# Patient Record
Sex: Female | Born: 1945 | Race: White | Hispanic: No | Marital: Married | Smoking: Never smoker
Health system: Southern US, Community
[De-identification: ages and names within clinical notes are randomized; demographics above are authoritative.]

## PROBLEM LIST (undated history)

## (undated) DIAGNOSIS — L719 Rosacea, unspecified: Secondary | ICD-10-CM

## (undated) DIAGNOSIS — M8580 Other specified disorders of bone density and structure, unspecified site: Secondary | ICD-10-CM

## (undated) DIAGNOSIS — K5790 Diverticulosis of intestine, part unspecified, without perforation or abscess without bleeding: Secondary | ICD-10-CM

## (undated) DIAGNOSIS — I080 Rheumatic disorders of both mitral and aortic valves: Secondary | ICD-10-CM

## (undated) DIAGNOSIS — Z8679 Personal history of other diseases of the circulatory system: Secondary | ICD-10-CM

## (undated) DIAGNOSIS — I43 Cardiomyopathy in diseases classified elsewhere: Secondary | ICD-10-CM

## (undated) DIAGNOSIS — F419 Anxiety disorder, unspecified: Secondary | ICD-10-CM

## (undated) DIAGNOSIS — G43909 Migraine, unspecified, not intractable, without status migrainosus: Secondary | ICD-10-CM

## (undated) DIAGNOSIS — E039 Hypothyroidism, unspecified: Secondary | ICD-10-CM

## (undated) DIAGNOSIS — R32 Unspecified urinary incontinence: Secondary | ICD-10-CM

## (undated) DIAGNOSIS — I4891 Unspecified atrial fibrillation: Secondary | ICD-10-CM

## (undated) DIAGNOSIS — I639 Cerebral infarction, unspecified: Secondary | ICD-10-CM

## (undated) DIAGNOSIS — T460X1A Poisoning by cardiac-stimulant glycosides and drugs of similar action, accidental (unintentional), initial encounter: Secondary | ICD-10-CM

## (undated) DIAGNOSIS — F32A Depression, unspecified: Secondary | ICD-10-CM

## (undated) DIAGNOSIS — R000 Tachycardia, unspecified: Principal | ICD-10-CM

## (undated) DIAGNOSIS — F329 Major depressive disorder, single episode, unspecified: Secondary | ICD-10-CM

## (undated) DIAGNOSIS — I5042 Chronic combined systolic (congestive) and diastolic (congestive) heart failure: Secondary | ICD-10-CM

## (undated) DIAGNOSIS — E785 Hyperlipidemia, unspecified: Secondary | ICD-10-CM

## (undated) DIAGNOSIS — I480 Paroxysmal atrial fibrillation: Secondary | ICD-10-CM

## (undated) DIAGNOSIS — J45909 Unspecified asthma, uncomplicated: Secondary | ICD-10-CM

## (undated) DIAGNOSIS — I4892 Unspecified atrial flutter: Secondary | ICD-10-CM

## (undated) HISTORY — DX: Diverticulosis of intestine, part unspecified, without perforation or abscess without bleeding: K57.90

## (undated) HISTORY — DX: Unspecified atrial fibrillation: I48.91

## (undated) HISTORY — DX: Unspecified urinary incontinence: R32

## (undated) HISTORY — DX: Unspecified asthma, uncomplicated: J45.909

## (undated) HISTORY — DX: Chronic combined systolic (congestive) and diastolic (congestive) heart failure: I50.42

## (undated) HISTORY — DX: Hyperlipidemia, unspecified: E78.5

## (undated) HISTORY — DX: Anxiety disorder, unspecified: F41.9

## (undated) HISTORY — DX: Hypothyroidism, unspecified: E03.9

## (undated) HISTORY — DX: Poisoning by cardiac-stimulant glycosides and drugs of similar action, accidental (unintentional), initial encounter: T46.0X1A

## (undated) HISTORY — PX: ABDOMINAL HYSTERECTOMY: SHX81

## (undated) HISTORY — DX: Other specified disorders of bone density and structure, unspecified site: M85.80

## (undated) HISTORY — DX: Tachycardia, unspecified: R00.0

## (undated) HISTORY — PX: THYROIDECTOMY, PARTIAL: SHX18

## (undated) HISTORY — DX: Paroxysmal atrial fibrillation: I48.0

## (undated) HISTORY — PX: TRANSTHORACIC ECHOCARDIOGRAM: SHX275

## (undated) HISTORY — PX: NM MYOVIEW LTD: HXRAD82

## (undated) HISTORY — DX: Depression, unspecified: F32.A

## (undated) HISTORY — DX: Cerebral infarction, unspecified: I63.9

## (undated) HISTORY — DX: Cardiomyopathy in diseases classified elsewhere: I43

## (undated) HISTORY — DX: Migraine, unspecified, not intractable, without status migrainosus: G43.909

## (undated) HISTORY — PX: LUMBAR DISC SURGERY: SHX700

## (undated) HISTORY — DX: Personal history of other diseases of the circulatory system: Z86.79

## (undated) HISTORY — DX: Major depressive disorder, single episode, unspecified: F32.9

## (undated) HISTORY — DX: Unspecified atrial flutter: I48.92

## (undated) HISTORY — DX: Rosacea, unspecified: L71.9

## (undated) HISTORY — DX: Rheumatic disorders of both mitral and aortic valves: I08.0

---

## 2003-08-30 DIAGNOSIS — I080 Rheumatic disorders of both mitral and aortic valves: Secondary | ICD-10-CM

## 2003-08-30 HISTORY — PX: MITRAL VALVE REPAIR: SHX2039

## 2003-08-30 HISTORY — PX: CARDIAC CATHETERIZATION: SHX172

## 2003-08-30 HISTORY — DX: Rheumatic disorders of both mitral and aortic valves: I08.0

## 2003-09-12 ENCOUNTER — Ambulatory Visit (HOSPITAL_COMMUNITY): Admission: RE | Admit: 2003-09-12 | Discharge: 2003-09-12 | Payer: Self-pay | Admitting: Cardiovascular Disease

## 2003-09-16 ENCOUNTER — Inpatient Hospital Stay (HOSPITAL_BASED_OUTPATIENT_CLINIC_OR_DEPARTMENT_OTHER): Admission: RE | Admit: 2003-09-16 | Discharge: 2003-09-16 | Payer: Self-pay | Admitting: Cardiology

## 2003-10-15 ENCOUNTER — Inpatient Hospital Stay (HOSPITAL_COMMUNITY)
Admission: RE | Admit: 2003-10-15 | Discharge: 2003-10-22 | Payer: Self-pay | Admitting: Thoracic Surgery (Cardiothoracic Vascular Surgery)

## 2003-10-15 ENCOUNTER — Encounter (INDEPENDENT_AMBULATORY_CARE_PROVIDER_SITE_OTHER): Payer: Self-pay | Admitting: *Deleted

## 2003-12-08 ENCOUNTER — Encounter (HOSPITAL_COMMUNITY): Admission: RE | Admit: 2003-12-08 | Discharge: 2004-03-07 | Payer: Self-pay | Admitting: Cardiology

## 2004-02-17 ENCOUNTER — Encounter
Admission: RE | Admit: 2004-02-17 | Discharge: 2004-02-17 | Payer: Self-pay | Admitting: Thoracic Surgery (Cardiothoracic Vascular Surgery)

## 2004-03-12 ENCOUNTER — Ambulatory Visit (HOSPITAL_COMMUNITY): Admission: RE | Admit: 2004-03-12 | Discharge: 2004-03-12 | Payer: Self-pay | Admitting: Internal Medicine

## 2004-10-05 ENCOUNTER — Ambulatory Visit: Payer: Self-pay

## 2004-11-04 ENCOUNTER — Ambulatory Visit: Payer: Self-pay | Admitting: Cardiology

## 2004-11-16 ENCOUNTER — Ambulatory Visit: Payer: Self-pay

## 2004-12-13 ENCOUNTER — Ambulatory Visit: Payer: Self-pay | Admitting: Cardiology

## 2005-08-15 ENCOUNTER — Ambulatory Visit: Payer: Self-pay | Admitting: Cardiology

## 2006-06-07 ENCOUNTER — Ambulatory Visit: Payer: Self-pay | Admitting: Cardiology

## 2006-06-30 ENCOUNTER — Ambulatory Visit: Payer: Self-pay

## 2006-06-30 ENCOUNTER — Encounter: Payer: Self-pay | Admitting: Internal Medicine

## 2006-08-24 ENCOUNTER — Ambulatory Visit: Payer: Self-pay

## 2006-09-08 ENCOUNTER — Ambulatory Visit: Payer: Self-pay | Admitting: Gastroenterology

## 2006-09-08 LAB — PROTIME-INR

## 2007-01-09 ENCOUNTER — Ambulatory Visit: Payer: Self-pay | Admitting: Cardiology

## 2007-07-23 ENCOUNTER — Ambulatory Visit: Payer: Self-pay | Admitting: Cardiology

## 2007-10-01 ENCOUNTER — Encounter: Payer: Self-pay | Admitting: Family Medicine

## 2007-10-12 ENCOUNTER — Ambulatory Visit: Payer: Self-pay | Admitting: Family Medicine

## 2008-07-04 ENCOUNTER — Ambulatory Visit: Payer: Self-pay | Admitting: Cardiology

## 2008-08-05 ENCOUNTER — Ambulatory Visit: Payer: Self-pay

## 2008-08-05 ENCOUNTER — Encounter: Payer: Self-pay | Admitting: Cardiology

## 2008-10-09 ENCOUNTER — Ambulatory Visit: Payer: Self-pay | Admitting: Cardiology

## 2008-11-18 ENCOUNTER — Ambulatory Visit: Payer: Self-pay | Admitting: Family Medicine

## 2008-11-18 DIAGNOSIS — R519 Headache, unspecified: Secondary | ICD-10-CM | POA: Insufficient documentation

## 2008-11-18 DIAGNOSIS — N3946 Mixed incontinence: Secondary | ICD-10-CM | POA: Insufficient documentation

## 2008-11-18 DIAGNOSIS — J45909 Unspecified asthma, uncomplicated: Secondary | ICD-10-CM | POA: Insufficient documentation

## 2008-11-18 DIAGNOSIS — F411 Generalized anxiety disorder: Secondary | ICD-10-CM | POA: Insufficient documentation

## 2008-11-18 DIAGNOSIS — Z87442 Personal history of urinary calculi: Secondary | ICD-10-CM | POA: Insufficient documentation

## 2008-11-18 DIAGNOSIS — I4891 Unspecified atrial fibrillation: Secondary | ICD-10-CM | POA: Insufficient documentation

## 2008-11-18 DIAGNOSIS — E039 Hypothyroidism, unspecified: Secondary | ICD-10-CM | POA: Insufficient documentation

## 2008-11-18 DIAGNOSIS — R51 Headache: Secondary | ICD-10-CM | POA: Insufficient documentation

## 2008-11-18 DIAGNOSIS — F331 Major depressive disorder, recurrent, moderate: Secondary | ICD-10-CM | POA: Insufficient documentation

## 2008-11-25 ENCOUNTER — Ambulatory Visit: Payer: Self-pay | Admitting: Professional

## 2008-12-02 ENCOUNTER — Ambulatory Visit: Payer: Self-pay | Admitting: Family Medicine

## 2008-12-02 LAB — CONVERTED CEMR LAB
INR: 1.2
Prothrombin Time: 13.8 s

## 2008-12-08 ENCOUNTER — Ambulatory Visit: Payer: Self-pay | Admitting: Family Medicine

## 2008-12-08 LAB — CONVERTED CEMR LAB: Prothrombin Time: 20 s

## 2008-12-09 ENCOUNTER — Ambulatory Visit: Payer: Self-pay | Admitting: Professional

## 2008-12-15 ENCOUNTER — Ambulatory Visit: Payer: Self-pay | Admitting: Family Medicine

## 2008-12-15 LAB — CONVERTED CEMR LAB: INR: 1.7

## 2008-12-16 ENCOUNTER — Ambulatory Visit: Payer: Self-pay | Admitting: Professional

## 2008-12-22 ENCOUNTER — Ambulatory Visit: Payer: Self-pay | Admitting: Family Medicine

## 2008-12-22 LAB — CONVERTED CEMR LAB: INR: 2.9

## 2008-12-25 ENCOUNTER — Encounter (INDEPENDENT_AMBULATORY_CARE_PROVIDER_SITE_OTHER): Payer: Self-pay | Admitting: *Deleted

## 2008-12-29 ENCOUNTER — Ambulatory Visit: Payer: Self-pay | Admitting: Family Medicine

## 2008-12-30 LAB — CONVERTED CEMR LAB
INR: 4.1 — ABNORMAL HIGH (ref 0.0–1.5)
Prothrombin Time: 43.5 s — ABNORMAL HIGH (ref 11.6–15.2)

## 2009-01-05 ENCOUNTER — Ambulatory Visit: Payer: Self-pay | Admitting: Family Medicine

## 2009-01-05 LAB — CONVERTED CEMR LAB
INR: 1.7
Prothrombin Time: 16.3 s

## 2009-01-12 ENCOUNTER — Ambulatory Visit: Payer: Self-pay | Admitting: Family Medicine

## 2009-01-12 LAB — CONVERTED CEMR LAB: Prothrombin Time: 16.9 s

## 2009-01-20 ENCOUNTER — Ambulatory Visit: Payer: Self-pay | Admitting: Family Medicine

## 2009-01-20 DIAGNOSIS — IMO0002 Reserved for concepts with insufficient information to code with codable children: Secondary | ICD-10-CM | POA: Insufficient documentation

## 2009-01-28 ENCOUNTER — Telehealth: Payer: Self-pay | Admitting: Family Medicine

## 2009-01-28 ENCOUNTER — Ambulatory Visit: Payer: Self-pay | Admitting: Family Medicine

## 2009-01-28 LAB — CONVERTED CEMR LAB: INR: 2.9

## 2009-02-04 ENCOUNTER — Telehealth: Payer: Self-pay | Admitting: Family Medicine

## 2009-02-10 ENCOUNTER — Ambulatory Visit: Payer: Self-pay | Admitting: Professional

## 2009-02-13 ENCOUNTER — Encounter: Payer: Self-pay | Admitting: Family Medicine

## 2009-02-13 ENCOUNTER — Ambulatory Visit: Payer: Self-pay | Admitting: Family Medicine

## 2009-02-16 ENCOUNTER — Encounter (INDEPENDENT_AMBULATORY_CARE_PROVIDER_SITE_OTHER): Payer: Self-pay | Admitting: *Deleted

## 2009-02-24 ENCOUNTER — Ambulatory Visit: Payer: Self-pay | Admitting: Family Medicine

## 2009-03-03 ENCOUNTER — Ambulatory Visit: Payer: Self-pay | Admitting: Family Medicine

## 2009-03-03 LAB — CONVERTED CEMR LAB
INR: 1.2
Prothrombin Time: 13.8 s

## 2009-03-10 ENCOUNTER — Ambulatory Visit: Payer: Self-pay | Admitting: Family Medicine

## 2009-03-10 LAB — CONVERTED CEMR LAB
INR: 2.2
INR: 2.6 — ABNORMAL HIGH (ref 0.8–1.0)
Prothrombin Time: 18.1 s
Prothrombin Time: 26.5 s — ABNORMAL HIGH (ref 10.9–13.3)

## 2009-03-16 ENCOUNTER — Telehealth: Payer: Self-pay | Admitting: Family Medicine

## 2009-04-06 ENCOUNTER — Ambulatory Visit: Payer: Self-pay | Admitting: Family Medicine

## 2009-04-14 ENCOUNTER — Telehealth: Payer: Self-pay | Admitting: Family Medicine

## 2009-04-27 ENCOUNTER — Ambulatory Visit: Payer: Self-pay | Admitting: Family Medicine

## 2009-05-06 ENCOUNTER — Encounter (INDEPENDENT_AMBULATORY_CARE_PROVIDER_SITE_OTHER): Payer: Self-pay | Admitting: *Deleted

## 2009-05-07 ENCOUNTER — Ambulatory Visit: Payer: Self-pay | Admitting: Family Medicine

## 2009-05-14 ENCOUNTER — Encounter (INDEPENDENT_AMBULATORY_CARE_PROVIDER_SITE_OTHER): Payer: Self-pay | Admitting: *Deleted

## 2009-05-14 LAB — CONVERTED CEMR LAB
ALT: 25 units/L (ref 0–35)
AST: 25 units/L (ref 0–37)
Alkaline Phosphatase: 61 units/L (ref 39–117)
Basophils Absolute: 0 10*3/uL (ref 0.0–0.1)
Calcium: 9.4 mg/dL (ref 8.4–10.5)
Eosinophils Relative: 2.3 % (ref 0.0–5.0)
GFR calc non Af Amer: 76.6 mL/min (ref >60)
HCT: 40.3 % (ref 36.0–46.0)
HDL: 61.2 mg/dL (ref >39.00)
Hemoglobin: 13.7 g/dL (ref 12.0–15.0)
Lymphocytes Relative: 36 % (ref 12.0–46.0)
Lymphs Abs: 1.4 10*3/uL (ref 0.7–4.0)
Monocytes Relative: 7.6 % (ref 3.0–12.0)
Neutro Abs: 2.2 10*3/uL (ref 1.4–7.7)
Platelets: 202 10*3/uL (ref 150.0–400.0)
Potassium: 4.7 meq/L (ref 3.5–5.1)
RDW: 12.5 % (ref 11.5–14.6)
Sodium: 141 meq/L (ref 135–145)
TSH: 1.49 microintl units/mL (ref 0.35–5.50)
Total Bilirubin: 0.9 mg/dL (ref 0.3–1.2)
Total CHOL/HDL Ratio: 3
Triglycerides: 80 mg/dL (ref 0.0–149.0)
VLDL: 16 mg/dL (ref 0.0–40.0)
WBC: 4 10*3/uL — ABNORMAL LOW (ref 4.5–10.5)

## 2009-05-15 ENCOUNTER — Ambulatory Visit: Payer: Self-pay | Admitting: Family Medicine

## 2009-05-15 LAB — CONVERTED CEMR LAB: Prothrombin Time: 24.4 s

## 2009-05-22 ENCOUNTER — Ambulatory Visit: Payer: Self-pay | Admitting: Family Medicine

## 2009-05-22 LAB — CONVERTED CEMR LAB: INR: 3.3

## 2009-06-05 ENCOUNTER — Ambulatory Visit: Payer: Self-pay | Admitting: Family Medicine

## 2009-06-05 LAB — CONVERTED CEMR LAB
INR: 1.7
Prothrombin Time: 15.9 s

## 2009-07-03 ENCOUNTER — Ambulatory Visit: Payer: Self-pay | Admitting: Family Medicine

## 2009-07-27 ENCOUNTER — Ambulatory Visit: Payer: Self-pay | Admitting: Cardiology

## 2009-07-27 DIAGNOSIS — I058 Other rheumatic mitral valve diseases: Secondary | ICD-10-CM | POA: Insufficient documentation

## 2009-07-31 ENCOUNTER — Ambulatory Visit: Payer: Self-pay | Admitting: Family Medicine

## 2009-09-07 ENCOUNTER — Ambulatory Visit: Payer: Self-pay | Admitting: Family Medicine

## 2009-09-07 LAB — CONVERTED CEMR LAB: INR: 2.1

## 2009-09-09 ENCOUNTER — Telehealth: Payer: Self-pay | Admitting: Family Medicine

## 2009-09-17 ENCOUNTER — Ambulatory Visit: Payer: Self-pay | Admitting: Family Medicine

## 2009-09-18 ENCOUNTER — Telehealth: Payer: Self-pay | Admitting: Family Medicine

## 2009-09-18 LAB — CONVERTED CEMR LAB
Free T4: 0.9 ng/dL (ref 0.6–1.6)
T4, Total: 8.1 ug/dL (ref 5.0–12.5)
TSH: 1.88 microintl units/mL (ref 0.35–5.50)

## 2009-09-23 ENCOUNTER — Ambulatory Visit: Payer: Self-pay | Admitting: Family Medicine

## 2009-10-05 ENCOUNTER — Ambulatory Visit: Payer: Self-pay | Admitting: Family Medicine

## 2009-10-05 LAB — CONVERTED CEMR LAB
INR: 2
Prothrombin Time: 17.5 s

## 2009-10-19 ENCOUNTER — Telehealth (INDEPENDENT_AMBULATORY_CARE_PROVIDER_SITE_OTHER): Payer: Self-pay | Admitting: *Deleted

## 2009-11-02 ENCOUNTER — Ambulatory Visit: Payer: Self-pay | Admitting: Family Medicine

## 2009-11-09 ENCOUNTER — Telehealth: Payer: Self-pay | Admitting: Cardiology

## 2009-11-12 ENCOUNTER — Ambulatory Visit: Payer: Self-pay | Admitting: Family Medicine

## 2009-11-26 ENCOUNTER — Ambulatory Visit: Payer: Self-pay | Admitting: Family Medicine

## 2009-12-09 ENCOUNTER — Ambulatory Visit: Payer: Self-pay | Admitting: Family Medicine

## 2009-12-10 ENCOUNTER — Telehealth: Payer: Self-pay | Admitting: Family Medicine

## 2009-12-16 ENCOUNTER — Ambulatory Visit: Payer: Self-pay | Admitting: Family Medicine

## 2009-12-16 ENCOUNTER — Telehealth: Payer: Self-pay | Admitting: Family Medicine

## 2009-12-16 LAB — CONVERTED CEMR LAB
INR: 3.1
Prothrombin Time: 37.8 s

## 2009-12-30 ENCOUNTER — Ambulatory Visit: Payer: Self-pay | Admitting: Family Medicine

## 2009-12-30 LAB — CONVERTED CEMR LAB
INR: 2.5
Prothrombin Time: 29.9 s

## 2010-01-04 ENCOUNTER — Ambulatory Visit: Payer: Self-pay | Admitting: Cardiology

## 2010-01-06 ENCOUNTER — Telehealth: Payer: Self-pay | Admitting: Family Medicine

## 2010-01-14 ENCOUNTER — Ambulatory Visit: Payer: Self-pay | Admitting: Family Medicine

## 2010-01-14 ENCOUNTER — Telehealth: Payer: Self-pay | Admitting: Family Medicine

## 2010-01-15 ENCOUNTER — Telehealth: Payer: Self-pay | Admitting: Family Medicine

## 2010-01-18 ENCOUNTER — Telehealth: Payer: Self-pay | Admitting: Family Medicine

## 2010-01-19 ENCOUNTER — Encounter: Payer: Self-pay | Admitting: Family Medicine

## 2010-01-27 ENCOUNTER — Ambulatory Visit: Payer: Self-pay | Admitting: Family Medicine

## 2010-01-27 LAB — CONVERTED CEMR LAB
INR: 1.7
Prothrombin Time: 20.4 s

## 2010-02-10 ENCOUNTER — Ambulatory Visit: Payer: Self-pay | Admitting: Family Medicine

## 2010-02-10 LAB — CONVERTED CEMR LAB: INR: 1.5

## 2010-02-24 ENCOUNTER — Ambulatory Visit: Payer: Self-pay | Admitting: Family Medicine

## 2010-02-24 LAB — CONVERTED CEMR LAB: INR: 2.1

## 2010-03-02 ENCOUNTER — Telehealth: Payer: Self-pay | Admitting: Family Medicine

## 2010-03-24 ENCOUNTER — Telehealth: Payer: Self-pay | Admitting: Family Medicine

## 2010-03-24 ENCOUNTER — Ambulatory Visit: Payer: Self-pay | Admitting: Family Medicine

## 2010-03-25 ENCOUNTER — Telehealth: Payer: Self-pay | Admitting: Family Medicine

## 2010-03-30 ENCOUNTER — Telehealth: Payer: Self-pay | Admitting: Family Medicine

## 2010-03-31 ENCOUNTER — Ambulatory Visit: Payer: Self-pay | Admitting: Family Medicine

## 2010-03-31 DIAGNOSIS — R5381 Other malaise: Secondary | ICD-10-CM | POA: Insufficient documentation

## 2010-03-31 DIAGNOSIS — R5383 Other fatigue: Secondary | ICD-10-CM

## 2010-04-02 LAB — CONVERTED CEMR LAB
BUN: 13 mg/dL (ref 6–23)
Bilirubin, Direct: 0.1 mg/dL (ref 0.0–0.3)
Calcium: 9.3 mg/dL (ref 8.4–10.5)
Chloride: 104 meq/L (ref 96–112)
Cholesterol: 217 mg/dL — ABNORMAL HIGH (ref 0–200)
Creatinine, Ser: 0.8 mg/dL (ref 0.4–1.2)
Direct LDL: 132.3 mg/dL
Eosinophils Absolute: 0.1 10*3/uL (ref 0.0–0.7)
Eosinophils Relative: 1 % (ref 0.0–5.0)
HDL: 47.9 mg/dL (ref >39.00)
Lymphocytes Relative: 23.5 % (ref 12.0–46.0)
MCV: 98.5 fL (ref 78.0–100.0)
Monocytes Absolute: 0.3 10*3/uL (ref 0.1–1.0)
Neutrophils Relative %: 69.9 % (ref 43.0–77.0)
Platelets: 199 10*3/uL (ref 150.0–400.0)
T4, Total: 7 ug/dL (ref 5.0–12.5)
Total Bilirubin: 0.9 mg/dL (ref 0.3–1.2)
Triglycerides: 197 mg/dL — ABNORMAL HIGH (ref 0.0–149.0)
VLDL: 39.4 mg/dL (ref 0.0–40.0)
Vit D, 25-Hydroxy: 24 ng/mL — ABNORMAL LOW (ref 30–89)
WBC: 5.6 10*3/uL (ref 4.5–10.5)

## 2010-04-05 ENCOUNTER — Encounter (INDEPENDENT_AMBULATORY_CARE_PROVIDER_SITE_OTHER): Payer: Self-pay | Admitting: *Deleted

## 2010-04-05 ENCOUNTER — Ambulatory Visit: Payer: Self-pay | Admitting: Family Medicine

## 2010-04-05 DIAGNOSIS — E785 Hyperlipidemia, unspecified: Secondary | ICD-10-CM

## 2010-04-05 DIAGNOSIS — K219 Gastro-esophageal reflux disease without esophagitis: Secondary | ICD-10-CM | POA: Insufficient documentation

## 2010-04-05 HISTORY — DX: Hyperlipidemia, unspecified: E78.5

## 2010-04-06 ENCOUNTER — Ambulatory Visit: Payer: Self-pay | Admitting: Family Medicine

## 2010-04-06 ENCOUNTER — Encounter: Payer: Self-pay | Admitting: Family Medicine

## 2010-04-13 ENCOUNTER — Telehealth: Payer: Self-pay | Admitting: Family Medicine

## 2010-04-21 ENCOUNTER — Ambulatory Visit: Payer: Self-pay | Admitting: Family Medicine

## 2010-04-21 LAB — CONVERTED CEMR LAB: Prothrombin Time: 20.5 s

## 2010-04-30 ENCOUNTER — Telehealth: Payer: Self-pay | Admitting: Family Medicine

## 2010-05-05 ENCOUNTER — Ambulatory Visit: Payer: Self-pay | Admitting: Family Medicine

## 2010-05-05 LAB — CONVERTED CEMR LAB: Prothrombin Time: 14.6 s

## 2010-05-12 ENCOUNTER — Ambulatory Visit: Payer: Self-pay | Admitting: Family Medicine

## 2010-05-19 ENCOUNTER — Ambulatory Visit: Payer: Self-pay | Admitting: Family Medicine

## 2010-05-20 ENCOUNTER — Telehealth: Payer: Self-pay | Admitting: Family Medicine

## 2010-05-24 ENCOUNTER — Telehealth: Payer: Self-pay | Admitting: Family Medicine

## 2010-06-03 ENCOUNTER — Telehealth: Payer: Self-pay | Admitting: Family Medicine

## 2010-06-16 ENCOUNTER — Ambulatory Visit: Payer: Self-pay | Admitting: Family Medicine

## 2010-06-16 LAB — CONVERTED CEMR LAB
INR: 3.2
Prothrombin Time: 38.1 s

## 2010-06-18 ENCOUNTER — Telehealth: Payer: Self-pay | Admitting: Family Medicine

## 2010-06-29 ENCOUNTER — Telehealth (INDEPENDENT_AMBULATORY_CARE_PROVIDER_SITE_OTHER): Payer: Self-pay | Admitting: *Deleted

## 2010-06-30 ENCOUNTER — Ambulatory Visit: Payer: Self-pay | Admitting: Family Medicine

## 2010-06-30 DIAGNOSIS — R300 Dysuria: Secondary | ICD-10-CM | POA: Insufficient documentation

## 2010-06-30 LAB — CONVERTED CEMR LAB
Glucose, Urine, Semiquant: NEGATIVE
INR: 2.5
Specific Gravity, Urine: 1.015
pH: 5

## 2010-07-01 ENCOUNTER — Encounter: Payer: Self-pay | Admitting: Family Medicine

## 2010-07-01 ENCOUNTER — Telehealth: Payer: Self-pay | Admitting: Family Medicine

## 2010-07-09 ENCOUNTER — Ambulatory Visit: Payer: Self-pay | Admitting: Family Medicine

## 2010-07-20 ENCOUNTER — Ambulatory Visit: Payer: Self-pay | Admitting: Psychology

## 2010-08-03 ENCOUNTER — Ambulatory Visit: Payer: Self-pay | Admitting: Psychology

## 2010-08-05 ENCOUNTER — Telehealth: Payer: Self-pay | Admitting: Family Medicine

## 2010-08-06 ENCOUNTER — Ambulatory Visit: Payer: Self-pay | Admitting: Family Medicine

## 2010-08-06 LAB — CONVERTED CEMR LAB: INR: 2.3

## 2010-08-09 ENCOUNTER — Encounter (INDEPENDENT_AMBULATORY_CARE_PROVIDER_SITE_OTHER): Payer: Self-pay | Admitting: *Deleted

## 2010-08-09 ENCOUNTER — Telehealth (INDEPENDENT_AMBULATORY_CARE_PROVIDER_SITE_OTHER): Payer: Self-pay | Admitting: *Deleted

## 2010-08-11 ENCOUNTER — Ambulatory Visit: Payer: Self-pay | Admitting: Psychology

## 2010-08-24 ENCOUNTER — Telehealth: Payer: Self-pay | Admitting: Family Medicine

## 2010-08-24 ENCOUNTER — Ambulatory Visit: Payer: Self-pay | Admitting: Psychology

## 2010-08-25 ENCOUNTER — Telehealth: Payer: Self-pay | Admitting: Cardiology

## 2010-09-01 ENCOUNTER — Ambulatory Visit
Admission: RE | Admit: 2010-09-01 | Discharge: 2010-09-01 | Payer: Self-pay | Source: Home / Self Care | Attending: Family Medicine | Admitting: Family Medicine

## 2010-09-01 LAB — CONVERTED CEMR LAB
INR: 2.7
Prothrombin Time: 32.7 s

## 2010-09-08 ENCOUNTER — Ambulatory Visit
Admission: RE | Admit: 2010-09-08 | Discharge: 2010-09-08 | Payer: Self-pay | Source: Home / Self Care | Attending: Psychiatry | Admitting: Psychiatry

## 2010-09-28 NOTE — Progress Notes (Signed)
Summary: refill request for mirtazapine  Phone Note Refill Request Message from:  Fax from Pharmacy  Refills Requested: Medication #1:  MIRTAZAPINE 45 MG TABS 1 by mouth at bedtime   Last Refilled: 12/14/2009 Faxed request from [facility], [phone].  Initial call taken by: Lowella Petties CMA,  Jan 06, 2010 11:11 AM    Prescriptions: MIRTAZAPINE 45 MG TABS (MIRTAZAPINE) 1 by mouth at bedtime  #30 x 5   Entered and Authorized by:   [name] MD   Signed by:   [name] MD on 01/06/2010   Method used:   Electronically to        [facility]* (retail)       [address] [place], [place]  [address]       Ph: [phone]       Fax: [phone]   RxID:   [id number]

## 2010-09-28 NOTE — Progress Notes (Signed)
Summary: Something for nerves  Phone Note Call from Patient   Caller: Patient Call For: [name] MD Summary of Call: Patient called today says she is having a very difficult time dealing with her mom.  They are in the process of trying to get her into a facility.  Patient says she is fine at night but during the day she has problems "holding it together."  Says she was in a deep depression last year and doesn't want to get that way again.  She wants something to help her get through the daytime.  She is on Clonazepam 1mg , 1/4 tablet daily, Mirtazapine 45mg , one at bedtime, and Ambien 10mg , one at bedtime and they ard doing ok for her at nightime.  Has an appt to see you 09/17/09 but would like something until she sees you on that day Initial call taken by: Linde Gillis CMA [name]),  September 09, 2009 12:08 PM  Follow-up for Phone Call        For now, ok to take clonazepam, 1/2 tab up to 1 tab by mouth two times a day for now until I see her.  OK to refill, #30 if she is out, 1 mg tabs Follow-up by: [name] MD,  September 09, 2009 12:16 PM  Additional Follow-up for Phone Call Additional follow up Details #1::        rx called to pharamcy Additional Follow-up by: Benny Lennert CMA [name]),  September 09, 2009 12:19 PM

## 2010-09-28 NOTE — Letter (Signed)
Summary: Controlled Substances Contract  [place] [facility]  [address] [place], [place] [address]   Phone: [phone]  Fax: [phone]    [facility] Controlled Substances Contract         Patient Name: [name] Patient DOB: 1946/01/11        Patient MRN:  [id number]        Physician's Name: _________________________________________   Patients must complete this contract before doctors at the [facility] office will be willing to prescribe controlled substances. I understand that: ___1)  I am responsible for my controlled substance medications.  If my prescription is lost, misplaced or stolen, or if I take more than prescribed, my doctor will not write me a new prescription. ___2)  I will not request or accept controlled substances or controlled substance prescriptions from any other doctor or clinic while I am receiving controlled substance treatment at [facility].  The ONLY exception is if controlled substances are prescribed for the treatment of an acute condition that is NOT the diagnosis for which I am receiving treatment at [facility].   I will call my physician at [facility] if I receive controlled substance or controlled substance prescriptions from anywhere else. ___3)  Controlled substance refills will be made ONLY during regular office hours. ___4)  Refills will not be made if I run out early.  Refills will not be made during work-in or urgent care visits.  Refills will NOT be made for "emergencies", such as on a Friday afternoon or by on call service at night or weekends.  I understand that I am required to call at least 2 business days prior to expiration date for controlled substance and/or needing controlled substance refills.   ___5)  I will not use illicit (illegal) drugs.  ___6)  I agree to take urine or blood drug tests when requested for routine screening. ___7)  I agree to use only ONE pharmacy for  filling ALL my controlled substance prescriptions.             Name and Location of Pharmacy:                                                                                                                  ___8)  I understand that my doctor may review my use of controlled substances using the [facility] Controlled Substance Reporting System. ___9)  If I behave in an abusive way towards [facility] staff, my controlled substance prescriptions may be stopped, and I may be dismissed from this practice. ___10)  I understand that if I break any of the above terms of this contract, my pain prescription and/or treatment may be stopped immediately.  If I get controlled substances from someone else or use illegal drugs, I may be reported to all my doctors, medical facilities and appropriate authorities.  I have been fully informed by [facility] physicians and the staff regarding psychological dependence (addiction)  to controlled substances.  I understand that I should stop my medication ONLY under medical supervision or I may have withdrawal symptoms.  ***I have read this contract and it has been explained to me by [facility] physicians and/or their staff, and I fully understand the consequences of violating any of the terms of this contract.    Patient Signature _________________________________________ Date April 05, 2010   [facility] Staff Signature ____________________________________ Date April 05, 2010

## 2010-09-28 NOTE — Progress Notes (Signed)
Summary: zofran  Phone Note Refill Request Message from:  Fax from Pharmacy on March 02, 2010 10:34 AM  Refills Requested: Medication #1:  ONDANSETRON 4 MG TBDP 1 by mouth under tongue as needed nausea q 6 hours as needed.   Supply Requested: 1 month   Last Refilled: 01/18/2010 [facility] [phone]   Method Requested: Electronic Initial call taken by: Benny Lennert CMA [name]),  March 02, 2010 10:35 AM    Prescriptions: ONDANSETRON 4 MG TBDP (ONDANSETRON) 1 by mouth under tongue as needed nausea q 6 hours as needed  #30 x 0   Entered and Authorized by:   [name] MD   Signed by:   [name] MD on 03/02/2010   Method used:   Electronically to        [facility]* (retail)       [address] [place], [place]  [address]       Ph: [phone]       Fax: [phone]   RxID:   [id number]

## 2010-09-28 NOTE — Progress Notes (Signed)
Summary: promethazine  Phone Note Refill Request Message from:  Fax from Pharmacy on July 01, 2010 2:04 PM  Refills Requested: Medication #1:  PROMETHAZINE HCL 25 MG TABS 1 tab by mouth q6 hours as needed nausea   Supply Requested: 1 month  Medication #2:  CLONAZEPAM 1 MG TABS Up to 1 by mouth two times a day as needed anxiety   Supply Requested: 1 month [facility]   Method Requested: Electronic Initial call taken by: Benny Lennert CMA [name]),  July 01, 2010 2:05 PM  Follow-up for Phone Call        Rx called to pharmacy Follow-up by: Benny Lennert CMA [name]),  July 01, 2010 2:42 PM    Prescriptions: PROMETHAZINE HCL 25 MG TABS (PROMETHAZINE HCL) 1 tab by mouth q6 hours as needed nausea  #30 x 1   Entered and Authorized by:   [name] MD   Signed by:   [name] MD on 07/01/2010   Method used:   Telephoned to ...       [facility]* (retail)       [address] [place], [place]  [address]       Ph: [phone]       Fax: [phone]   RxID:   [id number] CLONAZEPAM 1 MG TABS (CLONAZEPAM) Up to 1 by mouth two times a day as needed anxiety  #60 x 1   Entered and Authorized by:   [name] MD   Signed by:   [name] MD on 07/01/2010   Method used:   Telephoned to ...       [facility]* (retail)       [address] [place], [place]  [address]       Ph: [phone]       Fax: [phone]   RxID:   [id number]

## 2010-09-28 NOTE — Assessment & Plan Note (Signed)
Summary: uti/hmw   Vital Signs:  Patient profile:   65 year old [name] Height:      68 inches Weight:      148.0 pounds BMI:     22.58 Temp:     98.6 degrees F oral Pulse rate:   76 / minute Pulse rhythm:   regular BP sitting:   120 / 78  (left arm) Cuff size:   regular  Vitals Entered By: Benny Lennert CMA [name]) (June 30, 2010 1:39 PM)  History of Present Illness: Chief complaint ? UTI  65 year old [name]:  after intercourse, started to feel some symptoms.  2 weeks ago dysuria, urgency no blood in urine   mood better, but still not stable. Has f/u appt with Dr. Omelia Blackwater coming up.  ROS: no fever, chills, sweats.  GEN: WDWN, NAD, Non-toxic, A & O x 3 HEENT: Atraumatic, Normocephalic. Neck supple. No masses, No LAD. Ears and Nose: No external deformity. ABD: S, NT, ND, +BS. No rebound tenderness. No HSM.  EXTR: No c/c/e NEURO: Normal gait.  PSYCH: Normally interactive. Conversant. Not depressed or anxious appearing.  Calm demeanor.    Allergies (verified): No Known Drug Allergies   Impression & Recommendations:  Problem # 1:  DYSURIA (ICD-788.1) dysuria, on AZO - UA inaccurate.  culture probable UTI  The following medications were removed from the medication list:    Utrona-c 81.6 Mg Tabs (Meth-hyo-m bl-na phos-ph sal) .Marland Kitchen... As needed for urinary tract infections. Her updated medication list for this problem includes:    Uribel 118 Mg Caps (Meth-hyo-m bl-na phos-ph sal) .Marland Kitchen... As needed for uti    Nitrofurantoin Monohyd Macro 100 Mg Caps (Nitrofurantoin monohyd macro) .Marland Kitchen... 1 by mouth two times a day  Orders: T-Culture, Urine ([address]) UA Dipstick w/o Micro (manual) ([address])  Complete Medication List: 1)  Clonazepam 1 Mg Tabs (Clonazepam) .... Up to 1 by mouth two times a day as needed anxiety 2)  Ambien 10 Mg Tabs (Zolpidem tartrate) .... Take one by mouth at bedtime 3)  Toprol Xl 25 Mg Xr24h-tab (Metoprolol succinate) .... Take one by mouth  daily 4)  Coumadin 10 Mg Tabs (Warfarin sodium) .... Take one by mouth daily as directed 5)  Synthroid 50 Mcg Tabs (Levothyroxine sodium) .... Take one by mouth daily 6)  Mirtazapine 45 Mg Tabs (Mirtazapine) .Marland Kitchen.. 1 by mouth at bedtime 7)  Warfarin Sodium 1 Mg Tabs (Warfarin sodium) .... Use as directed. 8)  Fioricet 50-325-40 Mg Tabs (Butalbital-apap-caffeine) .... As needed migraines 9)  Alavert 10 Mg Tabs (Loratadine) .... As needed 10)  Finacea 15 % Gel (Azelaic acid) .... Two times daily 11)  Coumadin 5 Mg Tabs (Warfarin sodium) .... One tab by mouth as directed 12)  Coumadin 1 Mg Tabs (Warfarin sodium) .... Take by mouth  as directed 13)  Lansoprazole 30 Mg Cpdr (Lansoprazole) .Marland Kitchen.. 1 by mouth daily (failure prilosec) 14)  Bupropion Hcl 150 Mg Xr24h-tab (Bupropion hcl) .Marland Kitchen.. 1 by mouth daily 15)  Ondansetron 4 Mg Tbdp (Ondansetron) .Marland Kitchen.. 1 by mouth under tongue as needed nausea q 6 hours as needed 16)  Promethazine Hcl 25 Mg Tabs (Promethazine hcl) .Marland Kitchen.. 1 tab by mouth q6 hours as needed nausea 17)  Coumadin 3 Mg Tabs (Warfarin sodium) .... Use as directed 18)  Uribel 118 Mg Caps (Meth-hyo-m bl-na phos-ph sal) .... As needed for uti 19)  Nitrofurantoin Monohyd Macro 100 Mg Caps (Nitrofurantoin monohyd macro) .Marland Kitchen.. 1 by mouth two times a day  Other  Orders: Admin 1st Vaccine (19147) Flu Vaccine 85yrs + [phone]) Prescriptions: NITROFURANTOIN MONOHYD MACRO 100 MG CAPS (NITROFURANTOIN MONOHYD MACRO) 1 by mouth two times a day  #14 x 0   Entered and Authorized by:   [name] MD   Signed by:   [name] MD on 06/30/2010   Method used:   Electronically to        [facility]* (retail)       [address] [place], [place]  [address]       Ph: [phone]       Fax: [phone]   RxID:   [id number]    Orders Added: 1)  Admin 1st Vaccine [90471] 2)  Flu Vaccine 76yrs + [25956] 3)  T-Culture, Urine [[address]-43329] 4)  Est. Patient Level III  [51884] 5)  UA Dipstick w/o Micro (manual) [81002]    Current Allergies (reviewed today): No known allergies                         Flu Vaccine Consent Questions     Do you have a history of severe allergic reactions to this vaccine? no    Any prior history of allergic reactions to egg and/or gelatin? no    Do you have a sensitivity to the preservative Thimersol? no    Do you have a past history of Guillan-Barre Syndrome? no    Do you currently have an acute febrile illness? no    Have you ever had a severe reaction to latex? no    Vaccine information given and explained to patient? yes    Are you currently pregnant? no    Lot Number:[id number]   Exp Date:02/26/2011   Site Given  Left Deltoid IM .lbflu1    Laboratory Results   Urine Tests  Date/Time Received: June 30, 2010 1:50 PM  Date/Time Reported: June 30, 2010 1:51 PM   Routine Urinalysis   Color: green Appearance: Clear Glucose: negative   (Normal Range: Negative) Bilirubin: negative   (Normal Range: Negative) Ketone: negative   (Normal Range: Negative) Spec. Gravity: 1.015   (Normal Range: 1.003-1.035) Blood: trace-lysed   (Normal Range: Negative) pH: 5.0   (Normal Range: 5.0-8.0) Protein: negative   (Normal Range: Negative) Urobilinogen: 0.2   (Normal Range: 0-1) Nitrite: negative   (Normal Range: Negative) Leukocyte Esterace: trace   (Normal Range: Negative)       Appended Document: uti/hmw

## 2010-09-28 NOTE — Assessment & Plan Note (Signed)
Summary: 6 month rov due after 01/24/2010 pfh,rn  Medications Added PREVACID 15 MG CPDR (LANSOPRAZOLE) 1 by mouth daily      Allergies Added: NKDA  Visit Type:  Follow-up Primary Provider:  [name] MD  CC:  MVR and Atrial Fibrillation.  History of Present Illness: Since I last saw the patient she has had no new cardiovascular complaints. She is most bothered by depression and is following closely with her primary physician for management of this. She is not having any shortness of breath, PND or orthopnea. She has had some palpitations that she believes is her atrial fibrillation. However, these have been short-lived. She's had no presyncope or syncope. She's had no weight gain or swelling. She did have a severe GI illness recently and despite this had no acute cardiovascular complaints.  Current Medications (verified): 1)  Clonazepam 1 Mg Tabs (Clonazepam) .... Up To 1 By Mouth Two Times A Day As Needed Anxiety 2)  Ambien 10 Mg Tabs (Zolpidem Tartrate) .... Take One By Mouth At Bedtime 3)  Toprol Xl 25 Mg Xr24h-Tab (Metoprolol Succinate) .... Take One By Mouth Daily 4)  Coumadin 10 Mg Tabs (Warfarin Sodium) .... Take One By Mouth Daily As Directed 5)  Synthroid 50 Mcg Tabs (Levothyroxine Sodium) .... Take One By Mouth Daily 6)  Mirtazapine 45 Mg Tabs (Mirtazapine) .Marland Kitchen.. 1 By Mouth At Bedtime 7)  Warfarin Sodium 1 Mg Tabs (Warfarin Sodium) .... Use As Directed. 8)  Utrona-C 81.6 Mg Tabs (Meth-Hyo-M Bl-Na Phos-Ph Sal) .... As Needed For Urinary Tract Infections. 9)  Fioricet 50-325-40 Mg Tabs (Butalbital-Apap-Caffeine) .... As Needed Migraines 10)  Alavert 10 Mg Tabs (Loratadine) .... As Needed 11)  Finacea 15 % Gel (Azelaic Acid) .... Two Times Daily 12)  Coumadin 5 Mg Tabs (Warfarin Sodium) .... One Tab By Mouth As Directed 13)  Coumadin 1 Mg Tabs (Warfarin Sodium) .... Take By Mouth  As Directed 14)  Prevacid 15 Mg Cpdr (Lansoprazole) .Marland Kitchen.. 1 By Mouth Daily  Allergies  (verified): No Known Drug Allergies  Past History:  Past Medical History: Reviewed history from 12/16/2008 and no changes required. Rheumatic Fever as child Mitral regurg, s/p MV repair CVA, h/o superior cerebellar infarct Atrial fibrillation, s/p cardioversion Hypothyroidism Nephrolithiasis, hx of Asthma Anxiety Depression Headache (Migraine) Urinary incontinence Acne Rosacea Diverticulosis Osteopenia  Card = Kendel Bessey ENT = [facility]  Past Surgical History: Reviewed history from 12/16/2008 and no changes required. Open Heart Surgery, MV repair, 2005 ([facility]) Hysterectomy, 1980's (No CA) ovaries present Thyroidectomy, partial (No CA) Diskectomy x 2 distantly  Review of Systems       As stated in the HPI and negative for all other systems.   Vital Signs:  Patient profile:   65 year old female Height:      68 inches Weight:      159 pounds BMI:     24.26 Pulse rate:   84 / minute Resp:     16 per minute BP sitting:   112 / 62  (right arm)  Vitals Entered By: Marrion Coy, CNA (Jan 04, 2010 11:49 AM)  Physical Exam  General:  Well developed, well nourished, in no acute distress. Head:  normocephalic and atraumatic Eyes:  PERRLA/EOM intact; conjunctiva and lids normal. Mouth:  Teeth, gums and palate normal. Oral mucosa normal. Neck:  Neck supple, no JVD. No masses, thyromegaly or abnormal cervical nodes. Chest Wall:  well-healed surgical scar Lungs:  Clear bilaterally to auscultation and percussion. Abdomen:  Bowel sounds positive;  abdomen soft and non-tender without masses, organomegaly, or hernias noted. No hepatosplenomegaly. Msk:  Back normal, normal gait. Muscle strength and tone normal. Extremities:  No clubbing or cyanosis. Neurologic:  Alert and oriented x 3. Skin:  Intact without lesions or rashes. Cervical Nodes:  no significant adenopathy Inguinal Nodes:  no significant adenopathy Psych:  Normal affect.   Detailed Cardiovascular Exam  Neck     Carotids: Carotids full and equal bilaterally without bruits.      Neck Veins: Normal, no JVD.    Heart    Inspection: no deformities or lifts noted.      Palpation: normal PMI with no thrills palpable.      Auscultation: regular rate and rhythm, S1, S2 without murmurs, rubs, gallops, or clicks.    Vascular    Abdominal Aorta: no palpable masses, pulsations, or audible bruits.      Femoral Pulses: normal femoral pulses bilaterally.      Pedal Pulses: normal pedal pulses bilaterally.      Radial Pulses: normal radial pulses bilaterally.      Peripheral Circulation: no clubbing, cyanosis, or edema noted with normal capillary refill.     EKG  Procedure date:  01/04/2010  Findings:      sinus rhythm, rate 85, axis within normal limits, intervals within normal limits, nonspecific inferolateral T wave changes. No change from previous  Impression & Recommendations:  Problem # 1:  MITRAL REGURGITATION (ICD-396.3) She is at his post repair which was last evaluated with an echo in 09. By physical and would not suggest that there was any change and no further evaluation is warranted. She understands and occurred as prophylaxis.  Problem # 2:  ATRIAL FIBRILLATION (ICD-427.31) She may well be having short paroxysms of this but this is well tolerated. No change in therapy is indicated.  Problem # 3:  DEPRESSION (ICD-311) I encouraged her that things will get better but that she needs to continue to pursue medical management with her primary physician.   Patient Instructions: 1)  Your physician recommends that you schedule a follow-up appointment in: follow in 1 year 2)  Your physician recommends that you continue on your current medications as directed. Please refer to the Current Medication list given to you today.

## 2010-09-28 NOTE — Progress Notes (Signed)
Summary: refill    Phone Note Refill Request   Refills Requested: Medication #1:  TOPROL XL 25 MG XR24H-TAB take one by mouth daily   Supply Requested: 3 months [facility]   Method Requested: Fax to Local Pharmacy Initial call taken by: Migdalia Dk,  November 09, 2009 10:38 AM  Follow-up for Phone Call        Rx sent to pharmacy. Pt notified. Marrion Coy, CNA  November 09, 2009 11:17 AM  Follow-up by: Marrion Coy, CNA,  November 09, 2009 11:17 AM    Prescriptions: TOPROL XL 25 MG XR24H-TAB (METOPROLOL SUCCINATE) take one by mouth daily  #90 x 2   Entered by:   Marrion Coy, CNA   Authorized by:   Rollene Rotunda, MD, [facility]   Signed by:   Marrion Coy, CNA on 11/09/2009   Method used:   Electronically to        [facility]* (retail)       [address] [place], [place]  [address]       Ph: [phone]       Fax: [phone]   RxID:   [id number]

## 2010-09-28 NOTE — Progress Notes (Signed)
Summary: refill request for clonazepam  Phone Note Refill Request Message from:  Fax from Pharmacy  Refills Requested: Medication #1:  CLONAZEPAM 1 MG TABS Up to 1 by mouth two times a day as needed anxiety   Last Refilled: 01/06/2010 Faxed request from [facility], phone [phone].  Initial call taken by: Lowella Petties CMA,  Jan 14, 2010 12:56 PM  Follow-up for Phone Call        Rx called to pharmacy Follow-up by: Benny Lennert CMA [name]),  Jan 14, 2010 2:17 PM    Prescriptions: CLONAZEPAM 1 MG TABS (CLONAZEPAM) Up to 1 by mouth two times a day as needed anxiety  #40 x 1   Entered and Authorized by:   [name] MD   Signed by:   [name] MD on 01/14/2010   Method used:   Telephoned to ...       [facility]* (retail)       [address] [place], [place]  [address]       Ph: [phone]       Fax: [phone]   RxID:   [id number]

## 2010-09-28 NOTE — Progress Notes (Signed)
Summary: Rx Warfarin  Phone Note Call from Patient Call back at Home Phone [phone]   Caller: Patient Call For: [name] MD Summary of Call: Patient is running out of her Warfarin 1mg  because of the way that the prescription is written and her insurance will not pay for it again until October 3rd. Patient states that she is taking 8mg  now and therefore has to take 3 of the 1 mg along with one of the 5 mg. Patient request that a new rx be sent to pharmacy and  let her know when this has been done. Pharmacy-[facility] Initial call taken by: Sydell Axon LPN,  May 20, 2010 3:07 PM  Follow-up for Phone Call        call in Coumadin 3 mg, #30, 11 refills Follow-up by: [name] MD,  May 20, 2010 3:42 PM    New/Updated Medications: COUMADIN 3 MG TABS (WARFARIN SODIUM) use as directed Prescriptions: COUMADIN 3 MG TABS (WARFARIN SODIUM) use as directed  #30 x 11   Entered by:   Benny Lennert CMA (AAMA)   Authorized by:   [name] MD   Signed by:   Benny Lennert CMA (AAMA) on 05/20/2010   Method used:   Electronically to        [facility]* (retail)       [address] [place], [place]  [address]       Ph: [phone]       Fax: [phone]   RxID:   [id number]

## 2010-09-28 NOTE — Progress Notes (Signed)
Summary: needs order for mammogram  Phone Note Call from Patient   Caller: Patient Call For: [name] MD Summary of Call: Pt needs order for mammogram, she goes to [facility]. Initial call taken by: Lowella Petties CMA,  March 24, 2010 10:38 AM

## 2010-09-28 NOTE — Progress Notes (Signed)
Summary: refill request for ambien, promethazine  Phone Note Refill Request Message from:  Fax from Pharmacy  Refills Requested: Medication #1:  AMBIEN 10 MG TABS take one by mouth at bedtime   Last Refilled: 02/04/2010  Medication #2:  PROMETHAZINE HCL 25 MG TABS 1 tab by mouth q6 hours as needed nausea.   Last Refilled: 03/02/2010 Faxed requests from [facility], [phone].  Initial call taken by: Lowella Petties CMA,  March 30, 2010 10:24 AM  Follow-up for Phone Call        Rx called to pharmacy Follow-up by: Benny Lennert CMA [name]),  March 30, 2010 10:44 AM    Prescriptions: PROMETHAZINE HCL 25 MG TABS (PROMETHAZINE HCL) 1 tab by mouth q6 hours as needed nausea  #10 x 0   Entered and Authorized by:   [name] MD   Signed by:   [name] MD on 03/30/2010   Method used:   Telephoned to ...       [facility]* (retail)       [address] [place], [place]  [address]       Ph: [phone]       Fax: [phone]   RxID:   [id number] AMBIEN 10 MG TABS (ZOLPIDEM TARTRATE) take one by mouth at bedtime  #30 x 0   Entered and Authorized by:   [name] MD   Signed by:   [name] MD on 03/30/2010   Method used:   Telephoned to ...       [facility]* (retail)       [address] [place], [place]  [address]       Ph: [phone]       Fax: [phone]   RxID:   [id number]

## 2010-09-28 NOTE — Progress Notes (Signed)
Summary: refill request for utrona  Phone Note Refill Request Message from:  Patient  Refills Requested: Medication #1:  UTRONA-C 81.6 MG TABS as needed for urinary tract infections. Please send to [facility].  Initial call taken by: Lowella Petties CMA,  June 18, 2010 11:29 AM  Follow-up for Phone Call        Can wait until Monday.. [name]'s return.  Follow-up by: [name] MD,  June 18, 2010 1:18 PM    Prescriptions: UTRONA-C 81.6 MG TABS (METH-HYO-M BL-NA PHOS-PH SAL) as needed for urinary tract infections.  #30 x 5   Entered and Authorized by:   [name] MD   Signed by:   [name] MD on 06/20/2010   Method used:   Electronically to        [facility]* (retail)       [address] [place], [place]  [address]       Ph: [phone]       Fax: [phone]   RxID:   [id number]

## 2010-09-28 NOTE — Assessment & Plan Note (Signed)
Summary: PROBLEMS WITH DEPRESSION AND MEDICATIONS   Vital Signs:  Patient profile:   65 year old female Height:      68 inches Weight:      155.8 pounds BMI:     23.77 Temp:     98.0 degrees F oral Pulse rate:   80 / minute Pulse rhythm:   regular BP sitting:   106 / 80  (left arm) Cuff size:   regular  Vitals Entered By: Benny Lennert CMA [name]) (Jan 14, 2010 10:27 AM)  History of Present Illness:  doing really well the last time that i saw her  about three weeks later. the bottom fell out.  Now is crying in the office. Here with her husband. Has been going downhill.   Now has not picked up a paint bursh  not cooking  not cleaning  not working in garden wakes up sick on her stomach and is sick  taking some klonopin in the morning, then having to take another pill  Micah Flesher and visited family. 5-8 hrs no si, no hi  Patient has been on the following psych meds in the past: Celexa Paxil Ativan Remeron - worked well Klonopin 1 mg by mouth two times a day Phenergan Seroquel Ambien Xanax  Allergies (verified): No Known Drug Allergies   Impression & Recommendations:  Problem # 1:  DEPRESSION (ICD-311) Assessment Deteriorated >25 minutes spent in face to face time with patient, >50% spent in counselling or coordination of care: this patient is doing fairly poorly. The last time I saw her she was doing quite well. She had recovered, and was being much more interactive. Now she is here with her husband who is highly concerned. She is crying all the time. She cannot get out of bed. She is emotionally labile in the examination room. She denies any suicidality or homicidality. She has seen psychiatry in the past, had been on many medications.  For some time, we had discussed  potential psychiatric care, and she has refused. At this point I think that she is decompensating, and  beyond the scope of my care. I am going to consult psychiatry for their  help. Her husband is with  her and is very supportive.  Her updated medication list for this problem includes:    Clonazepam 1 Mg Tabs (Clonazepam) ..... Up to 1 by mouth two times a day as needed anxiety    Mirtazapine 45 Mg Tabs (Mirtazapine) .Marland Kitchen... 1 by mouth at bedtime    Bupropion Hcl 150 Mg Xr24h-tab (Bupropion hcl) .Marland Kitchen... 1 by mouth daily  Orders: Psychiatric Referral (Psych)  Problem # 2:  ANXIETY (ICD-300.00) Assessment: Deteriorated  Her updated medication list for this problem includes:    Clonazepam 1 Mg Tabs (Clonazepam) ..... Up to 1 by mouth two times a day as needed anxiety    Mirtazapine 45 Mg Tabs (Mirtazapine) .Marland Kitchen... 1 by mouth at bedtime    Bupropion Hcl 150 Mg Xr24h-tab (Bupropion hcl) .Marland Kitchen... 1 by mouth daily  Complete Medication List: 1)  Clonazepam 1 Mg Tabs (Clonazepam) .... Up to 1 by mouth two times a day as needed anxiety 2)  Ambien 10 Mg Tabs (Zolpidem tartrate) .... Take one by mouth at bedtime 3)  Toprol Xl 25 Mg Xr24h-tab (Metoprolol succinate) .... Take one by mouth daily 4)  Coumadin 10 Mg Tabs (Warfarin sodium) .... Take one by mouth daily as directed 5)  Synthroid 50 Mcg Tabs (Levothyroxine sodium) .... Take one by mouth daily 6)  Mirtazapine 45 Mg Tabs (Mirtazapine) .Marland Kitchen.. 1 by mouth at bedtime 7)  Warfarin Sodium 1 Mg Tabs (Warfarin sodium) .... Use as directed. 8)  Utrona-c 81.6 Mg Tabs (Meth-hyo-m bl-na phos-ph sal) .... As needed for urinary tract infections. 9)  Fioricet 50-325-40 Mg Tabs (Butalbital-apap-caffeine) .... As needed migraines 10)  Alavert 10 Mg Tabs (Loratadine) .... As needed 11)  Finacea 15 % Gel (Azelaic acid) .... Two times daily 12)  Coumadin 5 Mg Tabs (Warfarin sodium) .... One tab by mouth as directed 13)  Coumadin 1 Mg Tabs (Warfarin sodium) .... Take by mouth  as directed 14)  Prevacid 15 Mg Cpdr (Lansoprazole) .Marland Kitchen.. 1 by mouth daily 15)  Bupropion Hcl 150 Mg Xr24h-tab (Bupropion hcl) .Marland Kitchen.. 1 by mouth daily  Patient Instructions: 1)  Referral  Appointment Information 2)  Day/Date: 3)  Time: 4)  Place/MD: 5)  Address: 6)  Phone/Fax: 7)  Patient given appointment information. Information/Orders faxed/mailed.  Prescriptions: BUPROPION HCL 150 MG XR24H-TAB (BUPROPION HCL) 1 by mouth daily  #30 x 1   Entered and Authorized by:   [name] MD   Signed by:   [name] MD on 01/14/2010   Method used:   Print then Give to Patient   RxID:   [id number]   Current Allergies (reviewed today): No known allergies

## 2010-09-28 NOTE — Progress Notes (Signed)
Summary: clonazepam  Phone Note Refill Request Message from:  Fax from Pharmacy on April 30, 2010 11:08 AM  Refills Requested: Medication #1:  CLONAZEPAM 1 MG TABS Up to 1 by mouth two times a day as needed anxiety   Last Refilled: 04/15/2010 Refill request from [facility]. [phone]  Initial call taken by: Melody Comas,  April 30, 2010 11:10 AM  Follow-up for Phone Call        Await [place] return on Monday Follow-up by: [name] MD,  April 30, 2010 1:55 PM  Additional Follow-up for Phone Call Additional follow up Details #1::        as it was last written, should have another refill. I filled at the end of july with #40 and 1 refill.   check and see how much she is having to take now? Additional Follow-up by: [name] MD,  April 30, 2010 3:59 PM    Additional Follow-up for Phone Call Additional follow up Details #2::    Patient is taken 2 of these pills daily Follow-up by: Benny Lennert CMA [name]),  April 30, 2010 4:08 PM  Additional Follow-up for Phone Call Additional follow up Details #3:: Details for Additional Follow-up Action Taken: For her, I think this is ok. Everything else has failed that has been tried by me, multiple other MD's, psychiatry, and patient has improved function.   please call in for her. [name] MD  April 30, 2010 4:13 PM   rx called in for patient and patient advised.Consuello Masse CMA   Additional Follow-up by: Benny Lennert CMA [name]),  April 30, 2010 4:22 PM  Prescriptions: CLONAZEPAM 1 MG TABS (CLONAZEPAM) Up to 1 by mouth two times a day as needed anxiety  #60 x 1   Entered and Authorized by:   [name] MD   Signed by:   Lowella Petties CMA on 04/30/2010   Method used:   Telephoned to ...       [facility]* (retail)       [address] [place], [place]  [address]       Ph: [phone]       Fax: [phone]   RxID:   [id number]

## 2010-09-28 NOTE — Medication Information (Signed)
Summary: [facility] Prior Auth Approval for Bupropion HCL XL 15  [facility] Prior Auth Approval for Bupropion HCL XL 150 mg tab from 01/19/10-01/19/13   Imported By: Beau Fanny 01/21/2010 08:58:27  _____________________________________________________________________  External Attachment:    Type:   Image     Comment:   External Document

## 2010-09-28 NOTE — Progress Notes (Signed)
Summary: refill request for clonazepam  Phone Note Refill Request Message from:  Fax from Pharmacy  Refills Requested: Medication #1:  CLONAZEPAM 1 MG TABS Up to 1 by mouth two times a day as needed anxiety   Last Refilled: 09/21/2009 Faxed request from [facility], [phone].  Initial call taken by: Lowella Petties CMA,  December 10, 2009 9:46 AM  Follow-up for Phone Call        Rx called to pharmacy Follow-up by: Benny Lennert CMA [name]),  December 10, 2009 10:08 AM    Prescriptions: CLONAZEPAM 1 MG TABS (CLONAZEPAM) Up to 1 by mouth two times a day as needed anxiety  #40 x 1   Entered and Authorized by:   [name] MD   Signed by:   [name] MD on 12/10/2009   Method used:   Telephoned to ...       [facility]* (retail)       [address] [place], [place]  [address]       Ph: [phone]       Fax: [phone]   RxID:   [id number]

## 2010-09-28 NOTE — Progress Notes (Signed)
Summary: Remus Loffler   Phone Note Refill Request Message from:  Fax from Pharmacy on May 24, 2010 4:56 PM  Refills Requested: Medication #1:  AMBIEN 10 MG TABS take one by mouth at bedtime   Last Refilled: 03/30/2010 Refill request from [facility]. [phone].   Initial call taken by: Melody Comas,  May 24, 2010 4:57 PM  Follow-up for Phone Call        Rx called to pharmacy Follow-up by: Benny Lennert CMA [name]),  May 25, 2010 12:03 PM    Prescriptions: AMBIEN 10 MG TABS (ZOLPIDEM TARTRATE) take one by mouth at bedtime  #30 x 1   Entered and Authorized by:   [name] MD   Signed by:   [name] MD on 05/24/2010   Method used:   Telephoned to ...       [facility]* (retail)       [address] [place], [place]  [address]       Ph: [phone]       Fax: [phone]   RxID:   [id number]

## 2010-09-28 NOTE — Assessment & Plan Note (Signed)
Summary: GENERAL HEALTH ISSUES/DLO   Vital Signs:  Patient profile:   65 year old female Height:      68 inches Weight:      168.0 pounds BMI:     25.64 Temp:     98.5 degrees F oral Pulse rate:   80 / minute Pulse rhythm:   regular BP sitting:   120 / 90  (left arm) Cuff size:   regular  Vitals Entered By: Benny Lennert CMA [name]) (September 17, 2009 10:13 AM)  History of Present Illness: Chief complaint patient getting depressed  Depression:  Trying to get her mother into a facility right now, and has been having a lot of crying spells. Mother had been hoarding some medications.  Having a really hard time taking care of her mother at home. Her mom's doctor and her doctor along with miss Lawhead, decided that her mother needs to go to snf. Having some difficulty difficulty dealing with all this.   Feeling overwhelmed some. Feeling like she cannot handle. Repeating herself some.        Allergies (verified): No Known Drug Allergies   Impression & Recommendations:  Problem # 1:  DEPRESSION (ICD-311) Assessment Deteriorated >25 minutes spent in total face to face time with the patient with >50% of time spent in counselling and coordination of care: the entirety of the visit is spent in counseling. The patient is here with  her daughter in the office today, with some concerns.  Overall, compared to when I initially evaluated her when she was in a decompensated state, the patient is doing better overall. However over the last several weeks, the patient has done very poorly,  given that her mother is deteriorating, and they're placing her in a skilled nursing facility.  She is having a great deal of difficulty dealing with this. She called our office the other day, and had her increase her Klonopin dosing, and that stabilized her acutely.  She is highly reluctant and highly anxious  about  restarting any additional  SSRIs or other antidepressants. The patient has been on multiple  medications in the past including SSRIs, other antidepressants, and antipsychotic medications. She did very poorly on these, and is highly reluctant  to resume them.  She's also been in counseling before, and felt that she received no benefit. We had a very long discussion about all this, and my best recommendation given her reluctance was for her to seek out a counselor that she could develop a good rapport with.  For now, I think that increasing her Klonopin is reasonable, and  will follow up with her in 3 or 4 weeks.  Her updated medication list for this problem includes:    Clonazepam 1 Mg Tabs (Clonazepam) .Marland Kitchen... Take 1/4 tab by mouth daily    Mirtazapine 45 Mg Tabs (Mirtazapine) .Marland Kitchen... 1 by mouth at bedtime  Orders: Psychology Referral (Psychology)  Problem # 2:  HYPOTHYROIDISM (ICD-244.9) Assessment: Unchanged  Her updated medication list for this problem includes:    Synthroid 50 Mcg Tabs (Levothyroxine sodium) .Marland Kitchen... Take one by mouth daily  Orders: Venipuncture ([address]) TLB-TSH (Thyroid Stimulating Hormone) (84443-TSH) TLB-T4 (Thyrox), Free [phone]) TLB-T4 (Thyrox), Total [phone])  Complete Medication List: 1)  Clonazepam 1 Mg Tabs (Clonazepam) .... Take 1/4 tab by mouth daily 2)  Ambien 10 Mg Tabs (Zolpidem tartrate) .... Take one by mouth at bedtime 3)  Toprol Xl 25 Mg Xr24h-tab (Metoprolol succinate) .... Take one by mouth daily 4)  Coumadin 10 Mg  Tabs (Warfarin sodium) .... Take one by mouth daily as directed 5)  Synthroid 50 Mcg Tabs (Levothyroxine sodium) .... Take one by mouth daily 6)  Mirtazapine 45 Mg Tabs (Mirtazapine) .Marland Kitchen.. 1 by mouth at bedtime 7)  Warfarin Sodium 1 Mg Tabs (Warfarin sodium) .... Use as directed. 8)  Utrona-c 81.6 Mg Tabs (Meth-hyo-m bl-na phos-ph sal) .... As needed for urinary tract infections. 9)  Doxycycline Monohydrate 50 Mg Caps (Doxycycline monohydrate) .... Take 1 capsule by mouth once a day for rosacea 10)  Fioricet 50-325-40 Mg Tabs  (Butalbital-apap-caffeine) .... As needed migraines 11)  Alavert 10 Mg Tabs (Loratadine) .... As needed  Patient Instructions: 1)  f/u 1 month 2)  Referral Appointment Information 3)  Day/Date: 4)  Time: 5)  Place/MD: 6)  Address: 7)  Phone/Fax: 8)  Patient given appointment information. Information/Orders faxed/mailed.   Current Allergies (reviewed today): No known allergies

## 2010-09-28 NOTE — Progress Notes (Signed)
Summary: feeling dizzy since increase in med  Phone Note Call from Patient Call back at Home Phone [phone]   Caller: Patient Call For: [name] MD Summary of Call: Patient was seen on 01-14-10 and on that day she increased her clonazepam to 1 tab 3 times a day. Since then she has been taking 1 1/2 two times a day. She says that since the increas she has been feeling very dizzy, can't walk without holding on to something. She feels very nauseated with throwing up at least once  every day and can't keep food down. She wants to know if it is from the increase in the clonazepam or if there is possibly something else going on. She also wants to know if she can have something for the nausea called in to [facility] because she feels it may be her nerves. Please advise.  Initial call taken by: Melody Comas,  Jan 18, 2010 9:23 AM  Follow-up for Phone Call        call this patient.  decrease the clonazepam dosing. Taking too much  nausea could occur - i suggest decreasing medicine dosing  Absolutely critical that she follow- Follow-up by: [name] MD,  Jan 18, 2010 9:54 AM  Additional Follow-up for Phone Call Additional follow up Details #1::        Patient wants something called in for the nausea she is having because she cant eat anything patient uses [facility].  Spoke with patient about clonazepam doses and she will decrease to 2 times a day and if that doesnt help she will go back to 1 1/2 daily. Patient also keeping follow up with psy Additional Follow-up by: Benny Lennert CMA [name]),  Jan 18, 2010 10:01 AM    Additional Follow-up for Phone Call Additional follow up Details #2::    meant to send to [facility] - can you call [facility] and fix for me Follow-up by: [name] MD,  Jan 18, 2010 10:14 AM  Additional Follow-up for Phone Call Additional follow up Details #3:: Details for Additional Follow-up Action Taken: rx canceled at [facility]  and sent to [facility] pharmacy and  patient notified Additional Follow-up by: Benny Lennert CMA [name]),  Jan 18, 2010 10:19 AM  New/Updated Medications: ONDANSETRON 4 MG TBDP (ONDANSETRON) 1 by mouth under tongue as needed nausea q 6 hours as needed Prescriptions: ONDANSETRON 4 MG TBDP (ONDANSETRON) 1 by mouth under tongue as needed nausea q 6 hours as needed  #30 x 0   Entered by:   Benny Lennert CMA (AAMA)   Authorized by:   [name] MD   Signed by:   Benny Lennert CMA (AAMA) on 01/18/2010   Method used:   Electronically to        [facility]* (retail)       [address] [place], [place]  [address]       Ph: [phone]       Fax: [phone]   RxID:   [id number] ONDANSETRON 4 MG TBDP (ONDANSETRON) 1 by mouth under tongue as needed nausea q 6 hours as needed  #30 x 0   Entered and Authorized by:   [name] MD   Signed by:   [name] MD on 01/18/2010   Method used:   Electronically to        [facility]* (retail)  [address] [place], [place]  [address]       Ph: [phone]       Fax: [phone]   RxID:   [id number]

## 2010-09-28 NOTE — Progress Notes (Signed)
Summary: refill request for clonazepam  Phone Note Refill Request Message from:  Fax from Pharmacy  Refills Requested: Medication #1:  CLONAZEPAM 1 MG TABS Up to 1 by mouth two times a day as needed anxiety   Last Refilled: 03/02/2010 Faxed request from [facility], [phone].  Initial call taken by: Lowella Petties CMA,  March 25, 2010 9:33 AM  Follow-up for Phone Call        Rx called to pharmacy Follow-up by: Benny Lennert CMA [name]),  March 25, 2010 10:11 AM    Prescriptions: CLONAZEPAM 1 MG TABS (CLONAZEPAM) Up to 1 by mouth two times a day as needed anxiety  #40 x 1   Entered and Authorized by:   [name] MD   Signed by:   [name] MD on 03/25/2010   Method used:   Telephoned to ...       [facility]* (retail)       [address] [place], [place]  [address]       Ph: [phone]       Fax: [phone]   RxID:   [id number]

## 2010-09-28 NOTE — Progress Notes (Signed)
Summary: SYNTHROID 50 MCG TABS  Phone Note Refill Request Call back at Home Phone [phone] Message from:  Patient on October 19, 2009 9:30 AM  Refills Requested: Medication #1:  SYNTHROID 50 MCG TABS take one by mouth daily Needs it sent to [facility].   Initial call taken by: Melody Comas,  October 19, 2009 9:30 AM    Prescriptions: SYNTHROID 50 MCG TABS (LEVOTHYROXINE SODIUM) take one by mouth daily  #30 x 11   Entered by:   Benny Lennert CMA (AAMA)   Authorized by:   [name] MD   Signed by:   Benny Lennert CMA (AAMA) on 10/19/2009   Method used:   Electronically to        [facility]* (retail)       [address] [place], [place]  [address]       Ph: [phone]       Fax: [phone]   RxID:   [id number]

## 2010-09-28 NOTE — Progress Notes (Signed)
Summary: ? Change medication  Phone Note From Pharmacy Call back at [phone]   Caller: [facility] Call For: Dr. Patsy Lager  Summary of Call: They received a rx for Ondanstion 4mg  and this is not covered by her insurance. Can this be switched to something else? Please advise.  Initial call taken by: Sydell Axon LPN,  March 02, 7424 11:47 AM  Follow-up for Phone Call        Call pt ..has she tolerated phenergan in past? Follow-up by: [name] MD,  March 02, 2010 1:19 PM  Additional Follow-up for Phone Call Additional follow up Details #1::        PATIENT SAYS THAT SHE HAS NOT TRIED THIS MEDICATION.Consuello Masse CMA   Additional Follow-up by: Benny Lennert CMA [name]),  March 02, 2010 2:07 PM    Additional Follow-up for Phone Call Additional follow up Details #2::    On multiple sedating medications...recommend limited use of phenergan.  If nausea not improving call to discuss with primary MD.  Follow-up by: [name] MD,  March 02, 2010 2:37 PM  Additional Follow-up for Phone Call Additional follow up Details #3:: Details for Additional Follow-up Action Taken: i think that is reasonable [name] MD  March 02, 2010 2:43 PM   New/Updated Medications: PROMETHAZINE HCL 25 MG TABS (PROMETHAZINE HCL) 1 tab by mouth q6 hours as needed nausea Prescriptions: PROMETHAZINE HCL 25 MG TABS (PROMETHAZINE HCL) 1 tab by mouth q6 hours as needed nausea  #10 x 0   Entered and Authorized by:   [name] MD   Signed by:   [name] MD on 03/02/2010   Method used:   Electronically to        [facility]* (retail)       [address] [place], [place]  [address]       Ph: [phone]       Fax: [phone]   RxID:   [id number]

## 2010-09-28 NOTE — Progress Notes (Signed)
Summary: Rx Ambien  Phone Note Refill Request Call back at [phone] Message from:  [facility] on September 18, 2009 10:00 AM  Refills Requested: Medication #1:  AMBIEN 10 MG TABS take one by mouth at bedtime   Last Refilled: 08/12/2009  Medication #2:  CLONAZEPAM 1 MG TABS take 1/4 tab by mouth daily Received faxed refill request, please advise   Method Requested: Telephone to Pharmacy Initial call taken by: Linde Gillis CMA [name]),  September 18, 2009 10:00 AM  Follow-up for Phone Call        [place] Dr. Cyndie Chime recs...he saw her 1/20, ? dose increased. Follow-up by: [name] MD,  September 18, 2009 5:28 PM  Additional Follow-up for Phone Call Additional follow up Details #1::        rxc alled in to pharmacy Additional Follow-up by: Benny Lennert CMA [name]),  September 21, 2009 9:56 AM    New/Updated Medications: CLONAZEPAM 1 MG TABS (CLONAZEPAM) Up to 1 by mouth two times a day as needed anxiety   Prescriptions: AMBIEN 10 MG TABS (ZOLPIDEM TARTRATE) take one by mouth at bedtime  #30 x 5   Entered and Authorized by:   [name] MD   Signed by:   [name] MD on 09/20/2009   Method used:   Telephoned to ...       [facility]* (retail)       [address] [place], [place]  [address]       Ph: [phone]       Fax: [phone]   RxID:   [id number] CLONAZEPAM 1 MG TABS (CLONAZEPAM) Up to 1 by mouth two times a day as needed anxiety  #40 x 0   Entered and Authorized by:   [name] MD   Signed by:   [name] MD on 09/20/2009   Method used:   Telephoned to ...       [facility]* (retail)       [address] [place], [place]  [address]       Ph: [phone]       Fax: [phone]   RxID:   [id number]

## 2010-09-28 NOTE — Miscellaneous (Signed)
Summary: Controlled Substances Contract  Controlled Substances Contract   Imported By: Maryln Gottron 04/09/2010 11:04:55  _____________________________________________________________________  External Attachment:    Type:   Image     Comment:   External Document

## 2010-09-28 NOTE — Progress Notes (Signed)
Summary: prior auth needed for bupropion  Phone Note From Pharmacy   [facility] Summary of Call: Prior Berkley Harvey is needed for bupropion, form is on your desk. Initial call taken by: Lowella Petties CMA,  Jan 15, 2010 4:52 PM  Follow-up for Phone Call        will complete Follow-up by: [name] MD,  Jan 15, 2010 5:46 PM

## 2010-09-28 NOTE — Progress Notes (Signed)
Summary: promethazine   Phone Note Refill Request Message from:  Fax from Pharmacy on June 03, 2010 1:10 PM  Refills Requested: Medication #1:  PROMETHAZINE HCL 25 MG TABS 1 tab by mouth q6 hours as needed nausea   Last Refilled: 04/13/2010 Refill request from [facility]. [phone].   Initial call taken by: Melody Comas,  June 03, 2010 1:21 PM    Prescriptions: PROMETHAZINE HCL 25 MG TABS (PROMETHAZINE HCL) 1 tab by mouth q6 hours as needed nausea  #30 x 0   Entered and Authorized by:   [name] MD   Signed by:   [name] MD on 06/03/2010   Method used:   Electronically to        [facility]* (retail)       [address] [place], [place]  [address]       Ph: [phone]       Fax: [phone]   RxID:   [id number]
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
## 2010-09-28 NOTE — Progress Notes (Signed)
Summary: Request urine test  Phone Note Call from Patient Call back at Home Phone [phone]   Caller: Patient Call For: [name] MD Summary of Call: Patient is scheduled to come in Wednesday for a PT and flu shot. Patient states that she is continuing to have urinary problems and has been using Uribel Caps that were given by Dr. Patsy Lager, but every time that she misses a pill the problem starts back. Patient states that the pills make her feel bad. Patient wants to know if you will order a urine test for her when she comes in for her lab appt tomorrow? Patient states that she is having burning with urination, no fever or back pain.  Patient states that Dr. Patsy Lager is aware of the on going problem that she has with this. Pharmacy-[facility]. Initial call taken by: Sydell Axon LPN,  June 29, 2010 12:02 PM  Follow-up for Phone Call        [name] will be back tommorow..please have him address..I usually don't order Urine with out appt.  Follow-up by: [name] MD,  June 29, 2010 1:51 PM  Additional Follow-up for Phone Call Additional follow up Details #1::        Patient advised.Consuello Masse CMA   Additional Follow-up by: Benny Lennert CMA [name]),  June 29, 2010 2:20 PM    Additional Follow-up for Phone Call Additional follow up Details #2::    schedule OV tomorrow. need to examine - not a simple history, on coumadin, needs face to face eval. [name] MD  June 29, 2010 2:24 PM   Additional Follow-up for Phone Call Additional follow up Details #3:: Details for Additional Follow-up Action Taken: Patient has appt at 2 tomorrow.Consuello Masse CMA   Additional Follow-up by: Benny Lennert CMA [name]),  June 29, 2010 3:13 PM

## 2010-09-28 NOTE — Progress Notes (Signed)
Summary: wants something for reflux  Phone Note Call from Patient Call back at Home Phone [phone]   Caller: Patient Call For: [name] MD Summary of Call: Pt states she is having problems with reflux.  She feels nauseated in the mornings, food seems to sit in her chest,  has foul burping.  She is requesting that something be called in to [facility].  She has not tried otc reflux meds. Initial call taken by: Lowella Petties CMA,  December 16, 2009 10:30 AM  Follow-up for Phone Call        Initially would try Pepcid [facility] over the counter - no interactions with coumadin and can be used intermittently Follow-up by: [name] MD,  December 16, 2009 10:38 AM  Additional Follow-up for Phone Call Additional follow up Details #1::        Patient advise.Consuello Masse CMA  Additional Follow-up by: Benny Lennert CMA [name]),  December 17, 2009 7:40 AM

## 2010-09-28 NOTE — Progress Notes (Signed)
Summary: refill request for phenergan  Phone Note Refill Request Message from:  Fax from Pharmacy  Refills Requested: Medication #1:  PROMETHAZINE HCL 25 MG TABS 1 tab by mouth q6 hours as needed nausea.   Last Refilled: 03/30/2010 Faxed request from [facility], [phone].  Initial call taken by: Lowella Petties CMA,  April 13, 2010 11:34 AM    Prescriptions: PROMETHAZINE HCL 25 MG TABS (PROMETHAZINE HCL) 1 tab by mouth q6 hours as needed nausea  #30 x 0   Entered and Authorized by:   [name] MD   Signed by:   [name] MD on 04/13/2010   Method used:   Electronically to        [facility]* (retail)       [address] [place], [place]  [address]       Ph: [phone]       Fax: [phone]   RxID:   [id number]

## 2010-09-28 NOTE — Progress Notes (Signed)
Summary: wants something for stress   Phone Note Call from Patient Call back at Home Phone [phone]   Caller: Patient Call For: [name] MD Summary of Call: Patient states that she just got word that her mother has breast cancer. She is going to be involve alot with this and feeling very nervous and upset about it all. She says that her mother will be having surgery and she needs something to help her stay calm through all of this stress. Uses [employer].  Initial call taken by: Melody Comas,  August 05, 2010 1:23 PM  Follow-up for Phone Call        she is on several medications for stress right now. I would prefer if she can call her psychatrist -- this is not a simple case.  i am sorry about her family. [name] MD  August 05, 2010 1:55 PM   Additional Follow-up for Phone Call Additional follow up Details #1::        Patient says that she will call her psychatrist and see if they will call her something in.Consuello Masse CMA   Additional Follow-up by: Benny Lennert CMA [name]),  August 05, 2010 2:20 PM
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
## 2010-09-29 ENCOUNTER — Ambulatory Visit: Payer: Self-pay

## 2010-09-29 ENCOUNTER — Ambulatory Visit: Admit: 2010-09-29 | Payer: Self-pay | Admitting: Family Medicine

## 2010-09-30 ENCOUNTER — Encounter: Payer: Self-pay | Admitting: Family Medicine

## 2010-09-30 ENCOUNTER — Ambulatory Visit (INDEPENDENT_AMBULATORY_CARE_PROVIDER_SITE_OTHER): Payer: PRIVATE HEALTH INSURANCE

## 2010-09-30 DIAGNOSIS — Z5181 Encounter for therapeutic drug level monitoring: Secondary | ICD-10-CM

## 2010-09-30 DIAGNOSIS — Z7901 Long term (current) use of anticoagulants: Secondary | ICD-10-CM

## 2010-09-30 DIAGNOSIS — I4891 Unspecified atrial fibrillation: Secondary | ICD-10-CM

## 2010-09-30 NOTE — Progress Notes (Signed)
Summary: Request office notes from Dr. Omelia Blackwater  Phone Note Call from Patient Call back at Home Phone [phone]   Caller: Patient Call For: [name] MD Summary of Call: Patient was referred to Dr. Omelia Blackwater and  would like for you to have the office notes on from that  visit.  Patient was told that you would need to request the notes on company letterhead and that way she would not have to pay $10 for the records.  Dr. Lilia Pro telephone number is [phone], fax # [phone]. Patient is scheduled to see him again in March. Initial call taken by: Sydell Axon LPN,  August 09, 2010 1:46 PM  Follow-up for Phone Call        arrange a letter asking for notes and i will sign [name] MD  August 09, 2010 1:54 PM   Additional Follow-up for Phone Call Additional follow up Details #1::        note sign by dr copland and faxed to dr Richardson Landry office.Consuello Masse CMA   Additional Follow-up by: Benny Lennert CMA [name]),  August 09, 2010 2:30 PM

## 2010-09-30 NOTE — Progress Notes (Signed)
   Prescriptions: TOPROL XL 25 MG XR24H-TAB (METOPROLOL SUCCINATE) take one by mouth daily  #90 x 3   Entered by:   Burnett Kanaris, CNA   Authorized by:   Rollene Rotunda, MD, [facility]   Signed by:   Burnett Kanaris, CNA on 08/25/2010   Method used:   Electronically to        [facility]* (retail)       [address] [place], [place]  [address]       Ph: [phone]       Fax: [phone]   RxID:   [id number]

## 2010-09-30 NOTE — Progress Notes (Signed)
Summary: Rx Zolpidem  Phone Note Refill Request Call back at [phone] Message from:  [facility] pharmacy on August 24, 2010 11:43 AM  Refills Requested: Medication #1:  AMBIEN 10 MG TABS take one by mouth at bedtime   Last Refilled: 06/29/2010 Received faxed refill request please advise.   Method Requested: Telephone to Pharmacy Initial call taken by: Linde Gillis CMA [name]),  August 24, 2010 11:44 AM  Follow-up for Phone Call        Rx called to pharmacy Follow-up by: Benny Lennert CMA [name]),  August 25, 2010 7:50 AM    Prescriptions: AMBIEN 10 MG TABS (ZOLPIDEM TARTRATE) take one by mouth at bedtime  #30 x 3   Entered and Authorized by:   [name] MD   Signed by:   [name] MD on 08/24/2010   Method used:   Telephoned to ...       [facility]* (retail)       [address] [place], [place]  [address]       Ph: [phone]       Fax: [phone]   RxID:   [id number]

## 2010-09-30 NOTE — Progress Notes (Signed)
Summary: Rx Promethazine  Phone Note Refill Request Call back at [phone] Message from:  Elly Modena on August 24, 2010 9:35 AM  Refills Requested: Medication #1:  PROMETHAZINE HCL 25 MG TABS 1 tab by mouth q6 hours as needed nausea   Last Refilled: 07/26/2010 Received faxed refill request please advise.   Method Requested: Electronic Initial call taken by: Linde Gillis CMA [name]),  August 24, 2010 9:36 AM    Prescriptions: PROMETHAZINE HCL 25 MG TABS (PROMETHAZINE HCL) 1 tab by mouth q6 hours as needed nausea  #30 x 1   Entered and Authorized by:   [name] MD   Signed by:   [name] MD on 08/24/2010   Method used:   Electronically to        [facility]* (retail)       [address] [place], [place]  [address]       Ph: [phone]       Fax: [phone]   RxID:   [id number]

## 2010-09-30 NOTE — Letter (Signed)
Summary: Generic Letter  [place] [facility]  [address] [place], [place] [address]   Phone: [phone]  Fax: [phone]    08/09/2010     [name] [address] [place], [place]  [address]    To Whom It May Concern,  The above named patient is a mutual patient that we both see. I referred her to you and would like to have the notes from the visits when you have seen her. Please fax the notes on the above name patient to [phone]. If any question you can contact me at [phone].   Thanks, [employer]           Sincerely,   Benny Lennert CMA (AAMA)

## 2010-10-06 ENCOUNTER — Ambulatory Visit (INDEPENDENT_AMBULATORY_CARE_PROVIDER_SITE_OTHER): Payer: No Typology Code available for payment source | Admitting: Psychology

## 2010-10-06 DIAGNOSIS — F331 Major depressive disorder, recurrent, moderate: Secondary | ICD-10-CM

## 2010-10-06 NOTE — Medication Information (Signed)
Summary: protime/tw   PCP: [name] MD Indication 1: Atrial fibrillation PT 21.8 INR RANGE 2.0-3.0           Allergies: No Known Drug Allergies  Anticoagulation Management History:      Positive risk factors for bleeding include an age of 46 years or older.  The bleeding index is 'intermediate risk'.  Negative CHADS2 values include Age > 66 years old.  Her last INR was 2.7 and today's INR is 1.8.  Prothrombin time is 21.8.    Anticoagulation Management Assessment/Plan:      The patient's current anticoagulation dose is Coumadin 10 mg tabs: take one by mouth daily as directed, Warfarin sodium 1 mg tabs: Use as directed., Coumadin 5 mg tabs: one tab by mouth as directed, Coumadin 1 mg tabs: take by mouth  as directed, Coumadin 3 mg tabs: use as directed.  The next INR is due 2 weeks.         ANTICOAGULATION RECORD PREVIOUS REGIMEN & LAB RESULTS Anticoagulation Diagnosis:  Atrial fibrillation on  05/15/2009 Previous INR Goal Range:  2.0-3.0 on  05/15/2009 Previous INR:  2.7 on  09/01/2010 Previous Coumadin Dose(mg):  8mg  daily, 6 mg wed on  06/30/2010 Previous Regimen:  8mg  daily 6mg  wed on  06/30/2010 Previous Coagulation Comments:  . on  12/16/2009  NEW REGIMEN & LAB RESULTS Current INR: 1.8 Current Coumadin Dose(mg): 8mg  daily, 8mg  wed Regimen: 8mg  daily, 8mg  wed  Provider: Shelina Luo Repeat testing in: 2 weeks Dose has been reviewed with patient or caretaker during this visit. Reviewed by: Celso Sickle  Anticoagulation Visit Questionnaire Coumadin dose missed/changed:  No Abnormal Bleeding Symptoms:  No  Any diet changes including alcohol intake, vegetables or greens since the last visit:  No Any illnesses or hospitalizations since the last visit:  No Any signs of clotting since the last visit (including chest discomfort, dizziness, shortness of breath, arm tingling, slurred speech, swelling or redness in leg):  No  MEDICATIONS CLONAZEPAM 1 MG TABS  (CLONAZEPAM) Up to 1 by mouth two times a day as needed anxiety AMBIEN 10 MG TABS (ZOLPIDEM TARTRATE) take one by mouth at bedtime TOPROL XL 25 MG XR24H-TAB (METOPROLOL SUCCINATE) take one by mouth daily COUMADIN 10 MG TABS (WARFARIN SODIUM) take one by mouth daily as directed SYNTHROID 50 MCG TABS (LEVOTHYROXINE SODIUM) take one by mouth daily MIRTAZAPINE 45 MG TABS (MIRTAZAPINE) 1 by mouth at bedtime WARFARIN SODIUM 1 MG TABS (WARFARIN SODIUM) Use as directed. FIORICET 50-325-40 MG TABS (BUTALBITAL-APAP-CAFFEINE) as needed migraines ALAVERT 10 MG TABS (LORATADINE) as needed FINACEA 15 % GEL (AZELAIC ACID) two times daily COUMADIN 5 MG TABS (WARFARIN SODIUM) one tab by mouth as directed COUMADIN 1 MG TABS (WARFARIN SODIUM) take by mouth  as directed LANSOPRAZOLE 30 MG CPDR (LANSOPRAZOLE) 1 by mouth daily (failure prilosec) BUPROPION HCL 150 MG XR24H-TAB (BUPROPION HCL) 1 by mouth daily ONDANSETRON 4 MG TBDP (ONDANSETRON) 1 by mouth under tongue as needed nausea q 6 hours as needed PROMETHAZINE HCL 25 MG TABS (PROMETHAZINE HCL) 1 tab by mouth q6 hours as needed nausea COUMADIN 3 MG TABS (WARFARIN SODIUM) use as directed URIBEL 118 MG CAPS (METH-HYO-M BL-NA PHOS-PH SAL) AS NEEDED FOR UTI     Laboratory Results   Blood Tests   Date/Time Received: September 30, 2010 11:15 AM Date/Time Reported: September 30, 2010 11:15 AM  PT: 21.8 s   (Normal Range: 10.6-13.4)  INR: 1.8   (Normal Range: 0.88-1.12   Therap INR: 2.0-3.5)

## 2010-10-11 ENCOUNTER — Telehealth: Payer: Self-pay | Admitting: Family Medicine

## 2010-10-14 ENCOUNTER — Ambulatory Visit: Payer: PRIVATE HEALTH INSURANCE

## 2010-10-15 ENCOUNTER — Encounter: Payer: Self-pay | Admitting: Family Medicine

## 2010-10-15 ENCOUNTER — Ambulatory Visit (INDEPENDENT_AMBULATORY_CARE_PROVIDER_SITE_OTHER): Payer: PRIVATE HEALTH INSURANCE

## 2010-10-15 DIAGNOSIS — Z5181 Encounter for therapeutic drug level monitoring: Secondary | ICD-10-CM

## 2010-10-15 DIAGNOSIS — Z7901 Long term (current) use of anticoagulants: Secondary | ICD-10-CM

## 2010-10-15 DIAGNOSIS — I4891 Unspecified atrial fibrillation: Secondary | ICD-10-CM

## 2010-10-15 LAB — CONVERTED CEMR LAB: Prothrombin Time: 25.7 s

## 2010-10-20 NOTE — Progress Notes (Signed)
Summary: promethazine  Phone Note Refill Request Message from:  Fax from Pharmacy on October 11, 2010 10:36 AM  Refills Requested: Medication #1:  PROMETHAZINE HCL 25 MG TABS 1 tab by mouth q6 hours as needed nausea   Last Refilled: 09/20/2009 Refill request from [facility]. [phone]  Initial call taken by: Melody Comas,  October 11, 2010 10:38 AM    Prescriptions: PROMETHAZINE HCL 25 MG TABS (PROMETHAZINE HCL) 1 tab by mouth q6 hours as needed nausea  #30 x 1   Entered and Authorized by:   [name] MD   Signed by:   [name] MD on 10/11/2010   Method used:   Electronically to        [facility]* (retail)       [address] [place], [place]  [address]       Ph: [phone]       Fax: [phone]   RxID:   [id number]

## 2010-10-20 NOTE — Medication Information (Signed)
Summary: protime/tsc   PCP: [name] MD Indication 1: Atrial fibrillation PT 25.7 INR RANGE 2.0-3.0           Allergies: No Known Drug Allergies  Anticoagulation Management History:      Positive risk factors for bleeding include an age of 66 years or older.  The bleeding index is 'intermediate risk'.  Negative CHADS2 values include Age > 66 years old.  Her last INR was 1.8 and today's INR is 2.1.  Prothrombin time is 25.7.    Anticoagulation Management Assessment/Plan:      The patient's current anticoagulation dose is Coumadin 10 mg tabs: take one by mouth daily as directed, Warfarin sodium 1 mg tabs: Use as directed., Coumadin 5 mg tabs: one tab by mouth as directed, Coumadin 1 mg tabs: take by mouth  as directed, Coumadin 3 mg tabs: use as directed.  The next INR is due 4 weeks.        Laboratory Results   Blood Tests   Date/Time Recieved: October 15, 2010 10:00 AM  Date/Time Reported: October 15, 2010 10:00 AM   PT: 25.7 s   (Normal Range: 10.6-13.4)  INR: 2.1   (Normal Range: 0.88-1.12   Therap INR: 2.0-3.5)      ANTICOAGULATION RECORD PREVIOUS REGIMEN & LAB RESULTS Anticoagulation Diagnosis:  Atrial fibrillation on  05/15/2009 Previous INR Goal Range:  2.0-3.0 on  05/15/2009 Previous INR:  1.8 on  09/30/2010 Previous Coumadin Dose(mg):  8mg  daily, 8mg  wed on  09/30/2010 Previous Regimen:  8mg  daily, 8mg  wed on  09/30/2010 Previous Coagulation Comments:  . on  12/16/2009  NEW REGIMEN & LAB RESULTS Current INR: 2.1 Regimen: 8mg  daily, 8mg  wed  (no change)  Provider: [facility]      Repeat testing in: 4 weeks MEDICATIONS CLONAZEPAM 1 MG TABS (CLONAZEPAM) Up to 1 by mouth two times a day as needed anxiety AMBIEN 10 MG TABS (ZOLPIDEM TARTRATE) take one by mouth at bedtime TOPROL XL 25 MG XR24H-TAB (METOPROLOL SUCCINATE) take one by mouth daily COUMADIN 10 MG TABS (WARFARIN SODIUM) take one by mouth daily as directed SYNTHROID 50 MCG TABS  (LEVOTHYROXINE SODIUM) take one by mouth daily MIRTAZAPINE 45 MG TABS (MIRTAZAPINE) 1 by mouth at bedtime WARFARIN SODIUM 1 MG TABS (WARFARIN SODIUM) Use as directed. FIORICET 50-325-40 MG TABS (BUTALBITAL-APAP-CAFFEINE) as needed migraines ALAVERT 10 MG TABS (LORATADINE) as needed FINACEA 15 % GEL (AZELAIC ACID) two times daily COUMADIN 5 MG TABS (WARFARIN SODIUM) one tab by mouth as directed COUMADIN 1 MG TABS (WARFARIN SODIUM) take by mouth  as directed LANSOPRAZOLE 30 MG CPDR (LANSOPRAZOLE) 1 by mouth daily (failure prilosec) BUPROPION HCL 150 MG XR24H-TAB (BUPROPION HCL) 1 by mouth daily ONDANSETRON 4 MG TBDP (ONDANSETRON) 1 by mouth under tongue as needed nausea q 6 hours as needed PROMETHAZINE HCL 25 MG TABS (PROMETHAZINE HCL) 1 tab by mouth q6 hours as needed nausea COUMADIN 3 MG TABS (WARFARIN SODIUM) use as directed URIBEL 118 MG CAPS (METH-HYO-M BL-NA PHOS-PH SAL) AS NEEDED FOR UTI  Dose has been reviewed with patient or caretaker during this visit.  Reviewed by: Allison Quarry  Anticoagulation Visit Questionnaire      Coumadin dose missed/changed:  No      Abnormal Bleeding Symptoms:  No Any diet changes including alcohol intake, vegetables or greens since the last visit:  No Any illnesses or hospitalizations since the last visit:  No Any signs of clotting since the last visit (including chest discomfort, dizziness, shortness of breath, arm tingling,  slurred speech, swelling or redness in leg):  No

## 2010-11-11 ENCOUNTER — Ambulatory Visit (INDEPENDENT_AMBULATORY_CARE_PROVIDER_SITE_OTHER): Payer: Medicare Other

## 2010-11-11 ENCOUNTER — Encounter: Payer: Self-pay | Admitting: Family Medicine

## 2010-11-11 DIAGNOSIS — I4891 Unspecified atrial fibrillation: Secondary | ICD-10-CM

## 2010-11-11 DIAGNOSIS — Z5181 Encounter for therapeutic drug level monitoring: Secondary | ICD-10-CM

## 2010-11-11 DIAGNOSIS — Z7901 Long term (current) use of anticoagulants: Secondary | ICD-10-CM

## 2010-11-11 LAB — CONVERTED CEMR LAB: INR: 1.7

## 2010-11-12 ENCOUNTER — Ambulatory Visit: Payer: PRIVATE HEALTH INSURANCE

## 2010-11-16 NOTE — Medication Information (Signed)
Summary: protime/tw   PCP: Deborah Beat MD Indication 1: Atrial fibrillation PT 20.8 INR RANGE 2.0-3.0           Allergies: No Known Drug Allergies  Anticoagulation Management History:      Positive risk factors for bleeding include an age of 48 years or older.  The bleeding index is 'intermediate risk'.  Negative CHADS2 values include Age > 66 years old.  Her last INR was 2.1 and today's INR is 1.7.  Prothrombin time is 20.8.    Anticoagulation Management Assessment/Plan:      The patient's current anticoagulation dose is Coumadin 10 mg tabs: take one by mouth daily as directed, Warfarin sodium 1 mg tabs: Use as directed., Coumadin 5 mg tabs: one tab by mouth as directed, Coumadin 1 mg tabs: take by mouth  as directed, Coumadin 3 mg tabs: use as directed.  The next INR is due [name].        Laboratory Results   Blood Tests   Date/Time Recieved: November 11, 2010 10:00 AM  Date/Time Reported: November 11, 2010 10:00 AM   PT: 20.8 s   (Normal Range: 10.6-13.4)  INR: 1.7   (Normal Range: 0.88-1.12   Therap INR: 2.0-3.5)      ANTICOAGULATION RECORD PREVIOUS REGIMEN & LAB RESULTS Anticoagulation Diagnosis:  Atrial fibrillation on  05/15/2009 Previous INR Goal Range:  2.0-3.0 on  05/15/2009 Previous INR:  2.1 on  10/15/2010 Previous Coumadin Dose(mg):  8mg  daily, 8mg  wed on  09/30/2010 Previous Regimen:  8mg  daily, 8mg  wed on  09/30/2010 Previous Coagulation Comments:  . on  12/16/2009  NEW REGIMEN & LAB RESULTS Current INR: 1.7 Current Coumadin Dose(mg): 8mg  qd,6mg  wed Regimen: 8mg  qd  Provider: 2 weeks      Repeat testing in: [name] MEDICATIONS CLONAZEPAM 1 MG TABS (CLONAZEPAM) Up to 1 by mouth two times a day as needed anxiety AMBIEN 10 MG TABS (ZOLPIDEM TARTRATE) take one by mouth at bedtime TOPROL XL 25 MG XR24H-TAB (METOPROLOL SUCCINATE) take one by mouth daily COUMADIN 10 MG TABS (WARFARIN SODIUM) take one by mouth daily as directed SYNTHROID 50 MCG  TABS (LEVOTHYROXINE SODIUM) take one by mouth daily MIRTAZAPINE 45 MG TABS (MIRTAZAPINE) 1 by mouth at bedtime WARFARIN SODIUM 1 MG TABS (WARFARIN SODIUM) Use as directed. FIORICET 50-325-40 MG TABS (BUTALBITAL-APAP-CAFFEINE) as needed migraines ALAVERT 10 MG TABS (LORATADINE) as needed FINACEA 15 % GEL (AZELAIC ACID) two times daily COUMADIN 5 MG TABS (WARFARIN SODIUM) one tab by mouth as directed COUMADIN 1 MG TABS (WARFARIN SODIUM) take by mouth  as directed LANSOPRAZOLE 30 MG CPDR (LANSOPRAZOLE) 1 by mouth daily (failure prilosec) BUPROPION HCL 150 MG XR24H-TAB (BUPROPION HCL) 1 by mouth daily ONDANSETRON 4 MG TBDP (ONDANSETRON) 1 by mouth under tongue as needed nausea q 6 hours as needed PROMETHAZINE HCL 25 MG TABS (PROMETHAZINE HCL) 1 tab by mouth q6 hours as needed nausea COUMADIN 3 MG TABS (WARFARIN SODIUM) use as directed URIBEL 118 MG CAPS (METH-HYO-M BL-NA PHOS-PH SAL) AS NEEDED FOR UTI  Dose has been reviewed with patient or caretaker during this visit.  Reviewed by: [name]  Anticoagulation Visit Questionnaire      Coumadin dose missed/changed:  No      Abnormal Bleeding Symptoms:  No Any diet changes including alcohol intake, vegetables or greens since the last visit:  No Any illnesses or hospitalizations since the last visit:  No Any signs of clotting since the last visit (including chest discomfort, dizziness, shortness of breath, arm tingling,  slurred speech, swelling or redness in leg):  No

## 2010-11-23 ENCOUNTER — Encounter: Payer: Self-pay | Admitting: Family Medicine

## 2010-11-25 ENCOUNTER — Ambulatory Visit (INDEPENDENT_AMBULATORY_CARE_PROVIDER_SITE_OTHER): Payer: Medicare Other | Admitting: Family Medicine

## 2010-11-25 DIAGNOSIS — I4891 Unspecified atrial fibrillation: Secondary | ICD-10-CM

## 2010-11-25 DIAGNOSIS — Z5181 Encounter for therapeutic drug level monitoring: Secondary | ICD-10-CM

## 2010-11-25 DIAGNOSIS — Z7901 Long term (current) use of anticoagulants: Secondary | ICD-10-CM

## 2010-11-25 NOTE — Patient Instructions (Signed)
Continue current dose, check in 4 weeks  

## 2010-11-29 ENCOUNTER — Other Ambulatory Visit: Payer: Self-pay | Admitting: *Deleted

## 2010-11-29 NOTE — Telephone Encounter (Signed)
I believe this pts PCP is [name]...will forward for refills.

## 2010-11-30 MED ORDER — PROMETHAZINE HCL 25 MG PO TABS: 25.0000 mg | ORAL_TABLET | Freq: Four times a day (QID) | ORAL | Status: DC | PRN

## 2010-12-06 ENCOUNTER — Telehealth: Payer: Self-pay | Admitting: Radiology

## 2010-12-06 DIAGNOSIS — I4891 Unspecified atrial fibrillation: Secondary | ICD-10-CM

## 2010-12-06 DIAGNOSIS — Z7901 Long term (current) use of anticoagulants: Secondary | ICD-10-CM

## 2010-12-06 DIAGNOSIS — Z5181 Encounter for therapeutic drug level monitoring: Secondary | ICD-10-CM

## 2010-12-06 NOTE — Telephone Encounter (Signed)
Order for INR

## 2010-12-07 ENCOUNTER — Ambulatory Visit: Payer: No Typology Code available for payment source | Admitting: Psychology

## 2010-12-16 ENCOUNTER — Other Ambulatory Visit: Payer: Self-pay | Admitting: *Deleted

## 2010-12-16 MED ORDER — PROMETHAZINE HCL 25 MG PO TABS: 25.0000 mg | ORAL_TABLET | Freq: Four times a day (QID) | ORAL | Status: DC | PRN

## 2010-12-23 ENCOUNTER — Ambulatory Visit (INDEPENDENT_AMBULATORY_CARE_PROVIDER_SITE_OTHER): Payer: Medicare Other | Admitting: Family Medicine

## 2010-12-23 ENCOUNTER — Other Ambulatory Visit: Payer: Self-pay | Admitting: Family Medicine

## 2010-12-23 DIAGNOSIS — I4891 Unspecified atrial fibrillation: Secondary | ICD-10-CM

## 2010-12-23 DIAGNOSIS — Z7901 Long term (current) use of anticoagulants: Secondary | ICD-10-CM

## 2010-12-23 DIAGNOSIS — Z5181 Encounter for therapeutic drug level monitoring: Secondary | ICD-10-CM

## 2010-12-29 ENCOUNTER — Ambulatory Visit (INDEPENDENT_AMBULATORY_CARE_PROVIDER_SITE_OTHER): Payer: No Typology Code available for payment source | Admitting: Psychology

## 2010-12-29 DIAGNOSIS — F331 Major depressive disorder, recurrent, moderate: Secondary | ICD-10-CM

## 2011-01-11 ENCOUNTER — Other Ambulatory Visit: Payer: Self-pay | Admitting: *Deleted

## 2011-01-11 MED ORDER — PROMETHAZINE HCL 25 MG PO TABS: 25.0000 mg | ORAL_TABLET | Freq: Four times a day (QID) | ORAL | Status: DC | PRN

## 2011-01-11 NOTE — Assessment & Plan Note (Signed)
[facility]                            CARDIOLOGY OFFICE NOTE   [name], [name]                        MRN:          [id number]  DATE:01/09/2007                            DOB:          14-Mar-1946    PRIMARY CARE PHYSICIAN:  Julieanne Manson, M.D.   REASON FOR PRESENTATION:  Evaluate patient with mitral regurgitation.   HISTORY OF PRESENT ILLNESS:  The patient is now 65 years old.  She  returns for a 53-month follow up.  She has done well since I last saw  her.  She still gets quite a few palpitations; in fact, today she is in  ventricular bigeminy.  However, these are short lived.  She has no  associated presyncope or syncope.  She can ignore them now.  She does  not get anxious about them.  She is able to walk 3-4 times per week.  She is able to do all of her gardening.  With this, she denies any  shortness of breath.  She has had no PND or orthopnea.  She has had no  chest discomfort, neck discomfort, arm discomfort, activity-induced  nausea, no vomiting or excessive diaphoresis.   PAST MEDICAL HISTORY:  1. Atrial fibrillation status post cardioversion.  2. 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  3. Partial thyroidectomy.  4. Nephrolithiasis.  5. Sinusitis.  6. Asthma.  7. Disk surgery.  8. Status post Cox-Maze procedure.  9. Mitral regurgitation and mitral valve repair by Dr. Cornelius Moras.  10.History of childhood rheumatic fever.   ALLERGIES:  ERYTHROMYCIN.   MEDICATIONS:  1. Coumadin.  2. Toprol 25 mg daily.  3. Synthroid 50 mcg daily.   REVIEW OF SYSTEMS:  As stated in the HPI, and otherwise negative for  other systems.   PHYSICAL EXAMINATION:  GENERAL:  The patient is in no distress.  VITAL SIGNS:  Blood pressure 130/72, heart rate 86 and regular, weight  162 pounds, body mass index of 24.  HEENT:  Eyes unremarkable.  Pupils equal, round and reactive to light.  Fundi not visualized.  Oral mucosa unremarkable.  NECK:   No jugular venous distention.  Waveform within normal limits.  Carotid upstroke brisk and symmetric.  No bruits, no thyromegaly.  LYMPHATICS:  No cervical, axillary or inguinal adenopathy.  LUNGS:  Clear to auscultation bilaterally.  BACK:  No costovertebral angle tenderness.  CHEST:  Well-healed sternotomy scar.  HEART:  PMI not displaced or sustained.  S1 and S2 within normal limits.  No S3, no S4.  No clicks, no rubs, no murmurs.  ABDOMEN:  Flat.  Positive bowel sounds.  Normal in frequency and pitch.  No bruits, rebound or guarding.  No midline pulse.  No mass.  No  organomegaly.  SKIN:  No rashes.  No nodules.  EXTREMITIES:  There were 2+ pulses throughout.  No edema, no cyanosis,  no clubbing.  NEUROLOGIC:  Oriented to person, place, and time.  Cranial nerves II-XII  grossly intact.  Motor grossly intact.   ELECTROCARDIOGRAM:  Sinus rhythm, rate 86.  Axis within normal limits.  Nonspecific T wave  flattening.  No significant change from previous.   ASSESSMENT AND PLAN:  1. Mitral regurgitation.  The patient has had mitral valve repair and      is doing well with this.  She does not need endocarditis      prophylaxis, according to new guidelines.  2. Atrial fibrillation/chronic Coumadin therapy.  This has been a      decision based on her previous atrial fibrillation and also      previous probable embolic infarction.  We have had long discussions      about this, and I have weighed the advantages and disadvantages of      Coumadin and have fallen on the side of continuing it.  She      understands that this is a marginal decision and agrees to proceed      with Coumadin therapy followed by Dr. Sullivan Lone.  3. Palpitations.  The patient has frequent premature ventricular      contractions, but they are not particularly symptomatic.  No      further evaluation is warranted.  4. Follow up.  I will see her back in 6 months in our [place] office.     Rollene Rotunda, MD,  [facility]     JH/MedQ  DD: 01/09/2007  DT: 01/09/2007  Job #: [id number]   cc:   Julieanne Manson

## 2011-01-11 NOTE — Assessment & Plan Note (Signed)
[facility]                            CARDIOLOGY OFFICE NOTE   ALLIA, WILTSEY                        MRN:          [id number]  DATE:10/09/2008                            DOB:          1946/08/19    PRIMARY CARE PHYSICIAN:  Julieanne Manson, MD   REASON FOR PRESENTATION:  Evaluate the patient with mitral  regurgitation, status post repair.  She presents for evaluation of  palpitations.   HISTORY OF PRESENT ILLNESS:  The patient is 65 years old.  She called  today at her cell phone and wanted to schedule because of palpitations.  She has lost quite a bit of weight over the last several months.  It  turns out she probably has depression, is being managed for this.  She  has been anorexic and vomiting.  She is now starting to finally get a  little bit of relief.  She is seeing a psychiatrist and psychologist and  has had her meds managed and recently switched to antidepressants.  However, because of the weight loss, she was concerned there might be  something wrong with her heart.  She has been having occasional  palpitations.  However, she describes isolated beats.  She does not have  any sustained tachyarrhythmias.  She has not had any presyncope or  syncope.  She has not had any chest pain or shortness of breath.   PAST MEDICAL HISTORY:  1. Atrial fibrillation, status post cardioversion.  2. 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  3. Partial thyroidectomy.  4. Nephrolithiasis.  5. Sinusitis.  6. Asthma.  7. Disk surgery.  8. Status post Cox-maze procedure.  9. Mitral regurgitation with mitral valve repair by Dr. Cornelius Moras.  10.History of childhood rheumatic fever.   ALLERGIES:  ERYTHROMYCIN.   MEDICATIONS:  1. Coumadin.  2. Toprol 25 mg daily.  3. Synthroid 50 mcg daily.  4. Doxycycline 50 mg daily.  5. Remeron 30 mg at bedtime.  6. Clonazepam 1 mg b.i.d.  7. Mirtazapine 30 mg at bedtime.  8. Citalopram 40 mg daily.   REVIEW OF SYSTEMS:  As stated in the HPI, and otherwise negative for  other systems.   PHYSICAL EXAMINATION:  GENERAL:  The patient is in no distress.  VITAL SIGNS:  Blood pressure 104/68, heart rate 86 and regular, and  weight 132 pounds.  HEENT:  Eyelids unremarkable; pupils equal, round, and reactive to  light; fundi not visualized, oral mucosa unremarkable.  NECK:  No jugular venous distention at 45 degrees; carotid upstroke  brisk and symmetric; no bruits, no thyromegaly.  LYMPHATICS:  No adenopathy.  LUNGS:  Clear to auscultation bilaterally.  BACK:  No costovertebral angle tenderness.  CHEST:  A well-healed sternotomy scar.  HEART:  PMI not displaced or sustained; S1 and S2 within normal limits;  no S3, no S4; no clicks, no rubs, no murmurs.  ABDOMEN:  Flat; positive  bowel sounds; normal in frequency and pitch; no bruits, no rebound, no  guarding; no midline pulsatile mass; no hepatomegaly or splenomegaly.  SKIN:  No rashes.  No nodules.  EXTREMITIES:  Pulses 2+, no edema.   EKG; sinus rhythm, rate 86, axis within normal limits, intervals within  normal limits, premature ventricular contractions, no acute ST-T wave  changes.   ASSESSMENT AND PLAN:  1. Palpitations.  The patient is having premature ventricular      contractions.  There was one documented on the EKG today.  However,      she is not having any sustained dysrhythmias.  I do not think she      is having any acute cardiac problems and no further cardiovascular      testing is suggested.  2. Mitral valve repair.  I did an echo last time she was in the      hospital and she has a stable repair with a well-preserved ejection      fraction.  She understands endocarditis prophylaxis.  No further      evaluation is planned at this point.  3. Depression/anxiety.  The patient seems to be on a good course of      treatment and I encouraged her to continue with this.  4. Atrial fibrillation, status post cardioversion.   She is tolerating      Coumadin.  She will remain on this.  She is currently maintaining      sinus rhythm.  5. Followup.  She is due to see me in November and she will keep this      appointment.  Of note, I have asked her to discuss with her primary      care doctor whether she needs a referral to a GI doctor given the      fact that she has had so much nausea and vomiting.  This may all be      depression, but primary GI problem also needs to be considered.     Rollene Rotunda, MD, [facility]  Electronically Signed    JH/MedQ  DD: 10/09/2008  DT: 10/10/2008  Job #: [id number]   cc:   Julieanne Manson

## 2011-01-11 NOTE — Assessment & Plan Note (Signed)
[facility]                            CARDIOLOGY OFFICE NOTE   Deborah, [name]                        MRN:          [id number]  DATE:07/23/2007                            DOB:          08-14-1946    PRIMARY CARE PHYSICIAN:  Dr. Julieanne Manson.   REASON FOR PRESENTATION:  Evaluate patient with mitral regurgitation,  atrial fibrillation.   HISTORY OF PRESENT ILLNESS:  The patient is now 65 years old.  She has  done well since I last saw her.  She has had no new palpitations,  presyncope, or syncope.  She has had no chest pain or shortness of  breath.  She is not getting as anxious as she used to.  She thinks  overall she is doing very well.   PAST MEDICAL HISTORY:  Atrial fibrillation, status post cardioversion,  previous superior cerebellar infarct noted on an MRI, partial  thyroidectomy, nephrolithiasis, sinusitis, asthma, disk surgery, status  post Cox-Maze procedure, mitral regurgitation and mitral valve repair by  Dr. Cornelius Moras, history of childhood rheumatic fever.   ALLERGIES:  ERYTHROMYCIN.   MEDICATIONS:  1. Synthroid 50 mcg daily.  2. Toprol 25 mg daily.  3. Coumadin per Dr. Sullivan Lone.   REVIEW OF SYSTEMS:  As stated in the HPI and otherwise negative for  other systems.   PHYSICAL EXAMINATION:  The patient is in no distress.  Blood pressure 118/58, heart rate 158 and regular.  NECK:  No jugular venous distension at 45 degrees, carotid upstroke  brisk and symmetrical.  No bruit.  No thyromegaly.  LUNGS:  Clear to auscultation bilaterally.  HEART:  PMI not displaced or sustained.  S1 and S2 are within normal  limits.  No S3, no S4.  No clicks, no rubs, no murmurs.  ABDOMEN:  Flat, positive bowel sounds, normal in frequency and pitch.  No bruits, no rebound, no guarding.  No midline pulsatile mass.  No  organomegaly.  SKIN:  No rashes, no nodules.  EXTREMITIES:  2+ pulses, no edema.  NEURO:  Grossly intact.   EKG sinus rhythm, rate  83, axis within normal limits, intervals within  normal limits, inferolateral T-wave inversions, unchanged from previous.   ASSESSMENT AND PLAN:  1. Atrial fibrillation:  The patient is maintaining sinus rhythm.  No      further cardiovascular testing is suggested.  2. Mitral valve repair:  This is holding up nicely clinically.  No      further cardiovascular testing is suggested.  Her last      echocardiogram was a year ago.  There is no need to repeat this at      this point.  3. Followup:  I can see her yearly or sooner if there are any      problems.     Rollene Rotunda, MD, [facility]  Electronically Signed    JH/MedQ  DD: 07/23/2007  DT: 07/23/2007  Job #: [id number]   cc:   Julieanne Manson

## 2011-01-11 NOTE — Assessment & Plan Note (Signed)
[facility]                            CARDIOLOGY OFFICE NOTE   MARSI, TURVEY                        MRN:          [id number]  DATE:07/04/2008                            DOB:          05/19/1946    PRIMARY CARE PHYSICIAN:  Dr. Julieanne Manson   REASON FOR PRESENTATION:  Evaluate the patient with mitral regurgitation  status post repair.   HISTORY OF PRESENT ILLNESS:  The patient is 65 years old.  She returns  for yearly followup.  She is actually coming back a little sooner  because she has had some problems.  She has been very depressed and  anxious and is actually having this managed.  She recently started  Remeron.  With this, she has had some palpitations.  These have been  isolated skipped beats.  She has not had any sustained tachyarrhythmias.  She does not have any presyncope or syncope.  She has had some chest  discomfort.  This is only when she lies back at night.  It goes away if  she lies on her left or right side.  She has not had any substernal  chest pressure with activity and has had no neck or arm discomfort.  She  has been battling depression and has lost quite a bit of weight.  She is  probably not as active as she was.   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 Cornelius Moras, history of childhood rheumatic fever.   ALLERGIES:  ERYTHROMYCIN.   MEDICATIONS:  1. Coumadin.  2. Toprol 25 mg daily.  3. Synthroid 50 mcg daily.  4. Doxycycline.  5. Remeron 30 mg nightly.   REVIEW OF SYSTEMS:  As stated in the HPI, and otherwise negative for  other systems.   PHYSICAL EXAMINATION:  GENERAL:  The patient is pleasant and in no  distress.  VITAL SIGNS:  Blood pressure 118/38, heart rate is 90 and  regular.  HEENT:  Eyelids unremarkable.  Pupils are equal, round, and reactive to  light.  Fundi not visualized.  Oral mucosa unremarkable.  NECK:  No jugular venous distention at 45 degrees.  Carotid upstroke is  brisk and symmetrical.  No bruits.  No thyromegaly.  LYMPHATICS:  No cervical, axillary, or inguinal adenopathy.  LUNGS:  Clear to auscultation bilaterally.  BACK:  No costovertebral angle tenderness.  CHEST:  Unremarkable.  HEART:  PMI not displaced or sustained.  S1 and S2 within normals.  No  S3, no S4.  No clicks.  No rubs.  No murmurs.  ABDOMEN:  Flat, positive bowel sounds.  Normal in frequency and pitch.  No bruits, rebound, or guarding.  No midline pulsatile mass.  No  hepatomegaly.  No splenomegaly.  SKIN:  No rashes.  No nodules.  EXTREMITIES:  2+ pulse, no edema.   ASSESSMENT AND PLAN:  1. Mitral valve repair.  The patient has not had an echocardiogram  in      2 years.  I do not hear any significant regurgitant murmur.      However, this does need to be followed as there is a percentage of      repair failures.  I will order an echocardiogram.  2. Atrial fibrillation.  I doubt that the patient is having any      paroxysms of atrial fibrillation.  She is in sinus now.  She is on      Coumadin.  No further therapy is warranted.  I suspect her      palpitations are somewhat related to her anxiety.  3. Depression/anxiety.  She is going to have this follow by Dr.      Sullivan Lone.  Hopefully, we will find medical regimen that will improve      this.  It might be beneficial to consider referral to a      psychologist as well.  4. Followup.  I will see her back in 1 year or sooner if needed.     Rollene Rotunda, MD, [facility]  Electronically Signed    JH/MedQ  DD: 07/04/2008  DT: 07/05/2008  Job #: [id number]   cc:   Julieanne Manson

## 2011-01-14 NOTE — Consult Note (Signed)
[name], [name]                             ACCOUNT NO.:  [id number]   MEDICAL RECORD NO.:  [id number]                   PATIENT TYPE:  OIB   LOCATION:  6501                                 FACILITY:  [facility]   PHYSICIAN:  Salvatore Decent. Cornelius Moras, M.D.              DATE OF BIRTH:  05/14/1946   DATE OF CONSULTATION:  09/16/2003  DATE OF DISCHARGE:  09/16/2003                                   CONSULTATION   REFERRING PHYSICIAN:  Rollene Rotunda, M.D.   PRIMARY CARE PHYSICIAN:  Dr. Julieanne Manson   REASON FOR CONSULTATION:  Severe mitral regurgitation, persistent atrial  fibrillation.   HISTORY OF PRESENT ILLNESS:  Mrs. [name] is a 65 year old white female from  [place] with longstanding history of mitral regurgitation, mitral valve  prolapse, and rheumatic fever. She was referred to Dr. Antoine Poche in April  2004 for evaluation of persistent atrial fibrillation. She was started on  Coumadin and a calcium channel blocker at that time. She has continued to  follow up with Dr. Antoine Poche since then. She returned for a follow-up  December 2004 and at that time a 2-D echocardiogram was performed at [facility]. This demonstrated the presence of at least moderate to severe  mitral regurgitation with markedly enlarged left atrium and moderate  dilatation of the left ventricle, but otherwise preserved left ventricular  systolic function. Because of progressive chamber enlargement and concerns  regarding the severity of her underlying mitral regurgitation, she has  subsequently undergone transesophageal echocardiogram on January 14. This  was performed by Dr. Charlton Haws who confirmed the presence of moderate to  severe mitral regurgitation with associated left atrial and left ventricular  chamber enlargement, annular dilatation of the mitral valve annulus and  bileaflet prolapse with thickened inferior and posterior leaflets of the  mitral valve. Mrs. Santmyer was brought in for elective left and  right heart  catheterization today by Dr. Antoine Poche. This demonstrates normal coronary  artery anatomy with no significant coronary artery disease. Pulmonary artery  pressures were relatively normal, averaging 21/10 with a pulmonary capillary  wedge pressure of 12. Cardiac surgical consultation has been requested.   REVIEW OF SYSTEMS:  GENERAL:  The patient reports feeling well, although  over the last year or so she does report considerable worsening of  exertional fatigue. Her appetite is good and she has not been gaining or  loosing weight. CARDIAC:  The patient reports a longstanding history of  palpitations dating back to her childhood at which time she was told she had  a history of rheumatic fever. She has recently also developed episodes where  she feels her heart thumping and notes that associated with this is a  certain degree of mild heaviness in the chest. She denies any problems with  shortness of breath, although she does note that she gets short of breath  when she goes up several flights of  stairs. This has slowly progressed in  recent years. She denies any problems with resting shortness of breath, PND,  orthopnea, or lower extremity edema.  She denies any syncopal episodes or  dizzy spells. RESPIRATORY:  Notable for fairly persistent dry cough. The  patient attributes this to underlying seasonal allergies, but she notes that  she seems to have a cough more often than not and she still occasionally  will have this cough even when she is not having problems with sinus  drainage. She denies hemoptysis, productive cough, or wheezing. She has been  told that she has mild asthma. GASTROINTESTINAL:  Negative. The patient  denies difficulty swallowing. The patient denies problems with hematochezia,  hematemesis, melena. Bowel function is regular. She has occasional mild  dyspepsia, which she treats with Prilosec. MUSCULOSKELETAL:  Negative. The  patient has mild residual problems  with her lower back and associated  numbness involving the left lower extremity related to degenerative disk  disease  of the spine. This has not been problematic. She otherwise denies  problems with arthritis or arthralgias. NEUROLOGIC:  Notable for the absence  of any history of transient monocular blindness or transient numbness or  weakness involving either upper or lower extremity. She has mild persistent  numbness involving the left lower extremity dating back many years. She has  occasional problems with migraine headaches that seem to be stress related.  She denies any history of seizures. HEMATOLOGIC:  Negative. The patient  denies known problems with anemia, difficulty bleeding, easy bruising, or  other bleeding diaphysis. She has done well on Coumadin therapy since last  April. ENDOCRINE:  Negative. The patient denies a history of diabetes or  thyroid disorder. GENITOURINARY:  Notable for chronic problems with burning  with urination and occasional urinary frequency. She has recently developed  stress urinary incontinence. HEENT:  Negative. The patient reports no  transient visual disturbances or recent changes in her eye site. She has  good dentition and sees her dentist fairly regularly and always takes  antibiotics due to her known history of mitral valve prolapse. She states  that it has probably been one year since her last dental exam. PSYCHIATRIC:  Negative, although the patient has been under some stress recently as she  has been caring for her mother-in-law at home. She is also somewhat anxious  particularly with respect to her underlying medical diagnosis.   PAST MEDICAL HISTORY:  1. Mitral regurgitation.  2. Persistent/permanent atrial fibrillation, dating back at least to April     2004.  3. Reported history of rheumatic fever, childhood.  4. Mitral valve prolapse.  5. Mild asthma.  6. Intermittent sinusitis and allergic rhinitis.  7. GE reflux disease. 8.  Nephrolithiasis.  9. Migraine headaches.  10.      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   The patient specifically denies any known history of hypertension,  myocardial infarction, congestive heart failure, diabetes mellitus, previous  stroke.   PAST SURGICAL HISTORY:  1. Lumbar laminectomy and diskectomy x2.  2. Open surgical removal of large kidney stone.  3. Hysterectomy.   FAMILY HISTORY:  Noncontributory and notable for the absence of a history of  family with valvular heart disease.   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 She has been  retired for the past year and remains active physically. She is married and  lives with her husband. They have two  grown children. She is a nonsmoker and  denies a history of significant alcohol consumption.   CURRENT MEDICATIONS:  1. Coumadin 8 mg every Tuesday, Thursday, Saturday, and Sunday; 7 mg every     Monday, Wednesday, Friday.  2. Cardizem CD 180 mg once daily.  3. Ditropan XL 1 tablet daily.  4. Advair discus 2 puffs daily.  5. Allegra D 1 tablet daily.  6. Prilosec 20 mg as needed for indigestion.   ALLERGIES:  The patient reports sensitivity to Erythromycin.   PHYSICAL EXAMINATION:  GENERAL:  Notable for a well-appearing white female  who appears the stated age in no acute distress.  VITAL SIGNS:  She is currently in atrial fibrillation with a heart rate in  the 90s. She is afebrile. She is normotensive.  HEENT:  Essentially unrevealing.  NECK:  Supple. There is no cervical nor supraclavicular lymphadenopathy.  There is no jugular vein distention. No carotid bruits are noted.  CHEST:  Auscultation of the chest reveals clear and symmetrical breath  sounds bilaterally. No wheezes or rhonchi are noted.  CARDIOVASCULAR:  Irregular heart rhythm. There is a grade 4/6 holosystolic  murmur heard best at the apex with  radiation to the axilla. No diastolic  murmurs are noted.  ABDOMEN:  Mildly obese, but soft and nontender. The liver edge is not  palpable. Bowel sounds are present.  EXTREMITIES:  Warm and well-perfused. There is no lower extremity edema.  There is no sign of venous insufficiency. Distal pulses are easily palpable  in the posterior tibial position in both lower legs at the ankle.  RECTAL/GU:  Deferred.  NEUROLOGIC:  Grossly nonfocal and symmetrical throughout.   LABORATORY DATA:  Transesophageal echocardiogram performed by Dr. Eden Emms on  January 14 has been reviewed. This demonstrates the presence of moderate to  severe mitral regurgitation. The left atrium is severely enlarged and the  left ventricle is mild to moderately enlarged. There is bileaflet prolapse  of the mitral valve with particular prolapse involving the anterior leaflet  more so than the posterior leaflet. There is a broad based central jet of mitral regurgitation which is directed slightly posteriorly. Both mitral  valve leaflets are somewhat thickened and there is somewhat restricted  mobility of the posterior leaflet. The subvalvular apparatus does not appar  foreshortened. The mitral valve annulus is dilated.  The aortic valve  appears normal. No other significant abnormalities are identified.   Cardiac catheterization performed today by Dr. Antoine Poche has been reviewed  and demonstrates normal coronary artery anatomy with right dominant coronary  circulation. There is moderate to severe mitral regurgitation and mild  global hypokinesis of the left ventricle with severe mitral regurgitation.   IMPRESSION:  Moderate to severe mitral regurgitation with progressive  chamber enlargement of the left ventricle, severe left atrial enlargement,  mild progression of exertional shortness of breath, persistent atrial  fibrillation. I believe that Mrs. Chamberland would best be treated by elective  mitral valve repair/replacement.  Based upon the appearance of her valve on  transesophageal echocardiogram, I believe there is better than 50% that her  valve will be repairable. Although both leaflets are somewhat thickened and  there is bileaflet prolapse with some restriction of leaflet mobility of the  posterior leaflet. It is difficult to know for certain the duration of her  mitral regurgitation. It is possible that the underlying etiology may be a  combination of degenerative disease, rheumatic disease, and/or possibly some  degree of underlying congenital abnormality. Nevertheless, there remains  a  significant chance that her valve will be repairable. Under the  circumstances, she might also benefit from concomitant modified Cox-Maze  procedure. The long term success of the Maze procedure may be somewhat  diminished in this patient due to the massive chamber enlargement of the  left atrium and possible longstanding history of atrial fibrillation.  Nevertheless, the associated risks of this procedure are probably relatively  low under the circumstances and the potential benefits significant in the  setting of concomitant mitral valve repair.   PLAN:  I have outline options at length with Mrs. Labrecque and her husband.  Alternative treatment strategies have been discussed in detail. The  rationale for proceeding with surgery at this junction has been reviewed  versus an alternative approach with continued conservative aggressive close  follow-up. Alternative surgical approaches have been reviewed. All of their  questions have been addressed. If her valves were not repairable at the time  of surgery, we would recommend placement of a mechanical prosthesis due to  her relatively young age. At this junction, Mrs. Branscomb seems fairly  comfortable with the decision of proceeding with surgery, although she  desires to think things over in the short term and contact our office in the near future to schedule surgery at that  time. All of her questions have been  addressed. Both she and her husband understand and accept all associated  risks of surgery including, but  not limited risk of death, stroke, myocardial infarction, congestive heart  failure, respiratory failure, pneumonia, bleeding requiring blood  transfusion, arrhythmia, heart block or bradycardia requiring permanent  pacemaker, recurrent mitral regurgitation, recurrent atrial fibrillation,  need for mechanical valve replacement.                                               Salvatore Decent. Cornelius Moras, M.D.    CHO/MEDQ  D:  09/16/2003  T:  09/17/2003  Job:  [id number]   cc:   Rollene Rotunda, M.D.   [facility] Lone  [address]  [place]  [place] [address]  Fax: [phone]

## 2011-01-14 NOTE — Op Note (Signed)
[name], [name]                             ACCOUNT NO.:  [id number]   MEDICAL RECORD NO.:  [id number]                   PATIENT TYPE:  INP   LOCATION:  2306                                 FACILITY:  [facility]   PHYSICIAN:  Salvatore Decent. Cornelius Moras, M.D.              DATE OF BIRTH:  January 31, 1946   DATE OF PROCEDURE:  10/15/2003  DATE OF DISCHARGE:                                 OPERATIVE REPORT   PREOPERATIVE DIAGNOSIS:  Severe mitral regurgitation, persistent atrial  fibrillation.   POSTOPERATIVE DIAGNOSIS:  Severe mitral regurgitation, persistent atrial  fibrillation.   PROCEDURE:  Median sternotomy for mitral valve repair (quadrangular  resection of posterior leaflet x 2, artificial Gore-Tex cords to anterior  leaflet x 2, sliding leaflet annuloplasty and resection of fibrotic chordee  tendineae, #34 mm Seguin annuloplasty ring), and modified Cox Maze  procedure.   SURGEON:  Salvatore Decent. Cornelius Moras, M.D.   ASSISTANT:  Coral Ceo, P.[name].   ANESTHESIA:  General.   BRIEF CLINICAL NOTE:  The patient is [name] 65 year old white female followed by  Dr. Julieanne Manson and Dr. Rollene Rotunda with long-standing history of  rheumatic heart disease, mitral valve prolapse, mitral regurgitation, and  atrial fibrillation.  She has been followed for several months by Dr.  Antoine Poche and treated for persistent atrial fibrillation.  She now presents  with progressive symptoms of exertional shortness of breath.  Transesophageal echocardiogram confirms the presence of severe mitral  regurgitation with bileaflet prolapse.  [name] full consultation note has been  dictated previously.   OPERATIVE CONSENT:  The patient has been counseled at length regarding the  indications and potential benefits of mitral valve repair.  The rationale  for proceeding at the current juncture has been discussed and treatment  options have been reviewed in particular including continued close  observation with delay of surgical  intervention.  The rationale for  concomitant Maze procedure in an effort to treat the patient's underlying  history of persistent atrial fibrillation has been discussed.  The possible  need for mechanical valve replacement has been discussed in the event that  satisfactory valve repair is not feasible.  The patient understands and  accepts all the associated risks of surgery including but not limited to the  risks of death, stroke, myocardial infarction, congestive heart failure,  respiratory failure, pneumonia, bleeding requiring blood transfusion,  arrhythmia, heart block or bradycardia requiring permanent pacemaker, late  recurrence of atrial fibrillation, late recurrence of mitral valve disease,  possible need for prosthetic valve replacement.  All her questions have been  addressed.   OPERATIVE NOTE IN DETAIL:  The patient was brought to the operating room on  the above mentioned date and placed in [name] supine position on the operating  table.  Central monitoring is established by the anesthesia service under  the care and direction of Dr. Jairo Ben.  Specifically, [name] Swan-Ganz  catheter was placed through  the right internal jugular approach.  [name] radial  arterial line was placed.  Intravenous antibiotics are administered.  Following induction with general endotracheal anesthesia, baseline  transesophageal echocardiogram is performed by Dr. Jean Rosenthal.  This confirmed  the presence of mitral regurgitation which is at least moderate in severity.  There is bileaflet prolapse involving the mitral valve.  There is some  restriction of portions in the posterior leaflets although there is also  prolapse of portions of the posterior leaflets.  Portions of both anterior  and posterior leaflets appear thickened.  The chordee tendineae to the  anterior leaflet are very elongated but there are no flail segments.  Left  ventricular function appears mildly globally reduced but overall intact.  The  aortic valve appears normal.  No other significant abnormalities are  identified.   The patient's chest, abdomen, both groins, and both lower extremities are  prepped and draped in [name] sterile manner.  [name] median sternotomy incision was  performed.  The pericardium was opened.  The patient is heparinized  systemically.  The ascending aorta is cannulated for cardiopulmonary bypass.  [name] venous cannula is placed directly in the superior vena cava and [name] second  cannula is placed low in the right atrium with tip extending down the  inferior vena cava.  [name] retrograde cardioplegia catheter is placed through  the right atrium into the coronary sinus.  Cardiopulmonary bypass was begun  and the surface of the heart is inspected.  There is biventricular  enlargement but overall the heart appears normal.  There is severe left  atrial enlargement.  [name] temperature probe was placed in the left ventricular  septum.  [name] cardioplegia catheter was placed in the ascending aorta.  Umbilical loops are placed around the superior vena cava and the inferior  vena cava.  The patient was cooled to 28 degrees systemic temperature.  The  aortic Cross clamp was applied and cardioplegia delivered initially in an  antegrade fashion through the aortic root.  Ice saline slush is applied for  topical hypothermia.  Supplemental cardioplegia is administered retrograde  through the coronary sinus catheter.  The initial cardioplegic arrest and  myocardial cooling are felt to be excellent.  Repeat doses of cardioplegia  are administered intermittently throughout the crossclamp portion of the  operation retrograde through the coronary sinus catheter to maintain septal  temperature below 15 degrees Centigrade.   The table is rotated towards the surgeons side and the heart is retracted  towards the surgeons side to expose the reflection of the pericardium surrounding the left pulmonary vein.  Blunt dissection is utilized to  develop [name]  plane behind the origin of the pulmonary veins around the left  posterolateral surface of the left atrium.  The Medtronic Cardioblade  irrigated bipolar radiofrequency ablation device is utilized to create an  elliptical ablation lesion surrounding the origin of the left pulmonary vein  along the wall of the left atrium.  This is technically straight forward.  After completion of this elliptical lesion in the usual fashion, [name] second  elliptical lesion is placed around the base of the left atrial appendage.  Of note, the left atrial appendage is quite large.   The table is now rotated away from the surgeons side.  [name] left atriotomy  incision is performed posteriorly through the intra-atrial groove.  This  atriotomy incision encircles more than 180 degrees around the origin of the  right pulmonary vein.  The remainder of the ellipse around the right  pulmonary vein is then ablated using the bipolar irrigated radiofrequency  ablation device with one limb of the device placed posterior to the left  atrium and the other limb within the left atrium.  Following this, two  longitudinal ablation lesions are created across the floor of the left  atrium.  The first crosses the roof of the left atrium from the caudal most  extension of the atriotomy incision and extends to the elliptical lesion  surrounding the left pulmonary veins.  This is, again, performed with one  limb of the bipolar device placed posterior to the left atrium and the other  limb within the left atrium.  [name] second parallel lesion is placed from the  other end of the atriotomy incision and is also placed across the floor of  the left atrium to join the lesion surrounding the right sided veins with  the lesions surrounding the left sided veins.  Following this, another  bipolar lesion is created extending from the inferior most apex of the  atriotomy incision toward the mitral valve annulus across the floor of the  left atrium.  This  is performed with the bipolar device, again, using one  limb of the device within the left atrium and the other limb posterior to  the left atrium.  Following this, the unipolar irrigated radiofrequency  ablation device is utilized to complete this lesion all the way to the  mitral valve annulus.  In addition, [name] short lesion is constructed to connect  the elliptical lesion surrounding the left sided pulmonary veins with the  elliptical lesion surrounding the right sided pulmonary veins.  This  completes the entire left side lesion set of the Cox Maze procedure and is  identical to the lesion set utilized in the original Cox Maze III procedure.   Attention is now directed to the mitral valve.  The mitral valve is exposed  using [name] self-retaining retractor.  Exposure is felt to be excellent.  The  mitral valve is very large with [name] dilated annulus.  Both the anterior and posterior leaflets are moderately thickened but they all move well and are  flexible.  There is prolapse involving the A2 and A3 portion of the anterior  leaflet.  There are two heavily fibrotic and foreshortened cords to the  posterior leaflet which appear to be involved with rheumatic disease.  There  is one mildly fibrotic cord to the anterior leaflet, but the remainder of  the chordee tendineae to both leaflets all appear essentially normal with  somewhat elongation of the cords to the prolapsed portion of the anterior  leaflet.  The remainder of the subvalvular apparatus is normal and the valve  is felt to be repairable.  The two areas of prolapse in the posterior  leaflet are resected using two small quadrangular leaflet resections.  One  is located just to the anterior side of midline of the P2 portion of the  posterior leaflet and the other is located just toward the posterior side of  the P2 portion.  The intervening leaflet in the middle is normal and  resection of this entire portion is felt to be too much  resection  necessitating two separate quadrangular resections.  The foreshortened  fibrotic cords of the posterior leaflet are divided to facilitate leaflet  exposure and [name] small sliding annuloplasty is performed involving the P2  portion of the posterior leaflet.   While the leaflets are opened and exposed, two CV4 Gore-Tex pledgeted  sutures are  placed to be utilized as artificial cords to the anterior  leaflet.  One is fixed to the head of the anterior papillary muscle and [name]  second is fixed to the head of the posterior papillary muscle.  The Gore-Tex  sutures are then gently placed in the left ventricular cavity to be  retrieved later for attachment to the anterior leaflet.  Two figure-of-eight  horizontal mattress 2-0 Ethibond compression sutures are placed at the  locations of the quadrangular resection.  The leaflets are then repaired  with short sliding leaflet annuloplasty to the P3 portion of the posterior  leaflet that is reattached to the posterior annulus with [name] two layer closure  of running 4-0 Ethibond suture.  The intervening vertical defect left over  from both of the quadrangular resections are then closed using interrupted 5-  0 Ethibond sutures.  Ring annuloplasty is performed using interrupted 2-0  Ethibond horizontal mattress sutures placed circumferentially around the  entire mitral valve annulus.  The valve is sized to approximately [name] 34 mm  annuloplasty ring.  [name] 34 mm Seguin annuloplasty ring is secured in place  uneventfully.  After completion of the annuloplasty ring placement, the two  Gore-Tex cords are retrieved from the left ventricular chamber.  These are  then fixed to the free edge of the anterior leaflet in the A2 and slightly  to the A3 portion of the anterior leaflet to correct the anterior leaflet  prolapse.  These are tied down after filling the left ventricular chamber with saline to identify the appropriate length for Gore-Tex cord shortening.   After completion of this, the mitral valve is inspected and appears to be  perfectly competent with symmetrical line coaptation, no residual prolapse,  and no sign of any residual regurgitation.   Rewarming is begun.  The left atrial appendage is oversewn from within the  left atrium using [name] two layer closure of running 3-0 Prolene suture.  An  oblique right atriotomy incision is performed extending from just anterior  to the inferior pulmonary vein towards the acute margin of the heart.  The  apex of the right atrial appendage is amputated.  The bipolar irrigated  radiofrequency ablation device was then utilized to create longitudinal  ablation line along the posterior margin of the right atrium extending in [name]  cephalad direction onto the posterolateral surface of the superior vena  cava.  [name] second line is then extended the opposite directly onto the  posterolateral surface of the inferior vena cava.  The bipolar device was  then placed with one jaw in the left atrium and the second jaw in the right  atrium to create [name] linear lesion across the intra-atrial septum along the  inferior border of the fossa ovalis.  Finally, another bipolar lesion is  placed through the small incision at the apex of the right atriotomy and  extended laterally in orientation at right angles to the other atriotomy  incision.  Approximately 2.5 cm distance is left between these two.  Finally, [name] bipolar lesion is extended from the apex of the atrial appendage  towards the acute margin of the heart.   The left atriotomy incision is closed using [name] two layered closure of running  3-0 Prolene suture.  The patient is placed in Trendelenburg position.  One  final dose of retrograde hot shot cardioplegia is administered.  The aortic  Cross clamp was removed after [name] total crossclamp time of 138 minutes.  The  remainder of the right  sided lesion set of the Cox Maze procedure is now  completed using the unipolar irrigated  radiofrequency device.  First, [name]  lesion is completed from the inferior limb of the fossa ovalis to the  inferior rim of the coronary sinus, around the coronary sinus, and then  across the isthmus to the tricuspid valve annulus.  This lesion is then  joined across the isthmus to the origin of the inferior vena cava.  [name] small  lesion was then completed from the apex of the right atriotomy incision to  the tricuspid valve annulus along the anterolateral border of the annulus.  Then finally, one last short lesion is completed to the tricuspid annulus  joining the previous bipolar lesion located along the posterolateral aspect  of the tricuspid annulus.  This completes the right sided lesion set of the  Cox Maze procedure.  The two right atriotomy incisions are closed using [name]  two layer closure of running 4-0 Prolene suture.   Normal sinus rhythm resumed spontaneously.  Epicardial pacing wires are affixed to the right ventricular outflow tract and to the right atrial  appendage.  All surgical incisions are inspected for hemostasis.  Ventilation is begun and the heart allowed to fill.  Transesophageal  echocardiogram is utilized to verify no residual air within the left heart  circulation.  The IVC cannula is removed and its cannulation site doubly  oversewn with suture ligatures.  The patient is weaned from cardiopulmonary  bypass without difficulty.  The patient's rhythm at separation from bypass  is normal sinus rhythm.  No inotropic support is required.  Total  cardiopulmonary bypass time for the operation is 177 minutes.  Follow up  transesophageal echocardiogram performed by Dr. Jean Rosenthal after separation  from bypass demonstrates preserved left ventricular  function with [name] well  seated mitral valve annuloplasty ring.  The valve appears to be functioning  normally with no residual regurgitation, at all.  There is no sign of any  systolic anterior motion of the mitral valve.  No other  abnormalities are  identified and there is no residual air.  The aortic root vent is removed.  The SVC cannula is removed.  The SVC cannulation site is doubly oversewn  with Prolene suture.  The aortic cannula is removed uneventfully.  Protamine  is administered to reverse the anticoagulation.  The mediastinum is  irrigated with saline solution containing vancomycin.  Meticulous surgical  hemostasis is ascertained.   The mediastinum and the right pleural space are drained using three chest  tubes placed through separate stab incisions inferiorly.  The median  sternotomy incision is closed in routine fashion and the skin incision is  closed with subcuticular skin closure.  The patient tolerated the procedure  well and is transported to the surgical intensive care unit in stable  condition.  There are no interoperative complications.  All sponge,  instrument, and needle counts were verified as correct at the completion of  the operation.  The patient was transfused 2 units of packed red blood cells  during cardiopulmonary bypass due to anemia.  The patient was transfused one  ten pack of platelets due to thrombocytopenia after separation from bypass.                                               Salvatore Decent. Cornelius Moras, M.D.    CHO/MEDQ  D:  10/15/2003  T:  10/15/2003  Job:  [id number]   cc:   Rollene Rotunda, M.D.   Charlton Haws, M.D.   Richard [facility] Lone  [address]  [place]  [place] [address]  Fax: [phone]

## 2011-01-14 NOTE — Assessment & Plan Note (Signed)
[facility]                              CARDIOLOGY OFFICE NOTE   [name], [name]                        MRN:          [id number]  DATE:06/07/2006                            DOB:          01/04/1946    PRIMARY PHYSICIAN:  Dr. Julieanne Manson   REASON FOR PRESENTATION:  Patient with mitral regurgitation and atrial  flutter.   HISTORY OF PRESENT ILLNESS:  The patient is a 65 year old white female with  the above problems.  She presents for 6 month followup.  Since I last saw  her, she did have a follow up with our PA.  We did maintain the Coumadin  because she is having some palpitations.  She and I discussed this over the  phone.  She also has a history of previous stroke.  I think she is  relatively high risk for future cardioembolic events.  She states she has  had some trouble regulating this and it has bounced around from an INR of 5  to an INR sub therapeutic recently, but this is being followed by Dr.  Sullivan Lone.  There has been apparently a long period where it was relatively  steady and easy to manage.  She does report that she does change her diet  seasonally and may eat increased greens occasionally.   She does notice palpitations rarely but has had no sustained  tachyarrhythmias.  She has had no presyncope or syncope.  She is having some  mild occasional incisional chest soreness and still cannot wear under  garments over her sternal site.  She has no substernal chest pain.  She  says her breathing has greatly improved since surgery.  She has no dyspnea  with exertion and denies any PND or orthopnea.  She has no lower extremity  swelling.  She has had some mild discomfort across her back when she gets  tired.   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 Cornelius Moras,  history of childhood rheumatic fever.   ALLERGIES:  To ERYTHROMYCIN.   MEDICATIONS:  1. Coumadin per Dr. Sullivan Lone.  2. Toprol 25 mg daily.  3. Synthroid 50 mcg daily.   REVIEW OF SYSTEMS:  Is stated in the HPI, otherwise negative for other  systems.   PHYSICAL EXAMINATION:  The patient is in no distress.  Blood pressure 112/84, heart rate 85 and regular.  Weight 154 pounds.  Body  mass index 23.  HEENT:  Eyes unremarkable.  Pupils equal, round, reactive to light, fundi  not visualized.  Oral mucosa unremarkable.  NECK:  No jugular venous distention. Waveform within normal limits.  Carotid  upstroke brisk and symmetric, no bruits, no thyromegaly.  LYMPHATICS:  No cervical, axillary, inguinal adenopathy.  LUNGS:  Clear to auscultation bilaterally.  BACK:  No costovertebral line tenderness.  CHEST:  Well healed sternotomy scar.  HEART:  PMI not displaced or sustained.  S1 and S2 within  normal limits.  No  S3, no S4, no systolic murmurs, diastolic murmurs.  ABDOMEN:  Flat, positive bowel sounds.  Normal frequency, no pitch, no  bruits, no rebound, no guarding, no midline pulsatile mass, no splenomegaly,  no hepatosplenomegaly.  SKIN:  No rashes.  No nodules.  EXTREMITIES:  2+ pulses throughout.  No edema.  No cyanosis or clubbing.  NEUROLOGIC:  Oriented to person, place and time. Cranial nerves II through  XII grossly intact.  Motor grossly intact.   EKG:  Sinus rhythm, rate 85, axis within normal limits, intervals within  normal limits, nonspecific lateral T-wave flattening.   ASSESSMENT AND PLAN:  1. Atrial fibrillation.  The patient has had no symptomatic recurrence of      this, although I cannot be sure that she is not doing this transiently      with the palpitation she is describing.  Given this and her evidence of      cerebral vascular accident on an MRI she will continue with the      Coumadin.  She  and I have discussed the risk and benefits of this at      length and she understands and agrees to proceed with the Coumadin.  2. Mitral regurgitation.  She is status post repair.  I am going to      evaluate her with an echocardiogram as it has been about 18 months.      Currently she is doing well and  we will manage this based on future      symptoms, changes in her physical exam or echo.  3. Palpitations as above.  She is going to remain on the Coumadin.  She      will also remain on the low dose Toprol.  These are not particularly      problematic.  4. Chronic Coumadin therapy.  Dr. Sullivan Lone wanted to know whether I thought      she should see a hematologist.  I suspect that her problems are more      related to dietary intake of vitamin K rather than any hematologic      disorder and we discussed this at length.  At this point,  I would not      suggest that hematology evaluation is needed.  She will continue to      work with Dr. Sullivan Lone for Coumadin management.  5. Followup.  I will see her back in about 6 months and perhaps yearly      thereafter if she is doing well.            ______________________________  Rollene Rotunda, MD, [facility]     JH/MedQ  DD:  06/07/2006  DT:  06/09/2006  Job #:  [id number]   cc:   Julieanne Manson

## 2011-01-14 NOTE — Cardiovascular Report (Signed)
[name], [name] NO.:  [id number]   MEDICAL RECORD NO.:  [id number]                   PATIENT TYPE:  OIB   LOCATION:  6501                                 FACILITY:  [facility]   PHYSICIAN:  Rollene Rotunda, M.D.                DATE OF BIRTH:  1946/02/23   DATE OF PROCEDURE:  09/16/2003  DATE OF DISCHARGE:  09/16/2003                              CARDIAC CATHETERIZATION   PRIMARY CARE DOCTOR:  Dr. Julieanne Manson.   PROCEDURES:  Right and left heart catheterizations/coronary arteriography.   INDICATIONS:  Evaluate a patient with severe mitral regurgitation.   PROCEDURE NOTE:  Left heart catheterization was performed via the right  femoral artery.  The artery was cannulated using anterior wall puncture.  A  #4 French arterial sheath was inserted via the modified Seldinger technique.  The right heart catheterization was performed via the right femoral vein.  It took multiple attempts to cannulate this vessel, which was eventually  cannulated using an anterior wall puncture.  A modified Seldinger technique  was utilized to insert a #8 [place] venous sheath.  Preformed Judkins,  pigtail, and a Swan-Ganz catheter were utilized.  The patient tolerated the  procedure well.   RESULTS:   HEMODYNAMIC DATA:  RA mean 3, RV 28/4, PA 21/10 with a mean of 16, pulmonary  capillary wedge pressure mean 12 with a large V wave, left ventricle 103/25,  aorta 103/71.  Cardiac output (thermodilution technique) 2.5.   CORONARY ARTERIOGRAPHY:  1. The left main was normal.  2. The LAD was normal.  3. The circumflex was normal.  4. The right coronary artery was a large dominant vessel and was normal.   LEFT VENTRICULOGRAPHY:  The left ventriculogram was obtained in the RAO  projection.  The EF was 50% with global hypokinesis.  There seemed to be  severe mitral valve prolapse bileaflet, severe mitral regurgitation, and  left atrial enlargement.   CONCLUSION:  1. Normal  coronaries.  2. Severe mitral regurgitation with mitral valve prolapse, left atrial     enlargement, and mildly reduced ejection fraction.   PLAN:  The patient is being referred to Dr. Cornelius Moras for possible repair versus  replacement of the mitral valve.                                               Rollene Rotunda, M.D.    JH/MEDQ  D:  09/16/2003  T:  09/17/2003  Job:  [id number]   cc:   Julieanne Manson  [address]  [place]  [place] [address]  Fax: [phone]

## 2011-01-14 NOTE — Cardiovascular Report (Signed)
[name], KEEL NO.:  [id number]   MEDICAL RECORD NO.:  [id number]                   PATIENT TYPE:  OIB   LOCATION:  2899                                 FACILITY:  [facility]   PHYSICIAN:  Charlton Haws, M.D.                  DATE OF BIRTH:  June 16, 1946   DATE OF PROCEDURE:  DATE OF DISCHARGE:                              CARDIAC CATHETERIZATION   PROCEDURE PERFORMED:  Transesophageal echocardiogram.   CARDIOLOGIST:  Charlton Haws, M.D.   INDICATIONS:  Mitral valve prolapse and MR.   DESCRIPTION OF PROCEDURE:  The patient was sedated with 10 mg of versed  using digital technique.  A _________ probe was advanced into the distal  esophagus without incident.  Left ventricular cavity size was relatively  normal.  There was normal ejection fraction of 55%.  The left atrium was  severely dilated.  Right-sided cardiac chambers were normal.  The mitral  valve was sever myxomatous in both leaflets.  There was an unsupported  segment in the posterior leaflet with severe bileaflet prolapse.  There was  severe mitral insufficiency with a piezovelocity of greater than 1.7 cm and  decreased systolic flow in the pulmonary veins.   A note should be made that the patient was also in fairly rapid a-fib during  the procedure.  There was no left atrial appendage thrombus.  There was no  spontaneous contrast.  The aortic valve was normal.  There was no tricuspid  valve prolapse.   IMPRESSION:  1. Normal left ventricular cavity size and function.  2. Severe left atrial enlargement with the patient in atrial fibrillation     and severe spontaneous contrast, and left atrial appendage dilatation,     but no thrombus.  3. Severe mitral insufficiency with severe bileaflet prolapse, slightly     worse in the posterior segment with severe myxomatous changes and     abnormal right-sided cardiac chambers.                                               Charlton Haws,  M.D.    PN/MEDQ  D:  09/12/2003  T:  09/13/2003  Job:  [id number]   cc:   Rollene Rotunda, M.D.

## 2011-01-14 NOTE — Discharge Summary (Signed)
NAMEOREATHA, FABRY NO.:  [id number]   MEDICAL RECORD NO.:  [id number]                   PATIENT TYPE:  INP   LOCATION:  2021                                 FACILITY:  [facility]   PHYSICIAN:  Salvatore Decent. Cornelius Moras, M.D.              DATE OF BIRTH:  Mar 29, 1946   DATE OF ADMISSION:  10/15/2003  DATE OF DISCHARGE:                                 DISCHARGE SUMMARY   DATE OF DISCHARGE:  Date of anticipated discharge October 21, 2003.   PRIMARY ADMITTING DIAGNOSIS:  Shortness of breath.   ADDITIONAL/DISCHARGE DIAGNOSES:  1. Severe mitral regurgitation.  2. Chronic atrial fibrillation.  3. Rheumatic fever as a child.  4. Mitral valve prolapse.  5. Mild asthma.  6. Allergic rhinitis and sinusitis.  7. Gastroesophageal reflux disease.  8. History of nephrolithiasis.  9. History of migraines.  10.      Degenerative joint disease.   PROCEDURES PERFORMED:  1. Mitral valve repair with #34 mm Seguin annuloplasty ring.  2. Quadrangular resection of posterior mitral valve leaflets X2 with sliding     leaflet Annuloplasty.  3. Resection of fibrotic chordae tendineae with replacement with artificial     Gore-Tex cords to anterior leaflet X2.  4. Modified Cox-Mays procedure.  5. Ligation of left atrial appendage.   HISTORY OF PRESENT ILLNESS:  The patient is a 65 year old white female with  a history of rheumatic fever as a child who has had a long standing history  of mitral regurgitation and mitral valve prolapse.  She was referred to Dr.  Rollene Rotunda in April of 2004 for evaluation of persistent atrial  fibrillation.  She was started on Coumadin and a calcium channel blocker at  that time and had regular follow up at [facility] since that time.  In December  2004 a 2-dimensional echocardiogram was performed which showed moderate to  severe mitral regurgitation with markedly enlarged left atrium and moderate  dilatation of the left ventricle but otherwise well  preserved left  ventricular systolic function.  Because of progressive chamber enlargement  and the severity of her mitral regurgitation she underwent a transesophageal  echocardiogram on September 12, 2003.  Once again this confirmed the presence  of moderate to severe mitral regurgitation with associated left atrial and  left ventricular chamber enlargement, annular dilatation of mitral valve  annulus and bileaflet prolapse with thickened anterior and posterior  leaflets of the mitral valve.  She subsequently underwent elective left and  right heart catheterization's on September 16, 2003 by Dr. Antoine Poche.  She was  not found to have any significant coronary artery disease.  Her pulmonary  artery pressures were average of 21/10 with pulmonary capillary wedge  pressure of 12.  She reported symptoms of increasing fatigue as well as  shortness of breath.  A cardiothoracic surgery consultation was obtained.  The patient saw Dr. Tressie Stalker and he felt that  because of her symptoms  and her anatomy she would best be treated with elective mitral valve repair  or replacement.  Also it was felt she would benefit from a modified Cox-Mays  procedure at the time of surgery.  The risks, benefits and alternatives to  the procedure were outlined and discussed with the patient and her family  and she agreed to proceed.  She was scheduled for outpatient admission on  October 15, 2003 to proceed with surgery.   HOSPITAL COURSE:  The patient was admitted to [facility]  on October 15, 2003.  She was taken to the operating room by Dr. Tressie Stalker and underwent mitral valve repair as described in detail above.  Also  at the time of surgery, she underwent a modified Cox-Mays procedure.  She  tolerated the procedures well and postoperative transesophageal  echocardiogram showed no residual mitral regurgitation.  She was transferred  to the SICU in stable condition.  She was hemodynamically  stable and was  able to be extubated late in the evening following surgery.  On  postoperative day #1 she required low dose dopamine and Neo-Synephrine drip  for maintenance of blood pressures in the 90 to 100 systolic range.  Over  the course of her first postoperative day she was noted to have some PAC's,  although her rhythm was generally sinus.  She was started on amiodarone and  was also atrially paced.  By postoperative day #2 all her drips had been  discontinued.  The patient was switched from intravenous to p.o. amiodarone.  She was somewhat volume overloaded and was started on Lasix.  She was  mobilized by cardiac rehabilitation phase I.  Late in the day on  postoperative day #2 she was ready for transfer to the floor.  By  postoperative day #3 she was disconnected from the external pacemaker.  From  that point onward she maintained sinus rhythm, rates in the 60's to 70's.  She has been slowly anticoagulated.  She is diuresing well and was back down  to within about 3 pounds of her preoperative weight with no significant  peripheral edema.  She has remained afebrile and all vital signs have been  stable.  She has been weaned from supplemental oxygen and has maintained  oxygen saturations of greater than 90% on room air.  Her most recent labs  show that she is somewhat hypokalemic with potassium of 3.0 and this is  presently being repleted.  Her other labs from October 20, 2003 show  hemoglobin of 10.5, hematocrit 31.1, platelet count 148,000, white blood  cell count 6.8.  BUN 8, creatinine 0.9.  Pro Time is 15.0 with INR of 1.3.  At this time her pacing wires and chest tube features have been  discontinued.  She has been tolerating a regular diet and is having normal  bowel and bladder function.  She has been ambulating in the halls both with  cardiac rehabilitation phase I as well as independently.  Her surgical incision sites are all healing well.  It is felt if she continues to  remain  stable over the next 24 hours and no significant changes occur she will be  ready for discharge home on October 21, 2003.   DISCHARGE MEDICATIONS:  1. Coumadin home dose to be determined with check of Pro Time and INR on day     of discharge.  2. Toprol XL 25 mg daily.  3. Amiodarone 200 mg b.i.d.  4.  Allegra 180 mg daily.  5. Ambien 10 mg q.h.s. PRN.  6. Advair 250/50, two puffs daily.  7. Ditropan XL one daily.  8. Xanax 0.25 mg PRN.  9. Ultram 50 to 100 mg q.4h. PRN for pain.  10.      A basic metabolic panel will be obtained on the date of discharge     to determine whether she will need to continue potassium supplement at     home.   DISCHARGE INSTRUCTIONS:  She is to refrain from driving, heavy lifting or  strenuous activity.  She may continue daily walking and use of her incentive  spirometer.  She will continue a low fat, low sodium diet.  She is asked to  shower daily and clean the incisions with soap and water.   FOLLOW UP:  The patient will see Dr. Antoine Poche in two weeks and have a chest  x-ray at that visit.  She is also to have a Pro Time and INR drawn at Dr.  Elisabeth Cara office in [place] on Thursday, October 23, 2003 for monitoring  of her Coumadin.  She will follow up with Dr. Cornelius Moras in the [facility] office on  Monday November 10, 2003 at 12:15 P.M.  She is asked to bring her chest x-ray  with her to this visit for him to review.  In the interim if the patient  experiences any problems or has a question she is asked to phone our office  immediately.      Coral Ceo, P.A.                        Salvatore Decent. Cornelius Moras, M.D.    GC/MEDQ  D:  10/20/2003  T:  10/20/2003  Job:  [id number]   cc:   Julieanne Manson  [address]  [place]  [place] [address]  Fax: [phone]

## 2011-01-14 NOTE — H&P (Signed)
NAMEHAYVEN, CROY                             ACCOUNT NO.:  [id number]   MEDICAL RECORD NO.:  [id number]                   PATIENT TYPE:  INP   LOCATION:  NA                                   FACILITY:  [facility]   PHYSICIAN:  Salvatore Decent. Cornelius Moras, M.D.              DATE OF BIRTH:  1945/10/19   DATE OF ADMISSION:  10/15/2003  DATE OF DISCHARGE:                                HISTORY & PHYSICAL   CHIEF COMPLAINT:  Severe mitral regurgitation and persistent atrial  fibrillation.   HISTORY OF PRESENT ILLNESS:  Mrs. Pettie is a 65 year old female with a  longstanding history of mitral regurgitation, mitral valve prolapse, and  rheumatic fever, who was referred by Dr. Antoine Poche to Dr. Cornelius Moras for surgical  intervention. Serial 2-D echocardiograms have shown progressive left atrium  and left ventricular chamber enlargement and increasing severity of her  underlying mitral regurgitation over the past year.  She underwent an  elective heart catheterization on September 16, 2003, which showed normal  coronary artery anatomy and pulmonary artery pressures of 21/10 with a  pulmonary capillary wedge pressure of 12.  Dr. Cornelius Moras met Mrs. Goodroe in  consultation on September 16, 2003, and dictated a full consultation note at  that time.  His impression was that the patient had severe mitral  regurgitation with progressive chamber enlargement of the left ventricle,  severe left atrial enlargement, mild progression of exertional shortness of  breath and persistent atrial fibrillation.  He felt that Mrs. Reger would  best be treated by elective mitral valve repair/replacement.  He also felt  that she could benefit from a concomitant modified Cox maze procedure.  The  risks, benefits, and alternatives to the surgery were discussed with the  patient and her husband at that time as well as during a second visit with  Dr. Cornelius Moras in clinic on September 29, 2003.  The patient understood these risks  and agreed to proceed with  surgery.   Mrs. Mangione returns today for preoperative History and Physical exam.  She  admits to worsening exertional fatigue, palpitations, and shortness of  breath with extensive exertion, all of which are unchanged from her meeting  with Dr. Cornelius Moras.  The patient denies any fever, chills, night sweats, nausea,  vomiting, diarrhea, constipation, chest pain, PND, orthopnea, cough  productive of sputum.   PAST MEDICAL HISTORY:  1. Mitral regurgitation.  2. Persistent atrial fibrillation dating back to April 2004.  3. History of rheumatic fever as a child.  4. Mitral valve prolapse.  5. Mild asthma.  6. Intermittent sinusitis and allergic rhinitis.  7. Gastroesophageal reflux disease.  8. Nephrolithiasis status post surgical removal of a large kidney stone.  9. History of migraine headache.  10.      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  11.      Status  post hysterectomy.   The patient specifically denies any history of myocardial infarction,  hypertension, diabetes, CHF, stroke, or bleeding tendencies.   CURRENT MEDICATIONS:  1. Coumadin 8 mg 4 times a week and 7 mg 3 times a week on Monday,     Wednesday, and Friday with th elast dose being October 10, 2003.  2. Cardizem CD 180 mg p.o. daily.  3. Allegra 180 mg p.o. daily.  4. Ambien 10 mg p.o. q.h.s. p.r.n. for sleep.  5. Advair  ___________ 250/50 mcg 2 puffs once daily.  6. Ditropan XL 1 tablet p.o. daily.  7. Alprazolam 0.25 mg p.o. p.r.n. for anxiety.  8. __________ 50/500 mg p.o. p.r.n. for migraines.   ALLERGIES:  The patient admits to ERYTHROMYCIN sensitivity.   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  She has been retired for approximately one year.  The patient is married with two grown children.  She denies any tobacco use  herself, although her husband is a smoker.  The patient denies any alcohol  use.    FAMILY HISTORY:  The patient's mother has a history of heart disease,  arthritis, and diabetes.  The patient specifically denies any other family  history of valve disorders, pulmonary disorders, heart disease, or bleeding  tendencies.   PERTINENT REVIEW OF SYSTEMS:  Please see HPI for pertinent positives and  negatives.  Otherwise the patient admits to dysuria, frequency,  incontinence, kidney stones, and nocturia.  These GU issues are all  currently being extensively worked up by a urologist.  The patient plans on  continuing this workup after her heart surgery.  HEENT:  The patient admits  to migraines which she has had through her entire adult life.  The patient  also admits to intermittent sinusitis and allergic rhinitis. She treats it  appropriately with over-the-counter medications.  The rest of the 12-point  Review of Systems is essentially negative.   PHYSICAL EXAMINATION:  VITAL SIGNS: Blood pressure 110/60, pulse 76 and  irregular, respirations 14 and regular labored.  The patient weighs 160  pounds and is 5 feet 1 inch tall.  GENERAL:  Well-appearing, well-nourished, pleasant, appropriate, 65 year old  female who is in no acute distress.  HEENT:  EOMI.  Conjunctivae clear.  Sclerae anicteric.  Pupils equal, round,  and reactive to light and accommodation (PERRLA).  Her hearing is  within  normal limits.  She has multiple caps and fillings and dental work.  Her  oral mucosa is moist and pink without exudate or oral sores.  NECK:  Supple without lymphadenopathy or thyromegaly.  CHEST:  Breathing is regular and unlabored.  Lungs are clear to auscultation  throughout without wheezes, crackles, or rales.  She has no CVA or spinal  tenderness.  HEART:  Regular rhythm with a 3 to 4/6 systolic murmur heard best over the  apex and radiating to the axilla.  There is no evidence of carotid bruit. Peripheral pulse: 2+ carotid pulses bilaterally, 2+ radial pulses  bilaterally, 2+  femoral pulses bilaterally, and 1+ posterior tibial pulses  bilaterally, 1+ dorsalis pedis pulses bilaterally.  She has very rare and  scattered varicose veins.  She has no scarring in her lower extremities.  She has no peripheral edema.  ABDOMEN:  Mildly obese, soft, nontender, nondistended, with good bowel  sounds throughout.  No organomegaly or masses.  SKELETAL/NEUROMUSCULAR:  The patient has equal and appropriate strength  throughout.  She is neurologically grossly intact  with no focal deficits.   IMPRESSION AND PLAN:  Mrs. Inzunza is a 65 year old female with severe mitral  regurgitation, atrial fibrillation, progressive chamber enlargement, and  mild progressive shortness of breath.  She would greatly benefit from an  elective mitral valve repair/replacement at this point in time.  We feel  that she would also benefit from a Cox maze procedure for attempt to  eradicate patient's persistent atrial fibrillation.  We will admit Mrs.  Denham to  [facility] on October 15, 2003, for elective mitral valve repair  versus replacement and concomitant maze procedure, by Dr. Cornelius Moras.  The patient  had followup PT/INR drawn today for which results are pending at the time of  this dictation.  We will follow these results and manage them appropriately  prior to surgery.      Carolyn A. Eustaquio Boyden.                  Salvatore Decent. Cornelius Moras, M.D.    CAF/MEDQ  D:  10/13/2003  T:  10/13/2003  Job:  [id number]   cc:   Rollene Rotunda, M.D.   Julieanne Manson, M.D.

## 2011-01-14 NOTE — Op Note (Signed)
NAME:  [name], [name] NO.:  [id number]   MEDICAL RECORD NO.:  [id number]                   PATIENT TYPE:  OIB   LOCATION:  2899                                 FACILITY:  [facility]   PHYSICIAN:  Doylene Canning. Ladona Ridgel, M.D.               DATE OF BIRTH:  Feb 28, 1946   DATE OF PROCEDURE:  03/12/2004  DATE OF DISCHARGE:                                 OPERATIVE REPORT   PROCEDURE PERFORMED:  DC cardioversion.   INDICATIONS FOR PROCEDURE:  Incessant atrial flutter status post epicardial  Maze procedure in conjunction with mitral valve repair.   I:  INTRODUCTION:  The patient is a 65 year old woman with a history of  mitral valve disease status post mitral valve repair by Dr. Cornelius Moras with  concomitant Cox-Maze procedure.  This was done for persistent atrial  fibrillation.  The patient tolerated the procedure well and did well  postoperatively but developed atrial flutter which appeared to be of left  atrial origin.  She is now referred for DC cardioversion.  Of note, the  patient has been therapeutically anticoagulated for four weeks.   II:  PROCEDURE:  After informed consent was obtained, the patient was  prepped in the usual manner.  The electric dispersive pads were applied in  the anterior and posterior position.  Dr. Sampson Goon utilized 150 mg sodium  Pentothal for anesthesia.  The patient was successfully cardioverted back to  sinus rhythm with 150 joules of synchronized biphasic energy.  There were no  immediate future complications.  The patient tolerated the procedure well.   III:  COMPLICATIONS:  There were no immediate procedure complications.   IV:  RESULTS:  Successful DC cardioversion in a patient with incessant atrial flutter  restoring sinus rhythm.                                               Doylene Canning. Ladona Ridgel, M.D.    GWT/MEDQ  D:  03/12/2004  T:  03/12/2004  Job:  [id number]   cc:   Rollene Rotunda, M.D.   Salvatore Decent. Cornelius Moras, M.D.  [address]  [place]  [place] [address]   Julieanne Manson  [address]  [place]  [place] [address]  Fax: [phone]

## 2011-01-20 ENCOUNTER — Ambulatory Visit (INDEPENDENT_AMBULATORY_CARE_PROVIDER_SITE_OTHER): Payer: Medicare Other | Admitting: Family Medicine

## 2011-01-20 DIAGNOSIS — Z7901 Long term (current) use of anticoagulants: Secondary | ICD-10-CM

## 2011-01-20 DIAGNOSIS — Z5181 Encounter for therapeutic drug level monitoring: Secondary | ICD-10-CM

## 2011-01-20 DIAGNOSIS — I4891 Unspecified atrial fibrillation: Secondary | ICD-10-CM

## 2011-01-25 ENCOUNTER — Ambulatory Visit (INDEPENDENT_AMBULATORY_CARE_PROVIDER_SITE_OTHER): Payer: No Typology Code available for payment source | Admitting: Psychology

## 2011-01-25 DIAGNOSIS — F331 Major depressive disorder, recurrent, moderate: Secondary | ICD-10-CM

## 2011-02-01 ENCOUNTER — Other Ambulatory Visit: Payer: Self-pay | Admitting: *Deleted

## 2011-02-01 NOTE — Telephone Encounter (Signed)
Ok to refill? Last refill 01/11/2011

## 2011-02-01 NOTE — Telephone Encounter (Signed)
i have not seen her in some time.  Can you have her come in for eval - 30 min somewhere nonurgently

## 2011-02-02 NOTE — Telephone Encounter (Signed)
Patient advised as instructed via telephone.  She will call back when she gets home to look at her calendar to schedule appt.

## 2011-02-03 ENCOUNTER — Other Ambulatory Visit: Payer: Self-pay | Admitting: *Deleted

## 2011-02-03 MED ORDER — PROMETHAZINE HCL 25 MG PO TABS: 25.0000 mg | ORAL_TABLET | Freq: Four times a day (QID) | ORAL | Status: DC | PRN

## 2011-02-03 NOTE — Telephone Encounter (Signed)
Pt is asking for a refill on phenergan, this was denied on 6/5 because she needed an office visit.  She has one scheduled for the end of this month and is asking if she can have enough to last until then.  [facility].

## 2011-02-03 NOTE — Telephone Encounter (Signed)
See note below, pt [facility], not [facility].

## 2011-02-16 ENCOUNTER — Ambulatory Visit: Payer: Medicare Other

## 2011-02-17 ENCOUNTER — Ambulatory Visit (INDEPENDENT_AMBULATORY_CARE_PROVIDER_SITE_OTHER): Payer: Medicare Other | Admitting: Family Medicine

## 2011-02-17 ENCOUNTER — Encounter: Payer: Self-pay | Admitting: Family Medicine

## 2011-02-17 DIAGNOSIS — F3289 Other specified depressive episodes: Secondary | ICD-10-CM

## 2011-02-17 DIAGNOSIS — R5383 Other fatigue: Secondary | ICD-10-CM

## 2011-02-17 DIAGNOSIS — R5381 Other malaise: Secondary | ICD-10-CM

## 2011-02-17 DIAGNOSIS — Z5181 Encounter for therapeutic drug level monitoring: Secondary | ICD-10-CM

## 2011-02-17 DIAGNOSIS — F329 Major depressive disorder, single episode, unspecified: Secondary | ICD-10-CM

## 2011-02-17 DIAGNOSIS — F411 Generalized anxiety disorder: Secondary | ICD-10-CM

## 2011-02-17 DIAGNOSIS — IMO0002 Reserved for concepts with insufficient information to code with codable children: Secondary | ICD-10-CM

## 2011-02-17 DIAGNOSIS — Z79899 Other long term (current) drug therapy: Secondary | ICD-10-CM

## 2011-02-17 DIAGNOSIS — K219 Gastro-esophageal reflux disease without esophagitis: Secondary | ICD-10-CM

## 2011-02-17 DIAGNOSIS — Z7901 Long term (current) use of anticoagulants: Secondary | ICD-10-CM

## 2011-02-17 DIAGNOSIS — I4891 Unspecified atrial fibrillation: Secondary | ICD-10-CM

## 2011-02-17 MED ORDER — HYDROXYZINE PAMOATE 25 MG PO CAPS: 25.0000 mg | ORAL_CAPSULE | Freq: Four times a day (QID) | ORAL | Status: DC | PRN

## 2011-02-17 MED ORDER — CLONAZEPAM 1 MG PO TABS: 1.0000 mg | ORAL_TABLET | Freq: Two times a day (BID) | ORAL | Status: DC | PRN

## 2011-02-17 MED ORDER — MIRTAZAPINE 45 MG PO TABS: 45.0000 mg | ORAL_TABLET | Freq: Every day | ORAL | Status: DC

## 2011-02-17 MED ORDER — PROMETHAZINE HCL 25 MG PO TABS: 25.0000 mg | ORAL_TABLET | Freq: Four times a day (QID) | ORAL | Status: DC | PRN

## 2011-02-17 MED ORDER — PANTOPRAZOLE SODIUM 40 MG PO TBEC: 40.0000 mg | DELAYED_RELEASE_TABLET | Freq: Every day | ORAL | Status: DC

## 2011-02-17 NOTE — Patient Instructions (Signed)
Recheck 6 months
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
## 2011-02-17 NOTE — Patient Instructions (Signed)
Continue current dose, check in 4 weeks  

## 2011-02-22 ENCOUNTER — Ambulatory Visit (INDEPENDENT_AMBULATORY_CARE_PROVIDER_SITE_OTHER): Payer: Medicare Other | Admitting: Psychology

## 2011-02-22 DIAGNOSIS — F331 Major depressive disorder, recurrent, moderate: Secondary | ICD-10-CM

## 2011-03-03 ENCOUNTER — Ambulatory Visit: Payer: No Typology Code available for payment source | Admitting: Cardiology

## 2011-03-17 ENCOUNTER — Ambulatory Visit (INDEPENDENT_AMBULATORY_CARE_PROVIDER_SITE_OTHER): Payer: Medicare Other | Admitting: Family Medicine

## 2011-03-17 DIAGNOSIS — I4891 Unspecified atrial fibrillation: Secondary | ICD-10-CM

## 2011-03-17 DIAGNOSIS — Z5181 Encounter for therapeutic drug level monitoring: Secondary | ICD-10-CM

## 2011-03-17 DIAGNOSIS — Z7901 Long term (current) use of anticoagulants: Secondary | ICD-10-CM

## 2011-03-17 LAB — POCT INR: INR: 2.6

## 2011-03-17 NOTE — Patient Instructions (Signed)
Continue 8 mg daily, recheck 4 weeks 

## 2011-03-22 ENCOUNTER — Ambulatory Visit: Payer: No Typology Code available for payment source | Admitting: Psychology

## 2011-03-22 ENCOUNTER — Encounter: Payer: Self-pay | Admitting: Cardiology

## 2011-04-13 ENCOUNTER — Ambulatory Visit (INDEPENDENT_AMBULATORY_CARE_PROVIDER_SITE_OTHER): Payer: Medicare Other | Admitting: Family Medicine

## 2011-04-13 ENCOUNTER — Encounter: Payer: Self-pay | Admitting: Family Medicine

## 2011-04-13 VITALS — BP 100/60 | HR 87 | Temp 97.5°F | Ht 69.0 in | Wt 150.0 lb

## 2011-04-13 DIAGNOSIS — Z5181 Encounter for therapeutic drug level monitoring: Secondary | ICD-10-CM

## 2011-04-13 DIAGNOSIS — Z7901 Long term (current) use of anticoagulants: Secondary | ICD-10-CM

## 2011-04-13 DIAGNOSIS — R300 Dysuria: Secondary | ICD-10-CM

## 2011-04-13 DIAGNOSIS — I4891 Unspecified atrial fibrillation: Secondary | ICD-10-CM

## 2011-04-13 LAB — POCT URINALYSIS DIPSTICK
Glucose, UA: NEGATIVE
Nitrite, UA: NEGATIVE
Protein, UA: NEGATIVE
Spec Grav, UA: 1.02
Urobilinogen, UA: NEGATIVE

## 2011-04-13 NOTE — Patient Instructions (Signed)
Continue current dose, check in 4 weeks  

## 2011-04-13 NOTE — Progress Notes (Signed)
  Subjective:    Patient ID: [name], female    DOB: 05/14/1946, 65 y.o.   MRN: [id number]  HPI  [name], a 65 y.o. female presents today in the office for the following:    About 4 weeks ago - switched to generic lansoprazole. Stomach, nausea. Back on brand, and doing a lot better. About two weeks ago, felt like she was having some pain in her abdomen. And taking some uribel. Also taking some AZO tablets.   Pain in lower abdomen.  Pain in her back, too.  History of some chronic bladder spasms in the past also   The PMH, PSH, Social History, Family History, Medications, and allergies have been reviewed in [facility], and have been updated if relevant.  Review of Systems ROS: GEN: No acute illnesses, no fevers, chills. GI: above Pulm: No SOB Psych: some social stress with 2 older family members not doing well Interactive and getting along well at home.  Otherwise, ROS is as per the HPI.     Objective:   Physical Exam   Physical Exam  Blood pressure 100/60, pulse 87, temperature 97.5 F (36.4 C), temperature source Oral, height 5\' 9"  (1.753 m), weight 150 lb (68.04 kg), SpO2 98.00%.  GEN: WDWN, NAD, Non-toxic, A & O x 3 HEENT: Atraumatic, Normocephalic. Neck supple. No masses, No LAD. Ears and Nose: No external deformity. ABD: S, NT, ND, +BS. No rebound tenderness. No HSM.  EXTR: No c/c/e NEURO Normal gait.  PSYCH: Normally interactive. Conversant. Not depressed or anxious appearing.  Calm demeanor.        Assessment & Plan:   1. Dysuria  POCT Urinalysis Dipstick, Urine culture   UA neg, but on AZO With her history, will check cx before treatment Confounder is her recent gi upset

## 2011-04-14 ENCOUNTER — Ambulatory Visit: Payer: No Typology Code available for payment source

## 2011-04-15 ENCOUNTER — Telehealth: Payer: Self-pay | Admitting: Family Medicine

## 2011-04-15 MED ORDER — CIPROFLOXACIN HCL 250 MG PO TABS: 250.0000 mg | ORAL_TABLET | Freq: Two times a day (BID) | ORAL | Status: AC

## 2011-04-15 NOTE — Telephone Encounter (Signed)
Notify pt that preliminary urine culture report is positive....have her start cipro. We will call once sensitivities back. Also find out when next INR is.

## 2011-04-15 NOTE — Telephone Encounter (Signed)
Let pt know that she will need INR chevcked early next week given placed on Cipro.. This can change blood thickness on coumadin.

## 2011-04-15 NOTE — Telephone Encounter (Signed)
Patient has INR 1 month from  From yesterday

## 2011-04-16 LAB — URINE CULTURE: Colony Count: >=100000

## 2011-04-18 ENCOUNTER — Telehealth: Payer: Self-pay | Admitting: *Deleted

## 2011-04-18 MED ORDER — SULFAMETHOXAZOLE-TRIMETHOPRIM 800-160 MG PO TABS: 1.0000 | ORAL_TABLET | Freq: Two times a day (BID) | ORAL | Status: AC

## 2011-04-18 NOTE — Telephone Encounter (Signed)
She needs to have INR checked 2-3 days after starting this new antibitoics. Sent in sulfa to pharm.

## 2011-04-18 NOTE — Telephone Encounter (Signed)
Left message on machine for patient to call.

## 2011-04-18 NOTE — Telephone Encounter (Signed)
Pt is aware.  

## 2011-04-18 NOTE — Telephone Encounter (Signed)
See new phone note regarding this subject- pt didn't take the cipro.

## 2011-04-18 NOTE — Telephone Encounter (Signed)
Pt took one cipro on Saturday for a UTI.  She states this caused nausea, sweating,headache, palpitations.  She only took one, says it was too strong.  She wants something else called to [facility].  She says she still has the infection.

## 2011-04-18 NOTE — Telephone Encounter (Signed)
Also, since she didn't take the cipro, is it ok for her to wait until her next INR appt mid September?

## 2011-04-19 ENCOUNTER — Other Ambulatory Visit: Payer: Self-pay | Admitting: *Deleted

## 2011-04-19 MED ORDER — LANSOPRAZOLE 30 MG PO CPDR: 30.0000 mg | DELAYED_RELEASE_CAPSULE | Freq: Every day | ORAL | Status: DC

## 2011-04-21 ENCOUNTER — Ambulatory Visit: Payer: Self-pay | Admitting: Cardiology

## 2011-04-22 ENCOUNTER — Ambulatory Visit (INDEPENDENT_AMBULATORY_CARE_PROVIDER_SITE_OTHER): Payer: PRIVATE HEALTH INSURANCE | Admitting: Family Medicine

## 2011-04-22 DIAGNOSIS — Z7901 Long term (current) use of anticoagulants: Secondary | ICD-10-CM

## 2011-04-22 DIAGNOSIS — Z5181 Encounter for therapeutic drug level monitoring: Secondary | ICD-10-CM

## 2011-04-22 DIAGNOSIS — I4891 Unspecified atrial fibrillation: Secondary | ICD-10-CM

## 2011-04-22 LAB — POCT INR: INR: 3.9

## 2011-04-22 NOTE — Patient Instructions (Signed)
Hold today's dose then start alternating  4mg /8mg  until seen, recheck Wed

## 2011-04-25 ENCOUNTER — Other Ambulatory Visit: Payer: Self-pay | Admitting: *Deleted

## 2011-04-25 ENCOUNTER — Telehealth: Payer: Self-pay | Admitting: *Deleted

## 2011-04-25 MED ORDER — CLONAZEPAM 1 MG PO TABS: 1.0000 mg | ORAL_TABLET | Freq: Two times a day (BID) | ORAL | Status: DC | PRN

## 2011-04-25 NOTE — Telephone Encounter (Signed)
Patient called and stated that she has been on Septra. Patient states that she was not able to take the medication yesterday or today because it made her so sick to her stomach. Patient wants to know if she should start back on her Coumadin 8mg  today since she was not able to take the antibiotic the last 2 days?

## 2011-04-25 NOTE — Telephone Encounter (Signed)
No.  Ok to d/c septra -- should have been enough time.  Alternate 8 mg one day, then 4 mg the next day with Coumadin  Recheck on wed. INR

## 2011-04-25 NOTE — Telephone Encounter (Signed)
Ok to refill #60, 3 refills 

## 2011-04-25 NOTE — Telephone Encounter (Signed)
rx called to pharmacy 

## 2011-04-25 NOTE — Telephone Encounter (Signed)
Patient advised.

## 2011-04-27 ENCOUNTER — Ambulatory Visit (INDEPENDENT_AMBULATORY_CARE_PROVIDER_SITE_OTHER): Payer: Medicare Other | Admitting: Family Medicine

## 2011-04-27 DIAGNOSIS — Z7901 Long term (current) use of anticoagulants: Secondary | ICD-10-CM

## 2011-04-27 DIAGNOSIS — Z5181 Encounter for therapeutic drug level monitoring: Secondary | ICD-10-CM

## 2011-04-27 DIAGNOSIS — I4891 Unspecified atrial fibrillation: Secondary | ICD-10-CM

## 2011-04-27 NOTE — Patient Instructions (Signed)
Restart 8 mg daily, recheck 2 weeks

## 2011-04-29 ENCOUNTER — Other Ambulatory Visit: Payer: Self-pay | Admitting: Family Medicine

## 2011-04-29 DIAGNOSIS — E039 Hypothyroidism, unspecified: Secondary | ICD-10-CM

## 2011-04-29 DIAGNOSIS — I4891 Unspecified atrial fibrillation: Secondary | ICD-10-CM

## 2011-04-29 DIAGNOSIS — E785 Hyperlipidemia, unspecified: Secondary | ICD-10-CM

## 2011-05-04 ENCOUNTER — Other Ambulatory Visit (INDEPENDENT_AMBULATORY_CARE_PROVIDER_SITE_OTHER): Payer: Medicare Other

## 2011-05-04 DIAGNOSIS — E785 Hyperlipidemia, unspecified: Secondary | ICD-10-CM

## 2011-05-04 DIAGNOSIS — I4891 Unspecified atrial fibrillation: Secondary | ICD-10-CM

## 2011-05-04 DIAGNOSIS — E039 Hypothyroidism, unspecified: Secondary | ICD-10-CM

## 2011-05-04 LAB — COMPREHENSIVE METABOLIC PANEL
ALT: 17 U/L (ref 0–35)
AST: 18 U/L (ref 0–37)
Albumin: 4.1 g/dL (ref 3.5–5.2)
Alkaline Phosphatase: 44 U/L (ref 39–117)
Calcium: 8.9 mg/dL (ref 8.4–10.5)
Chloride: 108 mEq/L (ref 96–112)
Potassium: 4.4 mEq/L (ref 3.5–5.1)
Sodium: 143 mEq/L (ref 135–145)
Total Protein: 6.5 g/dL (ref 6.0–8.3)

## 2011-05-04 LAB — CBC WITH DIFFERENTIAL/PLATELET
Basophils Absolute: 0 10*3/uL (ref 0.0–0.1)
HCT: 41.4 % (ref 36.0–46.0)
Hemoglobin: 13.7 g/dL (ref 12.0–15.0)
Lymphs Abs: 1.4 10*3/uL (ref 0.7–4.0)
MCV: 98.5 fl (ref 78.0–100.0)
Monocytes Absolute: 0.3 10*3/uL (ref 0.1–1.0)
Monocytes Relative: 5.6 % (ref 3.0–12.0)
Neutro Abs: 2.9 10*3/uL (ref 1.4–7.7)
Platelets: 208 10*3/uL (ref 150.0–400.0)
RDW: 13.6 % (ref 11.5–14.6)

## 2011-05-04 LAB — LIPID PANEL
Cholesterol: 190 mg/dL (ref 0–200)
LDL Cholesterol: 107 mg/dL — ABNORMAL HIGH (ref 0–99)
Total CHOL/HDL Ratio: 4
VLDL: 36.4 mg/dL (ref 0.0–40.0)

## 2011-05-04 LAB — T4, FREE: Free T4: 1.07 ng/dL (ref 0.60–1.60)

## 2011-05-11 ENCOUNTER — Encounter: Payer: Self-pay | Admitting: Family Medicine

## 2011-05-11 ENCOUNTER — Ambulatory Visit (INDEPENDENT_AMBULATORY_CARE_PROVIDER_SITE_OTHER): Payer: Medicare Other | Admitting: Family Medicine

## 2011-05-11 ENCOUNTER — Ambulatory Visit (INDEPENDENT_AMBULATORY_CARE_PROVIDER_SITE_OTHER): Payer: No Typology Code available for payment source | Admitting: Family Medicine

## 2011-05-11 VITALS — BP 120/78 | HR 84 | Temp 98.3°F | Ht 69.0 in | Wt 150.1 lb

## 2011-05-11 DIAGNOSIS — F411 Generalized anxiety disorder: Secondary | ICD-10-CM

## 2011-05-11 DIAGNOSIS — Z0110 Encounter for examination of ears and hearing without abnormal findings: Secondary | ICD-10-CM

## 2011-05-11 DIAGNOSIS — E785 Hyperlipidemia, unspecified: Secondary | ICD-10-CM

## 2011-05-11 DIAGNOSIS — E039 Hypothyroidism, unspecified: Secondary | ICD-10-CM

## 2011-05-11 DIAGNOSIS — Z0100 Encounter for examination of eyes and vision without abnormal findings: Secondary | ICD-10-CM

## 2011-05-11 DIAGNOSIS — Z7901 Long term (current) use of anticoagulants: Secondary | ICD-10-CM

## 2011-05-11 DIAGNOSIS — Z0000 Encounter for general adult medical examination without abnormal findings: Secondary | ICD-10-CM

## 2011-05-11 DIAGNOSIS — F329 Major depressive disorder, single episode, unspecified: Secondary | ICD-10-CM

## 2011-05-11 DIAGNOSIS — Z23 Encounter for immunization: Secondary | ICD-10-CM

## 2011-05-11 DIAGNOSIS — I4891 Unspecified atrial fibrillation: Secondary | ICD-10-CM

## 2011-05-11 DIAGNOSIS — Z5181 Encounter for therapeutic drug level monitoring: Secondary | ICD-10-CM

## 2011-05-11 LAB — POCT INR: INR: 2.6

## 2011-05-11 MED ORDER — BUTALBITAL-APAP-CAFF-COD 50-325-40-30 MG PO CAPS: 1.0000 | ORAL_CAPSULE | ORAL | Status: DC | PRN

## 2011-05-11 NOTE — Patient Instructions (Signed)
Call [facility] about when to have a repeat colonoscopy Set up mammogram

## 2011-05-11 NOTE — Progress Notes (Signed)
Subjective:    Patient ID: [name], female    DOB: 02/20/1946, 65 y.o.   MRN: [id number]  HPI  [name], a 65 y.o. female presents today in the office for the following:    Medicare wellness exam.  Colonoscopy Mammo --gets mammo at [facility], will check with [facility] about colon  Hochrein, seeing tomorrow.   Health Maintenance Summary Reviewed and updated, unless pt declines services.  Tobacco History Reviewed. Non-smoker Alcohol: No concerns, no excessive use Exercise Habits: Some activity, rec at least 30 mins 5 times a week --- walking 3-4 days a week STD concerns: none Drug Use: None Birth control method: s/p hyst Menses regular: n/a Lumps or breast concerns: no Breast Cancer Family History: no  Labs reviewed with the patient.   Lipids:    Component Value Date/Time   CHOL 190 05/04/2011 1026   TRIG 182.0* 05/04/2011 1026   HDL 47.00 05/04/2011 1026   LDLDIRECT 132.3 03/31/2010 0906   VLDL 36.4 05/04/2011 1026   CHOLHDL 4 05/04/2011 1026    CBC:    Component Value Date/Time   WBC 4.7 05/04/2011 1026   HGB 13.7 05/04/2011 1026   HCT 41.4 05/04/2011 1026   PLT 208.0 05/04/2011 1026   MCV 98.5 05/04/2011 1026   NEUTROABS 2.9 05/04/2011 1026   LYMPHSABS 1.4 05/04/2011 1026   MONOABS 0.3 05/04/2011 1026   EOSABS 0.0 05/04/2011 1026   BASOSABS 0.0 05/04/2011 1026    Basic Metabolic Panel:    Component Value Date/Time   NA 143 05/04/2011 1026   K 4.4 05/04/2011 1026   CL 108 05/04/2011 1026   CO2 26 05/04/2011 1026   BUN 8 05/04/2011 1026   CREATININE 0.9 05/04/2011 1026   GLUCOSE 98 05/04/2011 1026   CALCIUM 8.9 05/04/2011 1026    Lab Results  Component Value Date   ALT 17 05/04/2011   AST 18 05/04/2011   ALKPHOS 44 05/04/2011   BILITOT 0.7 05/04/2011   Lipids: Doing well, stable. Tolerating meds fine with no SE. Panel reviewed with patient.  Lipids:    Component Value Date/Time   CHOL 190 05/04/2011 1026   TRIG 182.0* 05/04/2011 1026   HDL 47.00 05/04/2011 1026   LDLDIRECT 132.3 03/31/2010  0906   VLDL 36.4 05/04/2011 1026   CHOLHDL 4 05/04/2011 1026    Lab Results  Component Value Date   ALT 17 05/04/2011   AST 18 05/04/2011   ALKPHOS 44 05/04/2011   BILITOT 0.7 05/04/2011   Thyroid: No symptoms. Labs reviewed. Denies cold / heat intolerance, dry skin, hair loss. No goiter.  Lab Results  Component Value Date   TSH 0.98 05/04/2011   T4 normal  Anxiety and depression, doing much better. On Remeron, Wellbutrin, bid Klonopin, and Vistaril, but much more stable that 1-2 years ago. Getting out and enjoying things.   Review of Systems  General: Denies fever, chills, sweats. No significant weight loss. Eyes: Denies blurring,significant itching ENT: Denies earache, sore throat, and hoarseness.  Cardiovascular: Denies chest pains, palpitations, dyspnea on exertion,  Respiratory: Denies cough, dyspnea at rest,wheeezing Breast: no concerns about lumps GI: Denies nausea, vomiting, diarrhea, constipation, change in bowel habits, abdominal pain, melena, hematochezia GU: Denies dysuria, hematuria, urinary hesitancy, nocturia, denies STD risk, no concerns about discharge Musculoskeletal: Denies back pain, joint pain Derm: Denies rash, itching Neuro: Denies  paresthesias, frequent falls, frequent headaches Psych: Denies depression, anxiety Endocrine: Denies cold intolerance, heat intolerance, polydipsia Heme: Denies enlarged lymph nodes Allergy:  No hayfever     Objective:   Physical Exam   Physical Exam  Blood pressure 120/78, pulse 84, temperature 98.3 F (36.8 C), temperature source Oral, height 5\' 9"  (1.753 m), weight 150 lb 1.9 oz (68.094 kg), SpO2 97.00%.  GEN: well developed, well nourished, no acute distress Eyes: conjunctiva and lids normal, PERRLA, EOMI ENT: TM clear, nares clear, oral exam WNL Neck: supple, no lymphadenopathy, no thyromegaly, no JVD Pulm: clear to auscultation and percussion, respiratory effort normal CV: regular rate and rhythm today Chest: no scars,  masses, no gynecomastia   BREAST: no lumps, no axillary LAD, no nipple discharge GI: soft, non-tender; no hepatosplenomegaly, masses; active bowel sounds all quadrants Lymph: no cervical, axillary or inguinal adenopathy MSK: gait normal, muscle tone and strength WNL, no joint swelling, effusions, discoloration, crepitus  SKIN: clear, good turgor, color WNL, no rashes, lesions, or ulcerations Neuro: normal mental status, normal strength, sensation, and motion Psych: alert; oriented to person, place and time, normally interactive and not anxious or depressed in appearance.       Assessment & Plan:   1. Routine general medical examination at a health care facility    2. Flu vaccine need  Flu vaccine greater than or equal to 3yo preservative free IM  3. HYPERLIPIDEMIA    4. HYPOTHYROIDISM    5. DEPRESSION    6. ANXIETY     I have personally reviewed the Medicare Annual Wellness questionnaire and have noted 1. The patient's medical and social history 2. Their use of alcohol, tobacco or illicit drugs 3. Their current medications and supplements 4. The patient's functional ability including ADL's, fall risks, home safety risks and hearing or visual             impairment. 5. Diet and physical activities 6. Evidence for depression or mood disorders  The patients weight, height, BMI and visual acuity have been recorded in the chart I have made referrals, counseling and provided education to the patient based review of the above and I have provided the pt with a written personalized care plan for preventive services.  I have provided the patient with a copy of your personalized plan for preventive services. Instructed to take the time to review along with their updated medication list.   Lipids, thyroid, depression, and anxiety are all stable, and will keep medications the same.

## 2011-05-12 ENCOUNTER — Ambulatory Visit (INDEPENDENT_AMBULATORY_CARE_PROVIDER_SITE_OTHER): Payer: Medicare Other | Admitting: Cardiology

## 2011-05-12 ENCOUNTER — Encounter: Payer: Self-pay | Admitting: Cardiology

## 2011-05-12 DIAGNOSIS — E785 Hyperlipidemia, unspecified: Secondary | ICD-10-CM

## 2011-05-12 DIAGNOSIS — F329 Major depressive disorder, single episode, unspecified: Secondary | ICD-10-CM

## 2011-05-12 DIAGNOSIS — I4891 Unspecified atrial fibrillation: Secondary | ICD-10-CM

## 2011-05-12 DIAGNOSIS — I080 Rheumatic disorders of both mitral and aortic valves: Secondary | ICD-10-CM

## 2011-05-12 NOTE — Assessment & Plan Note (Signed)
It has been over 3 years since her echocardiogram to followup her mitral valve repair.  I will schedule a follow up echo.
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
## 2011-05-12 NOTE — Assessment & Plan Note (Signed)
She has had no symptomatic paroxysm of this.  No change in therapy is indicated.

## 2011-05-12 NOTE — Assessment & Plan Note (Signed)
This still seems to be a limiting problem although this is being actively managed.

## 2011-05-12 NOTE — Patient Instructions (Signed)
Your physician has requested that you have an echocardiogram. Echocardiography is a painless test that uses sound waves to create images of your heart. It provides your doctor with information about the size and shape of your heart and how well your heart's chambers and valves are working. This procedure takes approximately one hour. There are no restrictions for this procedure.  The current medical regimen is effective;  continue present plan and medications.  Follow up in 1 year with Dr Hochrein.  You will receive a letter in the mail 2 months before you are due.  Please call us when you receive this letter to schedule your follow up appointment.  

## 2011-05-16 ENCOUNTER — Other Ambulatory Visit: Payer: Self-pay | Admitting: *Deleted

## 2011-05-16 MED ORDER — LEVOTHYROXINE SODIUM 50 MCG PO TABS: 50.0000 ug | ORAL_TABLET | Freq: Every day | ORAL | Status: DC

## 2011-05-20 ENCOUNTER — Ambulatory Visit (HOSPITAL_COMMUNITY): Payer: Medicare Other | Attending: Cardiology | Admitting: Radiology

## 2011-05-20 DIAGNOSIS — I059 Rheumatic mitral valve disease, unspecified: Secondary | ICD-10-CM | POA: Insufficient documentation

## 2011-05-20 DIAGNOSIS — E785 Hyperlipidemia, unspecified: Secondary | ICD-10-CM | POA: Insufficient documentation

## 2011-05-20 DIAGNOSIS — I080 Rheumatic disorders of both mitral and aortic valves: Secondary | ICD-10-CM

## 2011-05-20 DIAGNOSIS — I079 Rheumatic tricuspid valve disease, unspecified: Secondary | ICD-10-CM | POA: Insufficient documentation

## 2011-05-20 DIAGNOSIS — I4891 Unspecified atrial fibrillation: Secondary | ICD-10-CM | POA: Insufficient documentation

## 2011-06-09 ENCOUNTER — Other Ambulatory Visit: Payer: Self-pay

## 2011-06-09 MED ORDER — WARFARIN SODIUM 3 MG PO TABS: 3.0000 mg | ORAL_TABLET | ORAL | Status: DC

## 2011-06-09 NOTE — Telephone Encounter (Signed)
[facility] pharmacy faxed refill request for Warfarin sodium 3 mg #30 x 3 refills.

## 2011-06-13 ENCOUNTER — Ambulatory Visit: Payer: Medicare Other

## 2011-06-14 ENCOUNTER — Ambulatory Visit (INDEPENDENT_AMBULATORY_CARE_PROVIDER_SITE_OTHER): Payer: Medicare Other | Admitting: Family Medicine

## 2011-06-14 DIAGNOSIS — I4891 Unspecified atrial fibrillation: Secondary | ICD-10-CM

## 2011-06-14 DIAGNOSIS — Z5181 Encounter for therapeutic drug level monitoring: Secondary | ICD-10-CM

## 2011-06-14 DIAGNOSIS — Z7901 Long term (current) use of anticoagulants: Secondary | ICD-10-CM

## 2011-06-14 LAB — POCT INR: INR: 1.9

## 2011-06-14 NOTE — Patient Instructions (Signed)
Continue 8 mg daily, recheck 4 weeks 

## 2011-07-12 ENCOUNTER — Ambulatory Visit (INDEPENDENT_AMBULATORY_CARE_PROVIDER_SITE_OTHER): Payer: Medicare Other | Admitting: Family Medicine

## 2011-07-12 DIAGNOSIS — Z5181 Encounter for therapeutic drug level monitoring: Secondary | ICD-10-CM

## 2011-07-12 DIAGNOSIS — Z7901 Long term (current) use of anticoagulants: Secondary | ICD-10-CM

## 2011-07-12 DIAGNOSIS — I4891 Unspecified atrial fibrillation: Secondary | ICD-10-CM

## 2011-07-12 LAB — POCT INR: INR: 2.4

## 2011-07-12 NOTE — Patient Instructions (Signed)
Continue 8 mg daily, recheck 4 weeks

## 2011-07-13 ENCOUNTER — Ambulatory Visit: Payer: Self-pay | Admitting: Family Medicine

## 2011-07-13 ENCOUNTER — Encounter: Payer: Self-pay | Admitting: Family Medicine

## 2011-07-13 ENCOUNTER — Encounter: Payer: Self-pay | Admitting: *Deleted

## 2011-08-01 ENCOUNTER — Other Ambulatory Visit: Payer: Self-pay | Admitting: Internal Medicine

## 2011-08-01 MED ORDER — MIRTAZAPINE 45 MG PO TABS: 45.0000 mg | ORAL_TABLET | Freq: Every day | ORAL | Status: DC

## 2011-08-09 ENCOUNTER — Ambulatory Visit (INDEPENDENT_AMBULATORY_CARE_PROVIDER_SITE_OTHER): Payer: Medicare Other | Admitting: Family Medicine

## 2011-08-09 DIAGNOSIS — I4891 Unspecified atrial fibrillation: Secondary | ICD-10-CM

## 2011-08-09 DIAGNOSIS — Z5181 Encounter for therapeutic drug level monitoring: Secondary | ICD-10-CM

## 2011-08-09 DIAGNOSIS — Z7901 Long term (current) use of anticoagulants: Secondary | ICD-10-CM

## 2011-08-09 NOTE — Patient Instructions (Signed)
Continue 8 mg daily, recheck 4 weeks 

## 2011-08-26 ENCOUNTER — Other Ambulatory Visit: Payer: Self-pay | Admitting: Cardiology

## 2011-08-26 MED ORDER — METOPROLOL SUCCINATE ER 25 MG PO TB24: 25.0000 mg | ORAL_TABLET | Freq: Every day | ORAL | Status: DC

## 2011-09-06 ENCOUNTER — Ambulatory Visit (INDEPENDENT_AMBULATORY_CARE_PROVIDER_SITE_OTHER): Payer: Medicare Other | Admitting: Family Medicine

## 2011-09-06 DIAGNOSIS — Z7901 Long term (current) use of anticoagulants: Secondary | ICD-10-CM

## 2011-09-06 DIAGNOSIS — Z5181 Encounter for therapeutic drug level monitoring: Secondary | ICD-10-CM

## 2011-09-06 DIAGNOSIS — I4891 Unspecified atrial fibrillation: Secondary | ICD-10-CM

## 2011-09-06 LAB — POCT INR: INR: 1.6

## 2011-09-06 NOTE — Patient Instructions (Signed)
Increase 8 mg daily, 10 mg tues, Fri, recheck 4 weeks

## 2011-09-07 ENCOUNTER — Other Ambulatory Visit: Payer: Self-pay | Admitting: *Deleted

## 2011-09-07 MED ORDER — WARFARIN SODIUM 5 MG PO TABS: 5.0000 mg | ORAL_TABLET | Freq: Every day | ORAL | Status: DC

## 2011-09-20 ENCOUNTER — Telehealth: Payer: Self-pay | Admitting: *Deleted

## 2011-09-20 ENCOUNTER — Ambulatory Visit (INDEPENDENT_AMBULATORY_CARE_PROVIDER_SITE_OTHER): Payer: Medicare Other | Admitting: Family Medicine

## 2011-09-20 DIAGNOSIS — Z5181 Encounter for therapeutic drug level monitoring: Secondary | ICD-10-CM

## 2011-09-20 DIAGNOSIS — Z7901 Long term (current) use of anticoagulants: Secondary | ICD-10-CM

## 2011-09-20 DIAGNOSIS — I4891 Unspecified atrial fibrillation: Secondary | ICD-10-CM

## 2011-09-20 LAB — POCT INR: INR: 1.7

## 2011-09-20 NOTE — Telephone Encounter (Signed)
Patient called stating that she comes in on a regular basis to have her PT checked. Patient states that she has checked the medication list that was given to her and she does not take Claritin, Prevacid, Fioricet and Wellbutrin. Patient wants to know if she should be taking these medications? Please advise.

## 2011-09-20 NOTE — Patient Instructions (Signed)
8 mg daily, 10 mg tues,thur,sat check in 2 weeks (increased 2 mg weekly)

## 2011-09-20 NOTE — Telephone Encounter (Signed)
Patient advised and meds updated

## 2011-09-20 NOTE — Telephone Encounter (Signed)
No, her med list needs to be updated.  Can you take these off when you get a chance and let her know not to take them - we just need to update her med list

## 2011-09-26 ENCOUNTER — Telehealth: Payer: Self-pay | Admitting: Family Medicine

## 2011-09-26 ENCOUNTER — Other Ambulatory Visit: Payer: Self-pay | Admitting: *Deleted

## 2011-09-26 MED ORDER — CLONAZEPAM 1 MG PO TABS: 1.0000 mg | ORAL_TABLET | Freq: Two times a day (BID) | ORAL | Status: DC | PRN

## 2011-09-26 NOTE — Telephone Encounter (Signed)
rx called to pharmacy 

## 2011-09-26 NOTE — Telephone Encounter (Signed)
[employer] and they are faxing prior authorization forms to be filled out

## 2011-09-26 NOTE — Telephone Encounter (Signed)
Pt called has new information regarding a medication refill. Says she need Rx sent to [facility].  Rx# [id number] OR Pt call back # [phone]

## 2011-09-26 NOTE — Telephone Encounter (Signed)
Ok to refill #60, 3 refills 

## 2011-09-26 NOTE — Telephone Encounter (Signed)
The medication is Hydroxyzine Pamoate, says the ins company is limited the amount of pill.  Need call back.Marland Kitchen

## 2011-09-27 NOTE — Telephone Encounter (Signed)
Prior authorization information in your inbox

## 2011-09-30 ENCOUNTER — Telehealth: Payer: Self-pay | Admitting: Family Medicine

## 2011-09-30 NOTE — Telephone Encounter (Signed)
Needs approval for medication.  Please call

## 2011-09-30 NOTE — Telephone Encounter (Signed)
pRIOR AUTHORIZATION ON dR. [name] [address]

## 2011-10-04 ENCOUNTER — Ambulatory Visit (INDEPENDENT_AMBULATORY_CARE_PROVIDER_SITE_OTHER): Payer: Medicare Other | Admitting: Family Medicine

## 2011-10-04 DIAGNOSIS — Z7901 Long term (current) use of anticoagulants: Secondary | ICD-10-CM

## 2011-10-04 DIAGNOSIS — I4891 Unspecified atrial fibrillation: Secondary | ICD-10-CM

## 2011-10-04 DIAGNOSIS — Z5181 Encounter for therapeutic drug level monitoring: Secondary | ICD-10-CM

## 2011-10-04 LAB — POCT INR: INR: 1.8

## 2011-10-04 NOTE — Patient Instructions (Signed)
10 mg daily, except 8 mg T,Th,Sat  check in 2 weeks (increased 2 mg weekly)

## 2011-10-05 ENCOUNTER — Other Ambulatory Visit: Payer: Self-pay | Admitting: *Deleted

## 2011-10-05 MED ORDER — WARFARIN SODIUM 3 MG PO TABS: 3.0000 mg | ORAL_TABLET | ORAL | Status: DC

## 2011-10-05 NOTE — Telephone Encounter (Signed)
The form my need to be sent to Dr. Patsy Lager.Marland Kitchen

## 2011-10-05 NOTE — Telephone Encounter (Signed)
Authorization approved

## 2011-10-06 ENCOUNTER — Telehealth: Payer: Self-pay | Admitting: Family Medicine

## 2011-10-06 NOTE — Telephone Encounter (Signed)
Noted  

## 2011-10-07 ENCOUNTER — Other Ambulatory Visit: Payer: Self-pay | Admitting: *Deleted

## 2011-10-07 MED ORDER — HYDROXYZINE PAMOATE 25 MG PO CAPS: 25.0000 mg | ORAL_CAPSULE | Freq: Four times a day (QID) | ORAL | Status: DC | PRN

## 2011-10-07 NOTE — Telephone Encounter (Signed)
OK to refill

## 2011-10-07 NOTE — Telephone Encounter (Signed)
Pharmacy does not have rx so given 1 year worth refills

## 2011-10-07 NOTE — Telephone Encounter (Signed)
Heather, i really think i just did this. Can you call and check?  If not, ok to refill x 1 year, 90 days

## 2011-10-18 ENCOUNTER — Ambulatory Visit: Payer: Medicare Other

## 2011-10-19 ENCOUNTER — Ambulatory Visit (INDEPENDENT_AMBULATORY_CARE_PROVIDER_SITE_OTHER): Payer: Medicare Other | Admitting: Family Medicine

## 2011-10-19 DIAGNOSIS — Z7901 Long term (current) use of anticoagulants: Secondary | ICD-10-CM

## 2011-10-19 DIAGNOSIS — I4891 Unspecified atrial fibrillation: Secondary | ICD-10-CM

## 2011-10-19 DIAGNOSIS — Z5181 Encounter for therapeutic drug level monitoring: Secondary | ICD-10-CM

## 2011-10-19 NOTE — Patient Instructions (Signed)
5 mg today only then 8 mg daily, except10 mg T,Th,Sat  check in 2 weeks

## 2011-11-02 ENCOUNTER — Ambulatory Visit (INDEPENDENT_AMBULATORY_CARE_PROVIDER_SITE_OTHER): Payer: Medicare Other | Admitting: Family Medicine

## 2011-11-02 DIAGNOSIS — Z5181 Encounter for therapeutic drug level monitoring: Secondary | ICD-10-CM

## 2011-11-02 DIAGNOSIS — I4891 Unspecified atrial fibrillation: Secondary | ICD-10-CM

## 2011-11-02 DIAGNOSIS — Z7901 Long term (current) use of anticoagulants: Secondary | ICD-10-CM

## 2011-11-02 LAB — POCT INR: INR: 3.1

## 2011-11-02 NOTE — Patient Instructions (Signed)
Continue current dose, check in 4 weeks  

## 2011-11-07 ENCOUNTER — Telehealth: Payer: Self-pay | Admitting: *Deleted

## 2011-11-07 MED ORDER — PROMETHAZINE HCL 25 MG PO TABS: 25.0000 mg | ORAL_TABLET | Freq: Four times a day (QID) | ORAL | Status: DC | PRN

## 2011-11-07 MED ORDER — WARFARIN SODIUM 10 MG PO TABS: 10.0000 mg | ORAL_TABLET | Freq: Every day | ORAL | Status: DC

## 2011-11-07 NOTE — Telephone Encounter (Signed)
Can you make sure her pharmacy has a year's worth of refills for phenergan for her

## 2011-11-07 NOTE — Telephone Encounter (Signed)
Call for prior auth and can you show heather how to do

## 2011-11-07 NOTE — Telephone Encounter (Signed)
Prior Berkley Harvey is needed for promethazine.  Do you want me to call for prior auth form or do you want to change medicine.  Letter from [facility] is on your desk.  They have provided pt with a temporary supply.

## 2011-11-07 NOTE — Telephone Encounter (Signed)
Coumadin sent to pharmacy.  Regarding promethazine, pt wants to continue with that and not change to zofran, says zofran doesn't work as well for her.

## 2011-11-07 NOTE — Telephone Encounter (Signed)
Addended by: Eliezer Bottom on: 11/07/2011 03:35 PM   Modules accepted: Orders

## 2011-11-07 NOTE — Telephone Encounter (Signed)
Prior [name] was given over the phone for promethazine but insurance says that since this drug is now non preferred the pt's co pay will go to $12.00.  FYI- insurance prefers generic zofran, with a $5.00 copay, if that's something the pt can be changed to in the future.

## 2011-11-07 NOTE — Telephone Encounter (Signed)
Promethazine sent to pharmacy 

## 2011-11-07 NOTE — Telephone Encounter (Signed)
To: [facility] (Daytime Triage) Fax: [phone] From: [facility] Date/ Time: 11/07/2011 12:08 PM Taken By: Freddie Breech, RN Caller: Darel Hong Facility: not collected Patient: [name], [name] DOB: 1946/02/16 Phone: [phone] Reason for Call: Pt is out of Warfarin 10mg . [facility] Regarding Appointment: Appt Date: Appt Time: Unknown Provider: Reason: Details: Outcome:

## 2011-11-30 ENCOUNTER — Ambulatory Visit (INDEPENDENT_AMBULATORY_CARE_PROVIDER_SITE_OTHER): Payer: Medicare Other | Admitting: Family Medicine

## 2011-11-30 DIAGNOSIS — Z7901 Long term (current) use of anticoagulants: Secondary | ICD-10-CM

## 2011-11-30 DIAGNOSIS — Z5181 Encounter for therapeutic drug level monitoring: Secondary | ICD-10-CM

## 2011-11-30 DIAGNOSIS — I4891 Unspecified atrial fibrillation: Secondary | ICD-10-CM

## 2011-11-30 LAB — POCT INR: INR: 2.3

## 2011-11-30 NOTE — Patient Instructions (Signed)
continue 8 mg daily, except10 mg T,Th,Sat , recheck 4 weeks

## 2011-12-05 ENCOUNTER — Other Ambulatory Visit: Payer: Self-pay | Admitting: *Deleted

## 2011-12-05 MED ORDER — MIRTAZAPINE 45 MG PO TABS: 45.0000 mg | ORAL_TABLET | Freq: Every day | ORAL | Status: DC

## 2011-12-26 ENCOUNTER — Other Ambulatory Visit: Payer: Self-pay | Admitting: *Deleted

## 2011-12-26 MED ORDER — BUTALBITAL-APAP-CAFF-COD 50-325-40-30 MG PO CAPS: 1.0000 | ORAL_CAPSULE | ORAL | Status: AC | PRN

## 2011-12-26 NOTE — Telephone Encounter (Signed)
Ok to refill #40, 0 refills 

## 2011-12-26 NOTE — Telephone Encounter (Signed)
rx called to pharmacy 

## 2011-12-28 ENCOUNTER — Ambulatory Visit (INDEPENDENT_AMBULATORY_CARE_PROVIDER_SITE_OTHER): Payer: Medicare Other | Admitting: Family Medicine

## 2011-12-28 DIAGNOSIS — I4891 Unspecified atrial fibrillation: Secondary | ICD-10-CM

## 2011-12-28 DIAGNOSIS — Z5181 Encounter for therapeutic drug level monitoring: Secondary | ICD-10-CM

## 2011-12-28 DIAGNOSIS — Z7901 Long term (current) use of anticoagulants: Secondary | ICD-10-CM

## 2011-12-28 LAB — POCT INR: INR: 3.4

## 2011-12-28 NOTE — Patient Instructions (Signed)
Hold today then continue8 mg daily, except10 mg T,Th,Sat , recheck 2weeks

## 2012-01-11 ENCOUNTER — Ambulatory Visit (INDEPENDENT_AMBULATORY_CARE_PROVIDER_SITE_OTHER): Payer: PRIVATE HEALTH INSURANCE | Admitting: Family Medicine

## 2012-01-11 DIAGNOSIS — I4891 Unspecified atrial fibrillation: Secondary | ICD-10-CM

## 2012-01-11 LAB — POCT INR: INR: 3.3

## 2012-01-11 NOTE — Patient Instructions (Signed)
Hold today then continue8 mg daily, except10 mg T,Th,Sat , recheck 2weeks 

## 2012-01-24 ENCOUNTER — Other Ambulatory Visit: Payer: Self-pay | Admitting: *Deleted

## 2012-01-24 ENCOUNTER — Ambulatory Visit (INDEPENDENT_AMBULATORY_CARE_PROVIDER_SITE_OTHER): Payer: PRIVATE HEALTH INSURANCE | Admitting: Family Medicine

## 2012-01-24 DIAGNOSIS — I4891 Unspecified atrial fibrillation: Secondary | ICD-10-CM

## 2012-01-24 DIAGNOSIS — Z7901 Long term (current) use of anticoagulants: Secondary | ICD-10-CM

## 2012-01-24 DIAGNOSIS — Z5181 Encounter for therapeutic drug level monitoring: Secondary | ICD-10-CM

## 2012-01-24 MED ORDER — CLONAZEPAM 1 MG PO TABS: 1.0000 mg | ORAL_TABLET | Freq: Two times a day (BID) | ORAL | Status: DC | PRN

## 2012-01-24 NOTE — Telephone Encounter (Signed)
Please refill #60, 3 refills

## 2012-01-24 NOTE — Patient Instructions (Signed)
continue 8 mg daily, except10 mg T,Th,Sat , recheck 4 weeks 

## 2012-01-24 NOTE — Telephone Encounter (Signed)
Pt is going out of town for a week only has 5 days of klonipin left, needs new rx called in. She needs rx by today, leaves tomorrow am

## 2012-01-24 NOTE — Telephone Encounter (Signed)
Patient advised rx called to [facility]

## 2012-02-07 ENCOUNTER — Other Ambulatory Visit: Payer: Self-pay | Admitting: *Deleted

## 2012-02-07 MED ORDER — HYDROXYZINE PAMOATE 25 MG PO CAPS: 25.0000 mg | ORAL_CAPSULE | Freq: Four times a day (QID) | ORAL | Status: DC | PRN

## 2012-02-07 NOTE — Telephone Encounter (Signed)
Received faxed refill request from pharmacy. Is it okay to refill? 

## 2012-02-17 ENCOUNTER — Telehealth: Payer: Self-pay | Admitting: Family Medicine

## 2012-02-17 NOTE — Telephone Encounter (Signed)
Patient advised to take Friday dose not to make up thursdays but, let lab know when she comes in that she missed a dose

## 2012-02-17 NOTE — Telephone Encounter (Signed)
Caller: Ndia/Patient is calling with a question about Coumadin.The medication was written by [name], Karleen Hampshire T.  Pt normally takes Coumadin daily,  M, W, F, Sun 8mg , and T, Th, Sat 10mg .  Completely  forgot to take her  Coumadin on Thursday 06/20.  Asking if she needs to take 8 or 10 mg today (June 21), has PT/INR scheduled for 06/25 due to going out of town on 06/26 for several times.   OFFICE PLEASE CALL TO ADVISE.

## 2012-02-20 ENCOUNTER — Ambulatory Visit: Payer: PRIVATE HEALTH INSURANCE

## 2012-02-21 ENCOUNTER — Ambulatory Visit: Payer: PRIVATE HEALTH INSURANCE

## 2012-02-22 ENCOUNTER — Ambulatory Visit (INDEPENDENT_AMBULATORY_CARE_PROVIDER_SITE_OTHER): Payer: PRIVATE HEALTH INSURANCE | Admitting: Family Medicine

## 2012-02-22 DIAGNOSIS — Z5181 Encounter for therapeutic drug level monitoring: Secondary | ICD-10-CM

## 2012-02-22 DIAGNOSIS — Z7901 Long term (current) use of anticoagulants: Secondary | ICD-10-CM

## 2012-02-22 DIAGNOSIS — I4891 Unspecified atrial fibrillation: Secondary | ICD-10-CM

## 2012-02-22 NOTE — Patient Instructions (Signed)
Continue 8 mg daily, except 10 mg Tu, Thur, Sat, recheck 2 weeks

## 2012-03-07 ENCOUNTER — Ambulatory Visit (INDEPENDENT_AMBULATORY_CARE_PROVIDER_SITE_OTHER): Payer: PRIVATE HEALTH INSURANCE | Admitting: Family Medicine

## 2012-03-07 DIAGNOSIS — Z5181 Encounter for therapeutic drug level monitoring: Secondary | ICD-10-CM

## 2012-03-07 DIAGNOSIS — I4891 Unspecified atrial fibrillation: Secondary | ICD-10-CM

## 2012-03-07 DIAGNOSIS — Z7901 Long term (current) use of anticoagulants: Secondary | ICD-10-CM

## 2012-03-07 NOTE — Patient Instructions (Signed)
Continue 8 mg daily, except 10 mg Tu, Thur, Sat, recheck 4 weeks 

## 2012-03-12 ENCOUNTER — Other Ambulatory Visit: Payer: Self-pay | Admitting: *Deleted

## 2012-03-12 MED ORDER — WARFARIN SODIUM 5 MG PO TABS: 5.0000 mg | ORAL_TABLET | Freq: Every day | ORAL | Status: DC

## 2012-03-12 NOTE — Telephone Encounter (Signed)
Received faxed refill request from pharmacy. Refill sent to pharmacy electronically. 

## 2012-03-26 ENCOUNTER — Other Ambulatory Visit: Payer: Self-pay | Admitting: *Deleted

## 2012-03-26 MED ORDER — MIRTAZAPINE 45 MG PO TABS: 45.0000 mg | ORAL_TABLET | Freq: Every day | ORAL | Status: DC

## 2012-04-02 ENCOUNTER — Ambulatory Visit: Payer: PRIVATE HEALTH INSURANCE | Admitting: Family Medicine

## 2012-04-02 DIAGNOSIS — Z0289 Encounter for other administrative examinations: Secondary | ICD-10-CM

## 2012-04-03 ENCOUNTER — Ambulatory Visit (INDEPENDENT_AMBULATORY_CARE_PROVIDER_SITE_OTHER): Payer: PRIVATE HEALTH INSURANCE | Admitting: Family Medicine

## 2012-04-03 DIAGNOSIS — Z7901 Long term (current) use of anticoagulants: Secondary | ICD-10-CM

## 2012-04-03 DIAGNOSIS — Z5181 Encounter for therapeutic drug level monitoring: Secondary | ICD-10-CM

## 2012-04-03 DIAGNOSIS — I4891 Unspecified atrial fibrillation: Secondary | ICD-10-CM

## 2012-04-03 NOTE — Patient Instructions (Signed)
Continue current dose, check in 4 weeks  

## 2012-04-05 ENCOUNTER — Ambulatory Visit: Payer: PRIVATE HEALTH INSURANCE | Admitting: Family Medicine

## 2012-04-10 ENCOUNTER — Telehealth: Payer: Self-pay | Admitting: *Deleted

## 2012-04-10 NOTE — Telephone Encounter (Signed)
Call patient and ask her which she is taking now?

## 2012-04-10 NOTE — Telephone Encounter (Signed)
Refill request from [facility] for Prevacid. Not on current med list. Protonix is on current list. Please advise.

## 2012-04-11 MED ORDER — LANSOPRAZOLE 30 MG PO CPDR: 30.0000 mg | DELAYED_RELEASE_CAPSULE | Freq: Every day | ORAL | Status: DC

## 2012-04-11 NOTE — Telephone Encounter (Signed)
Pt called;has not taken pantaprazole in over a year; pt presently taking Lansoprazole 30 mg taking one capsule daily. Pt states she has 4 pills left.[facility].

## 2012-04-11 NOTE — Telephone Encounter (Signed)
Please refill prevacid 30 mg, 1 po daily, #30, 11 refills

## 2012-04-17 ENCOUNTER — Telehealth: Payer: Self-pay

## 2012-04-17 NOTE — Telephone Encounter (Signed)
Pt having weight loss and hallucinations, no desire to go outside; pts thinks meds for depression and anxiety may be causing; pt wants to stop depression and anxiety meds. Pt last seen 05/11/11. Pt scheduled appt to see Dr Patsy Lager 04/18/12 at 10:30 for 30 min appt. Pt advised if condition changes or worsens to call back.

## 2012-04-17 NOTE — Telephone Encounter (Signed)
Agree with ASAP appointment.

## 2012-04-18 ENCOUNTER — Telehealth: Payer: Self-pay | Admitting: Family Medicine

## 2012-04-18 ENCOUNTER — Other Ambulatory Visit: Payer: Self-pay | Admitting: *Deleted

## 2012-04-18 ENCOUNTER — Ambulatory Visit (INDEPENDENT_AMBULATORY_CARE_PROVIDER_SITE_OTHER): Payer: PRIVATE HEALTH INSURANCE | Admitting: Family Medicine

## 2012-04-18 ENCOUNTER — Telehealth: Payer: Self-pay | Admitting: *Deleted

## 2012-04-18 ENCOUNTER — Encounter: Payer: Self-pay | Admitting: Family Medicine

## 2012-04-18 ENCOUNTER — Ambulatory Visit: Payer: PRIVATE HEALTH INSURANCE | Admitting: Family Medicine

## 2012-04-18 VITALS — BP 100/72 | HR 100 | Temp 98.3°F | Ht 69.0 in | Wt 150.5 lb

## 2012-04-18 DIAGNOSIS — R443 Hallucinations, unspecified: Secondary | ICD-10-CM

## 2012-04-18 DIAGNOSIS — F419 Anxiety disorder, unspecified: Secondary | ICD-10-CM

## 2012-04-18 DIAGNOSIS — F329 Major depressive disorder, single episode, unspecified: Secondary | ICD-10-CM

## 2012-04-18 DIAGNOSIS — F411 Generalized anxiety disorder: Secondary | ICD-10-CM

## 2012-04-18 DIAGNOSIS — F32A Depression, unspecified: Secondary | ICD-10-CM

## 2012-04-18 DIAGNOSIS — R110 Nausea: Secondary | ICD-10-CM

## 2012-04-18 MED ORDER — ONDANSETRON HCL 8 MG PO TABS: 8.0000 mg | ORAL_TABLET | Freq: Three times a day (TID) | ORAL | Status: DC | PRN

## 2012-04-18 NOTE — Telephone Encounter (Signed)
Called pharmacy to request that all refills for promethazine be canceled and be inactivated per verbal from Dr.[name]. Received bottle of promethazine and put in bio-hazard container to destroy.

## 2012-04-18 NOTE — Patient Instructions (Addendum)
REFERRAL: GO THE THE FRONT ROOM AT THE ENTRANCE OF OUR CLINIC, NEAR CHECK IN. ASK FOR MARION. SHE WILL HELP YOU SET UP YOUR REFERRAL. DATE: TIME:  
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
## 2012-04-20 NOTE — Telephone Encounter (Signed)
error 

## 2012-05-02 ENCOUNTER — Ambulatory Visit (INDEPENDENT_AMBULATORY_CARE_PROVIDER_SITE_OTHER): Payer: PRIVATE HEALTH INSURANCE | Admitting: Family Medicine

## 2012-05-02 DIAGNOSIS — I4891 Unspecified atrial fibrillation: Secondary | ICD-10-CM

## 2012-05-02 DIAGNOSIS — Z7901 Long term (current) use of anticoagulants: Secondary | ICD-10-CM

## 2012-05-02 DIAGNOSIS — Z5181 Encounter for therapeutic drug level monitoring: Secondary | ICD-10-CM

## 2012-05-02 NOTE — Patient Instructions (Signed)
Continue current dose, check in 4 weeks  

## 2012-05-07 ENCOUNTER — Other Ambulatory Visit: Payer: Self-pay | Admitting: *Deleted

## 2012-05-07 MED ORDER — WARFARIN SODIUM 3 MG PO TABS: 3.0000 mg | ORAL_TABLET | ORAL | Status: DC

## 2012-05-07 MED ORDER — LEVOTHYROXINE SODIUM 50 MCG PO TABS: 50.0000 ug | ORAL_TABLET | Freq: Every day | ORAL | Status: DC

## 2012-05-24 ENCOUNTER — Encounter: Payer: Self-pay | Admitting: Cardiology

## 2012-05-24 ENCOUNTER — Ambulatory Visit (INDEPENDENT_AMBULATORY_CARE_PROVIDER_SITE_OTHER): Payer: PRIVATE HEALTH INSURANCE | Admitting: Cardiology

## 2012-05-24 ENCOUNTER — Other Ambulatory Visit: Payer: Self-pay | Admitting: *Deleted

## 2012-05-24 ENCOUNTER — Encounter (INDEPENDENT_AMBULATORY_CARE_PROVIDER_SITE_OTHER): Payer: PRIVATE HEALTH INSURANCE

## 2012-05-24 VITALS — BP 129/99 | HR 132 | Ht 68.0 in | Wt 156.0 lb

## 2012-05-24 DIAGNOSIS — E785 Hyperlipidemia, unspecified: Secondary | ICD-10-CM

## 2012-05-24 DIAGNOSIS — I080 Rheumatic disorders of both mitral and aortic valves: Secondary | ICD-10-CM

## 2012-05-24 DIAGNOSIS — I4891 Unspecified atrial fibrillation: Secondary | ICD-10-CM

## 2012-05-24 NOTE — Patient Instructions (Addendum)
Continue current medications as listed.  Your physician has recommended that you wear a holter monitor for 24 hours. Holter monitors are medical devices that record the heart's electrical activity. Doctors most often use these monitors to diagnose arrhythmias. Arrhythmias are problems with the speed or rhythm of the heartbeat. The monitor is a small, portable device. You can wear one while you do your normal daily activities. This is usually used to diagnose what is causing palpitations/syncope (passing out).  Follow up will be based on these results.  If At Fib/flutter persist, you will be cardioverted.  Electrical Cardioversion Cardioversion is the delivery of a jolt of electricity to change the rhythm of the heart. Sticky patches or metal paddles are placed on the chest to deliver the electricity from a special device. This is done to restore a normal rhythm. A rhythm that is too fast or not regular keeps the heart from pumping well. Compared to medicines used to change an abnormal rhythm, cardioversion is faster and works better. It is also unpleasant and may dislodge blood clots from the heart. WHEN WOULD THIS BE DONE?  In an emergency:   There is low or no blood pressure as a result of the heart rhythm.   Normal rhythm must be restored as fast as possible to protect the brain and heart from further damage.   It may save a life.   For less serious heart rhythms, such as atrial fibrillation or flutter, in which:   The heart is beating too fast or is not regular.   The heart is still able to pump enough blood, but not as well as it should.   Medicine to change the rhythm has not worked.   It is safe to wait in order to allow time for preparation.  LET YOUR CAREGIVER KNOW ABOUT:   Every medicine you are taking. It is very important to do this! Know when to take or stop taking any of them.   Any time in the past that you have felt your heart was not beating normally.  RISKS AND  COMPLICATIONS   Clots may form in the chambers of the heart if it is beating too fast. These clots may be dislodged during the procedure and travel to other parts of the body.   There is risk of a stroke during and after the procedure if a clot moves. Blood thinners lower this risk.   You may have a special test of your heart (TEE) to make sure there are no clots in your heart.  BEFORE THE PROCEDURE   You may have some tests to see how well your heart is working.   You may start taking blood thinners so your blood does not clot as easily.   Other drugs may be given to help your heart work better.  PROCEDURE (SCHEDULED)  The procedure is typically done in a hospital by a heart doctor (cardiologist).   You will be told when and where to go.   You may be given some medicine through an intravenous (IV) access to reduce discomfort and make you sleepy before the procedure.   Your whole body may move when the shock is delivered. Your chest may feel sore.   You may be able to go home after a few hours. Your heart rhythm will be watched to make sure it does not change.  HOME CARE INSTRUCTIONS   Only take medicine as directed by your caregiver. Be sure you understand how and when to take  your medicine.   Learn how to feel your pulse and check it often.   Limit your activity for 48 hours.   Avoid caffeine and other stimulants as directed.  SEEK MEDICAL CARE IF:   You feel like your heart is beating too fast or your pulse is not regular.   You have any questions about your medicines.   You have bleeding that will not stop.  SEEK IMMEDIATE MEDICAL CARE IF:   You are dizzy or feel faint.   It is hard to breathe or you feel short of breath.   There is a change in discomfort in your chest.   Your speech is slurred or you have trouble moving your arm or leg on one side.   You get a muscle cramp.   Your fingers or toes turn cold or blue.  MAKE SURE YOU:   Understand these  instructions.   Will watch your condition.   Will get help right away if you are not doing well or get worse.  Document Released: 08/05/2002 Document Revised: 08/04/2011 Document Reviewed: 12/05/2007 [facility] Patient Information 2012 [place], [place].
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
## 2012-05-25 ENCOUNTER — Telehealth: Payer: Self-pay | Admitting: Family Medicine

## 2012-05-25 ENCOUNTER — Other Ambulatory Visit: Payer: Self-pay | Admitting: Family Medicine

## 2012-05-25 DIAGNOSIS — Z1211 Encounter for screening for malignant neoplasm of colon: Secondary | ICD-10-CM

## 2012-05-25 NOTE — Telephone Encounter (Signed)
Made   [name], MD 05/25/2012, 9:41 AM

## 2012-05-25 NOTE — Telephone Encounter (Signed)
Consult Dr. Santa Genera

## 2012-05-25 NOTE — Telephone Encounter (Signed)
Patient called to ask you to now refer her to Dr Santa Genera for a Colonoscopy, she has changed her mind and doesn't want [facility]. She has cancelled the appt with them. Wants to have it done before the end of the year if possible. If Dr Doristine Counter cant do it then she wants Dr Niel Hummer with [facility]. [phone] or [phone]. Pls place new GI referral so we can track it!

## 2012-05-25 NOTE — Progress Notes (Signed)
All done at patient request for specific MD's.

## 2012-05-28 ENCOUNTER — Encounter: Payer: Self-pay | Admitting: *Deleted

## 2012-05-30 ENCOUNTER — Ambulatory Visit: Payer: PRIVATE HEALTH INSURANCE

## 2012-05-31 ENCOUNTER — Ambulatory Visit (INDEPENDENT_AMBULATORY_CARE_PROVIDER_SITE_OTHER): Payer: PRIVATE HEALTH INSURANCE | Admitting: Family Medicine

## 2012-05-31 ENCOUNTER — Ambulatory Visit: Payer: PRIVATE HEALTH INSURANCE

## 2012-05-31 DIAGNOSIS — I4891 Unspecified atrial fibrillation: Secondary | ICD-10-CM

## 2012-05-31 DIAGNOSIS — Z5181 Encounter for therapeutic drug level monitoring: Secondary | ICD-10-CM

## 2012-05-31 DIAGNOSIS — Z7901 Long term (current) use of anticoagulants: Secondary | ICD-10-CM

## 2012-05-31 LAB — POCT INR: INR: 2

## 2012-05-31 NOTE — Patient Instructions (Signed)
Continue current dose, check in 4 weeks  

## 2012-06-03 ENCOUNTER — Other Ambulatory Visit: Payer: Self-pay | Admitting: Cardiology

## 2012-06-03 DIAGNOSIS — I4892 Unspecified atrial flutter: Secondary | ICD-10-CM

## 2012-06-04 ENCOUNTER — Ambulatory Visit (HOSPITAL_COMMUNITY): Payer: Medicare Other | Admitting: Anesthesiology

## 2012-06-04 ENCOUNTER — Encounter (HOSPITAL_COMMUNITY): Payer: Self-pay | Admitting: Anesthesiology

## 2012-06-04 ENCOUNTER — Ambulatory Visit (HOSPITAL_COMMUNITY)
Admission: RE | Admit: 2012-06-04 | Discharge: 2012-06-04 | Disposition: A | Discharge status: Home / Self Care | Payer: Medicare Other | Source: Ambulatory Visit | Attending: Cardiology | Admitting: Cardiology

## 2012-06-04 ENCOUNTER — Encounter (HOSPITAL_COMMUNITY)
Admission: RE | Disposition: A | Discharge status: Home / Self Care | Payer: Self-pay | Source: Ambulatory Visit | Attending: Cardiology

## 2012-06-04 ENCOUNTER — Encounter (HOSPITAL_COMMUNITY): Payer: Self-pay

## 2012-06-04 DIAGNOSIS — I4892 Unspecified atrial flutter: Secondary | ICD-10-CM | POA: Insufficient documentation

## 2012-06-04 DIAGNOSIS — I4891 Unspecified atrial fibrillation: Secondary | ICD-10-CM | POA: Insufficient documentation

## 2012-06-04 HISTORY — PX: CARDIOVERSION: SHX1299

## 2012-06-04 SURGERY — CARDIOVERSION
Anesthesia: Monitor Anesthesia Care | Wound class: Clean

## 2012-06-04 MED ORDER — POTASSIUM CHLORIDE IN NACL 20-0.9 MEQ/L-% IV SOLN: INTRAVENOUS | Status: DC

## 2012-06-04 MED ORDER — PROPOFOL 10 MG/ML IV BOLUS
INTRAVENOUS | Status: DC | PRN
  Administered 2012-06-04: 50 mg via INTRAVENOUS

## 2012-06-04 MED ORDER — SODIUM CHLORIDE 0.9 % IV SOLN
INTRAVENOUS | Status: DC | PRN
  Administered 2012-06-04: 13:00:00 via INTRAVENOUS

## 2012-06-04 NOTE — Transfer of Care (Signed)
Immediate Anesthesia Transfer of Care Note  Patient: [name]  Procedure(s) Performed: Procedure(s) (LRB) with comments: CARDIOVERSION (N/A)  Patient Location: PACU and Endoscopy Unit  Anesthesia Type: MAC  Level of Consciousness: awake, alert , oriented and patient cooperative  Airway & Oxygen Therapy: Patient Spontanous Breathing and Patient connected to nasal cannula oxygen  Post-op Assessment: Report given to PACU RN, Post -op Vital signs reviewed and stable and Patient moving all extremities X 4  Post vital signs: Reviewed and stable  Complications: No apparent anesthesia complications

## 2012-06-04 NOTE — Anesthesia Preprocedure Evaluation (Addendum)
Anesthesia Evaluation  Patient identified by MRN, date of birth, ID band Patient awake    Reviewed: Allergy & Precautions, H&P , NPO status , Patient's Chart, lab work & pertinent test results  Airway Mallampati: I TM Distance: >3 FB Neck ROM: Full    Dental  (+) Teeth Intact and Dental Advisory Given   Pulmonary asthma ,          Cardiovascular + dysrhythmias Atrial Fibrillation Rhythm:irregular Rate:Normal     Neuro/Psych  Headaches,    GI/Hepatic GERD-  Medicated,  Endo/Other  Hypothyroidism   Renal/GU      Musculoskeletal   Abdominal   Peds  Hematology   Anesthesia Other Findings   Reproductive/Obstetrics                         Anesthesia Physical Anesthesia Plan  ASA: III  Anesthesia Plan: General   Post-op Pain Management:    Induction: Intravenous  Airway Management Planned: Mask  Additional Equipment:   Intra-op Plan:   Post-operative Plan:   Informed Consent: I have reviewed the patients History and Physical, chart, labs and discussed the procedure including the risks, benefits and alternatives for the proposed anesthesia with the patient or authorized representative who has indicated his/her understanding and acceptance.     Plan Discussed with: CRNA, Anesthesiologist and Surgeon  Anesthesia Plan Comments:         Anesthesia Quick Evaluation

## 2012-06-04 NOTE — Anesthesia Postprocedure Evaluation (Signed)
  Anesthesia Post-op Note  Patient: [name]  Procedure(s) Performed: Procedure(s) (LRB) with comments: CARDIOVERSION (N/A)  Patient Location: PACU and Endoscopy Unit  Anesthesia Type: MAC  Level of Consciousness: awake, alert  and oriented  Airway and Oxygen Therapy: Patient Spontanous Breathing  Post-op Pain: none  Post-op Assessment: Post-op Vital signs reviewed, Patient's Cardiovascular Status Stable, Respiratory Function Stable, Patent Airway, No signs of Nausea or vomiting, Adequate PO intake and Pain level controlled  Post-op Vital Signs: Reviewed and stable  Complications: No apparent anesthesia complications

## 2012-06-04 NOTE — CV Procedure (Signed)
The patient was prepared carefully for cardioversion. Anesthesia was present. Permit was signed. Time out was done.  The biphasic defibrillator was used. Anterior posterior pads were placed.  The patient received 50 mg of IV propofol from anesthesia.  The patient was shocked with 120 J of energy. She converted immediately on the first shock to sinus rhythm. She is stable and woke up without difficulties. One shock was used. She returned to sinus rhythm.  The patient will remain on her home medicines. I've spoken with her husband.  Jerral Bonito, MD
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
## 2012-06-05 ENCOUNTER — Encounter (HOSPITAL_COMMUNITY): Payer: Self-pay | Admitting: Cardiology

## 2012-06-25 ENCOUNTER — Other Ambulatory Visit: Payer: Self-pay | Admitting: *Deleted

## 2012-06-25 MED ORDER — ONDANSETRON 4 MG PO TBDP: 4.0000 mg | ORAL_TABLET | Freq: Four times a day (QID) | ORAL | Status: DC | PRN

## 2012-06-26 ENCOUNTER — Other Ambulatory Visit: Payer: Self-pay | Admitting: *Deleted

## 2012-06-26 NOTE — Telephone Encounter (Signed)
Yes

## 2012-06-26 NOTE — Telephone Encounter (Signed)
Received a prior auth request for pt's zofran 4 mg odt. I called insurance and was told that the odt was not authorized at the quantity given, but the 8 mg she was prescribed in August would be authorized at the quantity of # 40 with a $5.00 copay. Is it ok to have the 8 mg filled or does she need the 4 mg odt?

## 2012-06-27 ENCOUNTER — Ambulatory Visit (INDEPENDENT_AMBULATORY_CARE_PROVIDER_SITE_OTHER): Payer: Medicare Other | Admitting: Family Medicine

## 2012-06-27 DIAGNOSIS — I4891 Unspecified atrial fibrillation: Secondary | ICD-10-CM

## 2012-06-27 DIAGNOSIS — Z5181 Encounter for therapeutic drug level monitoring: Secondary | ICD-10-CM

## 2012-06-27 DIAGNOSIS — Z7901 Long term (current) use of anticoagulants: Secondary | ICD-10-CM

## 2012-06-27 MED ORDER — ONDANSETRON HCL 8 MG PO TABS: 8.0000 mg | ORAL_TABLET | Freq: Three times a day (TID) | ORAL | Status: AC | PRN

## 2012-06-27 NOTE — Patient Instructions (Signed)
Continue 8 mg daily, except 10 mg Tu, Thur, Sat, recheck 4 weeks

## 2012-06-28 ENCOUNTER — Ambulatory Visit: Payer: PRIVATE HEALTH INSURANCE

## 2012-07-23 ENCOUNTER — Ambulatory Visit (INDEPENDENT_AMBULATORY_CARE_PROVIDER_SITE_OTHER): Payer: Medicare Other | Admitting: Family Medicine

## 2012-07-23 DIAGNOSIS — I4891 Unspecified atrial fibrillation: Secondary | ICD-10-CM

## 2012-07-23 DIAGNOSIS — Z5181 Encounter for therapeutic drug level monitoring: Secondary | ICD-10-CM

## 2012-07-23 DIAGNOSIS — Z7901 Long term (current) use of anticoagulants: Secondary | ICD-10-CM

## 2012-07-23 LAB — POCT INR: INR: 2.4

## 2012-07-23 NOTE — Patient Instructions (Addendum)
Continue 8 mg daily, except 10 mg Tu, Thur, Sat, recheck 4 weeks 

## 2012-08-06 ENCOUNTER — Other Ambulatory Visit: Payer: Self-pay | Admitting: *Deleted

## 2012-08-06 MED ORDER — WARFARIN SODIUM 5 MG PO TABS: 5.0000 mg | ORAL_TABLET | Freq: Every day | ORAL | Status: DC

## 2012-08-30 ENCOUNTER — Ambulatory Visit (INDEPENDENT_AMBULATORY_CARE_PROVIDER_SITE_OTHER): Payer: Medicare Other | Admitting: General Practice

## 2012-08-30 DIAGNOSIS — I4891 Unspecified atrial fibrillation: Secondary | ICD-10-CM

## 2012-09-10 ENCOUNTER — Other Ambulatory Visit: Payer: Self-pay | Admitting: Family Medicine

## 2012-09-10 ENCOUNTER — Encounter: Payer: Self-pay | Admitting: Family Medicine

## 2012-09-10 ENCOUNTER — Ambulatory Visit
Admission: RE | Admit: 2012-09-10 | Discharge: 2012-09-10 | Disposition: A | Discharge status: Home / Self Care | Payer: Medicare Other | Source: Ambulatory Visit | Attending: Family Medicine | Admitting: Family Medicine

## 2012-09-10 ENCOUNTER — Ambulatory Visit (INDEPENDENT_AMBULATORY_CARE_PROVIDER_SITE_OTHER): Payer: Medicare Other | Admitting: Family Medicine

## 2012-09-10 VITALS — BP 120/72 | HR 93 | Temp 97.8°F | Ht 68.0 in | Wt 157.0 lb

## 2012-09-10 DIAGNOSIS — R300 Dysuria: Secondary | ICD-10-CM

## 2012-09-10 DIAGNOSIS — R10814 Left lower quadrant abdominal tenderness: Secondary | ICD-10-CM

## 2012-09-10 DIAGNOSIS — E039 Hypothyroidism, unspecified: Secondary | ICD-10-CM

## 2012-09-10 LAB — CBC WITH DIFFERENTIAL/PLATELET
Basophils Absolute: 0 K/uL (ref 0.0–0.1)
Basophils Relative: 0 % (ref 0–1)
Eosinophils Absolute: 0 K/uL (ref 0.0–0.7)
Eosinophils Relative: 1 % (ref 0–5)
HCT: 42.5 % (ref 36.0–46.0)
Hemoglobin: 14.8 g/dL (ref 12.0–15.0)
Lymphocytes Relative: 22 % (ref 12–46)
Lymphs Abs: 1.4 K/uL (ref 0.7–4.0)
MCH: 32.9 pg (ref 26.0–34.0)
MCHC: 34.8 g/dL (ref 30.0–36.0)
MCV: 94.4 fL (ref 78.0–100.0)
Monocytes Absolute: 0.4 K/uL (ref 0.1–1.0)
Monocytes Relative: 6 % (ref 3–12)
Neutro Abs: 4.5 K/uL (ref 1.7–7.7)
Neutrophils Relative %: 71 % (ref 43–77)
Platelets: 238 K/uL (ref 150–400)
RBC: 4.5 MIL/uL (ref 3.87–5.11)
RDW: 13.7 % (ref 11.5–15.5)
WBC: 6.4 K/uL (ref 4.0–10.5)

## 2012-09-10 LAB — HEPATIC FUNCTION PANEL
Albumin: 4.5 g/dL (ref 3.5–5.2)
Indirect Bilirubin: 0.4 mg/dL (ref 0.0–0.9)
Total Protein: 6.9 g/dL (ref 6.0–8.3)

## 2012-09-10 LAB — TSH: TSH: 2.339 u[IU]/mL (ref 0.350–4.500)

## 2012-09-10 LAB — BASIC METABOLIC PANEL
CO2: 25 mEq/L (ref 19–32)
Chloride: 102 mEq/L (ref 96–112)
Sodium: 138 mEq/L (ref 135–145)

## 2012-09-10 LAB — POCT URINALYSIS DIPSTICK
Bilirubin, UA: NEGATIVE
Blood, UA: NEGATIVE
Glucose, UA: NEGATIVE
Nitrite, UA: NEGATIVE

## 2012-09-10 LAB — LIPASE: Lipase: 33 U/L (ref 0–75)

## 2012-09-10 MED ORDER — TRAMADOL HCL 50 MG PO TABS: 50.0000 mg | ORAL_TABLET | Freq: Four times a day (QID) | ORAL | Status: DC | PRN

## 2012-09-10 MED ORDER — IOHEXOL 300 MG/ML  SOLN
100.0000 mL | Freq: Once | INTRAMUSCULAR | Status: AC | PRN
  Administered 2012-09-10: 100 mL via INTRAVENOUS

## 2012-09-10 MED ORDER — CIPROFLOXACIN HCL 750 MG PO TABS: 750.0000 mg | ORAL_TABLET | Freq: Two times a day (BID) | ORAL | Status: DC

## 2012-09-10 MED ORDER — METRONIDAZOLE 500 MG PO TABS: 500.0000 mg | ORAL_TABLET | Freq: Three times a day (TID) | ORAL | Status: DC

## 2012-09-10 NOTE — Patient Instructions (Addendum)
REFERRAL: GO THE THE FRONT ROOM AT THE ENTRANCE OF OUR CLINIC, NEAR CHECK IN. ASK FOR [name]. SHE WILL HELP YOU SET UP YOUR REFERRAL. DATE: TIME:  
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
## 2012-09-12 LAB — URINE CULTURE: Colony Count: >=100000

## 2012-09-17 ENCOUNTER — Other Ambulatory Visit: Payer: Self-pay | Admitting: General Practice

## 2012-09-17 ENCOUNTER — Telehealth: Payer: Self-pay

## 2012-09-17 ENCOUNTER — Ambulatory Visit (INDEPENDENT_AMBULATORY_CARE_PROVIDER_SITE_OTHER): Payer: Medicare Other | Admitting: General Practice

## 2012-09-17 DIAGNOSIS — I4891 Unspecified atrial fibrillation: Secondary | ICD-10-CM

## 2012-09-17 MED ORDER — ONDANSETRON 4 MG PO TBDP: 8.0000 mg | ORAL_TABLET | Freq: Three times a day (TID) | ORAL | Status: DC | PRN

## 2012-09-17 NOTE — Telephone Encounter (Signed)
Prior auth needed for zofran; form given to [employer].

## 2012-09-18 NOTE — Telephone Encounter (Signed)
IN YOUR BOX

## 2012-09-20 NOTE — Telephone Encounter (Addendum)
Laurice Record with [facility] [phone] called for additional info for ondansetron prior auth; gave diagnosis chronic nausea 787.02. Tram with pharmacy gave verbal approval until 08/28/13. [facility] pharmacy notified; spoke with Rich;rx did not go thru. Will f/u with Rich. Called Rich at [facility] pharmacy rx did go thru and Rich notified pt.

## 2012-09-21 ENCOUNTER — Other Ambulatory Visit: Payer: Self-pay | Admitting: *Deleted

## 2012-09-24 MED ORDER — BUTALBITAL-APAP-CAFF-COD 50-325-40-30 MG PO CAPS: 1.0000 | ORAL_CAPSULE | ORAL | Status: DC | PRN

## 2012-09-24 NOTE — Telephone Encounter (Signed)
Ok to refill #30, 3 refills

## 2012-09-24 NOTE — Telephone Encounter (Signed)
rx called to parmacy

## 2012-09-27 ENCOUNTER — Ambulatory Visit: Payer: Medicare Other

## 2012-09-27 ENCOUNTER — Ambulatory Visit (INDEPENDENT_AMBULATORY_CARE_PROVIDER_SITE_OTHER): Payer: Medicare Other | Admitting: General Practice

## 2012-09-27 DIAGNOSIS — I4891 Unspecified atrial fibrillation: Secondary | ICD-10-CM

## 2012-09-27 DIAGNOSIS — Z7901 Long term (current) use of anticoagulants: Secondary | ICD-10-CM

## 2012-09-27 DIAGNOSIS — Z5181 Encounter for therapeutic drug level monitoring: Secondary | ICD-10-CM

## 2012-09-27 LAB — POCT INR: INR: 1.5

## 2012-10-11 ENCOUNTER — Ambulatory Visit: Payer: Medicare Other

## 2012-10-15 ENCOUNTER — Ambulatory Visit (INDEPENDENT_AMBULATORY_CARE_PROVIDER_SITE_OTHER): Payer: Medicare Other | Admitting: General Practice

## 2012-10-15 DIAGNOSIS — I4891 Unspecified atrial fibrillation: Secondary | ICD-10-CM

## 2012-10-15 DIAGNOSIS — Z5181 Encounter for therapeutic drug level monitoring: Secondary | ICD-10-CM

## 2012-10-15 DIAGNOSIS — Z7901 Long term (current) use of anticoagulants: Secondary | ICD-10-CM

## 2012-10-15 LAB — POCT INR: INR: 1.8

## 2012-11-05 ENCOUNTER — Ambulatory Visit (INDEPENDENT_AMBULATORY_CARE_PROVIDER_SITE_OTHER): Payer: Medicare Other | Admitting: General Practice

## 2012-11-05 DIAGNOSIS — I4891 Unspecified atrial fibrillation: Secondary | ICD-10-CM

## 2012-11-05 DIAGNOSIS — Z5181 Encounter for therapeutic drug level monitoring: Secondary | ICD-10-CM

## 2012-11-05 DIAGNOSIS — Z7901 Long term (current) use of anticoagulants: Secondary | ICD-10-CM

## 2012-11-05 LAB — POCT INR: INR: 2

## 2012-11-13 ENCOUNTER — Other Ambulatory Visit: Payer: Self-pay | Admitting: *Deleted

## 2012-11-13 MED ORDER — ONDANSETRON 4 MG PO TBDP: 4.0000 mg | ORAL_TABLET | Freq: Three times a day (TID) | ORAL | Status: DC | PRN

## 2012-11-13 NOTE — Telephone Encounter (Signed)
  [name], MD 11/13/2012, 1:59 PM

## 2012-11-13 NOTE — Telephone Encounter (Signed)
Last filled 10/15/12

## 2012-12-06 ENCOUNTER — Ambulatory Visit: Payer: Medicare Other

## 2012-12-13 ENCOUNTER — Ambulatory Visit (INDEPENDENT_AMBULATORY_CARE_PROVIDER_SITE_OTHER): Payer: Medicare Other | Admitting: General Practice

## 2012-12-13 ENCOUNTER — Other Ambulatory Visit: Payer: Self-pay | Admitting: Family Medicine

## 2012-12-13 DIAGNOSIS — Z5181 Encounter for therapeutic drug level monitoring: Secondary | ICD-10-CM

## 2012-12-13 DIAGNOSIS — I4891 Unspecified atrial fibrillation: Secondary | ICD-10-CM

## 2012-12-13 DIAGNOSIS — Z7901 Long term (current) use of anticoagulants: Secondary | ICD-10-CM

## 2012-12-13 MED ORDER — WARFARIN SODIUM 10 MG PO TABS: 10.0000 mg | ORAL_TABLET | Freq: Every day | ORAL | Status: DC

## 2012-12-13 NOTE — Telephone Encounter (Signed)
Received fax refill request.

## 2013-01-10 ENCOUNTER — Ambulatory Visit: Payer: Medicare Other

## 2013-01-17 ENCOUNTER — Ambulatory Visit (INDEPENDENT_AMBULATORY_CARE_PROVIDER_SITE_OTHER): Payer: Medicare Other | Admitting: General Practice

## 2013-01-17 DIAGNOSIS — Z5181 Encounter for therapeutic drug level monitoring: Secondary | ICD-10-CM

## 2013-01-17 DIAGNOSIS — Z7901 Long term (current) use of anticoagulants: Secondary | ICD-10-CM

## 2013-01-17 DIAGNOSIS — I4891 Unspecified atrial fibrillation: Secondary | ICD-10-CM

## 2013-01-17 LAB — POCT INR: INR: 1.4

## 2013-01-24 ENCOUNTER — Ambulatory Visit (INDEPENDENT_AMBULATORY_CARE_PROVIDER_SITE_OTHER): Payer: Medicare Other | Admitting: Family Medicine

## 2013-01-24 DIAGNOSIS — Z5181 Encounter for therapeutic drug level monitoring: Secondary | ICD-10-CM

## 2013-01-24 DIAGNOSIS — Z7901 Long term (current) use of anticoagulants: Secondary | ICD-10-CM

## 2013-01-24 DIAGNOSIS — I4891 Unspecified atrial fibrillation: Secondary | ICD-10-CM

## 2013-01-24 LAB — POCT INR: INR: 2.1

## 2013-02-21 ENCOUNTER — Ambulatory Visit (INDEPENDENT_AMBULATORY_CARE_PROVIDER_SITE_OTHER): Payer: Medicare Other | Admitting: Family Medicine

## 2013-02-21 DIAGNOSIS — I4891 Unspecified atrial fibrillation: Secondary | ICD-10-CM

## 2013-02-21 DIAGNOSIS — Z5181 Encounter for therapeutic drug level monitoring: Secondary | ICD-10-CM

## 2013-02-21 DIAGNOSIS — Z7901 Long term (current) use of anticoagulants: Secondary | ICD-10-CM

## 2013-02-22 ENCOUNTER — Emergency Department: Payer: Self-pay | Admitting: Emergency Medicine

## 2013-02-22 LAB — COMPREHENSIVE METABOLIC PANEL
Albumin: 4 g/dL (ref 3.4–5.0)
Alkaline Phosphatase: 73 U/L (ref 50–136)
BUN: 7 mg/dL (ref 7–18)
Bilirubin,Total: 0.4 mg/dL (ref 0.2–1.0)
Chloride: 107 mmol/L (ref 98–107)
Co2: 25 mmol/L (ref 21–32)
EGFR (African American): >60
EGFR (Non-African Amer.): >60
Glucose: 104 mg/dL — ABNORMAL HIGH (ref 65–99)
Potassium: 4.4 mmol/L (ref 3.5–5.1)
SGOT(AST): 25 U/L (ref 15–37)
SGPT (ALT): 40 U/L (ref 12–78)
Sodium: 139 mmol/L (ref 136–145)

## 2013-02-22 LAB — CBC
HGB: 14 g/dL (ref 12.0–16.0)
MCHC: 34.4 g/dL (ref 32.0–36.0)
MCV: 97 fL (ref 80–100)
Platelet: 205 10*3/uL (ref 150–440)
RBC: 4.2 10*6/uL (ref 3.80–5.20)

## 2013-02-22 LAB — PROTIME-INR: INR: 1.6

## 2013-02-25 ENCOUNTER — Ambulatory Visit: Payer: Self-pay | Admitting: Orthopedic Surgery

## 2013-03-18 ENCOUNTER — Other Ambulatory Visit: Payer: Self-pay | Admitting: *Deleted

## 2013-03-18 MED ORDER — ONDANSETRON 4 MG PO TBDP: 4.0000 mg | ORAL_TABLET | Freq: Three times a day (TID) | ORAL | Status: DC | PRN

## 2013-03-18 NOTE — Telephone Encounter (Signed)
Ok to refill 

## 2013-03-21 ENCOUNTER — Ambulatory Visit: Payer: Medicare Other

## 2013-03-25 ENCOUNTER — Other Ambulatory Visit: Payer: Self-pay | Admitting: *Deleted

## 2013-03-25 ENCOUNTER — Ambulatory Visit (INDEPENDENT_AMBULATORY_CARE_PROVIDER_SITE_OTHER): Payer: Medicare Other | Admitting: Family Medicine

## 2013-03-25 DIAGNOSIS — Z7901 Long term (current) use of anticoagulants: Secondary | ICD-10-CM

## 2013-03-25 DIAGNOSIS — Z5181 Encounter for therapeutic drug level monitoring: Secondary | ICD-10-CM

## 2013-03-25 DIAGNOSIS — I4891 Unspecified atrial fibrillation: Secondary | ICD-10-CM

## 2013-03-25 MED ORDER — BUTALBITAL-APAP-CAFF-COD 50-325-40-30 MG PO CAPS: 1.0000 | ORAL_CAPSULE | ORAL | Status: DC | PRN

## 2013-03-25 NOTE — Telephone Encounter (Signed)
Ok to refill 30, 2 refills 

## 2013-03-25 NOTE — Telephone Encounter (Signed)
Last filled 02/04/13

## 2013-03-25 NOTE — Telephone Encounter (Signed)
rx called to pharmacy 

## 2013-04-19 ENCOUNTER — Other Ambulatory Visit: Payer: Self-pay

## 2013-04-19 ENCOUNTER — Other Ambulatory Visit: Payer: Self-pay | Admitting: Family Medicine

## 2013-04-19 MED ORDER — WARFARIN SODIUM 5 MG PO TABS: 5.0000 mg | ORAL_TABLET | Freq: Every day | ORAL | Status: DC

## 2013-04-19 MED ORDER — URIBEL 118 MG PO CAPS: 118.0000 mg | ORAL_CAPSULE | ORAL | Status: DC | PRN

## 2013-04-19 MED ORDER — WARFARIN SODIUM 10 MG PO TABS: 10.0000 mg | ORAL_TABLET | Freq: Every day | ORAL | Status: DC

## 2013-04-19 NOTE — Telephone Encounter (Signed)
[facility] pharmacy called to verify OK for pt to get Uribel with instructions take qid prn and verify quantity of warfarin 5 mg, should it be # 30 instead of #45. Carlena Sax RN team lead said OK to add Uribel instructions and change warfarin quantity to # 30. Elly Modena advised.

## 2013-04-22 ENCOUNTER — Ambulatory Visit (INDEPENDENT_AMBULATORY_CARE_PROVIDER_SITE_OTHER): Payer: Medicare Other | Admitting: Family Medicine

## 2013-04-22 DIAGNOSIS — I4891 Unspecified atrial fibrillation: Secondary | ICD-10-CM

## 2013-04-22 DIAGNOSIS — Z5181 Encounter for therapeutic drug level monitoring: Secondary | ICD-10-CM

## 2013-04-22 DIAGNOSIS — Z7901 Long term (current) use of anticoagulants: Secondary | ICD-10-CM

## 2013-05-02 ENCOUNTER — Other Ambulatory Visit: Payer: Self-pay

## 2013-05-02 MED ORDER — METOPROLOL SUCCINATE ER 25 MG PO TB24: 25.0000 mg | ORAL_TABLET | Freq: Every day | ORAL | Status: DC

## 2013-05-08 ENCOUNTER — Other Ambulatory Visit: Payer: Self-pay | Admitting: Family Medicine

## 2013-05-08 MED ORDER — LEVOTHYROXINE SODIUM 50 MCG PO TABS: 50.0000 ug | ORAL_TABLET | Freq: Every day | ORAL | Status: DC

## 2013-05-08 NOTE — Telephone Encounter (Signed)
Ok per Dr. Patsy Lager

## 2013-05-14 ENCOUNTER — Telehealth: Payer: Self-pay

## 2013-05-14 NOTE — Telephone Encounter (Signed)
[name] with [facility] health education dept. Left v/m requesting cb. Spoke with Geralyn at [facility] and pt fx distal radius 01/2013; this fx placed pt in [facility] Osteoporosis Program. Godwin wants to know if pt has ever had a bone density test or is pt on osteoporosis medication.Please advise.

## 2013-05-15 NOTE — Telephone Encounter (Signed)
Spoke with Vernona Rieger at [facility].  Advised no dexa in the last two years.  Dr. Patsy Lager will order one at next office visit.  Vernona Rieger just ask that when the patient comes in for her next office visit, that the doctor spends some time educating the patient on osteoporosis and prevention given her recent fracture.

## 2013-05-15 NOTE — Telephone Encounter (Signed)
No dexa in last 2 years - will get one at next [facility]

## 2013-05-20 ENCOUNTER — Ambulatory Visit: Payer: Medicare Other

## 2013-05-20 ENCOUNTER — Ambulatory Visit (INDEPENDENT_AMBULATORY_CARE_PROVIDER_SITE_OTHER): Payer: Medicare Other | Admitting: Family Medicine

## 2013-05-20 DIAGNOSIS — I4891 Unspecified atrial fibrillation: Secondary | ICD-10-CM

## 2013-05-20 DIAGNOSIS — Z5181 Encounter for therapeutic drug level monitoring: Secondary | ICD-10-CM

## 2013-05-20 DIAGNOSIS — Z7901 Long term (current) use of anticoagulants: Secondary | ICD-10-CM

## 2013-05-30 ENCOUNTER — Other Ambulatory Visit: Payer: Self-pay | Admitting: *Deleted

## 2013-05-30 MED ORDER — WARFARIN SODIUM 3 MG PO TABS: 3.0000 mg | ORAL_TABLET | ORAL | Status: DC

## 2013-06-11 ENCOUNTER — Other Ambulatory Visit: Payer: Self-pay | Admitting: *Deleted

## 2013-06-11 NOTE — Telephone Encounter (Signed)
Last office visit 09/10/2012.  Ok to refill?

## 2013-06-12 MED ORDER — BUTALBITAL-APAP-CAFF-COD 50-325-40-30 MG PO CAPS: 1.0000 | ORAL_CAPSULE | ORAL | Status: DC | PRN

## 2013-06-12 NOTE — Telephone Encounter (Signed)
Called to [facility]. 

## 2013-06-12 NOTE — Telephone Encounter (Signed)
yes

## 2013-06-17 ENCOUNTER — Ambulatory Visit (INDEPENDENT_AMBULATORY_CARE_PROVIDER_SITE_OTHER): Payer: Medicare Other | Admitting: Family Medicine

## 2013-06-17 ENCOUNTER — Other Ambulatory Visit: Payer: Self-pay

## 2013-06-17 DIAGNOSIS — Z5181 Encounter for therapeutic drug level monitoring: Secondary | ICD-10-CM

## 2013-06-17 DIAGNOSIS — Z7901 Long term (current) use of anticoagulants: Secondary | ICD-10-CM

## 2013-06-17 DIAGNOSIS — I4891 Unspecified atrial fibrillation: Secondary | ICD-10-CM

## 2013-06-17 NOTE — Telephone Encounter (Signed)
Pt left note requesting refill clonazepam 1 mg and mirtazapine 45 mg taking 1 tab at bedtime called to [facility].Pts med list has mirtazapine 45 mg taking 1/2 at bedtime. Pt said she is taking Mirtazapine 45 mg taking one tab at hs.Please advise. Pt request cb when rx called to pharmacy.

## 2013-06-17 NOTE — Telephone Encounter (Signed)
Klonopin, #60, 2 refills Remeron, #30, 5 refills  Ok to take a full 1 tablet of remeron at night

## 2013-06-18 MED ORDER — CLONAZEPAM 1 MG PO TABS: 1.0000 mg | ORAL_TABLET | Freq: Two times a day (BID) | ORAL | Status: DC | PRN

## 2013-06-18 MED ORDER — MIRTAZAPINE 45 MG PO TABS: 45.0000 mg | ORAL_TABLET | Freq: Every day | ORAL | Status: DC

## 2013-06-18 MED ORDER — MIRTAZAPINE 45 MG PO TABS: 30.0000 mg | ORAL_TABLET | Freq: Every day | ORAL | Status: DC

## 2013-06-18 NOTE — Telephone Encounter (Signed)
Klonopin called to [facility].

## 2013-06-18 NOTE — Telephone Encounter (Signed)
Patient notified prescriptions have been sent to [facility].  Also advised ok to take one tablet of Remeron at night per Dr. Patsy Lager.

## 2013-07-16 ENCOUNTER — Ambulatory Visit: Payer: Medicare Other

## 2013-07-22 ENCOUNTER — Other Ambulatory Visit: Payer: Self-pay | Admitting: Family Medicine

## 2013-07-24 ENCOUNTER — Ambulatory Visit: Payer: Medicare Other

## 2013-07-29 ENCOUNTER — Ambulatory Visit (INDEPENDENT_AMBULATORY_CARE_PROVIDER_SITE_OTHER): Payer: Medicare Other

## 2013-07-29 ENCOUNTER — Ambulatory Visit (INDEPENDENT_AMBULATORY_CARE_PROVIDER_SITE_OTHER): Payer: Medicare Other | Admitting: Family Medicine

## 2013-07-29 DIAGNOSIS — Z5181 Encounter for therapeutic drug level monitoring: Secondary | ICD-10-CM

## 2013-07-29 DIAGNOSIS — Z7901 Long term (current) use of anticoagulants: Secondary | ICD-10-CM

## 2013-07-29 DIAGNOSIS — Z23 Encounter for immunization: Secondary | ICD-10-CM

## 2013-07-29 DIAGNOSIS — I4891 Unspecified atrial fibrillation: Secondary | ICD-10-CM

## 2013-08-16 ENCOUNTER — Other Ambulatory Visit: Payer: Self-pay | Admitting: Family Medicine

## 2013-09-09 ENCOUNTER — Ambulatory Visit: Payer: Medicare Other

## 2013-09-12 ENCOUNTER — Ambulatory Visit: Payer: Medicare Other

## 2013-09-16 ENCOUNTER — Ambulatory Visit (INDEPENDENT_AMBULATORY_CARE_PROVIDER_SITE_OTHER): Payer: Medicare Other | Admitting: Family Medicine

## 2013-09-16 DIAGNOSIS — I4891 Unspecified atrial fibrillation: Secondary | ICD-10-CM

## 2013-09-16 DIAGNOSIS — Z7901 Long term (current) use of anticoagulants: Secondary | ICD-10-CM

## 2013-09-16 DIAGNOSIS — Z5181 Encounter for therapeutic drug level monitoring: Secondary | ICD-10-CM

## 2013-09-16 LAB — POCT INR: INR: >8

## 2013-09-16 LAB — PROTIME-INR
INR: 10.1 ratio (ref 0.8–1.0)
Prothrombin Time: 102.2 s (ref 10.2–12.4)

## 2013-09-16 MED ORDER — PHYTONADIONE 5 MG PO TABS: ORAL_TABLET | ORAL | Status: DC

## 2013-09-16 NOTE — Patient Instructions (Signed)
Critical results called to Cindy,RN at [facility] 4.41pm.

## 2013-09-19 ENCOUNTER — Other Ambulatory Visit: Payer: Self-pay

## 2013-09-19 ENCOUNTER — Ambulatory Visit (INDEPENDENT_AMBULATORY_CARE_PROVIDER_SITE_OTHER): Payer: Medicare Other | Admitting: Family Medicine

## 2013-09-19 DIAGNOSIS — Z7901 Long term (current) use of anticoagulants: Secondary | ICD-10-CM

## 2013-09-19 DIAGNOSIS — I4891 Unspecified atrial fibrillation: Secondary | ICD-10-CM

## 2013-09-19 DIAGNOSIS — Z5181 Encounter for therapeutic drug level monitoring: Secondary | ICD-10-CM

## 2013-09-19 LAB — POCT INR: INR: 1.3

## 2013-09-19 MED ORDER — CLONAZEPAM 1 MG PO TABS: 1.0000 mg | ORAL_TABLET | Freq: Two times a day (BID) | ORAL | Status: DC | PRN

## 2013-09-19 NOTE — Telephone Encounter (Signed)
Pt request refill clonazepam to [place] [facility]. Pt is out of medication and request cb when refilled.

## 2013-09-19 NOTE — Telephone Encounter (Signed)
Ok to refill #60, 3 refills 

## 2013-09-19 NOTE — Telephone Encounter (Signed)
Called to [facility].

## 2013-09-19 NOTE — Telephone Encounter (Signed)
Ms. Maharaj notified prescription has been called to her pharmacy.

## 2013-10-01 ENCOUNTER — Telehealth: Payer: Self-pay | Admitting: Family Medicine

## 2013-10-01 DIAGNOSIS — H9190 Unspecified hearing loss, unspecified ear: Secondary | ICD-10-CM

## 2013-10-01 NOTE — Telephone Encounter (Signed)
Pt left voicemail with triage. Pt said she is in need of a hearing test due to loss of hearing. [facility] said that pt needs a referral to them before she can schedule appt. Pt wants to know if she needs appt with you before you refer her, pt request call back  Route to Dr. Diona Browner also since Dr. Lorelei Pont out of office

## 2013-10-01 NOTE — Telephone Encounter (Signed)
Referral sent for ENT, no appt needed.

## 2013-10-02 NOTE — Telephone Encounter (Signed)
Mrs. Weisheit notified ENT referral has been ordered and [name] will be calling her once she gets the appointment scheduled.  Patient request not to be scheduled on 10/10/2013, 10/15/2013 or 10/17/2013 due to having other appointments already on those days.

## 2013-10-10 ENCOUNTER — Ambulatory Visit (INDEPENDENT_AMBULATORY_CARE_PROVIDER_SITE_OTHER): Payer: Medicare Other | Admitting: Family Medicine

## 2013-10-10 ENCOUNTER — Ambulatory Visit: Payer: Medicare Other

## 2013-10-10 DIAGNOSIS — I4891 Unspecified atrial fibrillation: Secondary | ICD-10-CM

## 2013-10-10 DIAGNOSIS — Z7901 Long term (current) use of anticoagulants: Secondary | ICD-10-CM

## 2013-10-10 DIAGNOSIS — Z5181 Encounter for therapeutic drug level monitoring: Secondary | ICD-10-CM

## 2013-10-10 LAB — POCT INR: INR: 6.3

## 2013-10-14 ENCOUNTER — Ambulatory Visit (INDEPENDENT_AMBULATORY_CARE_PROVIDER_SITE_OTHER): Payer: Medicare Other | Admitting: Family Medicine

## 2013-10-14 DIAGNOSIS — I4891 Unspecified atrial fibrillation: Secondary | ICD-10-CM

## 2013-10-14 DIAGNOSIS — Z7901 Long term (current) use of anticoagulants: Secondary | ICD-10-CM

## 2013-10-14 DIAGNOSIS — Z5181 Encounter for therapeutic drug level monitoring: Secondary | ICD-10-CM

## 2013-10-14 LAB — POCT INR: INR: 1.3

## 2013-10-25 ENCOUNTER — Other Ambulatory Visit: Payer: Self-pay | Admitting: Family Medicine

## 2013-10-28 ENCOUNTER — Other Ambulatory Visit: Payer: Self-pay | Admitting: Family Medicine

## 2013-10-28 MED ORDER — WARFARIN SODIUM 3 MG PO TABS: 3.0000 mg | ORAL_TABLET | ORAL | Status: DC

## 2013-10-29 ENCOUNTER — Other Ambulatory Visit: Payer: Self-pay | Admitting: Family Medicine

## 2013-10-29 MED ORDER — WARFARIN SODIUM 5 MG PO TABS: ORAL_TABLET | ORAL | Status: DC

## 2013-11-08 ENCOUNTER — Other Ambulatory Visit: Payer: Self-pay | Admitting: Family Medicine

## 2013-11-25 ENCOUNTER — Ambulatory Visit (INDEPENDENT_AMBULATORY_CARE_PROVIDER_SITE_OTHER): Payer: Medicare Other | Admitting: Family Medicine

## 2013-11-25 ENCOUNTER — Telehealth: Payer: Self-pay | Admitting: Family Medicine

## 2013-11-25 DIAGNOSIS — Z5181 Encounter for therapeutic drug level monitoring: Secondary | ICD-10-CM

## 2013-11-25 DIAGNOSIS — Z7901 Long term (current) use of anticoagulants: Secondary | ICD-10-CM

## 2013-11-25 DIAGNOSIS — I4891 Unspecified atrial fibrillation: Secondary | ICD-10-CM

## 2013-11-25 LAB — PROTIME-INR
INR: 15.8 ratio — AB (ref 0.8–1.0)
PROTHROMBIN TIME: 159 s — AB (ref 10.2–12.4)

## 2013-11-25 MED ORDER — PHYTONADIONE 5 MG PO TABS: 2.5000 mg | ORAL_TABLET | Freq: Once | ORAL | Status: DC

## 2013-11-25 NOTE — Telephone Encounter (Signed)
I spoke with patient on the phone. She is holding coumadin. 2 bruises, but she feels ok. No other symptoms. I am going to have her follow-up in the office on Thursday for face to face encounter.

## 2013-11-25 NOTE — Telephone Encounter (Signed)
Vit K RX sent to [facility] signed with Lamar Blinks, MD signature instead of Owens Loffler, MD by accident.  Elisabeth Pigeon and verified with pharmacist that ordering MD is Owens Loffler, MD.

## 2013-11-25 NOTE — Telephone Encounter (Signed)
Called pt to tell her results of INR and that we needed for her to go to pharmacy to pick up Vit K that has been called in for her.  Her husband continuously yelled in the background as I was trying to give her instructions.  She then handed him the phone and continued to yell at me and say "why can't you get this under control".  I tried to explain to him that pt's levels were always fluctuating high and low so she must not be taking the medication correctly.  He said he was going to speak with the doctor and hung up on me.  I discussed this issue with Dr. Lorelei Pont who stated he would call pt later this evening.  [name] called back and I told her that it was very important that she get the Vit K and take it now and hold Coumadin until we recheck her.  She said that her husband is having surgery so she'll have to find a ride and will have to call back to schedule appt time.  I informed her that it was extremely important that she come back to be checked. She did agree to take Vit K and hold Coumadin.  Her husband once again got on the phone and I told him that Dr. Lorelei Pont would be calling to speak to pt later on.

## 2013-11-26 ENCOUNTER — Telehealth: Payer: Self-pay | Admitting: *Deleted

## 2013-11-26 NOTE — Telephone Encounter (Signed)
[name] left voicemail stating Dr. Lorelei Pont stopped her coumadin and some other medications yesterday but she was calling to make sure it was okay if she took her clonazepam for her nerves.  Per Dr. Lorelei Pont the only medication that was stopped yesterday was her coumadin.  [place] for patient to continue with her clonazepam.  Patient notified by telephone that it is okay to continue her clonazepam.  The other medications she was referring to that Dr. Lorelei Pont told her to stop was ibuprofen and tylenol.

## 2013-11-28 ENCOUNTER — Other Ambulatory Visit: Payer: Medicare Other

## 2013-11-28 ENCOUNTER — Ambulatory Visit: Payer: Medicare Other | Admitting: Family Medicine

## 2013-11-28 ENCOUNTER — Ambulatory Visit: Payer: Medicare Other

## 2013-11-28 ENCOUNTER — Ambulatory Visit (INDEPENDENT_AMBULATORY_CARE_PROVIDER_SITE_OTHER): Payer: Medicare Other | Admitting: Family Medicine

## 2013-11-28 ENCOUNTER — Encounter: Payer: Self-pay | Admitting: Family Medicine

## 2013-11-28 VITALS — BP 110/78 | HR 120 | Temp 97.8°F | Wt 138.0 lb

## 2013-11-28 DIAGNOSIS — I4891 Unspecified atrial fibrillation: Secondary | ICD-10-CM | POA: Insufficient documentation

## 2013-11-28 DIAGNOSIS — Z5181 Encounter for therapeutic drug level monitoring: Secondary | ICD-10-CM

## 2013-11-28 DIAGNOSIS — F331 Major depressive disorder, recurrent, moderate: Secondary | ICD-10-CM

## 2013-11-28 DIAGNOSIS — F411 Generalized anxiety disorder: Secondary | ICD-10-CM

## 2013-11-28 LAB — POCT INR: INR: 1.4

## 2013-11-28 MED ORDER — CLONAZEPAM 0.5 MG PO TABS: 0.5000 mg | ORAL_TABLET | Freq: Two times a day (BID) | ORAL | Status: DC | PRN

## 2013-11-28 MED ORDER — ESCITALOPRAM OXALATE 5 MG PO TABS: 5.0000 mg | ORAL_TABLET | Freq: Every day | ORAL | Status: DC

## 2013-11-28 NOTE — Patient Instructions (Signed)
MELATONIN 10 MG, 1 HOUR BEFORE BEDTIME.
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
## 2013-11-28 NOTE — Progress Notes (Signed)
Pre visit review using our clinic review tool, if applicable. No additional management support is needed unless otherwise documented below in the visit note. 

## 2013-12-02 ENCOUNTER — Telehealth: Payer: Self-pay

## 2013-12-02 ENCOUNTER — Other Ambulatory Visit: Payer: Self-pay | Admitting: Family Medicine

## 2013-12-02 NOTE — Telephone Encounter (Signed)
Long conversation, see Addendum.

## 2013-12-02 NOTE — Telephone Encounter (Signed)
Pt left v/m; pt seen 11/28/13 and Dr Lorelei Pont was going to speak with Dr Percival Spanish before starting pt on a new med. Pt request cb when med is sent to [facility].

## 2013-12-02 NOTE — Telephone Encounter (Signed)
i understand. I left him a message over easter, but i have not spoken to him. She should be taking aspirin 81 mg until told differently.

## 2013-12-03 ENCOUNTER — Telehealth: Payer: Self-pay | Admitting: Family Medicine

## 2013-12-03 MED ORDER — WARFARIN SODIUM 5 MG PO TABS: 5.0000 mg | ORAL_TABLET | Freq: Every day | ORAL | Status: DC

## 2013-12-03 NOTE — Telephone Encounter (Signed)
I have sent in Coumadin 5 mg, 1 tablet a day only for the patient. I want to restart lower than before.   STOP her daily aspirin.  I have made her an appointment for her Coumadin management at the [facility] Cardiology office in their Coumadin clinic in [place] on Wednesday, 12/11/2013, at 2:10 PM.  Please explain location, on [address], across from [facility].   I want to get the head of the Cardiology coumadin clinic to be involved now.  Owens Loffler, MD

## 2013-12-03 NOTE — Telephone Encounter (Signed)
Pt called and says she has changed her mind and does want to go back on Coumadin b/c the other med didn't work.  She asked me to let you know it's an emergent call. She says she spoke w/you yesterday and you were going to call her today.  She says she uses [facility] in [place]. Thank you.

## 2013-12-03 NOTE — Telephone Encounter (Signed)
I will addend her note once again. Multiple hours have been spent on this dispo and patient encounter alone.   I am going to have her restart her Coumadin at 5 mg daily, and then she will follow-up with the Coumadin clinic at the [facility] Cardiology office next week in [place].  Owens Loffler, MD

## 2013-12-03 NOTE — Telephone Encounter (Signed)
Patient notified as instructed by telephone.  [name] states understanding.  Phone number to [facility] Cardiology in [place] given.

## 2013-12-05 ENCOUNTER — Ambulatory Visit: Payer: Medicare Other

## 2013-12-11 ENCOUNTER — Ambulatory Visit (INDEPENDENT_AMBULATORY_CARE_PROVIDER_SITE_OTHER): Payer: Medicare Other

## 2013-12-11 DIAGNOSIS — Z5181 Encounter for therapeutic drug level monitoring: Secondary | ICD-10-CM

## 2013-12-11 DIAGNOSIS — I4891 Unspecified atrial fibrillation: Secondary | ICD-10-CM

## 2013-12-11 DIAGNOSIS — Z7901 Long term (current) use of anticoagulants: Secondary | ICD-10-CM

## 2013-12-11 LAB — POCT INR: INR: 2.1

## 2013-12-12 ENCOUNTER — Other Ambulatory Visit: Payer: Self-pay

## 2013-12-12 NOTE — Telephone Encounter (Signed)
Pt request refill on clonazepam;advised pt should have refills; spoke with Denice Paradise, pharmacist at [facility], pt has clonazepam on hold and pt can pick up med on 12/16/13. Pt advised and voiced understanding.

## 2013-12-17 ENCOUNTER — Ambulatory Visit (INDEPENDENT_AMBULATORY_CARE_PROVIDER_SITE_OTHER): Payer: Medicare Other | Admitting: *Deleted

## 2013-12-17 DIAGNOSIS — I4891 Unspecified atrial fibrillation: Secondary | ICD-10-CM

## 2013-12-17 DIAGNOSIS — Z7901 Long term (current) use of anticoagulants: Secondary | ICD-10-CM

## 2013-12-17 DIAGNOSIS — Z5181 Encounter for therapeutic drug level monitoring: Secondary | ICD-10-CM

## 2013-12-17 LAB — POCT INR: INR: 2.1

## 2014-01-08 ENCOUNTER — Telehealth: Payer: Self-pay
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
## 2014-01-08 NOTE — Telephone Encounter (Signed)
We will not be following her Coumadin at our office - complex issue.  I will discuss with my office tomorrow and with patient.   I may have to send off her INR to the lab tomorrow to [facility] or [facility] and manage myself tomorrow as a one time event.   I believe interactions with the patient and her family are well documented in the EHR.  Electronically Signed  By: Owens Loffler, MD On: 01/08/2014 11:40 AM

## 2014-01-08 NOTE — Telephone Encounter (Signed)
I will be glad to dose INR results and schedule f/u back here in [facility] office if you forward the results to me.  I knew given the previous interaction with your office and pt and pt's family you wished for [place] to assume monitoring and managing pt's Coumadin, pt was just confused about appt and where to follow.  If you will draw INR tomorrow at your office and forward results to me I will call pt if you will just explain to her we are now following.  Thanks

## 2014-01-09 ENCOUNTER — Telehealth: Payer: Self-pay | Admitting: Family Medicine

## 2014-01-09 ENCOUNTER — Encounter: Payer: Self-pay | Admitting: Internal Medicine

## 2014-01-09 ENCOUNTER — Ambulatory Visit (INDEPENDENT_AMBULATORY_CARE_PROVIDER_SITE_OTHER): Payer: Medicare Other | Admitting: Internal Medicine

## 2014-01-09 ENCOUNTER — Ambulatory Visit: Payer: Medicare Other | Admitting: Family Medicine

## 2014-01-09 VITALS — BP 108/74 | HR 123 | Temp 97.9°F | Wt 128.0 lb

## 2014-01-09 DIAGNOSIS — I4891 Unspecified atrial fibrillation: Secondary | ICD-10-CM

## 2014-01-09 DIAGNOSIS — Z5181 Encounter for therapeutic drug level monitoring: Secondary | ICD-10-CM

## 2014-01-09 DIAGNOSIS — R4182 Altered mental status, unspecified: Secondary | ICD-10-CM

## 2014-01-09 DIAGNOSIS — R82998 Other abnormal findings in urine: Secondary | ICD-10-CM

## 2014-01-09 DIAGNOSIS — Z7901 Long term (current) use of anticoagulants: Secondary | ICD-10-CM

## 2014-01-09 DIAGNOSIS — R8290 Unspecified abnormal findings in urine: Secondary | ICD-10-CM

## 2014-01-09 LAB — COMPREHENSIVE METABOLIC PANEL
ALT: 18 U/L (ref 0–35)
AST: 20 U/L (ref 0–37)
Albumin: 4.6 g/dL (ref 3.5–5.2)
Alkaline Phosphatase: 51 U/L (ref 39–117)
BILIRUBIN TOTAL: 1.5 mg/dL — AB (ref 0.2–1.2)
BUN: 9 mg/dL (ref 6–23)
CHLORIDE: 104 meq/L (ref 96–112)
CO2: 25 meq/L (ref 19–32)
Calcium: 9.8 mg/dL (ref 8.4–10.5)
Creatinine, Ser: 0.9 mg/dL (ref 0.4–1.2)
GFR: 65.93 mL/min (ref >60.00)
Glucose, Bld: 113 mg/dL — ABNORMAL HIGH (ref 70–99)
Potassium: 4.3 mEq/L (ref 3.5–5.1)
SODIUM: 138 meq/L (ref 135–145)
TOTAL PROTEIN: 7.3 g/dL (ref 6.0–8.3)

## 2014-01-09 LAB — CBC
HEMATOCRIT: 44.8 % (ref 36.0–46.0)
HEMOGLOBIN: 15.1 g/dL — AB (ref 12.0–15.0)
MCHC: 33.7 g/dL (ref 30.0–36.0)
MCV: 98.1 fl (ref 78.0–100.0)
Platelets: 211 10*3/uL (ref 150.0–400.0)
RBC: 4.57 Mil/uL (ref 3.87–5.11)
RDW: 12.7 % (ref 11.5–15.5)
WBC: 5.7 10*3/uL (ref 4.0–10.5)

## 2014-01-09 LAB — PROTIME-INR
INR: 3.2 ratio — ABNORMAL HIGH (ref 0.8–1.0)
Prothrombin Time: 34.2 s — ABNORMAL HIGH (ref 9.6–13.1)

## 2014-01-09 LAB — VITAMIN B12: Vitamin B-12: 243 pg/mL (ref 211–911)

## 2014-01-09 LAB — TSH: TSH: 0.49 u[IU]/mL (ref 0.35–4.50)

## 2014-01-09 LAB — RPR

## 2014-01-09 NOTE — Patient Instructions (Addendum)
Confusion °Confusion is the inability to think with your usual speed or clarity. Confusion may come on quickly or slowly over time. How quickly the confusion comes on depends on the cause. Confusion can be due to any number of causes. °CAUSES  °· Concussion, head injury, or head trauma. °· Seizures. °· Stroke. °· Fever. °· Senility. °· Heightened emotional states like rage or terror. °· Mental illness in which the person loses the ability to determine what is real and what is not (hallucinations). °· Infections. °· Toxic effects from alcohol, drugs, or prescription medicines. °· Dehydration and an imbalance of salts in the body (electrolytes). °· Lack of sleep. °· Low blood sugar (diabetes). °· Low levels of oxygen (for example from chronic lung disorders). °· Drug interactions or other medication side effects. °· Nutritional deficiencies, especially niacin, thiamine, vitamin C, or vitamin B. °· Sudden drop in body temperature (hypothermia). °· Illness in the elderly. Constipation can result in confusion. An elderly person who is hospitalized may become confused due to change in daily routine. °SYMPTOMS  °People often describe their thinking as cloudy or unclear when they are confused. Confusion can also include feeling disoriented. That means you are unaware of where or who you are. You may also not know what the date or time is. If confused, you may also have difficulty paying attention, remembering and making decisions. Some people also act aggressively when they are confused.  °DIAGNOSIS  °The medical evaluation of confusion may include: °· Blood and urine tests. °· X-rays. °· Brain and nervous system tests. °· Analyzing your brain waves (electroencphalogram or EEG). °· A special X-ray (MRI) of your head or other special studies. °Your physician will ask questions such as: °· Do you get days and nights mixed up? °· Are you awake during regular sleep times? °· Do you have trouble recognizing people? °· Do you  know where you are? °· Do you know the date and time? °· Does the confusion come and go? °· Is the confusion quickly getting worse? °· Has there been a recent illness? °· Has there been a recent head injury? °· Are you diabetic? °· Do you have a lung disorder? °· What medication are you taking? °· Have you taken drugs or alcohol? °TREATMENT  °An admission to the hospital may not be needed, but a confused person should not be left alone. Stay with a family member or friend until the confusion clears. Avoid alcohol, pain relievers or sedative drugs until you have fully recovered. Do not drive until your caregiver says it is okay. °HOME CARE INSTRUCTIONS °What family and friends can do: °· To find out if someone is confused ask him or her their name, age, and the date. If the person is unsure or answers incorrectly, he or she is confused. °· Always introduce yourself, no matter how well the person knows you. °· Often remind the person of his or her location. °· Place a calendar and clock near the confused person. °· Talk about current events and plans for the day. °· Try to keep the environment calm, quiet and peaceful. °· Make sure the patient keeps follow up appointments with their physician. °PREVENTION  °Ways to prevent confusion: °· Avoid alcohol. °· Eat a balanced diet. °· Get enough sleep. °· Do not become isolated. Spend time with other people and make plans for your days. °· Keep careful watch on your blood sugar levels if you are diabetic. °SEEK IMMEDIATE MEDICAL CARE IF:  °· You develop   severe headaches, repeated vomiting, seizures, blackouts or slurred speech. °· There is increasing confusion, weakness, numbness, restlessness or personality changes. °· You develop a loss of balance, have marked dizziness, feel uncoordinated or fall. °· You have delusions, hallucinations or develop severe anxiety. °· Your family members think you need to be rechecked. °Document Released: 09/22/2004 Document Revised:  11/07/2011 Document Reviewed: 05/20/2008 °Exit[employer]® Patient Information ©2014 [employer]. ° °

## 2014-01-09 NOTE — Addendum Note (Signed)
Addended by: Ellamae Sia on: 01/09/2014 03:53 PM   Modules accepted: Orders

## 2014-01-09 NOTE — Progress Notes (Signed)
Pre visit review using our clinic review tool, if applicable. No additional management support is needed unless otherwise documented below in the visit note. 

## 2014-01-09 NOTE — Telephone Encounter (Signed)
Spoke with pts husband and he said when pt seen 11/28/13 mentioned beginning with memory issues but [name] said memory much worse starting 01/08/14. Pt to see Webb Silversmith NP today at 2 pm and if condition changes or worsens prior to appt pt will go to ED. [name] voiced understanding.

## 2014-01-09 NOTE — Progress Notes (Addendum)
Subjective:    Patient ID: [name], female    DOB: 1946-03-21, 68 y.o.   MRN: [id number]  HPI  Pt presents to the clinic today with c/o worsening memory issues. This has been gradually occuring over the last few weeks but has gotten abruptly worse since yesterday. Her husband called to make the appt. She thinks she is here to have her coumadin checked and her annual exam. She reports she has not recently started or stopped any new medications. She has taken her daily meds today. She denies pain, chest pain, shortness of breath. She denies dysuria,   Review of Systems  Past Medical History  Diagnosis Date  . H/O: rheumatic fever     Childhood  . Mitral regurgitation     s/p MV repair  . CVA (cerebral infarction)     h/o superior cerebellar infarct  . Atrial fibrillation     s/p cardioversion  . Hypothyroid   . History of nephrolithiasis   . Asthma   . Anxiety   . Depression   . Headache(784.0)     Migraine  . Urinary incontinence   . Acne rosacea   . Diverticulosis   . Osteopenia     Current Outpatient Prescriptions  Medication Sig Dispense Refill  . clonazePAM (KLONOPIN) 0.5 MG tablet Take 1 tablet (0.5 mg total) by mouth 2 (two) times daily as needed for anxiety.  60 tablet  2  . levothyroxine (SYNTHROID, LEVOTHROID) 50 MCG tablet TAKE ONE TABLET BY MOUTH EVERY DAY  30 tablet  3  . meclizine (ANTIVERT) 25 MG tablet Take 25 mg by mouth as needed for dizziness.      . warfarin (COUMADIN) 5 MG tablet Take 1 tablet (5 mg total) by mouth daily.  30 tablet  3   No current facility-administered medications for this visit.    No Known Allergies  Family History  Problem Relation Age of Onset  . Diabetes Mother   . Coronary artery disease Mother   . Kidney disease Mother     History   Social History  . Marital Status: Married    Spouse Name: N/A    Number of Children: 2  . Years of Education: N/A   Occupational History  . Retired    Social History Main  Topics  . Smoking status: Never Smoker   . Smokeless tobacco: Not on file  . Alcohol Use: No  . Drug Use: No  . Sexual Activity: Not on file   Other Topics Concern  . Not on file   Social History Narrative  . No narrative on file     Constitutional: Denies fever, malaise, fatigue, headache or abrupt weight changes.  HEENT: Denies eye pain, eye redness, ear pain, ringing in the ears, wax buildup, runny nose, nasal congestion, bloody nose, or sore throat. Respiratory: Denies difficulty breathing, shortness of breath, cough or sputum production.   Cardiovascular: Denies chest pain, chest tightness, palpitations or swelling in the hands or feet.  Gastrointestinal: Denies abdominal pain, bloating, constipation, diarrhea or blood in the stool.  GU: Denies urgency, frequency, pain with urination, burning sensation, blood in urine, odor or discharge. Musculoskeletal: Denies decrease in range of motion, difficulty with gait, muscle pain or joint pain and swelling.  Skin: Denies redness, rashes, lesions or ulcercations.  Neurological: Denies dizziness, difficulty with memory, difficulty with speech or problems with balance and coordination.   No other specific complaints in a complete review of systems (except as listed in  HPI above).     Objective:   Physical Exam   BP 108/74  Pulse 123  Temp(Src) 97.9 F (36.6 C) (Oral)  Wt 128 lb (58.06 kg)  SpO2 98% Wt Readings from Last 3 Encounters:  01/09/14 128 lb (58.06 kg)  11/28/13 138 lb (62.596 kg)  09/10/12 157 lb (71.215 kg)    General: Appears her stated age, appears acutely confused, in NAD. HEENT: Head: normal shape and size; Eyes: sclera white, no icterus, conjunctiva pink, PERRLA and EOMs intact; Ears: Tm's gray and intact, normal light reflex; Nose: mucosa pink and moist, septum midline; Throat/Mouth: Teeth present, mucosa pink and moist, no exudate, lesions or ulcerations noted.   Cardiovascular: Normal rate and rhythm. S1,S2  noted.  No murmur, rubs or gallops noted. No JVD or BLE edema. No carotid bruits noted. Pulmonary/Chest: Normal effort and positive vesicular breath sounds. No respiratory distress. No wheezes, rales or ronchi noted.   Neurological: Alert but not oriented to person, place or time. She can not name the president. She does know her husbands name. Cranial nerves II-XII intact. Coordination normal. +DTRs bilaterally. Strength 5/5 bilaterally Psychiatric: Mood and affect normal. Behavior is abnormal. Judgment and thought content seemed delayed.     BMET    Component Value Date/Time   NA 138 09/10/2012 1616   K 4.9 09/10/2012 1616   CL 102 09/10/2012 1616   CO2 25 09/10/2012 1616   GLUCOSE 100* 09/10/2012 1616   BUN 16 09/10/2012 1616   CREATININE 1.06 09/10/2012 1616   CREATININE 0.9 05/04/2011 1026   CALCIUM 10.0 09/10/2012 1616   GFRNONAA 78.65 03/31/2010 0906    Lipid Panel     Component Value Date/Time   CHOL 190 05/04/2011 1026   TRIG 182.0* 05/04/2011 1026   HDL 47.00 05/04/2011 1026   CHOLHDL 4 05/04/2011 1026   VLDL 36.4 05/04/2011 1026   LDLCALC 107* 05/04/2011 1026    CBC    Component Value Date/Time   WBC 6.4 09/10/2012 1616   RBC 4.50 09/10/2012 1616   HGB 14.8 09/10/2012 1616   HCT 42.5 09/10/2012 1616   PLT 238 09/10/2012 1616   MCV 94.4 09/10/2012 1616   MCH 32.9 09/10/2012 1616   MCHC 34.8 09/10/2012 1616   RDW 13.7 09/10/2012 1616   LYMPHSABS 1.4 09/10/2012 1616   MONOABS 0.4 09/10/2012 1616   EOSABS 0.0 09/10/2012 1616   BASOSABS 0.0 09/10/2012 1616    Hgb A1C No results found for this basename: HGBA1C        Assessment & Plan:   Acute confusional state:  Will check stat labs Will obtain MRI brain- she does have risk factors for ischemic stroke Urinalysis normal Discussed with Dr. Lorelei Pont and he agrees with plan  Will follow up after labs/MRI is back

## 2014-01-09 NOTE — Telephone Encounter (Signed)
Needs eval.  If toxic, to ER.  O/w here.  Thanks.

## 2014-01-09 NOTE — Telephone Encounter (Signed)
Pt called to cancel her appt for today.  She said she was not feeling well and had been throwing up so she couldn't come.  Throughout our conversation it was apparent to me that she was very confused.  She was asking how to get here and [place] I tried to explain where we are located and she was getting frustrated because she didn't understand.  She also asked what my phone number here at [facility] was again.  Due to her confusion I tried to get her to talk with a nurse but she would not.

## 2014-01-09 NOTE — Telephone Encounter (Signed)
Spoke with pt and vomiting and loose BM started last night. Last vomited one hour ago,had H/A earlier but tylenol relieved H/A; pt feels weak. No dizziness, CP, SOB or loss of strength in hands or legs; no abd pain or fever. When asked who her PCP was pt said it had been so long since she had seen him she does not remember his name. Pt saw Dr Lorelei Pont 11/28/13. When asked if she knew where our office was she said she was told earlier it was across the street from [place]; pt already has appt 01/15/14 with Dr Lorelei Pont; but pt is markedly confused.

## 2014-01-09 NOTE — Telephone Encounter (Signed)
Thank-you so much, Erika. I really appreciate your help.

## 2014-01-10 ENCOUNTER — Ambulatory Visit (INDEPENDENT_AMBULATORY_CARE_PROVIDER_SITE_OTHER): Payer: Medicare Other | Admitting: Cardiology

## 2014-01-10 ENCOUNTER — Ambulatory Visit
Admission: RE | Admit: 2014-01-10 | Discharge: 2014-01-10 | Disposition: A | Discharge status: Home / Self Care | Payer: Medicare Other | Source: Ambulatory Visit | Attending: Internal Medicine | Admitting: Internal Medicine

## 2014-01-10 DIAGNOSIS — R4182 Altered mental status, unspecified: Secondary | ICD-10-CM

## 2014-01-10 DIAGNOSIS — I4891 Unspecified atrial fibrillation: Secondary | ICD-10-CM

## 2014-01-10 LAB — PRESCRIPTION ABUSE MONITORING 17P, URINE
Amphetamine/Meth: NEGATIVE ng/mL
BUPRENORPHINE, URINE: NEGATIVE ng/mL
Barbiturate Screen, Urine: NEGATIVE ng/mL
CANNABINOID SCRN UR: NEGATIVE ng/mL
Carisoprodol, Urine: NEGATIVE ng/mL
Cocaine Metabolites: NEGATIVE ng/mL
Creatinine, Urine: 60.39 mg/dL (ref >20.0)
Fentanyl, Ur: NEGATIVE ng/mL
MDMA URINE: NEGATIVE ng/mL
MEPERIDINE UR: NEGATIVE ng/mL
METHADONE SCREEN, URINE: NEGATIVE ng/mL
OPIATE SCREEN, URINE: NEGATIVE ng/mL
Oxycodone Screen, Ur: NEGATIVE ng/mL
Propoxyphene: NEGATIVE ng/mL
TAPENTADOLUR: NEGATIVE ng/mL
Tramadol Scrn, Ur: NEGATIVE ng/mL
ZOLPIDEM, URINE: NEGATIVE ng/mL

## 2014-01-10 LAB — HIV ANTIBODY (ROUTINE TESTING W REFLEX): HIV: NONREACTIVE

## 2014-01-13 ENCOUNTER — Ambulatory Visit: Payer: Medicare Other | Admitting: Internal Medicine

## 2014-01-14 LAB — BENZODIAZEPINES (GC/LC/MS), URINE
ALPRAZOLAMU: NEGATIVE ng/mL (ref <25)
Clonazepam metabolite (GC/LC/MS), ur confirm: 423 ng/mL — ABNORMAL HIGH (ref <25)
Flurazepam metabolite (GC/LC/MS), ur confirm: NEGATIVE ng/mL (ref <50)
Lorazepam (GC/LC/MS), ur confirm: NEGATIVE ng/mL (ref <50)
Midazolam (GC/LC/MS), ur confirm: NEGATIVE ng/mL (ref <50)
NORDIAZEPAMU: NEGATIVE ng/mL (ref <50)
OXAZEPAMU: NEGATIVE ng/mL (ref <50)
TEMAZEPAMU: NEGATIVE ng/mL (ref <50)
Triazolam metabolite (GC/LC/MS), ur confirm: NEGATIVE ng/mL (ref <50)

## 2014-01-15 ENCOUNTER — Encounter: Payer: Self-pay | Admitting: Family Medicine

## 2014-01-15 ENCOUNTER — Ambulatory Visit (INDEPENDENT_AMBULATORY_CARE_PROVIDER_SITE_OTHER): Payer: Medicare Other | Admitting: Family Medicine

## 2014-01-15 VITALS — BP 90/70 | HR 132 | Temp 98.0°F | Ht 68.0 in | Wt 133.2 lb

## 2014-01-15 DIAGNOSIS — R4182 Altered mental status, unspecified: Secondary | ICD-10-CM

## 2014-01-15 DIAGNOSIS — F411 Generalized anxiety disorder: Secondary | ICD-10-CM

## 2014-01-15 DIAGNOSIS — E785 Hyperlipidemia, unspecified: Secondary | ICD-10-CM

## 2014-01-15 DIAGNOSIS — F331 Major depressive disorder, recurrent, moderate: Secondary | ICD-10-CM

## 2014-01-15 DIAGNOSIS — E039 Hypothyroidism, unspecified: Secondary | ICD-10-CM

## 2014-01-15 DIAGNOSIS — I4891 Unspecified atrial fibrillation: Secondary | ICD-10-CM

## 2014-01-15 MED ORDER — HYDROXYZINE HCL 25 MG PO TABS: 25.0000 mg | ORAL_TABLET | Freq: Three times a day (TID) | ORAL | Status: DC | PRN
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
## 2014-01-15 NOTE — Patient Instructions (Signed)
Clonazepam 0.5 mg. 1 tablet twice a day for 1 week.  After 1 week, take 1/2 tablet in the morning and 1 tablet at night  After 1 more week, then 1/2 tablet in the morning and 1/2 tablet at night.   After 1 more week, then 1/2 tablet in the evening and NONE in the morning.  After 1 more week, then STOP completely.   It is ok to take a Vistaril tablet (Hydroxyzine)  In an emergency.

## 2014-01-15 NOTE — Progress Notes (Signed)
Pre visit review using our clinic review tool, if applicable. No additional management support is needed unless otherwise documented below in the visit note. 

## 2014-01-22 ENCOUNTER — Telehealth: Payer: Self-pay

## 2014-01-22 ENCOUNTER — Ambulatory Visit (INDEPENDENT_AMBULATORY_CARE_PROVIDER_SITE_OTHER): Payer: Medicare Other

## 2014-01-22 DIAGNOSIS — Z5181 Encounter for therapeutic drug level monitoring: Secondary | ICD-10-CM

## 2014-01-22 DIAGNOSIS — Z7901 Long term (current) use of anticoagulants: Secondary | ICD-10-CM

## 2014-01-22 DIAGNOSIS — I4891 Unspecified atrial fibrillation: Secondary | ICD-10-CM

## 2014-01-22 LAB — POCT INR: INR: 1.9

## 2014-01-22 NOTE — Telephone Encounter (Signed)
Pt called and has question regarding her coumadin. Please call.

## 2014-01-23 NOTE — Telephone Encounter (Signed)
Returned call to pt, pt wanted to clarify dosage instructions from yesterdays Coumadin clinic visit.  Pt states she only took 1 tablet yesterday, and has take 1 tablet today.  Advised pt to take an extra 1/2 tablet now, and then resume same dosage she has been taking 1 tablet daily.  Pt verbalized understanding.  Will keep scheduled f/u visit in 3 weeks.

## 2014-02-11 ENCOUNTER — Encounter: Payer: Self-pay | Admitting: Family Medicine

## 2014-02-12 ENCOUNTER — Ambulatory Visit (INDEPENDENT_AMBULATORY_CARE_PROVIDER_SITE_OTHER): Payer: Medicare Other

## 2014-02-12 ENCOUNTER — Encounter: Payer: Self-pay | Admitting: *Deleted

## 2014-02-12 DIAGNOSIS — Z5181 Encounter for therapeutic drug level monitoring: Secondary | ICD-10-CM

## 2014-02-12 DIAGNOSIS — I4891 Unspecified atrial fibrillation: Secondary | ICD-10-CM

## 2014-02-12 DIAGNOSIS — Z7901 Long term (current) use of anticoagulants: Secondary | ICD-10-CM

## 2014-02-12 LAB — POCT INR: INR: 1.6

## 2014-02-12 MED ORDER — WARFARIN SODIUM 5 MG PO TABS: ORAL_TABLET | ORAL | Status: DC

## 2014-02-12 MED ORDER — WARFARIN SODIUM 5 MG PO TABS: 5.0000 mg | ORAL_TABLET | Freq: Every day | ORAL | Status: DC

## 2014-02-25 ENCOUNTER — Telehealth: Payer: Self-pay | Admitting: Family Medicine

## 2014-02-25 NOTE — Telephone Encounter (Signed)
Error

## 2014-02-25 NOTE — Telephone Encounter (Signed)
Pt called and said she received a letter from the Urologist office in [place] where she gets her blood checked monthly that says they are moving to [facility] and wanted to know if we were going to make her go to [facility] every month to have it checked.  I informed pt that [facility] at [facility] is moving to [facility] but that is a separate location from [facility] where she goes to have her INR checked and that nothing was going to change with them.  She verbalized understanding.

## 2014-03-05 ENCOUNTER — Ambulatory Visit (INDEPENDENT_AMBULATORY_CARE_PROVIDER_SITE_OTHER): Payer: Medicare Other

## 2014-03-05 ENCOUNTER — Other Ambulatory Visit: Payer: Self-pay | Admitting: Family Medicine

## 2014-03-05 DIAGNOSIS — Z5181 Encounter for therapeutic drug level monitoring: Secondary | ICD-10-CM

## 2014-03-05 DIAGNOSIS — I4891 Unspecified atrial fibrillation: Secondary | ICD-10-CM

## 2014-03-05 DIAGNOSIS — Z7901 Long term (current) use of anticoagulants: Secondary | ICD-10-CM

## 2014-03-05 LAB — POCT INR: INR: 1.8

## 2014-03-05 NOTE — Telephone Encounter (Signed)
Ok to refill 30, 2 refills

## 2014-03-05 NOTE — Telephone Encounter (Signed)
Last office visit 01/15/2014.  Last refilled 01/15/2014 for #30 with 2 refills.  Ok to refill?

## 2014-03-13 NOTE — Telephone Encounter (Signed)
This encounter was created in error - please disregard.

## 2014-03-19 ENCOUNTER — Ambulatory Visit (INDEPENDENT_AMBULATORY_CARE_PROVIDER_SITE_OTHER): Payer: Medicare Other

## 2014-03-19 DIAGNOSIS — I4891 Unspecified atrial fibrillation: Secondary | ICD-10-CM

## 2014-03-19 DIAGNOSIS — Z5181 Encounter for therapeutic drug level monitoring: Secondary | ICD-10-CM

## 2014-03-19 DIAGNOSIS — Z7901 Long term (current) use of anticoagulants: Secondary | ICD-10-CM

## 2014-03-19 LAB — POCT INR: INR: 1.4

## 2014-04-02 ENCOUNTER — Ambulatory Visit (INDEPENDENT_AMBULATORY_CARE_PROVIDER_SITE_OTHER): Payer: Medicare Other

## 2014-04-02 ENCOUNTER — Other Ambulatory Visit: Payer: Self-pay | Admitting: Family Medicine

## 2014-04-02 DIAGNOSIS — Z5181 Encounter for therapeutic drug level monitoring: Secondary | ICD-10-CM

## 2014-04-02 DIAGNOSIS — I4891 Unspecified atrial fibrillation: Secondary | ICD-10-CM

## 2014-04-02 DIAGNOSIS — Z7901 Long term (current) use of anticoagulants: Secondary | ICD-10-CM

## 2014-04-02 LAB — POCT INR: INR: 1.3

## 2014-04-07 ENCOUNTER — Other Ambulatory Visit: Payer: Self-pay | Admitting: Family Medicine

## 2014-04-07 NOTE — Telephone Encounter (Signed)
Last office visit 01/15/2014.  Ok to refill?

## 2014-04-09 ENCOUNTER — Ambulatory Visit (INDEPENDENT_AMBULATORY_CARE_PROVIDER_SITE_OTHER): Payer: Medicare Other | Admitting: *Deleted

## 2014-04-09 DIAGNOSIS — Z5181 Encounter for therapeutic drug level monitoring: Secondary | ICD-10-CM

## 2014-04-09 DIAGNOSIS — Z7901 Long term (current) use of anticoagulants: Secondary | ICD-10-CM

## 2014-04-09 DIAGNOSIS — I4891 Unspecified atrial fibrillation: Secondary | ICD-10-CM

## 2014-04-09 LAB — POCT INR: INR: 1.8

## 2014-04-16 ENCOUNTER — Other Ambulatory Visit: Payer: Self-pay

## 2014-04-16 ENCOUNTER — Ambulatory Visit (INDEPENDENT_AMBULATORY_CARE_PROVIDER_SITE_OTHER): Payer: Medicare Other

## 2014-04-16 DIAGNOSIS — I4891 Unspecified atrial fibrillation: Secondary | ICD-10-CM

## 2014-04-16 DIAGNOSIS — Z5181 Encounter for therapeutic drug level monitoring: Secondary | ICD-10-CM

## 2014-04-16 DIAGNOSIS — Z7901 Long term (current) use of anticoagulants: Secondary | ICD-10-CM

## 2014-04-16 LAB — POCT INR: INR: 2.7

## 2014-04-16 NOTE — Telephone Encounter (Signed)
Levothyroxine was sent to [facility] on 04/02/2014 for #30 with 5 refills and Hydroxyzine was sent on 04/07/2014 for #30 with 3 refills.  Patient just needs to pick up prescriptions at pharmacy.

## 2014-04-16 NOTE — Telephone Encounter (Signed)
Pt seen in Coumadin Clinic today in [place], requesting refills on Levothyroxine 61mcg and Hydroxyzine 25mg  30 day supply to [facility], bottles states no refills left on present rx bottles.  Thanks

## 2014-05-07 ENCOUNTER — Ambulatory Visit (INDEPENDENT_AMBULATORY_CARE_PROVIDER_SITE_OTHER): Payer: Medicare Other

## 2014-05-07 DIAGNOSIS — Z5181 Encounter for therapeutic drug level monitoring: Secondary | ICD-10-CM

## 2014-05-07 DIAGNOSIS — Z7901 Long term (current) use of anticoagulants: Secondary | ICD-10-CM

## 2014-05-07 DIAGNOSIS — I4891 Unspecified atrial fibrillation: Secondary | ICD-10-CM

## 2014-05-07 LAB — POCT INR: INR: 3.5

## 2014-05-19 ENCOUNTER — Ambulatory Visit: Payer: Medicare Other

## 2014-05-28 ENCOUNTER — Ambulatory Visit (INDEPENDENT_AMBULATORY_CARE_PROVIDER_SITE_OTHER): Payer: Medicare Other

## 2014-05-28 ENCOUNTER — Other Ambulatory Visit: Payer: Self-pay

## 2014-05-28 DIAGNOSIS — I4891 Unspecified atrial fibrillation: Secondary | ICD-10-CM

## 2014-05-28 DIAGNOSIS — Z5181 Encounter for therapeutic drug level monitoring: Secondary | ICD-10-CM

## 2014-05-28 DIAGNOSIS — Z7901 Long term (current) use of anticoagulants: Secondary | ICD-10-CM

## 2014-05-28 LAB — POCT INR: INR: 3

## 2014-05-28 MED ORDER — HYDROXYZINE HCL 25 MG PO TABS: ORAL_TABLET | ORAL | Status: DC

## 2014-05-28 NOTE — Telephone Encounter (Signed)
Bethena Roys notified prescription has been sent to [facility]

## 2014-05-28 NOTE — Telephone Encounter (Signed)
Pt seen in [place] office Coumadin clinic, pt needs refill on Hydroxyzine.  Please send refill to [place].  Please call pt to notify rx sent to pharmacy. Thanks

## 2014-06-18 ENCOUNTER — Ambulatory Visit (INDEPENDENT_AMBULATORY_CARE_PROVIDER_SITE_OTHER): Payer: Medicare Other

## 2014-06-18 DIAGNOSIS — Z5181 Encounter for therapeutic drug level monitoring: Secondary | ICD-10-CM

## 2014-06-18 DIAGNOSIS — I4891 Unspecified atrial fibrillation: Secondary | ICD-10-CM

## 2014-06-18 DIAGNOSIS — Z7901 Long term (current) use of anticoagulants: Secondary | ICD-10-CM

## 2014-06-18 LAB — POCT INR: INR: 2.7

## 2014-07-02 ENCOUNTER — Other Ambulatory Visit: Payer: Self-pay | Admitting: *Deleted

## 2014-07-02 NOTE — Telephone Encounter (Signed)
Last office visit 01/15/2014.  Last refilled 05/28/2014 for #30 with 3 refills.  Ok to refill?

## 2014-07-02 NOTE — Telephone Encounter (Signed)
Denied. Last refilled 9/30 with 3 refills

## 2014-07-03 ENCOUNTER — Other Ambulatory Visit: Payer: Self-pay | Admitting: Family Medicine

## 2014-07-03 NOTE — Addendum Note (Signed)
Addended by: Carter Kitten on: 07/03/2014 02:22 PM   Modules accepted: Orders

## 2014-07-03 NOTE — Telephone Encounter (Addendum)
Spoke with Mrs. Mcgillis.  She is requesting refill on her hydroxyzine.  She states this instructions say to take three tablets daily as needed and   she is only taking one maybe two tablets daily.  Leaving to go out of town Friday morning as really needs this medication refilled.

## 2014-07-03 NOTE — Telephone Encounter (Signed)
Patient called about getting her medication refilled.  Please call patient.

## 2014-07-04 ENCOUNTER — Telehealth: Payer: Self-pay | Admitting: Family Medicine

## 2014-07-04 MED ORDER — HYDROXYZINE HCL 25 MG PO TABS: 25.0000 mg | ORAL_TABLET | Freq: Two times a day (BID) | ORAL | Status: DC | PRN

## 2014-07-04 NOTE — Telephone Encounter (Signed)
Mrs. Smarr notified prescription has been sent in to her pharmacy.

## 2014-07-04 NOTE — Addendum Note (Signed)
Addended by: Carter Kitten on: 07/04/2014 03:17 PM   Modules accepted: Orders

## 2014-07-04 NOTE — Telephone Encounter (Signed)
Change sig to say 1 tablet twice a day as needed for anxiety. #30, 2 refills  My expectation is that #30 tablets are to last at least 30 days.

## 2014-07-04 NOTE — Telephone Encounter (Signed)
error 

## 2014-07-04 NOTE — Telephone Encounter (Signed)
Pt left v/m; pt is out of hydroxyzine and pt takes med for anxiety. Pt request med refilled today to [facility]. Pt request cb.

## 2014-07-09 ENCOUNTER — Other Ambulatory Visit: Payer: Self-pay | Admitting: Family Medicine

## 2014-07-09 NOTE — Telephone Encounter (Signed)
See Pharmacist Note.

## 2014-07-16 ENCOUNTER — Ambulatory Visit (INDEPENDENT_AMBULATORY_CARE_PROVIDER_SITE_OTHER): Payer: Medicare Other

## 2014-07-16 DIAGNOSIS — Z5181 Encounter for therapeutic drug level monitoring: Secondary | ICD-10-CM

## 2014-07-16 DIAGNOSIS — I4891 Unspecified atrial fibrillation: Secondary | ICD-10-CM

## 2014-07-16 DIAGNOSIS — Z7901 Long term (current) use of anticoagulants: Secondary | ICD-10-CM

## 2014-07-16 LAB — POCT INR: INR: 2.8

## 2014-08-11 ENCOUNTER — Other Ambulatory Visit: Payer: Self-pay | Admitting: Family Medicine

## 2014-08-11 NOTE — Telephone Encounter (Signed)
Last office visit 01/15/2014.  Last refilled 07/04/2014 for #30 with 2 refills.  Ok to refill?

## 2014-08-11 NOTE — Telephone Encounter (Signed)
Ok, 30, 2 refill

## 2014-08-13 ENCOUNTER — Ambulatory Visit (INDEPENDENT_AMBULATORY_CARE_PROVIDER_SITE_OTHER): Payer: Medicare Other

## 2014-08-13 DIAGNOSIS — Z5181 Encounter for therapeutic drug level monitoring: Secondary | ICD-10-CM

## 2014-08-13 DIAGNOSIS — Z7901 Long term (current) use of anticoagulants: Secondary | ICD-10-CM

## 2014-08-13 DIAGNOSIS — I4891 Unspecified atrial fibrillation: Secondary | ICD-10-CM

## 2014-08-13 LAB — POCT INR: INR: 1.6

## 2014-09-03 ENCOUNTER — Ambulatory Visit (INDEPENDENT_AMBULATORY_CARE_PROVIDER_SITE_OTHER): Payer: Medicare Other

## 2014-09-03 DIAGNOSIS — Z5181 Encounter for therapeutic drug level monitoring: Secondary | ICD-10-CM

## 2014-09-03 DIAGNOSIS — I4891 Unspecified atrial fibrillation: Secondary | ICD-10-CM

## 2014-09-03 DIAGNOSIS — Z7901 Long term (current) use of anticoagulants: Secondary | ICD-10-CM

## 2014-09-03 LAB — POCT INR: INR: 2

## 2014-09-23 ENCOUNTER — Other Ambulatory Visit: Payer: Self-pay | Admitting: Family Medicine

## 2014-09-24 ENCOUNTER — Ambulatory Visit (INDEPENDENT_AMBULATORY_CARE_PROVIDER_SITE_OTHER): Payer: Medicare Other | Admitting: Pharmacist

## 2014-09-24 DIAGNOSIS — I4891 Unspecified atrial fibrillation: Secondary | ICD-10-CM

## 2014-09-24 DIAGNOSIS — Z7901 Long term (current) use of anticoagulants: Secondary | ICD-10-CM

## 2014-09-24 DIAGNOSIS — Z5181 Encounter for therapeutic drug level monitoring: Secondary | ICD-10-CM

## 2014-09-24 LAB — POCT INR: INR: 2.4

## 2014-09-24 NOTE — Telephone Encounter (Signed)
Last office visit 01/15/2014.  Last refilled 08/11/2014 for #30 with 2 refills.  Ok to refill?

## 2014-09-24 NOTE — Telephone Encounter (Signed)
Last refilled 08/11/2014 for #30 with 2 refills.  ??? She should not need additional refills. Denied.

## 2014-09-26 ENCOUNTER — Other Ambulatory Visit: Payer: Self-pay | Admitting: Family Medicine

## 2014-09-26 NOTE — Telephone Encounter (Signed)
Last office visit 01/15/2014.  Last refilled 08/11/2014 for #30 with 2 refills.  Ok to refill?

## 2014-09-26 NOTE — Telephone Encounter (Signed)
Pt left v/m requesting status of hydroxyzine to [facility]; pt said she requested refill beginning of this week; pt is out of med and pt request cb when refilled.

## 2014-09-28 NOTE — Telephone Encounter (Signed)
Please call.   This does not make sense.   On 08/11/2014, I gave her #30 with 2 refills.   She should not be out yet. She likely is trying to refill too early for her benefits to cover, but I gave her 2 refills just over a month ago.

## 2014-09-29 DIAGNOSIS — T460X1A Poisoning by cardiac-stimulant glycosides and drugs of similar action, accidental (unintentional), initial encounter: Secondary | ICD-10-CM

## 2014-09-29 DIAGNOSIS — I43 Cardiomyopathy in diseases classified elsewhere: Secondary | ICD-10-CM

## 2014-09-29 DIAGNOSIS — R000 Tachycardia, unspecified: Principal | ICD-10-CM

## 2014-09-29 DIAGNOSIS — I428 Other cardiomyopathies: Secondary | ICD-10-CM

## 2014-09-29 DIAGNOSIS — I4892 Unspecified atrial flutter: Secondary | ICD-10-CM

## 2014-09-29 DIAGNOSIS — I5042 Chronic combined systolic (congestive) and diastolic (congestive) heart failure: Secondary | ICD-10-CM

## 2014-09-29 DIAGNOSIS — I4891 Unspecified atrial fibrillation: Secondary | ICD-10-CM | POA: Insufficient documentation

## 2014-09-29 HISTORY — DX: Cardiomyopathy in diseases classified elsewhere: I43

## 2014-09-29 HISTORY — DX: Unspecified atrial fibrillation: I48.91

## 2014-09-29 HISTORY — DX: Other cardiomyopathies: I42.8

## 2014-09-29 HISTORY — DX: Poisoning by cardiac-stimulant glycosides and drugs of similar action, accidental (unintentional), initial encounter: T46.0X1A

## 2014-09-29 HISTORY — DX: Chronic combined systolic (congestive) and diastolic (congestive) heart failure: I50.42

## 2014-09-29 NOTE — Telephone Encounter (Signed)
Called and spoke with [name].  She seemed really clueless and couldn't really answer my questions about her hydroxyzine refills.  She thought I was telling her she had an appointment today.  I finally told her I would just call the pharmacy. Spoke with pharmacist.  He states they have always had an issue with [name].  He states the way the prescription is written it is only a 15 day supply.  She filled the prescription on 08/15/2014, 08/28/2014 & 09/11/2013.  He states she is usually requesting the refill before the 15 days are up and he makes her wait the full 15 days before refilling.  He also mentioned that she will also buy benadryl and sleep aides.  He states he tried to explain to her that the benadryl and sleep aide is in the same family as the hydroxyzine but she tells him she has to have it.  He states she will buy at box of 24 tablet sleep aides every week.  He thinks she feels she always has to be taking something.

## 2014-09-29 NOTE — Telephone Encounter (Signed)
Appointment scheduled 10/02/2014 at 12:00pm.  Advised Dr. Lorelei Pont will need to see her before any medication will be prescribed.  I also ask that her husband accompany her to this appointment.

## 2014-09-29 NOTE — Telephone Encounter (Signed)
F/u 30 minute office visit with me. All refills denied. I would like her to bring her husband, also.

## 2014-09-30 ENCOUNTER — Inpatient Hospital Stay: Payer: Self-pay | Admitting: Internal Medicine

## 2014-09-30 DIAGNOSIS — I340 Nonrheumatic mitral (valve) insufficiency: Secondary | ICD-10-CM

## 2014-09-30 LAB — CK-MB: CK-MB: 5.3 ng/mL — ABNORMAL HIGH (ref 0.5–3.6)

## 2014-09-30 LAB — TROPONIN I
TROPONIN-I: 0.04 ng/mL
Troponin-I: 0.04 ng/mL

## 2014-09-30 LAB — CBC
HCT: 40.4 % (ref 35.0–47.0)
HGB: 13.5 g/dL (ref 12.0–16.0)
MCH: 33.4 pg (ref 26.0–34.0)
MCHC: 33.5 g/dL (ref 32.0–36.0)
MCV: 100 fL (ref 80–100)
Platelet: 208 10*3/uL (ref 150–440)
RBC: 4.05 10*6/uL (ref 3.80–5.20)
RDW: 14.7 % — ABNORMAL HIGH (ref 11.5–14.5)
WBC: 8 10*3/uL (ref 3.6–11.0)

## 2014-09-30 LAB — LIPID PANEL
Cholesterol: 123 mg/dL (ref 0–200)
HDL Cholesterol: 53 mg/dL (ref 40–60)
LDL CHOLESTEROL, CALC: 51 mg/dL (ref 0–100)
Triglycerides: 93 mg/dL (ref 0–200)
VLDL CHOLESTEROL, CALC: 19 mg/dL (ref 5–40)

## 2014-09-30 LAB — BASIC METABOLIC PANEL
Anion Gap: 13 (ref 7–16)
BUN: 10 mg/dL (ref 7–18)
CHLORIDE: 83 mmol/L — AB (ref 98–107)
CREATININE: 0.92 mg/dL (ref 0.60–1.30)
Calcium, Total: 8 mg/dL — ABNORMAL LOW (ref 8.5–10.1)
Co2: 19 mmol/L — ABNORMAL LOW (ref 21–32)
Glucose: 146 mg/dL — ABNORMAL HIGH (ref 65–99)
Osmolality: 235 (ref 275–301)
Potassium: 4.2 mmol/L (ref 3.5–5.1)
Sodium: 115 mmol/L — CL (ref 136–145)

## 2014-09-30 LAB — PROTIME-INR
INR: 3.6
Prothrombin Time: 35.5 secs — ABNORMAL HIGH

## 2014-09-30 LAB — PRO B NATRIURETIC PEPTIDE: B-Type Natriuretic Peptide: 11829 pg/mL — ABNORMAL HIGH (ref 0–125)

## 2014-09-30 LAB — MAGNESIUM: MAGNESIUM: 1.8 mg/dL

## 2014-10-01 DIAGNOSIS — R1310 Dysphagia, unspecified: Secondary | ICD-10-CM

## 2014-10-01 DIAGNOSIS — R0600 Dyspnea, unspecified: Secondary | ICD-10-CM

## 2014-10-01 DIAGNOSIS — E871 Hypo-osmolality and hyponatremia: Secondary | ICD-10-CM

## 2014-10-01 DIAGNOSIS — I4891 Unspecified atrial fibrillation: Secondary | ICD-10-CM

## 2014-10-01 LAB — BASIC METABOLIC PANEL
ANION GAP: 10 (ref 7–16)
BUN: 11 mg/dL (ref 7–18)
CALCIUM: 7.8 mg/dL — AB (ref 8.5–10.1)
CREATININE: 0.84 mg/dL (ref 0.60–1.30)
Chloride: 85 mmol/L — ABNORMAL LOW (ref 98–107)
Co2: 21 mmol/L (ref 21–32)
EGFR (African American): >60
EGFR (Non-African Amer.): >60
Glucose: 121 mg/dL — ABNORMAL HIGH (ref 65–99)
Osmolality: 235 (ref 275–301)
Potassium: 4.3 mmol/L (ref 3.5–5.1)
Sodium: 116 mmol/L — CL (ref 136–145)

## 2014-10-01 LAB — COMPREHENSIVE METABOLIC PANEL
ALT: 191 U/L — AB (ref 14–63)
AST: 168 U/L — AB (ref 15–37)
Albumin: 3.2 g/dL — ABNORMAL LOW (ref 3.4–5.0)
Alkaline Phosphatase: 69 U/L (ref 46–116)
Anion Gap: 8 (ref 7–16)
BUN: 12 mg/dL (ref 7–18)
Bilirubin,Total: 1.7 mg/dL — ABNORMAL HIGH (ref 0.2–1.0)
CHLORIDE: 86 mmol/L — AB (ref 98–107)
CREATININE: 0.86 mg/dL (ref 0.60–1.30)
Calcium, Total: 8.2 mg/dL — ABNORMAL LOW (ref 8.5–10.1)
Co2: 22 mmol/L (ref 21–32)
GLUCOSE: 111 mg/dL — AB (ref 65–99)
Osmolality: 235 (ref 275–301)
Potassium: 4.8 mmol/L (ref 3.5–5.1)
Sodium: 116 mmol/L — CL (ref 136–145)
TOTAL PROTEIN: 5.5 g/dL — AB (ref 6.4–8.2)

## 2014-10-01 LAB — OSMOLALITY: Osmolality: 238 mOsm/kg — CL (ref 280–301)

## 2014-10-01 LAB — CK-MB: CK-MB: 4.1 ng/mL — ABNORMAL HIGH (ref 0.5–3.6)

## 2014-10-01 LAB — PROTIME-INR
INR: 3.4
PROTHROMBIN TIME: 34.3 s — AB

## 2014-10-01 LAB — TSH: Thyroid Stimulating Horm: 2.21 u[IU]/mL

## 2014-10-01 LAB — TROPONIN I: Troponin-I: 0.03 ng/mL

## 2014-10-02 ENCOUNTER — Ambulatory Visit: Payer: Medicare Other | Admitting: Family Medicine

## 2014-10-02 DIAGNOSIS — I5021 Acute systolic (congestive) heart failure: Secondary | ICD-10-CM

## 2014-10-02 LAB — BASIC METABOLIC PANEL
Anion Gap: 9 (ref 7–16)
Anion Gap: 8 (ref 7–16)
BUN: 14 mg/dL (ref 7–18)
BUN: 11 mg/dL (ref 7–18)
CALCIUM: 8.2 mg/dL — AB (ref 8.5–10.1)
CHLORIDE: 95 mmol/L — AB (ref 98–107)
CO2: 25 mmol/L (ref 21–32)
Calcium, Total: 8 mg/dL — ABNORMAL LOW (ref 8.5–10.1)
Chloride: 89 mmol/L — ABNORMAL LOW (ref 98–107)
Co2: 25 mmol/L (ref 21–32)
Creatinine: 0.88 mg/dL (ref 0.60–1.30)
Creatinine: 0.84 mg/dL (ref 0.60–1.30)
EGFR (African American): >60
EGFR (African American): >60
EGFR (Non-African Amer.): >60
GLUCOSE: 96 mg/dL (ref 65–99)
Glucose: 82 mg/dL (ref 65–99)
OSMOLALITY: 248 (ref 275–301)
OSMOLALITY: 256 (ref 275–301)
POTASSIUM: 4.5 mmol/L (ref 3.5–5.1)
POTASSIUM: 4.4 mmol/L (ref 3.5–5.1)
SODIUM: 123 mmol/L — AB (ref 136–145)
SODIUM: 128 mmol/L — AB (ref 136–145)

## 2014-10-02 LAB — CBC WITH DIFFERENTIAL/PLATELET
Basophil #: 0 10*3/uL (ref 0.0–0.1)
Basophil %: 0.1 %
Eosinophil #: 0 10*3/uL (ref 0.0–0.7)
Eosinophil %: 0.2 %
HCT: 37 % (ref 35.0–47.0)
HGB: 12.3 g/dL (ref 12.0–16.0)
LYMPHS ABS: 0.7 10*3/uL — AB (ref 1.0–3.6)
Lymphocyte %: 7.3 %
MCH: 33 pg (ref 26.0–34.0)
MCHC: 33.3 g/dL (ref 32.0–36.0)
MCV: 99 fL (ref 80–100)
MONOS PCT: 8 %
Monocyte #: 0.7 x10 3/mm (ref 0.2–0.9)
Neutrophil #: 7.6 10*3/uL — ABNORMAL HIGH (ref 1.4–6.5)
Neutrophil %: 84.4 %
Platelet: 172 10*3/uL (ref 150–440)
RBC: 3.74 10*6/uL — ABNORMAL LOW (ref 3.80–5.20)
RDW: 14.8 % — ABNORMAL HIGH (ref 11.5–14.5)
WBC: 9 10*3/uL (ref 3.6–11.0)

## 2014-10-02 LAB — PROTIME-INR
INR: 3.1
PROTHROMBIN TIME: 31.8 s — AB

## 2014-10-02 LAB — SODIUM, URINE, RANDOM: Sodium, Urine Random: 12 mmol/L (ref 20–110)

## 2014-10-02 LAB — SODIUM: Sodium: 137 mmol/L (ref 136–145)

## 2014-10-02 LAB — OSMOLALITY, URINE: Osmolality: 56 mOsm/kg

## 2014-10-03 ENCOUNTER — Encounter (HOSPITAL_COMMUNITY)
Admission: AD | Disposition: A | Discharge status: Home / Self Care | Payer: Medicare Other | Source: Other Acute Inpatient Hospital | Attending: Internal Medicine

## 2014-10-03 ENCOUNTER — Inpatient Hospital Stay (HOSPITAL_COMMUNITY)
Admission: AD | Admit: 2014-10-03 | Discharge: 2014-10-08 | DRG: 286 | Disposition: A | Discharge status: Home / Self Care | Payer: Medicare Other | Source: Other Acute Inpatient Hospital | Attending: Internal Medicine | Admitting: Internal Medicine

## 2014-10-03 ENCOUNTER — Inpatient Hospital Stay (HOSPITAL_COMMUNITY): Payer: Medicare Other

## 2014-10-03 ENCOUNTER — Encounter: Payer: Self-pay | Admitting: Cardiovascular Disease

## 2014-10-03 DIAGNOSIS — I482 Chronic atrial fibrillation: Secondary | ICD-10-CM | POA: Diagnosis present

## 2014-10-03 DIAGNOSIS — I2510 Atherosclerotic heart disease of native coronary artery without angina pectoris: Secondary | ICD-10-CM | POA: Diagnosis present

## 2014-10-03 DIAGNOSIS — Z7901 Long term (current) use of anticoagulants: Secondary | ICD-10-CM

## 2014-10-03 DIAGNOSIS — E871 Hypo-osmolality and hyponatremia: Secondary | ICD-10-CM | POA: Diagnosis present

## 2014-10-03 DIAGNOSIS — F329 Major depressive disorder, single episode, unspecified: Secondary | ICD-10-CM | POA: Diagnosis present

## 2014-10-03 DIAGNOSIS — J9600 Acute respiratory failure, unspecified whether with hypoxia or hypercapnia: Secondary | ICD-10-CM | POA: Diagnosis present

## 2014-10-03 DIAGNOSIS — R570 Cardiogenic shock: Secondary | ICD-10-CM | POA: Diagnosis present

## 2014-10-03 DIAGNOSIS — Z8673 Personal history of transient ischemic attack (TIA), and cerebral infarction without residual deficits: Secondary | ICD-10-CM

## 2014-10-03 DIAGNOSIS — Z681 Body mass index (BMI) 19 or less, adult: Secondary | ICD-10-CM | POA: Diagnosis not present

## 2014-10-03 DIAGNOSIS — I4892 Unspecified atrial flutter: Secondary | ICD-10-CM | POA: Diagnosis present

## 2014-10-03 DIAGNOSIS — S022XXA Fracture of nasal bones, initial encounter for closed fracture: Secondary | ICD-10-CM | POA: Diagnosis present

## 2014-10-03 DIAGNOSIS — E039 Hypothyroidism, unspecified: Secondary | ICD-10-CM | POA: Diagnosis present

## 2014-10-03 DIAGNOSIS — R1310 Dysphagia, unspecified: Secondary | ICD-10-CM | POA: Diagnosis present

## 2014-10-03 DIAGNOSIS — E440 Moderate protein-calorie malnutrition: Secondary | ICD-10-CM | POA: Insufficient documentation

## 2014-10-03 DIAGNOSIS — I509 Heart failure, unspecified: Secondary | ICD-10-CM

## 2014-10-03 DIAGNOSIS — T460X5A Adverse effect of cardiac-stimulant glycosides and drugs of similar action, initial encounter: Secondary | ICD-10-CM | POA: Diagnosis present

## 2014-10-03 DIAGNOSIS — I341 Nonrheumatic mitral (valve) prolapse: Secondary | ICD-10-CM | POA: Diagnosis present

## 2014-10-03 DIAGNOSIS — I099 Rheumatic heart disease, unspecified: Secondary | ICD-10-CM | POA: Diagnosis present

## 2014-10-03 DIAGNOSIS — K7200 Acute and subacute hepatic failure without coma: Secondary | ICD-10-CM | POA: Diagnosis not present

## 2014-10-03 DIAGNOSIS — W19XXXA Unspecified fall, initial encounter: Secondary | ICD-10-CM | POA: Diagnosis present

## 2014-10-03 DIAGNOSIS — I5021 Acute systolic (congestive) heart failure: Secondary | ICD-10-CM | POA: Diagnosis not present

## 2014-10-03 HISTORY — PX: RIGHT HEART CATHETERIZATION: SHX5447

## 2014-10-03 LAB — POCT I-STAT, CHEM 8
BUN: 8 mg/dL (ref 6–23)
CREATININE: 0.6 mg/dL (ref 0.50–1.10)
Calcium, Ion: 1.07 mmol/L — ABNORMAL LOW (ref 1.13–1.30)
Chloride: 95 mmol/L — ABNORMAL LOW (ref 96–112)
Glucose, Bld: 77 mg/dL (ref 70–99)
HCT: 37 % (ref 36.0–46.0)
Hemoglobin: 12.6 g/dL (ref 12.0–15.0)
POTASSIUM: 3.7 mmol/L (ref 3.5–5.1)
Sodium: 135 mmol/L (ref 135–145)
TCO2: 25 mmol/L (ref 0–100)

## 2014-10-03 LAB — COMPREHENSIVE METABOLIC PANEL
ALBUMIN: 2.8 g/dL — AB (ref 3.5–5.2)
ALT: 205 U/L — AB (ref 0–35)
AST: 146 U/L — AB (ref 0–37)
Alkaline Phosphatase: 65 U/L (ref 39–117)
Anion gap: 7 (ref 5–15)
BILIRUBIN TOTAL: 1.4 mg/dL — AB (ref 0.3–1.2)
BUN: 8 mg/dL (ref 6–23)
CALCIUM: 8.1 mg/dL — AB (ref 8.4–10.5)
CHLORIDE: 95 mmol/L — AB (ref 96–112)
CO2: 30 mmol/L (ref 19–32)
CREATININE: 0.73 mg/dL (ref 0.50–1.10)
GFR calc Af Amer: >90 mL/min (ref >90)
GFR, EST NON AFRICAN AMERICAN: 85 mL/min — AB (ref >90)
Glucose, Bld: 75 mg/dL (ref 70–99)
Potassium: 4 mmol/L (ref 3.5–5.1)
SODIUM: 132 mmol/L — AB (ref 135–145)
TOTAL PROTEIN: 5.1 g/dL — AB (ref 6.0–8.3)

## 2014-10-03 LAB — CBC
HCT: 37 % (ref 36.0–46.0)
HEMOGLOBIN: 12.6 g/dL (ref 12.0–15.0)
MCH: 32.6 pg (ref 26.0–34.0)
MCHC: 34.1 g/dL (ref 30.0–36.0)
MCV: 95.9 fL (ref 78.0–100.0)
Platelets: 225 10*3/uL (ref 150–400)
RBC: 3.86 MIL/uL — AB (ref 3.87–5.11)
RDW: 15.3 % (ref 11.5–15.5)
WBC: 6.6 10*3/uL (ref 4.0–10.5)

## 2014-10-03 LAB — POCT I-STAT 3, VENOUS BLOOD GAS (G3P V)
ACID-BASE EXCESS: 3 mmol/L — AB (ref 0.0–2.0)
Acid-Base Excess: 2 mmol/L (ref 0.0–2.0)
Acid-Base Excess: 3 mmol/L — ABNORMAL HIGH (ref 0.0–2.0)
BICARBONATE: 27.4 meq/L — AB (ref 20.0–24.0)
BICARBONATE: 28 meq/L — AB (ref 20.0–24.0)
Bicarbonate: 27.5 mEq/L — ABNORMAL HIGH (ref 20.0–24.0)
O2 SAT: 63 %
O2 SAT: 69 %
O2 Saturation: 65 %
PCO2 VEN: 42.2 mmHg — AB (ref 45.0–50.0)
PCO2 VEN: 43.7 mmHg — AB (ref 45.0–50.0)
PH VEN: 7.407 — AB (ref 7.250–7.300)
PO2 VEN: 33 mmHg (ref 30.0–45.0)
PO2 VEN: 34 mmHg (ref 30.0–45.0)
TCO2: 29 mmol/L (ref 0–100)
TCO2: 29 mmol/L (ref 0–100)
TCO2: 29 mmol/L (ref 0–100)
pCO2, Ven: 44.5 mmHg — ABNORMAL LOW (ref 45.0–50.0)
pH, Ven: 7.405 — ABNORMAL HIGH (ref 7.250–7.300)
pH, Ven: 7.422 — ABNORMAL HIGH (ref 7.250–7.300)
pO2, Ven: 36 mmHg (ref 30.0–45.0)

## 2014-10-03 LAB — BASIC METABOLIC PANEL
ANION GAP: 7 (ref 7–16)
BUN: 8 mg/dL (ref 7–18)
CO2: 26 mmol/L (ref 21–32)
Calcium, Total: 7.8 mg/dL — ABNORMAL LOW (ref 8.5–10.1)
Chloride: 105 mmol/L (ref 98–107)
Creatinine: 0.81 mg/dL (ref 0.60–1.30)
EGFR (Non-African Amer.): >60
Glucose: 65 mg/dL (ref 65–99)
Osmolality: 272 (ref 275–301)
Potassium: 4.2 mmol/L (ref 3.5–5.1)
Sodium: 138 mmol/L (ref 136–145)

## 2014-10-03 LAB — PROTIME-INR
INR: 2.5
INR: 2.13 — ABNORMAL HIGH (ref 0.00–1.49)
Prothrombin Time: 26.7 secs — ABNORMAL HIGH
Prothrombin Time: 24 seconds — ABNORMAL HIGH (ref 11.6–15.2)

## 2014-10-03 LAB — MRSA PCR SCREENING: MRSA by PCR: NEGATIVE

## 2014-10-03 LAB — DIGOXIN LEVEL
DIGOXIN: 3 ng/mL — AB
Digoxin Level: 1.4 ng/mL (ref 0.8–2.0)

## 2014-10-03 LAB — APTT: APTT: 35 s (ref 24–37)

## 2014-10-03 LAB — TSH: TSH: 0.68 u[IU]/mL (ref 0.350–4.500)

## 2014-10-03 LAB — MAGNESIUM
MAGNESIUM: 2.3 mg/dL
MAGNESIUM: 2.1 mg/dL (ref 1.5–2.5)

## 2014-10-03 LAB — BRAIN NATRIURETIC PEPTIDE: B Natriuretic Peptide: 1697.7 pg/mL — ABNORMAL HIGH (ref 0.0–100.0)

## 2014-10-03 LAB — PHOSPHORUS: Phosphorus: 2.6 mg/dL (ref 2.5–4.9)

## 2014-10-03 LAB — AMMONIA: Ammonia: 24 umol/L (ref 11–32)

## 2014-10-03 SURGERY — RIGHT HEART CATH
Anesthesia: LOCAL

## 2014-10-03 MED ORDER — CARVEDILOL 3.125 MG PO TABS: 3.1250 mg | ORAL_TABLET | Freq: Two times a day (BID) | ORAL | Status: DC

## 2014-10-03 MED ORDER — DOCUSATE SODIUM 100 MG PO CAPS
100.0000 mg | ORAL_CAPSULE | Freq: Two times a day (BID) | ORAL | Status: DC | PRN
  Filled 2014-10-03: qty 1

## 2014-10-03 MED ORDER — SODIUM CHLORIDE 0.9 % IV SOLN: 250.0000 mL | INTRAVENOUS | Status: DC | PRN

## 2014-10-03 MED ORDER — WARFARIN - PHARMACIST DOSING INPATIENT: Freq: Every day | Status: DC

## 2014-10-03 MED ORDER — ACETAMINOPHEN 325 MG PO TABS: 650.0000 mg | ORAL_TABLET | ORAL | Status: DC | PRN

## 2014-10-03 MED ORDER — LISINOPRIL 5 MG PO TABS: 5.0000 mg | ORAL_TABLET | Freq: Every day | ORAL | Status: DC

## 2014-10-03 MED ORDER — SODIUM CHLORIDE 0.9 % IJ SOLN: 3.0000 mL | INTRAMUSCULAR | Status: DC | PRN

## 2014-10-03 MED ORDER — LEVOTHYROXINE SODIUM 50 MCG PO TABS
50.0000 ug | ORAL_TABLET | Freq: Every day | ORAL | Status: DC
  Administered 2014-10-04 – 2014-10-08 (×5): 50 ug via ORAL
  Filled 2014-10-03 (×6): qty 1

## 2014-10-03 MED ORDER — CITALOPRAM HYDROBROMIDE 20 MG PO TABS: 20.0000 mg | ORAL_TABLET | Freq: Every day | ORAL | Status: DC

## 2014-10-03 MED ORDER — ONDANSETRON HCL 4 MG/2ML IJ SOLN: 4.0000 mg | Freq: Four times a day (QID) | INTRAMUSCULAR | Status: DC | PRN

## 2014-10-03 MED ORDER — PANTOPRAZOLE SODIUM 40 MG PO TBEC
40.0000 mg | DELAYED_RELEASE_TABLET | Freq: Every day | ORAL | Status: DC
  Administered 2014-10-04 – 2014-10-08 (×5): 40 mg via ORAL
  Filled 2014-10-03 (×5): qty 1

## 2014-10-03 MED ORDER — HEPARIN (PORCINE) IN NACL 2-0.9 UNIT/ML-% IJ SOLN
INTRAMUSCULAR | Status: AC
  Filled 2014-10-03: qty 500

## 2014-10-03 MED ORDER — SODIUM CHLORIDE 0.9 % IJ SOLN
3.0000 mL | Freq: Two times a day (BID) | INTRAMUSCULAR | Status: DC
  Administered 2014-10-03: 3 mL via INTRAVENOUS

## 2014-10-03 MED ORDER — METOPROLOL TARTRATE 1 MG/ML IV SOLN: 2.5000 mg | Freq: Four times a day (QID) | INTRAVENOUS | Status: DC | PRN

## 2014-10-03 MED ORDER — ACETAMINOPHEN 325 MG PO TABS: 650.0000 mg | ORAL_TABLET | Freq: Four times a day (QID) | ORAL | Status: DC | PRN

## 2014-10-03 MED ORDER — HYDROXYZINE HCL 25 MG PO TABS
25.0000 mg | ORAL_TABLET | Freq: Two times a day (BID) | ORAL | Status: DC
  Administered 2014-10-03 – 2014-10-08 (×10): 25 mg via ORAL
  Filled 2014-10-03 (×12): qty 1

## 2014-10-03 MED ORDER — LISINOPRIL 5 MG PO TABS
5.0000 mg | ORAL_TABLET | Freq: Every day | ORAL | Status: DC
  Administered 2014-10-04: 5 mg via ORAL
  Filled 2014-10-03 (×3): qty 1

## 2014-10-03 MED ORDER — CETYLPYRIDINIUM CHLORIDE 0.05 % MT LIQD
7.0000 mL | Freq: Two times a day (BID) | OROMUCOSAL | Status: DC
  Administered 2014-10-03 – 2014-10-06 (×6): 7 mL via OROMUCOSAL

## 2014-10-03 MED ORDER — POTASSIUM CHLORIDE CRYS ER 20 MEQ PO TBCR
20.0000 meq | EXTENDED_RELEASE_TABLET | Freq: Two times a day (BID) | ORAL | Status: DC
  Administered 2014-10-03 – 2014-10-08 (×10): 20 meq via ORAL
  Filled 2014-10-03 (×13): qty 1

## 2014-10-03 MED ORDER — LIDOCAINE HCL (PF) 1 % IJ SOLN
INTRAMUSCULAR | Status: AC
  Filled 2014-10-03: qty 30

## 2014-10-03 MED ORDER — CITALOPRAM HYDROBROMIDE 20 MG PO TABS
20.0000 mg | ORAL_TABLET | Freq: Every day | ORAL | Status: DC
  Administered 2014-10-04 – 2014-10-08 (×5): 20 mg via ORAL
  Filled 2014-10-03 (×6): qty 1

## 2014-10-03 MED ORDER — SODIUM CHLORIDE 0.9 % IJ SOLN: 3.0000 mL | Freq: Two times a day (BID) | INTRAMUSCULAR | Status: DC

## 2014-10-03 MED ORDER — WARFARIN SODIUM 5 MG PO TABS
5.0000 mg | ORAL_TABLET | Freq: Once | ORAL | Status: AC
Start: 2014-10-03 — End: 2014-10-03
  Administered 2014-10-03: 5 mg via ORAL
  Filled 2014-10-03: qty 1

## 2014-10-03 MED ORDER — ACETAMINOPHEN 325 MG PO TABS
650.0000 mg | ORAL_TABLET | ORAL | Status: DC | PRN
  Administered 2014-10-06: 650 mg via ORAL
  Filled 2014-10-03: qty 2

## 2014-10-03 MED ORDER — SODIUM CHLORIDE 0.9 % IJ SOLN
10.0000 mL | Freq: Two times a day (BID) | INTRAMUSCULAR | Status: DC
  Administered 2014-10-03 – 2014-10-07 (×8): 10 mL

## 2014-10-03 MED ORDER — ALUM & MAG HYDROXIDE-SIMETH 200-200-20 MG/5ML PO SUSP
15.0000 mL | Freq: Four times a day (QID) | ORAL | Status: DC | PRN
  Administered 2014-10-04 – 2014-10-05 (×3): 15 mL via ORAL
  Filled 2014-10-03 (×3): qty 30

## 2014-10-03 MED ORDER — LISINOPRIL 2.5 MG PO TABS: 2.5000 mg | ORAL_TABLET | Freq: Every day | ORAL | Status: DC

## 2014-10-03 MED ORDER — ASPIRIN 81 MG PO CHEW: 81.0000 mg | CHEWABLE_TABLET | ORAL | Status: DC

## 2014-10-03 MED ORDER — SODIUM CHLORIDE 0.9 % IJ SOLN: 10.0000 mL | INTRAMUSCULAR | Status: DC | PRN

## 2014-10-03 MED ORDER — FUROSEMIDE 10 MG/ML IJ SOLN: 20.0000 mg | Freq: Two times a day (BID) | INTRAMUSCULAR | Status: DC

## 2014-10-03 MED ORDER — FUROSEMIDE 40 MG PO TABS
40.0000 mg | ORAL_TABLET | Freq: Every day | ORAL | Status: DC
  Administered 2014-10-04 – 2014-10-08 (×5): 40 mg via ORAL
  Filled 2014-10-03 (×6): qty 1

## 2014-10-03 MED ORDER — MUPIROCIN 2 % EX OINT
TOPICAL_OINTMENT | Freq: Two times a day (BID) | CUTANEOUS | Status: DC
  Administered 2014-10-03: 22:00:00 via NASAL
  Administered 2014-10-04: 1 via NASAL
  Administered 2014-10-04 – 2014-10-07 (×7): via NASAL
  Filled 2014-10-03 (×2): qty 22

## 2014-10-03 MED ORDER — ASPIRIN EC 81 MG PO TBEC
81.0000 mg | DELAYED_RELEASE_TABLET | Freq: Every day | ORAL | Status: DC
  Filled 2014-10-03: qty 1

## 2014-10-03 MED ORDER — MORPHINE SULFATE 2 MG/ML IJ SOLN: 2.0000 mg | INTRAMUSCULAR | Status: DC | PRN

## 2014-10-03 MED ORDER — WARFARIN SODIUM 5 MG PO TABS: 5.0000 mg | ORAL_TABLET | Freq: Once | ORAL | Status: DC

## 2014-10-03 MED ORDER — CARVEDILOL 3.125 MG PO TABS
3.1250 mg | ORAL_TABLET | Freq: Two times a day (BID) | ORAL | Status: DC
  Administered 2014-10-03 – 2014-10-04 (×2): 3.125 mg via ORAL
  Filled 2014-10-03 (×6): qty 1

## 2014-10-03 NOTE — H&P (Signed)
History and Physical  Patient ID: [name] MRN: [id number], DOB: 1946/06/13 Date of Encounter: 10/03/2014, 1:44 PM Primary Physician: Owens Loffler, MD Primary Cardiologist: Dr. Percival Spanish, MD  Chief Complaint: Left sided chest pain, fatigue/lethargy, and dysphagia  Reason for Admission: Acute systolic CHF  HPI: 69 year old female with remote history of non-obstructive CAD (cardiac cath 2005), history of rheumatic fever/MVPin childhood leading to severe mitral regurgitation s/p mitral valve repair with #34 mm Seguin annuloplasty ring, quadrangular resection of posterior mitral valve leaflets X2 with sliding leaflet annuloplasty, resection of fibrotic chordae tendineae with replacement with artificial Gore-Tex cords to anterior leaflet X2, and ligation of the left atrial appendage in 2005. She also has history of chronic a-fib s/p Cox-Maze procedure in 2012 during the above MVR, GR2DD in 2012 (EF 60-65% at that time), history of prior stroke involving the superior cerebellar region, hypothyroidism, depression, and anxiety who presented to [facility] on 09/30/2014 with a one day history of left sided breast pain and esophageal spasm. She has been noting increased dyspnea for the past several weeks. She was last seen in outpatient cardiology in 2013, and has been lost to follow up since that time, except for her PT/INR.   She has a history of rheumatic fever as a child who has had a long standing history of mitral regurgitation and mitral valve prolapse. She was referred to Dr. Percival Spanish in 11/2002 for evaluation of persistent a-fib.She was started on Coumadin and a calcium channel blocker atthat time.In December 2004 echo showed moderate tosevere mitral regurgitation with markedly enlarged left atrium and moderatedilatation of the left ventricle but otherwise well preserved leftventricular systolic function.Because of progressive chamber enlargementand the severity of her mitral regurgitation she  underwent a TEE on September 12, 2003. Once again this confirmed the presenceof moderate to severe mitral regurgitation with associated left atrial andleft ventricular chamber enlargement, annular dilatation of mitral valveannulus and bileaflet prolapse with thickened anterior and posteriorleaflets of the mitral valve.She subsequently underwent elective left andright heart catheterization's on September 16, 2003. She was not found to have any significant coronary artery disease.Her pulmonary artery pressures were average of 21/10 with pulmonary capillary wedge pressure of 12. She underwent successful MVR 09/2003 as above. She has done well since then. During that procedure she also underwent Cox-Maze procedure and clipping of the left atrial appendage. Last echo from 2012 showed an EF of 6065%, GR2DD, stable MVR. At her last outpatient follow up in 2013 she was noted to be in atrial flutter 2:1 conduction with HR of 132. She wore a 24 hour Holter monitor which showed atrial flutter with variable conduction. She was scheduled for outpatient cardioversion with Dr. Ron Parker. She underwent successful DCCV on 06/04/2012. Since that time she has only followed up for her anticoagulation appointments.   She presented to [facility] on 09/30/2014 with a several week history of fatigue, weakness, and increased dyspnea. She also noted a one day history of left sided breast pain and esophageal spasm that has prevented her from eating. Her husband feels like her symptoms are 2/2 her anxiety and depresison medications and would like for her to stop them, her daughter feels like she sits at home too much and would like for her to get out more often. She reports increased SOB since the end of December. She has been sleeping with 3 pillows. Her weight has been declining, though she does not eat much. She drinks 5-10 "regular sized" water bottles daily. She does apply salt to some  foods and eats out at restaurants a couple of times weekly.  She has noted some lower extremity swelling. Some early satiety. No cough. On 2/2 she attempted to eat but was unable to 2/2 significant burning sensation along her esophagous. This sensation still persists. No further left sided breast pain. She was at rest when her left sided breast pain initially began.   Upon her arrival to [facility] she was found to have CXR that showed cardiomegaly, vascular congestion, edema, and pulmonary effusions, chest CT negative for PE, but did show mitral stenosis. Troponin negative x 3. EKG showed a-fib with RVR, 128 bpm, right axis deviation, occasional PVC, no significant st/t changes. It was uncertain how long she had been in a-fib given that she was cardioverted in 05/2012 then lost to follow up. She did remain on warfarin. Echo showed EF 10%, severely decreased global left ventricular systolic function, severely increased left ventricular internal cavity size, restrictive pattern of LV diastolic filling, moderately reduced RV systolic function, severely dilated LA at 6.4 cm, moderately dilated RA, moderate pleural effusion in the left lateral region, mild mitral stenosis, moderate MR, mild TR, severe thickening and calcification of the anterior and posterior mitral valve leaflets. Rheumatic deformity of the mitral valve can not be excluded. She remained hypotensive with blood pressures in the 90s/60s, limiting titration of b-blocker or addition of CCB. HR difficult to control with rates in the 120s, rhythm in atrial flutter 2:1 conduction. She was started on digoxin 0.25 mg daily and received an extra dose on 2/4. She did apparently suffer a mechanical fall overnight going into 2/4 and fractured her nasal bones. Ortho was consulted and recommended conservative management at that time. Apparently, she received some extra doses of digoxin overnight going into 2/5, raising her digoxin level to 3.0. Her digoxin was held on the morning of 2/5. She was also placed on a dobutamine gtt  overnight by IM. Upon rounding on her this morning her HR is improved into the 80s, remained in atrial flutter. She was more alert. It was decided to transfer to [facility] for further evaluation by the Advanced Heart Failure Team.    Past Medical History  Diagnosis Date  . H/O: rheumatic fever     Childhood  . Mitral regurgitation     s/p MV repair  . CVA (cerebral infarction)     h/o superior cerebellar infarct  . Atrial fibrillation     s/p cardioversion  . Hypothyroid   . History of nephrolithiasis   . Asthma   . Anxiety   . Depression   . Headache(784.0)     Migraine  . Urinary incontinence   . Acne rosacea   . Diverticulosis   . Osteopenia      Most Recent Cardiac Studies: Echo: 09/30/2014  Summary:  1. Left ventricular ejection fraction, by visual estimation, is 10%.  2. Severely decreased global left ventricular systolic function.  3. Severely increased left ventricular internal cavity size.  4. Restrictive pattern of LV diastolic filling.  5. Moderately reduced RV systolic function.  6. Severely dilated left atrium.  7. Moderately dilated right atrium.  8. Moderate pleural effusion in the left lateral region.  9. Mild mitral stenosis. 10. Moderate mitral valve regurgitation. 11. Severe thickening and calcification of the anterior and posterior  mitral valve leaflets. Rheumatic deformity of the mitral valve can not be excluded.    Surgical History:  Past Surgical History  Procedure Laterality Date  . Open heart surgery  2005  Maze  . Mv repair  2005    Maze  . Abdominal hysterectomy  1980's    (no cancer),ovaries present  . Thyroidectomy      partial-no cancer  . Lumbar disc surgery      x 2 distantly  . Cardioversion  06/04/2012    Procedure: CARDIOVERSION;  Surgeon: Carlena Bjornstad, MD;  Location: [facility];  Service: Cardiovascular;  Laterality: N/A;     Home Meds: Prior to Admission medications   Medication Sig Start Date End Date Taking?  Authorizing Provider  hydrOXYzine (ATARAX/VISTARIL) 25 MG tablet TAKE 1 TABLET TWICE DAILY AS NEEDED FOR ANXIETY 08/11/14   Owens Loffler, MD  levothyroxine (SYNTHROID, LEVOTHROID) 50 MCG tablet TAKE ONE TABLET BY MOUTH EVERY DAY 04/02/14   Jinny Sanders, MD  warfarin (COUMADIN) 5 MG tablet Take as directed by anticoagulation clinic 02/12/14   Minus Breeding, MD    Allergies: No Known Allergies  History   Social History  . Marital Status: Married    Spouse Name: N/A    Number of Children: 2  . Years of Education: N/A   Occupational History  . Retired    Social History Main Topics  . Smoking status: Never Smoker   . Smokeless tobacco: Never Used  . Alcohol Use: No  . Drug Use: No  . Sexual Activity: Not on file   Other Topics Concern  . Not on file   Social History Narrative  . No narrative on file     Family History  Problem Relation Age of Onset  . Diabetes Mother   . Coronary artery disease Mother   . Kidney disease Mother     Review of Systems: General: positive for fatigue and weight loss.  negative for chills, fever, or night sweats  Cardiovascular: positive for chest pain as above, SOB/ DOE. negative for edema, orthopnea, palpitations, or paroxysmal nocturnal dyspnea Dermatological: negative for rash Respiratory: negative for cough or wheezing Urologic: negative for hematuria Abdominal: positive for dysphagia. negative for nausea, vomiting, diarrhea, bright red blood per rectum, melena, or hematemesis Neurologic: negative for visual changes, syncope, or dizziness All other systems reviewed and are otherwise negative except as noted above.  Labs:   Lab Results  Component Value Date   WBC 5.7 01/09/2014   HGB 15.1* 01/09/2014   HCT 44.8 01/09/2014   MCV 98.1 01/09/2014   PLT 211.0 01/09/2014   No results for input(s): NA, K, CL, CO2, BUN, CREATININE, CALCIUM, PROT, BILITOT, ALKPHOS, ALT, AST, GLUCOSE in the last 168 hours.  Invalid input(s): LABALBU No  results for input(s): CKTOTAL, CKMB, TROPONINI in the last 72 hours. Lab Results  Component Value Date   CHOL 190 05/04/2011   HDL 47.00 05/04/2011   LDLCALC 107* 05/04/2011   TRIG 182.0* 05/04/2011    Radiology/Studies:  See patient's packet   EKG: a-fib with RVR, 128 bpm, right axis deviation, occasional PVC, no significant st/t changes  Physical Exam: Temp: 97.5, BP:93/66, Pulse: 118, R: 20 Pulse ox: 95% on 2L General: Well developed, well nourished, in no acute distress. Lethargic.  Head: Normocephalic, atraumatic, sclera non-icteric, no xanthomas, nares are without discharge.  Neck: Negative for carotid bruits. JVD not elevated. Lungs: Clear bilaterally to auscultation without wheezes, rales, or rhonchi. Breathing is unlabored. Heart: RRR with S1 S2. 2/6 systolic murmur. No rubs, or gallops appreciated. Abdomen: Soft, non-tender, non-distended with normoactive bowel sounds. No hepatomegaly. No rebound/guarding. No obvious abdominal masses. Msk:  Strength and tone appear normal for  age. Extremities: No clubbing or cyanosis. No edema.  Distal pedal pulses are 2+ and equal bilaterally. Neuro: Alert and oriented X 3. No focal deficit. No facial asymmetry. Moves all extremities spontaneously. Psych:  Responds to questions appropriately with a normal affect.    ASSESSMENT AND PLAN:  1. A-fib/flutter with RVR: -She has history of chronic a-fib/flutter -It is not known how long she has been in this rhythm, thus raising the question that her cardiomyopathy make be 2/2 her valvular heart disease as well as tachy-mediated -It is unlikely she would hold NSR with DCCV given her severely dilated left atirum at 6.4 cm -She was transfered to CCU overnight and started on dobutamine gtt 2 mg/hr, this has been stopped this AM. She was also apparently given extra doses of digoxin elevating her digoxin to 3.0, this has been held -Rate controlled currently this morning in the 70s-80s, in atrial  flutter -Blood pressures are improved this morning, in the low 711A to 579U systolic  -INR therapeutic, held in the setting of fall ----->Transfer to [facility] for evaluation by the Advanced Heart Failure Team, question possible LVAD. She will require right and left heart cath as part of this work up.  2. Acute systolic CHF: -Echo as above in HPI -Prior echo in 2012 with nl LV function -Likely 2/2 combination of valvular heart disease and tachypalpitations -Lasix held 2/2 hypotension  -Incentive spirometer, supplemental O2 -To be evaluated by heart failure team as above  3. Dysphagia: -Currently being treated conservatively with PPI, per GI too high risk for any procedures  -Will need to hold warfarin until INR <1.5 if move forward with procedure -PPI  4. Status post mitral valve repair 2005: -As above  5. Hyponatremia: -Resolved -Likely hypervolemic  6. Fall: -Warfarin held -INR therapeutic -Head CT negative for acute bleed  Signed, Yvanna Vidas PA-C 10/03/2014, 1:44 PM

## 2014-10-03 NOTE — Progress Notes (Addendum)
ANTICOAGULATION CONSULT NOTE - Initial Consult  Pharmacy Consult for warfarin Indication: afib, mitral valve repair  No Known Allergies  Patient Measurements: Height: 5' 9.5" (176.5 cm) Weight: 130 lb 1.1 oz (59 kg) IBW/kg (Calculated) : 67.35   Vital Signs: Temp: 98.2 F (36.8 C) (02/05 1200) Temp Source: Oral (02/05 1200) BP: 128/59 mmHg (02/05 1500) Pulse Rate: 62 (02/05 1430)  Labs: No results for input(s): HGB, HCT, PLT, APTT, LABPROT, INR, HEPARINUNFRC, CREATININE, CKTOTAL, CKMB, TROPONINI in the last 72 hours.  CrCl cannot be calculated (Patient has no serum creatinine result on file.).   Medical History: Past Medical History  Diagnosis Date  . H/O: rheumatic fever     Childhood  . Mitral regurgitation     s/p MV repair  . CVA (cerebral infarction)     h/o superior cerebellar infarct  . Atrial fibrillation     s/p cardioversion  . Hypothyroid   . History of nephrolithiasis   . Asthma   . Anxiety   . Depression   . Headache(784.0)     Migraine  . Urinary incontinence   . Acne rosacea   . Diverticulosis   . Osteopenia     Medications:  Warfarin pta  Assessment: 69 year old female with remote history of non-obstructive CAD (cardiac cath 2005), history of rheumatic fever/MVPin childhood leading to severe mitral regurgitation s/p mitral valve repair.   Patient transferred from [facility] today for aggressive medical management by advanced heart failure team.   Patient ordered warfarin per pharmacy. D/w Dr.Bensimhon and will hold dosing for now as will place central line today and may require further procedures moving forward. Will follow up need for transition to heparin as INR trends downward.  Goal of Therapy:  INR 2-3 Monitor platelets by anticoagulation protocol: Yes   Plan:  Check INR now Hold warfarin for now - cardiology to clarify when ok to resume  Erin Hearing PharmD., BCPS Clinical Pharmacist Pager [phone] 10/03/2014 3:45  PM    Addendum  -Warfarin to be resumed s/p R heart cath -Pt unsure of warfarin dose PTA, most recent records form outpatient anticoagulation clinic shows 5 mg Tues/Sat and 7.5 mg po on all other days -Current INR 2.1 -Will give 5 mg po x1 -Monitor daily INR    Hughes Better, PharmD, BCPS Clinical Pharmacist Pager: [phone] 10/03/2014 6:42 PM

## 2014-10-03 NOTE — CV Procedure (Signed)
Cardiac Cath Procedure Note:  Indication:  Heart failure  Procedures performed:  1) Right heart catheterization 2) Central venous line insertion  Description of procedure:   The risks and indication of the procedure were explained. Consent was signed and placed on the chart. An appropriate timeout was taken prior to the procedure. The right neck was prepped and draped in the routine sterile fashion and anesthetized with 1% local lidocaine.   A 7 FR venous sheath was placed in the right internal jugular vein using a modified Seldinger technique. A standard Swan-Ganz catheter was used for the procedure.   After the [place] was removed and the sheath was exchanges over a wire for a triple lumen venous catheter. Position confirmed under fluoro. Ports were flush and line sutured into place.   Complications: None apparent.  Findings:  RA = 5 RV = 35/2/4 PA = 45/12 (22) PCW = 16 Fick cardiac output/index = 3.9/2.3 PVR = 1.5 WU Ao sat = 97% PA sat = 63%, 69% SVC sat = 65%  Assessment:  1. Well compensated hemodynamics after diuresis 2. No intracardiac shunt  Plan/Discussion:  Start low-dose carvedilol and lisinopril. Resume po diuretics in am to keep I/O and weight even. Consider TEE/DC-CV for AF.  Glori Bickers MD 5:09 PM

## 2014-10-03 NOTE — H&P (Signed)
General Aspect Primary Cardiologist: Dr. Percival Spanish, MD _________________  69 year old female with history of rheumatic fever/MVPin childhood leading to severe mitral regurgitation s/p MVR with #34 mm Seguin annuloplasty ring, quadrangular resection of posterior mitral valve leaflets X2 with sliding leaflet annuloplasty, resection of fibrotic chordae tendineae with replacement with artificial Gore-Tex cords to anterior leaflet X2, and ligation of the left atrial appendage in 2005. 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 She has been noting increased dyspnea for the past several weeks. Cardiology was consulted 2/2 CXR showing pulmonary edema and effusions.  _________________  PMH: 1. Rheumatic fever/MVPin childhood leading to severe mitral regurgitation s/p MVR with #34 mm Seguin annuloplasty ring, quadrangular resection of posterior mitral valve leaflets X2 with sliding leaflet annuloplasty, resection of fibrotic chordae tendineae with replacement with artificial Gore-Tex cords to anterior leaflet X2, and ligation of the left atrial appendage in 2005 2. Chronic a-fib s/p Cox-Maze procedure in 2012 during the above MVR s/p DCCV 2013 on Coumadin 3. Non-obstructive CAD (cardiac cath 2005) 4. 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 History of prior stroke involving the superior cerebellar region 6. Hypothyroidism 7. Depression 8. Anxiety ________________   Present Illness 69 year old female with the above complex problem list who presented to [facility] on 09/30/2014 with a several week history of increased dyspnea and a one day history of left sided breast pain and  esophageal spasm. She was last seen in outpatient cardiology in 2013, and has been lost to follow up since that time.   She has a history of rheumatic fever as a child who has had a long standing history of mitral regurgitation and mitral valve prolapse. She was referred to Dr. Minus Breeding in April of 2004 for evaluation of persistent atrialfibrillation.She was started on Coumadin and a calcium channel blocker atthat time.In December 2004 an echo was performed which showed moderate tosevere mitral regurgitation with markedly enlarged left atrium and moderatedilatation of the left ventricle but otherwise well preserved leftventricular systolic function.Because of progressive chamber enlargementand the severity of her mitral regurgitation she underwent a TEE on September 12, 2003. Once again this confirmed the presenceof moderate to severe mitral regurgitation with associated left atrial andleft ventricular chamber enlargement, annular dilatation of mitral valveannulus and bileaflet prolapse with thickened anterior and posteriorleaflets of the mitral valve.She subsequently underwent elective left andright heart catheterization's on September 16, 2003. She was not found to have any significant coronary artery disease.Her pulmonary artery pressures were average of 21/10 with pulmonary capillary wedge pressure of 12. She underwent successful MVR 09/2003 as above. She has done well since then. During that procedure she also underwent Cox-Maze procedure and clipping of the left atrial appendage. Last echo from 2012 showed an EF of 6065%, GR2DD, stable MVR.  At her last outpatient follow up in 2013 she was noted to be in atrial flutter 2:1 conduction with HR of 132. She wore a 24 hour Holter monitor which showed atrial flutter with variable conduction. She was scheduled for outpatient cardioversion with Dr. Ron Parker. She underwent successful DCCV on 06/04/2012. Since that time she has only followed up for her  anticoagulation appointments.   She presented  to [facility] on 09/30/2014 with a several week history of fatigue, weakness, and increased dyspnea. She also noted a one day history of left sided breast pain and esophageal spasm that has prevented her from eating. Her husband feels like her symptoms are 2/2 her anxiety and depresison medications and would like for her to stop them, her daughter feels like she sits at home too much and would like for her to get out more often. She reports increased SOB since the end of December. She has been sleeping with 3 pillows. Her weight has been declining, though she does not eat much. She drinks 5-10 "regular sized" water bottles daily. She does apply salt to some foods and eats out at restaurants a couple of times weekly. She has noted some lower extremity swelling. Some early satiety. No cough. On 2/2 she attempted to eat but was unable to 2/2 significant burning sensation along her esophagous. This sensation still persists. No further left sided breast pain. She was at rest when her left sided breast pain initially began.   Upon her arrival to [facility] she was found to have CXR that showed cardiomegaly, vascular congestion, edema, and pulmonary effusions, chest CT negative for PE, but did show mitral stenosis. Troponin negative x 3.   She is currently resting in bed comfortably with her daughter at her bedside.   Physical Exam:  GEN no acute distress, thin   HEENT hearing intact to voice   NECK supple   RESP normal resp effort  crackles   CARD Tachycardic  Murmur   ABD denies tenderness  soft   EXTR negative edema   NEURO cranial nerves intact   PSYCH alert   Review of Systems:  General: Fatigue  Weakness   Skin: No Complaints   ENT: No Complaints   Eyes: No Complaints   Neck: No Complaints   Respiratory: Short of breath   Cardiovascular: Chest pain or discomfort  Dyspnea  Orthopnea  Edema   Gastrointestinal: No Complaints   Genitourinary:  No Complaints   Vascular: No Complaints   Musculoskeletal: No Complaints   Neurologic: No Complaints   Hematologic: No Complaints   Endocrine: No Complaints   Psychiatric: No Complaints   Review of Systems: All other systems were reviewed and found to be negative   Medications/Allergies Reviewed Medications/Allergies reviewed   Family & Social History:  Family and Social History:  Family History Coronary Artery Disease  Hypertension  Diabetes Mellitus   Social History negative tobacco, negative ETOH, negative Illicit drugs   Place of Living Home     Hypothyroidism:    heart murmur:   Home Medications: Medication Instructions Status  clonazePAM 1 mg oral tablet 1 tab(s) orally once a day at 4am. Active  levothyroxine 50 mcg (0.05 mg) oral tablet 1 tab(s) orally once a day Active  warfarin 10 mg oral tablet 1 tab(s) orally once a day on Monday, Tuesday, Thursday, Friday, and Sunday. Active  warfarin 5 mg oral tablet 1 tab(s) orally once a day on Wednesday and Sunday along with $RemoveBe'3mg'lFNqQzyiK$  ta'8mg'$  total. Active  warfarin 3 mg oral tablet 1 tab(s) orally once a day on Wednesday and Sunday along with $RemoveBe'5mg'IjJhfasuk$  ta'8mg'$  total Active  Tylenol 500 mg oral tablet 1 tab(s) orally 2 to 3 times a day as needed for migraine. Active  hydrOXYzine hydrochloride hydrochloride 25 mg oral tablet 1 tab(s) orally 2 times a day Active   Lab Results:  Routine Chem:  02-Feb-16 19:00   B-Type Natriuretic Peptide (  [place])  [phone] (Result(s) reported on 30 Sep 2014 at 07:38PM.)  03-Feb-16 01:17   Glucose, Serum  121  BUN 11  Creatinine (comp) 0.84  Sodium, Serum  116  Potassium, Serum 4.3  Chloride, Serum  85  CO2, Serum 21  Calcium (Total), Serum  7.8  Osmolality (calc) 235  eGFR (African American) >60  eGFR (Non-African American) >60 (eGFR values <80mL/min/1.73 m2 may be an indication of chronic kidney disease (CKD). Calculated eGFR, using the MRDR Study equation, is useful in  patients with stable  renal function. The eGFR calculation will not be reliable in acutely ill patients when serum creatinine is changing rapidly. It is not useful in patients on dialysis. The eGFR calculation may not be applicable to patients at the low and high extremes of body sizes, pregnant women, and vegetarians.)  Result Comment SODIUM - RESULTS VERIFIED BY REPEAT TESTING.  - NOTIFIED OF CRITICAL VALUE  - READ-BACK PROCESS PERFORMED.  - CALLED CRYSTAL [place] ON 10/01/14  - SNJ  Result(s) reported on 01 Oct 2014 at 01:45AM.  Anion Gap 10  Cardiac:  02-Feb-16 16:50   Troponin I 0.04 (0.00-0.05 0.05 ng/mL or less: NEGATIVE  Repeat testing in 3-6 hrs  if clinically indicated. >0.05 ng/mL: POTENTIAL  MYOCARDIAL INJURY. Repeat  testing in 3-6 hrs if  clinically indicated. NOTE: An increase or decrease  of 30% or more on serial  testing suggests a  clinically important change)    20:46   Troponin I 0.04 (0.00-0.05 0.05 ng/mL or less: NEGATIVE  Repeat testing in 3-6 hrs  if clinically indicated. >0.05 ng/mL: POTENTIAL  MYOCARDIAL INJURY. Repeat  testing in 3-6 hrs if  clinically indicated. NOTE: An increase or decrease  of 30% or more on serial  testing suggests a  clinically important change)  CPK-MB, Serum  5.3 (Result(s) reported on 30 Sep 2014 at 09:12PM.)  03-Feb-16 01:17   Troponin I 0.03 (0.00-0.05 0.05 ng/mL or less: NEGATIVE  Repeat testing in 3-6 hrs  if clinically indicated. >0.05 ng/mL: POTENTIAL  MYOCARDIAL INJURY. Repeat  testing in 3-6 hrs if  clinically indicated. NOTE: An increase or decrease  of 30% or more on serial  testing suggests a  clinically important change)  CPK-MB, Serum  4.1 (Result(s) reported on 01 Oct 2014 at 02:04AM.)  Routine Coag:  03-Feb-16 01:17   Prothrombin  34.3 (11.4-15.0 NOTE: New Reference Range  09/26/14)  INR 3.4 (INR reference interval applies to patients on anticoagulant therapy. A single INR therapeutic range for coumarins  is not optimal for all indications; however, the suggested range for most indications is 2.0 - 3.0. Exceptions to the INR Reference Range may include: Prosthetic heart valves, acute myocardial infarction, prevention of myocardial infarction, and combinations of aspirin and anticoagulant. The need for a higher or lower target INR must be assessed individually. Reference: The Pharmacology and Management of the Vitamin K  antagonists: the seventh ACCP Conference on Antithrombotic and Thrombolytic Therapy. UYQIH.4742 Sept:126 (3suppl): [id number]. A HCT value >55% may artifactually increase the PT.  In one study,  the increase was an average of 25%. Reference:  "Effect on Routine and Special Coagulation Testing Values of Citrate Anticoagulant Adjustment in Patients with High HCT Values." American Journal of Clinical Pathology 2006;126:400-405.)  Routine Hem:  02-Feb-16 16:50   Hemoglobin (CBC) 13.5  Hematocrit (CBC) 40.4   EKG:  EKG Interp. by me   Interpretation a-fib with RVR, 128 bpm, right axis deviation, occasional PVC, no significant st/t changes  Radiology Results: XRay:    02-Feb-16 17:21, Chest Portable Single View  Chest Portable Single View   REASON FOR EXAM:    Right chest pain  COMMENTS:       PROCEDURE: DXR - DXR PORTABLE CHEST SINGLE VIEW  - Sep 30 2014  5:21PM     CLINICAL DATA:  Shortness of breath and right-sided chest pain.  Vomiting.    EXAM:  PORTABLE CHEST - 1 VIEW    COMPARISON:  Report of prior study dated 11/20/2001    FINDINGS:  There is cardiomegaly with pulmonary vascular congestion,  interstitial pulmonary edema, and bilateral pleural effusions, left  greater than right.    Previous median sternotomy.  No acute osseous abnormality.     IMPRESSION:  Findings consistent with congestive heart failure with pulmonary  edema, effusions, and cardiomegaly, and pulmonary vascular  congestion.      Electronically Signed    By: Rozetta Nunnery  M.D.    On: 09/30/2014 17:27     Verified By: Larey Seat, M.D.,    Erythromycin: Unknown  Vital Signs/Nurse's Notes: **Vital Signs.:   03-Feb-16 11:29  Vital Signs Type Routine  Temperature Temperature (F) 97.5  Celsius 36.3  Temperature Source oral  Pulse Pulse 118  Respirations Respirations 20  Systolic BP Systolic BP 93  Diastolic BP (mmHg) Diastolic BP (mmHg) 66  Mean BP 75  Pulse Ox % Pulse Ox % 95  Pulse Ox Activity Level  At rest  Oxygen Delivery 2L    Impression 69 year old female with history of rheumatic fever/MVPin childhood leading to severe mitral regurgitation s/p MVR with #34 mm Seguin annuloplasty ring, quadrangular resection of posterior mitral valve leaflets X2 with sliding leaflet annuloplasty, resection of fibrotic chordae tendineae with replacement with artificial Gore-Tex cords to anterior leaflet X2, and ligation of the left atrial appendage in 2005. She also has history of chronic a-fib s/p Cox-Maze procedure in 2012 during the above MVR, non-obstructive CAD (cardiac cath 2005), GR2DD in 2012 (EF 60-65% at that time), history of prior stroke involving the superior cerebellar region, hypothyroidism, depression, and anxiety who presented to [facility] on 09/30/2014 with a one day history of left sided breast pain and esophageal spasm. She has been noting increased dyspnea for the past several weeks. Cardiology was consulted 2/2 CXR showing pulmonary edema and effusions.  1. A-fib with RVR: -She has history of chronic a-fib -Last documented sub-therapeutic INR was 08/13/2014 (1.6), has been therapeutic for >3 weeks straight making her a candidate for DCCV -Rate control currently and discuss DCCV with MD  -Blood pressures are soft in the 90s/60s limiting titration of b-blocker or addition of CCB -Could add digoxin 0.25 mg daily for improved rate control (nl renal function)  2. Dyspnea, likely acute CHF: -Echo pending -Possibly 2/2 the above -IV Lasix 20 mg bid    3. Dysphagia: -GI on board for possible EGD -Will need to hold warfarin until INR <1.5 -PPI  4. Status post MVR: -As above -Check echo  5. Hyponatremia   Plan ATTENDING ATTESTATION I saw & examined the patient earlier this AM with Mr. Idolina Primer, [place]. We reviewed the chart & available data together and discussed the findings, examinatoin & recommendations above.  With chronic Afib - what is not clear is "how long has she been in Afib?" -- I doubt that even if successful DCCV, that we would successfully prevent recurrence. Would probably not need DCCV unless sy deompensates.  Agree with difficule titration of  rate control - dig is a tough choice with borderline renal function.  With pleural effusions & mild dyspnea: recommend supplemental O2 & Incntive spirometer, & diuresis.  I suspect HyponNa+ is most likely hyprevolemic - monitor for abrupt changes.  Will follow up in AM, once echo has been done & read.   Electronic Signatures: Rise Mu (PA-C)  (Signed 03-Feb-16 16:59)  Authored: General Aspect/Present Illness, History and Physical Exam, Review of System, Family & Social History, Past Medical History, Home Medications, Labs, EKG , Radiology, Allergies, Vital Signs/Nurse's Notes, Impression/Plan Leonie Man (MD)  (Signed 03-Feb-16 23:07)  Authored: Impression/Plan  Co-Signer: General Aspect/Present Illness, History and Physical Exam, Review of System, Family & Social History, Past Medical History, Home Medications, Labs, EKG , Radiology, Allergies, Vital Signs/Nurse's Notes, Impression/Plan

## 2014-10-03 NOTE — Progress Notes (Addendum)
Advanced Heart Failure Rounding Note   Subjective:    69 y/o woman with rheumatic heart disease and AF s/p MV ring and Cox-Maze procedure in 2005 transferred from [facility] on 2/5 with acute decompensated HF, rapid AF, altered mental status and hyponatremia. ECHO with EF 10% and moderate RV dysfunction .  Apparently has been profoundly depressed for over 1 year and has not participated much in any home activities. She has been to see psychiatry and tried anti-depressants without much benefit. Dr. Edilia Bo has been concerned about benzo addiction and Klonopin stopped earlier this year. Depression got worse when her 68 y/o mother dies over Thanksgiving. (She ahd been her primary caregiver for > 7 years).   At [facility] had rapid AF/AFL and this was felt to be etiology of acute decompensation. Loaded with digoxin but stopped due to dig level of 3.  CT chest 09/30/14: Large bilateral pleural effusions. No PE +CHF  [facility]red to [facility] for [facility] team to see.   In looking at labs from [facility] serum Na was 115 on 2/2 but on 2/4 reported as 123.  Creatinine 0.88 hgb 12.3. Dig level 3.0    Objective:   Weight Range:  Vital Signs:   Temp:  [98.2 F (36.8 C)] 98.2 F (36.8 C) (02/05 1200) Pulse Rate:  [59-72] 62 (02/05 1430) Resp:  [13-21] 21 (02/05 1500) BP: (101-133)/(43-79) 128/59 mmHg (02/05 1500) SpO2:  [96 %-100 %] 98 % (02/05 1500) Weight:  [59 kg (130 lb 1.1 oz)] 59 kg (130 lb 1.1 oz) (02/05 1200) Last BM Date: 10/02/14  Weight change: Filed Weights   10/03/14 1200  Weight: 59 kg (130 lb 1.1 oz)    Intake/Output:  No intake or output data in the 24 hours ending 10/03/14 1541   Physical Exam: General:  Fatigued and depressed appearing. No resp difficulty HEENT: normal Neck: supple. JVP 7-8. Carotids 2+ bilat; no bruits. No lymphadenopathy or thryomegaly appreciated. Cor: PMI nondisplaced. Irregular rate & rhythm. Prominent s2 Lungs: clear but dull at bases Abdomen: soft, nontender,  nondistended. No hepatosplenomegaly. No bruits or masses. Good bowel sounds. Extremities: no cyanosis, clubbing, rash, tr [place],  Neuro: alert & orientedx3, cranial nerves grossly intact. moves all 4 extremities w/o difficulty. Affect flat  Telemetry: AFL 86     Labs: Basic Metabolic Panel: No results for input(s): NA, K, CL, CO2, GLUCOSE, BUN, CREATININE, CALCIUM, MG, PHOS in the last 168 hours.  Liver Function Tests: No results for input(s): AST, ALT, ALKPHOS, BILITOT, PROT, ALBUMIN in the last 168 hours. No results for input(s): LIPASE, AMYLASE in the last 168 hours. No results for input(s): AMMONIA in the last 168 hours.  CBC: No results for input(s): WBC, NEUTROABS, HGB, HCT, MCV, PLT in the last 168 hours.  Cardiac Enzymes: No results for input(s): CKTOTAL, CKMB, CKMBINDEX, TROPONINI in the last 168 hours.  BNP: BNP (last 3 results) No results for input(s): BNP in the last 8760 hours.  ProBNP (last 3 results) No results for input(s): PROBNP in the last 8760 hours.    Other results:  EKG: pending  Imaging:  No results found.   Medications:     Scheduled Medications: . [START ON 10/04/2014] levothyroxine  50 mcg Oral QAC breakfast  . sodium chloride  3 mL Intravenous Q12H     Infusions:     PRN Medications:  sodium chloride, acetaminophen, docusate sodium, ondansetron (ZOFRAN) IV, sodium chloride   Assessment:   1. Acute systolic HF with biventricular dysfunction LVEF 10% with ?  Cardiogenic shock  2. Rheumatic heart disease with severe MR    --s/p MV ring and MAZE 2005 3. Atrial fibrillation/flutter, duration unknown 4. Hyponatremia 5. Bilateral pleural effusions on CT 6. Acute respiratory failure 7. Digoxin toxicity     --loaded with digoxin at [facility]. Level 3.0 8. Hypothyroidism 9. Depression  Plan/Discussion:    I have reviewed all records from [facility] and also discussed situation with patient and her family at length.  I suspect  recurrent atrial fib/flutter is what led to decompensation and deterioration of LV function but duration is unknown. Initially her rate was poorly controlled but now improved in setting of elevated dig level. Will follow closely but may very well need amio and possible TEE/DC-CV. Will continue coumadin for now unless she needs further procedures.   EF quite low and it sounds like she was in cardiogenic shock at [facility] but on exam volume status not too bad and she is relatively warm. However given profound fatigue and effusions,  I think best plan is to take her for RHC to clearly delineate where she is at and to guide further therapy. Will check repeat CXR and labs. Risks and indications of RHC explained to her and her family.   Lastly, she has had profound depression for more than one year with anhedonia and weight loss. Has seen psychiatry in past but has not helped. Will start Celexa.   She confirms Full Code  The patient is critically ill with multiple organ systems failure and requires high complexity decision making for assessment and support, frequent evaluation and titration of therapies, application of advanced monitoring technologies and extensive interpretation of multiple databases.   Critical Care Time devoted to patient care services described in this note is 35 Minutes.   Length of Stay: 0   Glori Bickers MD 10/03/2014, 3:41 PM  Advanced Heart Failure Team Pager [phone] (M-F; 7a - 4p)  Please contact [facility] for night-coverage after hours (4p -7a ) and weekends on [email or web address]

## 2014-10-03 NOTE — Progress Notes (Signed)
This is an addendum to the history and physical. The patient was originally admitted to [facility] on February 2 with atrial fibrillation with rapid ventricular response and acute heart failure. An echocardiogram was performed which showed:    PROCEDURE: [facility] - ECHO DOPPLER COMPLETE(TRANSTHOR)  - Sep 30 2014  9:16PM   RESULT: Echocardiogram Report  Patient Name:   [name] Date of Exam: 09/30/2014 Medical Rec #:  [id number] [id number]        Custom1: Date of Birth:  [birth year]     Height:       69.0 in Patient Age:    69 years      Weight:       130.0 lb Patient Gender: F             BSA:          1.72 m  Indications: CHF Sonographer:    Arville Go RDCS Referring Phys: Fritzi Mandes, A  Summary:  1. Left ventricular ejection fraction, by visual estimation, is 10%.  2. Severely decreased global left ventricular systolic function.  3. Severely increased left ventricular internal cavity size.  4. Restrictive pattern of LV diastolic filling.  5. Moderately reduced RV systolic function.  6. Severely dilated left atrium.  7. Moderately dilated right atrium.  8. Moderate pleural effusion in the left lateral region.  9. Mild mitral stenosis. 10. Moderate mitral valve regurgitation. 11. Severe thickening and calcification of the anterior and posterior  mitral valve leaflets. Rheumatic deformity of the mitral valve can not be   excluded. 12. Mild tricuspid regurgitation. 2D AND M-MODE MEASUREMENTS (normal ranges within parentheses): Left Ventricle:          Normal IVSd (2D):      0.87 cm (0.7-1.1) LVPWd (2D):     0.89 cm (0.7-1.1) Aorta/LA:                  Normal LVIDd (2D):     6.47 cm (3.4-5.7) Aortic Root (2D): 2.90 cm (2.4-3.7) LVIDs (2D):     6.01 cm           Left Atrium (2D): 6.40 cm (1.9-4.0) LV FS (2D):      7.1 %   (>25%) LV EF (2D):     15.5 %   (>50%)                                   Right Ventricle:                                   RVd (2D): LV SYSTOLIC FUNCTION BY 2D PLANIMETRY  (MOD): EF-A4C View: 56.2 % LV DIASTOLIC FUNCTION: MV Peak E: 1.31 m/s SPECTRAL DOPPLER ANALYSIS (where applicable): Mitral Valve: MV Max Vel:   1.74 m/s MV P1/2 Time: 37.00 msec MV Mean Grad: 4.0 mmHg MV Area, PHT: 5.95 cm Aortic Valve: AoV Max Vel: 0.92 m/s AoV Peak PG: 3.4 mmHg AoV Mean PG: LVOT Vmax: 0.38 m/s LVOT VTI:  LVOT Diameter: 2.10 cm AoV Area, Vmax: 1.41 cm AoV Area, VTI:  AoV Area, Vmn: Tricuspid Valve and [name]/RV Systolic Pressure: TR Max Velocity: 2.28 m/s RA  Pressure: 5 mmHg RVSP/PASP: 25.7 mmHg  PHYSICIAN INTERPRETATION: Left Ventricle: The left ventricular internal cavity size was severely  increased. No evidence of left ventricular hypertrophy. Global LV  systolic function was severely  decreased. Left ventricular ejection   fraction, by visual estimation, is 10%. Spectral Doppler shows  restrictive pattern of LV diastolic filling. Right Ventricle: The right ventricular size is mildly enlarged. Global RV  systolic function is moderately reduced. Left Atrium: The left atrium is severely dilated. Right Atrium: The right atrium is moderately dilated. Pericardium: There is no evidence of pericardial effusion. There is a  moderate pleural effusion in the left lateral region. Mitral Valve: There is severe thickening and calcification of the  anterior and posterior mitral valve leaflets. Mild mitral valve stenosis.  Moderate mitral valve regurgitation is seen. Tricuspid Valve: Mild tricuspid regurgitation is visualized. The  tricuspid regurgitant velocity is 2.28 m/s, and with an assumed right  atrial pressure of 5 mmHg, the estimated right ventricular systolic  pressure is normal at 25.7 mmHg. Aortic Valve: The aortic valve is trileaflet and structurally normal,  with normal leaflet excursion; without any evidence of aortic stenosis or  insufficiency. Pulmonic Valve: Mild pulmonic valve regurgitation. Aorta: The aortic root is normal in size and structure. Venous:  The inferior vena cava was normal sized.  [id number] [name] MD Electronically signed by [id number] [name] MD Signature Date/Time: 10/02/2014/8:48:45 AM  The patient was mostly in atrial flutter with rapid ventricular response with hypotension which limited the ability to use a beta blocker. She was treated with digoxin for rate control. She had severe hyponatremia on presentation which gradually improved. She had significant changes in mental status thought to be due to hyponatremia which improved on the day of transfer. On February 4, hypotension worsened and she was transferred to the ICU where she was started on dobutamine. She was given an additional dose of IV digoxin 0.25 by the hospitalist. Heart rate improved. However, digoxin level returned elevated at 3. Ventricular rate was in the 70s . Clinically, the patient appeared improved with resolution of hypotension. Dobutamine was discontinued. Plan: Stop Digoxin.  Transfer to [facility] for advanced heart failure management. 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 Not clear how long she has been in atrial flutter. Although left atrium is severely dilated, loading with Amiodarone followed by TEE/DCCV is probably the best option. The TEE will also help evaluate the mitral valve.  The patient is still fluid overloaded and requires diuresis. Short-term treatment with an inotrope as needed to facilitate diuresis. She will require a right and left cardiac cath once INR is down. If no improvement, evaluation for destination LVAD therapy is warrnented if patient and family wishes to pursue aggressive therapy.

## 2014-10-03 NOTE — Interval H&P Note (Signed)
History and Physical Interval Note:  10/03/2014 4:27 PM  [name]  has presented today for surgery, with the diagnosis of HEART FAILURE  The various methods of treatment have been discussed with the patient and family. After consideration of risks, benefits and other options for treatment, the patient has consented to  Procedure(s): RIGHT HEART CATH (N/A) as a surgical intervention .  The patient's history has been reviewed, patient examined, no change in status, stable for surgery.  I have reviewed the patient's chart and labs.  Questions were answered to the patient's satisfaction.     Malin Sambrano

## 2014-10-03 NOTE — H&P (View-Only) (Signed)
Advanced Heart Failure Rounding Note   Subjective:    69 y/o woman with rheumatic heart disease and AF s/p MV ring and Cox-Maze procedure in 2005 transferred from [facility] on 2/5 with acute decompensated HF, rapid AF, altered mental status and hyponatremia. ECHO with EF 10% and moderate RV dysfunction .  Apparently has been profoundly depressed for over 1 year and has not participated much in any home activities. She has been to see psychiatry and tried anti-depressants without much benefit. Depression got worse when her 58 y/o mother dies over Thanksgiving. (She ahd been her primary caregiver for > 7 years).   At [facility] had rapid AF and this was felt to be etiology of acute decompensation. Loaded with digoxin but stopped due to dig level of 3.  CT chest 09/30/14: Large bilateral pleural effusions. No PE +CHF  Transferred to [facility] for AHF team to see.   In looking at labs from [facility] serum Na was 115 on 2/2 but on 2/4 reported as 123.  Creatinine 0.88 hgb 12.3. Dig level 3.0    Objective:   Weight Range:  Vital Signs:   Temp:  [98.2 F (36.8 C)] 98.2 F (36.8 C) (02/05 1200) Pulse Rate:  [59-72] 62 (02/05 1430) Resp:  [13-21] 21 (02/05 1500) BP: (101-133)/(43-79) 128/59 mmHg (02/05 1500) SpO2:  [96 %-100 %] 98 % (02/05 1500) Weight:  [59 kg (130 lb 1.1 oz)] 59 kg (130 lb 1.1 oz) (02/05 1200) Last BM Date: 10/02/14  Weight change: Filed Weights   10/03/14 1200  Weight: 59 kg (130 lb 1.1 oz)    Intake/Output:  No intake or output data in the 24 hours ending 10/03/14 1541   Physical Exam: General:  Fatigued and depressed appearing. No resp difficulty HEENT: normal Neck: supple. JVP 7-8. Carotids 2+ bilat; no bruits. No lymphadenopathy or thryomegaly appreciated. Cor: PMI nondisplaced. Irregular rate & rhythm. Prominent s2 Lungs: clear but dull at bases Abdomen: soft, nontender, nondistended. No hepatosplenomegaly. No bruits or masses. Good bowel sounds. Extremities: no  cyanosis, clubbing, rash, tr [place]ar,  Neuro: alert & orientedx3, cranial nerves grossly intact. moves all 4 extremities w/o difficulty. Affect flat  Telemetry: AFL 86     Labs: Basic Metabolic Panel: No results for input(s): NA, K, CL, CO2, GLUCOSE, BUN, CREATININE, CALCIUM, MG, PHOS in the last 168 hours.  Liver Function Tests: No results for input(s): AST, ALT, ALKPHOS, BILITOT, PROT, ALBUMIN in the last 168 hours. No results for input(s): LIPASE, AMYLASE in the last 168 hours. No results for input(s): AMMONIA in the last 168 hours.  CBC: No results for input(s): WBC, NEUTROABS, HGB, HCT, MCV, PLT in the last 168 hours.  Cardiac Enzymes: No results for input(s): CKTOTAL, CKMB, CKMBINDEX, TROPONINI in the last 168 hours.  BNP: BNP (last 3 results) No results for input(s): BNP in the last 8760 hours.  ProBNP (last 3 results) No results for input(s): PROBNP in the last 8760 hours.    Other results:  EKG: pending  Imaging:  No results found.   Medications:     Scheduled Medications: . [START ON 10/04/2014] levothyroxine  50 mcg Oral QAC breakfast  . sodium chloride  3 mL Intravenous Q12H     Infusions:     PRN Medications:  sodium chloride, acetaminophen, docusate sodium, ondansetron (ZOFRAN) IV, sodium chloride   Assessment:   1. Acute systolic HF with biventricular dysfunction LVEF 10% with ? Cardiogenic shock  2. Rheumatic heart disease with severe MR    --  s/p MV ring and MAZE 2005 3. Atrial fibrillation/flutter, duration unknown 4. Hyponatremia 5. Bilateral pleural effusions on CT 6. Acute respiratory failure 7. Digoxin toxicity     --loaded with digoxin at [facility]. Level 3.0 8. Hypothyroidism 9. Depression  Plan/Discussion:    I have reviewed all records from [facility] and also discussed situation with patient and her family at length.  I suspect recurrent atrial fib/flutter is what led to decompensation and deterioration of LV function but  duration is unknown. Initially her rate was poorly controlled but now improved in setting of elevated dig level. Will follow closely but may very well need amio and possible TEE/DC-CV. Will continue coumadin for now unless she needs further procedures.   EF quite low and it sounds like she was in cardiogenic shock at [facility] but on exam volume status not too bad and she is relatively warm. However given profound fatigue and effusions,  I think best plan is to take her for RHC to clearly delineate where she is at and to guide further therapy. Will check repeat CXR and labs. Risks and indications of RHC explained to her and her family.   Lastly, she has had profound depression for more than one year with anhedonia and weight loss. Has seen psychiatry in past but has not helped. Will start Celexa.   She confirms Full Code  The patient is critically ill with multiple organ systems failure and requires high complexity decision making for assessment and support, frequent evaluation and titration of therapies, application of advanced monitoring technologies and extensive interpretation of multiple databases.   Critical Care Time devoted to patient care services described in this note is 35 Minutes.   Length of Stay: 0   Glori Bickers MD 10/03/2014, 3:41 PM  Advanced Heart Failure Team Pager [phone] (M-F; 7a - 4p)  Please contact [facility] for night-coverage after hours (4p -7a ) and weekends on [email or web address]

## 2014-10-04 ENCOUNTER — Encounter (HOSPITAL_COMMUNITY): Payer: Self-pay | Admitting: Internal Medicine

## 2014-10-04 DIAGNOSIS — I483 Typical atrial flutter: Secondary | ICD-10-CM

## 2014-10-04 LAB — BASIC METABOLIC PANEL
ANION GAP: 6 (ref 5–15)
BUN: 6 mg/dL (ref 6–23)
CO2: 28 mmol/L (ref 19–32)
CREATININE: 0.71 mg/dL (ref 0.50–1.10)
Calcium: 8.3 mg/dL — ABNORMAL LOW (ref 8.4–10.5)
Chloride: 101 mmol/L (ref 96–112)
GFR calc Af Amer: >90 mL/min (ref >90)
GFR calc non Af Amer: 85 mL/min — ABNORMAL LOW (ref >90)
GLUCOSE: 80 mg/dL (ref 70–99)
POTASSIUM: 4.2 mmol/L (ref 3.5–5.1)
SODIUM: 135 mmol/L (ref 135–145)

## 2014-10-04 LAB — PROTIME-INR
INR: 1.84 — ABNORMAL HIGH (ref 0.00–1.49)
PROTHROMBIN TIME: 21.4 s — AB (ref 11.6–15.2)

## 2014-10-04 LAB — CARBOXYHEMOGLOBIN
CARBOXYHEMOGLOBIN: 1.5 % (ref 0.5–1.5)
Methemoglobin: 0.8 % (ref 0.0–1.5)
O2 Saturation: 81.8 %
Total hemoglobin: 12.9 g/dL (ref 12.0–16.0)

## 2014-10-04 MED ORDER — ENSURE COMPLETE PO LIQD
237.0000 mL | Freq: Two times a day (BID) | ORAL | Status: DC
Start: 2014-10-04 — End: 2014-10-08
  Administered 2014-10-04 – 2014-10-08 (×8): 237 mL via ORAL

## 2014-10-04 MED ORDER — AMIODARONE HCL IN DEXTROSE 360-4.14 MG/200ML-% IV SOLN
30.0000 mg/h | INTRAVENOUS | Status: DC
  Administered 2014-10-04 – 2014-10-07 (×6): 30 mg/h via INTRAVENOUS
  Filled 2014-10-04 (×16): qty 200

## 2014-10-04 MED ORDER — ALUM & MAG HYDROXIDE-SIMETH 200-200-20 MG/5ML PO SUSP
15.0000 mL | ORAL | Status: DC | PRN
Start: 2014-10-04 — End: 2014-10-04

## 2014-10-04 MED ORDER — WARFARIN SODIUM 7.5 MG PO TABS
7.5000 mg | ORAL_TABLET | Freq: Once | ORAL | Status: AC
Start: 2014-10-04 — End: 2014-10-04
  Administered 2014-10-04: 7.5 mg via ORAL
  Filled 2014-10-04: qty 1

## 2014-10-04 MED ORDER — CANGRELOR TETRASODIUM 50 MG IV SOLR
INTRAVENOUS | Status: AC
  Filled 2014-10-04: qty 50

## 2014-10-04 MED ORDER — AMIODARONE HCL IN DEXTROSE 360-4.14 MG/200ML-% IV SOLN
60.0000 mg/h | INTRAVENOUS | Status: AC
  Administered 2014-10-04: 60 mg/h via INTRAVENOUS
  Filled 2014-10-04 (×2): qty 200

## 2014-10-04 NOTE — Progress Notes (Addendum)
INITIAL NUTRITION ASSESSMENT Pt meets criteria for Moderate MALNUTRITION in the context of Chronic as evidenced by mild muscle and fat wasting . DOCUMENTATION CODES Per approved criteria  -Non-severe (moderate) malnutrition in the context of chronic illness   INTERVENTION: Ensure BID  NUTRITION DIAGNOSIS: Inadequate oral intake related to reduced appetite as evidenced by reportedly only eating 50-75% of her meals and mild muscle/fat loss  Goal: Pt to meet >/= 90% of their estimated nutrition needs   Monitor:  Diet status, oral intake, labs, weight  Reason for Assessment: MST 2  69 y.o. female  Admitting Dx: <principal problem not specified>  ASSESSMENT: 69 y/o woman with rheumatic heart disease and AF s/p MV ring and Cox-Maze procedure in 2005 transferred from [facility] on 2/5 with acute decompensated HF, rapid AF, altered mental status and hyponatremia   Spoke with patient this morning. She says she usually eats pretty good, but recently she has not had much of an appetite. She said she tries to eat half if not a little more of each her meals. She reports low energy level.  Sometimes has vomiting, but not as much recently. Agreeable to trying ensure BID  Nutrition Focused Physical Exam:  Subcutaneous Fat:  Orbital Region: Mild Upper Arm Region: Mild-moderate Thoracic and Lumbar Region: N/A  Muscle:  Temple Region: Mild Clavicle Bone Region: Moderate Clavicle and Acromion Bone Region: Moderate Scapular Bone Region: N/A Dorsal Hand: mild Patellar Region: none Anterior Thigh Region: mild Posterior Calf Region: None  Edema: none    Height: Ht Readings from Last 1 Encounters:  10/03/14 5' 9.5" (1.765 m)    Weight: Wt Readings from Last 1 Encounters:  10/04/14 129 lb 3 oz (58.6 kg)    Ideal Body Weight: 150  % Ideal Body Weight: 86%  Wt Readings from Last 10 Encounters:  10/04/14 129 lb 3 oz (58.6 kg)  01/15/14 133 lb 4 oz (60.442 kg)  01/09/14 128 lb (58.06  kg)  11/28/13 138 lb (62.596 kg)  09/10/12 157 lb (71.215 kg)  05/24/12 156 lb (70.761 kg)  04/18/12 150 lb 8 oz (68.266 kg)  05/12/11 149 lb (67.586 kg)  05/11/11 150 lb 1.9 oz (68.094 kg)  04/13/11 150 lb (68.04 kg)    Usual Body Weight: 159  % Usual Body Weight: 81%  BMI:  Body mass index is 18.81 kg/(m^2).  Estimated Nutritional Needs: Kcal: 1200 Protein: 70-82 Fluid: 1.2 liters  Skin: Dry with nose abrasion  Diet Order: Diet 2 gram sodium  EDUCATION NEEDS: -No education needs identified at this time   Intake/Output Summary (Last 24 hours) at 10/04/14 0807 Last data filed at 10/03/14 2000  Gross per 24 hour  Intake    520 ml  Output    675 ml  Net   -155 ml  No oral intake recorded  Last BM: 2/4  Labs:   Recent Labs Lab 10/03/14 1702 10/03/14 1804 10/04/14 0450  NA 135 132* 135  K 3.7 4.0 4.2  CL 95* 95* 101  CO2  --  30 28  BUN 8 8 6   CREATININE 0.60 0.73 0.71  CALCIUM  --  8.1* 8.3*  MG  --  2.1  --   GLUCOSE 77 75 80    CBG (last 3)  No results for input(s): GLUCAP in the last 72 hours.  Scheduled Meds: . antiseptic oral rinse  7 mL Mouth Rinse BID  . aspirin EC  81 mg Oral Daily  . carvedilol  3.125 mg Oral BID  WC  . citalopram  20 mg Oral Daily  . furosemide  40 mg Oral Daily  . hydrOXYzine  25 mg Oral BID  . levothyroxine  50 mcg Oral QAC breakfast  . lisinopril  5 mg Oral Daily  . mupirocin ointment   Nasal BID  . pantoprazole  40 mg Oral QAC breakfast  . potassium chloride  20 mEq Oral BID  . sodium chloride  10-40 mL Intracatheter Q12H  . Warfarin - Pharmacist Dosing Inpatient   Does not apply q1800    Continuous Infusions:   Past Medical History  Diagnosis Date  . H/O: rheumatic fever     Childhood  . Mitral regurgitation     s/p MV repair  . CVA (cerebral infarction)     h/o superior cerebellar infarct  . Atrial fibrillation     s/p cardioversion  . Hypothyroid   . History of nephrolithiasis   . Asthma   .  Anxiety   . Depression   . Headache(784.0)     Migraine  . Urinary incontinence   . Acne rosacea   . Diverticulosis   . Osteopenia     Past Surgical History  Procedure Laterality Date  . Open heart surgery  2005    Maze  . Mv repair  2005    Maze  . Abdominal hysterectomy  1980's    (no cancer),ovaries present  . Thyroidectomy      partial-no cancer  . Lumbar disc surgery      x 2 distantly  . Cardioversion  06/04/2012    Procedure: CARDIOVERSION;  Surgeon: Carlena Bjornstad, MD;  Location: [facility] ENDOSCOPY;  Service: Cardiovascular;  Laterality: N/A;  . Right heart catheterization N/A 10/03/2014    Procedure: RIGHT HEART CATH;  Surgeon: Jolaine Artist, MD;  Location: [facility] CATH LAB;  Service: Cardiovascular;  Laterality: N/A;    Burtis Junes RD, LDN Nutrition Pager: [phone] 10/04/2014 8:07 AM

## 2014-10-04 NOTE — Progress Notes (Signed)
ANTICOAGULATION CONSULT NOTE - Initial Consult  Pharmacy Consult for warfarin Indication: afib, mitral valve repair  No Known Allergies  Patient Measurements: Height: 5' 9.5" (176.5 cm) Weight: 129 lb 3 oz (58.6 kg) IBW/kg (Calculated) : 67.35   Vital Signs: Temp: 97.4 F (36.3 C) (02/06 1155) Temp Source: Oral (02/06 1155) BP: 73/37 mmHg (02/06 1500) Pulse Rate: 128 (02/06 0848)  Labs:  Recent Labs  10/03/14 1702 10/03/14 1804 10/04/14 0450  HGB 12.6 12.6  --   HCT 37.0 37.0  --   PLT  --  225  --   APTT  --  35  --   LABPROT  --  24.0* 21.4*  INR  --  2.13* 1.84*  CREATININE 0.60 0.73 0.71    Estimated Creatinine Clearance: 60.5 mL/min (by C-G formula based on Cr of 0.71).   Medical History: Past Medical History  Diagnosis Date  . H/O: rheumatic fever     Childhood  . Mitral regurgitation     s/p MV repair  . CVA (cerebral infarction)     h/o superior cerebellar infarct  . Atrial fibrillation     s/p cardioversion  . Hypothyroid   . History of nephrolithiasis   . Asthma   . Anxiety   . Depression   . Headache(784.0)     Migraine  . Urinary incontinence   . Acne rosacea   . Diverticulosis   . Osteopenia     Medications:  Warfarin pta  Assessment: 69 year old female with remote history of non-obstructive CAD (cardiac cath 2005), history of rheumatic fever/MVPin childhood leading to severe mitral regurgitation s/p mitral valve repair.   Patient transferred from [facility] today for aggressive medical management by advanced heart failure team.   Plan to cont coumadin. INR down slightly to 1.84. TEE/DCCV Monday.   Goal of Therapy:  INR 2-3 Monitor platelets by anticoagulation protocol: Yes   Plan:   Coumadin 7.5mg  PO x1 Daily INR  Onnie Boer, PharmD Pager: [phone] 10/04/2014 3:14 PM

## 2014-10-04 NOTE — Progress Notes (Signed)
Dr Aundra Dubin called and informed of pt's BP. Pt's amiodarone drip decreased to 30mg  and RN to continue monitoring. Pt is alert and oriented x3 and no other signs of distress.

## 2014-10-04 NOTE — Progress Notes (Addendum)
Patient ID: [name], female   DOB: 01-27-1946, 69 y.o.   MRN: [id number]   69 y/o woman with rheumatic heart disease and AF s/p MV ring and Cox-Maze procedure in 2005 transferred from [facility] on 2/5 with acute decompensated HF, rapid AF, altered mental status and hyponatremia. ECHO with EF 10% and moderate RV dysfunction .  Apparently has been profoundly depressed for over 1 year and has not participated much in any home activities. She has been to see psychiatry and tried anti-depressants without much benefit. Dr. Edilia Bo has been concerned about benzo addiction and Klonopin stopped earlier this year. Depression got worse when her 30 y/o mother dies over Thanksgiving. (She ahd been her primary caregiver for > 7 years).   At [facility] had rapid AF/AFL and this was felt to be etiology of acute decompensation. Loaded with digoxin but stopped due to dig level of 3.   CT chest 09/30/14: Large bilateral pleural effusions. No PE +CHF  [facility] to [facility] for [facility] team to see.   RHC (2/5):  RA 5 PA 45/12 PCWP 16 CI 2.3  IV Lasix stopped.  This morning, she feels "bad" but denies dyspnea.  HR up to 120s in atrial flutter with SBP in 110s currently.  CVP 6, co-ox 81%.  Scheduled Meds: . antiseptic oral rinse  7 mL Mouth Rinse BID  . carvedilol  3.125 mg Oral BID WC  . citalopram  20 mg Oral Daily  . furosemide  40 mg Oral Daily  . hydrOXYzine  25 mg Oral BID  . levothyroxine  50 mcg Oral QAC breakfast  . lisinopril  5 mg Oral Daily  . mupirocin ointment   Nasal BID  . pantoprazole  40 mg Oral QAC breakfast  . potassium chloride  20 mEq Oral BID  . sodium chloride  10-40 mL Intracatheter Q12H  . Warfarin - Pharmacist Dosing Inpatient   Does not apply q1800   Continuous Infusions:  PRN Meds:.acetaminophen, alum & mag hydroxide-simeth, docusate sodium, metoprolol, morphine injection, ondansetron (ZOFRAN) IV, sodium chloride   Filed Vitals:   10/04/14 0424 10/04/14 0500 10/04/14 0600  10/04/14 0623  BP: 89/44 96/45 89/39    Pulse:      Temp:      TempSrc:      Resp: 13 20 15 22   Height:      Weight:      SpO2: 93% 97% 94% 94%    Intake/Output Summary (Last 24 hours) at 10/04/14 0829 Last data filed at 10/03/14 2000  Gross per 24 hour  Intake    520 ml  Output    675 ml  Net   -155 ml    LABS: Basic Metabolic Panel:  Recent Labs  10/03/14 1804 10/04/14 0450  NA 132* 135  K 4.0 4.2  CL 95* 101  CO2 30 28  GLUCOSE 75 80  BUN 8 6  CREATININE 0.73 0.71  CALCIUM 8.1* 8.3*  MG 2.1  --    Liver Function Tests:  Recent Labs  10/03/14 1804  AST 146*  ALT 205*  ALKPHOS 65  BILITOT 1.4*  PROT 5.1*  ALBUMIN 2.8*   No results for input(s): LIPASE, AMYLASE in the last 72 hours. CBC:  Recent Labs  10/03/14 1702 10/03/14 1804  WBC  --  6.6  HGB 12.6 12.6  HCT 37.0 37.0  MCV  --  95.9  PLT  --  225   Cardiac Enzymes: No results for input(s): CKTOTAL, CKMB, CKMBINDEX, TROPONINI in  the last 72 hours. BNP: Invalid input(s): POCBNP D-Dimer: No results for input(s): DDIMER in the last 72 hours. Hemoglobin A1C: No results for input(s): HGBA1C in the last 72 hours. Fasting Lipid Panel: No results for input(s): CHOL, HDL, LDLCALC, TRIG, CHOLHDL, LDLDIRECT in the last 72 hours. Thyroid Function Tests:  Recent Labs  10/03/14 1805  TSH 0.680   Anemia Panel: No results for input(s): VITAMINB12, FOLATE, FERRITIN, TIBC, IRON, RETICCTPCT in the last 72 hours.  RADIOLOGY: Dg Chest Port 1v Same Day  10/03/2014   CLINICAL DATA:  CHF, shortness of breath  EXAM: PORTABLE CHEST - 1 VIEW SAME DAY  COMPARISON:  10/18/2003  FINDINGS: Cardiomegaly with pulmonary vascular congestion. No frank interstitial edema.  Small bilateral pleural effusions. Patchy left lower lobe opacity, likely atelectasis. No pneumothorax.  Right IJ venous catheter terminates at the cavoatrial junction.  Median sternotomy.  IMPRESSION: Cardiomegaly with pulmonary vascular congestion.  No frank interstitial edema.  Small bilateral pleural effusions.  Patchy left lower lobe opacity, likely atelectasis.   Electronically Signed   By: Julian Hy M.D.   On: 10/03/2014 20:37    PHYSICAL EXAM General: NAD Neck: No JVD, no thyromegaly or thyroid nodule.  Lungs: Decreased breath sounds at bases bilaterally CV: Nondisplaced PMI.  Heart tachy, regular S1/S2, no S3/S4, no murmur.  No peripheral edema.   Abdomen: Soft, nontender, no hepatosplenomegaly, no distention.  Neurologic: Alert and oriented x 3.  Psych: Normal affect. Extremities: No clubbing or cyanosis.   TELEMETRY: Reviewed telemetry pt in atrial flutter HR 120s   Assessment:   1. Acute systolic HF with biventricular dysfunction LVEF 10% with ? Cardiogenic shock  2. Rheumatic heart disease with severe MR  --s/p MV ring and MAZE 2005 3. Atrial fibrillation/flutter, duration unknown 4. Hyponatremia 5. Bilateral pleural effusions on CT 6. Acute respiratory failure 7. Digoxin toxicity   --loaded with digoxin at [facility]. Level 3.0 8. Hypothyroidism: TSH normal this admission 9. Depression 10. Elevated transaminases: suspect shock liver with hypotension.   Plan/Discussion:    I agree that recurrent atrial fib/flutter is what led to decompensation and deterioration of LV function but duration is unknown. Initially her rate was poorly controlled but then improved in setting of elevated dig level => HR is back up again today with atrial flutter in 120s.   - Continue coumadin  - Start amiodarone gtt today.  Will need to maintain NSR given concern for tachy-mediated CMP. - Plan for TEE-guided DCCV Monday.   EF quite low and it sounds like she was in cardiogenic shock at [facility] but on exam volume status not too bad and she is relatively warm.  CVP 6 this morning with co-ox 81%. RHC 2/5 showed normal filling pressures.  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   - Can continue po Lasix, lisinopril,  and Coreg at current doses.  Will not titrate with soft BP.   Pleural effusions small on CXR here.  No thoracentesis for now.   Hyponatremia has resolved.   Lastly, she has had profound depression for more than one year with anhedonia and weight loss. Has seen psychiatry in past but has not helped. Started Celexa.   Loralie Champagne 10/04/2014 8:37 AM

## 2014-10-05 DIAGNOSIS — I484 Atypical atrial flutter: Secondary | ICD-10-CM

## 2014-10-05 LAB — COMPREHENSIVE METABOLIC PANEL
ALBUMIN: 2.7 g/dL — AB (ref 3.5–5.2)
ALT: 140 U/L — ABNORMAL HIGH (ref 0–35)
AST: 72 U/L — ABNORMAL HIGH (ref 0–37)
Alkaline Phosphatase: 63 U/L (ref 39–117)
Anion gap: 4 — ABNORMAL LOW (ref 5–15)
BILIRUBIN TOTAL: 0.8 mg/dL (ref 0.3–1.2)
BUN: 11 mg/dL (ref 6–23)
CALCIUM: 8.3 mg/dL — AB (ref 8.4–10.5)
CHLORIDE: 97 mmol/L (ref 96–112)
CO2: 31 mmol/L (ref 19–32)
Creatinine, Ser: 0.79 mg/dL (ref 0.50–1.10)
GFR calc Af Amer: >90 mL/min (ref >90)
GFR calc non Af Amer: 82 mL/min — ABNORMAL LOW (ref >90)
GLUCOSE: 112 mg/dL — AB (ref 70–99)
Potassium: 4.8 mmol/L (ref 3.5–5.1)
SODIUM: 132 mmol/L — AB (ref 135–145)
Total Protein: 4.7 g/dL — ABNORMAL LOW (ref 6.0–8.3)

## 2014-10-05 LAB — CBC
HCT: 36.6 % (ref 36.0–46.0)
Hemoglobin: 12.2 g/dL (ref 12.0–15.0)
MCH: 32.2 pg (ref 26.0–34.0)
MCHC: 33.3 g/dL (ref 30.0–36.0)
MCV: 96.6 fL (ref 78.0–100.0)
Platelets: 262 10*3/uL (ref 150–400)
RBC: 3.79 MIL/uL — ABNORMAL LOW (ref 3.87–5.11)
RDW: 15.6 % — ABNORMAL HIGH (ref 11.5–15.5)
WBC: 6.3 10*3/uL (ref 4.0–10.5)

## 2014-10-05 LAB — PROTIME-INR
INR: 1.61 — AB (ref 0.00–1.49)
Prothrombin Time: 19.3 seconds — ABNORMAL HIGH (ref 11.6–15.2)

## 2014-10-05 LAB — CARBOXYHEMOGLOBIN
Carboxyhemoglobin: 0.6 % (ref 0.5–1.5)
Methemoglobin: 0.8 % (ref 0.0–1.5)
O2 Saturation: 82.2 %
Total hemoglobin: 12.3 g/dL (ref 12.0–16.0)

## 2014-10-05 LAB — HEPARIN LEVEL (UNFRACTIONATED): Heparin Unfractionated: 0.46 IU/mL (ref 0.30–0.70)

## 2014-10-05 MED ORDER — WARFARIN SODIUM 7.5 MG PO TABS
7.5000 mg | ORAL_TABLET | Freq: Once | ORAL | Status: AC
  Administered 2014-10-05: 7.5 mg via ORAL
  Filled 2014-10-05: qty 1

## 2014-10-05 MED ORDER — SODIUM CHLORIDE 0.9 % IJ SOLN
3.0000 mL | Freq: Two times a day (BID) | INTRAMUSCULAR | Status: DC
  Administered 2014-10-05: 3 mL via INTRAVENOUS

## 2014-10-05 MED ORDER — HEPARIN BOLUS VIA INFUSION
3000.0000 [IU] | Freq: Once | INTRAVENOUS | Status: AC
  Administered 2014-10-05: 3000 [IU] via INTRAVENOUS
  Filled 2014-10-05: qty 3000

## 2014-10-05 MED ORDER — WARFARIN VIDEO
Freq: Once | Status: AC
  Administered 2014-10-05: 16:00:00

## 2014-10-05 MED ORDER — SODIUM CHLORIDE 0.9 % IV SOLN: 250.0000 mL | INTRAVENOUS | Status: DC

## 2014-10-05 MED ORDER — SODIUM CHLORIDE 0.9 % IJ SOLN: 3.0000 mL | INTRAMUSCULAR | Status: DC | PRN

## 2014-10-05 MED ORDER — HEPARIN (PORCINE) IN NACL 100-0.45 UNIT/ML-% IJ SOLN
1100.0000 [IU]/h | INTRAMUSCULAR | Status: DC
  Administered 2014-10-05: 900 [IU]/h via INTRAVENOUS
  Administered 2014-10-07: 1100 [IU]/h via INTRAVENOUS
  Filled 2014-10-05 (×5): qty 250

## 2014-10-05 MED ORDER — LISINOPRIL 2.5 MG PO TABS
2.5000 mg | ORAL_TABLET | Freq: Every day | ORAL | Status: DC
  Administered 2014-10-05 – 2014-10-06 (×2): 2.5 mg via ORAL
  Filled 2014-10-05 (×4): qty 1

## 2014-10-05 NOTE — Progress Notes (Signed)
CRITICAL VALUE ALERT  Critical value received:  Troponin of 0.92  Date of notification:  10/05/14  Time of notification:  5189  Critical value read back:Yes.    Nurse who received alert:  Alvenia Treese  Pt. Post CPR expected value.

## 2014-10-05 NOTE — Progress Notes (Addendum)
ANTICOAGULATION CONSULT NOTE - Initial Consult  Pharmacy Consult for Warfarin and Heparin Indication: atrial fibrillation  No Known Allergies  Patient Measurements: Height: 5' 9.5" (176.5 cm) Weight: 132 lb 0.9 oz (59.9 kg) IBW/kg (Calculated) : 67.35  Vital Signs: Temp: 97.4 F (36.3 C) (02/07 0800) Temp Source: Oral (02/07 0800) BP: 87/48 mmHg (02/07 0805)  Labs:  Recent Labs  10/03/14 1702 10/03/14 1804 10/04/14 0450 10/05/14 0500  HGB 12.6 12.6  --  12.2  HCT 37.0 37.0  --  36.6  PLT  --  225  --  262  APTT  --  35  --   --   LABPROT  --  24.0* 21.4* 19.3*  INR  --  2.13* 1.84* 1.61*  CREATININE 0.60 0.73 0.71 0.79    Estimated Creatinine Clearance: 61.9 mL/min (by C-G formula based on Cr of 0.79).   Medical History: Past Medical History  Diagnosis Date  . H/O: rheumatic fever     Childhood  . Mitral regurgitation     s/p MV repair  . CVA (cerebral infarction)     h/o superior cerebellar infarct  . Atrial fibrillation     s/p cardioversion  . Hypothyroid   . History of nephrolithiasis   . Asthma   . Anxiety   . Depression   . Headache(784.0)     Migraine  . Urinary incontinence   . Acne rosacea   . Diverticulosis   . Osteopenia     Assessment: 69 year old female with remote history of non-obstructive CAD (cardiac cath 2005), history of rheumatic fever/MVPin childhood leading to severe mitral regurgitation s/p mitral valve repair.   Patient transferred from [facility] today for aggressive medical management by advanced heart failure team.   Plan to cont coumadin, will add heparin d/t subtherapeutic INR. INR down to 1.61 today. TEE/DCCV Monday.  Pt PTA warfarin regimen: 5mg  on Tues and Sat, 7.5 mg AOD *Amio added this admission, will need to decr weekly dose if she continues tx w/ amio  Goal of Therapy:  INR 2-3 Heparin level 0.3-0.7 units/ml Monitor platelets by anticoagulation protocol: Yes   Plan:  Heparin bolus 3000 units IV Heparin gtt  at 900 units/hr IV Warfarin 7.5mg  x1 tonight Check 6hr heparin level at 1600 Monitor daily heparin level, INR, CBC, s/sx bleeding  Drucie Opitz, PharmD Clinical Pharmacy Resident Pager: [phone] 10/05/2014 9:21 AM   Addum:  Heparin level therapeutic.  Cont drip at 900 units/hr.  F/u am labs Excell Seltzer, PharmD

## 2014-10-05 NOTE — Progress Notes (Addendum)
Patient ID: [name], female   DOB: August 23, 1946, 69 y.o.   MRN: [id number]   69 y/o woman with rheumatic heart disease and AF s/p MV ring and Cox-Maze procedure in 2005 transferred from [facility] on 2/5 with acute decompensated HF, rapid AF, altered mental status and hyponatremia. ECHO with EF 10% and moderate RV dysfunction .  Apparently has been profoundly depressed for over 1 year and has not participated much in any home activities. She has been to see psychiatry and tried anti-depressants without much benefit. Dr. Lorelei [facility] has been concerned about benzo addiction and Klonopin stopped earlier this year. Depression got worse when her 31 y/o mother dies over Thanksgiving. (She had been her primary caregiver for > 7 years).   At [facility] had rapid AF/AFL and this was felt to be etiology of acute decompensation. Loaded with digoxin but stopped due to dig level of 3.   CT chest 09/30/14: Large bilateral pleural effusions. No PE +CHF  [facility]red [facility] team to see.   RHC (2/5):  RA 5 PA 45/12 PCWP 16 CI 2.3  She was started on amiodarone gtt yesterday, HR now better controlled.  BP remains soft, 80s-90s when awake.  CVP 7, co-ox 82%.  She is now on po Lasix.   Scheduled Meds: . antiseptic oral rinse  7 mL Mouth Rinse BID  . citalopram  20 mg Oral Daily  . feeding supplement (ENSURE COMPLETE)  237 mL Oral BID BM  . furosemide  40 mg Oral Daily  . hydrOXYzine  25 mg Oral BID  . levothyroxine  50 mcg Oral QAC breakfast  . lisinopril  5 mg Oral Daily  . mupirocin ointment   Nasal BID  . pantoprazole  40 mg Oral QAC breakfast  . potassium chloride  20 mEq Oral BID  . sodium chloride  10-40 mL Intracatheter Q12H  . Warfarin - Pharmacist Dosing Inpatient   Does not apply q1800   Continuous Infusions: . amiodarone 30 mg/hr (10/05/14 0250)   PRN Meds:.acetaminophen, alum & mag hydroxide-simeth, docusate sodium, metoprolol, morphine injection, ondansetron (ZOFRAN) IV, sodium  chloride   Filed Vitals:   10/05/14 0500 10/05/14 0600 10/05/14 0800 10/05/14 0805  BP: 83/49  72/45 87/48  Pulse:      Temp:   97.4 F (36.3 C)   TempSrc:   Oral   Resp: 15 18 14 15   Height:      Weight: 132 lb 0.9 oz (59.9 kg)     SpO2: 97% 98% 100% 99%    Intake/Output Summary (Last 24 hours) at 10/05/14 0852 Last data filed at 10/05/14 0800  Gross per 24 hour  Intake 1383.7 ml  Output    775 ml  Net  608.7 ml    LABS: Basic Metabolic Panel:  Recent Labs  10/03/14 1804 10/04/14 0450 10/05/14 0500  NA 132* 135 132*  K 4.0 4.2 4.8  CL 95* 101 97  CO2 30 28 31   GLUCOSE 75 80 112*  BUN 8 6 11   CREATININE 0.73 0.71 0.79  CALCIUM 8.1* 8.3* 8.3*  MG 2.1  --   --    Liver Function Tests:  Recent Labs  10/03/14 1804 10/05/14 0500  AST 146* 72*  ALT 205* 140*  ALKPHOS 65 63  BILITOT 1.4* 0.8  PROT 5.1* 4.7*  ALBUMIN 2.8* 2.7*   No results for input(s): LIPASE, AMYLASE in the last 72 hours. CBC:  Recent Labs  10/03/14 1804 10/05/14 0500  WBC 6.6  6.3  HGB 12.6 12.2  HCT 37.0 36.6  MCV 95.9 96.6  PLT 225 262   Cardiac Enzymes: No results for input(s): CKTOTAL, CKMB, CKMBINDEX, TROPONINI in the last 72 hours. BNP: Invalid input(s): POCBNP D-Dimer: No results for input(s): DDIMER in the last 72 hours. Hemoglobin A1C: No results for input(s): HGBA1C in the last 72 hours. Fasting Lipid Panel: No results for input(s): CHOL, HDL, LDLCALC, TRIG, CHOLHDL, LDLDIRECT in the last 72 hours. Thyroid Function Tests:  Recent Labs  10/03/14 1805  TSH 0.680   Anemia Panel: No results for input(s): VITAMINB12, FOLATE, FERRITIN, TIBC, IRON, RETICCTPCT in the last 72 hours.  RADIOLOGY: Dg Chest Port 1v Same Day  10/03/2014   CLINICAL DATA:  CHF, shortness of breath  EXAM: PORTABLE CHEST - 1 VIEW SAME DAY  COMPARISON:  10/18/2003  FINDINGS: Cardiomegaly with pulmonary vascular congestion. No frank interstitial edema.  Small bilateral pleural effusions. Patchy  left lower lobe opacity, likely atelectasis. No pneumothorax.  Right IJ venous catheter terminates at the cavoatrial junction.  Median sternotomy.  IMPRESSION: Cardiomegaly with pulmonary vascular congestion. No frank interstitial edema.  Small bilateral pleural effusions.  Patchy left lower lobe opacity, likely atelectasis.   Electronically Signed   By: Julian Hy M.D.   On: 10/03/2014 20:37    PHYSICAL EXAM General: NAD Neck: No JVD, no thyromegaly or thyroid nodule.  Lungs: Decreased breath sounds at bases bilaterally CV: Nondisplaced PMI.  Heart irregular S1/S2, no S3/S4, no murmur.  No peripheral edema.   Abdomen: Soft, nontender, no hepatosplenomegaly, no distention.  Neurologic: Alert and oriented x 3.  Psych: Flat affect. Extremities: No clubbing or cyanosis.   TELEMETRY: Reviewed telemetry pt in atypical atrial flutter, HR 60s   Assessment:   1. Acute systolic HF with biventricular dysfunction LVEF 10% with ? Cardiogenic shock  2. Rheumatic heart disease with severe MR  --s/p MV ring and MAZE 2005 3. Atrial fibrillation/flutter, duration unknown 4. Hyponatremia 5. Bilateral pleural effusions on CT 6. Acute respiratory failure 7. Digoxin toxicity   --loaded with digoxin at [facility]. Level 3.0 8. Hypothyroidism: TSH normal this admission 9. Depression 10. Elevated transaminases: suspect shock liver with hypotension.   Plan/Discussion:    I agree that recurrent atrial fib/flutter is what led to decompensation and deterioration of LV function but duration is unknown. Initially her rate was poorly controlled but then improved in setting of elevated dig level => HR is back up again on 2/6 but much better today (60s) after starting amiodarone gtt.   - Continue coumadin, will add heparin gtt since INR 1.6 today.  - Continue amiodarone.  Will need to maintain NSR given concern for tachy-mediated CMP. - Plan for TEE-guided DCCV Monday.   EF quite low and it sounds  like she was in cardiogenic shock at [facility] but on exam volume status not too bad and she is relatively warm.  CVP 7 this morning with co-ox 82%. RHC 2/5 showed normal filling pressures.  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   - Can continue po Lasix - With soft BP, will hold Coreg while loading with IV amiodarone.  Will decrease lisinopril to 2.5 mg daily.   Pleural effusions small on CXR here.  No thoracentesis for now.   Mild hyponatremia, follow.   Follow LFTs to make sure they trend down.   Lastly, she has had profound depression for more than one year with anhedonia and weight loss. Has seen psychiatry in past but has not helped.  Started Celexa.   Loralie Champagne 10/05/2014 8:52 AM

## 2014-10-06 ENCOUNTER — Encounter (HOSPITAL_COMMUNITY)
Admission: AD | Disposition: A | Discharge status: Home / Self Care | Payer: Self-pay | Source: Other Acute Inpatient Hospital | Attending: Internal Medicine

## 2014-10-06 DIAGNOSIS — E440 Moderate protein-calorie malnutrition: Secondary | ICD-10-CM | POA: Insufficient documentation

## 2014-10-06 DIAGNOSIS — F331 Major depressive disorder, recurrent, moderate: Secondary | ICD-10-CM

## 2014-10-06 DIAGNOSIS — I4892 Unspecified atrial flutter: Secondary | ICD-10-CM

## 2014-10-06 LAB — CARBOXYHEMOGLOBIN
Carboxyhemoglobin: 1.4 % (ref 0.5–1.5)
Methemoglobin: 0.7 % (ref 0.0–1.5)
O2 SAT: 82 %
Total hemoglobin: 12.7 g/dL (ref 12.0–16.0)

## 2014-10-06 LAB — CBC
HCT: 37.6 % (ref 36.0–46.0)
Hemoglobin: 12.6 g/dL (ref 12.0–15.0)
MCH: 33.1 pg (ref 26.0–34.0)
MCHC: 33.5 g/dL (ref 30.0–36.0)
MCV: 98.7 fL (ref 78.0–100.0)
Platelets: 263 10*3/uL (ref 150–400)
RBC: 3.81 MIL/uL — ABNORMAL LOW (ref 3.87–5.11)
RDW: 15.9 % — AB (ref 11.5–15.5)
WBC: 6.7 10*3/uL (ref 4.0–10.5)

## 2014-10-06 LAB — HEPARIN LEVEL (UNFRACTIONATED): HEPARIN UNFRACTIONATED: 0.35 [IU]/mL (ref 0.30–0.70)

## 2014-10-06 LAB — COMPREHENSIVE METABOLIC PANEL
ALK PHOS: 60 U/L (ref 39–117)
ALT: 125 U/L — ABNORMAL HIGH (ref 0–35)
ANION GAP: 4 — AB (ref 5–15)
AST: 65 U/L — ABNORMAL HIGH (ref 0–37)
Albumin: 2.7 g/dL — ABNORMAL LOW (ref 3.5–5.2)
BILIRUBIN TOTAL: 0.5 mg/dL (ref 0.3–1.2)
BUN: 12 mg/dL (ref 6–23)
CHLORIDE: 98 mmol/L (ref 96–112)
CO2: 34 mmol/L — AB (ref 19–32)
Calcium: 8.6 mg/dL (ref 8.4–10.5)
Creatinine, Ser: 0.82 mg/dL (ref 0.50–1.10)
GFR calc Af Amer: 82 mL/min — ABNORMAL LOW (ref >90)
GFR, EST NON AFRICAN AMERICAN: 71 mL/min — AB (ref >90)
Glucose, Bld: 109 mg/dL — ABNORMAL HIGH (ref 70–99)
Potassium: 4.5 mmol/L (ref 3.5–5.1)
Sodium: 136 mmol/L (ref 135–145)
Total Protein: 5 g/dL — ABNORMAL LOW (ref 6.0–8.3)

## 2014-10-06 LAB — PROTIME-INR
INR: 1.62 — AB (ref 0.00–1.49)
PROTHROMBIN TIME: 19.4 s — AB (ref 11.6–15.2)

## 2014-10-06 SURGERY — ECHOCARDIOGRAM, TRANSESOPHAGEAL
Anesthesia: Monitor Anesthesia Care

## 2014-10-06 MED ORDER — WARFARIN SODIUM 10 MG PO TABS
10.0000 mg | ORAL_TABLET | Freq: Once | ORAL | Status: AC
  Administered 2014-10-06: 10 mg via ORAL
  Filled 2014-10-06: qty 1

## 2014-10-06 MED ORDER — SODIUM CHLORIDE 0.9 % IV SOLN
INTRAVENOUS | Status: DC
  Administered 2014-10-07: 08:00:00 via INTRAVENOUS

## 2014-10-06 NOTE — Care Management Note (Addendum)
    Page 1 of 1   10/08/2014     9:27:27 AM CARE MANAGEMENT NOTE 10/08/2014  Patient:  [name], [name]   Account Number:  [id number]  Date Initiated:  10/06/2014  Documentation initiated by:  Elissa Hefty  Subjective/Objective Assessment:   adm w heart failure     Action/Plan:   lives w husband, pcp dr Frederico Hamman [place]   Anticipated DC Date:  10/08/2014   Anticipated DC Plan:  HOME/SELF CARE         Choice offered to / List presented to:          [facility] arranged  [place] - 11 Patient Refused      Status of service:   Medicare Important Message given?  YES (If response is "NO", the following Medicare IM given date fields will be blank) Date Medicare IM given:  10/06/2014 Medicare IM given by:  Elissa Hefty Date Additional Medicare IM given:   Additional Medicare IM given by:    Discharge Disposition:  HOME/SELF CARE  Per UR Regulation:  Reviewed for med. necessity/level of care/duration of stay  If discussed at [place] of Stay Meetings, dates discussed:    Comments:  2/10  0926 debbie Armistead Sult rn,bsn spoke w pt. she does not want [facility]. she plans to follow up w md in office.  2/8 0857 debbie Nayquan Evinger rn,bsn will moniter for dc needs as pt progresses.

## 2014-10-06 NOTE — Progress Notes (Signed)
CARDIAC REHAB PHASE I   PRE:  Rate/Rhythm: 93 Afib  BP:  Supine: 95/56  Sitting:   Standing:    SaO2: 98 RA  MODE:  Ambulation: 170 ft   POST:  Rate/Rhythm: 98 Afib  BP:  Supine:   Sitting: 108/60  Standing:    SaO2: 98 RA 1125-1207 Assisted X 1 to ambulate. Gait a little wobbly. Pt c/o of feeling weak. She was able to walk 170 feet. VS stable Pt to recliner after walk with call light in reach and family present. Pt has a very flat affect, wants her room dark. I encouraged her to stay up in the chair and out of bed most of the the day to help her get her strength back. Pt's husband and son are very supportive. We will continue to follow pt.  Rodney Langton RN 10/06/2014 12:06 PM

## 2014-10-06 NOTE — Progress Notes (Addendum)
[place] for Warfarin and Heparin Indication: atrial fibrillation  No Known Allergies  Patient Measurements: Height: 5' 9.5" (176.5 cm) Weight: 132 lb 7.9 oz (60.1 kg) IBW/kg (Calculated) : 67.35  Vital Signs: Temp: 97.5 F (36.4 C) (02/08 0500) Temp Source: Oral (02/08 0500) BP: 82/53 mmHg (02/08 0556) Pulse Rate: 108 (02/08 0500)  Labs:  Recent Labs  10/03/14 1804 10/04/14 0450 10/05/14 0500 10/05/14 1600 10/06/14 0510  HGB 12.6  --  12.2  --  12.6  HCT 37.0  --  36.6  --  37.6  PLT 225  --  262  --  263  APTT 35  --   --   --   --   LABPROT 24.0* 21.4* 19.3*  --  19.4*  INR 2.13* 1.84* 1.61*  --  1.62*  HEPARINUNFRC  --   --   --  0.46 0.35  CREATININE 0.73 0.71 0.79  --  0.82    Estimated Creatinine Clearance: 60.6 mL/min (by C-G formula based on Cr of 0.82).   Medical History: Past Medical History  Diagnosis Date  . H/O: rheumatic fever     Childhood  . Mitral regurgitation     s/p MV repair  . CVA (cerebral infarction)     h/o superior cerebellar infarct  . Atrial fibrillation     s/p cardioversion  . Hypothyroid   . History of nephrolithiasis   . Asthma   . Anxiety   . Depression   . Headache(784.0)     Migraine  . Urinary incontinence   . Acne rosacea   . Diverticulosis   . Osteopenia     Assessment: 69 year old female with remote history of non-obstructive CAD (cardiac cath 2005), history of rheumatic fever/MVPin childhood leading to severe mitral regurgitation s/p mitral valve repair.   Patient transferred from [facility] today for aggressive medical management by advanced heart failure team.   Plan to cont coumadin, will add heparin d/t subtherapeutic INR. INR unchanged today. TEE/DCCV Monday. Heparin continues to be at goal on 900 units/hr.  Pt PTA warfarin regimen: 5mg  on Tues and Sat, 7.5 mg AOD *Amio added this admission, will need to decr weekly dose if she continues tx w/ amio  Goal of  Therapy:  INR 2-3 Heparin level 0.3-0.7 units/ml Monitor platelets by anticoagulation protocol: Yes   Plan:  Heparin gtt at 900 units/hr IV Warfarin 10mg  x1 tonight Monitor daily heparin level, INR, CBC, s/sx bleeding  Erin Hearing PharmD., BCPS Clinical Pharmacist Pager [phone] 10/06/2014 7:18 AM

## 2014-10-06 NOTE — Progress Notes (Signed)
Patient ID: [name], female   DOB: 06-Jun-1946, 69 y.o.   MRN: [id number]   69 y/o woman with rheumatic heart disease and AF s/p MV ring and Cox-Maze procedure in 2005 transferred from [facility] on 2/5 with acute decompensated HF, rapid AF, altered mental status and hyponatremia. ECHO with EF 10% and moderate RV dysfunction .  Apparently has been profoundly depressed for over 1 year and has not participated much in any home activities. She has been to see psychiatry and tried anti-depressants without much benefit. Dr. Lorelei Pont has been concerned about benzo addiction and Klonopin stopped earlier this year. Depression got worse when her 68 y/o mother dies over Thanksgiving. (She had been her primary caregiver for > 7 years).   At [facility] had rapid AF/AFL and this was felt to be etiology of acute decompensation. Loaded with digoxin but stopped due to dig level of 3. Chnged to [employer].   CT chest 09/30/14: Large bilateral pleural effusions. No PE +CHF  [facility]red to [facility] for [facility] team to see.   RHC (2/5):  RA 5 PA 45/12 PCWP 16 CI 2.3  Remains on amiodarone drip at 30 mg per hour. Yesterday lisinopril cut back to 2.5 mg daily due to low BP. On po lasix with weight unchanged. Denies SOB/Orthopnea. Co-ox 82%    Scheduled Meds: . antiseptic oral rinse  7 mL Mouth Rinse BID  . citalopram  20 mg Oral Daily  . feeding supplement (ENSURE COMPLETE)  237 mL Oral BID BM  . furosemide  40 mg Oral Daily  . hydrOXYzine  25 mg Oral BID  . levothyroxine  50 mcg Oral QAC breakfast  . lisinopril  2.5 mg Oral Daily  . mupirocin ointment   Nasal BID  . pantoprazole  40 mg Oral QAC breakfast  . potassium chloride  20 mEq Oral BID  . sodium chloride  10-40 mL Intracatheter Q12H  . Warfarin - Pharmacist Dosing Inpatient   Does not apply q1800   Continuous Infusions: . amiodarone 30 mg/hr (10/05/14 1716)  . heparin 900 Units/hr (10/05/14 1008)   PRN Meds:.acetaminophen, alum & mag hydroxide-simeth,  docusate sodium, metoprolol, morphine injection, ondansetron (ZOFRAN) IV, sodium chloride   Filed Vitals:   10/05/14 2329 10/05/14 2330 10/06/14 0500 10/06/14 0556  BP:  133/114  82/53  Pulse:  94 108   Temp: 97.9 F (36.6 C)  97.5 F (36.4 C)   TempSrc: Oral  Oral   Resp:  22 16   Height:      Weight:   132 lb 7.9 oz (60.1 kg)   SpO2:  95% 95%     Intake/Output Summary (Last 24 hours) at 10/06/14 0707 Last data filed at 10/06/14 0600  Gross per 24 hour  Intake  982.9 ml  Output    400 ml  Net  582.9 ml    LABS: Basic Metabolic Panel:  Recent Labs  10/03/14 1804  10/05/14 0500 10/06/14 0510  NA 132*  < > 132* 136  K 4.0  < > 4.8 4.5  CL 95*  < > 97 98  CO2 30  < > 31 34*  GLUCOSE 75  < > 112* 109*  BUN 8  < > 11 12  CREATININE 0.73  < > 0.79 0.82  CALCIUM 8.1*  < > 8.3* 8.6  MG 2.1  --   --   --   < > = values in this interval not displayed. Liver Function Tests:  Recent Labs  10/05/14  0500 10/06/14 0510  AST 72* 65*  ALT 140* 125*  ALKPHOS 63 60  BILITOT 0.8 0.5  PROT 4.7* 5.0*  ALBUMIN 2.7* 2.7*   No results for input(s): LIPASE, AMYLASE in the last 72 hours. CBC:  Recent Labs  10/05/14 0500 10/06/14 0510  WBC 6.3 6.7  HGB 12.2 12.6  HCT 36.6 37.6  MCV 96.6 98.7  PLT 262 263   Cardiac Enzymes: No results for input(s): CKTOTAL, CKMB, CKMBINDEX, TROPONINI in the last 72 hours. BNP: Invalid input(s): POCBNP D-Dimer: No results for input(s): DDIMER in the last 72 hours. Hemoglobin A1C: No results for input(s): HGBA1C in the last 72 hours. Fasting Lipid Panel: No results for input(s): CHOL, HDL, LDLCALC, TRIG, CHOLHDL, LDLDIRECT in the last 72 hours. Thyroid Function Tests:  Recent Labs  10/03/14 1805  TSH 0.680   Anemia Panel: No results for input(s): VITAMINB12, FOLATE, FERRITIN, TIBC, IRON, RETICCTPCT in the last 72 hours.  RADIOLOGY: Dg Chest Port 1v Same Day  10/03/2014   CLINICAL DATA:  CHF, shortness of breath  EXAM:  PORTABLE CHEST - 1 VIEW SAME DAY  COMPARISON:  10/18/2003  FINDINGS: Cardiomegaly with pulmonary vascular congestion. No frank interstitial edema.  Small bilateral pleural effusions. Patchy left lower lobe opacity, likely atelectasis. No pneumothorax.  Right IJ venous catheter terminates at the cavoatrial junction.  Median sternotomy.  IMPRESSION: Cardiomegaly with pulmonary vascular congestion. No frank interstitial edema.  Small bilateral pleural effusions.  Patchy left lower lobe opacity, likely atelectasis.   Electronically Signed   By: Julian Hy M.D.   On: 10/03/2014 20:37    PHYSICAL EXAM General: NAD Neck: No JVD, no thyromegaly or thyroid nodule.  Lungs: Decreased breath sounds at bases bilaterally CV: Nondisplaced PMI.  Heart irregular S1/S2, no S3/S4, no murmur.  No peripheral edema.   Abdomen: Soft, nontender, no hepatosplenomegaly, no distention.  Neurologic: Alert and oriented x 3.  Psych: Flat affect. Extremities: No clubbing or cyanosis.   TELEMETRY: Atrial fib 70s  Assessment:   1. Acute systolic HF with biventricular dysfunction LVEF 10% with ? Cardiogenic shock  2. Rheumatic heart disease with severe MR  --s/p MV ring and MAZE 2005 3. Atrial fibrillation/flutter, duration unknown 4. Hyponatremia 5. Bilateral pleural effusions on CT 6. Acute respiratory failure 7. Digoxin toxicity   --loaded with digoxin at [facility]. Level 3.0 8. Hypothyroidism: TSH normal this admission 9. Depression 10. Elevated transaminases: suspect shock liver with hypotension.   Plan/Discussion:    Suspect atrial fib/flutter is what led to decompensation and deterioration of LV function but duration is unknown. Initially her rate was poorly controlled but then improved in setting of elevated dig level => HR is back up again on 2/6 but much better today  after starting amiodarone gtt.   - Continue coumadin, On heparin gtt. INR 1.6 today.  - remains in A fib. Continue amiodarone.   Possible  TEE-guided DCCV today.    EF low and it sounds like she was in cardiogenic shock at [facility]. RHC 2/5 showed normal filling pressures.  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  Volume status stable. Continue po lasix.    Hold off on bb. Continue lisinopril to 2.5 mg daily.   LFtTs trending down.  Continue celexa for depression.   Consult cardiac rehab.  CLEGG,AMY NP-C  10/06/2014 7:07 AM  Patient seen and examined with Darrick Grinder, NP. We discussed all aspects of the encounter. I agree with the assessment and plan as stated above.  Hemodynamics stable. For Tee/DC-CV today. Continue heparin and coumadin. BP too soft to titrate HF meds.   Benay Spice 9:38 AM

## 2014-10-07 ENCOUNTER — Encounter (HOSPITAL_COMMUNITY): Payer: Self-pay | Admitting: Anesthesiology

## 2014-10-07 ENCOUNTER — Inpatient Hospital Stay (HOSPITAL_COMMUNITY): Payer: Medicare Other | Admitting: Anesthesiology

## 2014-10-07 ENCOUNTER — Encounter (HOSPITAL_COMMUNITY)
Admission: AD | Disposition: A | Discharge status: Home / Self Care | Payer: Self-pay | Source: Other Acute Inpatient Hospital | Attending: Internal Medicine

## 2014-10-07 DIAGNOSIS — I4892 Unspecified atrial flutter: Secondary | ICD-10-CM | POA: Diagnosis present

## 2014-10-07 DIAGNOSIS — I361 Nonrheumatic tricuspid (valve) insufficiency: Secondary | ICD-10-CM

## 2014-10-07 HISTORY — PX: TEE WITHOUT CARDIOVERSION: SHX5443

## 2014-10-07 HISTORY — PX: CARDIOVERSION: SHX1299

## 2014-10-07 LAB — HEPARIN LEVEL (UNFRACTIONATED)
Heparin Unfractionated: 0.13 IU/mL — ABNORMAL LOW (ref 0.30–0.70)
Heparin Unfractionated: 0.31 IU/mL (ref 0.30–0.70)

## 2014-10-07 LAB — BASIC METABOLIC PANEL
ANION GAP: 11 (ref 5–15)
BUN: 21 mg/dL (ref 6–23)
CALCIUM: 8.8 mg/dL (ref 8.4–10.5)
CHLORIDE: 100 mmol/L (ref 96–112)
CO2: 22 mmol/L (ref 19–32)
CREATININE: 0.87 mg/dL (ref 0.50–1.10)
GFR calc non Af Amer: 66 mL/min — ABNORMAL LOW (ref >90)
GFR, EST AFRICAN AMERICAN: 76 mL/min — AB (ref >90)
Glucose, Bld: 89 mg/dL (ref 70–99)
Potassium: 5.1 mmol/L (ref 3.5–5.1)
Sodium: 133 mmol/L — ABNORMAL LOW (ref 135–145)

## 2014-10-07 LAB — CARBOXYHEMOGLOBIN
Carboxyhemoglobin: 1 % (ref 0.5–1.5)
METHEMOGLOBIN: 0.8 % (ref 0.0–1.5)
O2 Saturation: 61.8 %
Total hemoglobin: 13 g/dL (ref 12.0–16.0)

## 2014-10-07 LAB — PROTIME-INR
INR: 1.73 — ABNORMAL HIGH (ref 0.00–1.49)
Prothrombin Time: 20.4 seconds — ABNORMAL HIGH (ref 11.6–15.2)

## 2014-10-07 SURGERY — ECHOCARDIOGRAM, TRANSESOPHAGEAL
Anesthesia: Monitor Anesthesia Care

## 2014-10-07 MED ORDER — PROPOFOL INFUSION 10 MG/ML OPTIME
INTRAVENOUS | Status: DC | PRN
  Administered 2014-10-07: 75 ug/kg/min via INTRAVENOUS

## 2014-10-07 MED ORDER — WARFARIN SODIUM 10 MG PO TABS
10.0000 mg | ORAL_TABLET | Freq: Once | ORAL | Status: AC
  Administered 2014-10-07: 10 mg via ORAL
  Filled 2014-10-07: qty 1

## 2014-10-07 MED ORDER — HYDROMORPHONE HCL 1 MG/ML IJ SOLN: 0.2500 mg | INTRAMUSCULAR | Status: DC | PRN

## 2014-10-07 MED ORDER — PROPOFOL 10 MG/ML IV BOLUS
INTRAVENOUS | Status: DC | PRN
  Administered 2014-10-07: 40 mg via INTRAVENOUS

## 2014-10-07 MED ORDER — PHENYLEPHRINE HCL 10 MG/ML IJ SOLN
INTRAMUSCULAR | Status: DC | PRN
  Administered 2014-10-07 (×5): 40 ug via INTRAVENOUS

## 2014-10-07 MED ORDER — ONDANSETRON HCL 4 MG/2ML IJ SOLN: 4.0000 mg | Freq: Once | INTRAMUSCULAR | Status: AC | PRN

## 2014-10-07 NOTE — Progress Notes (Signed)
[place] for Warfarin and Heparin Indication: atrial fibrillation  No Known Allergies  Patient Measurements: Height: 5' 9.5" (176.5 cm) Weight: 130 lb 4.7 oz (59.1 kg) IBW/kg (Calculated) : 67.35  Vital Signs: Temp: 97.9 F (36.6 C) (02/09 1200) Temp Source: Oral (02/09 1200) BP: 112/62 mmHg (02/09 1000) Pulse Rate: 78 (02/09 1000)  Labs:  Recent Labs  10/05/14 0500  10/06/14 0510 10/07/14 0316 10/07/14 1221  HGB 12.2  --  12.6  --   --   HCT 36.6  --  37.6  --   --   PLT 262  --  263  --   --   LABPROT 19.3*  --  19.4* 20.4*  --   INR 1.61*  --  1.62* 1.73*  --   HEPARINUNFRC  --   < > 0.35 0.13* 0.31  CREATININE 0.79  --  0.82 0.87  --   < > = values in this interval not displayed.  Estimated Creatinine Clearance: 56.1 mL/min (by C-G formula based on Cr of 0.87).   Medical History: Past Medical History  Diagnosis Date  . H/O: rheumatic fever     Childhood  . Mitral regurgitation     s/p MV repair  . CVA (cerebral infarction)     h/o superior cerebellar infarct  . Atrial fibrillation     s/p cardioversion  . Hypothyroid   . History of nephrolithiasis   . Asthma   . Anxiety   . Depression   . Headache(784.0)     Migraine  . Urinary incontinence   . Acne rosacea   . Diverticulosis   . Osteopenia     Assessment: 69 year old female with remote history of non-obstructive CAD (cardiac cath 2005), history of rheumatic fever/MVPin childhood leading to severe mitral regurgitation s/p mitral valve repair.   Patient transferred from [facility] today for aggressive medical management by advanced heart failure team.   Plan to cont coumadin, will add heparin d/t subtherapeutic INR. INR still low at 1.7 today. TEE/DCCV successful. Heparin now at goal on 1050 units/hr.   Pt PTA warfarin regimen: 5mg  on Tues and Sat, 7.5 mg AOD *Amio added this admission, will need to decr weekly dose if she continues tx w/ amio  Goal of  Therapy:  INR 2-3 Heparin level 0.3-0.7 units/ml Monitor platelets by anticoagulation protocol: Yes   Plan:  Increase Heparin gtt to 1100 units/hr  Warfarin 10mg  x1 tonight Monitor daily heparin level, INR, CBC, s/sx bleeding  Erin Hearing PharmD., BCPS Clinical Pharmacist Pager [phone] 10/07/2014 2:09 PM

## 2014-10-07 NOTE — CV Procedure (Signed)
   Electrical Cardioversion Procedure Note JASNEET SCHOBERT [id number] 1946/02/18  Procedure: Electrical Cardioversion Indications:  Atrial Flutter  Time Out: Verified patient identification, verified procedure,medications/allergies/relevent history reviewed, required imaging and test results available.  Performed  Procedure Details  The patient was NPO after midnight. Anesthesia was administered at the beside  by Dr.Smith with 200mg  of propofol (given during TEE).  Cardioversion was done with synchronized biphasic defibrillation with AP pads with 150watts.  The patient converted to normal sinus rhythm. The patient tolerated the procedure well   IMPRESSION:  Successful cardioversion of atrial flutter    Kaleeyah Cuffie R 10/07/2014, 8:36 AM

## 2014-10-07 NOTE — H&P (View-Only) (Signed)
Patient ID: [name], female   DOB: 1946-08-20, 70 y.o.   MRN: [id number]   69 y/o woman with rheumatic heart disease and AF s/p MV ring and Cox-Maze procedure in 2005 transferred from [facility] on 2/5 with acute decompensated HF, rapid AF, altered mental status and hyponatremia. ECHO with EF 10% and moderate RV dysfunction .  Apparently has been profoundly depressed for over 1 year and has not participated much in any home activities. She has been to see psychiatry and tried anti-depressants without much benefit. Dr. Lorelei Pont has been concerned about benzo addiction and Klonopin stopped earlier this year. Depression got worse when her 33 y/o mother dies over Thanksgiving. (She had been her primary caregiver for > 7 years).   At [facility] had rapid AF/AFL and this was felt to be etiology of acute decompensation. Loaded with digoxin but stopped due to dig level of 3. Changed to amio.   CT chest 09/30/14: Large bilateral pleural effusions. No PE +CHF  [facility]red [facility]HF team to see.   RHC (2/5):  RA 5 PA 45/12 PCWP 16 CI 2.3  Remains on amiodarone drip at 30 mg per hour. Lsinopril cut back to 2.5 mg daily due to low BP. SBP remains in 70-80s. On po lasix with weight down 2 pounds. Denies dyspnea. Co-ox 62%. CVP 9. For TEE/DC-CV today    Scheduled Meds: . antiseptic oral rinse  7 mL Mouth Rinse BID  . citalopram  20 mg Oral Daily  . feeding supplement (ENSURE COMPLETE)  237 mL Oral BID BM  . furosemide  40 mg Oral Daily  . hydrOXYzine  25 mg Oral BID  . levothyroxine  50 mcg Oral QAC breakfast  . lisinopril  2.5 mg Oral Daily  . mupirocin ointment   Nasal BID  . pantoprazole  40 mg Oral QAC breakfast  . potassium chloride  20 mEq Oral BID  . sodium chloride  10-40 mL Intracatheter Q12H  . warfarin  10 mg Oral ONCE-1800  . Warfarin - Pharmacist Dosing Inpatient   Does not apply q1800   Continuous Infusions: . sodium chloride    . amiodarone 30 mg/hr (10/06/14 2000)  .  heparin 1,050 Units/hr (10/07/14 0604)   PRN Meds:.acetaminophen, alum & mag hydroxide-simeth, docusate sodium, metoprolol, morphine injection, ondansetron (ZOFRAN) IV, sodium chloride   Filed Vitals:   10/07/14 0200 10/07/14 0400 10/07/14 0500 10/07/14 0601  BP: 84/62 83/62  84/58  Pulse: 90 82  79  Temp:   98.3 F (36.8 C)   TempSrc:   Oral   Resp: 20   18  Height:      Weight:   59.1 kg (130 lb 4.7 oz)   SpO2: 95% 96%  95%    Intake/Output Summary (Last 24 hours) at 10/07/14 0626 Last data filed at 10/07/14 0400  Gross per 24 hour  Intake  806.4 ml  Output      0 ml  Net  806.4 ml    LABS: Basic Metabolic Panel:  Recent Labs  10/06/14 0510 10/07/14 0316  NA 136 133*  K 4.5 5.1  CL 98 100  CO2 34* 22  GLUCOSE 109* 89  BUN 12 21  CREATININE 0.82 0.87  CALCIUM 8.6 8.8   Liver Function Tests:  Recent Labs  10/05/14 0500 10/06/14 0510  AST 72* 65*  ALT 140* 125*  ALKPHOS 63 60  BILITOT 0.8 0.5  PROT 4.7* 5.0*  ALBUMIN 2.7* 2.7*   No results for  input(s): LIPASE, AMYLASE in the last 72 hours. CBC:  Recent Labs  10/05/14 0500 10/06/14 0510  WBC 6.3 6.7  HGB 12.2 12.6  HCT 36.6 37.6  MCV 96.6 98.7  PLT 262 263   Cardiac Enzymes: No results for input(s): CKTOTAL, CKMB, CKMBINDEX, TROPONINI in the last 72 hours. BNP: Invalid input(s): POCBNP D-Dimer: No results for input(s): DDIMER in the last 72 hours. Hemoglobin A1C: No results for input(s): HGBA1C in the last 72 hours. Fasting Lipid Panel: No results for input(s): CHOL, HDL, LDLCALC, TRIG, CHOLHDL, LDLDIRECT in the last 72 hours. Thyroid Function Tests: No results for input(s): TSH, T4TOTAL, T3FREE, THYROIDAB in the last 72 hours.  Invalid input(s): FREET3 Anemia Panel: No results for input(s): VITAMINB12, FOLATE, FERRITIN, TIBC, IRON, RETICCTPCT in the last 72 hours.  RADIOLOGY: Dg Chest Port 1v Same Day  10/03/2014   CLINICAL DATA:  CHF, shortness of breath  EXAM: PORTABLE CHEST - 1  VIEW SAME DAY  COMPARISON:  10/18/2003  FINDINGS: Cardiomegaly with pulmonary vascular congestion. No frank interstitial edema.  Small bilateral pleural effusions. Patchy left lower lobe opacity, likely atelectasis. No pneumothorax.  Right IJ venous catheter terminates at the cavoatrial junction.  Median sternotomy.  IMPRESSION: Cardiomegaly with pulmonary vascular congestion. No frank interstitial edema.  Small bilateral pleural effusions.  Patchy left lower lobe opacity, likely atelectasis.   Electronically Signed   By: Julian Hy M.D.   On: 10/03/2014 20:37    PHYSICAL EXAM General: NAD. Lying flat in bed  Neck: No JVD, no thyromegaly or thyroid nodule.  Lungs: Decreased breath sounds at bases bilaterally CV: Nondisplaced PMI.  Heart irregular S1/S2, no S3/S4, no murmur.  No peripheral edema.   Abdomen: Soft, nontender, no hepatosplenomegaly, no distention.  Neurologic: Alert and oriented x 3.  Psych: Flat affect. Extremities: No clubbing or cyanosis.   TELEMETRY: Atrial fib 70-80s  Assessment:   1. Acute systolic HF with biventricular dysfunction LVEF 10% with ? Cardiogenic shock  2. Rheumatic heart disease with severe MR  --s/p MV ring and MAZE 2005 3. Atrial fibrillation/flutter, duration unknown 4. Hyponatremia 5. Bilateral pleural effusions on CT 6. Acute respiratory failure 7. Digoxin toxicity   --loaded with digoxin at [facility]. Level 3.0 8. Hypothyroidism: TSH normal this admission 9. Depression 10. Elevated transaminases: suspect shock liver with hypotension.   Plan/Discussion:    Suspect atrial fib/flutter is what led to decompensation and deterioration of LV function but duration is unknown. Initially her rate was poorly controlled but then improved in setting of elevated dig level => HR back up again on 2/6 but much better  after starting amiodarone gtt.   - Continue coumadin, On heparin gtt. INR 1.7 today.  - remains in A fib. Continue amiodarone.  TEE-guided DCCV today.    EF low and it sounds like she was in cardiogenic shock at [facility]. RHC 2/5 showed normal filling pressures.  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  Volume status stable. Continue po lasix.  Stop lisinopril and hold off on bb due to low BP.  Continue celexa for depression.   Cardiac rehab seeing.   Glori Bickers MD  10/07/2014 6:26 AM

## 2014-10-07 NOTE — CV Procedure (Signed)
PROCEDURE NOTE  Procedure:  Transesophageal echocardiogram with colorflow doppler Operator:  Fransico Him, MD Indications:  Atrial flutter with CHF Complications: None IV Meds: 200mg  Propofol, Neosynephrine 256mcg  Results: Severely dilated LV with severe LV dysfunction EF 10% Moderately reduced RV systolic function Severely dilated left atrium with mild spontaneous echo contrast.  LA appendage appears oversewn from prior surgery with very small residual pouch with no thrombus noted.  No thrombus noted in Left atrium Moderately dilated Right atrium with mild spontaneous echo contrast. Normal TV with moderate TR Normal trileflet AV with trivial AI Normal PV Severlely thickened and calcified MV leaflets.  The posterior leaflet is fixed and immobile.  There is mitral annular calcification.  There is reduced excursion of the anterior mitral valve leaflet.  Mean MV gradient was 41mm Hg and MVA calculated at cm2.   Moderate left pleural effusion.  No evidence of intracardiac thrombus.  The patient tolerated the procedure well with out complications.  She went on to DCCV.  Signed: Fransico Him, MD [facility] 10/07/2014

## 2014-10-07 NOTE — Transfer of Care (Signed)
Immediate Anesthesia Transfer of Care Note  Patient: [name]  Procedure(s) Performed: Procedure(s): TRANSESOPHAGEAL ECHOCARDIOGRAM (TEE) (N/A) CARDIOVERSION (N/A)  Patient Location: Endoscopy Unit  Anesthesia Type:MAC  Level of Consciousness: awake, alert  and oriented  Airway & Oxygen Therapy: Patient Spontanous Breathing and Patient connected to nasal cannula oxygen  Post-op Assessment: Report given to RN and Post -op Vital signs reviewed and stable  Post vital signs: Reviewed and stable  Last Vitals:  Filed Vitals:   10/07/14 0800  BP: 100/66  Pulse: 80  Temp:   Resp: 40    Complications: No apparent anesthesia complications

## 2014-10-07 NOTE — Progress Notes (Signed)
  Echocardiogram Echocardiogram Transesophageal has been performed.  Johny Chess 10/07/2014, 8:38 AM

## 2014-10-07 NOTE — Progress Notes (Signed)
Responded to spiritual care consult to provide support to patient. Per consult pt recently lost mother and requested from [place] that scripture be shared with her. Scripture shared and prayed with patient and final blessing offered. Made patient aware of Chaplain services for continued support.  Will inform unit Chaplain of the visit and will follow as needed.   10/07/14 1400  Clinical Encounter Type  Visited With Patient;Health care provider  Visit Type Initial;Spiritual support  Referral From Nurse  Spiritual Encounters  Spiritual Needs Sacred text;Prayer;Emotional  Stress Factors  Patient Stress Factors None identified  Johnathon Mittal, Chaplain

## 2014-10-07 NOTE — Evaluation (Addendum)
Physical Therapy Evaluation Patient Details Name: [name] MRN: [id number] DOB: October 05, 1945 Today's Date: 10/07/2014   History of Present Illness  69 y/o woman with rheumatic heart disease and AF s/p MV ring and Cox-Maze procedure in 2005 transferred from [facility] on 2/5 with acute decompensated HF, rapid AF, altered mental status and hyponatremia.Apparently has been profoundly depressed for over 1 year and has not participated much in any home activities. She has been to see psychiatry and tried anti-depressants without much benefit  Clinical Impression  Pt admitted with above diagnosis. Pt currently with functional limitations due to the deficits listed below (see PT Problem List).  Pt will benefit from skilled PT to increase their independence and safety with mobility to allow discharge to the venue listed below.       Follow Up Recommendations No PT follow up    Equipment Recommendations  Rolling walker with 5" wheels (possibly)    Recommendations for Other Services       Precautions / Restrictions Precautions Precautions: Fall Restrictions Weight Bearing Restrictions: No      Mobility  Bed Mobility Overal bed mobility: Modified Independent                Transfers Overall transfer level: Needs assistance Equipment used: Rolling walker (2 wheeled) Transfers: Sit to/from Stand Sit to Stand: Min guard         General transfer comment: assist for balance  Ambulation/Gait Ambulation/Gait assistance: Min guard Ambulation Distance (Feet): 280 Feet Assistive device: Rolling walker (2 wheeled) Gait Pattern/deviations: Step-through pattern;Decreased stride length Gait velocity: decr Gait velocity interpretation: Below normal speed for age/gender General Gait Details: Pt fatigues with activity and is slightly unsteady without walker. Stability improved with walker.  Stairs            Wheelchair Mobility    Modified Rankin (Stroke Patients Only)        Balance Overall balance assessment: Needs assistance Sitting-balance support: No upper extremity supported Sitting balance-Leahy Scale: Normal     Standing balance support: No upper extremity supported Standing balance-Leahy Scale: Fair                               Pertinent Vitals/Pain Pain Assessment: No/denies pain    Home Living Family/patient expects to be discharged to:: Private residence Living Arrangements: Spouse/significant other Available Help at Discharge: Family Type of Home: House Home Access: Stairs to enter   Technical brewer of Steps: 3-4 Home Layout: One level Home Equipment: None      Prior Function Level of Independence: Independent         Comments: Per chart pt has been depressed and with decr activity at home.     Hand Dominance        Extremity/Trunk Assessment   Upper Extremity Assessment: Generalized weakness           Lower Extremity Assessment: Generalized weakness         Communication   Communication: No difficulties  Cognition Arousal/Alertness: Awake/alert Behavior During Therapy: Flat affect Overall Cognitive Status: Within Functional Limits for tasks assessed                      General Comments      Exercises        Assessment/Plan    PT Assessment Patient needs continued PT services  PT Diagnosis Difficulty walking;Generalized weakness   PT Problem List Decreased strength;Decreased activity  tolerance;Decreased balance;Decreased mobility;Decreased knowledge of use of DME  PT Treatment Interventions DME instruction;Balance training;Gait training;Functional mobility training;Patient/family education;Therapeutic activities;Therapeutic exercise   PT Goals (Current goals can be found in the Care Plan section) Acute Rehab PT Goals Patient Stated Goal: get strength back PT Goal Formulation: With patient Time For Goal Achievement: 10/14/14 Potential to Achieve Goals: Good     Frequency Min 3X/week   Barriers to discharge        Co-evaluation               End of Session Equipment Utilized During Treatment: Gait belt Activity Tolerance: Patient limited by fatigue Patient left: in chair;with call bell/phone within reach           Time: 1127-1138 PT Time Calculation (min) (ACUTE ONLY): 11 min   Charges:   PT Evaluation $Initial PT Evaluation Tier I: 1 Procedure     PT G Codes:        Vinette Crites 11/04/2014, 12:56 PM  [facility] PT [phone]

## 2014-10-07 NOTE — Progress Notes (Signed)
[place] for Warfarin and Heparin Indication: atrial fibrillation  No Known Allergies  Patient Measurements: Height: 5' 9.5" (176.5 cm) Weight: 130 lb 4.7 oz (59.1 kg) IBW/kg (Calculated) : 67.35  Vital Signs: Temp: 98.3 F (36.8 C) (02/09 0500) Temp Source: Oral (02/09 0500) BP: 83/62 mmHg (02/09 0400) Pulse Rate: 82 (02/09 0400)  Labs:  Recent Labs  10/05/14 0500 10/05/14 1600 10/06/14 0510 10/07/14 0316  HGB 12.2  --  12.6  --   HCT 36.6  --  37.6  --   PLT 262  --  263  --   LABPROT 19.3*  --  19.4* 20.4*  INR 1.61*  --  1.62* 1.73*  HEPARINUNFRC  --  0.46 0.35 0.13*  CREATININE 0.79  --  0.82  --     Estimated Creatinine Clearance: 59.6 mL/min (by C-G formula based on Cr of 0.82).  Assessment: 69 year old female with Afib for anticoagulation  Goal of Therapy:  INR 2-3 Heparin level 0.3-0.7 units/ml Monitor platelets by anticoagulation protocol: Yes   Plan:  .Increase Heparin 1050 units/hr Check heparin level in 8 hours.  Coumadin  10 mg today  .Phillis Knack, PharmD, BCPS  10/07/2014 5:53 AM

## 2014-10-07 NOTE — Progress Notes (Signed)
Patient ID: [name], female   DOB: [birth year], 69 y.o.   MRN: [id number]   69 y/o woman with rheumatic heart disease and AF s/p MV ring and Cox-Maze procedure in 2005 transferred from [facility] on 2/5 with acute decompensated HF, rapid AF, altered mental status and hyponatremia. ECHO with EF 10% and moderate RV dysfunction .  Apparently has been profoundly depressed for over 1 year and has not participated much in any home activities. She has been to see psychiatry and tried anti-depressants without much benefit. Dr. Lorelei Pont has been concerned about benzo addiction and Klonopin stopped earlier this year. Depression got worse when her 70 y/o mother dies over Thanksgiving. (She had been her primary caregiver for > 7 years).   At [facility] had rapid AF/AFL and this was felt to be etiology of acute decompensation. Loaded with digoxin but stopped due to dig level of 3. Changed to amio.   CT chest 09/30/14: Large bilateral pleural effusions. No PE +CHF  Transferred to [facility] for [facility] team to see.   RHC (2/5):  RA 5 PA 45/12 PCWP 16 CI 2.3  Remains on amiodarone drip at 30 mg per hour. Lsinopril cut back to 2.5 mg daily due to low BP. SBP remains in 70-80s. On po lasix with weight down 2 pounds. Denies dyspnea. Co-ox 62%. CVP 9. For TEE/DC-CV today    Scheduled Meds: . antiseptic oral rinse  7 mL Mouth Rinse BID  . citalopram  20 mg Oral Daily  . feeding supplement (ENSURE COMPLETE)  237 mL Oral BID BM  . furosemide  40 mg Oral Daily  . hydrOXYzine  25 mg Oral BID  . levothyroxine  50 mcg Oral QAC breakfast  . lisinopril  2.5 mg Oral Daily  . mupirocin ointment   Nasal BID  . pantoprazole  40 mg Oral QAC breakfast  . potassium chloride  20 mEq Oral BID  . sodium chloride  10-40 mL Intracatheter Q12H  . warfarin  10 mg Oral ONCE-1800  . Warfarin - Pharmacist Dosing Inpatient   Does not apply q1800   Continuous Infusions: . sodium chloride    . amiodarone 30 mg/hr (10/06/14 2000)  .  heparin 1,050 Units/hr (10/07/14 0604)   PRN Meds:.acetaminophen, alum & mag hydroxide-simeth, docusate sodium, metoprolol, morphine injection, ondansetron (ZOFRAN) IV, sodium chloride   Filed Vitals:   10/07/14 0200 10/07/14 0400 10/07/14 0500 10/07/14 0601  BP: 84/62 83/62  84/58  Pulse: 90 82  79  Temp:   98.3 F (36.8 C)   TempSrc:   Oral   Resp: 20   18  Height:      Weight:   59.1 kg (130 lb 4.7 oz)   SpO2: 95% 96%  95%    Intake/Output Summary (Last 24 hours) at 10/07/14 0626 Last data filed at 10/07/14 0400  Gross per 24 hour  Intake  806.4 ml  Output      0 ml  Net  806.4 ml    LABS: Basic Metabolic Panel:  Recent Labs  10/06/14 0510 10/07/14 0316  NA 136 133*  K 4.5 5.1  CL 98 100  CO2 34* 22  GLUCOSE 109* 89  BUN 12 21  CREATININE 0.82 0.87  CALCIUM 8.6 8.8   Liver Function Tests:  Recent Labs  10/05/14 0500 10/06/14 0510  AST 72* 65*  ALT 140* 125*  ALKPHOS 63 60  BILITOT 0.8 0.5  PROT 4.7* 5.0*  ALBUMIN 2.7* 2.7*   No results for  input(s): LIPASE, AMYLASE in the last 72 hours. CBC:  Recent Labs  10/05/14 0500 10/06/14 0510  WBC 6.3 6.7  HGB 12.2 12.6  HCT 36.6 37.6  MCV 96.6 98.7  PLT 262 263   Cardiac Enzymes: No results for input(s): CKTOTAL, CKMB, CKMBINDEX, TROPONINI in the last 72 hours. BNP: Invalid input(s): POCBNP D-Dimer: No results for input(s): DDIMER in the last 72 hours. Hemoglobin A1C: No results for input(s): HGBA1C in the last 72 hours. Fasting Lipid Panel: No results for input(s): CHOL, HDL, LDLCALC, TRIG, CHOLHDL, LDLDIRECT in the last 72 hours. Thyroid Function Tests: No results for input(s): TSH, T4TOTAL, T3FREE, THYROIDAB in the last 72 hours.  Invalid input(s): FREET3 Anemia Panel: No results for input(s): VITAMINB12, FOLATE, FERRITIN, TIBC, IRON, RETICCTPCT in the last 72 hours.  RADIOLOGY: Dg Chest Port 1v Same Day  10/03/2014   CLINICAL DATA:  CHF, shortness of breath  EXAM: PORTABLE CHEST - 1  VIEW SAME DAY  COMPARISON:  10/18/2003  FINDINGS: Cardiomegaly with pulmonary vascular congestion. No frank interstitial edema.  Small bilateral pleural effusions. Patchy left lower lobe opacity, likely atelectasis. No pneumothorax.  Right IJ venous catheter terminates at the cavoatrial junction.  Median sternotomy.  IMPRESSION: Cardiomegaly with pulmonary vascular congestion. No frank interstitial edema.  Small bilateral pleural effusions.  Patchy left lower lobe opacity, likely atelectasis.   Electronically Signed   By: Julian Hy M.D.   On: 10/03/2014 20:37    PHYSICAL EXAM General: NAD. Lying flat in bed  Neck: No JVD, no thyromegaly or thyroid nodule.  Lungs: Decreased breath sounds at bases bilaterally CV: Nondisplaced PMI.  Heart irregular S1/S2, no S3/S4, no murmur.  No peripheral edema.   Abdomen: Soft, nontender, no hepatosplenomegaly, no distention.  Neurologic: Alert and oriented x 3.  Psych: Flat affect. Extremities: No clubbing or cyanosis.   TELEMETRY: Atrial fib 70-80s  Assessment:   1. Acute systolic HF with biventricular dysfunction LVEF 10% with ? Cardiogenic shock  2. Rheumatic heart disease with severe MR  --s/p MV ring and MAZE 2005 3. Atrial fibrillation/flutter, duration unknown 4. Hyponatremia 5. Bilateral pleural effusions on CT 6. Acute respiratory failure 7. Digoxin toxicity   --loaded with digoxin at [facility]. Level 3.0 8. Hypothyroidism: TSH normal this admission 9. Depression 10. Elevated transaminases: suspect shock liver with hypotension.   Plan/Discussion:    Suspect atrial fib/flutter is what led to decompensation and deterioration of LV function but duration is unknown. Initially her rate was poorly controlled but then improved in setting of elevated dig level => HR back up again on 2/6 but much better  after starting amiodarone gtt.   - Continue coumadin, On heparin gtt. INR 1.7 today.  - remains in A fib. Continue amiodarone.  TEE-guided DCCV today.    EF low and it sounds like she was in cardiogenic shock at [facility]. RHC 2/5 showed normal filling pressures.  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  Volume status stable. Continue po lasix.  Stop lisinopril and hold off on bb due to low BP.  Continue celexa for depression.   Cardiac rehab seeing.   Glori Bickers MD  10/07/2014 6:26 AM

## 2014-10-07 NOTE — Anesthesia Preprocedure Evaluation (Addendum)
Anesthesia Evaluation  Patient identified by MRN, date of birth, ID band Patient awake    Reviewed: Allergy & Precautions, NPO status , Patient's Chart, lab work & pertinent test results  Airway        Dental   Pulmonary asthma ,          Cardiovascular +CHF + dysrhythmias Atrial Fibrillation + Valvular Problems/Murmurs MR     Neuro/Psych  Headaches, Anxiety Depression    GI/Hepatic GERD-  ,  Endo/Other  Hypothyroidism   Renal/GU Renal disease     Musculoskeletal  (+) Arthritis -,   Abdominal   Peds  Hematology   Anesthesia Other Findings   Reproductive/Obstetrics                            Anesthesia Physical Anesthesia Plan  ASA: III  Anesthesia Plan: MAC and General   Post-op Pain Management:    Induction: Intravenous  Airway Management Planned: Mask  Additional Equipment:   Intra-op Plan:   Post-operative Plan:   Informed Consent: I have reviewed the patients History and Physical, chart, labs and discussed the procedure including the risks, benefits and alternatives for the proposed anesthesia with the patient or authorized representative who has indicated his/her understanding and acceptance.     Plan Discussed with: Anesthesiologist, Surgeon and CRNA  Anesthesia Plan Comments:         Anesthesia Quick Evaluation

## 2014-10-07 NOTE — Anesthesia Postprocedure Evaluation (Signed)
  Anesthesia Post-op Note  Patient: [name]  Procedure(s) Performed: Procedure(s): TRANSESOPHAGEAL ECHOCARDIOGRAM (TEE) (N/A) CARDIOVERSION (N/A)  Patient Location: PACU and Endoscopy Unit  Anesthesia Type:MAC  Level of Consciousness: awake, alert , oriented and patient cooperative  Airway and Oxygen Therapy: Patient Spontanous Breathing  Post-op Pain: none  Post-op Assessment: Post-op Vital signs reviewed, Patient's Cardiovascular Status Stable, Respiratory Function Stable, Patent Airway, No signs of Nausea or vomiting and Pain level controlled  Post-op Vital Signs: stable  Last Vitals:  Filed Vitals:   10/07/14 1200  BP:   Pulse:   Temp: 36.6 C  Resp:     Complications: No apparent anesthesia complications

## 2014-10-07 NOTE — Progress Notes (Signed)
Pt husband notified that pt has gone down to Endo for procedure.

## 2014-10-07 NOTE — Progress Notes (Signed)
CARDIAC REHAB PHASE I   PRE:  Rate/Rhythm: 72 SR  BP:  Supine:   Sitting: 90/57  Standing:    SaO2: 99 RA  MODE:  Ambulation: 350 ft   POST:  Rate/Rhythm: 78 SR  BP:  Supine:   Sitting: 94/57  Standing:    SaO2: 100 RA 1425-1445 Assisted X 1 to ambulate. Gait steady today. Pt much more talkative and  Interactive today. She was tired by end of walk, but improved from yesterday. Pt back to bed after walk with call light in reach. Rodney Langton RN 10/07/2014 2:42 PM

## 2014-10-07 NOTE — Interval H&P Note (Signed)
History and Physical Interval Note:  10/07/2014 8:02 AM  [name]  has presented today for surgery, with the diagnosis of afib  The various methods of treatment have been discussed with the patient and family. After consideration of risks, benefits and other options for treatment, the patient has consented to  Procedure(s): TRANSESOPHAGEAL ECHOCARDIOGRAM (TEE) (N/A) CARDIOVERSION (N/A) as a surgical intervention .  The patient's history has been reviewed, patient examined, no change in status, stable for surgery.  I have reviewed the patient's chart and labs.  Questions were answered to the patient's satisfaction.     Yates Weisgerber R

## 2014-10-07 NOTE — Clinical Documentation Improvement (Signed)
Please document in your progress note and carry over to the discharge summary if you agree with the Registered Dietician's assessment 10/04/14 of "moderate malnutrition" in context of chronic illness; note states evidenced by mild muscle and fat wasting; ht. 5'9.5", wt 129 lb 3 oz; BMI 18.81.    Possible Clinical Conditions -Moderate malnutrition in context of chronic illness -Malnutrition, other severity (please specify) -Other condition (please specify) -Unable to determine at present  Thank you, Mateo Flow, RN [phone] Clinical Documentation Specialist

## 2014-10-08 ENCOUNTER — Encounter (HOSPITAL_COMMUNITY): Payer: Self-pay | Admitting: Cardiology

## 2014-10-08 ENCOUNTER — Other Ambulatory Visit: Payer: Self-pay | Admitting: Family Medicine

## 2014-10-08 DIAGNOSIS — I480 Paroxysmal atrial fibrillation: Secondary | ICD-10-CM

## 2014-10-08 LAB — PROTIME-INR
INR: 2.14 — ABNORMAL HIGH (ref 0.00–1.49)
Prothrombin Time: 24.1 seconds — ABNORMAL HIGH (ref 11.6–15.2)

## 2014-10-08 LAB — BASIC METABOLIC PANEL
ANION GAP: 5 (ref 5–15)
BUN: 16 mg/dL (ref 6–23)
CHLORIDE: 100 mmol/L (ref 96–112)
CO2: 30 mmol/L (ref 19–32)
Calcium: 8.7 mg/dL (ref 8.4–10.5)
Creatinine, Ser: 0.9 mg/dL (ref 0.50–1.10)
GFR calc Af Amer: 73 mL/min — ABNORMAL LOW (ref >90)
GFR calc non Af Amer: 63 mL/min — ABNORMAL LOW (ref >90)
Glucose, Bld: 106 mg/dL — ABNORMAL HIGH (ref 70–99)
Potassium: 4.3 mmol/L (ref 3.5–5.1)
SODIUM: 135 mmol/L (ref 135–145)

## 2014-10-08 LAB — CARBOXYHEMOGLOBIN
CARBOXYHEMOGLOBIN: 0.9 % (ref 0.5–1.5)
METHEMOGLOBIN: 0.7 % (ref 0.0–1.5)
O2 Saturation: 99.2 %
Total hemoglobin: 11.8 g/dL — ABNORMAL LOW (ref 12.0–16.0)

## 2014-10-08 LAB — CBC
HCT: 35.3 % — ABNORMAL LOW (ref 36.0–46.0)
Hemoglobin: 11.7 g/dL — ABNORMAL LOW (ref 12.0–15.0)
MCH: 32.8 pg (ref 26.0–34.0)
MCHC: 33.1 g/dL (ref 30.0–36.0)
MCV: 98.9 fL (ref 78.0–100.0)
PLATELETS: 223 10*3/uL (ref 150–400)
RBC: 3.57 MIL/uL — AB (ref 3.87–5.11)
RDW: 15.8 % — ABNORMAL HIGH (ref 11.5–15.5)
WBC: 5.2 10*3/uL (ref 4.0–10.5)

## 2014-10-08 LAB — HEPARIN LEVEL (UNFRACTIONATED): HEPARIN UNFRACTIONATED: 0.9 [IU]/mL — AB (ref 0.30–0.70)

## 2014-10-08 MED ORDER — AMIODARONE HCL 200 MG PO TABS: 200.0000 mg | ORAL_TABLET | Freq: Two times a day (BID) | ORAL | Status: DC

## 2014-10-08 MED ORDER — AMIODARONE HCL 200 MG PO TABS
200.0000 mg | ORAL_TABLET | Freq: Two times a day (BID) | ORAL | Status: DC
  Administered 2014-10-08: 200 mg via ORAL
  Filled 2014-10-08 (×2): qty 1

## 2014-10-08 MED ORDER — WARFARIN SODIUM 5 MG PO TABS
5.0000 mg | ORAL_TABLET | Freq: Every day | ORAL | Status: DC
  Filled 2014-10-08: qty 1.5

## 2014-10-08 MED ORDER — WARFARIN SODIUM 5 MG PO TABS: 5.0000 mg | ORAL_TABLET | Freq: Every day | ORAL | Status: DC

## 2014-10-08 MED ORDER — CITALOPRAM HYDROBROMIDE 20 MG PO TABS: 20.0000 mg | ORAL_TABLET | Freq: Every day | ORAL | Status: DC

## 2014-10-08 MED ORDER — OFF THE BEAT BOOK
Freq: Once | Status: AC
  Administered 2014-10-08: 12:00:00
  Filled 2014-10-08: qty 1

## 2014-10-08 MED ORDER — POTASSIUM CHLORIDE CRYS ER 20 MEQ PO TBCR: 20.0000 meq | EXTENDED_RELEASE_TABLET | Freq: Two times a day (BID) | ORAL | Status: DC

## 2014-10-08 NOTE — Progress Notes (Addendum)
Pt discharged home. VSS. Pt alert and oriented. No signs of pain and no respiratory distress. Peripheral and IJ removed; pt tolerates well. Education complete and pt husband with her. Leanne Chang, RN

## 2014-10-08 NOTE — Telephone Encounter (Signed)
Patient was in the hospital recently and they checked a TSH which was normal.  OK to refill x 1 year?

## 2014-10-08 NOTE — Progress Notes (Signed)
Patient ID: Deborah WAHLER, female   DOB: 23-Nov-1945, 69 y.o.   MRN: [id number]   69 y/o woman with rheumatic heart disease and AF s/p MV ring and Cox-Maze procedure in 2005 transferred from [facility] on 2/5 with acute decompensated HF, rapid AF, altered mental status and hyponatremia. ECHO with EF 10% and moderate RV dysfunction .  Apparently has been profoundly depressed for over 1 year and has not participated much in any home activities. She has been to see psychiatry and tried anti-depressants without much benefit. Dr. Lorelei Pont has been concerned about benzo addiction and Klonopin stopped earlier this year. Depression got worse when her 54 y/o mother dies over Thanksgiving. (She had been her primary caregiver for > 7 years).   At [facility] had rapid AF/AFL and this was felt to be etiology of acute decompensation. Loaded with digoxin but stopped due to dig level of 3. Changed to amio.   CT chest 09/30/14: Large bilateral pleural effusions. No PE +CHF  Transferred to [facility] for [facility]HF team to see.  Had successful DC-CV. Maintaining NSR. Off bb and ace due to soft BP.   Denies SOB. Wants to go home.   RHC (2/5):  RA 5 PA 45/12 PCWP 16 CI 2.3     Scheduled Meds: . antiseptic oral rinse  7 mL Mouth Rinse BID  . citalopram  20 mg Oral Daily  . feeding supplement (ENSURE COMPLETE)  237 mL Oral BID BM  . furosemide  40 mg Oral Daily  . hydrOXYzine  25 mg Oral BID  . levothyroxine  50 mcg Oral QAC breakfast  . mupirocin ointment   Nasal BID  . pantoprazole  40 mg Oral QAC breakfast  . potassium chloride  20 mEq Oral BID  . sodium chloride  10-40 mL Intracatheter Q12H  . Warfarin - Pharmacist Dosing Inpatient   Does not apply q1800   Continuous Infusions: . sodium chloride 20 mL/hr at 10/07/14 2005  . amiodarone 30 mg/hr (10/08/14 0700)   PRN Meds:.acetaminophen, alum & mag hydroxide-simeth, docusate sodium, HYDROmorphone (DILAUDID) injection, morphine injection, ondansetron (ZOFRAN) IV, sodium  chloride   Filed Vitals:   10/08/14 0500 10/08/14 0600 10/08/14 0639 10/08/14 0735  BP:   82/43 99/57  Pulse: 63 61 68   Temp:    98.5 F (36.9 C)  TempSrc:    Oral  Resp: 17 16 22    Height:      Weight:      SpO2: 97% 96% 95% 98%    Intake/Output Summary (Last 24 hours) at 10/08/14 0806 Last data filed at 10/08/14 0800  Gross per 24 hour  Intake 1459.72 ml  Output    150 ml  Net 1309.72 ml    LABS: Basic Metabolic Panel:  Recent Labs  10/07/14 0316 10/08/14 0453  NA 133* 135  K 5.1 4.3  CL 100 100  CO2 22 30  GLUCOSE 89 106*  BUN 21 16  CREATININE 0.87 0.90  CALCIUM 8.8 8.7   Liver Function Tests:  Recent Labs  10/06/14 0510  AST 65*  ALT 125*  ALKPHOS 60  BILITOT 0.5  PROT 5.0*  ALBUMIN 2.7*   No results for input(s): LIPASE, AMYLASE in the last 72 hours. CBC:  Recent Labs  10/06/14 0510 10/08/14 0453  WBC 6.7 5.2  HGB 12.6 11.7*  HCT 37.6 35.3*  MCV 98.7 98.9  PLT 263 223   Cardiac Enzymes: No results for input(s): CKTOTAL, CKMB, CKMBINDEX, TROPONINI in the last 72 hours.  BNP: Invalid input(s): POCBNP D-Dimer: No results for input(s): DDIMER in the last 72 hours. Hemoglobin A1C: No results for input(s): HGBA1C in the last 72 hours. Fasting Lipid Panel: No results for input(s): CHOL, HDL, LDLCALC, TRIG, CHOLHDL, LDLDIRECT in the last 72 hours. Thyroid Function Tests: No results for input(s): TSH, T4TOTAL, T3FREE, THYROIDAB in the last 72 hours.  Invalid input(s): FREET3 Anemia Panel: No results for input(s): VITAMINB12, FOLATE, FERRITIN, TIBC, IRON, RETICCTPCT in the last 72 hours.  RADIOLOGY: Dg Chest Port 1v Same Day  10/03/2014   CLINICAL DATA:  CHF, shortness of breath  EXAM: PORTABLE CHEST - 1 VIEW SAME DAY  COMPARISON:  10/18/2003  FINDINGS: Cardiomegaly with pulmonary vascular congestion. No frank interstitial edema.  Small bilateral pleural effusions. Patchy left lower lobe opacity, likely atelectasis. No pneumothorax.   Right IJ venous catheter terminates at the cavoatrial junction.  Median sternotomy.  IMPRESSION: Cardiomegaly with pulmonary vascular congestion. No frank interstitial edema.  Small bilateral pleural effusions.  Patchy left lower lobe opacity, likely atelectasis.   Electronically Signed   By: Julian Hy M.D.   On: 10/03/2014 20:37    PHYSICAL EXAM General: NAD. Lying flat in bed  Neck: No JVD, no thyromegaly or thyroid nodule.  Lungs: Decreased breath sounds at bases bilaterally CV: Nondisplaced PMI.  Heart regular S1/S2, no S3/S4, no murmur.  No peripheral edema.   Abdomen: Soft, nontender, no hepatosplenomegaly, no distention.  Neurologic: Alert and oriented x 3.  Psych: Flat affect. Extremities: No clubbing or cyanosis.   TELEMETRY: NSR 60s  Assessment:   1. Acute systolic HF with biventricular dysfunction LVEF 10% with ? Cardiogenic shock  2. Rheumatic heart disease with severe MR  --s/p MV ring and MAZE 2005 3. Atrial fibrillation/flutter, duration unknown 4. Hyponatremia 5. Bilateral pleural effusions on CT 6. Acute respiratory failure 7. Digoxin toxicity   --loaded with digoxin at [facility]. Level 3.0 8. Hypothyroidism: TSH normal this admission 9. Depression 10. Elevated transaminases: suspect shock liver with hypotension.   Plan/Discussion:    Suspect atrial fib/flutter is what led to decompensation and deterioration of LV function but duration is unknown. Initially her rate was poorly controlled but then improved in setting of elevated dig level => HR back up again on 2/6 but much better  after starting amiodarone gtt.   - Continue coumadin, On heparin gtt. INR 2.1 today.   EF low and it sounds like she was in cardiogenic shock at [facility]. RHC 2/5 showed normal filling pressures.  Possible tachy-mediated CMP, had DCCV yesterday. Off BB and Ace due to soft BP. Volume status mildly elevated. Continue current dose of lasix.   Stop amiodarone drip and transition  to 200 mg twice a day. Continue coumadin. INR therapeutic. 2.14. Set follow up with coumadin clinic in [place].   Continue celexa for depression.   Possible d/c today.    CLEGG,AMY NP-C  10/08/2014 8:06 AM  Patient seen and examined with Darrick Grinder, NP. We discussed all aspects of the encounter. I agree with the assessment and plan as stated above.   She is in NSR after TEE/DC-CV. Volume status looks ok. INR therapeutic.  BP too soft for ace/bblocker. Will send home today on amio 200 bid and lasix.   Will need close f/u with HF Clinic and Dr. Revonda Standard.   Will not increase Celexa given QT issues in setting of amio.   Ahonesty Woodfin,MD 8:41 AM

## 2014-10-08 NOTE — Progress Notes (Addendum)
[place] for Warfarin and Heparin Indication: atrial fibrillation  No Known Allergies  Patient Measurements: Height: 5' 9.5" (176.5 cm) Weight: 132 lb 4.4 oz (60 kg) IBW/kg (Calculated) : 67.35  Vital Signs: Temp: 98.3 F (36.8 C) (02/10 0423) Temp Source: Oral (02/10 0423) BP: 82/43 mmHg (02/10 0639) Pulse Rate: 68 (02/10 0639)  Labs:  Recent Labs  10/06/14 0510 10/07/14 0316 10/07/14 1221 10/08/14 0452 10/08/14 0453  HGB 12.6  --   --   --  11.7*  HCT 37.6  --   --   --  35.3*  PLT 263  --   --   --  223  LABPROT 19.4* 20.4*  --  24.1*  --   INR 1.62* 1.73*  --  2.14*  --   HEPARINUNFRC 0.35 0.13* 0.31 0.90*  --   CREATININE 0.82 0.87  --   --  0.90    Estimated Creatinine Clearance: 55.1 mL/min (by C-G formula based on Cr of 0.9).   Medical History: Past Medical History  Diagnosis Date  . H/O: rheumatic fever     Childhood  . Mitral regurgitation     s/p MV repair  . CVA (cerebral infarction)     h/o superior cerebellar infarct  . Atrial fibrillation     s/p cardioversion  . Hypothyroid   . History of nephrolithiasis   . Asthma   . Anxiety   . Depression   . Headache(784.0)     Migraine  . Urinary incontinence   . Acne rosacea   . Diverticulosis   . Osteopenia     Assessment: 69 year old female with remote history of non-obstructive CAD (cardiac cath 2005), history of rheumatic fever/MVPin childhood leading to severe mitral regurgitation s/p mitral valve repair.   Patient transferred from [facility] today for aggressive medical management by advanced heart failure team.   S/p successful TEE/DCCV 2/9, continues in NSR this morning. BP low, HR 60s. Heparin level high this morning, gtt turned off as INR now >2. CBC still stable, no bleeding issues noted.   Pt PTA warfarin regimen: 5mg  on Tues and Sat, 7.5 mg AOD *Amio added this admission, will need to decr weekly dose if she continues tx w/ amio  Goal of  Therapy:  INR 2-3 Heparin level 0.3-0.7 units/ml Monitor platelets by anticoagulation protocol: Yes   Plan:  D/c heparin  D/c home on 5mg  T/TR/Sat and 7.5mg  other days Follow up INR Monday 2/15 Monitor daily, INR, CBC, s/sx bleeding  Erin Hearing PharmD., BCPS Clinical Pharmacist Pager [phone] 10/08/2014 7:18 AM

## 2014-10-08 NOTE — Progress Notes (Signed)
[id number] CHF booklet reviewed with pt and husband who voiced understanding. Gave low sodium handouts and discussed 2000 mg restriction. Pt knows to weigh daily and notify MD of 3 pounds in day or 5 in week. Gave ex ed and encouraged walking for ex. Pt stated she has had atrial flutter/fib in past and knows reasoning for Coumadin. Pt received OFF THE BEAT booklet. Pt did not want referral to CRP 2, has done before. Will talk with MD if she changes her mind. Graylon Good RN BSN 10/08/2014 11:54 AM

## 2014-10-09 ENCOUNTER — Other Ambulatory Visit: Payer: Self-pay | Admitting: Family Medicine

## 2014-10-09 NOTE — Discharge Summary (Signed)
Advanced Heart Failure Team  Discharge Summary   Patient ID: [name] MRN: [id number], DOB/AGE: March 01, 1946 69 y.o. Admit date: 10/03/2014 D/C date:     10/09/2014   Primary Discharge Diagnoses:   1. Acute systolic HF with biventricular dysfunction LVEF 10% with ? Cardiogenic shock  2. Rheumatic heart disease with severe MR  --s/p MV ring and MAZE 2005 3. Atrial fibrillation/flutter, duration unknown- 10/07/2014 TEE/DC-CV  4. Hyponatremia 5. Bilateral pleural effusions on CT 6. Acute respiratory failure 7. Digoxin toxicity   --loaded with digoxin at [facility]. Level 3.0 8. Hypothyroidism: TSH normal this admission 9. Depression 10. Elevated transaminases: suspect shock liver with hypotension.   Hospital Course:  Mrs Brandenburger is a 69 year old with a history of rheumatic heart disease and AF s/p MV ring and Cox-Maze procedure in 2005.   She was admitted to [facility] 09/30/2014 with increased dyspnea and volume overload.   At [facility] had rapid AF/AFL and this was felt to be etiology of acute decompensation as well as severe LV dysfunction. She was diuresed with IV lasix and loaded with digoxin but that was later stopped due to dig level of 3.   She transferred from [facility] on 2/5 due to acute decompensated HF, rapid AF, altered mental status and hyponatremia. On arrival to [facility] she was still in Afib but her rate was controlled. Amiodarone drip was started in anticipation of TEE/DC-CV as the cardiomyopathy was possibly tachy mediated.  She had RHC with swan placed to assess hemodynamics and guide therapy that showed well compensated hemodynamics. She was started on low dose bb and ace. Due to hypotension these had to be stopped.   Once optimized she had TEE followed by successful DC-CV. IV amiodarone was stopped and po amiodarone was started. She maintained NSR. She was on heparin and coumadin until INR was therapeutic. INR was therapeutic on discharge. INR will be followed at [facility] with goal for INR 2-3.    She was started on celexa for severe depression.   Plan to follow up in HF clinic the return [place] for HF follow up.     Tests/Studies 09/30/2014 ECHO with EF 10% and moderate RV dysfunction .  RHC 10/03/2014 RA = 5 RV = 35/2/4 PA = 45/12 (22) PCW = 16 Fick cardiac output/index = 3.9/2.3 PVR = 1.5 WU Ao sat = 97% PA sat = 63%, 69% SVC sat = 65%   Discharge Weight Range: 132 pounds.  Discharge Vitals: Blood pressure 86/45, pulse 59, temperature 98.5 F (36.9 C), temperature source Oral, resp. rate 17, height 5' 9.5" (1.765 m), weight 132 lb 4.4 oz (60 kg), SpO2 95 %.  Labs: Lab Results  Component Value Date   WBC 5.2 10/08/2014   HGB 11.7* 10/08/2014   HCT 35.3* 10/08/2014   MCV 98.9 10/08/2014   PLT 223 10/08/2014    Recent Labs Lab 10/06/14 0510  10/08/14 0453  NA 136  < > 135  K 4.5  < > 4.3  CL 98  < > 100  CO2 34*  < > 30  BUN 12  < > 16  CREATININE 0.82  < > 0.90  CALCIUM 8.6  < > 8.7  PROT 5.0*  --   --   BILITOT 0.5  --   --   ALKPHOS 60  --   --   ALT 125*  --   --   AST 65*  --   --   GLUCOSE 109*  < >  106*  < > = values in this interval not displayed. Lab Results  Component Value Date   CHOL 190 05/04/2011   HDL 47.00 05/04/2011   LDLCALC 107* 05/04/2011   TRIG 182.0* 05/04/2011   BNP (last 3 results)  Recent Labs  10/03/14 1804  BNP 1697.7*    ProBNP (last 3 results) No results for input(s): PROBNP in the last 8760 hours.   Diagnostic Studies/Procedures   No results found.  Discharge Medications     Medication List    STOP taking these medications        levothyroxine 50 MCG tablet  Commonly known as:  SYNTHROID, LEVOTHROID      TAKE these medications        amiodarone 200 MG tablet  Commonly known as:  PACERONE  Take 1 tablet (200 mg total) by mouth 2 (two) times daily.     citalopram 20 MG tablet  Commonly known as:  CELEXA  Take 1 tablet (20 mg total) by mouth daily.      hydrOXYzine 25 MG tablet  Commonly known as:  ATARAX/VISTARIL  TAKE 1 TABLET TWICE DAILY AS NEEDED FOR ANXIETY     potassium chloride SA 20 MEQ tablet  Commonly known as:  K-DUR,KLOR-CON  Take 1 tablet (20 mEq total) by mouth 2 (two) times daily.     warfarin 5 MG tablet  Commonly known as:  COUMADIN  Take 1-1.5 tablets (5-7.5 mg total) by mouth daily at 6 PM.        Disposition   The patient will be discharged in stable condition to home.     Discharge Instructions    Contraindication to ACEI at discharge    Complete by:  As directed      Diet - low sodium heart healthy    Complete by:  As directed      Increase activity slowly    Complete by:  As directed           Follow-up Information    Follow up with Glori Bickers, MD On 10/15/2014.   Specialty:  Cardiology   Why:  at 12:00 Garage Code [id number]   Contact information:   [address] [place] [address] [phone]       Follow up with [facility] On 10/13/2014.   Why:  at [place] information:   [address] [place] [address]         Duration of Discharge Encounter: Greater than 35 minutes   Signed, CLEGG,AMY NP-C  10/09/2014, 2:09 PM   Patient seen and examined with Darrick Grinder, NP. We discussed all aspects of the encounter. I agree with the assessment and plan as stated above.   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 S/p successful TEE and DC-CV. BP too low for bblock or ACE. Hopefully can titrate on soon.   Daniel Bensimhon,MD 4:21 PM

## 2014-10-09 NOTE — Telephone Encounter (Signed)
Pt called to ck on hydroxyzine refill and Melissa is scheduling med refill appt with Dr Lorelei Pont.

## 2014-10-15 ENCOUNTER — Ambulatory Visit (INDEPENDENT_AMBULATORY_CARE_PROVIDER_SITE_OTHER): Payer: Medicare Other

## 2014-10-15 ENCOUNTER — Ambulatory Visit (HOSPITAL_COMMUNITY)
Admission: RE | Admit: 2014-10-15 | Discharge: 2014-10-15 | Disposition: A | Discharge status: Home / Self Care | Payer: Medicare Other | Source: Ambulatory Visit | Attending: Adult Health | Admitting: Adult Health

## 2014-10-15 VITALS — BP 107/54 | HR 73 | Resp 18 | Wt 127.8 lb

## 2014-10-15 DIAGNOSIS — Z8673 Personal history of transient ischemic attack (TIA), and cerebral infarction without residual deficits: Secondary | ICD-10-CM | POA: Diagnosis not present

## 2014-10-15 DIAGNOSIS — I480 Paroxysmal atrial fibrillation: Secondary | ICD-10-CM

## 2014-10-15 DIAGNOSIS — Z79899 Other long term (current) drug therapy: Secondary | ICD-10-CM | POA: Diagnosis not present

## 2014-10-15 DIAGNOSIS — F419 Anxiety disorder, unspecified: Secondary | ICD-10-CM | POA: Insufficient documentation

## 2014-10-15 DIAGNOSIS — J45909 Unspecified asthma, uncomplicated: Secondary | ICD-10-CM | POA: Diagnosis not present

## 2014-10-15 DIAGNOSIS — I5021 Acute systolic (congestive) heart failure: Secondary | ICD-10-CM

## 2014-10-15 DIAGNOSIS — I4891 Unspecified atrial fibrillation: Secondary | ICD-10-CM

## 2014-10-15 DIAGNOSIS — I099 Rheumatic heart disease, unspecified: Secondary | ICD-10-CM | POA: Insufficient documentation

## 2014-10-15 DIAGNOSIS — E039 Hypothyroidism, unspecified: Secondary | ICD-10-CM | POA: Diagnosis not present

## 2014-10-15 DIAGNOSIS — Z7901 Long term (current) use of anticoagulants: Secondary | ICD-10-CM | POA: Insufficient documentation

## 2014-10-15 DIAGNOSIS — I4892 Unspecified atrial flutter: Secondary | ICD-10-CM | POA: Insufficient documentation

## 2014-10-15 DIAGNOSIS — F329 Major depressive disorder, single episode, unspecified: Secondary | ICD-10-CM | POA: Diagnosis not present

## 2014-10-15 DIAGNOSIS — I5022 Chronic systolic (congestive) heart failure: Secondary | ICD-10-CM | POA: Insufficient documentation

## 2014-10-15 DIAGNOSIS — Z5181 Encounter for therapeutic drug level monitoring: Secondary | ICD-10-CM

## 2014-10-15 LAB — BASIC METABOLIC PANEL
ANION GAP: 8 (ref 5–15)
BUN: 7 mg/dL (ref 6–23)
CO2: 27 mmol/L (ref 19–32)
Calcium: 9.6 mg/dL (ref 8.4–10.5)
Chloride: 102 mmol/L (ref 96–112)
Creatinine, Ser: 0.89 mg/dL (ref 0.50–1.10)
GFR calc Af Amer: 74 mL/min — ABNORMAL LOW (ref >90)
GFR, EST NON AFRICAN AMERICAN: 64 mL/min — AB (ref >90)
Glucose, Bld: 98 mg/dL (ref 70–99)
Potassium: 4.1 mmol/L (ref 3.5–5.1)
SODIUM: 137 mmol/L (ref 135–145)

## 2014-10-15 LAB — BRAIN NATRIURETIC PEPTIDE: B Natriuretic Peptide: 691.1 pg/mL — ABNORMAL HIGH (ref 0.0–100.0)

## 2014-10-15 LAB — POCT INR: INR: 2.8

## 2014-10-15 MED ORDER — AMIODARONE HCL 200 MG PO TABS: 200.0000 mg | ORAL_TABLET | Freq: Every day | ORAL | Status: DC

## 2014-10-15 NOTE — Patient Instructions (Signed)
Decrease Amiodarone to 200 mg daily  Labs today  Your physician recommends that you schedule a follow-up appointment in: 4 weeks with [facility] in [place]

## 2014-10-15 NOTE — Progress Notes (Addendum)
Patient ID: [name], female   DOB: 20-Feb-1946, 69 y.o.   MRN: [id number] PCP: Dr Ferdinand Lango  Primary Cardiologist: Dr Lorelei Pont  HPI: [name] is a 69 year old with a history of rheumatic heart disease and AF s/p MV ring and Cox-Maze procedure in 2005.   She was admitted to [facility] 09/30/2014 with increased dyspnea and volume overload. At [facility] had rapid AF/AFL and this was felt to be etiology of acute decompensation as well as severe LV dysfunction. She was diuresed with IV lasix and loaded with digoxin but that was later stopped due to dig level of 3. She transferred from [facility] to [facility] on 2/5 due to acute decompensated HF, rapid AF, altered mental status and hyponatremia. Required amiodarone drip followed by TEE/DC-CV as the cardiomyopathy was possibly tachy mediated.Once optimized she had TEE followed by successful DC-CV. She maintained NSR. She will continue on coumadin She was on heparin and coumadin until INR was therapeutic. INR was therapeutic on discharge. INR will be followed at [facility] with goal for INR 2-3. Discharge weight 132 pounds.   Today she presents for follow up. Overall she feels great. Denies SOB/PND/Orthopnea. Able to walk up steps.  Weight at home 128 pounds. Taking all medications. No bleeding problems.    ROS: All systems negative except as listed in HPI, PMH and Problem List.  SH:  History   Social History  . Marital Status: Married    Spouse Name: N/A  . Number of Children: 2  . Years of Education: N/A   Occupational History  . Retired    Social History Main Topics  . Smoking status: Never Smoker   . Smokeless tobacco: Never Used  . Alcohol Use: No  . Drug Use: No  . Sexual Activity: Not on file   Other Topics Concern  . Not on file   Social History Narrative    FH:  Family History  Problem Relation Age of Onset  . Diabetes Mother   . Coronary artery disease Mother   . Kidney disease Mother     Past Medical History   Diagnosis Date  . H/O: rheumatic fever     Childhood  . Mitral regurgitation     s/p MV repair  . CVA (cerebral infarction)     h/o superior cerebellar infarct  . Atrial fibrillation     s/p cardioversion  . Hypothyroid   . History of nephrolithiasis   . Asthma   . Anxiety   . Depression   . Headache(784.0)     Migraine  . Urinary incontinence   . Acne rosacea   . Diverticulosis   . Osteopenia     Current Outpatient Prescriptions  Medication Sig Dispense Refill  . amiodarone (PACERONE) 200 MG tablet Take 1 tablet (200 mg total) by mouth 2 (two) times daily. 60 tablet 6  . citalopram (CELEXA) 20 MG tablet Take 1 tablet (20 mg total) by mouth daily. 30 tablet 6  . hydrOXYzine (ATARAX/VISTARIL) 25 MG tablet TAKE 1 TABLET TWICE DAILY AS NEEDED FOR ANXIETY 30 tablet 2  . levothyroxine (SYNTHROID, LEVOTHROID) 50 MCG tablet TAKE ONE TABLET BY MOUTH EVERY DAY 30 tablet 3  . potassium chloride SA (K-DUR,KLOR-CON) 20 MEQ tablet Take 1 tablet (20 mEq total) by mouth 2 (two) times daily. 60 tablet 6  . warfarin (COUMADIN) 5 MG tablet Take 1-1.5 tablets (5-7.5 mg total) by mouth daily at 6 PM. 60 tablet 6   No current facility-administered medications for  this encounter.    Filed Vitals:   10/15/14 1212  BP: 107/54  Pulse: 73  Resp: 18  Weight: 127 lb 12 oz (57.947 kg)  SpO2: 98%    PHYSICAL EXAM: General:  Well appearing. No resp difficulty HEENT: normal Neck: supple. JVP flat. Carotids 2+ bilaterally; no bruits. No lymphadenopathy or thryomegaly appreciated. Cor: PMI normal. Regular rate & rhythm. No rubs, gallops or murmurs. Lungs: clear Abdomen: soft, nontender, nondistended. No hepatosplenomegaly. No bruits or masses. Good bowel sounds. Extremities: no cyanosis, clubbing, rash, edema Neuro: alert & orientedx3, cranial nerves grossly intact. Moves all 4 extremities w/o difficulty. Affect pleasant.   ECG: NSR 73 bpm   ASSESSMENT & PLAN:  1. Chronic Systolic HF with  biventricular dysfunction LVEF 10% with ? Cardiogenic shock . Discharged February 10th not on BB or Ace due to hypotension.  Doing great! NYHA 1. She was not started on BB or Ace in the hospital due to hypotension. Today BP better. Will check BMET today. If ok can add 2.5 mg lisinopril at bed time.   Does not need diuretics.  Will need repeat ECHO after HF meds optimized.  2. Rheumatic heart disease with severe MR  --s/p MV ring and MAZE 2005 3. Atrial fibrillation/flutter, duration unknown- 10/07/2014 S/P  TEE/DC-CV --NSR. Cut back amiodarone to 200 mg daily. On coumadin. INR managed [facility] [place].  4. Hypothyroidism: TSH normal February.  5. Depression- Per PCP.   Follow up in 4 weeks with cardiology in [place]. Check BNP, BMET today. ---> K 4.1 Creatinine 0.89 BNP 691. Will add 2.5 mg lisinopril at bed time and check BMET in 10 days.    Juandaniel Manfredo NP-C  1:56 PM

## 2014-10-17 ENCOUNTER — Telehealth (HOSPITAL_COMMUNITY): Payer: Self-pay

## 2014-10-17 ENCOUNTER — Other Ambulatory Visit (HOSPITAL_COMMUNITY): Payer: Self-pay

## 2014-10-17 MED ORDER — LISINOPRIL 2.5 MG PO TABS: 2.5000 mg | ORAL_TABLET | Freq: Every day | ORAL | Status: DC

## 2014-10-17 NOTE — Addendum Note (Signed)
Encounter addended by: Melissa Montane, NT on: 10/17/2014 10:10 AM<BR>     Documentation filed: Charges VN

## 2014-10-17 NOTE — Telephone Encounter (Signed)
Attempts made to reach patient concerning lab results and new medication per Amy Clegg NP-C.  Rx for lisinopril 2.5mg  tablet at bedtime sent to rpeferred pharmacy.  Will continue to try to reach patient.

## 2014-10-22 ENCOUNTER — Ambulatory Visit (INDEPENDENT_AMBULATORY_CARE_PROVIDER_SITE_OTHER): Payer: Medicare Other | Admitting: Family Medicine

## 2014-10-22 ENCOUNTER — Ambulatory Visit (INDEPENDENT_AMBULATORY_CARE_PROVIDER_SITE_OTHER): Payer: Medicare Other

## 2014-10-22 ENCOUNTER — Encounter: Payer: Self-pay | Admitting: Family Medicine

## 2014-10-22 ENCOUNTER — Ambulatory Visit: Payer: Medicare Other | Admitting: Family Medicine

## 2014-10-22 VITALS — BP 90/56 | HR 74 | Temp 98.2°F | Ht 68.0 in | Wt 126.2 lb

## 2014-10-22 DIAGNOSIS — I5021 Acute systolic (congestive) heart failure: Secondary | ICD-10-CM

## 2014-10-22 DIAGNOSIS — F331 Major depressive disorder, recurrent, moderate: Secondary | ICD-10-CM

## 2014-10-22 DIAGNOSIS — F0390 Unspecified dementia without behavioral disturbance: Secondary | ICD-10-CM

## 2014-10-22 DIAGNOSIS — F03A0 Unspecified dementia, mild, without behavioral disturbance, psychotic disturbance, mood disturbance, and anxiety: Secondary | ICD-10-CM

## 2014-10-22 DIAGNOSIS — I4891 Unspecified atrial fibrillation: Secondary | ICD-10-CM

## 2014-10-22 DIAGNOSIS — E440 Moderate protein-calorie malnutrition: Secondary | ICD-10-CM

## 2014-10-22 DIAGNOSIS — Z5181 Encounter for therapeutic drug level monitoring: Secondary | ICD-10-CM

## 2014-10-22 DIAGNOSIS — Z7901 Long term (current) use of anticoagulants: Secondary | ICD-10-CM

## 2014-10-22 LAB — POCT INR: INR: 1.3

## 2014-10-22 MED ORDER — LISINOPRIL 2.5 MG PO TABS: 2.5000 mg | ORAL_TABLET | Freq: Every day | ORAL | Status: DC

## 2014-10-22 MED ORDER — LEVOTHYROXINE SODIUM 50 MCG PO TABS: 50.0000 ug | ORAL_TABLET | Freq: Every day | ORAL | Status: DC

## 2014-10-22 NOTE — Progress Notes (Signed)
Dr. Frederico Hamman T. Alajia Schmelzer, MD, [facility] [place], [address] Phone: [phone] Fax: [phone]  10/22/2014  Patient: [name], MRN: [id number], DOB: Jul 31, 1946, 69 y.o.  Primary Physician:  Owens Loffler, MD  Chief Complaint: Medication Refill  Subjective:   [name] is a 69 y.o. very pleasant female patient who presents with the following:  The patient is here for hospital follow-up.  The details of her cardiological hospitalization and respiratory failure are available in the hospital discharge summary, which were as reviewed, and her cardiology follow-up.  Briefly, she came to the hospital with worsening pulmonary edema, a cardiac BNP greater than 10,000, and was admitted with acute heart failure, cardiogenic shock, an EF of 10%, and ultimately she also developed respiratory failure, pleural effusions, and shock liver.  She was cardioverted, placed on amiodarone, and ultimately did okay.  She is here with her husband, who I remember well.  She is also somewhat confused about her medication, so I spent some time going over all of these with her and her husband.  Historically since I have known her, she has been intermittently quite depressed and anxious.  Last year I stopped all of her benzodiazepines and controlled substances over concerns of abuse, and she has at various times refused different SSRIs.  She was started on Celexa in the hospital, but she and her husband both want her to go off of this today.  She has intermittently taken some Vistaril, but I recently spoke to her pharmacist who had some concerns that she was taking this along with various other types of typical antihistamines.  She and her husband also bring up that she has been having memory loss, and as I tease out the history, her husband notes that this is been ongoing for at least a year or 2.  In fact, she got lost once recently when she was  driving.  She is mostly alert, but she does forget some of the names of her children as well as her sister.  She does not fully answer all the questions that we ask correctly.  She has not had any Vistaril since prior to her admission to the hospital.  Lab Results  Component Value Date   TSH 0.680 10/03/2014   T4TOTAL 7.0 03/31/2010    Admit date: 10/03/2014 D/C date: 10/09/2014  1. Acute systolic HF with biventricular dysfunction LVEF 10% with ? Cardiogenic shock  2. Rheumatic heart disease with severe MR  --s/p MV ring and MAZE 2005 3. Atrial fibrillation/flutter, duration unknown- 10/07/2014 TEE/DC-CV  4. Hyponatremia 5. Bilateral pleural effusions on CT 6. Acute respiratory failure 7. Digoxin toxicity   --loaded with digoxin at [facility]. Level 3.0 8. Hypothyroidism: TSH normal this admission 9. Depression 10. Elevated transaminases: suspect shock liver with hypotension.   Past Medical History, Surgical History, Social History, Family History, Problem List, Medications, and Allergies have been reviewed and updated if relevant.   GEN: No acute illnesses, no fevers, chills. GI: No n/v/d, eating normally Pulm: No SOB Interactive and getting along well at home.  Otherwise, ROS is as per the HPI.  Objective:   BP 90/56 mmHg  Pulse 74  Temp(Src) 98.2 F (36.8 C) (Oral)  Ht 5\' 8"  (1.727 m)  Wt 126 lb 4 oz (57.267 kg)  BMI 19.20 kg/m2  SpO2 97%  GEN: WDWN, NAD, Non-toxic HEENT: Atraumatic, Normocephalic. Neck supple. No masses, No LAD. Ears and Nose: No external  deformity. CV: RRR, 2/6 murmur. No JVD. No thrill. No extra heart sounds. PULM: CTA B, no wheezes, crackles, rhonchi. No retractions. No resp. distress. No accessory muscle use. EXTR: No c/c/e NEURO Normal gait.  PSYCH: Normally interactive. Conversant. Not depressed or anxious appearing.  Calm demeanor.   Laboratory and Imaging Data:  CLINICAL DATA: Altered mental status  EXAM: MRI HEAD WITHOUT  CONTRAST  TECHNIQUE: Multiplanar, multiecho pulse sequences of the brain and surrounding structures were obtained without intravenous contrast.  COMPARISON: MRI 02/17/2004  FINDINGS: Negative for acute infarct.  Small chronic infarct left cerebellum unchanged from the prior study. Mild chronic microvascular ischemic changes in the white matter, with progression since 2005. Mild atrophy has progressed in the interval.  Negative for intracranial hemorrhage. Negative for mass or edema. No shift of the midline structures.  Mild mucosal thickening in the sphenoid sinus.  IMPRESSION: No acute intracranial abnormality.  Mild chronic sinusitis.   Electronically Signed  By: Franchot Gallo M.D.  On: 01/10/2014 13:07   Lab Results  Component Value Date   WBC 5.2 10/08/2014   HGB 11.7* 10/08/2014   HCT 35.3* 10/08/2014   PLT 223 10/08/2014   GLUCOSE 98 10/15/2014   CHOL 190 05/04/2011   TRIG 182.0* 05/04/2011   HDL 47.00 05/04/2011   LDLDIRECT 132.3 03/31/2010   LDLCALC 107* 05/04/2011   ALT 125* 10/06/2014   AST 65* 10/06/2014   NA 137 10/15/2014   K 4.1 10/15/2014   CL 102 10/15/2014   CREATININE 0.89 10/15/2014   BUN 7 10/15/2014   CO2 27 10/15/2014   TSH 0.680 10/03/2014   INR 1.3 10/22/2014      Assessment and Plan:   Acute systolic CHF (congestive heart failure)  Major depressive disorder, recurrent episode, moderate with anxious distress  Mild dementia  Malnutrition of moderate degree  >40 minutes spent in face to face time with patient, >50% spent in counselling or coordination of care: Very complex hospital follow-up.  EF of 10%.  New-onset diagnosis of dementia.  Likely mixed vascular and probable Alzheimer's component.  Previous altered mental status workup.  Discussed risks and benefits of dementia medication, and they right now do not want to do any at all.  History of severe depression as a risk.  She stopped taking her SSRI  immediately when she left the hospital.  She and her husband don't think that they want her to take this.  I'm discontinuing her Vistaril, and we'll no longer give her any form of sedating medication or controlled substance.  Confusion about Cardiology meds. Start ACE.  I have entrusted her husband to be completely in charge of her medication distribution.  They're going to lock up her other medication.  Both she and her husband understand the need for this.  Follow-up: Return in about 4 months (around 02/20/2015).  Signed,  Deborah Deed. Mariaelena Cade, MD   Patient's Medications  New Prescriptions   No medications on file  Previous Medications   AMIODARONE (PACERONE) 200 MG TABLET    Take 1 tablet (200 mg total) by mouth daily.   POTASSIUM CHLORIDE SA (K-DUR,KLOR-CON) 20 MEQ TABLET    Take 1 tablet (20 mEq total) by mouth 2 (two) times daily.   WARFARIN (COUMADIN) 5 MG TABLET    Take 1-1.5 tablets (5-7.5 mg total) by mouth daily at 6 PM.  Modified Medications   Modified Medication Previous Medication   LEVOTHYROXINE (SYNTHROID, LEVOTHROID) 50 MCG TABLET levothyroxine (SYNTHROID, LEVOTHROID) 50 MCG tablet  Take 1 tablet (50 mcg total) by mouth daily.    TAKE ONE TABLET BY MOUTH EVERY DAY   LISINOPRIL (PRINIVIL,ZESTRIL) 2.5 MG TABLET lisinopril (PRINIVIL,ZESTRIL) 2.5 MG tablet      Take 1 tablet (2.5 mg total) by mouth at bedtime.    Take 1 tablet (2.5 mg total) by mouth daily.  Discontinued Medications   CITALOPRAM (CELEXA) 20 MG TABLET    Take 1 tablet (20 mg total) by mouth daily.   HYDROXYZINE (ATARAX/VISTARIL) 25 MG TABLET    TAKE 1 TABLET TWICE DAILY AS NEEDED FOR ANXIETY

## 2014-10-22 NOTE — Progress Notes (Signed)
Pre visit review using our clinic review tool, if applicable. No additional management support is needed unless otherwise documented below in the visit note. 

## 2014-10-24 DIAGNOSIS — F0390 Unspecified dementia without behavioral disturbance: Secondary | ICD-10-CM | POA: Insufficient documentation

## 2014-10-24 DIAGNOSIS — F03A0 Unspecified dementia, mild, without behavioral disturbance, psychotic disturbance, mood disturbance, and anxiety: Secondary | ICD-10-CM | POA: Insufficient documentation

## 2014-10-29 ENCOUNTER — Ambulatory Visit (INDEPENDENT_AMBULATORY_CARE_PROVIDER_SITE_OTHER): Payer: Medicare Other | Admitting: *Deleted

## 2014-10-29 DIAGNOSIS — Z7901 Long term (current) use of anticoagulants: Secondary | ICD-10-CM

## 2014-10-29 DIAGNOSIS — I4891 Unspecified atrial fibrillation: Secondary | ICD-10-CM

## 2014-10-29 DIAGNOSIS — Z5181 Encounter for therapeutic drug level monitoring: Secondary | ICD-10-CM

## 2014-10-29 LAB — POCT INR: INR: 1.9

## 2014-11-05 ENCOUNTER — Ambulatory Visit (INDEPENDENT_AMBULATORY_CARE_PROVIDER_SITE_OTHER): Payer: Medicare Other

## 2014-11-05 DIAGNOSIS — Z5181 Encounter for therapeutic drug level monitoring: Secondary | ICD-10-CM

## 2014-11-05 DIAGNOSIS — Z7901 Long term (current) use of anticoagulants: Secondary | ICD-10-CM

## 2014-11-05 DIAGNOSIS — I4891 Unspecified atrial fibrillation: Secondary | ICD-10-CM

## 2014-11-05 LAB — POCT INR: INR: 2

## 2014-11-12 ENCOUNTER — Ambulatory Visit (INDEPENDENT_AMBULATORY_CARE_PROVIDER_SITE_OTHER): Payer: Medicare Other | Admitting: Cardiology

## 2014-11-12 ENCOUNTER — Encounter: Payer: Self-pay | Admitting: Cardiology

## 2014-11-12 VITALS — BP 128/80 | HR 72 | Ht 69.5 in | Wt 133.5 lb

## 2014-11-12 DIAGNOSIS — I058 Other rheumatic mitral valve diseases: Secondary | ICD-10-CM

## 2014-11-12 DIAGNOSIS — Z7901 Long term (current) use of anticoagulants: Secondary | ICD-10-CM

## 2014-11-12 DIAGNOSIS — I4891 Unspecified atrial fibrillation: Secondary | ICD-10-CM

## 2014-11-12 DIAGNOSIS — I059 Rheumatic mitral valve disease, unspecified: Secondary | ICD-10-CM

## 2014-11-12 DIAGNOSIS — I5042 Chronic combined systolic (congestive) and diastolic (congestive) heart failure: Secondary | ICD-10-CM

## 2014-11-12 DIAGNOSIS — R000 Tachycardia, unspecified: Secondary | ICD-10-CM

## 2014-11-12 DIAGNOSIS — I43 Cardiomyopathy in diseases classified elsewhere: Secondary | ICD-10-CM

## 2014-11-12 DIAGNOSIS — Z79899 Other long term (current) drug therapy: Secondary | ICD-10-CM

## 2014-11-12 DIAGNOSIS — I4892 Unspecified atrial flutter: Secondary | ICD-10-CM

## 2014-11-12 NOTE — Patient Instructions (Addendum)
[name]  Your caregiver has ordered a Stress Test with nuclear imaging. The purpose of this test is to evaluate the blood supply to your heart muscle. This procedure is referred to as a "Non-Invasive Stress Test." This is because other than having an IV started in your vein, nothing is inserted or "invades" your body. Cardiac stress tests are done to find areas of poor blood flow to the heart by determining the extent of coronary artery disease (CAD). Some patients exercise on a treadmill, which naturally increases the blood flow to your heart, while others who are  unable to walk on a treadmill due to physical limitations have a pharmacologic/chemical stress agent called Lexiscan . This medicine will mimic walking on a treadmill by temporarily increasing your coronary blood flow.   Please note: these test may take anywhere between 2-4 hours to complete    Date of Procedure:_________3/25/16____________________________  Arrival Time for Procedure:_____0800 am_________________________    PLEASE NOTIFY THE OFFICE AT LEAST 24 HOURS IN ADVANCE IF YOU ARE UNABLE TO KEEP YOUR APPOINTMENT.  [phone] At [facility] on [address] [phone]   How to prepare for your Myoview test:  1. Do not eat or drink after midnight 2. No caffeine for 24 hours prior to test 3. No smoking 24 hours prior to test. 4. Your medication may be taken with water.  If your doctor stopped a medication because of this test, do not take that medication. 5. Ladies, please do not wear dresses.  Skirts or pants are appropriate. Please wear a short sleeve shirt. 6. No perfume, cologne or lotion. 7. Wear comfortable walking shoes. No heels!  Your physician has recommended that you have a pulmonary function test on 11/18/14 at 9 am. Pulmonary Function Tests are a group of tests that measure how well air moves in and out of your lungs. -no physical activity before  -no smoking 1 hr before  -no alcohol 4 hrs before   -wear non restrictive clothing  -no inhalers 3 hrs before   Your physician has requested that you have an echocardiogram in late May/Early June before your follow up with Dr. Ellyn Hack. Echocardiography is a painless test that uses sound waves to create images of your heart. It provides your doctor with information about the size and shape of your heart and how well your heart's chambers and valves are working. This procedure takes approximately one hour. There are no restrictions for this procedure.  Your physician recommends that you schedule a follow-up appointment in:  With Dr. Ellyn Hack in June after your echo.

## 2014-11-12 NOTE — Progress Notes (Signed)
PATIENT: [name] MRN: [id number] DOB: [birth year] PCP: Deborah Loffler, MD  Clinic Note: Chief Complaint  Patient presents with  . Other    No complaints. Meds reviewed verbally with pt.  . 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 Atrial Fibrillation    /Atrial flutter - on amiodarone and warfarin. Status post DCCV  . Mitral Valve Prolapse    S/P MVR   HPI: [name] is a 69 y.o. 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   PRIOR CARDIAC HISTORY 2004-2013:  Rheumatic Mitral Valve Disease - MVP with Severe MR -- referred to Dr. 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 Ron [name] 06/04/2012   She had recurrence of A. Fib with RVR that led to admission initially to [facility] on 09/30/2014 with acute combined systolic and diastolic heart failure in the setting of A. Fib RVR - 130s-150s. She is extremely volume overloaded with profound dyspnea. She was noted to have severe LV dysfunction on echocardiogram   2D Echo: (EF ~10% - severely decreased global LV Systolic function, severely increased LV internal cavity size with restrictive filling  pattern - Gr 3 DD, moderately reduced RV function, severely dilated LA - 6.4 cm, moderately dilated RA, moderate pleural effusion, mild and mass, moderate MR, mild TR).   She was aggressively diuresed with IV Lasix   Converted to Aflutter 2:1 rates ~120s  Suffered fall - getting out of bed to urinate (husband is very upset that she did not have foley while on IV diuretic -- blames the treatment team @ [facility] for her fall)  Was hypotensive, so BB or CCB not viable options --> digoxin was used (but stopped 2/2 elevated Dig levels) HR difficult to control  She was actually transferred on February 5 to [facility] evaluated by the heart failure service for decompensated heart failure - potentially cardiogenic shock as she was briefly on dobutamine while @ [facility]   She was noted to be hyponatremic and had altered mental status. On arrival to [facility] she was still in Afib but her rate was controlled.  She was placed on amiodarone drip and underwent TEE guided cardioversion.   She has Right heart catheterization, wind Swan-Ganz placement for hemodynamic guidance.  She was intolerant of beta blocker or ACE inhibitor during her hospitalization due to hypotension - both trials were aborted.  RHC #s 2/5:   RA = 5; RV = 35/2/4; PA = 45/12 (22); PCW = 16  Fick cardiac output/index = 3.9/2.3  PVR = 1.5 WU  Ao sat = 97%; PA sat = 63%, 69%; SVC sat = 65%  The thought was that her severe LV dysfunction with  likely tachycardia-induced --> On IV Heparin-Warfarin --> TEE-DCCV 2/9.  TEE / DCCV 10/07/14 -- Aflutter --> NSR  Severely dilated LV with severe LV dysfunction EF 10%  Moderately reduced RV systolic function  Severely dilated left atrium with mild spontaneous echo contrast. LA appendage appears oversewn from prior surgery with very small residual pouch with no thrombus noted. No thrombus noted in Left atrium  Moderately dilated Right atrium with mild spontaneous echo contrast.  Normal  TV with moderate TR  Normal trileflet AV with trivial AI  Normal PV  Severlely thickened and calcified MV leaflets. The posterior leaflet is fixed and immobile. There is mitral annular calcification. There is reduced excursion of the anterior mitral valve leaflet. Mean MV gradient was 31mm Hg and MVA calculated at cm2.   Moderate left pleural effusion.  No evidence of intracardiac thrombus.  She was therefore discharged on PO Amiodarone.  She was started on warfarin and heparin bridging until her INR therapeutic and then was discharged. Unfortunately she has had highs and lows of her INR since discharge and has had to have her levels adjusted. This has caused significant consternation with the husband. Her d/c weight was 132 lb -- correlating to 128 lb at home Accordingly, the husband is extremely upset about her hospitalization at [facility] -- see below for discussion.  She saw Deborah Grinder, NP [facility] heart failure clinic on February 17.  At that time she was doing well feeling great. No PND, orthopnea or edema. Walking up stairs without difficulty. No issues.  NYHA - 1.  She was started on low dose ACE-I.  Amiodarone dose was reduced to 200 mg daily. .   Interval History: She actually presents today doing very well with no complaints whatsoever. She is no longer having any signs and symptoms of dyspnea with rest or exertion. No PND, orthopnea or edema. No chest tightness or pressure with rest or exertion. She has not had any rapid or irregular heartbeat/palpitations. Not having any issues with wheezing or coughing to suggest lung toxicity from amiodarone. No significant bleeding episodes. No syncope/near syncope or TIA/amaurosis fugax.  She continues to be really NYHA class I symptomatology.  Past Medical History  Diagnosis Date  . H/O: rheumatic fever     Childhood  . Rheumatic mitral and aortic valve insufficiency 2005    a) s/p MV Ring repair; with Cox-Maze for Afib; b) Echo 2013: EF  60-65%,no Regional WMA, Gr 2 DD, no Sig MR ;; c) TEE 10/07/2014: Severlely thickened and calcified MV leaflets. The posterior leaflet is fixed and immobile. There is mitral annular calcification. There is reduced excursion of the anterior mitral valve leaflet. Mean MV gradient was 21mm Hg and MVA calculated at cm2.    Marland Kitchen CVA (cerebral infarction)     h/o superior cerebellar infarct  . Paroxysmal atrial fibrillation 2005; Recurrent 09/2014    a) 2005: s/p Cox Maze & LAA ligation; b) 09/2014: TEE-cardioversion  . Atrial fibrillation and flutter 09/2014    Revereted from Afib RVR to 2:1 A Flutter -- TEE/ DCCV 2/9; on Amiodarone  . 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 Digoxin toxicity 09/2014  . Acne rosacea   . Diverticulosis   . Osteopenia   . Anxiety   . Depression   . Urinary incontinence   . Asthma   . Hypothyroid     On replacement therapy; normal TSH 09/2014  . Migraine headache   . HYPERLIPIDEMIA 04/05/2010    Qualifier: Diagnosis of  By: Lorelei Pont MD, Frederico Hamman      Prior Cardiac Evaluation and Past Surgical History: Past Surgical History  Procedure Laterality Date  . Mitral valve repair  2005    with Cox Maze for [employer]  . Cardiac catheterization  Jan 2005    Pre-op R&LHC -- Nonobstructive CAD; normal PA pressures  . Cardioversion  06/04/2012    Procedure: CARDIOVERSION;  Surgeon: Carlena Bjornstad, MD;  Location: [facility] ENDOSCOPY;  Service: Cardiovascular;  Laterality: N/A;  . Right heart catheterization N/A 10/03/2014     Procedure: RIGHT HEART CATH;  Surgeon: Jolaine Artist, MD;  Location: [facility] CATH LAB;  Service: Cardiovascular;  Laterality: N/A;  . Tee without cardioversion N/A 10/07/2014    Procedure: TRANSESOPHAGEAL ECHOCARDIOGRAM (TEE);  Surgeon: Sueanne Margarita, MD;  Location: [place];  Service: Cardiovascular;  Laterality: N/A;  . Cardioversion N/A 10/07/2014    Procedure: CARDIOVERSION;  Surgeon: Sueanne Margarita, MD;  Location: [facility] ENDOSCOPY;  Service: Cardiovascular;  Laterality: N/A;  . Transthoracic echocardiogram  2013    EF 60-65%, mild MR, Gr 2 DD  . Transthoracic echocardiogram  09/30/2014    [facility]: EF ~10% - severely decreased global LV Systolic function, severely increased LV internal cavity size with restrictive filling pattern - Gr 3 DD, moderately reduced RV function, severely dilated LA - 6.4 cm, moderately dilated RA, moderate pleural effusion, mild and mass, moderate MR, mild TR  . Lumbar disc surgery      x 2 distantly  . Thyroidectomy, partial      partial-no cancer; now on thyroid replacement  . Abdominal hysterectomy  1980s    (no cancer),ovaries present    No Known Allergies  Current Outpatient Prescriptions  Medication Sig Dispense Refill  . amiodarone (PACERONE) 200 MG tablet Take 1 tablet (200 mg total) by mouth daily. 60 tablet 6  . levothyroxine (SYNTHROID, LEVOTHROID) 50 MCG tablet Take 1 tablet (50 mcg total) by mouth daily. 30 tablet 11  . lisinopril (PRINIVIL,ZESTRIL) 2.5 MG tablet Take 1 tablet (2.5 mg total) by mouth at bedtime. 30 tablet 5  . potassium chloride SA (K-DUR,KLOR-CON) 20 MEQ tablet Take 1 tablet (20 mEq total) by mouth 2 (two) times daily. 60 tablet 6  . warfarin (COUMADIN) 5 MG tablet Take 1-1.5 tablets (5-7.5 mg total) by mouth daily at 6 PM. 60 tablet 6   No current facility-administered medications for this visit.   SOCIAL & FAMILY HISTORY History  Substance Use Topics  . Smoking status: Never Smoker   . Smokeless tobacco: Never Used  . Alcohol  Use: No   family history includes Coronary artery disease in her mother; Diabetes in her mother; Kidney disease in her mother.  ROS: A comprehensive Review of Systems -  Was performed Review of Systems  Constitutional: Negative for weight loss and malaise/fatigue.  HENT: Negative for congestion and nosebleeds.   Respiratory: Negative for cough, shortness of breath and wheezing.   Cardiovascular: Negative for palpitations, claudication, leg swelling and PND.  Gastrointestinal: Negative for constipation and blood in stool.  Genitourinary: Negative for hematuria.  Neurological: Negative for dizziness, loss of consciousness and headaches.  Psychiatric/Behavioral: Negative for depression. The patient is nervous/anxious.   All other systems reviewed and are negative.  PHYSICAL EXAM BP 128/80 mmHg  Pulse 72  Ht 5' 9.5" (1.765 m)  Wt 133 lb 8 oz (60.555 kg)  BMI 19.44 kg/m2 General appearance: alert, cooperative, appears stated age, no distress and Well groomed, well nourished. Pleasant mood and affect. - Somewhat anxious.  HEENT: [place]/AT, EOMI, MMM, anicteric sclera Neck: no adenopathy, no carotid bruit, no JVD, supple, symmetrical, trachea midline and Thyroidectomy scar Lungs: clear to auscultation bilaterally, normal percussion bilaterally and Nonlabored, good movement Heart: RRR, normal S1 and S2. Soft images and apex. No R./G. Nondisplaced PMI. Abdomen: soft, non-tender; bowel sounds normal; no masses,  no organomegaly Extremities: extremities normal, atraumatic, no cyanosis or edema and no edema, redness or tenderness in the calves or thighs Pulses: 2+ and symmetric Skin: Skin color, texture, turgor normal. No rashes or lesions Neurologic: Grossly normal   Adult ECG Report  Rate: 72 ;  Rhythm: normal sinus rhythm  QRS Axis: 70 ;  PR Interval: 186 ; QTc: 472, Voltages: normal  Conduction Disturbances: none;  Other Abnormalities: Diffuse nonspecific ST and T wave changes  Narrative  Interpretation: relatively stable EKG  Recent Labs: reviewed from hospitalization, most recent INR 2.0. Lab Results  Component Value Date   CREATININE 0.89 10/15/2014   Lab Results  Component Value Date   INR 2.0 11/05/2014   INR 1.9 10/29/2014   INR 1.3 10/22/2014   Lab Results  Component Value Date   HGB 11.7* 10/08/2014   Lab Results  Component Value Date   K 4.1 10/15/2014    ASSESSMENT / PLAN: Pleasant woman with an unfortunate history O. Rheumatic heart disease with mitral valve ring repair and Cox maze procedure for atrial fibrillation 2005. She is now being seen in followup for what appears to be tachycardia induced cardiomyopathy. She underwent TEE guided cardioversion for atrial fibrillation after presenting with acute combined systolic and diastolic heart failure with decompensation and severe left ventricular dysfunction on echocardiogram. She was on IV Lasix. She called up at least one time with the heart failure clinic  Problem List Items Addressed This Visit    Atrial fibrillation and flutter (Chronic)    Appears to be maintaining sinus rhythm on amiodarone following cardioversion. She was very intolerant of A. fib, therefore we'll continue with rhythm control. She is on warfarin for anticoagulation and has been somewhat erratic with her INRs. Somewhat, acute by the fact that she is on amiodarone. She does have a true "valvular A. fib "and therefore is not a candidate for NOAC.  New onset/recurrence of A. fib/flutter several years following her mitral valve surgery. While this could very well just be structurally related, cannot exclude ischemic etiology. In part due to the profound reduction in EF noted while in A. fib and because of her age and risk factors, we will check a Myoview stress test to exclude ischemic coronary disease.      Relevant Orders   EKG 12-Lead (Completed)   Myocardial Perfusion Imaging   2D Echocardiogram without contrast   Chronic combined  systolic and diastolic CHF, NYHA class 1 (Chronic)    See above. Most consistent with tachycardia-induced myopathy. However she also has valvular heart disease and may have had some underlying tendency to this response.  She is on an  ACE inhibitor which could have a dose increased, however with her feeling so well I'm reluctant to make things worse by taking her hypotensive. Otherwise on amiodarone as opposed to beta blocker.      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 She is in no way acting like someone who has an EF of 10%. No active cardiac symptoms. I suspect that with resolution of A. fib her heart failure symptoms improved and her EF will likely improve dramatically with maintenance of sinus rhythm. Continue to optimize medical management and will reassess with echo in 2-3 months prior to seeing her back to reassess EF. This will also determine whether or not she would need to be referred for consideration of ICD.   Not on a beta blocker because she is on amiodarone.  On low-dose ACE inhibitor and tolerating relatively well with borderline blood pressures.  Not requiring diuretic  Reassess echo in 2-3 months on medical management and in normal sinus rhythm.      Long term current use of antiarrhythmic medical therapy   Relevant Orders   Pulmonary Function Test   On amiodarone therapy (Chronic)    As we may very well be using amiodarone long-term, we will get baseline PFTs.      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, status post MVR (Chronic)    Valve seem to be working relatively well. We will reassess with follow-up echocardiogram when she is not in A. fib.         No orders of the defined types were placed in this encounter.     This is a very difficult and prolonged patient encounter. There  was extensive chart review between her [facility] notes and [facility]. She has also been seen in followup. She clearly had a very poor experience at [facility] that prompted transfer to [facility]. She apparently tried to get up at night to urinate while on Lasix and fell hitting her head. Her husband is extremely upset and very demonstrative of this level of this pleasure indicating that he would never take his wife inside [facility] as long as he lives. This would include stress test and other tests that would potentially be done in the outpatient environment in the hospital. In addition to the Time that I took discussing this with the patient and her husband, my nurse then spent close to half an hour trying to arrange scheduling of her studies in the outpatient setting. Despite this there was still confusion at the checkout desk.  I spent well over an hour with the patient and reviewing the chart along with documentation. Pertinent studies are summarized in the past medical history after review if necessary images were also reviewed.  Followup: 3 months; after her echocardiogram

## 2014-11-14 ENCOUNTER — Encounter: Payer: Self-pay | Admitting: Cardiology

## 2014-11-14 DIAGNOSIS — Z79899 Other long term (current) drug therapy: Secondary | ICD-10-CM | POA: Insufficient documentation

## 2014-11-14 DIAGNOSIS — Z7901 Long term (current) use of anticoagulants: Secondary | ICD-10-CM | POA: Insufficient documentation

## 2014-11-15 NOTE — Assessment & Plan Note (Signed)
EF was notably reduced on both TEE and 2-D echo during her recent hospitalization. She is in no way acting like someone who has an EF of 10%. No active cardiac symptoms. I suspect that with resolution of A. fib her heart failure symptoms improved and her EF will likely improve dramatically with maintenance of sinus rhythm. Continue to optimize medical management and will reassess with echo in 2-3 months prior to seeing her back to reassess EF. This will also determine whether or not she would need to be referred for consideration of ICD.   Not on a beta blocker because she is on amiodarone.  On low-dose ACE inhibitor and tolerating relatively well with borderline blood pressures.  Not requiring diuretic  Reassess echo in 2-3 months on medical management and in normal sinus rhythm.

## 2014-11-15 NOTE — Assessment & Plan Note (Addendum)
Appears to be maintaining sinus rhythm on amiodarone following cardioversion. She was very intolerant of A. fib, therefore we'll continue with rhythm control. She is on warfarin for anticoagulation and has been somewhat erratic with her INRs. Somewhat, acute by the fact that she is on amiodarone. She does have a true "valvular A. fib "and therefore is not a candidate for NOAC.  New onset/recurrence of A. fib/flutter several years following her mitral valve surgery. While this could very well just be structurally related, cannot exclude ischemic etiology. In part due to the profound reduction in EF noted while in A. fib and because of her age and risk factors, we will check a Myoview stress test to exclude ischemic coronary disease.

## 2014-11-15 NOTE — Assessment & Plan Note (Signed)
See above. Most consistent with tachycardia-induced myopathy. However she also has valvular heart disease and may have had some underlying tendency to this response.  She is on an ACE inhibitor which could have a dose increased, however with her feeling so well I'm reluctant to make things worse by taking her hypotensive. Otherwise on amiodarone as opposed to beta blocker.

## 2014-11-15 NOTE — Assessment & Plan Note (Signed)
Valve seem to be working relatively well. We will reassess with follow-up echocardiogram when she is not in A. fib.

## 2014-11-15 NOTE — Assessment & Plan Note (Signed)
As we may very well be using amiodarone long-term, we will get baseline PFTs.

## 2014-11-18 ENCOUNTER — Ambulatory Visit: Payer: Medicare Other | Admitting: Pulmonary Disease

## 2014-11-18 DIAGNOSIS — Z79899 Other long term (current) drug therapy: Secondary | ICD-10-CM

## 2014-11-18 LAB — PULMONARY FUNCTION TEST
DL/VA % PRED: 87 %
DL/VA: 3.59 ml/min/mmHg/L
DLCO UNC: 25.71 ml/min/mmHg
DLCO unc % pred: 146 %
FEF 25-75 POST: 2.61 L/s
FEF 25-75 Pre: 2.46 L/sec
FEF2575-%CHANGE-POST: 5 %
FEF2575-%Pred-Post: 162 %
FEF2575-%Pred-Pre: 152 %
FEV1-%Change-Post: 2 %
FEV1-%Pred-Post: 170 %
FEV1-%Pred-Pre: 166 %
FEV1-POST: 3.08 L
FEV1-Pre: 3.01 L
FEV1FVC-%Change-Post: 3 %
FEV1FVC-%PRED-PRE: 99 %
FEV6-%CHANGE-POST: 0 %
FEV6-%PRED-POST: 171 %
FEV6-%Pred-Pre: 171 %
FEV6-Post: 3.92 L
FEV6-Pre: 3.92 L
FEV6FVC-%Pred-Post: 105 %
FEV6FVC-%Pred-Pre: 105 %
FVC-%CHANGE-POST: 0 %
FVC-%Pred-Post: 163 %
FVC-%Pred-Pre: 164 %
FVC-Post: 3.92 L
FVC-Pre: 3.96 L
POST FEV6/FVC RATIO: 100 %
Post FEV1/FVC ratio: 78 %
Pre FEV1/FVC ratio: 76 %
Pre FEV6/FVC Ratio: 100 %
RV % pred: 59 %
RV: 1.15 L
TLC % pred: 116 %
TLC: 5.02 L

## 2014-11-18 NOTE — Progress Notes (Signed)
PFT performed today. 

## 2014-11-19 ENCOUNTER — Ambulatory Visit (INDEPENDENT_AMBULATORY_CARE_PROVIDER_SITE_OTHER): Payer: Medicare Other

## 2014-11-19 DIAGNOSIS — I4892 Unspecified atrial flutter: Secondary | ICD-10-CM | POA: Diagnosis not present

## 2014-11-19 DIAGNOSIS — I4891 Unspecified atrial fibrillation: Secondary | ICD-10-CM

## 2014-11-19 DIAGNOSIS — Z5181 Encounter for therapeutic drug level monitoring: Secondary | ICD-10-CM | POA: Diagnosis not present

## 2014-11-19 DIAGNOSIS — Z7901 Long term (current) use of anticoagulants: Secondary | ICD-10-CM

## 2014-11-19 LAB — POCT INR: INR: 1.5

## 2014-11-21 ENCOUNTER — Ambulatory Visit (HOSPITAL_COMMUNITY): Payer: Medicare Other

## 2014-11-24 ENCOUNTER — Telehealth: Payer: Self-pay | Admitting: Family Medicine

## 2014-11-24 NOTE — Telephone Encounter (Signed)
Pt called stating she had flu shot 05/16/14 with dr Rosanna Randy

## 2014-11-24 NOTE — Telephone Encounter (Signed)
Documented

## 2014-11-27 ENCOUNTER — Ambulatory Visit: Payer: Medicare Other

## 2014-12-03 ENCOUNTER — Ambulatory Visit (INDEPENDENT_AMBULATORY_CARE_PROVIDER_SITE_OTHER): Payer: Medicare Other

## 2014-12-03 DIAGNOSIS — I4891 Unspecified atrial fibrillation: Secondary | ICD-10-CM

## 2014-12-03 DIAGNOSIS — I4892 Unspecified atrial flutter: Secondary | ICD-10-CM

## 2014-12-03 DIAGNOSIS — Z7901 Long term (current) use of anticoagulants: Secondary | ICD-10-CM | POA: Diagnosis not present

## 2014-12-03 DIAGNOSIS — Z5181 Encounter for therapeutic drug level monitoring: Secondary | ICD-10-CM | POA: Diagnosis not present

## 2014-12-03 LAB — POCT INR: INR: 1.9

## 2014-12-04 ENCOUNTER — Ambulatory Visit (HOSPITAL_COMMUNITY): Payer: Medicare Other | Attending: Cardiology | Admitting: Radiology

## 2014-12-04 DIAGNOSIS — I4891 Unspecified atrial fibrillation: Secondary | ICD-10-CM | POA: Diagnosis not present

## 2014-12-04 DIAGNOSIS — I4892 Unspecified atrial flutter: Secondary | ICD-10-CM | POA: Diagnosis not present

## 2014-12-04 MED ORDER — TECHNETIUM TC 99M SESTAMIBI GENERIC - CARDIOLITE
33.0000 | Freq: Once | INTRAVENOUS | Status: AC | PRN
  Administered 2014-12-04: 33 via INTRAVENOUS

## 2014-12-04 MED ORDER — TECHNETIUM TC 99M SESTAMIBI GENERIC - CARDIOLITE
11.0000 | Freq: Once | INTRAVENOUS | Status: AC | PRN
  Administered 2014-12-04: 11 via INTRAVENOUS

## 2014-12-04 MED ORDER — REGADENOSON 0.4 MG/5ML IV SOLN
0.4000 mg | Freq: Once | INTRAVENOUS | Status: AC
  Administered 2014-12-04: 0.4 mg via INTRAVENOUS

## 2014-12-04 NOTE — Progress Notes (Signed)
[facility] [address] [place], [place] [address] [phone]    Cardiology Nuclear Med Study  AUDI CONOVER is a 69 y.o. female     MRN : [id number]     DOB: 1946-06-23  Procedure Date: 12/04/2014  Nuclear Med Background Indication for Stress Test:  Evaluation for Ischemia History:  No known CAD, Asthma, Afib s/p DCCV Cardiac Risk Factors: Lipids  Symptoms:  recurrence of afib/flutter   Nuclear Pre-Procedure Caffeine/Decaff Intake:  None> 12 hrs NPO After: 5:30pm   Lungs:  clear O2 Sat: 98% on room air. IV 0.9% NS with Angio Cath:  22g  IV Site: R Antecubital x 1, tolerated well IV Started by:  Irven Baltimore, RN  Chest Size (in):  34 Cup Size: B  Height: 5\' 9"  (1.753 m)  Weight:  133 lb (60.328 kg)  BMI:  Body mass index is 19.63 kg/(m^2). Tech Comments:  No medications this am. Allen Derry.    Nuclear Med Study 1 or 2 day study: 1 day  Stress Test Type:  Carlton Adam  Reading MD: N/A  Order Authorizing Provider:  Glenetta Hew, MD  Resting Radionuclide: Technetium 71m Sestamibi  Resting Radionuclide Dose: 11.0 mCi   Stress Radionuclide:  Technetium 60m Sestamibi  Stress Radionuclide Dose: 33.0 mCi           Stress Protocol Rest HR: 62 Stress HR: 71  Rest BP: 129/71 Stress BP: 123/64  Exercise Time (min): n/a METS: n/a   Predicted Max HR: 150 bpm % Max HR: 47.33 bpm Rate Pressure Product: 9656   Dose of Adenosine (mg):  n/a Dose of Lexiscan: 0.4 mg  Dose of Atropine (mg): n/a Dose of Dobutamine: n/a mcg/kg/min (at max HR)  Stress Test Technologist: Glade Lloyd, BS-ES  Nuclear Technologist:  Earl Many, CNMT     Rest Procedure:  Myocardial perfusion imaging was performed at rest 45 minutes following the intravenous administration of Technetium 23m Sestamibi. Rest ECG: Sinus rhythm, nonspecific ST-T wave changes, poor R-wave progression  Stress Procedure:  The patient received IV Lexiscan 0.4 mg over 15-seconds.  Technetium  107m Sestamibi injected at 30-seconds.  Quantitative spect images were obtained after a 45 minute delay.  During the infusion of Lexiscan the patient complained of chest tightness, SOB, numb hands and lightheadedness.  These symptoms began to subside in recovery.  Stress ECG: No significant change from baseline ECG  QPS Raw Data Images:  Normal; no motion artifact; normal heart/lung ratio. Stress Images:  Dilated left ventricular chamber size noted at both rest and stress. There is apical thinning at rest and stress as well as thinning of the mid to distal anterior wall distribution. There is fairly mottled appearance to radiotracer uptake especially in rest images, likely acquisition artifact. There does not appear to be any significant ischemia identified. Rest Images:  Fairly mottled acquisition artifact noted at rest. Challenging images. Subtraction (SDS):  No evidence of ischemia. Transient Ischemic Dilatation (Normal <1.22):  1.04 Lung/Heart Ratio (Normal <0.45):  0.33  Quantitative Gated Spect Images QGS EDV:  178 ml QGS ESV:  113 ml  Impression Exercise Capacity:  Lexiscan with no exercise. BP Response:  Normal blood pressure response. Clinical Symptoms:  Typical symptoms with Lexiscan ECG Impression:  No significant ST segment change suggestive of ischemia. Comparison with Prior Nuclear Study: No images to compare  Overall Impression:  Intermediate risk stress nuclear study Secondary to decreased ejection fraction consistent with nonischemic cardiomyopathy. There is no ischemia identified.Marland Kitchen  LV Ejection Fraction: 37%.  LV Wall Motion:  Septal wall hypokinesis noted. Diffuse hypokinesis otherwise.  Candee Furbish, MD

## 2014-12-10 ENCOUNTER — Encounter: Payer: Self-pay | Admitting: Cardiology

## 2014-12-10 NOTE — Progress Notes (Signed)
Quick Note:  Nuc results: Intermediate risk stress nuclear study Secondary to decreased ejection fraction consistent with nonischemic cardiomyopathy. There is no ischemia identified..  LV Ejection Fraction: 37%. LV Wall Motion: Septal wall hypokinesis noted. Diffuse hypokinesis otherwise.  This is good news as it seems like the ejection fraction has improved. While the overall pumping function is reduced, it is much better than the 10% ejection fraction noted during hospital stay. There was no evidence of heart artery disease as a cause for the reduced function. I would hope that by the time she had a repeat echocardiogram that her function will improve back to baseline.  Leonie Man, MD  ______

## 2014-12-19 NOTE — Op Note (Signed)
PATIENT NAME:  [name], [name] MR#:  [id number] DATE OF BIRTH:  1946/02/02  DATE OF PROCEDURE:  02/25/2013  PREOPERATIVE DIAGNOSIS: Comminuted left distal radius fracture.   POSTOPERATIVE DIAGNOSIS: Comminuted left distal radius fracture.   PROCEDURE: Open reduction and internal fixation, left distal radius.   ANESTHESIA: General.  SURGEON: Laurene Footman, MD   DESCRIPTION OF PROCEDURE: The patient was brought to the operating room, and after adequate anesthesia was obtained, the left arm was prepped and draped in the usual sterile fashion with a tourniquet applied to the upper arm. After patient identification and timeout procedure was carried out, fingertrap traction was applied over the end of the bed, with 10 pounds of traction applied. The tourniquet was raised to 250 mmHg. An incision was made over the FCR tendon. The tendon sheath was incised, and the FCR tendon was retracted radially. The deep fascia was then incised and the pronator elevated off the distal fragment. With the traction applied, length was restored, but there were multiple fragments with significant comminution. These were reduced with instruments to try to get near-anatomic alignment. With traction and volar flexion, near-anatomic alignment could be obtained. A short standard DVR plate from [employer] was then applied to the distal radius with a K-wire used to hold it in the appropriate position. Three cortical screws were then placed in the shaft, followed by, with holding the fracture reduced, multiple pegs through the fast guides and the distal portion of the plate, drilling, measuring and placing the smooth locking pegs. After all peg holes were filled, traction was released, and on inspection, there was near-anatomic alignment of the comminuted fracture, with no loss of alignment with wrist range of motion. There was no penetration of the pegs into the joint. The wound was irrigated and tourniquet let down.  Hemostasis was checked with electrocautery. The wound was closed with 3-0 Vicryl subcutaneously and 4-0 nylon. A volar splint was applied after application of Xeroform, 4 x 4's, Webril, volar splint and an Ace wrap.   TOURNIQUET TIME: 36 minutes at 250 mmHg.  COMPLICATIONS: There were no complications.   SPECIMEN: No specimen.   IMPLANT: [employer] DVR standard short left plate with multiple screws and pegs.   ____________________________ Laurene Footman, MD mjm:OSi D: 02/25/2013 22:06:10 ET T: 02/26/2013 06:09:02 ET JOB#: [id number]  cc: Laurene Footman, MD, <Dictator> Laurene Footman MD ELECTRONICALLY SIGNED 02/26/2013 6:57

## 2014-12-24 ENCOUNTER — Ambulatory Visit (INDEPENDENT_AMBULATORY_CARE_PROVIDER_SITE_OTHER): Payer: Medicare Other | Admitting: *Deleted

## 2014-12-24 DIAGNOSIS — I4891 Unspecified atrial fibrillation: Secondary | ICD-10-CM

## 2014-12-24 DIAGNOSIS — Z5181 Encounter for therapeutic drug level monitoring: Secondary | ICD-10-CM | POA: Diagnosis not present

## 2014-12-24 DIAGNOSIS — I4892 Unspecified atrial flutter: Secondary | ICD-10-CM | POA: Diagnosis not present

## 2014-12-24 DIAGNOSIS — Z7901 Long term (current) use of anticoagulants: Secondary | ICD-10-CM | POA: Diagnosis not present

## 2014-12-24 LAB — POCT INR: INR: 4.8

## 2014-12-26 ENCOUNTER — Telehealth: Payer: Self-pay | Admitting: *Deleted

## 2014-12-26 ENCOUNTER — Other Ambulatory Visit: Payer: Self-pay

## 2014-12-26 MED ORDER — AMIODARONE HCL 200 MG PO TABS: 200.0000 mg | ORAL_TABLET | Freq: Every day | ORAL | Status: DC

## 2014-12-26 NOTE — Telephone Encounter (Signed)
Pt aware to continue taking Amiodarone QD.

## 2014-12-26 NOTE — Telephone Encounter (Signed)
Refill sent for amiodarone 

## 2014-12-26 NOTE — Telephone Encounter (Signed)
-----   Message from Leonie Man, MD sent at 12/26/2014 12:12 PM EDT ----- Regarding: RE: Amiodarone  Ok to reduce to qd ----- Message -----    From: Zenovia Jarred, RN    Sent: 12/26/2014   8:36 AM      To: Leonie Man, MD Subject: Amiodarone                                     FYI:  Saw pt in coumadin clinic in [place] on Wednesday . She states she has been taking her Amiodarone BID, and that what the directions on bottle say. Brenas and they say Rx from 10/08/14 says BID. Thus, instructed pt to change her dosage to QD. She last saw you 10/2014.

## 2014-12-28 NOTE — H&P (Signed)
PATIENT NAME:  [name], [name] MR#:  [id number] DATE OF BIRTH:  1945-11-13  DATE OF ADMISSION:  09/30/2014  PRIMARY CARE PHYSICIAN: None.  PRIMARY CARDIOLOGIST: Donella Stade, MD  CHIEF COMPLAINT: Right-sided pleuritic chest pain.   HISTORY OF PRESENT ILLNESS: [name] is a 69 year old Caucasian female with past medical history of hypertension and history of heart murmur and cardiac surgery requiring some valvular repair, has been on Coumadin for that, comes to the Emergency Room with significant right-sided pleuritic chest pain. The patient has been getting very fatigued, short of breath lately, has been using 3 pillows at night, and has been also noticing some swelling in her legs. She is not able to complete a full sentence without getting short of breath. She also had some cough but denies any fever. She was found to have bilateral pleural effusion on her chest x-ray with cardiomegaly and her sodium was 115. Given her symptoms, likely the patient has developed acute congestive heart failure, likely systolic given severe cardiomegaly on the chest x-ray. Echocardiogram of the heart is pending. The patient is going to receive some IV Lasix. We are going to give IV fluids at a very low rate and monitor, and she is going to be admitted on the telemetry floor.   PAST MEDICAL HISTORY:  1.  Hypothyroidism.  2.  Heart murmur.  3.  Underwent heart surgery requiring repair of aortic valve, since then has been on Coumadin. This was many years ago.   ALLERGIES: ERYTHROMYCIN.   FAMILY HISTORY: Positive for htn  SOCIAL HISTORY: She lives with her husband. Nonsmoker, nonalcoholic.  MEDICATIONS: 1.  Levothyroxine 50 mcg p.o. daily.  2.  Hydroxyzine 25 mg p.o. b.i.d.  3.  Clonazepam 1 mg at bedtime.  4.  Tylenol 500 mg 1 tablet 2 to 3 times a day.  5.  Warfarin 10 mg every day of the week except Wednesday and Saturday.   REVIEW OF SYSTEMS: CONSTITUTIONAL: No fever. Positive fatigue, weakness.   EYES: No blurred or double vision, cataracts or glaucoma.  EARS, NOSE, AND THROAT: No tinnitus, ear pain, hearing loss.  RESPIRATORY: Positive for shortness of breath and cough.  CARDIOVASCULAR: Positive for chest pain, orthopnea, edema, and dyspnea on exertion.  GASTROINTESTINAL: No nausea, vomiting, diarrhea, abdominal pain. No GERD.  GENITOURINARY: No dysuria, hematuria, or frequency.  ENDOCRINE: No polyuria, nocturia or thyroid problems.  HEMATOLOGY: No anemia or easy bruising.  SKIN: No acne or rash.  MUSCULOSKELETAL: Positive for arthritis.  NEUROLOGIC: No CVA, TIA, seizure, or dementia.  PSYCHIATRIC: No anxiety or depression.   All other systems reviewed and negative.   PHYSICAL EXAMINATION:  GENERAL: The patient is awake, alert, oriented x 3. Mild to moderate distress due to shortness of breath.  VITAL SIGNS: Afebrile. Pulse is in the 110s with tachycardia, respirations 20 per minute, blood pressure is 140/90, saturations are 98% on 2 liters.  HEENT: Atraumatic, normocephalic. PERRLA. EOM intact. Oral mucosa is moist.  NECK: Supple. No carotid bruit. The patient has JVD positive. LUNGS: Decreased breath sounds at the bases. No rales, rhonchi. Decreased breath sounds heard. No use of accessory muscles.  CARDIOVASCULAR: Tachycardia present. No murmur heard. PMI not lateralized. Chest nontender.  EXTREMITIES: One plus pitting edema. Good pedal pulses, good femoral pulses.  ABDOMEN: Soft, benign. No organomegaly.  NEUROLOGIC: Grossly intact cranial nerves II-XII. No motor or sensory deficit. Subjective weakness.  SKIN: Warm and dry.   LABORATORY AND RADIOLOGIC DATA: CT angiogram of the chest shows no evidence of  acute PE, evidence of right heart failure and likely mitral valve stenosis, large bilateral pleural effusions. BNP is 11,829.   Chest x-ray consistent with congestive heart failure with pulmonary edema, effusion, and cardiomegaly, and pulmonary vascular congestion. Troponin  is 0.04. CBC within normal limits. Basic metabolic panel within normal limits, except sodium of 115, chloride of 89, bicarbonate of 19 and glucose of 146.   ASSESSMENT: A 69 year old [name] with history of hypothyroidism, history of heart murmur and valvular surgery in the remote past, on Coumadin, comes in with:  1.  Acute congestive heart failure, suspect systolic. Echocardiogram is pending. We will admit the patient to telemetry floor. We will continue oxygen. We will give Lasix 20 mg IV b.i.d., monitor I's and O's. Cardiology consultation has been placed by Dr. Rockey Situ. Start the patient on some low-dose Coreg as blood pressure allows. We will obtain an echocardiogram of the heart. Cycle cardiac enzymes x 3. Check lipid profile.  2.  Acute hyponatremia, suspected due to volume overload with congestive heart failure. I will give Lasix around the clock and monitor sodium closely. Consider nephrology consultation if needed. Monitor I's and O's.  3.  History of valvular heart surgery in the past. The patient on Coumadin with therapeutic INR of 3.6. We will have pharmacy consult for Coumadin management. I will hold off on Coumadin tonight given her therapeutic INR. We will let pharmacy manage the dosing.  4.  Hypothyroidism. Continue Synthroid.  5.  Deep venous thrombosis prophylaxis. The patient is already on Coumadin with therapeutic INR.   Further workup according to the patient's clinical course.   CRITICAL TIME SPENT: Fifty minutes.   Case was discussed with the patient's husband who was present in the Emergency Room. She is a full code.    ____________________________ Hart Rochester. Posey Pronto, MD sap:TT D: 09/30/2014 19:39:13 ET T: 09/30/2014 21:45:03 ET JOB#: [id number]  cc: Archana Eckman A. Posey Pronto, MD, <Dictator> Ilda Basset MD ELECTRONICALLY SIGNED 10/16/2014 23:23

## 2014-12-28 NOTE — Consult Note (Signed)
PATIENT NAME:  [name], [name] MR#:  [id number] DATE OF BIRTH:  01/29/1946  DATE OF CONSULTATION:  10/02/2014  REFERRING PHYSICIAN:   CONSULTING PHYSICIAN:  Jerene Bears, MD  REASON FOR CONSULTATION:  Nasal bone fracture.   HISTORY OF PRESENT ILLNESS: The patient is a 69 year old female who was admitted for congestive heart failure and bilateral pulmonary effusions who was ambulating on the night of 10/01/2014 here in the hospital and fell, sustained a facial injury and underwent a CT scan which revealed a minimally displaced nasal bone fracture and I was consulted for evaluation. The patient reports that she continues to have some sensitivity and pain, but no significant pain now.  Of note, her history is limited given her current mental status.    PAST MEDICAL HISTORY:  Significant for hypothyroidism, congestive heart failure, heart murmur and aortic valve replacement.    SOCIAL HISTORY:  The patient lives with her husband.  Is a nonsmoker and nonalcoholic.   FAMILY HISTORY: There is no significant reaction to anesthetics.   CURRENT MEDICATIONS: Acetaminophen, aspirin, carvedilol, clonazepam, Colace, Lasix, Atarax, Synthroid, Lopressor, morphine, Senokot, simethicone, digoxin, Protonix, Coumadin.   PHYSICAL EXAMINATION:  GENERAL: Is a well-nourished female sitting upright in bed with bilateral ecchymosis underneath her eyes as well as on the bridge of her nose.   EARS: External ears are clear.  NOSE: Anteriorly is clear to examination. She has some swelling of her nasal bridge, but it is grossly midline. No palpable step-offs, deformity or mobility of the nasal bone. There is peripherally tenderness.  Oral cavity and oropharynx reveals xerostomia.  NECK: Supple. No lymphadenopathy or thyromegaly.   DIAGNOSTIC DATA:  CT scan is reviewed which reveals minimally displaced bilateral nasal bone fractures with slight deviation to the patient's left side.   IMPRESSION: Minimally displaced  nasal bone fractures with no significant cosmetic deformity.   PLAN: I discussed my findings with the patient's friend as well as the patient.   I have recommended evaluation and observation after several days to see once swelling goes down where there is any significant abnormality of her nasal bridge.  Given the patient's prior history, as well as a very limited cosmetic defect on physical examination as well as anticipation on CT scan, most likely she will be nonoperative.  But, I would like to see her back on Tuesday 10/07/2014 as an outpatient for repeat evaluation.  If she continues to be an inpatient at that time, please page me and I will come by and take a peak once the swelling has gone down.       ____________________________ Jerene Bears, MD ccv:DT D: 10/03/2014 07:59:06 ET T: 10/03/2014 10:20:54 ET JOB#: [id number]  cc: Jerene Bears, MD, <Dictator> Jerene Bears MD ELECTRONICALLY SIGNED 10/09/2014 8:39

## 2014-12-28 NOTE — Consult Note (Signed)
Chief Complaint:  Subjective/Chief Complaint Somewhat lethargic. SBP low. Pt tachycardic. Na level going up. Fell last night. Echo showed LVEF of only 10%. Less odynophagia/dysphagia. Still with chest burning.   VITAL SIGNS/ANCILLARY NOTES: **Vital Signs.:   04-Feb-16 14:31  Vital Signs Type Recheck  Pulse Pulse 630  Systolic BP Systolic BP 88  Diastolic BP (mmHg) Diastolic BP (mmHg) 54  Mean BP 65   Brief Assessment:  GEN no acute distress   Cardiac tachycardic   Lab Results: Routine Chem:  04-Feb-16 02:55   Glucose, Serum 96  BUN 14  Creatinine (comp) 0.88  Sodium, Serum  123  Potassium, Serum 4.5  Chloride, Serum  89  CO2, Serum 25  Calcium (Total), Serum  8.2  Anion Gap 9  Osmolality (calc) 248  eGFR (African American) >60  eGFR (Non-African American) >60 (eGFR values <4m/min/1.73 m2 may be an indication of chronic kidney disease (CKD). Calculated eGFR, using the MRDR Study equation, is useful in  patients with stable renal function. The eGFR calculation will not be reliable in acutely ill patients when serum creatinine is changing rapidly. It is not useful in patients on dialysis. The eGFR calculation may not be applicable to patients at the low and high extremes of body sizes, pregnant women, and vegetarians.)  Routine Coag:  04-Feb-16 02:55   Prothrombin  31.8 (11.4-15.0 NOTE: New Reference Range  09/26/14)  INR 3.1 (INR reference interval applies to patients on anticoagulant therapy. A single INR therapeutic range for coumarins is not optimal for all indications; however, the suggested range for most indications is 2.0 - 3.0. Exceptions to the INR Reference Range may include: Prosthetic heart valves, acute myocardial infarction, prevention of myocardial infarction, and combinations of aspirin and anticoagulant. The need for a higher or lower target INR must be assessed individually. Reference: The Pharmacology and Management of the Vitamin K   antagonists: the seventh ACCP Conference on Antithrombotic and Thrombolytic Therapy. CZSWFU.9323Sept:126 (3suppl): [id number] A HCT value >55% may artifactually increase the PT.  In one study,  the increase was an average of 25%. Reference:  "Effect on Routine and Special Coagulation Testing Values of Citrate Anticoagulant Adjustment in Patients with High HCT Values." American Journal of Clinical Pathology 2006;126:400-405.)  Routine Hem:  04-Feb-16 02:55   WBC (CBC) 9.0  RBC (CBC)  3.74  Hemoglobin (CBC) 12.3  Hematocrit (CBC) 37.0  Platelet Count (CBC) 172  MCV 99  MCH 33.0  MCHC 33.3  RDW  14.8  Neutrophil % 84.4  Lymphocyte % 7.3  Monocyte % 8.0  Eosinophil % 0.2  Basophil % 0.1  Neutrophil #  7.6  Lymphocyte #  0.7  Monocyte # 0.7  Eosinophil # 0.0  Basophil # 0.0 (Result(s) reported on 02 Oct 2014 at 04:24AM.)   Radiology Results: Cardiology:    02-Feb-16 21:16, Echo Doppler  Echo Doppler   REASON FOR EXAM:      COMMENTS:       [facility])  - Sep 30 2014  9:16PM     RESULT: Echocardiogram Report    [facility]   [facility] LPDate of Exam: 09/30/2014  Medical Rec #:  [id number]       Custom1:  Date of Birth:  105-Apr-1947    Height:       69.0 in  Patient Age:    89years      Weight:       130.0 lb  Patient Gender:  F             BSA:          1.72 m??    Indications: CHF  Sonographer:    Arville Go RDCS  Referring Phys: Fritzi Mandes, A    Summary:   1. Left ventricular ejection fraction, by visual estimation, is 10%.   2. Severely decreased global left ventricular systolic function.   3. Severely increased left ventricular internal cavity size.   4. Restrictive pattern of LV diastolic filling.   5. Moderately reduced RV systolic function.   6. Severely dilated left atrium.   7. Moderately dilated right atrium.   8. Moderate pleural effusion in the left lateral region.   9. Mild mitral stenosis.  10. Moderate  mitral valve regurgitation.  11. Severe thickening and calcification of the anterior and posterior   mitral valve leaflets. Rheumatic deformity of the mitral valve can not be     excluded.  12. Mild tricuspid regurgitation.  2D AND M-MODE MEASUREMENTS (normal ranges within parentheses):  Left Ventricle:          Normal  IVSd (2D):      0.87 cm (0.7-1.1)  LVPWd (2D):     0.89 cm (0.7-1.1) Aorta/LA:                  Normal  LVIDd (2D):     6.47 cm (3.4-5.7) Aortic Root (2D): 2.90 cm (2.4-3.7)  LVIDs (2D):     6.01 cm           Left Atrium (2D): 6.40 cm (1.9-4.0)  LV FS (2D):      7.1 %   (>25%)  LV EF (2D):     15.5 %   (>50%)                                    Right Ventricle:                                    RVd (2D):  LV SYSTOLIC FUNCTION BY 2D PLANIMETRY (MOD):  EF-A4CView: 83.4 %  LV DIASTOLIC FUNCTION:  MV Peak E: 1.31 m/s  SPECTRAL DOPPLER ANALYSIS (where applicable):  Mitral Valve:  MV Max Vel:   1.74 m/s MV P1/2 Time: 37.00 msec  MV Mean Grad: 4.0 mmHg MV Area, PHT: 5.95 cm??  Aortic Valve: AoV Max Vel: 0.92 m/s AoV Peak PG: 3.4 mmHg AoV Mean PG:  LVOT Vmax: 0.38 m/s LVOT VTI:  LVOT Diameter: 2.10 cm  AoV Area, Vmax: 1.41 cm?? AoV Area, VTI:  AoV Area, Vmn:  Tricuspid Valve and PA/RV Systolic Pressure: TR Max Velocity: 2.28 m/s RA   Pressure: 5 mmHg RVSP/PASP: 25.7 mmHg    PHYSICIAN INTERPRETATION:  Left Ventricle: The left ventricular internal cavity size was severely   increased. No evidence of left ventricular hypertrophy. Global LV   systolic function was severely decreased. Left ventricular ejection     fraction, by visual estimation, is 10%. Spectral Doppler shows   restrictive pattern of LV diastolic filling.  Right Ventricle: The right ventricular size is mildly enlarged. Global RV   systolic function is moderately reduced.  Left Atrium: Theleft atrium is severely dilated.  Right Atrium: The right atrium is moderately dilated.  Pericardium: There is no  evidence of pericardial effusion. There is a  moderate pleural effusion in the left lateral region.  Mitral Valve: There is severe thickening and calcification of the   anterior and posterior mitral valve leaflets. Mild mitral valve stenosis.   Moderate mitral valve regurgitation is seen.  Tricuspid Valve: Mild tricuspid regurgitation is visualized. The   tricuspid regurgitant velocity is 2.28 m/s, and with an assumed right   atrial pressure of 5 mmHg, the estimated right ventricular systolic   pressure is normal at 25.7 mmHg.  Aortic Valve: The aortic valve is trileaflet and structurally normal,   with normal leaflet excursion; without any evidence of aortic stenosis or   insufficiency.  Pulmonic Valve: Mild pulmonic valve regurgitation.  Aorta: The aortic root is normal in size and structure.  Venous: The inferior vena cava was normal sized.    [id number] [name] MD  Electronically signed by [id number] [name] MD  Signature Date/Time: 10/02/2014/8:48:45 AM    *** Final ***    IMPRESSION: .    Verified By: Rogue Jury A. Fletcher Anon, M.D., MD   Assessment/Plan:  Assessment/Plan:  Assessment GERD/dysphagia/odynophagia. At this point, patient too high a risk for any EGD.   Plan Continue protonix bid. Continue mechanical soft diet. IF GI symptoms do not improve significantly, then order barium swallow/UGI series to check for esophageal stricture. Call back if there are any changes. thanks   Electronic Signatures: Verdie Shire (MD)  (Signed 04-Feb-16 14:46)  Authored: Chief Complaint, VITAL SIGNS/ANCILLARY NOTES, Brief Assessment, Lab Results, Radiology Results, Assessment/Plan   Last Updated: 04-Feb-16 14:46 by Verdie Shire (MD)

## 2014-12-28 NOTE — Consult Note (Signed)
   Comments   It appears pt is transferring to [facility] today.  Will defer consult at this time.   Electronic Signatures: Timber Lucarelli, Judith Part (NP)  (Signed 05-Feb-16 08:43)  Authored: Palliative Care   Last Updated: 05-Feb-16 08:43 by Maudry Mayhew (NP)

## 2014-12-28 NOTE — Consult Note (Signed)
PATIENT NAME:  [name], [name] MR#:  [id number] DATE OF BIRTH:  02-10-1946  DATE OF CONSULTATION:  10/01/2014  REFERRING PHYSICIAN:   CONSULTING PHYSICIAN:  Lupita Dawn. Candace Cruise, MD  REASON FOR REFERRAL: Odynophagia.  HISTORY OF PRESENT ILLNESS: The patient is a 69 year old white female with known history of heart disease and hypertension who is on Coumadin long-term who initially came with right-sided pleuritic chest pain associated with fatigue and shortness of breath and some swelling in the legs. She was found to be in congestive heart failure with BNP level of 11,829. She was also found to be hyponatremic with sodium level of only 115 and even though she is on Lasix she drinks a large amount of water per day, at least 4 to 5 liters, which may be contributing to the hyponatremia. I was asked to see the patient because of odynophagia when swallowing.  The patient tells me she has been having bouts of nausea and vomiting. She also has heartburn symptoms. She describes having dysphagia as well as odynophagia when she swallows food. It has been going on for awhile now. She has not been on any antireflux medications. The patient denies any prior endoscopies. The patient was just started on Protonix b.i.d.   PAST MEDICAL HISTORY: Notable for hypothyroidism and heart murmur. Had an aortic valve replaced years ago, is on Coumadin.   ALLERGIES: ERYTHROMYCIN.   FAMILY HISTORY: Notable for heart disease.   SOCIAL HISTORY: She does not smoke or drink.   MEDICATIONS AT HOME: Include levothyroxine, hydroxyzine, clonazepam and Coumadin.   REVIEW OF SYSTEMS: Please refer to the initial H and P. There are no changes.   PHYSICAL EXAMINATION: GENERAL: The patient is in no acute distress right now. Her breathing has improved, although she still feels a little short of breath.  VITAL SIGNS: She is afebrile. She is slightly tachycardic.  HEENT: Normocephalic, atraumatic. Pupils are equally reactive. Throat was clear.   NECK: Supple.  CARDIAC: Tachycardic rhythm. I did not appreciate any murmur.  LUNGS: Decreased breath sounds bilaterally.  ABDOMEN: Normoactive bowel sounds. It was soft and nontender. There is no hepatomegaly.  EXTREMITIES: No clubbing, cyanosis, or edema right now.  SKIN: Negative.   DIAGNOSTIC DATA: CT angiogram was negative. There is evidence of right heart failure.   BNP again is high at 11,829. Sodium 115. Her liver enzymes are slightly elevated as well with bilirubin 1.7, AST 168, and ALT 191. CPK enzymes are normal.   ASSESSMENT AND PLAN: This is a patient with significant hyponatremia who has evidence of congestive heart failure who now has dysphagia and odynophagia. It looks like she does have evidence of gastroesophageal reflux disease. She may be developing esophageal stricture. There is a possibility of candidal esophagitis, although I did not appreciate any candidiasis on her oral exam. I agree with Protonix twice a day. Hopefully the symptoms will improve with time. We can consider doing an endoscopy later once her sodium level is better and congestive heart failure is better. Also, her Coumadin needs to be held for a minimum of 5 days until the INR is less than 1.5 before we can consider doing an upper endoscopy. She is not stable enough to do an endoscopy right now. If endoscopy cannot be done, upper GI series might be considered instead, so that she can continue with the Coumadin. If certainly upper GI shows stricture, then the patient will need dilation later on. I will continue to follow the patient. Thank you for the referral.  ____________________________ Lupita Dawn. Candace Cruise, MD pyo:sb D: 10/02/2014 09:47:00 ET T: 10/02/2014 10:03:31 ET JOB#: [id number]  cc: Lupita Dawn. Candace Cruise, MD, <Dictator> Lupita Dawn Kendrick Haapala MD ELECTRONICALLY SIGNED 10/02/2014 15:08

## 2014-12-28 NOTE — Consult Note (Signed)
Pt seen and examined. Pt with significant hyponatremia, CHF, and dyphagia/odyphagia with heartburn symptoms. On coumadin. Agree with protonix bid. Will see if symptoms improve. Cannot schedule EGD until coumadin held for minimum 4-5 days with INR dropping below 1.5  and Na up above 130. Will follow. Thanks.   Electronic Signatures: Verdie Shire (MD) (Signed on 03-Feb-16 16:06)  Authored   Last Updated: 03-Feb-16 16:07 by Verdie Shire (MD)

## 2014-12-28 NOTE — Consult Note (Signed)
Brief Consult Note: Diagnosis: nasal fracture minimally displaced.   Patient was seen by consultant.   Consult note dictated.   Recommend further assessment or treatment.   Comments: 69 admitted for SOB and CHF underwent fall last night.  Facial swelling and nasal bone fracture seen on CT scan.  PE: Gen- somnolent laying in bed Nose- clear anteriorly, Al[place] in place, old dried blood but no active bleeding.  no hematoma or seroma of septum.  dorsal bridge midline with edema and bruising.  no obvious significant deviation although very slight to left on exam OC/OP- normal oropharyngeal exam Neck- +JVD, no lymphadenopathy or other abnormality  Impression:  Minimally Displaced Nasal Bone fracture  Plan:  Will like patient to follow up on Tuesday 10/06/13 for repeat evaluation once the swelling has gone down.  Most likely will be nonoperative given the minimal displacement as well as the patient's underlying heart and lung disease.  Recommend humidification.  Electronic Signatures: Kendre Sires, Shela Leff (MD)  (Signed 04-Feb-16 11:02)  Authored: Brief Consult Note   Last Updated: 04-Feb-16 11:02 by Pascal Lux (MD)

## 2014-12-28 NOTE — Consult Note (Signed)
General Aspect Primary Cardiologist: Dr. 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 She has been noting increased dyspnea for the past several weeks. Cardiology was consulted 2/2 CXR showing pulmonary edema and effusions.  _________________  PMH: 1. 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 Chronic a-fib s/p Cox-Maze procedure in 2012 during the above MVR s/p DCCV 2013 on Coumadin 3. Non-obstructive CAD (cardiac cath 2005) 4. 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 History of prior stroke involving the superior cerebellar region 6. Hypothyroidism 7. Depression 8. Anxiety ________________   Present Illness 69 year old female with the above complex problem list who presented to [facility] on 09/30/2014 with a several week history of increased dyspnea and a one day history of left sided breast pain and  esophageal spasm. She was last seen in outpatient cardiology in 2013, and has been lost to follow up since that time.   She has a history of rheumatic fever as a child who has had a long standing history of mitral regurgitation and mitral valve prolapse. She was referred to Dr. Minus Breeding in April of 2004 for evaluation of persistent atrial??fibrillation.??She was started on Coumadin and a calcium channel blocker at??that time.??In December 2004 an echo was performed which showed moderate to??severe mitral regurgitation with markedly enlarged left atrium and moderate??dilatation of the left ventricle but otherwise well preserved left??ventricular systolic function.??Because of progressive chamber enlargement??and the severity of her mitral regurgitation she underwent a TEE on September 12, 2003. Once again this confirmed the presence??of moderate to severe mitral regurgitation with associated left atrial and??left ventricular chamber enlargement, annular dilatation of mitral valve??annulus and bileaflet prolapse with thickened anterior and posterior??leaflets of the mitral valve.??She subsequently underwent elective left and??right heart catheterization's on September 16, 2003. She was not found to have any significant coronary artery disease.??Her pulmonary artery pressures were average of 21/10 with pulmonary capillary wedge pressure of 12. She underwent successful MVR 09/2003 as above. She has done well since then. During that procedure she also underwent Cox-Maze procedure and clipping of the left atrial appendage. Last echo from 2012 showed an EF of 6065%, GR2DD, stable MVR.  At her last outpatient follow up in 2013 she was noted to be in atrial flutter 2:1 conduction with HR of 132. She wore a 24 hour Holter monitor which showed atrial flutter with variable conduction. She was scheduled for outpatient cardioversion with Dr. Ron Parker. She underwent successful DCCV on 06/04/2012. Since that time she has only  followed up for her anticoagulation appointments.   She presented  to [facility] on 09/30/2014 with a several week history of fatigue, weakness, and increased dyspnea. She also noted a one day history of left sided breast pain and esophageal spasm that has prevented her from eating. Her husband feels like her symptoms are 2/2 her anxiety and depresison medications and would like for her to stop them, her daughter feels like she sits at home too much and would like for her to get out more often. She reports increased SOB since the end of December. She has been sleeping with 3 pillows. Her weight has been declining, though she does not eat much. She drinks 5-10 "regular sized" water bottles daily. She does apply salt to some foods and eats out at restaurants a couple of times weekly. She has noted some lower extremity swelling. Some early satiety. No cough. On 2/2 she attempted to eat but was unable to 2/2 significant burning sensation along her esophagous. This sensation still persists. No further left sided breast pain. She was at rest when her left sided breast pain initially began.   Upon her arrival to [facility] she was found to have CXR that showed cardiomegaly, vascular congestion, edema, and pulmonary effusions, chest CT negative for PE, but did show mitral stenosis. Troponin negative x 3.   She is currently resting in bed comfortably with her daughter at her bedside.   Physical Exam:  GEN no acute distress, thin   HEENT hearing intact to voice   NECK supple   RESP normal resp effort  crackles   CARD Tachycardic  Murmur   ABD denies tenderness  soft   EXTR negative edema   NEURO cranial nerves intact   PSYCH alert   Review of Systems:  General: Fatigue  Weakness   Skin: No Complaints   ENT: No Complaints   Eyes: No Complaints   Neck: No Complaints   Respiratory: Short of breath   Cardiovascular: Chest pain or discomfort  Dyspnea  Orthopnea  Edema   Gastrointestinal: No Complaints    Genitourinary: No Complaints   Vascular: No Complaints   Musculoskeletal: No Complaints   Neurologic: No Complaints   Hematologic: No Complaints   Endocrine: No Complaints   Psychiatric: No Complaints   Review of Systems: All other systems were reviewed and found to be negative   Medications/Allergies Reviewed Medications/Allergies reviewed   Family & Social History:  Family and Social History:  Family History Coronary Artery Disease  Hypertension  Diabetes Mellitus   Social History negative tobacco, negative ETOH, negative Illicit drugs   Place of Living Home     Hypothyroidism:    heart murmur:   Home Medications: Medication Instructions Status  clonazePAM 1 mg oral tablet 1 tab(s) orally once a day at 4am. Active  levothyroxine 50 mcg (0.05 mg) oral tablet 1 tab(s) orally once a day Active  warfarin 10 mg oral tablet 1 tab(s) orally once a day on Monday, Tuesday, Thursday, Friday, and Sunday. Active  warfarin 5 mg oral tablet 1 tab(s) orally once a day on Wednesday and Sunday along with 38m tab=854mtotal. Active  warfarin 3 mg oral tablet 1 tab(s) orally once a day on Wednesday and Sunday along with 19m32mab=8mg97mtal Active  Tylenol 500 mg oral tablet 1 tab(s) orally 2 to 3 times a day as needed for migraine. Active  hydrOXYzine hydrochloride hydrochloride 25 mg oral tablet 1 tab(s) orally 2 times a day Active   Lab Results:  Routine Chem:  02-Feb-16 19:00   B-Type Natriuretic Peptide (  [place])  [phone] (Result(s) reported on 30 Sep 2014 at 07:38PM.)  03-Feb-16 01:17   Glucose, Serum  121  BUN 11  Creatinine (comp) 0.84  Sodium, Serum  116  Potassium, Serum 4.3  Chloride, Serum  85  CO2, Serum 21  Calcium (Total), Serum  7.8  Osmolality (calc) 235  eGFR (African American) >60  eGFR (Non-African American) >60 (eGFR values <50m/min/1.73 m2 may be an indication of chronic kidney disease (CKD). Calculated eGFR, using the MRDR Study equation, is useful in   patients with stable renal function. The eGFR calculation will not be reliable in acutely ill patients when serum creatinine is changing rapidly. It is not useful in patients on dialysis. The eGFR calculation may not be applicable to patients at the low and high extremes of body sizes, pregnant women, and vegetarians.)  Result Comment SODIUM - RESULTS VERIFIED BY REPEAT TESTING.  - NOTIFIED OF CRITICAL VALUE  - READ-BACK PROCESS PERFORMED.  - CALLED [facility]YSTAL [facility]ON 10/01/14  - SNJ  Result(s) reported on 01 Oct 2014 at 01:45AM.  Anion Gap 10  Cardiac:  02-Feb-16 16:50   Troponin I 0.04 (0.00-0.05 0.05 ng/mL or less: NEGATIVE  Repeat testing in 3-6 hrs  if clinically indicated. >0.05 ng/mL: POTENTIAL  MYOCARDIAL INJURY. Repeat  testing in 3-6 hrs if  clinically indicated. NOTE: An increase or decrease  of 30% or more on serial  testing suggests a  clinically important change)    20:46   Troponin I 0.04 (0.00-0.05 0.05 ng/mL or less: NEGATIVE  Repeat testing in 3-6 hrs  if clinically indicated. >0.05 ng/mL: POTENTIAL  MYOCARDIAL INJURY. Repeat  testing in 3-6 hrs if  clinically indicated. NOTE: An increase or decrease  of 30% or more on serial  testing suggests a  clinically important change)  CPK-MB, Serum  5.3 (Result(s) reported on 30 Sep 2014 at 09:12PM.)  03-Feb-16 01:17   Troponin I 0.03 (0.00-0.05 0.05 ng/mL or less: NEGATIVE  Repeat testing in 3-6 hrs  if clinically indicated. >0.05 ng/mL: POTENTIAL  MYOCARDIAL INJURY. Repeat  testing in 3-6 hrs if  clinically indicated. NOTE: An increase or decrease  of 30% or more on serial  testing suggests a  clinically important change)  CPK-MB, Serum  4.1 (Result(s) reported on 01 Oct 2014 at 02:04AM.)  Routine Coag:  03-Feb-16 01:17   Prothrombin  34.3 (11.4-15.0 NOTE: New Reference Range  09/26/14)  INR 3.4 (INR reference interval applies to patients on anticoagulant therapy. A single INR  therapeutic range for coumarins is not optimal for all indications; however, the suggested range for most indications is 2.0 - 3.0. Exceptions to the INR Reference Range may include: Prosthetic heart valves, acute myocardial infarction, prevention of myocardial infarction, and combinations of aspirin and anticoagulant. The need for a higher or lower target INR must be assessed individually. Reference: The Pharmacology and Management of the Vitamin K  antagonists: the seventh ACCP Conference on Antithrombotic and Thrombolytic Therapy. CNLGXQ.1194Sept:126 (3suppl): [id number] A HCT value >55% may artifactually increase the PT.  In one study,  the increase was an average of 25%. Reference:  "Effect on Routine and Special Coagulation Testing Values of Citrate Anticoagulant Adjustment in Patients with High HCT Values." American Journal of Clinical Pathology 2006;126:400-405.)  Routine Hem:  02-Feb-16 16:50   Hemoglobin (CBC) 13.5  Hematocrit (CBC) 40.4   EKG:  EKG Interp. by me   Interpretation a-fib with RVR, 128 bpm, right axis deviation, occasional PVC, no significant st/t changes  Radiology Results: XRay:    02-Feb-16 17:21, Chest Portable Single View  Chest Portable Single View   REASON FOR EXAM:    Right chest pain  COMMENTS:       PROCEDURE: DXR - DXR PORTABLE CHEST SINGLE VIEW  - Sep 30 2014  5:21PM     CLINICAL DATA:  Shortness of breath and right-sided chest pain.  Vomiting.    EXAM:  PORTABLE CHEST - 1 VIEW    COMPARISON:  Report of prior study dated 11/20/2001    FINDINGS:  There is cardiomegaly with pulmonary vascular congestion,  interstitial pulmonary edema, and bilateral pleural effusions, left  greater than right.    Previous median sternotomy.  No acute osseous abnormality.     IMPRESSION:  Findings consistent with congestive heart failure with pulmonary  edema, effusions, and cardiomegaly, and pulmonary vascular  congestion.      Electronically  Signed    By: Rozetta Nunnery M.D.    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 She has been noting increased dyspnea for the past several weeks. Cardiology was consulted 2/2 CXR showing pulmonary edema and effusions.  1. A-fib with RVR: -She has history of chronic a-fib -Last documented sub-therapeutic INR was 08/13/2014 (1.6), has been therapeutic for >3 weeks straight making her a candidate for DCCV -Rate control currently and discuss DCCV with MD  -Blood pressures are soft in the 90s/60s limiting titration of b-blocker or addition of CCB -Could add digoxin 0.25 mg daily for improved rate control (nl renal function)  2. Dyspnea, likely acute CHF: -Echo pending -Possibly 2/2 the  above -IV Lasix 20 mg bid   3. Dysphagia: -GI on board for possible EGD -Will need to hold warfarin until INR <1.5 -PPI  4. Status post MVR: -As above -Check echo  5. Hyponatremia   Plan ATTENDING ATTESTATION I saw & examined the patient earlier this AM with Mr. Idolina Primer, [place]. We reviewed the chart & available data together and discussed the findings, examinatoin & recommendations above.  With chronic Afib - what is not clear is "how long has she been in Afib?" -- I doubt that even if successful DCCV, that we would successfully prevent recurrence. Would probably not need DCCV unless sy deompensates.  Agree with difficule titration of  rate control - dig is a tough choice with borderline renal function.  With pleural effusions & mild dyspnea: recommend supplemental O2 & Incntive spirometer, & diuresis.  I suspect HyponNa+ is most likely hyprevolemic - monitor for abrupt changes.  Will follow up in AM, once echo has been done & read.   Electronic Signatures: Rise Mu (PA-C)  (Signed 03-Feb-16 16:59)  Authored: General Aspect/Present Illness, History and Physical Exam, Review of System, Family & Social History, Past Medical History, Home Medications, Labs, EKG , Radiology, Allergies, Vital Signs/Nurse's Notes, Impression/Plan Leonie Man (MD)  (Signed 03-Feb-16 23:07)  Authored: Impression/Plan  Co-Signer: General Aspect/Present Illness, History and Physical Exam, Review of System, Family & Social History, Past Medical History, Home Medications, Labs, EKG , Radiology, Allergies, Vital Signs/Nurse's Notes, Impression/Plan   Last Updated: 03-Feb-16 23:07 by Leonie Man (MD)

## 2014-12-28 NOTE — Discharge Summary (Signed)
PATIENT NAME:  [name], [name] MR#:  [id number] DATE OF BIRTH:  July 05, 1946  DATE OF ADMISSION:  09/30/2014 DATE OF DISCHARGE:  10/03/2014  ADMITTING PHYSICIAN: Sona A. Posey Pronto, MD  DISCHARGING PHYSICIAN: Gladstone Lighter, MD   PRIMARY CARE PHYSICIAN: Owens Loffler, MD  The patient is being transferred to [place]:  1. Cardiology consultation by Dr. Fletcher Anon.  2. GI consultation by Dr. Candace Cruise.  3. Nephrology consultation by Dr. Juleen China.  4.   ENT consultation by Dr. Carloyn Manner.   DISCHARGE DIAGNOSES:  1. Acute respiratory failure.  2. Acute systolic heart failure, ejection fraction of 10%.  3. Fall and bilateral nasal bone fractures in the hospital.  4. Atrial fibrillation.  5. History of mitral valve replacement surgery with a bioprosthetic valve.  6. Hypothyroidism.  7. Dyspepsia.  8. 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   DISCHARGE MEDICATIONS:  1. Synthroid 50 mcg p.o. daily.  2. Tylenol 650 mg p.r.n. for pain or fever.  3. Maalox 30 mL q.6 hours as needed for dyspepsia.  4. Digoxin 0.25 mg daily. This needs to be given after the digoxin level is normalized. 5. Morphine 2 mg IV q.4 hours p.r.n. for severe pain.  6. Zofran 4 mg IV q.4 hours p.r.n. for nausea and vomiting.  7. Aspirin 81 mg p.o. daily.  8. Coreg 3.125 mg p.o. b.i.d.  9. Mupirocin topical intranasal twice a day.  10. Lasix 20 mg IV b.i.d.  11. Dobutamine drip to be titrated to keep MAP greater than 60.  12. Protonix 40 mg p.o. b.i.d.  13. Colace 100 mg p.o. b.i.d. p.r.n. for constipation.  14. Senna 1 tablet p.o. b.i.d. p.r.n. for constipation.   DISCHARGE DIET: Low-sodium diet.   DISCHARGE OXYGEN: 2 liters humidified O2 for the nasal fractures.   DISCHARGE ACTIVITY: No exertional activity.    FOLLOWUP INSTRUCTIONS:  1. Plan is patient is being transferred to [facility] cardiology team for treatment of  advanced congestive heart failure and consideration of right heart, left heart catheter and LVAD placement.  2. Digoxin level to be rechecked in the a.m.  3. Coumadin is on hold for possible procedures.   LABORATORIES AND IMAGING STUDIES PRIOR TO TRANSFER:  1. INR is 3.5. Digoxin level was elevated at 3.0.  2. Sodium 138, potassium 4.2, chloride 105, bicarbonate 26, BUN 8, creatinine 0.81 glucose 65, calcium of 7.8. Magnesium 2.3, phosphorus 3.6.  3. WBC 9.0, hemoglobin 12.3, hematocrit 37.0, platelet count 172.  4. Chest x-ray showing improved pulmonary edema, residual bilateral pleural effusions, mild bibasilar atelectasis noted.  5. Barium swallow showing normal exam, limited exam. Esophagus is widely patent.  6. CT of the head without contrast done after the fall showing no acute intracranial process, minimally displaced bilateral nasal bone fractures and remote, small left cerebellar infarct noted.  7. Echocardiogram showing LV ejection fraction is 10%, severely decreased global LV systolic function, severely increased LV internal cavity size, restrictive pattern of LV diastolic filling, moderately reduced RV systolic function, moderate pleural effusion in the left lateral region noted.  8. CT of the chest done on admission showing no evidence of PE, evidence of right heart failure, mitral valve stenosis, large bilateral pleural effusions, and passive atelectasis bilaterally noted. Interstitial pulmonary edema noted.   BRIEF HOSPITAL COURSE: Mr. Claude Swendsen is a year-old Caucasian female with past medical history significant for hypothyroidism, bioprosthetic mitral valve replacement surgery, hypertension, who was brought  in from home secondary to right-sided pleuritic chest pain.   1. Acute respiratory failure secondary to acute systolic congestive heart failure exacerbation. The patient has mild cognitive deficits and most of the history obtained from husband, since the patient was very active  up until a year ago and slowly started to get weaker and weaker, unable to perform daily activities. Echo showed ejection fraction of 10%. She has significant bilateral pleural effusions. Her blood pressure has been so low that she was not able to get any beta blocker or a diuretic agent. She was moved over to Intensive Care Unit, placed on dobutamine drip with transient improvement in her blood pressure at this time. The patient was seen by cardiologist team and they are considering transferring her to [facility] for advanced congestive heart failure treatment and consideration of left ventricular assist device placement. She will need a right heart catheterisation and left heart catheterisation to rule out ischemic causes for her cardiomyopathy. Could be tachycardia mediated cardiomyopathy as well.  2. Atrial fibrillation, again, new onset. Not sure how long she has been in atrial fibrillation, but not symptomatic at this time. Heart rate has been pretty elevated. Unable to give any beta blockers due to her low blood pressure. She received 3 doses of digoxin orally yesterday. Digoxin level is elevated. Heart rate is controlled at this time. Continue further management per cardiology team. She was on Coumadin as an outpatient. INR is 2.5, but it is being suspended at this time in anticipation of procedures 3. Hyponatremia. Sodium was 115 on admission. Partly primary polydipsia. The patient drinks up to 4 liters of fluid every day, and also partly from her congestive heart failure. She received 500cc fluid bolus overall in the hospital and she was placed on fluid restriction. Sodium has improved and it is normalized at this time.  4. History of mitral valve replacement surgery with bioprosthetic valve, stable at this time. .  5. Dyspepsia. Since admission to the hospital, the patient has been complaining of significant dyspepsia, especially when eating. Seen by gastroenterology. Placed on proton pump  inhibitor b.i.d. No procedures at this time, unless urgently indicated with the active cardiac issues going on. She did have some improvement in her dyspepsia today.  6. Fall and bilateral nasal bone fractures. The patient, unfortunately, had an unwitnessed fall here in the hospital while her bed alarm was turned off. She had a head CT. No evidence of any hemorrhage noted, especially because she was on Coumadin. However, she did have minimally displaced nasal bone fractures. She was seen by ear, nose and throat. No indication for any surgery at this time. Mildly symptomatic at this time. Just recommend outpatient follow up in 1 week and humidified oxygen through the nose and pain medication as needed.  7. Her course has been otherwise uneventful in the hospital.   DISCHARGE CONDITION: Guarded and critically ill.   DISCHARGE DISPOSITION: To [facility] critical care.   TIME SPENT ON DISCHARGE: 40 minutes.   ____________________________ Gladstone Lighter, MD rk:ap D: 10/03/2014 11:18:26 ET T: 10/03/2014 11:45:35 ET JOB#: [id number]  cc: Gladstone Lighter, MD, <Dictator> Owens Loffler, MD  Gladstone Lighter MD ELECTRONICALLY SIGNED 10/06/2014 17:08

## 2015-01-06 ENCOUNTER — Other Ambulatory Visit: Payer: Medicare Other

## 2015-01-06 ENCOUNTER — Ambulatory Visit (INDEPENDENT_AMBULATORY_CARE_PROVIDER_SITE_OTHER): Payer: Medicare Other

## 2015-01-06 ENCOUNTER — Other Ambulatory Visit: Payer: Self-pay

## 2015-01-06 DIAGNOSIS — Z5181 Encounter for therapeutic drug level monitoring: Secondary | ICD-10-CM

## 2015-01-06 DIAGNOSIS — I4891 Unspecified atrial fibrillation: Secondary | ICD-10-CM

## 2015-01-06 DIAGNOSIS — Z7901 Long term (current) use of anticoagulants: Secondary | ICD-10-CM

## 2015-01-06 DIAGNOSIS — I4892 Unspecified atrial flutter: Secondary | ICD-10-CM

## 2015-01-06 LAB — POCT INR: INR: 5.9

## 2015-01-14 ENCOUNTER — Ambulatory Visit (INDEPENDENT_AMBULATORY_CARE_PROVIDER_SITE_OTHER): Payer: Medicare Other

## 2015-01-14 DIAGNOSIS — I4891 Unspecified atrial fibrillation: Secondary | ICD-10-CM

## 2015-01-14 DIAGNOSIS — Z7901 Long term (current) use of anticoagulants: Secondary | ICD-10-CM | POA: Diagnosis not present

## 2015-01-14 DIAGNOSIS — Z5181 Encounter for therapeutic drug level monitoring: Secondary | ICD-10-CM | POA: Diagnosis not present

## 2015-01-14 DIAGNOSIS — I4892 Unspecified atrial flutter: Secondary | ICD-10-CM | POA: Diagnosis not present

## 2015-01-14 LAB — POCT INR: INR: 2.9

## 2015-01-15 ENCOUNTER — Telehealth: Payer: Self-pay | Admitting: *Deleted

## 2015-01-15 NOTE — Telephone Encounter (Signed)
-----   Message from Leonie Man, MD sent at 01/12/2015  6:14 PM EDT ----- Echo shows improved EF up from 15% to 25-30% (Myoview suggested ~35%).   Moderate MR noted.    This is partial recovery of function, but not as good as hoped.  We will need to continue to treat low EF & avoid CHF exacerbations.   Will need to discuss potential referral to EP for ICD consideration.  DH  Am forwarding result to Dr. Caryl Comes (EP covering [place]) as Juluis Rainier

## 2015-01-15 NOTE — Telephone Encounter (Signed)
-----   Message from Leonie Man, MD sent at 12/10/2014 12:02 PM EDT ----- PFTs are for the most part normal. With amiodarone, we closely monitored DLCO. When we are concerned with toxicity this is reduced. In her case the levels are increased. This is probably related to her global dysfunction and recent heart failure admission.  As long as she is on amiodarone and we will follow these annually to make sure that DLCO does not worsen significantly

## 2015-01-15 NOTE — Telephone Encounter (Signed)
Spoke to patient. PFT AND ECHO Result given . Verbalized understanding KEEP APPOINTMENT WITH DR Ellyn Hack 02/04/15

## 2015-01-16 ENCOUNTER — Telehealth: Payer: Self-pay

## 2015-01-16 NOTE — Telephone Encounter (Signed)
L MOM for pt to schedule NP appt with Dr. Caryl Comes (per echo results)

## 2015-01-16 NOTE — Telephone Encounter (Signed)
-----   Message from [name], MD sent at 01/12/2015  8:25 PM EDT ----- [name] seems appropriate    If you want me to see her,.. S  Can you schedule please  thx  sk

## 2015-01-27 ENCOUNTER — Encounter: Payer: Self-pay | Admitting: Internal Medicine

## 2015-01-27 ENCOUNTER — Ambulatory Visit (INDEPENDENT_AMBULATORY_CARE_PROVIDER_SITE_OTHER): Payer: Medicare Other | Admitting: Internal Medicine

## 2015-01-27 VITALS — BP 122/60 | HR 64 | Ht 69.5 in | Wt 137.8 lb

## 2015-01-27 DIAGNOSIS — I4891 Unspecified atrial fibrillation: Secondary | ICD-10-CM | POA: Diagnosis not present

## 2015-01-27 DIAGNOSIS — R002 Palpitations: Secondary | ICD-10-CM | POA: Diagnosis not present

## 2015-01-27 MED ORDER — POTASSIUM CHLORIDE CRYS ER 20 MEQ PO TBCR: 20.0000 meq | EXTENDED_RELEASE_TABLET | Freq: Two times a day (BID) | ORAL | Status: DC

## 2015-01-27 MED ORDER — CARVEDILOL 3.125 MG PO TABS: 3.1250 mg | ORAL_TABLET | Freq: Two times a day (BID) | ORAL | Status: DC

## 2015-01-27 NOTE — Patient Instructions (Addendum)
Medication Instructions:  Your physician has recommended you make the following change in your medication:  1) START taking carvedilol 3.125mg  twice a day 2) TAKE amiodarone 200mg  once a day as instructed.   Labwork: Your physician recommends that you have lab work today: CMET and TSH   Testing/Procedures: none  Follow-Up: Your physician recommends that you schedule a follow-up appointment in: 4 weeks with Dr. Ellyn Hack.   Any Other Special Instructions Will Be Listed Below (If Applicable).

## 2015-01-27 NOTE — Progress Notes (Signed)
ELECTROPHYSIOLOGY CONSULT NOTE  Patient ID: [name], MRN: [id number], DOB/AGE: 03-Jul-1946 69 y.o. Admit date: (Not on file) Date of Consult: 01/27/2015  Primary Physician: Owens Loffler, MD Primary Cardiologist: [facility]  Chief Complaint: ICD   HPI [name] is a 69 y.o. 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 Rx w amio and holding sinus for now  Interval improvement in EF25-30% with rate and rhythm control;  Low dose ACE initiated, not on BB or Aldosterone antagonists   Her sob is modest with scant edema  She is imprved since cardioversion  She has a  poor understanding of her condition Past Medical History  Diagnosis Date  . H/O: rheumatic fever     Childhood  . Rheumatic mitral and aortic valve insufficiency 2005    a) s/p MV Ring repair; with Cox-Maze for Afib; b) Echo 2013: EF 60-65%,no Regional WMA, Gr 2 DD, no Sig MR ;; c) TEE 10/07/2014: Severlely thickened and calcified MV leaflets. The posterior leaflet is fixed and immobile. There is mitral annular calcification. There is reduced excursion of the anterior mitral valve leaflet. Mean MV gradient was 106mm Hg and MVA calculated at cm2.    Marland Kitchen CVA (cerebral infarction)     h/o superior cerebellar infarct  . Paroxysmal atrial fibrillation 2005; Recurrent 09/2014    a) 2005: s/p Cox Maze & LAA ligation; b) 09/2014: TEE-cardioversion  . Atrial fibrillation and flutter 09/2014    Revereted from Afib RVR to 2:1 A Flutter -- TEE/ DCCV 2/9; on Amiodarone  . 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 Digoxin toxicity 09/2014  . Acne rosacea   . Diverticulosis   . Osteopenia   . Anxiety   . Depression   . Urinary incontinence   . Asthma   . Hypothyroid     On replacement therapy; normal TSH 09/2014  . Migraine headache   . HYPERLIPIDEMIA 04/05/2010    Qualifier: Diagnosis of  By: Lorelei Pont MD, [place]        Surgical History:  Past Surgical History  Procedure Laterality Date  . Mitral valve repair  2005    with [facility] Maze for [employer]  . Cardiac catheterization  Jan 2005    Pre-op R&LHC -- Nonobstructive CAD; normal PA pressures  . Cardioversion  06/04/2012    Procedure: CARDIOVERSION;  Surgeon: Carlena Bjornstad, MD;  Location: [facility] ENDOSCOPY;  Service: Cardiovascular;  Laterality: N/A;  . Right heart catheterization N/A 10/03/2014    Procedure: RIGHT HEART CATH;  Surgeon: Jolaine Artist, MD;  Location: [facility] CATH LAB;  Service: Cardiovascular;  Laterality: N/A;  . Tee without cardioversion N/A 10/07/2014    Procedure: TRANSESOPHAGEAL ECHOCARDIOGRAM (TEE);  Surgeon: Sueanne Margarita, MD;  Location: [place];  Service: Cardiovascular;  Laterality: N/A;  .  Cardioversion N/A 10/07/2014    Procedure: CARDIOVERSION;  Surgeon: Sueanne Margarita, MD;  Location: [facility] ENDOSCOPY;  Service: Cardiovascular;  Laterality: N/A;  . Transthoracic echocardiogram  2013    EF 60-65%, mild MR, Gr 2 DD  . Transthoracic echocardiogram  09/30/2014    [facility]: EF ~10% - severely decreased global LV Systolic function, severely increased LV internal cavity size with restrictive filling pattern - Gr 3 DD, moderately reduced RV function, severely dilated LA - 6.4 cm, moderately dilated RA, moderate pleural effusion, mild and mass, moderate MR, mild TR  . Lumbar disc surgery      x 2 distantly  . Thyroidectomy, partial      partial-no  cancer; now on thyroid replacement  . Abdominal hysterectomy  1980s    (no cancer),ovaries present     Home Meds: Prior to Admission medications   Medication Sig Start Date End Date Taking? Authorizing Provider  amiodarone (PACERONE) 200 MG tablet Take 1 tablet (200 mg total) by mouth daily. 12/26/14   Leonie Man, MD  levothyroxine (SYNTHROID, LEVOTHROID) 50 MCG tablet Take 1 tablet (50 mcg total) by mouth daily. 10/22/14   Owens Loffler, MD  lisinopril (PRINIVIL,ZESTRIL) 2.5 MG tablet Take 1 tablet (2.5 mg total) by mouth at bedtime. 10/22/14   Owens Loffler, MD  potassium chloride SA (K-DUR,KLOR-CON) 20 MEQ tablet Take 1 tablet (20 mEq total) by mouth 2 (two) times daily. 10/08/14   Amy D Ninfa Meeker, NP  warfarin (COUMADIN) 5 MG tablet Take 1-1.5 tablets (5-7.5 mg total) by mouth daily at 6 PM. 10/08/14   Amy D Ninfa Meeker, NP     Allergies: No Known Allergies  History   Social History  . Marital Status: Married    Spouse Name: N/A  . Number of Children: 2  . Years of Education: N/A   Occupational History  . Retired    Social History Main Topics  . Smoking status: Never Smoker   . Smokeless tobacco: Never Used  . Alcohol Use: No  . Drug Use: No  . Sexual Activity: Not on file   Other Topics Concern  . Not on file   Social History Narrative     Family History  Problem Relation Age of Onset  . Diabetes Mother   . Coronary artery disease Mother   . Kidney disease Mother      ROS:  Please see the history of present illness.     All other systems reviewed and negative.    Physical Exam:   Blood pressure 122/60, pulse 64, height 5' 9.5" (1.765 m), weight 137 lb 12 oz (62.483 kg). General: Well developed, well nourished female in no acute distress. Head: Normocephalic, atraumatic, sclera non-icteric, no xanthomas, nares are without discharge. EENT: normal Lymph Nodes:  none Back: without scoliosis/kyphosis, no CVA tendersness Neck: Negative for carotid bruits. JVD not  elevated. Lungs: Clear bilaterally to auscultation without wheezes, rales, or rhonchi. Breathing is unlabored. Heart: RRR with S1 S2. 2*/6 systolic murmur , rubs, or gallops appreciated. Hyperdynamic PMI Abdomen: Soft, non-tender, non-distended with normoactive bowel sounds. No hepatomegaly. No rebound/guarding. No obvious abdominal masses. Msk:  Strength and tone appear normal for age. Extremities: No clubbing or cyanosis. No edema.  Distal pedal pulses are 2+ and equal bilaterally. Skin: Warm and Dry Neuro: Alert and oriented X 3. CN III-XII intact Grossly normal sensory and motor function . Psych:  Responds to questions appropriately with a normal affect.      Labs: Cardiac Enzymes No results  for input(s): CKTOTAL, CKMB, TROPONINI in the last 72 hours. CBC Lab Results  Component Value Date   WBC 5.2 10/08/2014   HGB 11.7* 10/08/2014   HCT 35.3* 10/08/2014   MCV 98.9 10/08/2014   PLT 223 10/08/2014   PROTIME: No results for input(s): LABPROT, INR in the last 72 hours. Chemistry No results for input(s): NA, K, CL, CO2, BUN, CREATININE, CALCIUM, PROT, BILITOT, ALKPHOS, ALT, AST, GLUCOSE in the last 168 hours.  Invalid input(s): LABALBU Lipids Lab Results  Component Value Date   CHOL 190 05/04/2011   HDL 47.00 05/04/2011   LDLCALC 107* 05/04/2011   TRIG 182.0* 05/04/2011   BNP No results found for: PROBNP Thyroid Function Tests: No results for input(s): TSH, T4TOTAL, T3FREE, THYROIDAB in the last 72 hours.  Invalid input(s): FREET3    Miscellaneous No results found for: DDIMER  Radiology/Studies:  No results found.  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 We discussed the physiology of atrial fibrillation and the relationship of  both rate and the loss of atrial contractility to impaired cardiac performance. We discussed the strategies of rate control and rhythm control with the potential of pro arrhythmia. We reviewed side effect  profiles of rate controlling drugs, including their idiosyncratic nature as well as the potential for them to reduce blood pressure.  She is taking amiodarone for rhythm control. I am not sanguine about its long-term benefits with her significant left atrial enlargement; however, is working for now. We talked about its side effects as well as the likely need for repeat cardioversion. I've instructed her on how to take her pulse and to call us if she has persistent irregularity as we would need an ECG as well as likely repeat cardioversion.    We discussed the role of rapid rates as a potential cause for cardiomyopathy. We discussed the impact of atrial fibrillation with its loss of atrial kick, irregularity as well as rates contributing to symptoms of congestive heart failure. We discussed the importance of rate control in  allowing recovery of heart muscle function if in fact the tachycardia is the cause and not the consequence of the cardiomyopathy.  In this regard we discussed rate and rhythm control options.    Her blood pressure is significantly improved from her hospitalization, and this offers [place] an opportunity to augment guidelines directed therapy to help further improve her LV systolic function. At this point we will begin her on carvedilol. We will start her 3.125 twice daily. We reviewed side effects. We will have her follow-up with Dr. [facility] in 4 weeks. At that time, he will have the option of either adding aldosteron  Antagonists  or further uptitrating her beta blocker/ACE inhibitor.  In approximately 3 months time, it would be reasonable to reassess left ventricular function and then determine as to whether it's appropriate to pursue ICD implantation for primary prevention.     Virl Axe

## 2015-01-28 ENCOUNTER — Ambulatory Visit (INDEPENDENT_AMBULATORY_CARE_PROVIDER_SITE_OTHER): Payer: Medicare Other

## 2015-01-28 DIAGNOSIS — I4892 Unspecified atrial flutter: Secondary | ICD-10-CM | POA: Diagnosis not present

## 2015-01-28 DIAGNOSIS — Z7901 Long term (current) use of anticoagulants: Secondary | ICD-10-CM

## 2015-01-28 DIAGNOSIS — I4891 Unspecified atrial fibrillation: Secondary | ICD-10-CM | POA: Diagnosis not present

## 2015-01-28 DIAGNOSIS — Z5181 Encounter for therapeutic drug level monitoring: Secondary | ICD-10-CM

## 2015-01-28 LAB — COMPREHENSIVE METABOLIC PANEL
ALBUMIN: 4.4 g/dL (ref 3.5–4.8)
ALT: 25 IU/L (ref 0–32)
AST: 34 IU/L (ref 0–40)
Albumin/Globulin Ratio: 1.7 (ref 1.1–2.5)
Alkaline Phosphatase: 80 IU/L (ref 39–117)
BUN/Creatinine Ratio: 11 (ref 11–26)
BUN: 9 mg/dL (ref 8–27)
Bilirubin Total: 0.6 mg/dL (ref 0.0–1.2)
CO2: 22 mmol/L (ref 18–29)
Calcium: 9.4 mg/dL (ref 8.7–10.3)
Chloride: 99 mmol/L (ref 97–108)
Creatinine, Ser: 0.79 mg/dL (ref 0.57–1.00)
GFR calc Af Amer: 88 mL/min/{1.73_m2} (ref >59)
GFR calc non Af Amer: 76 mL/min/{1.73_m2} (ref >59)
GLUCOSE: 82 mg/dL (ref 65–99)
Globulin, Total: 2.6 g/dL (ref 1.5–4.5)
Potassium: 4.5 mmol/L (ref 3.5–5.2)
Sodium: 140 mmol/L (ref 134–144)
Total Protein: 7 g/dL (ref 6.0–8.5)

## 2015-01-28 LAB — TSH: TSH: 5.1 u[IU]/mL — ABNORMAL HIGH (ref 0.450–4.500)

## 2015-01-28 LAB — POCT INR: INR: 5.5

## 2015-01-29 ENCOUNTER — Other Ambulatory Visit: Payer: Self-pay

## 2015-01-29 DIAGNOSIS — E039 Hypothyroidism, unspecified: Secondary | ICD-10-CM

## 2015-02-04 ENCOUNTER — Ambulatory Visit: Payer: Medicare Other | Admitting: Cardiology

## 2015-02-04 ENCOUNTER — Ambulatory Visit (INDEPENDENT_AMBULATORY_CARE_PROVIDER_SITE_OTHER): Payer: Medicare Other

## 2015-02-04 DIAGNOSIS — I4892 Unspecified atrial flutter: Secondary | ICD-10-CM | POA: Diagnosis not present

## 2015-02-04 DIAGNOSIS — I4891 Unspecified atrial fibrillation: Secondary | ICD-10-CM

## 2015-02-04 DIAGNOSIS — Z7901 Long term (current) use of anticoagulants: Secondary | ICD-10-CM | POA: Diagnosis not present

## 2015-02-04 DIAGNOSIS — Z5181 Encounter for therapeutic drug level monitoring: Secondary | ICD-10-CM | POA: Diagnosis not present

## 2015-02-04 LAB — POCT INR: INR: 3.8

## 2015-02-18 ENCOUNTER — Ambulatory Visit (INDEPENDENT_AMBULATORY_CARE_PROVIDER_SITE_OTHER): Payer: Medicare Other

## 2015-02-18 DIAGNOSIS — Z5181 Encounter for therapeutic drug level monitoring: Secondary | ICD-10-CM

## 2015-02-18 DIAGNOSIS — I4892 Unspecified atrial flutter: Secondary | ICD-10-CM

## 2015-02-18 DIAGNOSIS — Z7901 Long term (current) use of anticoagulants: Secondary | ICD-10-CM | POA: Diagnosis not present

## 2015-02-18 DIAGNOSIS — I4891 Unspecified atrial fibrillation: Secondary | ICD-10-CM | POA: Diagnosis not present

## 2015-02-18 LAB — POCT INR: INR: 2.6

## 2015-02-23 ENCOUNTER — Ambulatory Visit (INDEPENDENT_AMBULATORY_CARE_PROVIDER_SITE_OTHER): Payer: Medicare Other | Admitting: Family Medicine

## 2015-02-23 ENCOUNTER — Encounter: Payer: Self-pay | Admitting: Family Medicine

## 2015-02-23 VITALS — BP 148/76 | HR 62 | Temp 97.6°F | Ht 70.0 in | Wt 136.0 lb

## 2015-02-23 DIAGNOSIS — F331 Major depressive disorder, recurrent, moderate: Secondary | ICD-10-CM

## 2015-02-23 DIAGNOSIS — F411 Generalized anxiety disorder: Secondary | ICD-10-CM | POA: Diagnosis not present

## 2015-02-23 DIAGNOSIS — I5042 Chronic combined systolic (congestive) and diastolic (congestive) heart failure: Secondary | ICD-10-CM | POA: Diagnosis not present

## 2015-02-23 DIAGNOSIS — I4891 Unspecified atrial fibrillation: Secondary | ICD-10-CM | POA: Diagnosis not present

## 2015-02-23 DIAGNOSIS — I4892 Unspecified atrial flutter: Secondary | ICD-10-CM

## 2015-02-23 NOTE — Progress Notes (Signed)
Pre visit review using our clinic review tool, if applicable. No additional management support is needed unless otherwise documented below in the visit note. 

## 2015-02-23 NOTE — Progress Notes (Signed)
Dr. Frederico Hamman T. Bora Broner, MD, [facility] [place], [address] Phone: [phone] Fax: [phone]  02/23/2015  Patient: [name], MRN: [id number], DOB: [birth year], 69 y.o.  Primary Physician:  Owens Loffler, MD  Chief Complaint: Follow-up  Subjective:   [name] is a 69 y.o. very pleasant female patient who presents with the following:  The patient is feeling much better compared to the last time that I saw her.  She still has some decreased energy, but she is able to do things at home and do general chores around the office.  She is still having some mild memory impairment, but she doesn't think that this is worsening at all.  She has not had any episodes where she has got lost her got confused.  We stopped all of her sedating medications previously.  She is a little bit tearful today in the office.  She has not been getting along all that well with her husband including the argument today on the way over year.  Overall, she is much less depressed than on other times when I'm seeing her.  She has not wanted to be on any medications, and we have visited this multiple times, and she is actually seen 2 different psychiatrists in the past.  Cardiac output is improved, and her EF is about 25-30% on her last echocardiogram.  All of her cardiology notes are reviewed today.  Feeling ok and back doing stuff at home.  Driving ok.  Had a fight with her husband on the way here.   Mild memory impairment is doing OK.  Past Medical History, Surgical History, Social History, Family History, Problem List, Medications, and Allergies have been reviewed and updated if relevant.  Patient Active Problem List   Diagnosis Date Noted  . Mild dementia 10/24/2014    Priority: High  . Generalized anxiety disorder 11/18/2008    Priority: Medium  . Major depressive disorder, recurrent episode, moderate with anxious distress 11/18/2008    Priority: Medium  . Long term current use of antiarrhythmic medical therapy 11/14/2014  . On amiodarone therapy 11/14/2014  . Current use of long term anticoagulation 11/14/2014  . Malnutrition of moderate degree 10/06/2014  . Chronic combined systolic and diastolic CHF, NYHA class 1 09/29/2014  . 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 Atrial fibrillation and flutter 09/29/2014  . HYPERLIPIDEMIA 04/05/2010  . GERD 04/05/2010  . OTHER MALAISE AND FATIGUE 03/31/2010  . 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 Hypothyroid 11/18/2008  . ASTHMA 11/18/2008  . URINARY INCONTINENCE 11/18/2008  . NEPHROLITHIASIS, HX OF 11/18/2008    Past Medical History  Diagnosis Date  . H/O: rheumatic fever     Childhood  . Rheumatic mitral and aortic valve insufficiency 2005    a) s/p MV Ring repair; with Cox-Maze for Afib; b) Echo 2013: EF 60-65%,no Regional WMA, Gr 2 DD, no Sig MR ;; c) TEE 10/07/2014: Severlely thickened and calcified MV leaflets. The posterior leaflet is fixed and immobile. There is mitral annular calcification. There is reduced excursion of the anterior mitral valve leaflet. Mean MV gradient was 93m Hg and MVA calculated at cm2.    .Marland KitchenCVA (cerebral infarction)     h/o superior cerebellar infarct  . Paroxysmal atrial fibrillation 2005; Recurrent 09/2014    a) 2005: s/p Cox Maze & LAA ligation;  b) 09/2014: TEE-cardioversion  . Atrial fibrillation and flutter 09/2014    Revereted from Afib RVR to 2:1 A Flutter -- TEE/ DCCV 2/9; on Amiodarone  . 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 Digoxin toxicity 09/2014  . Acne rosacea   . Diverticulosis   . Osteopenia   . Anxiety   . Depression   . Urinary incontinence   . Asthma   . Hypothyroid     On replacement therapy; normal TSH 09/2014  . Migraine headache   . HYPERLIPIDEMIA 04/05/2010    Qualifier: Diagnosis of  By: Lorelei Pont MD, Frederico Hamman      Past Surgical History  Procedure Laterality Date  . Mitral valve repair  2005    with [facility] Maze for [employer]  . Cardiac catheterization  Jan 2005    Pre-op R&LHC -- Nonobstructive CAD; normal PA pressures  . Cardioversion  06/04/2012    Procedure: CARDIOVERSION;  Surgeon: Carlena Bjornstad, MD;  Location: [facility] ENDOSCOPY;  Service: Cardiovascular;  Laterality: N/A;  . Right heart catheterization N/A 10/03/2014    Procedure: RIGHT HEART CATH;  Surgeon: Jolaine Artist, MD;  Location: [facility] CATH LAB;  Service: Cardiovascular;  Laterality: N/A;  . Tee without cardioversion N/A 10/07/2014    Procedure: TRANSESOPHAGEAL ECHOCARDIOGRAM (TEE);  Surgeon: Sueanne Margarita, MD;  Location: [place];  Service: Cardiovascular;  Laterality: N/A;  . Cardioversion N/A 10/07/2014    Procedure: CARDIOVERSION;  Surgeon: Sueanne Margarita, MD;  Location: [facility] ENDOSCOPY;  Service: Cardiovascular;  Laterality: N/A;  . Transthoracic echocardiogram  2013    EF 60-65%, mild MR, Gr 2 DD  . Transthoracic echocardiogram  09/30/2014    [facility]: EF ~10% - severely decreased global LV Systolic function, severely increased LV internal cavity size with restrictive filling pattern - Gr 3 DD, moderately reduced RV function, severely dilated LA - 6.4 cm, moderately dilated RA, moderate pleural effusion, mild and mass, moderate MR, mild TR  . Lumbar disc surgery      x 2 distantly  . Thyroidectomy, partial      partial-no cancer; now on thyroid  replacement  . Abdominal hysterectomy  1980s    (no cancer),ovaries present    History   Social History  . Marital Status: Married    Spouse Name: N/A  . Number of Children: 2  . Years of Education: N/A   Occupational History  . Retired    Social History Main Topics  . Smoking status: Never Smoker   . Smokeless tobacco: Never Used  . Alcohol Use: No  . Drug Use: No  . Sexual Activity: Not on file   Other Topics Concern  . Not on file   Social History Narrative    Family History  Problem Relation Age of Onset  . Diabetes Mother   . Coronary artery disease Mother   . Kidney disease Mother     No Known Allergies  Medication list reviewed and updated in full in [place].  GEN: No acute illnesses, no fevers, chills. GI: No n/v/d, eating normally Pulm: No SOB Interactive and getting along well at home.  Otherwise, ROS is as per the HPI.  Objective:   BP 148/76 mmHg  Pulse 62  Temp(Src) 97.6 F (36.4 C) (Oral)  Ht 5' 10" (1.778 m)  Wt 136 lb (61.689 kg)  BMI 19.51 kg/m2  SpO2 97%  GEN: WDWN, NAD, Non-toxic, A & O x 3 HEENT: Atraumatic, Normocephalic. Neck supple. No masses, No LAD. Ears and Nose: No external deformity. CV: RRR, No M/G/R. No JVD. No thrill. No extra heart sounds. PULM: CTA B, no wheezes, crackles, rhonchi. No retractions. No resp. distress. No accessory muscle use. EXTR: No c/c/e NEURO Normal gait.  PSYCH: Normally interactive. Conversant. Somewhat tearful.  Laboratory and Imaging Data: Recent Results (from the past 2160 hour(s))  POCT INR     Status: None   Collection Time: 12/03/14 10:33 AM  Result Value Ref Range   INR 1.9   POCT INR     Status: None   Collection Time: 12/24/14 10:13 AM  Result Value Ref Range   INR 4.8   POCT INR     Status: None   Collection Time: 01/06/15 11:24 AM  Result Value Ref Range   INR 5.9   POCT INR     Status: None   Collection Time: 01/14/15 10:52 AM  Result Value Ref Range   INR  2.9   Comp Met (CMET)     Status: None   Collection Time: 01/27/15 10:04 AM  Result Value Ref Range   Glucose 82 65 - 99 mg/dL   BUN 9 8 - 27 mg/dL   Creatinine, Ser 0.79 0.57 - 1.00 mg/dL   GFR calc non Af Amer 76 >59 mL/min/1.73   GFR calc Af Amer 88 >59 mL/min/1.73   BUN/Creatinine Ratio 11 11 - 26   Sodium 140 134 - 144 mmol/L   Potassium 4.5 3.5 - 5.2 mmol/L   Chloride 99 97 - 108 mmol/L   CO2 22 18 - 29 mmol/L   Calcium 9.4 8.7 - 10.3 mg/dL   Total Protein 7.0 6.0 - 8.5 g/dL   Albumin 4.4 3.5 - 4.8 g/dL   Globulin, Total 2.6 1.5 - 4.5 g/dL   Albumin/Globulin Ratio 1.7 1.1 - 2.5   Bilirubin Total 0.6 0.0 - 1.2 mg/dL   Alkaline Phosphatase 80 39 - 117 IU/L   AST 34 0 - 40 IU/L   ALT 25 0 - 32 IU/L  TSH     Status: Abnormal   Collection Time: 01/27/15 10:04 AM  Result Value Ref Range   TSH 5.100 (H) 0.450 - 4.500 uIU/mL  POCT INR     Status: None   Collection Time: 01/28/15 12:32 PM  Result Value Ref Range   INR 5.5   POCT INR     Status: None   Collection Time: 02/04/15  9:49 AM  Result Value Ref Range   INR 3.8   POCT INR     Status: None   Collection Time: 02/18/15 10:00 AM  Result Value Ref Range   INR 2.6      Assessment and Plan:   Atrial fibrillation and flutter  Chronic combined systolic and diastolic CHF, NYHA class 1  Major depressive disorder, recurrent episode, moderate with anxious distress  Generalized anxiety disorder  >25 minutes spent in face to face time with patient, >50% spent in counselling or coordination of care: physically, she seems to be  doing a whole lot better than she was on her prior office visit.  Emotionally, she is doing much better than she has been at other times in the past.  We spent a lot of time today discussing various psychosocial issues that she is undergoing.  More stable, see in 6 months.  Follow-up: Return in about 6 months (around 08/25/2015).  Signed,  Maud Deed. Carsen Leaf, MD   Patient's Medications  New  Prescriptions   No medications on file  Previous Medications   AMIODARONE (PACERONE) 200 MG TABLET    Take 1 tablet (200 mg total) by mouth daily.   CARVEDILOL (COREG) 3.125 MG TABLET    Take 1 tablet (3.125 mg total) by mouth 2 (two) times daily.   LEVOTHYROXINE (SYNTHROID, LEVOTHROID) 50 MCG TABLET    Take 1 tablet (50 mcg total) by mouth daily.   LISINOPRIL (PRINIVIL,ZESTRIL) 2.5 MG TABLET    Take 1 tablet (2.5 mg total) by mouth at bedtime.   POTASSIUM CHLORIDE SA (K-DUR,KLOR-CON) 20 MEQ TABLET    Take 1 tablet (20 mEq total) by mouth 2 (two) times daily.   WARFARIN (COUMADIN) 5 MG TABLET    Take 1-1.5 tablets (5-7.5 mg total) by mouth daily at 6 PM.  Modified Medications   No medications on file  Discontinued Medications   No medications on file

## 2015-03-04 ENCOUNTER — Encounter: Payer: Self-pay | Admitting: Cardiology

## 2015-03-04 ENCOUNTER — Ambulatory Visit (INDEPENDENT_AMBULATORY_CARE_PROVIDER_SITE_OTHER): Payer: Medicare Other | Admitting: Cardiology

## 2015-03-04 ENCOUNTER — Ambulatory Visit (INDEPENDENT_AMBULATORY_CARE_PROVIDER_SITE_OTHER): Payer: Medicare Other | Admitting: Pharmacist

## 2015-03-04 VITALS — BP 140/84 | HR 54 | Ht 69.5 in | Wt 141.5 lb

## 2015-03-04 DIAGNOSIS — Z7901 Long term (current) use of anticoagulants: Secondary | ICD-10-CM | POA: Diagnosis not present

## 2015-03-04 DIAGNOSIS — I43 Cardiomyopathy in diseases classified elsewhere: Secondary | ICD-10-CM

## 2015-03-04 DIAGNOSIS — I5042 Chronic combined systolic (congestive) and diastolic (congestive) heart failure: Secondary | ICD-10-CM | POA: Diagnosis not present

## 2015-03-04 DIAGNOSIS — I480 Paroxysmal atrial fibrillation: Secondary | ICD-10-CM | POA: Diagnosis not present

## 2015-03-04 DIAGNOSIS — Z79899 Other long term (current) drug therapy: Secondary | ICD-10-CM

## 2015-03-04 DIAGNOSIS — R000 Tachycardia, unspecified: Secondary | ICD-10-CM

## 2015-03-04 DIAGNOSIS — Z5181 Encounter for therapeutic drug level monitoring: Secondary | ICD-10-CM | POA: Diagnosis not present

## 2015-03-04 DIAGNOSIS — I058 Other rheumatic mitral valve diseases: Secondary | ICD-10-CM

## 2015-03-04 DIAGNOSIS — E785 Hyperlipidemia, unspecified: Secondary | ICD-10-CM

## 2015-03-04 DIAGNOSIS — I4891 Unspecified atrial fibrillation: Secondary | ICD-10-CM

## 2015-03-04 DIAGNOSIS — I059 Rheumatic mitral valve disease, unspecified: Secondary | ICD-10-CM | POA: Diagnosis not present

## 2015-03-04 DIAGNOSIS — I4892 Unspecified atrial flutter: Secondary | ICD-10-CM

## 2015-03-04 LAB — POCT INR: INR: 4.5

## 2015-03-04 MED ORDER — FUROSEMIDE 20 MG PO TABS: 20.0000 mg | ORAL_TABLET | Freq: Every day | ORAL | Status: DC

## 2015-03-04 MED ORDER — LISINOPRIL 5 MG PO TABS: 20.0000 mg | ORAL_TABLET | Freq: Every day | ORAL | Status: DC

## 2015-03-04 MED ORDER — LISINOPRIL 5 MG PO TABS: 5.0000 mg | ORAL_TABLET | Freq: Every day | ORAL | Status: DC

## 2015-03-04 NOTE — Assessment & Plan Note (Signed)
Amazingly, she appears to be almost completely asymptomatic as long as she is out of A. fib. She does not act like someone is EF is 25-30%. Plan:  Unable to titrate carvedilol any further based on sinus bradycardia with 1 AV block  Gradually titrate lisinopril: Increase now to 5 mg daily at bedtime, blood pressure rechecked next week for INR recheck.   Provided she isn't not having signs of orthostatic hypotension or dizziness, and systolic blood pressure greater than 130, will increase to 7.5 mg daily at bedtime with plans to further increase to 10 mg goal.   At that point, depending on what her echo looks like we may consider switching to [facility]  We will prescribe when necessary Lasix  With no volume overload component, I will not start aldosterone antagonists

## 2015-03-04 NOTE — Assessment & Plan Note (Signed)
On warfarin. INR supratherapeutic at 4.5 today. Dose being titrated by Coumadin clinic

## 2015-03-04 NOTE — Assessment & Plan Note (Signed)
Maintaining sinus rhythm on amiodarone. -- Per Dr. Caryl Comes, they had a long discussion about rate versus rhythm control. Clearly with her diastolic dysfunction component as well as low EF, she will not tolerate even rate controlled A. fib. I think rhythm control as her only real option.  I will defer to Dr. Caryl Comes as whether or not he would consider Tikosyn or sotalol over amiodarone. Anticoagulated with warfarin. Currently supratherapeutic - dose titrated by Coumadin clinic RN/pharmacist

## 2015-03-04 NOTE — Patient Instructions (Signed)
Medication Instructions:  Your physician has recommended you make the following change in your medication:  INCREASE your lisinopril to 5mg  once per day. Take morning dose of coreg after you eat breakfast START lasix 20mg  once daily AS NEEDED for shortness of breath   Labwork: none  Testing/Procedures: Your physician has requested that you have an echocardiogram in December. Echocardiography is a painless test that uses sound waves to create images of your heart. It provides your doctor with information about the size and shape of your heart and how well your heart's chambers and valves are working. This procedure takes approximately one hour. There are no restrictions for this procedure.    Follow-Up: Your physician wants you to follow-up in: six months with Dr. Ellyn Hack. You will receive a reminder letter in the mail two months in advance. If you don't receive a letter, please call our office to schedule the follow-up appointment.   Any Other Special Instructions Will Be Listed Below (If Applicable). 7/12 INR check and BP check.

## 2015-03-04 NOTE — Assessment & Plan Note (Signed)
Thyroid levels being followed by Dr. Caryl Comes. Baseline PFTs obtained in March. Will recheck next March if she remains on amiodarone

## 2015-03-04 NOTE — Assessment & Plan Note (Signed)
Well-seated valve ring but moderate MR. Hopefully with improved LVEF volume and potentially EF with better afterload reduction, her MR will improve.  Plan: Recheck echo in December. Continue to titrate ACE inhibitor for afterload reduction

## 2015-03-04 NOTE — Progress Notes (Signed)
PCP: Owens Loffler, MD  Clinic Note: Chief Complaint  Patient presents with  . other    3 month f/u no complaints. Meds reviewed verbally with pt.    HPI: [name] is a 69 y.o. female with a complicated cardiac history (see below) presents today for 3-4 month follow-up for her PAF, nonischemic cardiomyopathy and history of mitral valve replacement.   She was last seen by me on 3/16/'16: Was in sinus rhythm on amiodarone. No active heart failure symptoms. Tolerating ACE inhibitor, but not on beta blocker due to amiodarone. Plan was to reassess echo in 2-3 months.  PRIOR CARDIAC HISTORY 2004-2013: 1. 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 Percival Spanish 11/2002 for persistent Afib 1. Echo 07/2003: Mod-Severe MR with Bileaflet MVP, Marked LA dilation, moderate LV Dilation but normal function 2. Jan 2005: Progressive LV dilation & worsening MR --> referred for MVR & Cox Maze 3. 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 Feb 2005: s/p mitral valve repair with #34 mm Seguin annuloplasty ring, quadrangular resection of posterior mitral valve leaflets X2 with sliding leaflet annuloplasty, resection of fibrotic chordae tendineae with replacement with artificial Gore-Tex cords to anterior leaflet X2; Cox Maze and ligation of the left atrial appendage 2. Chronic AFib - s/p Cox Maze ablation 1. Echo 2012: EF 60-65%, Gr 2 DD, no significant MR 3. CVA - superior cerebellar 4. Seen by Cardiology in 2013 -- 2:1 Aflutter --> DCCV by Dr. Ron Parker 06/04/2012 5. Recurrence of A. fib with RVR - admitted to [facility] 09/30/2014 with acute combined systolic and diastolic heart failure in the setting of A. fib RVR, extremely volume overloaded. 2. 2D Echo: (EF ~10% - severely decreased global LV Systolic function, severely increased LV internal cavity size with restrictive filling pattern - Gr 3 DD, moderately reduced RV function, severely dilated LA - 6.4 cm, moderately dilated  RA, moderate pleural effusion, mild and mass, moderate MR, mild TR).  6. She was aggressively diuresed with IV Lasix  7. Converted to Aflutter 2:1 rates ~120s 8. Transferred to [facility] (hypNatremic, AMS) -- Amiodarone gtt --> TEE/DCCV. 1. RHC: performed (see PSH)   Studies Reviewed: Updated in PMH/PSH  Myoview 12/04/2014:  EF 37% with septal hypokinesis and diffuse hypokinesis. No ischemia or infarction. 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  2D Echo 01/06/2015: EF 25-30%, moderate LV dilation with severely reduced EF. No RWMA, Gr 3 DD with high LAP. MV sewing ring normal with moderate MR. Severe LA dilation. Normal PA pressures.   Seen by Dr. Caryl Comes on May 31: -- He basically discussed rate versus rhythm control. Discussed the effects of A. fib on cardiomyopathy either as a cause of or exacerbation of heart failure. Carvedilol 3.125 mg twice a day was started, with the suggestion of adding an aldosteronism antagonists versus titrating beta blocker/ACE inhibitor (i.e. Standard CHF medication titration).  He then suggested 3 months following this we should recheck an echocardiogram to determine appropriateness of ICD implantation for primary prevention.  Seen by PCP. Noted to be anxious with symptoms of major depressive disorder/generalized anxiety disorder. Having social troubles.   Interval History: She presents today doing quite well. Less anxious than usual. She is feeling "fine" denies any sensation of rapid irregular heartbeats. No recurrence of dyspnea with rest or exertion. No PND, orthopnea or even edema. She is active in her day-to-day life, but no routine exercise.  Cardiovascular ROS: no  chest pain or dyspnea on exertion positive for - murmur negative for - edema, irregular heartbeat, loss of consciousness, orthopnea, palpitations, paroxysmal nocturnal dyspnea, rapid heart rate, shortness of breath or TIA/amaurosis fugax.  Syncope/near syncope   Past Medical History  Diagnosis Date  . H/O: rheumatic fever     Childhood  . Rheumatic mitral and aortic valve insufficiency 2005    a) s/p MV Ring repair; with Cox-Maze for Afib; b) Echo 2013: EF 60-65%,no Regional WMA, Gr 2 DD, no Sig MR ;; c) TEE 10/07/2014: Severlely thickened and calcified MV leaflets. Posterior MV leaflet is fixed and immobile. MAC. reduced excursion of the anterior MV leaflet. Mean MV gradient was 80mm Hg and MVA calculated at cm2.    [facility] CVA (cerebral infarction)     h/o superior cerebellar infarct  . Paroxysmal atrial fibrillation 2005; Recurrent 09/2014    a) 2005: s/p Cox Maze & LAA ligation; b) 09/2014: TEE-cardioversion  . Atrial fibrillation and flutter 09/2014    Revereted from Afib RVR to 2:1 A Flutter -- TEE/ DCCV 2/9; on Amiodarone  . 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 Digoxin toxicity 09/2014  . Acne rosacea   . Diverticulosis   . Osteopenia   . Anxiety   . Depression   . Urinary incontinence   . Asthma   . Hypothyroid     On replacement therapy; normal TSH 09/2014  . Migraine headache   . HYPERLIPIDEMIA 04/05/2010    Qualifier: Diagnosis of  By: Lorelei Pont MD, Frederico Hamman      Past Surgical History  Procedure Laterality Date  . Mitral valve repair  2005    with Cox Maze for [employer]  . Cardiac catheterization  Jan 2005    Pre-op R&LHC -- Nonobstructive CAD; normal PA pressures  . Cardioversion  06/04/2012    Procedure:  CARDIOVERSION;  Surgeon: Carlena Bjornstad, MD;  Location: [facility] ENDOSCOPY;  Service: Cardiovascular;  Laterality: N/A;  . Right heart catheterization N/A 10/03/2014    Procedure: RIGHT HEART CATH;  Surgeon: Jolaine Artist, MD;  Location: [facility] CATH LAB;  Service: Cardiovascular;  RAP 71mmHg, RVP 35/2/9 mmHg, PAP 45/12 mmHg, PCWP 16 mmHg; CO/I by Fick: 3.9/2.3; Ao/PA/SVC SaO2%: 97%/63%/65%.  Darden Dates without cardioversion N/A 10/07/2014    Procedure: TRANSESOPHAGEAL ECHOCARDIOGRAM (TEE);  Surgeon: Sueanne Margarita, MD;  Location: [place];  Service: Cardiovascular;  Laterality: N/A;  . Cardioversion N/A 10/07/2014    Procedure: CARDIOVERSION;  Surgeon: Sueanne Margarita, MD;  Location: [facility] ENDOSCOPY;  Service: Cardiovascular;  Laterality: N/A;  . Transthoracic echocardiogram  2013; 2/'16 & 5/'16    a) 2013: EF 60-65%, mild MR, Gr 2 DD; b) 2/'16 @ [place]: EF ~10% - severel global LV dysfunction (systolic & diastolic) - dilated LV & restrictive filling pattern - Gr 3 DD, mod reduced RV function, severely dilated LA - 6.4 cm, mod dilated RA, mod pl effusion, mod MR, mild TR; c) 5/10/'16:  EF  25-30%, mod LV dilation, no RWMA,Gr 3 DD w/ high LAP, MV sewing ring intact w/ Mod MR, Severe LA dilation  . Lumbar disc surgery      x 2 distantly  . Thyroidectomy, partial      partial-no cancer; now on thyroid replacement  . Abdominal hysterectomy  1980s    (no cancer),ovaries present  . Nm myoview ltd  4/7/'16    EF 37% with septal hypokinesis and diffuse hypokinesis. No ischemia or infarction. 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   No Known Allergies   Current Outpatient Prescriptions on File Prior to Visit  Medication Sig Dispense Refill  . amiodarone (PACERONE) 200 MG tablet Take 1 tablet (200 mg total) by mouth daily. 30 tablet 3  . carvedilol (COREG) 3.125 MG tablet Take 1 tablet (3.125 mg total) by mouth 2 (two) times daily. 60 tablet 5  . levothyroxine  (SYNTHROID, LEVOTHROID) 50 MCG tablet Take 1 tablet (50 mcg total) by mouth daily. 30 tablet 11  . potassium chloride SA (K-DUR,KLOR-CON) 20 MEQ tablet Take 1 tablet (20 mEq total) by mouth 2 (two) times daily. 60 tablet 6  . warfarin (COUMADIN) 5 MG tablet Take 1-1.5 tablets (5-7.5 mg total) by mouth daily at 6 PM. 60 tablet 6   No current facility-administered medications on file prior to visit.   Social and Family History:  reports that she has never smoked. She has never used smokeless tobacco. She reports that she does not drink alcohol or use illicit drugs. family history includes Coronary artery disease in her mother; Diabetes in her mother; Kidney disease in her mother.   ROS: A comprehensive was performed. Review of Systems  Constitutional: Negative for weight loss and malaise/fatigue.  HENT: Negative for nosebleeds.   Respiratory: Negative for cough and shortness of breath.   Cardiovascular: Negative for claudication.  Gastrointestinal: Negative for blood in stool and melena.  Genitourinary: Negative for hematuria.  Musculoskeletal: Negative for falls.  Neurological: Negative for dizziness and focal weakness.  Endo/Heme/Allergies: Does not bruise/bleed easily.  Psychiatric/Behavioral: The patient is nervous/anxious.   All other systems reviewed and are negative.   Wt Readings from Last 3 Encounters:  03/04/15 64.184 kg (141 lb 8 oz)  02/23/15 61.689 kg (136 lb)  01/27/15 62.483 kg (137 lb 12 oz)    PHYSICAL EXAM BP 140/84 mmHg  Pulse 54  Ht 5' 9.5" (1.765 m)  Wt 64.184 kg (141 lb 8 oz)  BMI 20.60 kg/m2 General appearance: alert, cooperative, appears stated age, no distress and Well groomed, well nourished. Pleasant mood and affect. - Notably less anxious.  HEENT: [place]/AT, EOMI, MMM, anicteric sclera Neck: no adenopathy, no carotid bruit, no JVD, supple, symmetrical, trachea midline;Thyroidectomy scar Lungs: clear to auscultation bilaterally, normal percussion  bilaterally and Nonlabored, good movement Heart: RRR (borderline bradycardic), normal S1 and S2. Soft HSM at apex. No R./G. Nondisplaced PMI. Abdomen: soft, non-tender; bowel sounds normal; no masses, no organomegaly Extremities: extremities normal, atraumatic, no cyanosis or edema and no edema, redness or tenderness in the calves or thighs Pulses: 2+ and symmetric Skin: Skin color, texture, turgor normal. No rashes or lesions Neurologic: Grossly normal    Adult ECG Report  Rate: 54 ;  Rhythm: sinus bradycardia with 1 A-V block (PR interval 206) and Nonspecific ST-T wave abnormalities in lateral leads. Normal axis  borderline prolonged QTC of 494.   Narrative Interpretation: Stable EKG   Other studies Reviewed: Additional studies/ records that were  reviewed today include: Echo & Myoview above Recent Labs:   Lab Results  Component Value Date   CREATININE 0.79 01/27/2015   This SmartLink has not been configured with any valid records.   TSH was 5.1  - plan to recheck in July   ASSESSMENT / PLAN: Problem List Items Addressed This Visit    Chronic combined systolic and diastolic CHF, NYHA class 1 (Chronic)    Amazingly, she appears to be almost completely asymptomatic as long as she is out of A. fib. She does not act like someone is EF is 25-30%. Plan:  Unable to titrate carvedilol any further based on sinus bradycardia with 1 AV block  Gradually titrate lisinopril: Increase now to 5 mg daily at bedtime, blood pressure rechecked next week for INR recheck.   Provided she isn't not having signs of orthostatic hypotension or dizziness, and systolic blood pressure greater than 130, will increase to 7.5 mg daily at bedtime with plans to further increase to 10 mg goal.   At that point, depending on what her echo looks like we may consider switching to [facility]  We will prescribe when necessary Lasix  With no volume overload component, I will not start aldosterone antagonists       Relevant Medications   furosemide (LASIX) 20 MG tablet   lisinopril (PRINIVIL,ZESTRIL) 5 MG tablet   Other Relevant Orders   EKG 12-Lead (Completed)   Echocardiogram   Current use of long term anticoagulation (Chronic)    On warfarin. INR supratherapeutic at 4.5 today. 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 Admittedly she was not on target doses of any medications.  Plan per Dr. Caryl Comes would be to continue to titrate ACE inhibitor plus or minus beta blocker that he recently added and reassess EF after giving more time for recovery. Plan: Recheck echo in roughly December timeframe. She is indicated that she probably would prefer not to consider ICD placement at this time.      Relevant Medications   furosemide (LASIX) 20 MG tablet   lisinopril (PRINIVIL,ZESTRIL) 5 MG tablet   Other Relevant Orders   EKG 12-Lead (Completed)   Echocardiogram   Hyperlipidemia LDL goal <100 (Chronic)    Labs monitored by PCP. Not currently on therapy.      Relevant Medications   furosemide (LASIX) 20 MG tablet   lisinopril (PRINIVIL,ZESTRIL) 5 MG tablet   Other Relevant Orders   EKG 12-Lead (Completed)   On amiodarone therapy (Chronic)    Thyroid levels being followed by Dr. Caryl Comes. Baseline PFTs obtained in March. Will recheck next March if she remains on amiodarone      Relevant Orders   EKG 12-Lead (Completed)   PAF (paroxysmal atrial fibrillation) - Primary (Chronic)    Maintaining sinus rhythm on amiodarone. -- Per Dr. Caryl Comes, they had a long discussion about rate versus rhythm control. Clearly with her diastolic dysfunction component as well as low EF, she will not tolerate even rate controlled A. fib. I think rhythm control as her only real option.  I will defer to Dr. Caryl Comes as whether or not he would consider Tikosyn or  sotalol over amiodarone. Anticoagulated with warfarin. Currently supratherapeutic - dose titrated by Coumadin clinic RN/pharmacist      Relevant Medications   furosemide (LASIX) 20 MG tablet   lisinopril (PRINIVIL,ZESTRIL)  5 MG tablet   Other Relevant Orders   EKG 12-Lead (Completed)   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, status post MVR (Chronic)    Well-seated valve ring but moderate MR. Hopefully with improved LVEF volume and potentially EF with better afterload reduction, her MR will improve.  Plan: Recheck echo in December. Continue to titrate ACE inhibitor for afterload reduction      Relevant Medications   furosemide (LASIX) 20 MG tablet   lisinopril (PRINIVIL,ZESTRIL) 5 MG tablet   Other Relevant Orders   EKG 12-Lead (Completed)   Echocardiogram      Current medicines are reviewed at length with the patient today. (+/- concerns) "do I need to take so many medications?" The following changes have been made:   NCREASE your lisinopril to 5mg  once per day. Plan will be to titrate further after blood pressure check  Take morning dose of coreg after you eat breakfast  START lasix 20mg  once daily AS NEEDED for shortness of breath  Blood pressure check with INR check next week.  Follow-up in 6 months. Following Echocardiogram in December.   Leonie Man, M.D., M.S. Interventional Cardiologist   Pager # [phone]

## 2015-03-04 NOTE — Assessment & Plan Note (Signed)
Modest improvement on follow-up echocardiogram this past May. Admittedly she was not on target doses of any medications.  Plan per Dr. Caryl Comes would be to continue to titrate ACE inhibitor plus or minus beta blocker that he recently added and reassess EF after giving more time for recovery. Plan: Recheck echo in roughly December timeframe. She is indicated that she probably would prefer not to consider ICD placement at this time.

## 2015-03-04 NOTE — Assessment & Plan Note (Signed)
Labs monitored by PCP. Not currently on therapy.

## 2015-03-09 ENCOUNTER — Encounter: Payer: Self-pay | Admitting: *Deleted

## 2015-03-10 ENCOUNTER — Telehealth: Payer: Self-pay

## 2015-03-10 ENCOUNTER — Ambulatory Visit (INDEPENDENT_AMBULATORY_CARE_PROVIDER_SITE_OTHER): Payer: Medicare Other | Admitting: Cardiology

## 2015-03-10 ENCOUNTER — Other Ambulatory Visit (INDEPENDENT_AMBULATORY_CARE_PROVIDER_SITE_OTHER): Payer: Medicare Other | Admitting: *Deleted

## 2015-03-10 ENCOUNTER — Ambulatory Visit (INDEPENDENT_AMBULATORY_CARE_PROVIDER_SITE_OTHER): Payer: Medicare Other

## 2015-03-10 VITALS — BP 120/58 | Resp 18

## 2015-03-10 DIAGNOSIS — I480 Paroxysmal atrial fibrillation: Secondary | ICD-10-CM

## 2015-03-10 DIAGNOSIS — I4891 Unspecified atrial fibrillation: Secondary | ICD-10-CM

## 2015-03-10 DIAGNOSIS — Z7901 Long term (current) use of anticoagulants: Secondary | ICD-10-CM

## 2015-03-10 DIAGNOSIS — E039 Hypothyroidism, unspecified: Secondary | ICD-10-CM

## 2015-03-10 DIAGNOSIS — I4892 Unspecified atrial flutter: Secondary | ICD-10-CM | POA: Diagnosis not present

## 2015-03-10 DIAGNOSIS — Z5181 Encounter for therapeutic drug level monitoring: Secondary | ICD-10-CM | POA: Diagnosis not present

## 2015-03-10 DIAGNOSIS — I43 Cardiomyopathy in diseases classified elsewhere: Secondary | ICD-10-CM

## 2015-03-10 DIAGNOSIS — I5042 Chronic combined systolic (congestive) and diastolic (congestive) heart failure: Secondary | ICD-10-CM

## 2015-03-10 DIAGNOSIS — R000 Tachycardia, unspecified: Secondary | ICD-10-CM

## 2015-03-10 LAB — POCT INR: INR: 2.1

## 2015-03-10 NOTE — Telephone Encounter (Signed)
Somehow, I don't see a note ??  [place]

## 2015-03-10 NOTE — Progress Notes (Signed)
    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   With current BP - I think we can try to titrate Lisinopril to 7.25 mg qhs.   Leonie Man, MD

## 2015-03-10 NOTE — Telephone Encounter (Signed)
S/w pt regarding Dr. Allison Quarry recommendations to increase lisinopril to 7.5mg  once a day. Pt verbalized understanding with no further questions.

## 2015-03-10 NOTE — Telephone Encounter (Signed)
Forward nurse visit note to [facility] for review

## 2015-03-10 NOTE — Patient Instructions (Signed)
1.) Reason for visit: BP check  2.) Name of MD requesting visit: [place]  3.) H&P: CHF  4.) ROS related to problem: Elevated BP. Lisinopril 5mg  daily added at 7/6 OV with Dr. Ellyn Hack.  5.) Assessment and plan per MD: Will forward to MD  03/04/15 OV notes:  Gradually titrate lisinopril: Increase now to 5 mg daily at bedtime, blood pressure rechecked next week for INR recheck.   Provided she isn't not having signs of orthostatic hypotension or dizziness, and systolic blood pressure greater than 130, will increase to 7.5 mg daily at bedtime with plans to further increase to 10 mg goal.

## 2015-03-12 LAB — TSH: TSH: 5.57 u[IU]/mL — ABNORMAL HIGH (ref 0.450–4.500)

## 2015-03-19 ENCOUNTER — Telehealth: Payer: Self-pay

## 2015-03-19 ENCOUNTER — Other Ambulatory Visit: Payer: Self-pay

## 2015-03-19 ENCOUNTER — Telehealth: Payer: Self-pay | Admitting: Family Medicine

## 2015-03-19 MED ORDER — LEVOTHYROXINE SODIUM 25 MCG PO TABS: 62.5000 ug | ORAL_TABLET | Freq: Every day | ORAL | Status: DC

## 2015-03-19 NOTE — Telephone Encounter (Signed)
Called patient to notify her of being due for a Mammogram. Patient stated that she would like to schedule her appointment with [facility]. I gave the patient their contact information, and patient stated that she would call and have that scheduled.

## 2015-03-24 ENCOUNTER — Other Ambulatory Visit: Payer: Self-pay | Admitting: Family Medicine

## 2015-03-24 DIAGNOSIS — E039 Hypothyroidism, unspecified: Secondary | ICD-10-CM

## 2015-03-25 ENCOUNTER — Ambulatory Visit (INDEPENDENT_AMBULATORY_CARE_PROVIDER_SITE_OTHER): Payer: Medicare Other

## 2015-03-25 DIAGNOSIS — I480 Paroxysmal atrial fibrillation: Secondary | ICD-10-CM

## 2015-03-25 DIAGNOSIS — Z5181 Encounter for therapeutic drug level monitoring: Secondary | ICD-10-CM | POA: Diagnosis not present

## 2015-03-25 DIAGNOSIS — I4892 Unspecified atrial flutter: Secondary | ICD-10-CM

## 2015-03-25 DIAGNOSIS — I4891 Unspecified atrial fibrillation: Secondary | ICD-10-CM

## 2015-03-25 DIAGNOSIS — Z7901 Long term (current) use of anticoagulants: Secondary | ICD-10-CM

## 2015-03-25 LAB — POCT INR: INR: 6

## 2015-03-25 MED ORDER — WARFARIN SODIUM 5 MG PO TABS: ORAL_TABLET | ORAL | Status: DC

## 2015-03-30 ENCOUNTER — Other Ambulatory Visit: Payer: Medicare Other

## 2015-04-01 ENCOUNTER — Ambulatory Visit (INDEPENDENT_AMBULATORY_CARE_PROVIDER_SITE_OTHER): Payer: Medicare Other | Admitting: *Deleted

## 2015-04-01 DIAGNOSIS — Z7901 Long term (current) use of anticoagulants: Secondary | ICD-10-CM | POA: Diagnosis not present

## 2015-04-01 DIAGNOSIS — I4892 Unspecified atrial flutter: Secondary | ICD-10-CM

## 2015-04-01 DIAGNOSIS — I4891 Unspecified atrial fibrillation: Secondary | ICD-10-CM

## 2015-04-01 DIAGNOSIS — I480 Paroxysmal atrial fibrillation: Secondary | ICD-10-CM

## 2015-04-01 DIAGNOSIS — Z5181 Encounter for therapeutic drug level monitoring: Secondary | ICD-10-CM

## 2015-04-01 LAB — POCT INR: INR: 3

## 2015-04-08 ENCOUNTER — Ambulatory Visit (INDEPENDENT_AMBULATORY_CARE_PROVIDER_SITE_OTHER): Payer: Medicare Other | Admitting: *Deleted

## 2015-04-08 DIAGNOSIS — I4891 Unspecified atrial fibrillation: Secondary | ICD-10-CM | POA: Diagnosis not present

## 2015-04-08 DIAGNOSIS — Z7901 Long term (current) use of anticoagulants: Secondary | ICD-10-CM

## 2015-04-08 DIAGNOSIS — I4892 Unspecified atrial flutter: Secondary | ICD-10-CM | POA: Diagnosis not present

## 2015-04-08 DIAGNOSIS — Z5181 Encounter for therapeutic drug level monitoring: Secondary | ICD-10-CM | POA: Diagnosis not present

## 2015-04-08 DIAGNOSIS — I480 Paroxysmal atrial fibrillation: Secondary | ICD-10-CM

## 2015-04-08 LAB — POCT INR: INR: 5.9

## 2015-04-09 ENCOUNTER — Other Ambulatory Visit: Payer: Self-pay | Admitting: Family Medicine

## 2015-04-09 ENCOUNTER — Other Ambulatory Visit (HOSPITAL_COMMUNITY): Payer: Self-pay | Admitting: Adult Health

## 2015-04-09 DIAGNOSIS — Z1231 Encounter for screening mammogram for malignant neoplasm of breast: Secondary | ICD-10-CM

## 2015-04-14 ENCOUNTER — Other Ambulatory Visit: Payer: Self-pay | Admitting: Family Medicine

## 2015-04-14 ENCOUNTER — Ambulatory Visit
Admission: RE | Admit: 2015-04-14 | Discharge: 2015-04-14 | Disposition: A | Discharge status: Home / Self Care | Payer: Medicare Other | Source: Ambulatory Visit | Attending: Family Medicine | Admitting: Family Medicine

## 2015-04-14 DIAGNOSIS — Z1231 Encounter for screening mammogram for malignant neoplasm of breast: Secondary | ICD-10-CM | POA: Insufficient documentation

## 2015-04-15 ENCOUNTER — Ambulatory Visit (INDEPENDENT_AMBULATORY_CARE_PROVIDER_SITE_OTHER): Payer: Medicare Other

## 2015-04-15 DIAGNOSIS — I4891 Unspecified atrial fibrillation: Secondary | ICD-10-CM | POA: Diagnosis not present

## 2015-04-15 DIAGNOSIS — Z5181 Encounter for therapeutic drug level monitoring: Secondary | ICD-10-CM | POA: Diagnosis not present

## 2015-04-15 DIAGNOSIS — Z7901 Long term (current) use of anticoagulants: Secondary | ICD-10-CM | POA: Diagnosis not present

## 2015-04-15 DIAGNOSIS — I480 Paroxysmal atrial fibrillation: Secondary | ICD-10-CM

## 2015-04-15 DIAGNOSIS — I4892 Unspecified atrial flutter: Secondary | ICD-10-CM

## 2015-04-15 LAB — POCT INR: INR: 2.2

## 2015-04-29 ENCOUNTER — Inpatient Hospital Stay (HOSPITAL_COMMUNITY): Payer: Medicare Other

## 2015-04-29 ENCOUNTER — Inpatient Hospital Stay (HOSPITAL_COMMUNITY)
Admission: EM | Admit: 2015-04-29 | Discharge: 2015-04-30 | DRG: 536 | Disposition: A | Discharge status: Home Health | Payer: Medicare Other | Attending: Family Medicine | Admitting: Family Medicine

## 2015-04-29 ENCOUNTER — Emergency Department (HOSPITAL_COMMUNITY): Payer: Medicare Other

## 2015-04-29 ENCOUNTER — Encounter (HOSPITAL_COMMUNITY): Payer: Self-pay | Admitting: Neurology

## 2015-04-29 ENCOUNTER — Ambulatory Visit (INDEPENDENT_AMBULATORY_CARE_PROVIDER_SITE_OTHER): Payer: Medicare Other

## 2015-04-29 DIAGNOSIS — I5042 Chronic combined systolic (congestive) and diastolic (congestive) heart failure: Secondary | ICD-10-CM | POA: Diagnosis present

## 2015-04-29 DIAGNOSIS — F329 Major depressive disorder, single episode, unspecified: Secondary | ICD-10-CM | POA: Diagnosis present

## 2015-04-29 DIAGNOSIS — F419 Anxiety disorder, unspecified: Secondary | ICD-10-CM | POA: Diagnosis present

## 2015-04-29 DIAGNOSIS — Z79899 Other long term (current) drug therapy: Secondary | ICD-10-CM

## 2015-04-29 DIAGNOSIS — R791 Abnormal coagulation profile: Secondary | ICD-10-CM | POA: Diagnosis not present

## 2015-04-29 DIAGNOSIS — S32602A Unspecified fracture of left ischium, initial encounter for closed fracture: Secondary | ICD-10-CM | POA: Diagnosis not present

## 2015-04-29 DIAGNOSIS — Z9861 Coronary angioplasty status: Secondary | ICD-10-CM | POA: Diagnosis not present

## 2015-04-29 DIAGNOSIS — I4892 Unspecified atrial flutter: Secondary | ICD-10-CM

## 2015-04-29 DIAGNOSIS — Z5181 Encounter for therapeutic drug level monitoring: Secondary | ICD-10-CM

## 2015-04-29 DIAGNOSIS — W109XXA Fall (on) (from) unspecified stairs and steps, initial encounter: Secondary | ICD-10-CM | POA: Diagnosis present

## 2015-04-29 DIAGNOSIS — E785 Hyperlipidemia, unspecified: Secondary | ICD-10-CM | POA: Diagnosis present

## 2015-04-29 DIAGNOSIS — Z8673 Personal history of transient ischemic attack (TIA), and cerebral infarction without residual deficits: Secondary | ICD-10-CM

## 2015-04-29 DIAGNOSIS — S329XXA Fracture of unspecified parts of lumbosacral spine and pelvis, initial encounter for closed fracture: Secondary | ICD-10-CM | POA: Diagnosis not present

## 2015-04-29 DIAGNOSIS — M810 Age-related osteoporosis without current pathological fracture: Secondary | ICD-10-CM | POA: Diagnosis present

## 2015-04-29 DIAGNOSIS — Z952 Presence of prosthetic heart valve: Secondary | ICD-10-CM

## 2015-04-29 DIAGNOSIS — I428 Other cardiomyopathies: Secondary | ICD-10-CM | POA: Diagnosis present

## 2015-04-29 DIAGNOSIS — I420 Dilated cardiomyopathy: Secondary | ICD-10-CM | POA: Diagnosis present

## 2015-04-29 DIAGNOSIS — E039 Hypothyroidism, unspecified: Secondary | ICD-10-CM | POA: Diagnosis present

## 2015-04-29 DIAGNOSIS — I058 Other rheumatic mitral valve diseases: Secondary | ICD-10-CM | POA: Diagnosis present

## 2015-04-29 DIAGNOSIS — Z7901 Long term (current) use of anticoagulants: Secondary | ICD-10-CM

## 2015-04-29 DIAGNOSIS — I43 Cardiomyopathy in diseases classified elsewhere: Secondary | ICD-10-CM

## 2015-04-29 DIAGNOSIS — S32592A Other specified fracture of left pubis, initial encounter for closed fracture: Secondary | ICD-10-CM | POA: Diagnosis present

## 2015-04-29 DIAGNOSIS — I059 Rheumatic mitral valve disease, unspecified: Secondary | ICD-10-CM

## 2015-04-29 DIAGNOSIS — R9431 Abnormal electrocardiogram [ECG] [EKG]: Secondary | ICD-10-CM

## 2015-04-29 DIAGNOSIS — R000 Tachycardia, unspecified: Secondary | ICD-10-CM

## 2015-04-29 DIAGNOSIS — I480 Paroxysmal atrial fibrillation: Secondary | ICD-10-CM | POA: Diagnosis present

## 2015-04-29 DIAGNOSIS — I4891 Unspecified atrial fibrillation: Secondary | ICD-10-CM

## 2015-04-29 DIAGNOSIS — M8580 Other specified disorders of bone density and structure, unspecified site: Secondary | ICD-10-CM | POA: Diagnosis present

## 2015-04-29 DIAGNOSIS — S329XXS Fracture of unspecified parts of lumbosacral spine and pelvis, sequela: Secondary | ICD-10-CM | POA: Diagnosis not present

## 2015-04-29 DIAGNOSIS — W19XXXA Unspecified fall, initial encounter: Secondary | ICD-10-CM | POA: Diagnosis present

## 2015-04-29 DIAGNOSIS — M25552 Pain in left hip: Secondary | ICD-10-CM

## 2015-04-29 LAB — URINALYSIS, ROUTINE W REFLEX MICROSCOPIC
BILIRUBIN URINE: NEGATIVE
Glucose, UA: NEGATIVE mg/dL
HGB URINE DIPSTICK: NEGATIVE
Ketones, ur: NEGATIVE mg/dL
Leukocytes, UA: NEGATIVE
NITRITE: NEGATIVE
PROTEIN: NEGATIVE mg/dL
UROBILINOGEN UA: 0.2 mg/dL (ref 0.0–1.0)
pH: 6 (ref 5.0–8.0)

## 2015-04-29 LAB — SAMPLE TO BLOOD BANK

## 2015-04-29 LAB — CBC WITH DIFFERENTIAL/PLATELET
BASOS ABS: 0.1 10*3/uL (ref 0.0–0.1)
BASOS PCT: 1 % (ref 0–1)
EOS ABS: 0.2 10*3/uL (ref 0.0–0.7)
EOS PCT: 2 % (ref 0–5)
HCT: 38.7 % (ref 36.0–46.0)
Hemoglobin: 12.8 g/dL (ref 12.0–15.0)
Lymphocytes Relative: 22 % (ref 12–46)
Lymphs Abs: 1.6 10*3/uL (ref 0.7–4.0)
MCH: 33.3 pg (ref 26.0–34.0)
MCHC: 33.1 g/dL (ref 30.0–36.0)
MCV: 100.8 fL — ABNORMAL HIGH (ref 78.0–100.0)
MONO ABS: 0.4 10*3/uL (ref 0.1–1.0)
Monocytes Relative: 5 % (ref 3–12)
NEUTROS ABS: 4.9 10*3/uL (ref 1.7–7.7)
Neutrophils Relative %: 70 % (ref 43–77)
PLATELETS: 212 10*3/uL (ref 150–400)
RBC: 3.84 MIL/uL — ABNORMAL LOW (ref 3.87–5.11)
RDW: 13 % (ref 11.5–15.5)
WBC: 7.1 10*3/uL (ref 4.0–10.5)

## 2015-04-29 LAB — POCT INR: INR: 5

## 2015-04-29 LAB — BASIC METABOLIC PANEL
ANION GAP: 8 (ref 5–15)
BUN: 12 mg/dL (ref 6–20)
CALCIUM: 9.1 mg/dL (ref 8.9–10.3)
CO2: 25 mmol/L (ref 22–32)
CREATININE: 1.01 mg/dL — AB (ref 0.44–1.00)
Chloride: 106 mmol/L (ref 101–111)
GFR, EST NON AFRICAN AMERICAN: 55 mL/min — AB (ref >60)
Glucose, Bld: 117 mg/dL — ABNORMAL HIGH (ref 65–99)
Potassium: 4.5 mmol/L (ref 3.5–5.1)
SODIUM: 139 mmol/L (ref 135–145)

## 2015-04-29 LAB — PROTIME-INR
INR: 3.95 — ABNORMAL HIGH (ref 0.00–1.49)
PROTHROMBIN TIME: 37.6 s — AB (ref 11.6–15.2)

## 2015-04-29 LAB — TYPE AND SCREEN
ABO/RH(D): O NEG
Antibody Screen: NEGATIVE

## 2015-04-29 LAB — ABO/RH: ABO/RH(D): O NEG

## 2015-04-29 MED ORDER — HYDROMORPHONE HCL 1 MG/ML IJ SOLN
1.0000 mg | Freq: Once | INTRAMUSCULAR | Status: AC
Start: 2015-04-29 — End: 2015-04-29
  Administered 2015-04-29: 1 mg via INTRAVENOUS
  Filled 2015-04-29: qty 1

## 2015-04-29 MED ORDER — AMIODARONE HCL 100 MG PO TABS
200.0000 mg | ORAL_TABLET | Freq: Every day | ORAL | Status: DC
  Filled 2015-04-29: qty 2

## 2015-04-29 MED ORDER — LEVOTHYROXINE SODIUM 125 MCG PO TABS
62.5000 ug | ORAL_TABLET | Freq: Every day | ORAL | Status: DC
  Administered 2015-04-30: 62.5 ug via ORAL
  Filled 2015-04-29: qty 1

## 2015-04-29 MED ORDER — HYDROCODONE-ACETAMINOPHEN 5-325 MG PO TABS
1.0000 | ORAL_TABLET | Freq: Four times a day (QID) | ORAL | Status: DC | PRN
  Administered 2015-04-29 – 2015-04-30 (×3): 2 via ORAL
  Filled 2015-04-29 (×3): qty 2

## 2015-04-29 MED ORDER — CARVEDILOL 3.125 MG PO TABS
3.1250 mg | ORAL_TABLET | Freq: Two times a day (BID) | ORAL | Status: DC
  Administered 2015-04-29 – 2015-04-30 (×2): 3.125 mg via ORAL
  Filled 2015-04-29 (×3): qty 1

## 2015-04-29 MED ORDER — HYDROMORPHONE HCL 1 MG/ML IJ SOLN
1.0000 mg | Freq: Once | INTRAMUSCULAR | Status: AC
  Administered 2015-04-29: 1 mg via INTRAVENOUS
  Filled 2015-04-29: qty 1

## 2015-04-29 MED ORDER — SODIUM CHLORIDE 0.9 % IV BOLUS (SEPSIS)
1000.0000 mL | Freq: Once | INTRAVENOUS | Status: AC
  Administered 2015-04-29: 1000 mL via INTRAVENOUS

## 2015-04-29 MED ORDER — MORPHINE SULFATE (PF) 4 MG/ML IV SOLN
4.0000 mg | Freq: Once | INTRAVENOUS | Status: AC
  Administered 2015-04-29: 4 mg via INTRAVENOUS
  Filled 2015-04-29: qty 1

## 2015-04-29 MED ORDER — LISINOPRIL 5 MG PO TABS
5.0000 mg | ORAL_TABLET | Freq: Every day | ORAL | Status: DC
  Filled 2015-04-29: qty 1

## 2015-04-29 MED ORDER — ONDANSETRON HCL 4 MG/2ML IJ SOLN
4.0000 mg | Freq: Once | INTRAMUSCULAR | Status: AC
  Administered 2015-04-29: 4 mg via INTRAVENOUS
  Filled 2015-04-29: qty 2

## 2015-04-29 MED ORDER — HYDROMORPHONE HCL 1 MG/ML IJ SOLN
0.5000 mg | INTRAMUSCULAR | Status: DC | PRN
  Administered 2015-04-29 – 2015-04-30 (×3): 0.5 mg via INTRAVENOUS
  Filled 2015-04-29 (×3): qty 1

## 2015-04-29 NOTE — ED Notes (Signed)
Patient transported to X-ray 

## 2015-04-29 NOTE — ED Notes (Signed)
Pt is in CT

## 2015-04-29 NOTE — ED Notes (Signed)
Pt fell today while walking down the steps, missed the bottom step and landed on the concrete with her left hip. No LOC, did not hit her head. Pt in a lot of pain, c/o left hip pain.

## 2015-04-29 NOTE — ED Provider Notes (Signed)
CSN: [id number]     Arrival date & time 04/29/15  1155 History   First MD Initiated Contact with Patient 04/29/15 1211     Chief Complaint  Patient presents with  . Fall     (Consider location/radiation/quality/duration/timing/severity/associated sxs/prior Treatment) HPI Comments: Patient is a 69 year old female with a past medical history of atrial fibrillation, CHF, and dilated cardiomyopathy who presents after a fall that occurred prior to arrival. Patient was walking down some stair to look at a pool when she tripped and landed on her left hip. She reports immediate onset of severe, throbbing pain in her left hip without radiation. Movement and palpation makes the pain worse. No alleviating factors. She denies any other injury. She denies head trauma or LOC. Patient's husband was with her and witnessed the fall. Patient takes coumadin for afib.    Past Medical History  Diagnosis Date  . H/O: rheumatic fever     Childhood  . Rheumatic mitral and aortic valve insufficiency 2005    a) s/p MV Ring repair; with Cox-Maze for Afib; b) Echo 2013: EF 60-65%,no Regional WMA, Gr 2 DD, no Sig MR ;; c) TEE 10/07/2014: Severlely thickened and calcified MV leaflets. Posterior MV leaflet is fixed and immobile. MAC. reduced excursion of the anterior MV leaflet. Mean MV gradient was 73mm Hg and MVA calculated at cm2.    Marland Kitchen CVA (cerebral infarction)     h/o superior cerebellar infarct  . Paroxysmal atrial fibrillation 2005; Recurrent 09/2014    a) 2005: s/p Cox Maze & LAA ligation; b) 09/2014: TEE-cardioversion  . Atrial fibrillation and flutter 09/2014    Revereted from Afib RVR to 2:1 A Flutter -- TEE/ DCCV 2/9; on Amiodarone  . 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 Digoxin toxicity 09/2014  . Acne rosacea   . Diverticulosis   . Osteopenia   . Anxiety   . Depression   . Urinary incontinence   . Asthma   . Hypothyroid     On replacement therapy; normal TSH 09/2014  . Migraine headache   . HYPERLIPIDEMIA 04/05/2010    Qualifier: Diagnosis of  By: Lorelei Pont MD, Frederico Hamman     Past Surgical History  Procedure Laterality Date  . Mitral valve repair  2005    with [facility] Maze for [employer]  . Cardiac catheterization  Jan 2005    Pre-op R&LHC -- Nonobstructive CAD; normal PA pressures  . Cardioversion  06/04/2012    Procedure: CARDIOVERSION;  Surgeon: Carlena Bjornstad, MD;  Location: [facility] ENDOSCOPY;  Service: Cardiovascular;  Laterality: N/A;  . Right heart catheterization N/A 10/03/2014    Procedure: RIGHT HEART CATH;  Surgeon: Jolaine Artist, MD;  Location: [facility] CATH LAB;  Service: Cardiovascular;  RAP 67mmHg, RVP 35/2/9 mmHg, PAP 45/12 mmHg, PCWP 16 mmHg; CO/I by Fick: 3.9/2.3; Ao/PA/SVC SaO2%: 97%/63%/65%.  Darden Dates without cardioversion N/A 10/07/2014    Procedure: TRANSESOPHAGEAL ECHOCARDIOGRAM (TEE);  Surgeon: Tressia Miners  Remonia Richter, MD;  Location: [place];  Service: Cardiovascular;  Laterality: N/A;  . Cardioversion N/A 10/07/2014    Procedure: CARDIOVERSION;  Surgeon: Sueanne Margarita, MD;  Location: [facility] ENDOSCOPY;  Service: Cardiovascular;  Laterality: N/A;  . Transthoracic echocardiogram  2013; 2/'16 & 5/'16    a) 2013: EF 60-65%, mild MR, Gr 2 DD; b) 2/'16 @ [place]: EF ~10% - severel global LV dysfunction (systolic & diastolic) - dilated LV & restrictive filling pattern - Gr 3 DD, mod reduced RV function, severely dilated LA - 6.4 cm, mod dilated RA, mod pl effusion, mod MR, mild TR; c) 5/10/'16:  EF 25-30%, mod  LV dilation, no RWMA,Gr 3 DD w/ high LAP, MV sewing ring intact w/ Mod MR, Severe LA dilation  . Lumbar disc surgery      x 2 distantly  . Thyroidectomy, partial      partial-no cancer; now on thyroid replacement  . Abdominal hysterectomy  1980s    (no cancer),ovaries present  . Nm myoview ltd  4/7/'16    EF 37% with septal hypokinesis and diffuse hypokinesis. No ischemia or infarction. 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   Family History  Problem Relation Age of Onset  . Diabetes Mother   . Coronary artery disease Mother   . Kidney disease Mother   . Breast cancer Neg Hx    Social History  Substance Use Topics  . Smoking status: Never Smoker   . Smokeless tobacco: Never Used  . Alcohol Use: No   OB History    No data available     Review of Systems  Musculoskeletal: Positive for arthralgias.  All other systems reviewed and are negative.     Allergies  Review of patient's allergies indicates no known allergies.  Home Medications   Prior to Admission medications   Medication Sig Start Date End Date Taking? Authorizing Provider  amiodarone (PACERONE) 200 MG tablet Take 1 tablet (200 mg total) by mouth daily. 12/26/14   Leonie Man, MD  carvedilol (COREG) 3.125 MG tablet Take 1 tablet (3.125 mg total) by mouth 2 (two) times daily. 01/27/15   [name], MD  furosemide (LASIX) 20 MG tablet Take 1 tablet (20 mg total) by mouth daily. Daily as needed for shortness of breath 03/04/15   Leonie Man, MD  levothyroxine (LEVOTHROID) 25 MCG tablet Take 2.5 tablets (62.5 mcg total) by mouth daily before breakfast. 03/19/15   [name], MD  lisinopril (PRINIVIL,ZESTRIL) 5 MG tablet Take 1 tablet (5 mg total) by mouth daily. 03/04/15   Leonie Man, MD  potassium chloride SA (K-DUR,KLOR-CON) 20 MEQ tablet Take 1 tablet (20 mEq total) by mouth 2 (two) times daily. 01/27/15   [name], MD  warfarin  (COUMADIN) 5 MG tablet Take as directed by Coumadin clinic 03/25/15   Minus Breeding, MD   BP 125/84 mmHg  Pulse 62  Temp(Src) 97.6 F (36.4 C) (Oral)  Resp 16  SpO2 100% Physical Exam  Constitutional: She is oriented to person, place, and time. She appears well-developed and well-nourished.  Patient obviously uncomfortable and tearful.   HENT:  Head: Normocephalic and atraumatic.  Eyes: Conjunctivae and EOM are normal.  Neck: Normal range of motion.  Cardiovascular: Normal rate and regular rhythm.  Exam reveals no gallop and no friction rub.   No murmur heard. Pulmonary/Chest: Effort normal and breath sounds normal. She has no wheezes. She has no rales.  She exhibits no tenderness.  Abdominal: Soft. She exhibits no distension. There is no tenderness. There is no rebound and no guarding.  Musculoskeletal:  Patient holding the left hip and knee in a flexed position and not willing to move it due to severe pain. Left lateral and inguinal tenderness to palpation. No obvious deformity.   Neurological: She is alert and oriented to person, place, and time. Coordination normal.  Speech is goal-oriented. Moves limbs without ataxia.   Skin: Skin is warm and dry.  Psychiatric: She has a normal mood and affect. Her behavior is normal.  Nursing note and vitals reviewed.   ED Course  Procedures (including critical care time) Labs Review Labs Reviewed  CBC WITH DIFFERENTIAL/PLATELET - Abnormal; Notable for the following:    RBC 3.84 (*)    MCV 100.8 (*)    All other components within normal limits  BASIC METABOLIC PANEL - Abnormal; Notable for the following:    Glucose, Bld 117 (*)    Creatinine, Ser 1.01 (*)    GFR calc non Af Amer 55 (*)    All other components within normal limits  PROTIME-INR - Abnormal; Notable for the following:    Prothrombin Time 37.6 (*)    INR 3.95 (*)    All other components within normal limits  URINALYSIS, ROUTINE W REFLEX MICROSCOPIC (NOT AT [facility]) -  Abnormal; Notable for the following:    Specific Gravity, Urine >1.030 (*)    All other components within normal limits  SAMPLE TO BLOOD BANK  TYPE AND SCREEN  ABO/RH    Imaging Review Dg Chest 1 View  04/29/2015   CLINICAL DATA:  Status post fall with left hip pain.  EXAM: CHEST  1 VIEW  COMPARISON:  10/03/2014  FINDINGS: Median sternotomy wires are intact.  Cardiomediastinal silhouette is normal. Mediastinal contours appear intact. The aorta is torturous.  There is no evidence of focal airspace consolidation, pleural effusion or pneumothorax.  Osseous structures are without acute abnormality. There is a linear radiodense structure seen overlying the right hemithorax with uncertain significance.  IMPRESSION: No radiographic evidence of acute cardiopulmonary abnormality.  Linear radiodense structure overlying the right hemithorax with uncertain significance. It may be external to the body. Clinical correlation is advised.   Electronically Signed   By: Fidela Salisbury M.D.   On: 04/29/2015 13:28   Dg Hip Unilat With Pelvis 2-3 Views Left  04/29/2015   CLINICAL DATA:  Pain following fall  EXAM: DG HIP (WITH OR WITHOUT PELVIS) 2-3V LEFT  COMPARISON:  None.  FINDINGS: Frontal pelvis as well as frontal and lateral left hip images were obtained. There is a fracture of the mid left ischium. There is an apparent incomplete fracture along the lateral left superior pubic ramus. No other fractures are apparent. No dislocation. Joint spaces appear intact. There is a small bone island in the lateral left iliac crest.  IMPRESSION: Transversely oriented fracture mid left ischium. Incomplete fracture lateral left superior pubic ramus. Alignment in these areas is essentially anatomic. No other fractures. No dislocation.   Electronically Signed   By: Lowella Grip III M.D.   On: 04/29/2015 13:27   I have personally reviewed and evaluated these images and lab results as part of my medical decision-making.    EKG Interpretation   Date/Time:  Wednesday April 29 2015 12:15:33 EDT Ventricular Rate:  63 PR Interval:  204 QRS Duration: 112 QT Interval:  524 QTC Calculation: 536 R Axis:   94 Text Interpretation:  Sinus or ectopic atrial rhythm Borderline  intraventricular conduction delay Nonspecific repol abnormality, diffuse  leads Prolonged QT interval Confirmed by ZAVITZ  MD, JOSHUA ([id number]) on  04/29/2015 1:15:22 PM      MDM   Final diagnoses:  Prolonged Q-T interval on ECG  Fall, initial encounter  Acute hip pain, left    12:13 PM Labs and hip xray pending. Patient will have morphine and zofran for symptoms. Vitals stable and patient afebrile. No other injuries.   Patient admitted to medicine and Orthopedics will see the patient. CT pelvis pending.    Alvina Chou, PA-C 04/29/15 1447  Elnora Morrison, MD 05/01/15 1630

## 2015-04-29 NOTE — ED Notes (Signed)
Patient transported to CT 

## 2015-04-29 NOTE — H&P (Signed)
[facility] History and Physical  [name]:[id number] DOB: 02-Oct-1945 DOA: 04/29/2015  Referring physician: Alvina Chou, PA-C PCP: Owens Loffler, MD   Chief Complaint: Fall and Left-sided pelvic fractures  HPI: [name] is a 69 y.o. 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 Patient was in her usual state of health this morning, she was trying to go down stairs down to the pool and she slipped and landed on her left side. She was unable to get up because of severe pain, patient brought to the hospital for further evaluation. In the ED, x-ray of the hip showed fracture of the left ischium and the left superior pubic ramus. Patient was also found to have supratherapeutic INR of 3.95. Patient admitted to the hospital for further medical and surgical evaluation.  Review of Systems:  Constitutional: negative for anorexia, fevers and sweats Eyes: negative for irritation, redness and visual disturbance Ears, nose, mouth, throat, and face: negative for earaches, epistaxis, nasal congestion and sore throat Respiratory: negative for cough, dyspnea on exertion, sputum and wheezing Cardiovascular: negative for chest pain, dyspnea, lower extremity edema, orthopnea, palpitations and syncope Gastrointestinal: negative for abdominal pain, constipation, diarrhea, melena, nausea and vomiting Genitourinary:negative for dysuria, frequency and hematuria Hematologic/lymphatic: negative for bleeding, easy bruising and lymphadenopathy Musculoskeletal:negative for arthralgias, muscle weakness and stiff joints Neurological: negative for coordination problems, gait problems, headaches and weakness Endocrine: negative for diabetic symptoms including polydipsia, polyuria and weight loss Allergic/Immunologic: negative for anaphylaxis, hay  fever and urticaria  Past Medical History  Diagnosis Date  . H/O: rheumatic fever     Childhood  . Rheumatic mitral and aortic valve insufficiency 2005    a) s/p MV Ring repair; with Cox-Maze for Afib; b) Echo 2013: EF 60-65%,no Regional WMA, Gr 2 DD, no Sig MR ;; c) TEE 10/07/2014: Severlely thickened and calcified MV leaflets. Posterior MV leaflet is fixed and immobile. MAC. reduced excursion of the anterior MV leaflet. Mean MV gradient was 57mm Hg and MVA calculated at cm2.    Marland Kitchen CVA (cerebral infarction)     h/o superior cerebellar infarct  . Paroxysmal atrial fibrillation 2005; Recurrent 09/2014    a) 2005: s/p Cox Maze & LAA ligation; b) 09/2014: TEE-cardioversion  . Atrial fibrillation and flutter 09/2014    Revereted from Afib RVR to 2:1 A Flutter -- TEE/ DCCV 2/9; on Amiodarone  . 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 Digoxin toxicity 09/2014  . Acne rosacea   . Diverticulosis   . Osteopenia   . Anxiety   . Depression   . Urinary incontinence   . Asthma   . Hypothyroid     On replacement therapy; normal TSH 09/2014  . Migraine headache   . HYPERLIPIDEMIA 04/05/2010    Qualifier: Diagnosis of  By: Lorelei Pont MD, Frederico Hamman  Past Surgical History  Procedure Laterality Date  . Mitral valve repair  2005    with [facility] Maze for [employer]  . Cardiac catheterization  Jan 2005    Pre-op R&LHC -- Nonobstructive CAD; normal PA pressures  . Cardioversion  06/04/2012    Procedure:  CARDIOVERSION;  Surgeon: Carlena Bjornstad, MD;  Location: [facility] ENDOSCOPY;  Service: Cardiovascular;  Laterality: N/A;  . Right heart catheterization N/A 10/03/2014    Procedure: RIGHT HEART CATH;  Surgeon: Jolaine Artist, MD;  Location: [facility] CATH LAB;  Service: Cardiovascular;  RAP 42mmHg, RVP 35/2/9 mmHg, PAP 45/12 mmHg, PCWP 16 mmHg; CO/I by Fick: 3.9/2.3; Ao/PA/SVC SaO2%: 97%/63%/65%.  Darden Dates without cardioversion N/A 10/07/2014    Procedure: TRANSESOPHAGEAL ECHOCARDIOGRAM (TEE);  Surgeon: Sueanne Margarita, MD;  Location: [place];  Service: Cardiovascular;  Laterality: N/A;  . Cardioversion N/A 10/07/2014    Procedure: CARDIOVERSION;  Surgeon: Sueanne Margarita, MD;  Location: [facility] ENDOSCOPY;  Service: Cardiovascular;  Laterality: N/A;  . Transthoracic echocardiogram  2013; 2/'16 & 5/'16    a) 2013: EF 60-65%, mild MR, Gr 2 DD; b) 2/'16 @ [place]: EF ~10% - severel global LV dysfunction (systolic & diastolic) - dilated LV & restrictive filling pattern - Gr 3 DD, mod reduced RV function, severely dilated LA - 6.4 cm, mod dilated RA, mod pl effusion, mod MR, mild TR; c) 5/10/'16:  EF 25-30%, mod LV dilation, no RWMA,Gr 3 DD w/ high LAP, MV sewing ring intact w/ Mod MR, Severe LA dilation  . Lumbar disc surgery      x 2 distantly  . Thyroidectomy, partial      partial-no cancer; now on thyroid replacement  . Abdominal hysterectomy  1980s    (no cancer),ovaries present  . Nm myoview ltd  4/7/'16    EF 37% with septal hypokinesis and diffuse hypokinesis. No ischemia or infarction. 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   Social History:   reports that she has never smoked. She has never used smokeless tobacco. She reports that she does not drink alcohol or use illicit drugs.  No Known Allergies  Family History  Problem Relation Age of Onset  . Diabetes Mother   . Coronary artery disease Mother   . Kidney disease Mother   . Breast cancer Neg Hx      Prior to Admission medications   Medication Sig Start Date End Date Taking? Authorizing Provider  amiodarone (PACERONE) 200 MG tablet Take 1 tablet (200 mg total) by mouth daily. 12/26/14   Leonie Man, MD  carvedilol (COREG) 3.125 MG tablet Take 1 tablet (3.125 mg total) by mouth 2 (two) times daily. 01/27/15   [name], MD  furosemide (LASIX) 20 MG tablet Take 1 tablet (20 mg total) by mouth daily. Daily as needed for shortness of breath 03/04/15   Leonie Man, MD  levothyroxine (LEVOTHROID) 25 MCG tablet Take 2.5 tablets (62.5 mcg total) by mouth daily before breakfast. 03/19/15   [name], MD  lisinopril (PRINIVIL,ZESTRIL) 5 MG tablet Take 1 tablet (5 mg total) by mouth daily. 03/04/15   Leonie Man, MD  potassium chloride SA (K-DUR,KLOR-CON) 20 MEQ tablet Take 1 tablet (20 mEq total) by mouth 2 (two) times daily. 01/27/15   [name], MD  warfarin (COUMADIN) 5 MG tablet Take as directed by Coumadin clinic 03/25/15   Minus Breeding, MD   Physical Exam: Filed Vitals:   04/29/15 1401  BP: 120/53  Pulse: 62  Temp:   Resp: 18   Constitutional: Oriented to person, place, and time. Well-developed and well-nourished. Cooperative.  Head: Normocephalic and atraumatic.  Nose: Nose normal.  Mouth/Throat: Uvula is midline, oropharynx is clear and moist and mucous membranes are normal.  Eyes: Conjunctivae and EOM are normal. Pupils are equal, round, and reactive to light.  Neck: Trachea normal and normal range of motion. Neck supple.  Cardiovascular: Normal rate, regular rhythm, S1 normal, S2 normal, normal heart sounds and intact distal pulses.   Pulmonary/Chest: Effort normal and breath sounds normal.  Abdominal: Soft. Bowel sounds are normal. There is no hepatosplenomegaly. There is no tenderness.  Musculoskeletal: Normal range of motion.  Neurological: Alert and oriented to person, place, and time. Has normal strength. No cranial nerve deficit or sensory deficit.   Skin: Skin is warm, dry and intact.  Psychiatric: Has a normal mood and affect. Speech is normal and behavior is normal.   Labs on Admission:  Basic Metabolic Panel:  Recent Labs Lab 04/29/15 1209  NA 139  K 4.5  CL 106  CO2 25  GLUCOSE 117*  BUN 12  CREATININE 1.01*  CALCIUM 9.1   Liver Function Tests: No results for input(s): AST, ALT, ALKPHOS, BILITOT, PROT, ALBUMIN in the last 168 hours. No results for input(s): LIPASE, AMYLASE in the last 168 hours. No results for input(s): AMMONIA in the last 168 hours. CBC:  Recent Labs Lab 04/29/15 1209  WBC 7.1  NEUTROABS 4.9  HGB 12.8  HCT 38.7  MCV 100.8*  PLT 212   Cardiac Enzymes: No results for input(s): CKTOTAL, CKMB, CKMBINDEX, TROPONINI in the last 168 hours.  BNP (last 3 results)  Recent Labs  10/03/14 1804 10/15/14 1251  BNP 1697.7* 691.1*    ProBNP (last 3 results) No results for input(s): PROBNP in the last 8760 hours.  CBG: No results for input(s): GLUCAP in the last 168 hours.  Radiological Exams on Admission: Dg Chest 1 View  04/29/2015   CLINICAL DATA:  Status post fall with left hip pain.  EXAM: CHEST  1 VIEW  COMPARISON:  10/03/2014  FINDINGS: Median sternotomy wires are intact.  Cardiomediastinal silhouette is normal. Mediastinal contours appear intact. The aorta is torturous.  There is no evidence of focal airspace consolidation, pleural effusion or pneumothorax.  Osseous structures are without acute abnormality. There is a linear radiodense structure seen overlying the right hemithorax with uncertain significance.  IMPRESSION: No radiographic evidence of acute cardiopulmonary abnormality.  Linear radiodense structure overlying the right hemithorax with uncertain significance. It may be external to the body. Clinical correlation is advised.   Electronically Signed   By: Fidela Salisbury M.D.   On: 04/29/2015 13:28   Dg Hip Unilat With Pelvis 2-3 Views Left  04/29/2015   CLINICAL DATA:  Pain  following fall  EXAM: DG HIP (WITH OR WITHOUT PELVIS) 2-3V LEFT  COMPARISON:  None.  FINDINGS: Frontal pelvis as well as frontal and lateral left hip images were obtained. There is a fracture of the mid left ischium. There is an apparent incomplete fracture along the lateral left superior pubic ramus. No other fractures are apparent. No dislocation. Joint spaces appear intact. There is a small bone island in the lateral left iliac crest.  IMPRESSION: Transversely oriented fracture mid left ischium. Incomplete fracture lateral left superior pubic ramus. Alignment in these areas is essentially anatomic. No other fractures. No dislocation.   Electronically Signed   By: Lowella Grip III  M.D.   On: 04/29/2015 13:27    EKG: Independently reviewed. Heart rate is 63 showing sinus or ectopic atrial rhythm.  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 Plain x-rays showed left ischial fracture, incomplete fracture of the left superior pubic ramus. Orthopedic consult they will come and evaluate the patient. CT scan of the pelvis without contrast to be done to rule out any other fractures or extension of it.  Fall Appears to be a mechanical fall, patient fell while she was going down stairs. Denies any prodrome type of symptoms prior to the fall, denies any palpitations or any chest pain. Denies any loss of consciousness, place on telemetry, no further workup.  Chronic combined systolic and diastolic CHF Patient had severe dilated cardiomyopathy with history of LVEF of 50%, last 2-D echo in May of LVEF was  25-30%. Patient denies any recent shortness of breath, denies any orthopnea. Denies any palpitations, denies any dizziness, previous syncopal episodes chest pain. Does not have ICD, Will be on telemetry. Hold Lasix and potassium overnight  Subtherapeutic INR Patient has atrial flutter and bioprosthetic valve. Patient is taking Coumadin, INR is 3.95. Hold Coumadin.  Atrial fibrillation/flutter Rate is controlled with amiodarone. Anticoagulated with Coumadin (INR target is 2-3) . This patients CHA2DS2-VASc Score and unadjusted Ischemic Stroke Rate (% per year) is equal to 4.8 % stroke rate/year from a score of 4 for CHF, HTN, age and being a female. Above score calculated as 1 point each if present [CHF, HTN, DM, Vascular=MI/PAD/Aortic Plaque, Age if 65-74, or Female] Above score calculated as 2 points each if present [Age > 75, or Stroke/TIA/TE]  Rheumatic mitral valve, status post mitral valve repair History of rheumatic mitral valve, repaired in 2005 by Dr. Roxy Manns. Modified Cox maze procedure done in the same time as well.  Code Status: Full code Family Communication: Plan discussed with the patient in the presence of her husband bedside Disposition Plan: Telemetry  Time spent: 70 minutes  Aryn Safran A, MD [facility] Pager [phone]

## 2015-04-29 NOTE — Progress Notes (Addendum)
Patient ID: [name], female   DOB: 07-15-1946, 69 y.o.   MRN: [id number]    Patient ID: [name] MRN: [id number] DOB/AGE: [birth year] 68 y.o.  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  She was admitted with an acute fracture of her left pelvis.  The pt is accompanied by her husband.  She has a history of MVR, CVA, afib  and is managed on coumadin. She reports they have difficulty managing her INR levels.   She reports pain with movement of her left lower extremity especially medially.  She reports her pain is currently well controlled.  She denies a history osteoporosis.  She denies other falls.   Past Medical History: Past Medical History  Diagnosis Date  . H/O: rheumatic fever     Childhood  . Rheumatic mitral and aortic valve insufficiency 2005    a) s/p MV Ring repair; with [facility] for Afib; b) Echo 2013: EF 60-65%,no Regional WMA, Gr 2 DD, no Sig MR ;; c) TEE 10/07/2014: Severlely thickened and calcified MV leaflets. Posterior MV leaflet is fixed and immobile. MAC. reduced excursion of the anterior MV leaflet. Mean MV gradient was 92mm Hg and MVA calculated at cm2.    Marland Kitchen CVA (cerebral infarction)     h/o superior cerebellar infarct  . Paroxysmal atrial fibrillation 2005; Recurrent 09/2014    a) 2005: s/p [facility] & LAA ligation; b) 09/2014: TEE-cardioversion  . Atrial fibrillation and flutter 09/2014    Revereted from Afib RVR to 2:1 A Flutter -- TEE/ DCCV 2/9; on Amiodarone  . 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 Digoxin toxicity 09/2014  . Acne rosacea   . Diverticulosis   . Osteopenia   . Anxiety   . Depression   . Urinary incontinence   . Asthma   . Hypothyroid     On replacement therapy; normal TSH 09/2014  . Migraine headache   . HYPERLIPIDEMIA 04/05/2010    Qualifier: Diagnosis of  By: Lorelei Pont MD, [place]      Surgical History: Past Surgical History  Procedure Laterality Date  . Mitral valve repair  2005    with [facility] for [employer]  . Cardiac catheterization  Jan 2005    Pre-op R&LHC -- Nonobstructive CAD; normal PA pressures  . Cardioversion  06/04/2012    Procedure: CARDIOVERSION;  Surgeon: Carlena Bjornstad, MD;  Location: [facility] ENDOSCOPY;  Service: Cardiovascular;  Laterality: N/A;  . Right heart catheterization N/A 10/03/2014    Procedure: RIGHT HEART CATH;  Surgeon: Jolaine Artist, MD;  Location: [facility]  CATH LAB;  Service: Cardiovascular;  RAP 56mmHg, RVP 35/2/9 mmHg, PAP 45/12 mmHg, PCWP 16 mmHg; CO/I by Fick: 3.9/2.3; Ao/PA/SVC SaO2%: 97%/63%/65%.  Darden Dates without cardioversion N/A 10/07/2014    Procedure: TRANSESOPHAGEAL ECHOCARDIOGRAM (TEE);  Surgeon: Sueanne Margarita, MD;  Location: [place];  Service: Cardiovascular;  Laterality: N/A;  . Cardioversion N/A 10/07/2014    Procedure: CARDIOVERSION;  Surgeon: Sueanne Margarita, MD;  Location: [facility] ENDOSCOPY;  Service: Cardiovascular;  Laterality: N/A;  . Transthoracic echocardiogram  2013; 2/'16 & 5/'16    a) 2013: EF 60-65%, mild MR, Gr 2 DD; b) 2/'16 @ [facility]: EF ~10% - severel global LV dysfunction (systolic & diastolic) - dilated LV &  restrictive filling pattern - Gr 3 DD, mod reduced RV function, severely dilated LA - 6.4 cm, mod dilated RA, mod pl effusion, mod MR, mild TR; c) 5/10/'16:  EF 25-30%, mod LV dilation, no RWMA,Gr 3 DD w/ high LAP, MV sewing ring intact w/ Mod MR, Severe LA dilation  . Lumbar disc surgery      x 2 distantly  . Thyroidectomy, partial      partial-no cancer; now on thyroid replacement  . Abdominal hysterectomy  1980s    (no cancer),ovaries present  . Nm myoview ltd  4/7/'16    EF 37% with septal hypokinesis and diffuse hypokinesis. No ischemia or infarction. 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    Family History: Family History  Problem Relation Age of Onset  . Diabetes Mother   . Coronary artery disease Mother   . Kidney disease Mother   . Breast cancer Neg Hx     Social History: Social History   Social History  . Marital Status: Married    Spouse Name: N/A  . Number of Children: 2  . Years of Education: N/A   Occupational History  . Retired    Social History Main Topics  . Smoking status: Never Smoker   . Smokeless tobacco: Never Used  . Alcohol Use: No  . Drug Use: No  . Sexual Activity: Not on file   Other Topics Concern  . Not on file   Social History Narrative    Allergies: Review of patient's allergies indicates no known allergies.  Medications: I have reviewed the patient's current medications.  Vital Signs: Patient Vitals for the past 24 hrs:  BP Temp Temp src Pulse Resp SpO2  04/29/15 1542 (!) 122/59 mmHg 98.7 F (37.1 C) - 66 16 100 %  04/29/15 1456 120/78 mmHg - - 71 16 100 %  04/29/15 1415 128/58 mmHg - - 61 26 100 %  04/29/15 1401 (!) 120/53 mmHg - - 62 18 100 %  04/29/15 1400 (!) 120/53 mmHg - - 61 15 100 %  04/29/15 1245 114/59 mmHg - - 60 (!) 36 100 %  04/29/15 1230 133/64 mmHg - - (!) 59 (!) 31 100 %  04/29/15 1213 125/84 mmHg 97.6 F (36.4 C) Oral 62 16 100 %     Radiology: Dg Chest 1 View  04/29/2015   CLINICAL DATA:  Status post fall with left hip pain.  EXAM: CHEST  1 VIEW  COMPARISON:  10/03/2014  FINDINGS: Median sternotomy wires are intact.  Cardiomediastinal silhouette is normal. Mediastinal contours appear intact. The aorta is torturous.  There is no evidence of focal airspace consolidation, pleural effusion or pneumothorax.  Osseous structures are without acute abnormality. There is a linear radiodense structure seen overlying the right  hemithorax with uncertain significance.  IMPRESSION: No radiographic evidence of acute cardiopulmonary abnormality.  Linear radiodense structure overlying the right hemithorax with uncertain significance. It may be external to the body. Clinical correlation is advised.   Electronically Signed   By: Fidela Salisbury M.D.   On: 04/29/2015 13:28   Ct Pelvis Wo Contrast  04/29/2015   CLINICAL DATA:  Fall down stairs. Left groin pain radiating to the pelvis. Left pelvic fracture.  EXAM: CT PELVIS WITHOUT CONTRAST  TECHNIQUE: Multidetector CT imaging of the pelvis was performed following the standard protocol without intravenous contrast.  COMPARISON:  04/29/2015; 09/10/2012  FINDINGS: Vertical fracture left sacral ala partially extending into the sacroiliac joint. The transverse component of the fracture observed at the junction of the S4-5 level.  Nondisplaced fracture of the medial portion of the superior pubic ramus, adjacent to the anterior acetabular wall.  Minimally (2 mm) displaced fracture of the midportion of the inferior pubic ramus.  No significant SI joint diastases. There is hematoma anterior to the left sacral fracture, tracking adjacent to the left sacral plexus and towards the left sciatic notch, and displacing the sciatic nerve above the level of the sciatic notch.  Hematoma tracks along the left hip adductor musculature.  Foley catheter in the urinary bladder. Prominent stool throughout the colon favors  constipation. Appendix normal. Uterus absent. No fracture involving the acetabular surface itself is identified.  IMPRESSION: 1. Vertical fracture of the left sacral ala with some extension into the left sacroiliac joint, and with a transverse component of the sacral fracture at the S4-5 level. 2. Transverse fracture of the midportion of the inferior pubic ramus with adjacent hematoma tracking in the left hip adductor musculature. 3. There is a fracture of the medial most margin of the left superior pubic ramus, adjacent to the anterior wall of the left acetabulum. 4. There is some hematoma tracking along the sacral fracture, displacing part of the sacral plexus and the upper portion of the sciatic nerve.   Electronically Signed   By: Van Clines M.D.   On: 04/29/2015 15:23   Mm Screening Breast Tomo Bilateral  04/15/2015   CLINICAL DATA:  Screening.  EXAM: DIGITAL SCREENING BILATERAL MAMMOGRAM WITH 3D TOMO WITH CAD  COMPARISON:  Previous exam(s).  ACR Breast Density Category c: The breast tissue is heterogeneously dense, which may obscure small masses.  FINDINGS: There are no findings suspicious for malignancy. Images were processed with CAD.  IMPRESSION: No mammographic evidence of malignancy. A result letter of this screening mammogram will be mailed directly to the patient.  RECOMMENDATION: Screening mammogram in one year. (Code:SM-B-01Y)  BI-RADS CATEGORY  1: Negative.   Electronically Signed   By: Franki Cabot M.D.   On: 04/15/2015 08:39   Dg Hip Unilat With Pelvis 2-3 Views Left  04/29/2015   CLINICAL DATA:  Pain following fall  EXAM: DG HIP (WITH OR WITHOUT PELVIS) 2-3V LEFT  COMPARISON:  None.  FINDINGS: Frontal pelvis as well as frontal and lateral left hip images were obtained. There is a fracture of the mid left ischium. There is an apparent incomplete fracture along the lateral left superior pubic ramus. No other fractures are apparent. No dislocation. Joint spaces appear intact. There is  a small bone island in the lateral left iliac crest.  IMPRESSION: Transversely oriented fracture mid left ischium. Incomplete fracture lateral left superior pubic ramus. Alignment in these areas is essentially anatomic. No other fractures. No dislocation.   Electronically Signed   By:  Lowella Grip III M.D.   On: 04/29/2015 13:27    Labs:  Recent Labs  04/29/15 1209  WBC 7.1  RBC 3.84*  HCT 38.7  PLT 212    Recent Labs  04/29/15 1209  NA 139  K 4.5  CL 106  CO2 25  BUN 12  CREATININE 1.01*  GLUCOSE 117*  CALCIUM 9.1    Recent Labs  04/29/15 1001 04/29/15 1209  INR 5.0 3.95*    Review of Systems: ROS  Physical Exam:  Neurologically intact ABD soft Sensation intact distally Intact pulses distally Dorsiflexion/Plantar flexion intact No cellulitis present Compartment soft  Assessment and Plan: Pt will remain non weight bearing on her LLE Imaging will be reviewed with Dr. Rolena Infante Plan pending further assessment  Melina Schools, MD [place] [phone]   CT scan reviewed Case discussed with Dr Marcelino Scot No indication for surgical management at this time WBAT with assistive device Pain medication per medical service Will f/u with me in 2 weeks for repeat xrays Also will need DVT prevention - coumadin or ASA. Will defer to Medical team Also need to f/u with PCP concerning osteoporosis management  Will sign off - please call with questions/concerns

## 2015-04-30 DIAGNOSIS — R791 Abnormal coagulation profile: Secondary | ICD-10-CM

## 2015-04-30 DIAGNOSIS — S329XXS Fracture of unspecified parts of lumbosacral spine and pelvis, sequela: Secondary | ICD-10-CM

## 2015-04-30 LAB — BASIC METABOLIC PANEL
ANION GAP: 7 (ref 5–15)
BUN: 11 mg/dL (ref 6–20)
CALCIUM: 8.4 mg/dL — AB (ref 8.9–10.3)
CO2: 25 mmol/L (ref 22–32)
Chloride: 105 mmol/L (ref 101–111)
Creatinine, Ser: 0.92 mg/dL (ref 0.44–1.00)
GFR calc Af Amer: >60 mL/min (ref >60)
Glucose, Bld: 97 mg/dL (ref 65–99)
POTASSIUM: 4 mmol/L (ref 3.5–5.1)
SODIUM: 137 mmol/L (ref 135–145)

## 2015-04-30 LAB — CBC
HEMATOCRIT: 34.8 % — AB (ref 36.0–46.0)
HEMOGLOBIN: 11.7 g/dL — AB (ref 12.0–15.0)
MCH: 33.7 pg (ref 26.0–34.0)
MCHC: 33.6 g/dL (ref 30.0–36.0)
MCV: 100.3 fL — ABNORMAL HIGH (ref 78.0–100.0)
Platelets: 156 10*3/uL (ref 150–400)
RBC: 3.47 MIL/uL — ABNORMAL LOW (ref 3.87–5.11)
RDW: 13.2 % (ref 11.5–15.5)
WBC: 5.5 10*3/uL (ref 4.0–10.5)

## 2015-04-30 LAB — PROTIME-INR
INR: 3.89 — AB (ref 0.00–1.49)
PROTHROMBIN TIME: 37.2 s — AB (ref 11.6–15.2)

## 2015-04-30 MED ORDER — ONDANSETRON HCL 4 MG/2ML IJ SOLN
4.0000 mg | Freq: Four times a day (QID) | INTRAMUSCULAR | Status: DC | PRN
  Administered 2015-04-30 (×2): 4 mg via INTRAVENOUS
  Filled 2015-04-30 (×2): qty 2

## 2015-04-30 MED ORDER — ACETAMINOPHEN 325 MG PO TABS: 650.0000 mg | ORAL_TABLET | ORAL | Status: DC | PRN

## 2015-04-30 MED ORDER — WARFARIN SODIUM 5 MG PO TABS: 2.5000 mg | ORAL_TABLET | ORAL | Status: DC

## 2015-04-30 MED ORDER — ONDANSETRON HCL 4 MG/2ML IJ SOLN
INTRAMUSCULAR | Status: AC
  Administered 2015-04-30: 2 mg
  Filled 2015-04-30: qty 2

## 2015-04-30 MED ORDER — ENSURE ENLIVE PO LIQD
237.0000 mL | Freq: Two times a day (BID) | ORAL | Status: DC
  Administered 2015-04-30 (×2): 237 mL via ORAL

## 2015-04-30 MED ORDER — ACETAMINOPHEN 325 MG PO TABS
650.0000 mg | ORAL_TABLET | ORAL | Status: DC | PRN
  Administered 2015-04-30: 650 mg via ORAL
  Filled 2015-04-30: qty 2

## 2015-04-30 NOTE — Discharge Summary (Signed)
Physician Discharge Summary  CAMMI CONSALVO MWN:[id number] DOB: 01/08/1946 DOA: 04/29/2015  PCP: Owens Loffler, MD  Admit date: 04/29/2015 Discharge date: 04/30/2015  Time spent: > 35 minutes  Recommendations for Outpatient Follow-up:  1. Reassess INR prior to starting coumadin (these instructions will be provided to patient) she is to hold her coumadin 2. 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  History of present illness:  From original HPI: 69 y.o. 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 Patient was in her usual state of health this morning, she was trying to go down stairs down to the pool and she slipped and landed on her left side. She was unable to get up because of severe pain, patient brought to the hospital for further evaluation.  Hospital Course:  Pelvic fracture - According to Ortho: CT scan reviewed Case discussed with Dr Marcelino Scot No indication for surgical management at this time WBAT with assistive device Pain medication per medical service Will f/u with me in 2 weeks for repeat xrays Also will need DVT prevention - coumadin or ASA. Will defer to Medical team Also need to f/u with PCP concerning osteoporosis management   Supratherapeutic INR - Patient to hold her coumadin for the next 2-4 days will need to recheck an INR prior to continuing.    Procedures:  None  Consultations:  Ortho  Discharge Exam: Filed Vitals:   04/30/15 1300  BP: 101/45  Pulse: 66  Temp: 98.5 F (36.9 C)  Resp: 16    General: Pt in nad, alert and awake Cardiovascular: s1 and s2 present, no rubs Respiratory: cta bl, no wheezes  Discharge Instructions   Discharge Instructions    Call MD for:  extreme fatigue    Complete by:  As directed      Call MD for:  severe uncontrolled pain    Complete by:  As directed      Call MD for:  temperature >100.4    Complete by:  As directed      Diet - low sodium heart healthy    Complete by:  As directed      Discharge instructions    Complete by:  As directed   WBAT with assistive device Pain medication per medical service Will f/u with me in 2 weeks for repeat xrays Also will need DVT prevention - coumadin or ASA. Will defer to Medical team Also need to f/u with PCP concerning osteoporosis management      Increase activity slowly    Complete by:  As directed           Current Discharge Medication List    START taking these medications   Details  acetaminophen (TYLENOL) 325 MG tablet Take 2 tablets (650 mg total) by mouth every 4 (four) hours as needed for mild pain, fever or headache. Qty: 30 tablet, Refills: 0      CONTINUE these medications which have CHANGED  Details  warfarin (COUMADIN) 5 MG tablet Take 0.5-1 tablets (2.5-5 mg total) by mouth See admin instructions. Pt takes 2.5mg  Sunday, Wednesday and Friday - pt takes 5mg  on all other days at 1800  Please obtain repeat INR prior to continuing coumadin Qty: 30 tablet, Refills: 0      CONTINUE these medications which have NOT CHANGED   Details  amiodarone (PACERONE) 200 MG tablet Take 1 tablet (200 mg total) by mouth daily. Qty: 30 tablet, Refills: 3    carvedilol (COREG) 3.125 MG tablet Take 1 tablet (3.125 mg total) by mouth 2 (two) times daily. Qty: 60 tablet, Refills: 5   Associated Diagnoses: Atrial fibrillation,  unspecified    levothyroxine (LEVOTHROID) 25 MCG tablet Take 2.5 tablets (62.5 mcg total) by mouth daily before breakfast. Qty: 75 tablet, Refills: 3    lisinopril (PRINIVIL,ZESTRIL) 5 MG tablet Take 1 tablet (5 mg total) by mouth daily. Qty: 30 tablet, Refills: 3      STOP taking these medications     potassium chloride SA (K-DUR,KLOR-CON) 20 MEQ tablet        No Known Allergies    The results of significant diagnostics from this hospitalization (including imaging, microbiology, ancillary and laboratory) are listed below for reference.    Significant Diagnostic Studies: Dg Chest 1 View  04/29/2015   CLINICAL DATA:  Status post fall with left hip pain.  EXAM: CHEST  1 VIEW  COMPARISON:  10/03/2014  FINDINGS: Median sternotomy wires are intact.  Cardiomediastinal silhouette is normal. Mediastinal contours appear intact. The aorta is torturous.  There is no evidence of focal airspace consolidation, pleural effusion or pneumothorax.  Osseous structures are without acute abnormality. There is a linear radiodense structure seen overlying the right hemithorax with uncertain significance.  IMPRESSION: No radiographic evidence of acute cardiopulmonary abnormality.  Linear radiodense structure overlying the right hemithorax with uncertain significance. It may be external to the body. Clinical correlation is advised.   Electronically Signed   By: Fidela Salisbury M.D.   On: 04/29/2015 13:28   Ct Pelvis Wo Contrast  04/29/2015   CLINICAL DATA:  Fall down stairs. Left groin pain radiating to the pelvis. Left pelvic fracture.  EXAM: CT PELVIS WITHOUT CONTRAST  TECHNIQUE: Multidetector CT imaging of the pelvis was performed following the standard protocol without intravenous contrast.  COMPARISON:  04/29/2015; 09/10/2012  FINDINGS: Vertical fracture left sacral ala partially extending into the sacroiliac joint. The transverse component of the fracture observed at the junction of the S4-5 level.   Nondisplaced fracture of the medial portion of the superior pubic ramus, adjacent to the anterior acetabular wall.  Minimally (2 mm) displaced fracture of the midportion of the inferior pubic ramus.  No significant SI joint diastases. There is hematoma anterior to the left sacral fracture, tracking adjacent to the left sacral plexus and towards the left sciatic notch, and displacing the sciatic nerve above the level of the sciatic notch.  Hematoma tracks along the left hip adductor musculature.  Foley catheter in the urinary bladder. Prominent stool throughout the colon favors constipation. Appendix normal. Uterus absent. No fracture involving the acetabular surface itself is identified.  IMPRESSION: 1. Vertical fracture of the left sacral ala with some extension into the left sacroiliac joint, and with a transverse component of the sacral fracture at the S4-5 level. 2. Transverse fracture of the midportion of the inferior pubic ramus with adjacent hematoma tracking in the left hip adductor musculature. 3. There is a fracture of the medial  most margin of the left superior pubic ramus, adjacent to the anterior wall of the left acetabulum. 4. There is some hematoma tracking along the sacral fracture, displacing part of the sacral plexus and the upper portion of the sciatic nerve.   Electronically Signed   By: Van Clines M.D.   On: 04/29/2015 15:23   Mm Screening Breast Tomo Bilateral  04/15/2015   CLINICAL DATA:  Screening.  EXAM: DIGITAL SCREENING BILATERAL MAMMOGRAM WITH 3D TOMO WITH CAD  COMPARISON:  Previous exam(s).  ACR Breast Density Category c: The breast tissue is heterogeneously dense, which may obscure small masses.  FINDINGS: There are no findings suspicious for malignancy. Images were processed with CAD.  IMPRESSION: No mammographic evidence of malignancy. A result letter of this screening mammogram will be mailed directly to the patient.  RECOMMENDATION: Screening mammogram in one year.  (Code:SM-B-01Y)  BI-RADS CATEGORY  1: Negative.   Electronically Signed   By: Franki Cabot M.D.   On: 04/15/2015 08:39   Dg Hip Unilat With Pelvis 2-3 Views Left  04/29/2015   CLINICAL DATA:  Pain following fall  EXAM: DG HIP (WITH OR WITHOUT PELVIS) 2-3V LEFT  COMPARISON:  None.  FINDINGS: Frontal pelvis as well as frontal and lateral left hip images were obtained. There is a fracture of the mid left ischium. There is an apparent incomplete fracture along the lateral left superior pubic ramus. No other fractures are apparent. No dislocation. Joint spaces appear intact. There is a small bone island in the lateral left iliac crest.  IMPRESSION: Transversely oriented fracture mid left ischium. Incomplete fracture lateral left superior pubic ramus. Alignment in these areas is essentially anatomic. No other fractures. No dislocation.   Electronically Signed   By: Lowella Grip III M.D.   On: 04/29/2015 13:27    Microbiology: No results found for this or any previous visit (from the past 240 hour(s)).   Labs: Basic Metabolic Panel:  Recent Labs Lab 04/29/15 1209 04/30/15 0548  NA 139 137  K 4.5 4.0  CL 106 105  CO2 25 25  GLUCOSE 117* 97  BUN 12 11  CREATININE 1.01* 0.92  CALCIUM 9.1 8.4*   Liver Function Tests: No results for input(s): AST, ALT, ALKPHOS, BILITOT, PROT, ALBUMIN in the last 168 hours. No results for input(s): LIPASE, AMYLASE in the last 168 hours. No results for input(s): AMMONIA in the last 168 hours. CBC:  Recent Labs Lab 04/29/15 1209 04/30/15 0548  WBC 7.1 5.5  NEUTROABS 4.9  --   HGB 12.8 11.7*  HCT 38.7 34.8*  MCV 100.8* 100.3*  PLT 212 156   Cardiac Enzymes: No results for input(s): CKTOTAL, CKMB, CKMBINDEX, TROPONINI in the last 168 hours. BNP: BNP (last 3 results)  Recent Labs  10/03/14 1804 10/15/14 1251  BNP 1697.7* 691.1*    ProBNP (last 3 results) No results for input(s): PROBNP in the last 8760 hours.  CBG: No results for  input(s): GLUCAP in the last 168 hours.  Signed:  Velvet Bathe  [facility] 04/30/2015, 5:00 PM

## 2015-04-30 NOTE — Evaluation (Addendum)
Physical Therapy Evaluation Patient Details Name: [name] MRN: [id number] DOB: 23-Oct-1945 Today's Date: 04/30/2015   History of Present Illness  Patient is a 69 y/o female s/p mechanical fall at home presents with left pelvic fx. Xray-fracture of the left ischium and the left superior pubic ramus.  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 Requires Min A for ambulation. Pt reluctant to place weight through LLE due to pain. Lengthy discussion re: safety concerns at home, stair negotiation, mobility etc. Expressed concerns about negotiating steps to enter home however pt reports her husband will pick her up to get her into the house. Pt reports she has a w/c at home. Would really benefit from another session with PT however pt eager to go home today. If pt discharges, will need HHPT, RW, 3in1 and hands on 24/7 assist for all mobility as pt high falls risk. Pt agreeable. Will follow acutely to maximize independence and mobility.     Follow Up Recommendations Home health PT;Supervision/Assistance - 24 hour    Equipment Recommendations  Rolling walker with 5" wheels;3in1 (PT)    Recommendations for Other Services OT consult     Precautions / Restrictions Precautions Precautions: Fall Restrictions Weight Bearing Restrictions: Yes RLE Weight Bearing: Weight bearing as tolerated LLE Weight Bearing: Weight bearing as tolerated      Mobility  Bed Mobility               General bed mobility comments: Sitting EOB upon PT arrival.  Transfers Overall transfer level: Needs assistance Equipment used: Rolling walker (2 wheeled) Transfers: Sit to/from Stand Sit to Stand: Min guard         General transfer comment: Min guard to boost from EOB. Despite cues  for hand placement, pt pulling up on RW. Reluctant to place weight through LLE.  Ambulation/Gait Ambulation/Gait assistance: Min assist Ambulation Distance (Feet): 10 Feet Assistive device: Rolling walker (2 wheeled) Gait Pattern/deviations: Step-to pattern;Decreased stride length;Decreased stance time - left;Decreased step length - right;Trunk flexed   Gait velocity interpretation: <1.8 ft/sec, indicative of risk for recurrent falls General Gait Details: Pt reluctant to place weight through LLE-touching down with toe secondary to pain. Increased WB through BUEs to offload LLE. Min A for balance.   Stairs            Wheelchair Mobility    Modified Rankin (Stroke Patients Only)       Balance Overall balance assessment: Needs assistance Sitting-balance support: Feet supported;No upper extremity supported Sitting balance-Leahy Scale: Fair Sitting balance - Comments: Not able to bring LEs up to donn socks or reach down due to pain. Total A to donn socks.   Standing balance support: During functional activity Standing balance-Leahy Scale: Poor Standing balance comment: Relient on RW for support.                              Pertinent Vitals/Pain Pain Assessment: Faces Faces Pain Scale: Hurts whole lot Pain Location: left groin Pain Descriptors / Indicators: Sharp;Sore;Grimacing;Guarding Pain Intervention(s): Limited activity within patient's tolerance;Monitored during session;Repositioned    Home Living Family/patient expects to be discharged to:: Private residence Living Arrangements: Spouse/significant other Available Help at Discharge: Family;Available 24 hours/day Type of Home: House Home Access: Stairs to enter  Entrance Stairs-Number of Steps: 3 Home Layout: One level Home Equipment: Wheelchair - manual      Prior Function Level of Independence: Independent               Hand Dominance        Extremity/Trunk Assessment   Upper  Extremity Assessment: Defer to OT evaluation           Lower Extremity Assessment: LLE deficits/detail   LLE Deficits / Details: Limited AROM/strength secondary to pain.     Communication   Communication: No difficulties  Cognition Arousal/Alertness: Awake/alert Behavior During Therapy: WFL for tasks assessed/performed Overall Cognitive Status: Within Functional Limits for tasks assessed                      General Comments General comments (skin integrity, edema, etc.): Pt very frustrated with being in hospital and eager to return home.     Exercises        Assessment/Plan    PT Assessment Patient needs continued PT services  PT Diagnosis Difficulty walking;Acute pain   PT Problem List Decreased strength;Pain;Decreased range of motion;Decreased activity tolerance;Decreased balance;Decreased mobility;Decreased knowledge of use of DME  PT Treatment Interventions Balance training;Gait training;Functional mobility training;Therapeutic activities;Therapeutic exercise;Patient/family education;Stair training;DME instruction   PT Goals (Current goals can be found in the Care Plan section) Acute Rehab PT Goals Patient Stated Goal: to go home right now PT Goal Formulation: With patient Time For Goal Achievement: 05/14/15 Potential to Achieve Goals: Fair    Frequency Min 4X/week   Barriers to discharge Inaccessible home environment 3 steps to enter home    Co-evaluation               End of Session Equipment Utilized During Treatment: Gait belt Activity Tolerance: Patient limited by pain Patient left: in bed;with call bell/phone within reach Nurse Communication: Mobility status;Weight bearing status         Time: 1538-1610 PT Time Calculation (min) (ACUTE ONLY): 32 min   Charges:   PT Evaluation $Initial PT Evaluation Tier I: 1 Procedure PT Treatments $Therapeutic Activity: 8-22 mins   PT G Codes:        [name] 04/30/2015, 4:27  PM [name], [place], DPT [phone]

## 2015-04-30 NOTE — Progress Notes (Signed)
Initial Nutrition Assessment  DOCUMENTATION CODES:   Not applicable  INTERVENTION:   Provide Ensure Enlive po BID, each supplement provides 350 kcal and 20 grams of protein.  Encourage adequate PO intake.  NUTRITION DIAGNOSIS:   Inadequate oral intake related to poor appetite, nausea as evidenced by meal completion < 25%.  GOAL:   Patient will meet greater than or equal to 90% of their needs  MONITOR:   PO intake, Supplement acceptance, Weight trends, I & O's, Labs  REASON FOR ASSESSMENT:   Consult Hip fracture protocol  ASSESSMENT:   69 y.o. 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  X-ray of the hip showed fracture of the left ischium and the left superior pubic ramus.  Pt reports having a lack of appetite since admission due to nausea. Current meal completion has been 10%. PTA pt reports eating well with no other difficulties. No recent body weight recorded, thus pt was weighed on bed scale during time of visit, which revealed 148 lbs. Usual body weight reported to be 142 lbs. Weight has been stable. Pt is agreeable to Ensure to aid in caloric and protein needs. RD to order. Pt was encouraged to eat her food at meals.  Nutrition-Focused physical exam completed. Findings are no fat depletion, moderate muscle depletion, and no edema.   Labs and medications reviewed.   Diet Order:  Diet Heart Room service appropriate?: Yes; Fluid consistency:: Thin  Skin:  Reviewed, no issues  Last BM:  8/31  Height:   Ht Readings from Last 1 Encounters:  03/04/15 5' 9.5" (1.765 m)    Weight:   Wt Readings from Last 1 Encounters:  03/04/15 141 lb 8 oz (64.184 kg)    Ideal Body Weight:  67 kg  BMI:  Body Mass Index: 21.68 kg/(m^2)  Estimated Nutritional Needs:   Kcal:  1750-1950  Protein:  80-90 grams  Fluid:   1.7-1.9 L/day  EDUCATION NEEDS:   No education needs identified at this time  Corrin Parker, MS, RD, LDN Pager # [phone] After hours/ weekend pager # [phone]

## 2015-04-30 NOTE — Progress Notes (Signed)
Utilization review completed.  

## 2015-04-30 NOTE — Progress Notes (Signed)
Pt upset wants to go home Dr Rolena Infante notified stated to call Dr Wendee Beavers stated he will be up to see pt after he  Makes rounds

## 2015-05-01 ENCOUNTER — Telehealth: Payer: Self-pay | Admitting: Family Medicine

## 2015-05-01 NOTE — Telephone Encounter (Signed)
Pt left message with Triage. Pt has a fracture hip (in 2 places), pt saw ortho who told her they are not going to operate on him and advise her to take OTC med like Tylenol. Pt said she is in severe pain and is requesting some type of pain med, pt said she has PT tomorrow and she is in so much pain she isn't sure if she can even do her PT.   Pt said her PT name is Doran Durand and her phone # [phone]

## 2015-05-01 NOTE — Telephone Encounter (Signed)
Agree. Needs to be seen in ER if in severe pain. Or call ortho on call.

## 2015-05-01 NOTE — Telephone Encounter (Signed)
Spoke with [name] and she advise me that we haven't seen pt for problems and she needs to call ortho to see what they say because Dr. Lorelei Pont isn't in office until next Wednesday  I advise pt of that and she said she is in so much pain that she can't take it anymore, I advise pt that if she is in that much pain she needs to go to ER or UC so someone can eval her. Pt said she is not going back to the hospital and she will see what Dr. Lorelei Pont says and that she might "just pop up here next week" to request pain med

## 2015-05-01 NOTE — Care Management Note (Signed)
Case Management Note  Patient Details  Name: [name] MRN: [id number] Date of Birth: June 20, 1946  Subjective/Objective:            Admitted with fracture of left pelvis        Action/Plan: Spoke with patient about home health on 04/30/15, she selected [facility] from [employer]. Gave patient their phone # and explained that they would contact her on 05/01/15 to schedule home physical therapy visits. Verified patient's phone # and address. Contacted James with [facility] regarding DME, able to provide the patient with a rolling walker from floor stock prior to discharge and [facility] will deliver a 3N1 to patient's home on 05/01/15. Patient and her husband stated  that her husband would be able to assist her after discharge. Contacted Miranda at [facility] this am and set up HHPT. Contacted James at [facility] and confirmed that 3N1 will be delivered to patient's home today.    Expected Discharge Date:                  Expected Discharge Plan:  [place]  In-House Referral:  NA  Discharge planning Services  CM Consult  Post Acute Care Choice:  Durable Medical Equipment, Home Health Choice offered to:  Patient  DME Arranged:  3-N-1, Walker rolling DME Agency:  [place]:  PT [place]:  [place]  Status of Service:  Completed, signed off  Medicare Important Message Given:    Date Medicare IM Given:    Medicare IM give by:    Date Additional Medicare IM Given:    Additional Medicare Important Message give by:     If discussed at [place] of Stay Meetings, dates discussed:    Additional Comments:  Nila Nephew, RN 05/01/2015, 8:41 AM

## 2015-05-05 LAB — PROTIME-INR: INR: 11.5 — AB (ref <=1.1)

## 2015-05-05 MED ORDER — HYDROCODONE-ACETAMINOPHEN 5-325 MG PO TABS
ORAL_TABLET | ORAL | Status: DC
Start: 2015-05-05 — End: 2015-05-19

## 2015-05-05 NOTE — Telephone Encounter (Signed)
Case reviewed, non-surgical pelvic fracture managed by Dr. Rolena Infante. It is certainly reasonable to use narcotics in a pelvic fracture.   [name] and Dr. B please assist.   ASAP:  Norco 5/325, 1 po q 4-6 hours prn pain, #40, 0 refills  [name], please have Home health obtain an INR today if possible. I am going to copy [name] to see if she will assist with remote coumadin management.  Electronically Signed  By: Owens Loffler, MD On: 05/05/2015 9:34 AM

## 2015-05-05 NOTE — Telephone Encounter (Signed)
PLEASE NOTE: All timestamps contained within this report are represented as [place] Standard Time. CONFIDENTIALTY NOTICE: This fax transmission is intended only for the addressee. It contains information that is legally privileged, confidential or otherwise protected from use or disclosure. If you are not the intended recipient, you are strictly prohibited from reviewing, disclosing, copying using or disseminating any of this information or taking any action in reliance on or regarding this information. If you have received this fax in error, please notify us immediately by telephone so that we can arrange for its return to [place]. Phone: [phone], Toll-Free: [phone], Fax: [phone] Page: 1 of 2 Call Id: [id number] [place] Patient Name: [name] Gender: Female DOB: 05-08-1946 Age: 69 Y 56 M 10 D Return Phone Number: Address: City/State/Zip: [place] Client [place] Night - Client Client Site [facility] - Night Physician [name], [place] Contact Type Call Call Type Triage / Clinical Caller Name Jones Skene w/ [facility] Relationship To Patient Provider Return Phone Number Please choose phone number Chief Complaint Paging or Request for Consult Initial Comment Caller states her patient had a pelvic fracture. Is in quite a bit of pain. Would like to know if there is anything else they can do for pain other than Tylenol or Advil. Also, patient was supposed to have PT/INR and missed appt. Would like to know if the doctor would send orders home health can draw them for the doctor. Jones Skene physical therapist w/ [facility] [phone] Nurse Assessment Nurse: Malva Cogan, RN, Juliann Pulse Date/Time Eilene Ghazi Time): 05/02/2015 11:14:26 AM Confirm and document reason for call. If symptomatic, describe symptoms. ---Caller states that she is a  physical therapist with [facility], is not currently with pt but states that pt has a fractured pelvis & is having some pain & has been taking tylenol only & caller was wanting to know if there is anything else that pt can take for her pain. Caller advised that in order for triager to address pt's symptoms that pt needs to call back at same number that caller originally dialed. Caller also advised that controlled substances are not called in after hrs. Caller also states that pt was supposed to have a PT/INR drawn on Wednesday but missed her appt because she couldn't get out of her house to make her appt. Caller is wanting to know if orders can be rec'd to have [facility] to draw her labs. D/W charge nurse Deb prior to calling caller & Deb advised triager that nothing can be done about this over w/e & that office will have to be contacted after holiday, caller advised of this. Caller is just wanting documented that call was made & to be placed on pt's record. Has the patient traveled out of the country within the last 30 days? ---No Does the patient require triage? ---No Please document clinical information provided and list any resource used. ---See previous note. Guidelines Guideline Title Affirmed Question Affirmed Notes Nurse Date/Time (Eastern Time) Disp. Time Eilene Ghazi Time) Disposition Final User PLEASE NOTE: All timestamps contained within this report are represented as [place] Standard Time. CONFIDENTIALTY NOTICE: This fax transmission is intended only for the addressee. It contains information that is legally privileged, confidential or otherwise protected from use or disclosure. If you are not the intended recipient, you are strictly prohibited from reviewing, disclosing, copying using or disseminating any of this information or taking any  action in reliance on or regarding this information. If you have received this fax in error, please notify us immediately by  telephone so that we can arrange for its return to [place]. Phone: [phone], Toll-Free: [phone], Fax: [phone] Page: 2 of 2 Call Id: [id number] 05/02/2015 11:14:15 AM Clinical Call Yes Malva Cogan, RN, Juliann Pulse After Care Instructions Given Call Event Type User Date / Time Description

## 2015-05-05 NOTE — Telephone Encounter (Signed)
[name] with [facility] called; PT was > 96 and INR was > 8. Pt also complaining of foul odor of urine; [name] is requesting verbal order for nurse to draw venopuncture for PT/INR and U/A and C/S for urine; advised per [name] CMA  OK to do order as requested and pt should not take warfarin until hears from Dr Rometta Emery office. [name] voiced understanding.

## 2015-05-05 NOTE — Telephone Encounter (Signed)
Noted. Will follow closely.

## 2015-05-05 NOTE — Telephone Encounter (Signed)
Spoke with Jeani Hawking at [facility].  Verbal order given to check PT/INR.  She will be going out today to see Mrs. Hymon and will check it then.  Mrs. Firman also notified that [facility] will be checking her PT/INR today as well as I have a prescription for her to pick up at the front desk for pain medicine.  She will send Mr. Wnek to pick up Rx.

## 2015-05-06 ENCOUNTER — Ambulatory Visit (INDEPENDENT_AMBULATORY_CARE_PROVIDER_SITE_OTHER): Payer: Medicare Other

## 2015-05-06 ENCOUNTER — Telehealth: Payer: Self-pay | Admitting: *Deleted

## 2015-05-06 DIAGNOSIS — I480 Paroxysmal atrial fibrillation: Secondary | ICD-10-CM

## 2015-05-06 DIAGNOSIS — I4892 Unspecified atrial flutter: Secondary | ICD-10-CM

## 2015-05-06 MED ORDER — PHYTONADIONE 5 MG PO TABS: 5.0000 mg | ORAL_TABLET | Freq: Once | ORAL | Status: DC

## 2015-05-06 NOTE — Telephone Encounter (Signed)
[name] does not carry Vit K.  Called [facility] and [facility] and they also do not carry Vit K.  Called [name] at [facility] in [place] and they do not keep Vit K in office either.  She will call [facility] to see if she can get Vit K from then.  She states she had also contacted [facility] and advised them to call her with the PT/INR results since she is the one that normally manages [name]'s warfarin.  She will call [name] with [facility] to let her know where to get the Vit K from.

## 2015-05-06 NOTE — Telephone Encounter (Signed)
All noted, I appreciate Erika's assistance. We have long had difficulty with compliance and coumadin with this patient. Extensive conversations with the patient, her husband, and ultimately I decided to have Cardiology coumadin clinic involved with her management. Since this change, she has done better compared to previously.

## 2015-05-06 NOTE — Telephone Encounter (Signed)
Becky with [facility] called with verbal PT/INR venous results.  INR 11.5.  Per Dr. Lorelei Pont, will have patient take Vitamin K 5 mg  Today.  Rx sent to pharmacy.  Will recheck PT/INR 05/07/2015.  Advised [facility] to make sure patient is not taking any warfarin.

## 2015-05-07 ENCOUNTER — Ambulatory Visit (INDEPENDENT_AMBULATORY_CARE_PROVIDER_SITE_OTHER): Payer: Medicare Other | Admitting: Cardiology

## 2015-05-07 ENCOUNTER — Telehealth: Payer: Self-pay | Admitting: *Deleted

## 2015-05-07 DIAGNOSIS — I4892 Unspecified atrial flutter: Secondary | ICD-10-CM

## 2015-05-07 DIAGNOSIS — I480 Paroxysmal atrial fibrillation: Secondary | ICD-10-CM

## 2015-05-07 LAB — POCT INR: INR: 2.4

## 2015-05-07 NOTE — Telephone Encounter (Signed)
Pt called to make you aware that she fell and broke her left hip last week, so she canceled her lab appt. I told her that it was ok, that  I would make you aware, and that she could call to reschedule when she is able to get around.

## 2015-05-08 ENCOUNTER — Other Ambulatory Visit: Payer: Medicare Other

## 2015-05-08 NOTE — Telephone Encounter (Signed)
known 

## 2015-05-09 ENCOUNTER — Telehealth: Payer: Self-pay | Admitting: Internal Medicine

## 2015-05-09 NOTE — Telephone Encounter (Signed)
Call from home health-- Linda Hedges not done yesterday Order to do today  Reviewed and had protime at 2.4 on 9/8 Instructed to have her restart at 2.5mg  daily Recheck on Monday

## 2015-05-11 ENCOUNTER — Ambulatory Visit (INDEPENDENT_AMBULATORY_CARE_PROVIDER_SITE_OTHER): Payer: Medicare Other | Admitting: Pharmacist

## 2015-05-11 DIAGNOSIS — I4892 Unspecified atrial flutter: Secondary | ICD-10-CM

## 2015-05-11 DIAGNOSIS — I480 Paroxysmal atrial fibrillation: Secondary | ICD-10-CM

## 2015-05-11 LAB — POCT INR: INR: 1.3

## 2015-05-11 NOTE — Telephone Encounter (Signed)
PLEASE NOTE: All timestamps contained within this report are represented as [place] Standard Time. CONFIDENTIALTY NOTICE: This fax transmission is intended only for the addressee. It contains information that is legally privileged, confidential or otherwise protected from use or disclosure. If you are not the intended recipient, you are strictly prohibited from reviewing, disclosing, copying using or disseminating any of this information or taking any action in reliance on or regarding this information. If you have received this fax in error, please notify us immediately by telephone so that we can arrange for its return to [place]. Phone: [phone], Toll-Free: [phone], Fax: [phone] Page: 1 of 1 Call Id: [id number] [place] Patient Name: [name] Gender: Female DOB: April 02, 1946 Age: 69 Y 57 M 17 D Return Phone Number: Address: City/State/Zip: [place] Client [place] Night - Client Client Site [place] Physician [name], [place] Type Call Call Type Page Only Caller Name Rip Harbour Relationship To Patient Provider Is this call to report lab results? No Return Phone Number Please choose phone number Initial Comment caller is Rip Harbour with [facility] - is calling to get orders for a PT/INR for a pt of Dr Lorelei Pont. CB# [phone] Nurse Assessment Guidelines Guideline Title Affirmed Question Affirmed Notes Nurse Date/Time Eilene Ghazi Time) Disp. Time Eilene Ghazi Time) Disposition Final User 05/09/2015 9:31:02 AM Send to [place], Mary Ann 05/09/2015 9:35:12 AM Send to [facility] Paging Queue [place], [place] 05/09/2015 9:41:00 AM Paged On Call to Other Provider Alphonsa Overall 05/09/2015 9:41:18 AM Page Completed Yes Alphonsa Overall After Care Instructions Given Call Event Type User Date / Time  Description Paging DoctorName Phone DateTime Result/Outcome Message Type Notes Viviana Simpler [phone] 05/09/2015 9:40:59 AM Paged On Call to Other Provider Doctor Paged Please call [facility] regarding t orders for a PT/INR on the patient. CB# [phone] Viviana Simpler 05/09/2015 9:41:03 AM Spoke with On Call - General Message Result

## 2015-05-11 NOTE — Telephone Encounter (Signed)
Noted  

## 2015-05-11 NOTE — Telephone Encounter (Signed)
They should contact cardiology coumadin clinic who manages her INR

## 2015-05-11 NOTE — Telephone Encounter (Addendum)
Dr Lorelei Pont has already addressed issue in this phone note. Dr Lorelei Pont had already sent note to [facility]; so will forward to [facility].

## 2015-05-13 DIAGNOSIS — S329XXD Fracture of unspecified parts of lumbosacral spine and pelvis, subsequent encounter for fracture with routine healing: Secondary | ICD-10-CM | POA: Diagnosis not present

## 2015-05-13 DIAGNOSIS — I059 Rheumatic mitral valve disease, unspecified: Secondary | ICD-10-CM | POA: Diagnosis not present

## 2015-05-13 DIAGNOSIS — I480 Paroxysmal atrial fibrillation: Secondary | ICD-10-CM | POA: Diagnosis not present

## 2015-05-13 DIAGNOSIS — F329 Major depressive disorder, single episode, unspecified: Secondary | ICD-10-CM | POA: Diagnosis not present

## 2015-05-15 ENCOUNTER — Ambulatory Visit (INDEPENDENT_AMBULATORY_CARE_PROVIDER_SITE_OTHER): Payer: Medicare Other | Admitting: Internal Medicine

## 2015-05-15 DIAGNOSIS — I4892 Unspecified atrial flutter: Secondary | ICD-10-CM

## 2015-05-15 DIAGNOSIS — I480 Paroxysmal atrial fibrillation: Secondary | ICD-10-CM

## 2015-05-15 LAB — POCT INR: INR: 1.2

## 2015-05-19 ENCOUNTER — Telehealth: Payer: Self-pay | Admitting: Family Medicine

## 2015-05-19 ENCOUNTER — Other Ambulatory Visit: Payer: Self-pay | Admitting: Family Medicine

## 2015-05-19 NOTE — Telephone Encounter (Signed)
Last filled 05/05/2015 for #40.  Ok to refill?

## 2015-05-19 NOTE — Telephone Encounter (Signed)
Verbal order given to Elder Cyphers with [facility] for 3 additional weeks of PT in the home.

## 2015-05-19 NOTE — Telephone Encounter (Signed)
Orthopedics has declined to manage this pelvic fracture patient's pain, so I will do so for a short interval.

## 2015-05-19 NOTE — Telephone Encounter (Signed)
Elder Cyphers called wanting to get another order for ms [place] For 3 additional week of PT in the home

## 2015-05-19 NOTE — Telephone Encounter (Signed)
yes

## 2015-05-20 NOTE — Telephone Encounter (Signed)
Left message for Mrs. Linhardt that her prescriptions is ready to be picked up at the front desk.

## 2015-05-22 ENCOUNTER — Ambulatory Visit (INDEPENDENT_AMBULATORY_CARE_PROVIDER_SITE_OTHER): Payer: Medicare Other | Admitting: Cardiology

## 2015-05-22 DIAGNOSIS — I480 Paroxysmal atrial fibrillation: Secondary | ICD-10-CM

## 2015-05-22 DIAGNOSIS — I4892 Unspecified atrial flutter: Secondary | ICD-10-CM

## 2015-05-22 LAB — POCT INR: INR: 2.1

## 2015-05-29 ENCOUNTER — Ambulatory Visit (INDEPENDENT_AMBULATORY_CARE_PROVIDER_SITE_OTHER): Payer: Medicare Other | Admitting: Cardiology

## 2015-05-29 DIAGNOSIS — I4892 Unspecified atrial flutter: Secondary | ICD-10-CM

## 2015-05-29 DIAGNOSIS — I480 Paroxysmal atrial fibrillation: Secondary | ICD-10-CM

## 2015-05-29 LAB — POCT INR: INR: 2.6

## 2015-06-04 ENCOUNTER — Telehealth: Payer: Self-pay | Admitting: *Deleted

## 2015-06-04 NOTE — Telephone Encounter (Signed)
Signed INR order was faxed back to [facility]

## 2015-06-05 ENCOUNTER — Ambulatory Visit (INDEPENDENT_AMBULATORY_CARE_PROVIDER_SITE_OTHER): Payer: Medicare Other | Admitting: Cardiology

## 2015-06-05 DIAGNOSIS — I480 Paroxysmal atrial fibrillation: Secondary | ICD-10-CM

## 2015-06-05 DIAGNOSIS — I4892 Unspecified atrial flutter: Secondary | ICD-10-CM

## 2015-06-05 LAB — POCT INR: INR: 3

## 2015-06-17 ENCOUNTER — Ambulatory Visit (INDEPENDENT_AMBULATORY_CARE_PROVIDER_SITE_OTHER): Payer: Medicare Other

## 2015-06-17 DIAGNOSIS — I4891 Unspecified atrial fibrillation: Secondary | ICD-10-CM | POA: Diagnosis not present

## 2015-06-17 DIAGNOSIS — I480 Paroxysmal atrial fibrillation: Secondary | ICD-10-CM

## 2015-06-17 DIAGNOSIS — I4892 Unspecified atrial flutter: Secondary | ICD-10-CM | POA: Diagnosis not present

## 2015-06-17 DIAGNOSIS — Z7901 Long term (current) use of anticoagulants: Secondary | ICD-10-CM | POA: Diagnosis not present

## 2015-06-17 DIAGNOSIS — Z5181 Encounter for therapeutic drug level monitoring: Secondary | ICD-10-CM | POA: Diagnosis not present

## 2015-06-17 LAB — POCT INR: INR: 2

## 2015-06-22 ENCOUNTER — Other Ambulatory Visit: Payer: Self-pay | Admitting: Cardiology

## 2015-06-23 NOTE — Telephone Encounter (Signed)
E sent to pharmacy 

## 2015-06-25 ENCOUNTER — Telehealth: Payer: Self-pay

## 2015-06-25 NOTE — Telephone Encounter (Signed)
Can you clarify questions.   i will likely not be able to call for 1-2 days

## 2015-06-25 NOTE — Telephone Encounter (Signed)
Spoke with [employer].  They think is would be a good idea for [name] to have a Bone Density Test given her recent fracture.  They are asking if Dr. Lorelei Pont agreed for him to please place a referral for patient to have done.  She lives in [place] so they are recommending sending patient somewhere in [place].  I recommended [facility].  We would need to be the one to call and schedule this appoinment for  [name] and she is agreeable to having this done.

## 2015-06-25 NOTE — Telephone Encounter (Signed)
I received a phone message from a health education program, regarding this patient. That are stating that they need clearance for this patient to join an Osteoporosis program and to know if she would be a good candidate. Called stated that she had a lot of other questions for Dr. Lorelei Pont, and that she would like to speak with him directly. Call back number is [phone].

## 2015-06-26 ENCOUNTER — Other Ambulatory Visit: Payer: Self-pay | Admitting: Family Medicine

## 2015-06-26 DIAGNOSIS — S72002A Fracture of unspecified part of neck of left femur, initial encounter for closed fracture: Secondary | ICD-10-CM

## 2015-06-26 DIAGNOSIS — M8580 Other specified disorders of bone density and structure, unspecified site: Secondary | ICD-10-CM

## 2015-06-27 ENCOUNTER — Ambulatory Visit (INDEPENDENT_AMBULATORY_CARE_PROVIDER_SITE_OTHER): Payer: Medicare Other

## 2015-06-27 DIAGNOSIS — Z23 Encounter for immunization: Secondary | ICD-10-CM | POA: Diagnosis not present

## 2015-07-07 ENCOUNTER — Other Ambulatory Visit: Payer: Medicare Other

## 2015-07-08 ENCOUNTER — Ambulatory Visit (INDEPENDENT_AMBULATORY_CARE_PROVIDER_SITE_OTHER): Payer: Medicare Other | Admitting: *Deleted

## 2015-07-08 DIAGNOSIS — Z5181 Encounter for therapeutic drug level monitoring: Secondary | ICD-10-CM

## 2015-07-08 DIAGNOSIS — I480 Paroxysmal atrial fibrillation: Secondary | ICD-10-CM

## 2015-07-08 DIAGNOSIS — I4891 Unspecified atrial fibrillation: Secondary | ICD-10-CM | POA: Diagnosis not present

## 2015-07-08 DIAGNOSIS — Z7901 Long term (current) use of anticoagulants: Secondary | ICD-10-CM | POA: Diagnosis not present

## 2015-07-08 DIAGNOSIS — I4892 Unspecified atrial flutter: Secondary | ICD-10-CM

## 2015-07-08 LAB — POCT INR: INR: 3.3

## 2015-07-10 ENCOUNTER — Ambulatory Visit (INDEPENDENT_AMBULATORY_CARE_PROVIDER_SITE_OTHER)
Admission: RE | Admit: 2015-07-10 | Discharge: 2015-07-10 | Disposition: A | Discharge status: Home / Self Care | Payer: Medicare Other | Source: Ambulatory Visit | Attending: Family Medicine | Admitting: Family Medicine

## 2015-07-10 DIAGNOSIS — S72002A Fracture of unspecified part of neck of left femur, initial encounter for closed fracture: Secondary | ICD-10-CM

## 2015-07-10 DIAGNOSIS — M8580 Other specified disorders of bone density and structure, unspecified site: Secondary | ICD-10-CM | POA: Diagnosis not present

## 2015-07-16 ENCOUNTER — Encounter: Payer: Self-pay | Admitting: *Deleted

## 2015-07-17 ENCOUNTER — Other Ambulatory Visit: Payer: Self-pay | Admitting: Internal Medicine

## 2015-07-17 ENCOUNTER — Other Ambulatory Visit: Payer: Self-pay | Admitting: Family Medicine

## 2015-07-20 ENCOUNTER — Other Ambulatory Visit: Payer: Self-pay | Admitting: Internal Medicine

## 2015-08-03 ENCOUNTER — Other Ambulatory Visit: Payer: Self-pay | Admitting: Internal Medicine

## 2015-08-03 NOTE — Telephone Encounter (Signed)
Leonie Man, MD at 03/04/2015 12:05 PM  carvedilol (COREG) 3.125 MG tabletTake 1 tablet (3.125 mg total) by mouth 2 (two) times daily Medication Instructions:  Your physician has recommended you make the following change in your medication:  INCREASE your lisinopril to 5mg  once per day. Take morning dose of coreg after you eat breakfast START lasix 20mg  once daily AS NEEDED for shortness of breath

## 2015-08-05 ENCOUNTER — Ambulatory Visit (INDEPENDENT_AMBULATORY_CARE_PROVIDER_SITE_OTHER): Payer: Medicare Other

## 2015-08-05 DIAGNOSIS — I4892 Unspecified atrial flutter: Secondary | ICD-10-CM | POA: Diagnosis not present

## 2015-08-05 DIAGNOSIS — Z7901 Long term (current) use of anticoagulants: Secondary | ICD-10-CM

## 2015-08-05 DIAGNOSIS — Z5181 Encounter for therapeutic drug level monitoring: Secondary | ICD-10-CM | POA: Diagnosis not present

## 2015-08-05 DIAGNOSIS — I480 Paroxysmal atrial fibrillation: Secondary | ICD-10-CM

## 2015-08-05 DIAGNOSIS — I4891 Unspecified atrial fibrillation: Secondary | ICD-10-CM

## 2015-08-05 LAB — POCT INR: INR: 1.7

## 2015-08-19 ENCOUNTER — Ambulatory Visit (INDEPENDENT_AMBULATORY_CARE_PROVIDER_SITE_OTHER): Payer: Medicare Other

## 2015-08-19 DIAGNOSIS — Z5181 Encounter for therapeutic drug level monitoring: Secondary | ICD-10-CM | POA: Diagnosis not present

## 2015-08-19 DIAGNOSIS — I4892 Unspecified atrial flutter: Secondary | ICD-10-CM

## 2015-08-19 DIAGNOSIS — I4891 Unspecified atrial fibrillation: Secondary | ICD-10-CM | POA: Diagnosis not present

## 2015-08-19 DIAGNOSIS — Z7901 Long term (current) use of anticoagulants: Secondary | ICD-10-CM | POA: Diagnosis not present

## 2015-08-19 DIAGNOSIS — I480 Paroxysmal atrial fibrillation: Secondary | ICD-10-CM

## 2015-08-19 LAB — POCT INR: INR: 2.1

## 2015-08-21 ENCOUNTER — Other Ambulatory Visit: Payer: Self-pay | Admitting: Family Medicine

## 2015-08-21 NOTE — Telephone Encounter (Signed)
Follow up scheduled 08/26/15

## 2015-08-26 ENCOUNTER — Encounter: Payer: Self-pay | Admitting: *Deleted

## 2015-08-26 ENCOUNTER — Ambulatory Visit (INDEPENDENT_AMBULATORY_CARE_PROVIDER_SITE_OTHER): Payer: Medicare Other | Admitting: Family Medicine

## 2015-08-26 ENCOUNTER — Encounter: Payer: Self-pay | Admitting: Family Medicine

## 2015-08-26 VITALS — BP 120/60 | HR 47 | Temp 98.6°F | Ht 69.5 in | Wt 140.5 lb

## 2015-08-26 DIAGNOSIS — S329XXS Fracture of unspecified parts of lumbosacral spine and pelvis, sequela: Secondary | ICD-10-CM

## 2015-08-26 DIAGNOSIS — D539 Nutritional anemia, unspecified: Secondary | ICD-10-CM | POA: Diagnosis not present

## 2015-08-26 DIAGNOSIS — E038 Other specified hypothyroidism: Secondary | ICD-10-CM

## 2015-08-26 DIAGNOSIS — R000 Tachycardia, unspecified: Secondary | ICD-10-CM

## 2015-08-26 DIAGNOSIS — Z23 Encounter for immunization: Secondary | ICD-10-CM | POA: Diagnosis not present

## 2015-08-26 DIAGNOSIS — I43 Cardiomyopathy in diseases classified elsewhere: Secondary | ICD-10-CM

## 2015-08-26 DIAGNOSIS — I5042 Chronic combined systolic (congestive) and diastolic (congestive) heart failure: Secondary | ICD-10-CM

## 2015-08-26 LAB — CBC WITH DIFFERENTIAL/PLATELET
Basophils Absolute: 0 10*3/uL (ref 0.0–0.1)
Basophils Relative: 0.9 % (ref 0.0–3.0)
EOS PCT: 2.8 % (ref 0.0–5.0)
Eosinophils Absolute: 0.1 10*3/uL (ref 0.0–0.7)
HEMATOCRIT: 38.4 % (ref 36.0–46.0)
HEMOGLOBIN: 12.6 g/dL (ref 12.0–15.0)
LYMPHS PCT: 26.2 % (ref 12.0–46.0)
Lymphs Abs: 1.3 10*3/uL (ref 0.7–4.0)
MCHC: 32.8 g/dL (ref 30.0–36.0)
MCV: 99.6 fl (ref 78.0–100.0)
MONOS PCT: 6.5 % (ref 3.0–12.0)
Monocytes Absolute: 0.3 10*3/uL (ref 0.1–1.0)
Neutro Abs: 3.1 10*3/uL (ref 1.4–7.7)
Neutrophils Relative %: 63.6 % (ref 43.0–77.0)
Platelets: 209 10*3/uL (ref 150.0–400.0)
RBC: 3.85 Mil/uL — AB (ref 3.87–5.11)
RDW: 14.4 % (ref 11.5–15.5)
WBC: 4.9 10*3/uL (ref 4.0–10.5)

## 2015-08-26 LAB — T3, FREE: T3, Free: 2.5 pg/mL (ref 2.3–4.2)

## 2015-08-26 LAB — T4, FREE: Free T4: 1.08 ng/dL (ref 0.60–1.60)

## 2015-08-26 LAB — TSH: TSH: 4.45 u[IU]/mL (ref 0.35–4.50)

## 2015-08-26 MED ORDER — DONEPEZIL HCL 5 MG PO TABS: 5.0000 mg | ORAL_TABLET | Freq: Every day | ORAL | Status: DC

## 2015-08-26 NOTE — Progress Notes (Signed)
Dr. Frederico Hamman T. Sydnee Lamour, MD, [facility] [place], [address] Phone: [phone] Fax: [phone]  08/26/2015  Patient: [name], MRN: [id number], DOB: 1946/03/27, 69 y.o.  Primary Physician:  Lelon Huh, MD   Chief Complaint  Patient presents with  . Follow-up    6 month   Subjective:   CANTRELL [name] is a 69 y.o. very pleasant female patient who presents with the following:  F/u fracture of l pelvis, osteopenia on DEXA  Thyroid: No symptoms. Labs reviewed. Denies cold / heat intolerance, dry skin, hair loss. No goiter.  Lab Results  Component Value Date   TSH 5.570* 03/10/2015    F/u cbc  CBC:    Component Value Date/Time   WBC 5.5 04/30/2015 0548   WBC 9.0 10/02/2014 0255   HGB 11.7* 04/30/2015 0548   HGB 12.3 10/02/2014 0255   HCT 34.8* 04/30/2015 0548   HCT 37.0 10/02/2014 0255   PLT 156 04/30/2015 0548   PLT 172 10/02/2014 0255   MCV 100.3* 04/30/2015 0548   MCV 99 10/02/2014 0255   NEUTROABS 4.9 04/29/2015 1209   NEUTROABS 7.6* 10/02/2014 0255   LYMPHSABS 1.6 04/29/2015 1209   LYMPHSABS 0.7* 10/02/2014 0255   MONOABS 0.4 04/29/2015 1209   MONOABS 0.7 10/02/2014 0255   EOSABS 0.2 04/29/2015 1209   EOSABS 0.0 10/02/2014 0255   BASOSABS 0.1 04/29/2015 1209   BASOSABS 0.0 10/02/2014 0255    Feeling better from fracture.  Took 2-3 months.   She is overall doing better, and she is not depressed or anxious at all right now. She is feeling better, she is more compliant with taking her medication. She has been taking all of her cardiac medicines.  Past Medical History, Surgical History, Social History, Family History, Problem List, Medications, and Allergies have been reviewed and updated if relevant.  Patient Active Problem List   Diagnosis Date Noted  . Mild dementia 10/24/2014    Priority: High  . Generalized anxiety disorder 11/18/2008    Priority: Medium  . Major depressive  disorder, recurrent episode, moderate with anxious distress ([place]) 11/18/2008    Priority: Medium  . Fracture of left pelvis ([place]) 04/29/2015  . PAF (paroxysmal atrial fibrillation) ([place]) 03/04/2015  . Atrial flutter ([place]) 03/04/2015  . Long term current use of antiarrhythmic medical therapy 11/14/2014  . On amiodarone therapy 11/14/2014  . Current use of long term anticoagulation 11/14/2014  . Malnutrition of moderate degree ([place]) 10/06/2014  . Chronic combined systolic and diastolic CHF, NYHA class 1 ([place]) 09/29/2014  . 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 Hyperlipidemia LDL goal <100 04/05/2010  . GERD 04/05/2010  . OTHER MALAISE AND FATIGUE 03/31/2010  . 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 Hypothyroid 11/18/2008  . ASTHMA 11/18/2008  . URINARY INCONTINENCE 11/18/2008  . NEPHROLITHIASIS, HX OF 11/18/2008    Past Medical History  Diagnosis Date  . H/O: rheumatic fever     Childhood  . Rheumatic mitral and aortic valve insufficiency 2005    a) s/p MV Ring repair; with [facility] for Afib; b) Echo 2013: EF 60-65%,no Regional WMA, Gr 2 DD, no Sig MR ;; c) TEE 10/07/2014: Severlely thickened and calcified MV leaflets. Posterior MV leaflet is fixed and immobile. MAC. reduced excursion of the anterior MV leaflet. Mean MV gradient was 51mm Hg and MVA  calculated at cm2.    Marland Kitchen CVA (cerebral infarction)     h/o superior cerebellar infarct  . Paroxysmal atrial fibrillation ([place]) 2005; Recurrent 09/2014    a) 2005: s/p [facility] & LAA ligation; b) 09/2014: TEE-cardioversion  . Atrial fibrillation and flutter ([place]) 09/2014    Revereted from Afib RVR to 2:1 A Flutter -- TEE/ DCCV 2/9; on Amiodarone  . 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 Chronic combined systolic and diastolic CHF, NYHA class 1 ([place]) 09/2014    Recent Exacerbation 09/2014 2/2 Afib/flutter with RVR - likely Tachycardia induced Cardiomyopathy; Echo 12/2014: EF 25-30%, no RWMA  . Digoxin toxicity 09/2014  . Acne rosacea   . Diverticulosis   . Osteopenia   . Anxiety   . Depression   . Urinary incontinence   . Asthma   . Hypothyroid     On replacement therapy; normal TSH 09/2014  . Migraine headache   . HYPERLIPIDEMIA 04/05/2010    Qualifier: Diagnosis of  By: Lorelei Pont MD, Frederico Hamman      Past Surgical History  Procedure Laterality Date  . Mitral valve repair  2005    with [facility] for [employer]  . Cardiac catheterization  Jan 2005    Pre-op R&LHC -- Nonobstructive CAD; normal PA pressures  . Cardioversion  06/04/2012    Procedure: CARDIOVERSION;  Surgeon: Carlena Bjornstad, MD;  Location: [facility] ENDOSCOPY;  Service: Cardiovascular;  Laterality: N/A;  . Right heart catheterization N/A 10/03/2014    Procedure: RIGHT HEART CATH;  Surgeon: Jolaine Artist, MD;  Location: [facility] CATH LAB;  Service: Cardiovascular;  RAP 15mmHg, RVP 35/2/9 mmHg, PAP 45/12 mmHg, PCWP 16 mmHg; CO/I by Fick: 3.9/2.3; Ao/PA/SVC SaO2%: 97%/63%/65%.  [place] Dates without cardioversion N/A 10/07/2014    Procedure: TRANSESOPHAGEAL ECHOCARDIOGRAM (TEE);  Surgeon: Sueanne Margarita, MD;  Location: [facility];  Service: Cardiovascular;  Laterality: N/A;  . Cardioversion N/A 10/07/2014    Procedure: CARDIOVERSION;  Surgeon: Sueanne Margarita, MD;  Location: [facility] ENDOSCOPY;  Service: Cardiovascular;  Laterality: N/A;  . Transthoracic echocardiogram  2013; 2/'16 & 5/'16    a) 2013: EF 60-65%, mild MR, Gr 2 DD; b) 2/'16 @ [facility]: EF ~10% - severel global LV dysfunction (systolic & diastolic) - dilated LV & restrictive filling pattern - Gr 3 DD,  mod reduced RV function, severely dilated LA - 6.4 cm, mod dilated RA, mod pl effusion, mod MR, mild TR; c) 5/10/'16:  EF 25-30%, mod LV dilation, no RWMA,Gr 3 DD w/ high LAP, MV sewing ring intact w/ Mod MR, Severe LA dilation  . Lumbar disc surgery      x 2 distantly  . Thyroidectomy, partial      partial-no cancer; now on thyroid replacement  . Abdominal hysterectomy  1980s    (no cancer),ovaries present  . Nm myoview ltd  4/7/'16    EF 37% with septal hypokinesis and diffuse hypokinesis. No ischemia or infarction. 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    Social History   Social History  . Marital Status: Married    Spouse Name: N/A  .  Number of Children: 2  . Years of Education: N/A   Occupational History  . Retired    Social History Main Topics  . Smoking status: Never Smoker   . Smokeless tobacco: Never Used  . Alcohol Use: No  . Drug Use: No  . Sexual Activity: Not on file   Other Topics Concern  . Not on file   Social History Narrative    Family History  Problem Relation Age of Onset  . Diabetes Mother   . Coronary artery disease Mother   . Kidney disease Mother   . Breast cancer Neg Hx     No Known Allergies  Medication list reviewed and updated in full in [facility].   GEN: No acute illnesses, no fevers, chills. GI: No n/v/d, eating normally Pulm: No SOB Interactive and getting along well at home.  Otherwise, ROS is as per the HPI.  Objective:   BP 120/60 mmHg  Pulse 47  Temp(Src) 98.6 F (37 C) (Oral)  Ht 5' 9.5" (1.765 m)  Wt 140 lb 8 oz (63.73 kg)  BMI 20.46 kg/m2  GEN: WDWN, NAD, Non-toxic, A & O x 3 HEENT: Atraumatic, Normocephalic. Neck supple. No masses, No LAD. Ears and Nose: No external deformity. CV: RRR, No M/G/R. No JVD. No thrill. No extra heart sounds. PULM: CTA B, no wheezes, crackles, rhonchi. No retractions. No resp. distress. No accessory muscle  use. EXTR: No c/c/e NEURO Normal gait.  PSYCH: Normally interactive. Conversant. Not depressed or anxious appearing.  Calm demeanor.   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   F/u labs, too  Follow-up: Return in about 6 months (around 02/24/2016) for wellness exam.  New Prescriptions   DONEPEZIL (ARICEPT) 5 MG TABLET    Take 1 tablet (5 mg total) by mouth at bedtime.   Modified Medications   No medications on file   Orders Placed This Encounter  Procedures  . Pneumococcal conjugate vaccine 13-valent IM  . CBC with Differential/Platelet  . TSH  . T3, free  . T4, free    Signed,  Oona Trammel T. Sadey Yandell, MD   Patient's Medications  New Prescriptions   DONEPEZIL (ARICEPT) 5 MG TABLET    Take 1 tablet (5 mg total) by mouth at bedtime.  Previous Medications   ACETAMINOPHEN (TYLENOL) 325 MG TABLET    Take 2 tablets (650 mg total) by mouth every 4 (four) hours as needed for mild pain, fever or headache.   AMIODARONE (PACERONE) 200 MG TABLET    Take 1 tablet (200 mg total) by mouth daily.   CARVEDILOL (COREG) 3.125 MG TABLET    TAKE ONE TABLET BY MOUTH TWICE DAILY   HYDROCODONE-ACETAMINOPHEN (NORCO/VICODIN) 5-325 MG PER TABLET    TAKE ONE TABLET BY MOUTH EVERY 4 TO 6 HOURS AS NEEDED FOR PAIN   LEVOTHYROXINE (SYNTHROID, LEVOTHROID) 25 MCG TABLET    TAKE TWO AND ONE-HALF TABLETS BY MOUTH DAILY BEFORE  BREAKFAST   LISINOPRIL (PRINIVIL,ZESTRIL) 5 MG TABLET  TAKE ONE TABLET BY MOUTH EVERY DAY   PHYTONADIONE (VITAMIN K) 5 MG TABLET    Take 1 tablet (5 mg total) by mouth once.   POTASSIUM CHLORIDE SA (K-DUR,KLOR-CON) 20 MEQ TABLET    Take 20 mEq by mouth daily.    WARFARIN (COUMADIN) 5 MG TABLET    Take 0.5-1 tablets (2.5-5 mg total) by mouth See admin instructions. Pt takes 2.5mg  Sunday, Wednesday and Friday - pt takes 5mg  on all other days at 1800  Please obtain repeat INR prior to continuing coumadin  Modified Medications   No medications on file  Discontinued Medications   No medications on file

## 2015-08-26 NOTE — Progress Notes (Signed)
Pre visit review using our clinic review tool, if applicable. No additional management support is needed unless otherwise documented below in the visit note. 

## 2015-08-30 HISTORY — PX: TRANSTHORACIC ECHOCARDIOGRAM: SHX275

## 2015-09-09 ENCOUNTER — Other Ambulatory Visit: Payer: Self-pay

## 2015-09-09 ENCOUNTER — Ambulatory Visit (INDEPENDENT_AMBULATORY_CARE_PROVIDER_SITE_OTHER): Payer: Medicare HMO

## 2015-09-09 DIAGNOSIS — Z7901 Long term (current) use of anticoagulants: Secondary | ICD-10-CM | POA: Diagnosis not present

## 2015-09-09 DIAGNOSIS — I480 Paroxysmal atrial fibrillation: Secondary | ICD-10-CM

## 2015-09-09 DIAGNOSIS — I5042 Chronic combined systolic (congestive) and diastolic (congestive) heart failure: Secondary | ICD-10-CM | POA: Diagnosis not present

## 2015-09-09 DIAGNOSIS — I43 Cardiomyopathy in diseases classified elsewhere: Secondary | ICD-10-CM | POA: Diagnosis not present

## 2015-09-09 DIAGNOSIS — R000 Tachycardia, unspecified: Secondary | ICD-10-CM | POA: Diagnosis not present

## 2015-09-09 DIAGNOSIS — I4891 Unspecified atrial fibrillation: Secondary | ICD-10-CM

## 2015-09-09 DIAGNOSIS — Z5181 Encounter for therapeutic drug level monitoring: Secondary | ICD-10-CM | POA: Diagnosis not present

## 2015-09-09 DIAGNOSIS — I059 Rheumatic mitral valve disease, unspecified: Secondary | ICD-10-CM

## 2015-09-09 DIAGNOSIS — I058 Other rheumatic mitral valve diseases: Secondary | ICD-10-CM

## 2015-09-09 DIAGNOSIS — I4892 Unspecified atrial flutter: Secondary | ICD-10-CM | POA: Diagnosis not present

## 2015-09-09 LAB — POCT INR: INR: 1.2

## 2015-09-16 ENCOUNTER — Ambulatory Visit (INDEPENDENT_AMBULATORY_CARE_PROVIDER_SITE_OTHER): Payer: Medicare HMO | Admitting: *Deleted

## 2015-09-16 DIAGNOSIS — I4892 Unspecified atrial flutter: Secondary | ICD-10-CM | POA: Diagnosis not present

## 2015-09-16 DIAGNOSIS — Z5181 Encounter for therapeutic drug level monitoring: Secondary | ICD-10-CM | POA: Diagnosis not present

## 2015-09-16 DIAGNOSIS — Z7901 Long term (current) use of anticoagulants: Secondary | ICD-10-CM

## 2015-09-16 DIAGNOSIS — I4891 Unspecified atrial fibrillation: Secondary | ICD-10-CM | POA: Diagnosis not present

## 2015-09-16 DIAGNOSIS — I480 Paroxysmal atrial fibrillation: Secondary | ICD-10-CM

## 2015-09-16 LAB — POCT INR: INR: 1.4

## 2015-09-22 ENCOUNTER — Other Ambulatory Visit: Payer: Self-pay | Admitting: Family Medicine

## 2015-09-23 ENCOUNTER — Ambulatory Visit (INDEPENDENT_AMBULATORY_CARE_PROVIDER_SITE_OTHER): Payer: Medicare HMO

## 2015-09-23 DIAGNOSIS — Z7901 Long term (current) use of anticoagulants: Secondary | ICD-10-CM

## 2015-09-23 DIAGNOSIS — Z5181 Encounter for therapeutic drug level monitoring: Secondary | ICD-10-CM

## 2015-09-23 DIAGNOSIS — I4891 Unspecified atrial fibrillation: Secondary | ICD-10-CM

## 2015-09-23 DIAGNOSIS — I480 Paroxysmal atrial fibrillation: Secondary | ICD-10-CM

## 2015-09-23 DIAGNOSIS — I4892 Unspecified atrial flutter: Secondary | ICD-10-CM

## 2015-09-23 LAB — POCT INR: INR: 1.4

## 2015-09-25 ENCOUNTER — Encounter: Payer: Self-pay | Admitting: Family Medicine

## 2015-09-25 ENCOUNTER — Ambulatory Visit (INDEPENDENT_AMBULATORY_CARE_PROVIDER_SITE_OTHER): Payer: Medicare HMO | Admitting: Family Medicine

## 2015-09-25 VITALS — BP 122/68 | HR 52 | Temp 97.8°F | Ht 69.5 in | Wt 142.1 lb

## 2015-09-25 DIAGNOSIS — B349 Viral infection, unspecified: Secondary | ICD-10-CM | POA: Diagnosis not present

## 2015-09-25 DIAGNOSIS — J988 Other specified respiratory disorders: Principal | ICD-10-CM

## 2015-09-25 DIAGNOSIS — B9789 Other viral agents as the cause of diseases classified elsewhere: Secondary | ICD-10-CM | POA: Insufficient documentation

## 2015-09-25 MED ORDER — HYDROCOD POLST-CPM POLST ER 10-8 MG/5ML PO SUER: 5.0000 mL | Freq: Two times a day (BID) | ORAL | Status: DC | PRN

## 2015-09-25 MED ORDER — PREDNISONE 50 MG PO TABS: ORAL_TABLET | ORAL | Status: DC

## 2015-09-25 NOTE — Progress Notes (Signed)
   Subjective:  Patient ID: [name], female    DOB: Sep 12, 1945  Age: 70 y.o. MRN: [id number]  CC: Cough, runny nose, sore throat  HPI:  70 year old female presents to clinic today with the above complaints.  Patient states that she developed the above symptoms yesterday after being around someone with a similar illness. She reports that she is experiencing severe cough and sore throat. She's also had runny nose and chest tightness. No associated fever or chills. She reports associated fatigue/malaise. No exacerbating or relieving factors. No interventions tried.  Social Hx   Social History   Social History  . Marital Status: Married    Spouse Name: N/A  . Number of Children: 2  . Years of Education: N/A   Occupational History  . Retired    Social History Main Topics  . Smoking status: Never Smoker   . Smokeless tobacco: Never Used  . Alcohol Use: No  . Drug Use: No  . Sexual Activity: Not Asked   Other Topics Concern  . None   Social History Narrative   Review of Systems  Constitutional: Negative for fever and chills.  HENT: Positive for rhinorrhea and sore throat.   Respiratory: Positive for cough and chest tightness.    Objective:  BP 122/68 mmHg  Pulse 52  Temp(Src) 97.8 F (36.6 C) (Oral)  Ht 5' 9.5" (1.765 m)  Wt 142 lb 2 oz (64.467 kg)  BMI 20.69 kg/m2  SpO2 99%  BP/Weight 09/25/2015 [id number]  Systolic BP 123XX123 [id number]  Diastolic BP 68 60 45  Wt. (Lbs) 142.13 140.5 -  BMI 20.69 20.46 -   Physical Exam  Constitutional: She appears well-developed. No distress.  HENT:  Head: Normocephalic and atraumatic.  Mouth/Throat: No oropharyngeal exudate.  Oropharynx with mild erythema. Normal TM's bilaterally.  Eyes: Conjunctivae are normal.  Cardiovascular: Normal rate and regular rhythm.   Pulmonary/Chest: Effort normal and breath sounds normal.  Neurological: She is alert.  Vitals reviewed.  Lab Results  Component Value Date   WBC 4.9  08/26/2015   HGB 12.6 08/26/2015   HCT 38.4 08/26/2015   PLT 209.0 08/26/2015   GLUCOSE 97 04/30/2015   CHOL 123 09/30/2014   TRIG 93 09/30/2014   HDL 53 09/30/2014   LDLDIRECT 132.3 03/31/2010   LDLCALC 51 09/30/2014   ALT 25 01/27/2015   AST 34 01/27/2015   NA 137 04/30/2015   K 4.0 04/30/2015   CL 105 04/30/2015   CREATININE 0.92 04/30/2015   BUN 11 04/30/2015   CO2 25 04/30/2015   TSH 4.45 08/26/2015   INR 1.4 09/23/2015   Assessment & Plan:   Problem List Items Addressed This Visit    Viral respiratory illness - Primary    New problem. Likely viral in origin and does not warrant antibiotic therapy. Given severity of cough, treating with prednisone and Tussionex.         Meds ordered this encounter  Medications  . predniSONE (DELTASONE) 50 MG tablet    Sig: 1 tablet daily x 5 days.    Dispense:  5 tablet    Refill:  0  . chlorpheniramine-HYDROcodone (TUSSIONEX PENNKINETIC ER) 10-8 MG/5ML SUER    Sig: Take 5 mLs by mouth every 12 (twelve) hours as needed.    Dispense:  115 mL    Refill:  0    Follow-up: PRN  [place]

## 2015-09-25 NOTE — Assessment & Plan Note (Signed)
New problem. Likely viral in origin and does not warrant antibiotic therapy. Given severity of cough, treating with prednisone and Tussionex.

## 2015-09-25 NOTE — Patient Instructions (Signed)
This is viral.  Use the cough medication (tussionex) as indicated.  Take the prednisone as prescribed.  These will help with your cough  Follow up with your primary regarding the skin lesions.  Take care  Dr. Lacinda Axon

## 2015-09-25 NOTE — Progress Notes (Signed)
Pre visit review using our clinic review tool, if applicable. No additional management support is needed unless otherwise documented below in the visit note. 

## 2015-09-30 ENCOUNTER — Ambulatory Visit (INDEPENDENT_AMBULATORY_CARE_PROVIDER_SITE_OTHER): Payer: Medicare HMO

## 2015-09-30 ENCOUNTER — Ambulatory Visit (INDEPENDENT_AMBULATORY_CARE_PROVIDER_SITE_OTHER): Payer: Medicare HMO | Admitting: Cardiology

## 2015-09-30 ENCOUNTER — Encounter: Payer: Self-pay | Admitting: Cardiology

## 2015-09-30 VITALS — BP 110/60 | HR 52 | Ht 69.0 in | Wt 144.8 lb

## 2015-09-30 DIAGNOSIS — I059 Rheumatic mitral valve disease, unspecified: Secondary | ICD-10-CM | POA: Diagnosis not present

## 2015-09-30 DIAGNOSIS — Z7901 Long term (current) use of anticoagulants: Secondary | ICD-10-CM

## 2015-09-30 DIAGNOSIS — I4891 Unspecified atrial fibrillation: Secondary | ICD-10-CM | POA: Diagnosis not present

## 2015-09-30 DIAGNOSIS — R000 Tachycardia, unspecified: Secondary | ICD-10-CM

## 2015-09-30 DIAGNOSIS — I480 Paroxysmal atrial fibrillation: Secondary | ICD-10-CM

## 2015-09-30 DIAGNOSIS — I5042 Chronic combined systolic (congestive) and diastolic (congestive) heart failure: Secondary | ICD-10-CM

## 2015-09-30 DIAGNOSIS — Z5181 Encounter for therapeutic drug level monitoring: Secondary | ICD-10-CM

## 2015-09-30 DIAGNOSIS — I4892 Unspecified atrial flutter: Secondary | ICD-10-CM

## 2015-09-30 DIAGNOSIS — I43 Cardiomyopathy in diseases classified elsewhere: Secondary | ICD-10-CM

## 2015-09-30 DIAGNOSIS — Z79899 Other long term (current) drug therapy: Secondary | ICD-10-CM

## 2015-09-30 DIAGNOSIS — I058 Other rheumatic mitral valve diseases: Secondary | ICD-10-CM

## 2015-09-30 DIAGNOSIS — E785 Hyperlipidemia, unspecified: Secondary | ICD-10-CM

## 2015-09-30 LAB — POCT INR: INR: 2.2

## 2015-09-30 NOTE — Progress Notes (Signed)
PCP: Owens Loffler, MD  Clinic Note: Chief Complaint  Patient presents with  . other    F/u echo no complaints. Meds reviewed verbally with pt.  . 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 - Valvular cardiomyopathy  . 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 female with a complicated cardiac history (see below) presents today for 3-4 month follow-up for her PAF, nonischemic cardiomyopathy and history of mitral valve replacement.  She was last seen by me in July 2016: Was in sinus rhythm on amiodarone. No active heart failure symptoms. Tolerating ACE inhibitor, but not on beta blocker due to amiodarone. Plan was to reassess echo in 2-3 months.  PRIOR CARDIAC HISTORY 2004-2013:  Rheumatic Mitral Valve Disease - MVP with Severe MR -- referred to Dr. 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 Feb 2005: s/p mitral valve repair with #34 mm Seguin annuloplasty ring, quadrangular resection of posterior mitral valve leaflets X2 with sliding leaflet annuloplasty, resection of fibrotic chordae tendineae with replacement with artificial Gore-Tex cords to anterior leaflet X2; Cox Maze and ligation of the left atrial appendage 2. Chronic AFib - s/p Cox Maze ablation  Echo 2012: EF 60-65%, Gr 2 DD, no significant MR 3. CVA - superior cerebellar 4. Seen by Cardiology in 2013 -- 2:1 Aflutter --> DCCV by Dr. Ron Parker 06/04/2012 5. Recurrence of A. fib with RVR - admitted to [facility] 09/30/2014 with acute combined systolic and diastolic heart failure in the setting of A. fib RVR, extremely volume overloaded.  2D Echo: (EF ~10% - severely decreased global LV  Systolic function, severely increased LV internal cavity size with restrictive filling pattern - Gr 3 DD, moderately reduced RV function, severely dilated LA - 6.4 cm, moderately dilated RA, moderate pleural effusion, mild and mass, moderate MR, mild TR).   She was aggressively diuresed with IV Lasix   Converted to Aflutter 2:1 rates ~120s  Transferred to [employer] (hypNatremic, AMS) -- Amiodarone gtt --> TEE/DCCV.  RHC: performed (see [place])  Seen by Dr. Caryl Comes on Jan 27, 2015: -- He basically discussed rate versus rhythm control. Discussed the effects of A. fib on cardiomyopathy either as a cause of or exacerbation of heart failure. Carvedilol 3.125 mg twice a day was started, with the suggestion of adding an aldosteronism antagonists versus titrating beta blocker/ACE inhibitor -->   Studies Reviewed: Updated in PMH/PSH  2-D echocardiogram 09/14/2015: EF improved to 45-50%. Mild diffuse HK. Mild MR. Moderate LA dilation. Normal PA pressures.  Interval History: She presents today doing quite well. She presents today without any major cardiac complaints. She isn't a good mood. Happy. Less anxious than usual. She is feeling "fine" denies any sensation of rapid irregular heartbeats. No recurrence of dyspnea with rest or exertion.  She is active in her day-to-day life, but no routine exercise. No heart failure symptoms -- no PND, orthopnea or even edema.  Cardiovascular ROS: no chest pain or dyspnea on exertion positive for - murmur negative for - edema, irregular heartbeat, loss of consciousness, orthopnea, palpitations, paroxysmal nocturnal dyspnea, rapid heart rate, shortness of breath or TIA/amaurosis fugax. Syncope/near syncope   Past  Medical History  Diagnosis Date  . H/O: rheumatic fever     Childhood  . Rheumatic mitral and aortic valve insufficiency 2005    a) s/p MV Ring repair; with Cox-Maze for Afib; b) Echo 2013: EF 60-65%,no Regional WMA, Gr 2 DD, no Sig MR ;; c) TEE 10/07/2014:  Severlely thickened and calcified MV leaflets. Posterior MV leaflet is fixed and immobile. MAC. reduced excursion of the anterior MV leaflet. Mean MV gradient was 10mm Hg and MVA calculated at cm2.    Marland Kitchen CVA (cerebral infarction)     h/o superior cerebellar infarct  . Paroxysmal atrial fibrillation ([place]) 2005; Recurrent 09/2014    a) 2005: s/p Cox Maze & LAA ligation; b) 09/2014: TEE-cardioversion  . Atrial fibrillation and flutter ([place]) 09/2014    Revereted from Afib RVR to 2:1 A Flutter -- TEE/ DCCV 2/9; on Amiodarone  . 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 Chronic combined systolic and diastolic CHF, NYHA class 1 (HCC) -- Systolic dysfunction 99991111 09/2014    Exacerbation 09/2014 2/2 Afib/flutter with RVR - 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 Mod LA dilation, normal PA pressures   . Digoxin toxicity 09/2014  . Acne rosacea   . Diverticulosis   . Osteopenia   . Anxiety   . Depression   . Urinary incontinence   . Asthma   . Hypothyroid     On replacement therapy; normal TSH 09/2014  . Migraine headache   . HYPERLIPIDEMIA 04/05/2010    Qualifier: Diagnosis of  By: Lorelei Pont MD, Frederico Hamman      Past Surgical History  Procedure Laterality Date  . Mitral valve repair  2005    with Cox Maze for [employer]  . Cardiac catheterization  Jan 2005    Pre-op R&LHC -- Nonobstructive CAD; normal PA pressures  . Cardioversion  06/04/2012    Procedure: CARDIOVERSION;  Surgeon: Carlena Bjornstad, MD;  Location: [facility] ENDOSCOPY;  Service: Cardiovascular;  Laterality: N/A;  . Right heart catheterization N/A 10/03/2014     Procedure: RIGHT HEART CATH;  Surgeon: Jolaine Artist, MD;  Location: [facility] CATH LAB;  Service: Cardiovascular;  RAP 76mmHg, RVP 35/2/9 mmHg, PAP 45/12 mmHg, PCWP 16 mmHg; CO/I by Fick: 3.9/2.3; Ao/PA/SVC SaO2%: 97%/63%/65%.  Darden Dates without cardioversion N/A 10/07/2014    Procedure: TRANSESOPHAGEAL ECHOCARDIOGRAM (TEE);  Surgeon: Sueanne Margarita, MD;  Location: [place];  Service: Cardiovascular;  Laterality: N/A;  . Cardioversion N/A 10/07/2014    Procedure: CARDIOVERSION;  Surgeon: Sueanne Margarita, MD;  Location: [facility] ENDOSCOPY;  Service: Cardiovascular;  Laterality: N/A;  . Transthoracic echocardiogram  2013; 2/'16 & 5/'16    a) 2013: EF 60-65%, mild MR, Gr 2 DD; b) 2/'16 @ [place]: EF ~10% - severel global LV dysfunction (systolic & diastolic) - dilated LV & restrictive filling pattern - Gr 3 DD, mod reduced RV function, severely dilated LA - 6.4 cm, mod dilated RA, mod pl effusion, mod MR, mild TR; c) 5/10/'16:  EF 25-30%, mod LV dilation, no RWMA,Gr 3 DD w/ high LAP, MV sewing ring intact w/  Mod MR, Severe LA dilation  . Lumbar disc surgery      x 2 distantly  . Thyroidectomy, partial      partial-no cancer; now on thyroid replacement  . Abdominal hysterectomy  1980s    (no cancer),ovaries present  . Nm myoview ltd  4/7/'16    EF 37% with septal hypokinesis and diffuse hypokinesis. No ischemia or infarction. 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  . Transthoracic echocardiogram  January 2017:    EF improved to 45-50%. Mild diffuse HK. Mild MR. Moderate LA dilation. Normal PA pressures.   No Known Allergies  Prior to Admission medications   Medication Sig Start Date End Date Taking? Authorizing Provider  acetaminophen (TYLENOL) 325 MG tablet Take 2 tablets (650 mg total) by mouth every 4 (four) hours as needed for mild pain, fever or headache. 04/30/15  Yes Velvet Bathe, MD  amiodarone (PACERONE) 200 MG tablet Take 1 tablet (200 mg total)  by mouth daily. 12/26/14  Yes Leonie Man, MD  carvedilol (COREG) 3.125 MG tablet TAKE ONE TABLET BY MOUTH TWICE DAILY 08/03/15  Yes Leonie Man, MD  chlorpheniramine-HYDROcodone [facility]) 10-8 MG/5ML SUER Take 5 mLs by mouth every 12 (twelve) hours as needed. 09/25/15  Yes Jayce G Cook, DO  donepezil (ARICEPT) 5 MG tablet Take 1 tablet (5 mg total) by mouth at bedtime. 08/26/15  Yes Spencer [name], MD  levothyroxine (SYNTHROID, LEVOTHROID) 25 MCG tablet TAKE TWO AND ONE-HALF TABLETS BY MOUTH DAILY BEFORE BREAKFAST 09/22/15  Yes Owens Loffler, MD  lisinopril (PRINIVIL,ZESTRIL) 5 MG tablet TAKE ONE TABLET BY MOUTH EVERY DAY 06/23/15  Yes Leonie Man, MD  phytonadione (VITAMIN K) 5 MG tablet Take 1 tablet (5 mg total) by mouth once. 05/06/15  Yes Spencer [name], MD  potassium chloride SA (K-DUR,KLOR-CON) 20 MEQ tablet Take 20 mEq by mouth daily.  08/25/15  Yes Historical Provider, MD  warfarin (COUMADIN) 5 MG tablet Take 0.5-1 tablets (2.5-5 mg total) by mouth See admin instructions. Pt takes 2.5mg  Sunday, Wednesday and Friday - pt takes 5mg  on all other days at 1800  Please obtain repeat INR prior to continuing coumadin 05/03/15  Yes Velvet Bathe, MD   Social and Family History:  reports that she has never smoked. She has never used smokeless tobacco. She reports that she does not drink alcohol or use illicit drugs. family history includes Coronary artery disease in her mother; Diabetes in her mother; Kidney disease in her mother. There is no history of Breast cancer.   ROS: A comprehensive was performed. Review of Systems  Constitutional: Negative for weight loss and malaise/fatigue.  HENT: Negative for nosebleeds.   Respiratory: Negative for cough and shortness of breath.   Cardiovascular: Negative for claudication.  Gastrointestinal: Negative for blood in stool and melena.  Genitourinary: Negative for hematuria.  Musculoskeletal: Negative for falls.  Neurological:  Negative for dizziness and focal weakness.  Endo/Heme/Allergies: Does not bruise/bleed easily.  Psychiatric/Behavioral: The patient is nervous/anxious.   All other systems reviewed and are negative.   Wt Readings from Last 3 Encounters:  09/30/15 144 lb 12 oz (65.658 kg)  09/25/15 142 lb 2 oz (64.467 kg)  08/26/15 140 lb 8 oz (63.73 kg)    PHYSICAL EXAM BP 110/60 mmHg  Pulse 52  Ht 5\' 9"  (1.753 m)  Wt 144 lb 12 oz (65.658 kg)  BMI 21.37 kg/m2 General appearance: alert, cooperative, appears stated age, no distress and Well  groomed, well nourished. Pleasant mood and affect. - Notably less anxious.  HEENT: [place]/AT, EOMI, MMM, anicteric sclera Neck: no adenopathy, no carotid bruit, no JVD, supple, symmetrical, trachea midline;Thyroidectomy scar Lungs: clear to auscultation bilaterally, normal percussion bilaterally and Nonlabored, good movement Heart: RRR (bradycardic), normal S1 and S2. Soft HSM at apex. No R./G. Nondisplaced PMI. Abdomen: soft, non-tender; bowel sounds normal; no masses, no organomegaly Extremities: extremities normal, atraumatic, no cyanosis or edema and no edema, redness or tenderness in the calves or thighs Pulses: 2+ and symmetric Skin: Skin color, texture, turgor normal. No rashes or lesions Neurologic: Grossly normal    Adult ECG Report  Rate: 54 ;  Rhythm: sinus bradycardia with 1 A-V block (PR interval ~194-206) and Nonspecific ST-T wave abnormalities in lateral leads. Normal axis    Narrative Interpretation: Stable EKG   Other studies Reviewed: Additional studies/ records that were reviewed today include: Echo & Myoview above Recent Labs:   Lab Results  Component Value Date   CREATININE 0.92 04/30/2015   This SmartLink has not been configured with any valid records.   TSH was 4.45 in December 2016  - plan to recheck in July   ASSESSMENT / PLAN: Problem List Items Addressed This Visit    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, status  post MVR (Chronic)    Most recent echo showed well-seated ring. Her echo was then followed up in January as post December. The valve seems to working relatively well.      PAF (paroxysmal atrial fibrillation) (HCC) (Chronic)    She seems to be maintaining sinus rhythm with amiodarone. No other good options. Anticoagulated with warfarin, followed by the Coumadin clinic nurse.      Relevant Orders   EKG 12-Lead (Completed)   On amiodarone therapy (Chronic)    With need to follow annual thyroid levels as well as LFTs.  She will need PFTs again this year.  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 No active anginal symptoms or heart failure symptoms. She is on low-dose carvedilol as well as lisinopril. No requirement for diuretic. If she did have some mild edema, would consider spironolactone.      Current use of long term anticoagulation (Chronic)    On warfarin plus amiodarone, therefore needs close monitoring of INR. Followed by Coumadin clinic.      Chronic combined systolic and diastolic CHF, NYHA class 1 (HCC) - Primary (Chronic)    Usually only associated with being in either A. fib or flutter with RVR. This tends to exacerbate her underlying valvular cardiomyopathy. Unfortunately with her current heart rate and blood pressure, we cannot further titrate carvedilol (in the setting amiodarone). Unable to titrate ACE inhibitor. With no sign of volume overload will not add spironolactone.  She has not had to use her as needed Lasix      Relevant Orders   EKG 12-Lead (Completed)      Current medicines are reviewed at length with the patient today. (+/- concerns) "do I need to take so many medications?" The following changes have been made:   NCREASE your lisinopril to 5mg  once per day. Plan will be to titrate further after blood  pressure check  Take morning dose of coreg after you eat breakfast  START lasix 20mg  once daily AS NEEDED for shortness of breath  Blood pressure check with INR check next week.  Follow-up in 6 months. Following Echocardiogram in December.   Leonie Man, M.D., M.S. Interventional Cardiologist   Pager # [phone]

## 2015-09-30 NOTE — Patient Instructions (Signed)
Medication Instructions:  Your physician recommends that you continue on your current medications as directed. Please refer to the Current Medication list given to you today.   Labwork: none  Testing/Procedures: none  Follow-Up: Your physician wants you to follow-up in: six months with Dr. Harding.  You will receive a reminder letter in the mail two months in advance. If you don't receive a letter, please call our office to schedule the follow-up appointment.   Any Other Special Instructions Will Be Listed Below (If Applicable).     If you need a refill on your cardiac medications before your next appointment, please call your pharmacy.   

## 2015-10-01 ENCOUNTER — Encounter: Payer: Self-pay | Admitting: Cardiology

## 2015-10-01 NOTE — Assessment & Plan Note (Signed)
She seems to be maintaining sinus rhythm with amiodarone. No other good options. Anticoagulated with warfarin, followed by the Coumadin clinic nurse.

## 2015-10-01 NOTE — Assessment & Plan Note (Signed)
On warfarin plus amiodarone, therefore needs close monitoring of INR. Followed by Coumadin clinic.

## 2015-10-01 NOTE — Assessment & Plan Note (Signed)
Most recent echo showed well-seated ring. Her echo was then followed up in January as post December. The valve seems to working relatively well.

## 2015-10-01 NOTE — Assessment & Plan Note (Signed)
Usually only associated with being in either A. fib or flutter with RVR. This tends to exacerbate her underlying valvular cardiomyopathy. Unfortunately with her current heart rate and blood pressure, we cannot further titrate carvedilol (in the setting amiodarone). Unable to titrate ACE inhibitor. With no sign of volume overload will not add spironolactone.  She has not had to use her as needed Lasix

## 2015-10-01 NOTE — Assessment & Plan Note (Signed)
With need to follow annual thyroid levels as well as LFTs.  She will need PFTs again this year.  Annual eye exam

## 2015-10-01 NOTE — Assessment & Plan Note (Signed)
Thankfully, EF is almost back to normal by recent echo. No active anginal symptoms or heart failure symptoms. She is on low-dose carvedilol as well as lisinopril. No requirement for diuretic. If she did have some mild edema, would consider spironolactone.

## 2015-10-14 ENCOUNTER — Ambulatory Visit (INDEPENDENT_AMBULATORY_CARE_PROVIDER_SITE_OTHER): Payer: Medicare HMO

## 2015-10-14 DIAGNOSIS — I4892 Unspecified atrial flutter: Secondary | ICD-10-CM

## 2015-10-14 DIAGNOSIS — Z7901 Long term (current) use of anticoagulants: Secondary | ICD-10-CM | POA: Diagnosis not present

## 2015-10-14 DIAGNOSIS — Z5181 Encounter for therapeutic drug level monitoring: Secondary | ICD-10-CM

## 2015-10-14 DIAGNOSIS — I480 Paroxysmal atrial fibrillation: Secondary | ICD-10-CM

## 2015-10-14 DIAGNOSIS — I4891 Unspecified atrial fibrillation: Secondary | ICD-10-CM | POA: Diagnosis not present

## 2015-10-14 LAB — POCT INR: INR: 2.3

## 2015-11-04 ENCOUNTER — Ambulatory Visit (INDEPENDENT_AMBULATORY_CARE_PROVIDER_SITE_OTHER): Payer: Medicare HMO

## 2015-11-04 DIAGNOSIS — I4892 Unspecified atrial flutter: Secondary | ICD-10-CM

## 2015-11-04 DIAGNOSIS — I4891 Unspecified atrial fibrillation: Secondary | ICD-10-CM | POA: Diagnosis not present

## 2015-11-04 DIAGNOSIS — Z7901 Long term (current) use of anticoagulants: Secondary | ICD-10-CM | POA: Diagnosis not present

## 2015-11-04 DIAGNOSIS — I480 Paroxysmal atrial fibrillation: Secondary | ICD-10-CM

## 2015-11-04 DIAGNOSIS — Z5181 Encounter for therapeutic drug level monitoring: Secondary | ICD-10-CM | POA: Diagnosis not present

## 2015-11-04 LAB — POCT INR: INR: 3

## 2015-11-16 ENCOUNTER — Other Ambulatory Visit: Payer: Self-pay | Admitting: Family Medicine

## 2015-11-17 ENCOUNTER — Telehealth: Payer: Self-pay

## 2015-11-17 NOTE — Telephone Encounter (Signed)
Mrs. [name] notified she should be taking the Levothyroxine 25 mcg 2 1/2 tablets daily, which she states is how she has been taking the levothyroxine.  Mrs. [name] states she found the 50 mcg bottle and didn't know if she was suppose to refill that.   I advised she is not to refill or take any of the 50 mg tablets that she found.  Patient states understanding.

## 2015-11-17 NOTE — Telephone Encounter (Signed)
Pt has been taking Levothyroxine 25 mcg taking 2 1/2 tabs before breakfast; pt found a bottle of levothyroxine 50 mcg with instructions taking one tab daily. Pt wants to know if she should be taking both meds. Advised pt until gets cb to take the Levothyroxine 25 mcg 2 1/2 tab before breakfast which is on pts current med list and do not take the Levothyroxine 50 mcg. Pt last seen 08/26/15.Please advise.

## 2015-11-18 NOTE — Telephone Encounter (Signed)
agree

## 2015-11-30 ENCOUNTER — Other Ambulatory Visit: Payer: Self-pay | Admitting: Family Medicine

## 2015-12-02 ENCOUNTER — Ambulatory Visit (INDEPENDENT_AMBULATORY_CARE_PROVIDER_SITE_OTHER): Payer: Medicare HMO

## 2015-12-02 DIAGNOSIS — Z7901 Long term (current) use of anticoagulants: Secondary | ICD-10-CM | POA: Diagnosis not present

## 2015-12-02 DIAGNOSIS — I4892 Unspecified atrial flutter: Secondary | ICD-10-CM | POA: Diagnosis not present

## 2015-12-02 DIAGNOSIS — I480 Paroxysmal atrial fibrillation: Secondary | ICD-10-CM

## 2015-12-02 DIAGNOSIS — Z5181 Encounter for therapeutic drug level monitoring: Secondary | ICD-10-CM

## 2015-12-02 DIAGNOSIS — I4891 Unspecified atrial fibrillation: Secondary | ICD-10-CM

## 2015-12-02 LAB — POCT INR: INR: 1.8

## 2015-12-16 ENCOUNTER — Other Ambulatory Visit: Payer: Self-pay | Admitting: Cardiology

## 2015-12-16 NOTE — Telephone Encounter (Signed)
Rx request sent to pharmacy.  

## 2015-12-16 NOTE — Telephone Encounter (Signed)
Pt called to ck on refill; pt will ck with pharmacy; pt said she called the wrong office.

## 2015-12-23 ENCOUNTER — Ambulatory Visit (INDEPENDENT_AMBULATORY_CARE_PROVIDER_SITE_OTHER): Payer: Medicare HMO

## 2015-12-23 DIAGNOSIS — Z7901 Long term (current) use of anticoagulants: Secondary | ICD-10-CM | POA: Diagnosis not present

## 2015-12-23 DIAGNOSIS — Z5181 Encounter for therapeutic drug level monitoring: Secondary | ICD-10-CM

## 2015-12-23 DIAGNOSIS — I4892 Unspecified atrial flutter: Secondary | ICD-10-CM

## 2015-12-23 DIAGNOSIS — I4891 Unspecified atrial fibrillation: Secondary | ICD-10-CM

## 2015-12-23 DIAGNOSIS — I480 Paroxysmal atrial fibrillation: Secondary | ICD-10-CM

## 2015-12-23 LAB — POCT INR: INR: 1.9

## 2016-01-13 ENCOUNTER — Ambulatory Visit (INDEPENDENT_AMBULATORY_CARE_PROVIDER_SITE_OTHER): Payer: Medicare HMO

## 2016-01-13 DIAGNOSIS — I480 Paroxysmal atrial fibrillation: Secondary | ICD-10-CM

## 2016-01-13 DIAGNOSIS — I4891 Unspecified atrial fibrillation: Secondary | ICD-10-CM

## 2016-01-13 DIAGNOSIS — I4892 Unspecified atrial flutter: Secondary | ICD-10-CM | POA: Diagnosis not present

## 2016-01-13 DIAGNOSIS — Z7901 Long term (current) use of anticoagulants: Secondary | ICD-10-CM

## 2016-01-13 DIAGNOSIS — Z5181 Encounter for therapeutic drug level monitoring: Secondary | ICD-10-CM | POA: Diagnosis not present

## 2016-01-13 LAB — POCT INR: INR: 1.8

## 2016-02-03 ENCOUNTER — Ambulatory Visit (INDEPENDENT_AMBULATORY_CARE_PROVIDER_SITE_OTHER): Payer: Medicare HMO

## 2016-02-03 DIAGNOSIS — I480 Paroxysmal atrial fibrillation: Secondary | ICD-10-CM

## 2016-02-03 DIAGNOSIS — I4892 Unspecified atrial flutter: Secondary | ICD-10-CM | POA: Diagnosis not present

## 2016-02-03 DIAGNOSIS — I4891 Unspecified atrial fibrillation: Secondary | ICD-10-CM

## 2016-02-03 DIAGNOSIS — Z7901 Long term (current) use of anticoagulants: Secondary | ICD-10-CM | POA: Diagnosis not present

## 2016-02-03 DIAGNOSIS — Z5181 Encounter for therapeutic drug level monitoring: Secondary | ICD-10-CM

## 2016-02-03 LAB — POCT INR: INR: 1.5

## 2016-02-08 ENCOUNTER — Other Ambulatory Visit: Payer: Self-pay | Admitting: Family Medicine

## 2016-02-08 ENCOUNTER — Other Ambulatory Visit: Payer: Self-pay | Admitting: Cardiology

## 2016-02-08 NOTE — Telephone Encounter (Signed)
Rx request sent to pharmacy.  

## 2016-02-09 IMAGING — MR MR HEAD W/O CM
8 of 10 series · 36 of 48 positions shown · non-contrast
Comparison: MRI 02/17/2004

CLINICAL DATA: Altered mental status

EXAM:
MRI HEAD WITHOUT CONTRAST
TECHNIQUE: Multiplanar, multiecho pulse sequences of the brain and surrounding
structures were obtained without intravenous contrast.

[Series 3: DWI · axial · 5.0mm · 1.09mm/px · z∈[-70,+111]mm · 8 of 67 slices shown (1 of 4)]
[im 1/67]
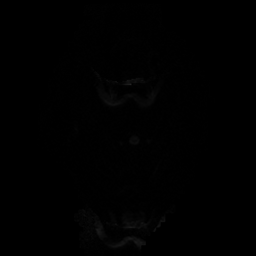
[im 10/67]
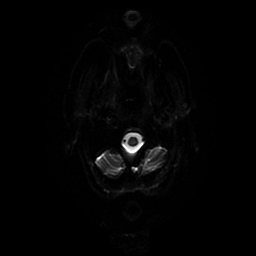
[im 19/67]
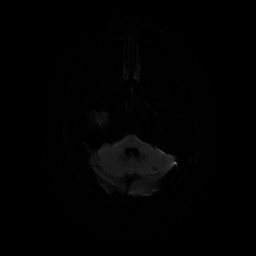
[im 29/67]
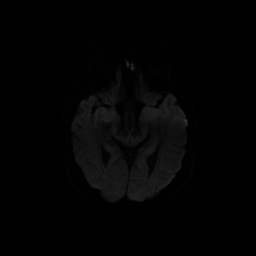
[im 38/67]
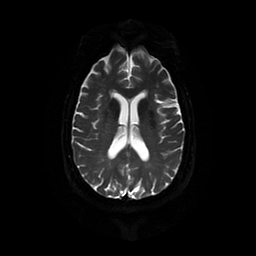
[im 48/67]
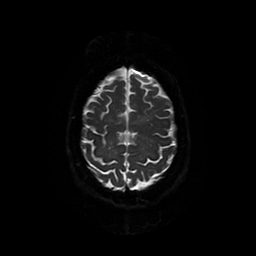
[im 57/67]
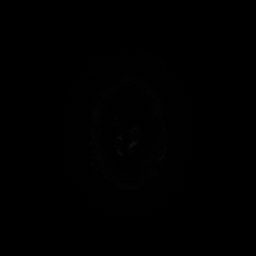
[im 67/67]
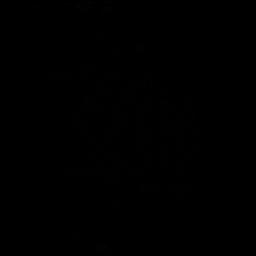

[Series 4: FLAIR · sagittal · 5.0mm · 0.47mm/px · 3 of 29 slices shown (1 of 2)]
[im 1/29]
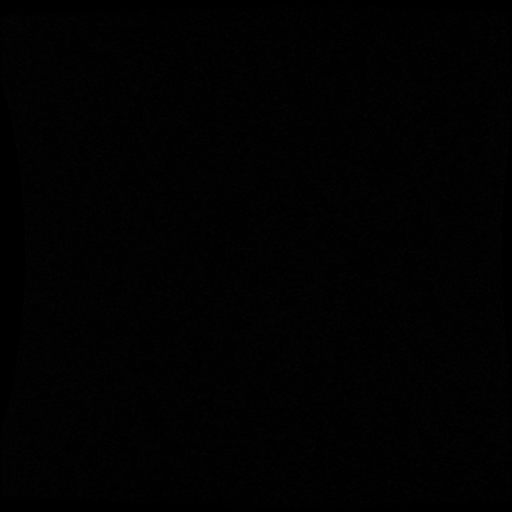
[im 15/29]
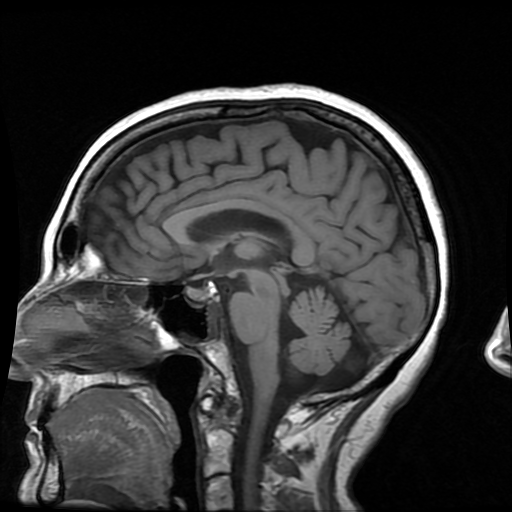
[im 29/29]
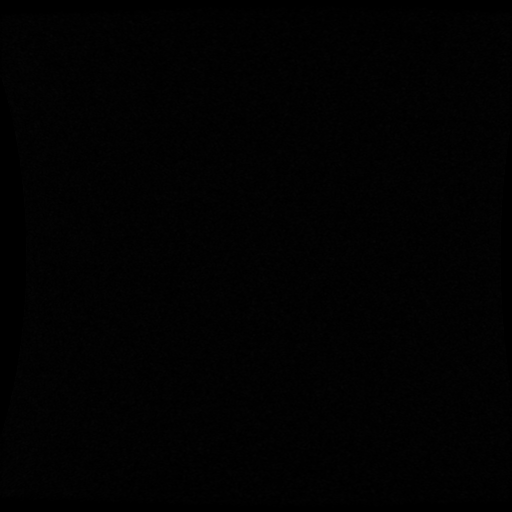

[Series 5: T2-star · axial · 5.0mm · 0.43mm/px · z∈[-58,+20]mm · 2 of 28 slices shown]
[im 1/28]
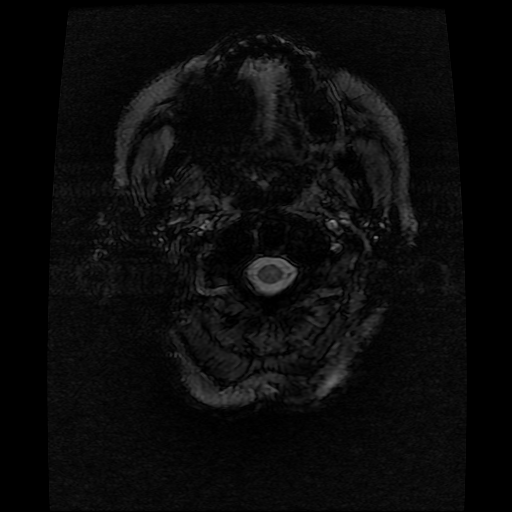
[im 14/28]
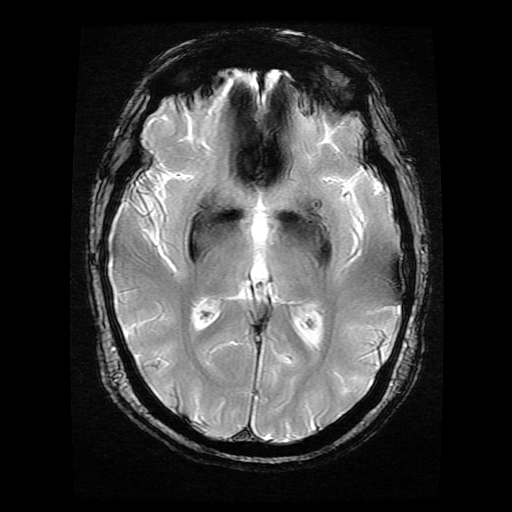

[Series 6: DWI · coronal · 5.0mm · 1.09mm/px · 9 of 86 slices shown (2 of 4)]
[im 1/86]
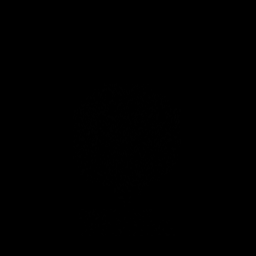
[im 11/86]
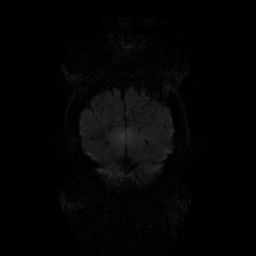
[im 22/86]
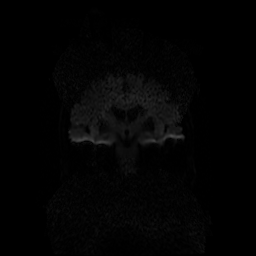
[im 32/86]
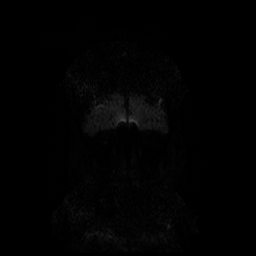
[im 43/86]
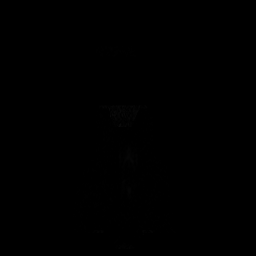
[im 54/86]
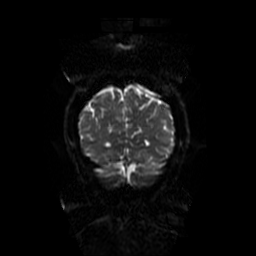
[im 64/86]
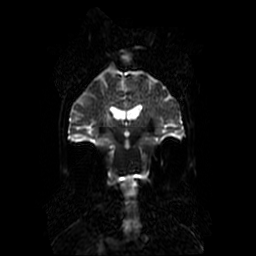
[im 75/86]
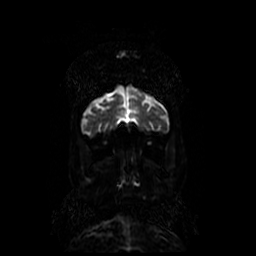
[im 86/86]
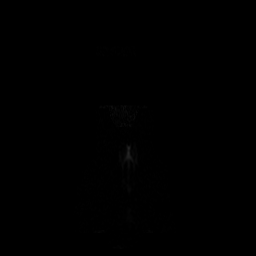

[Series 8: FLAIR · axial · 5.0mm · 0.43mm/px · z∈[-58,+103]mm · 3 of 28 slices shown (2 of 2)]
[im 1/28]
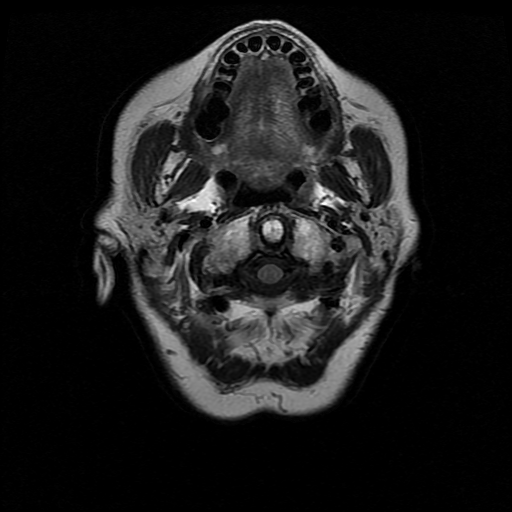
[im 14/28]
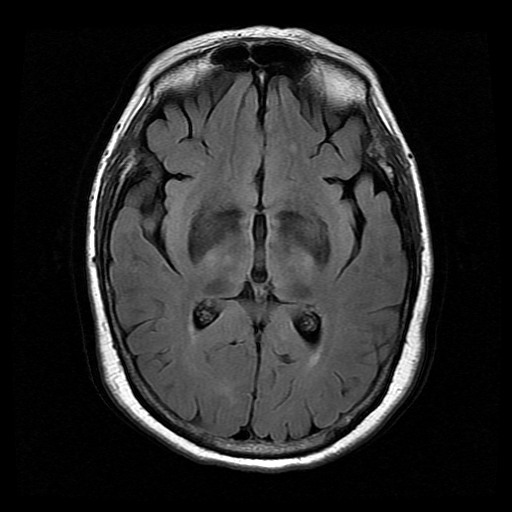
[im 28/28]
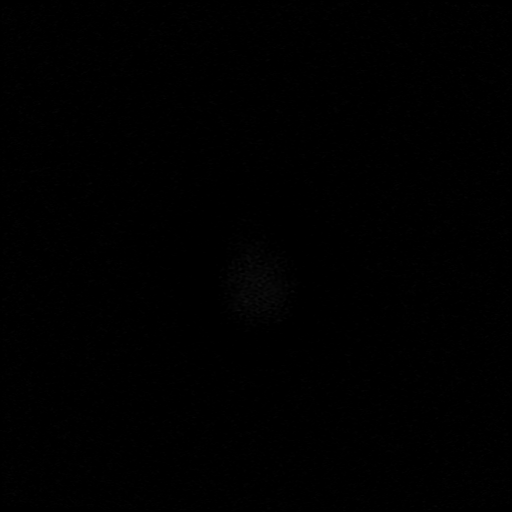

[Series 10: T2 · coronal · 5.0mm · 0.43mm/px · 4 of 34 slices shown]
[im 1/34]
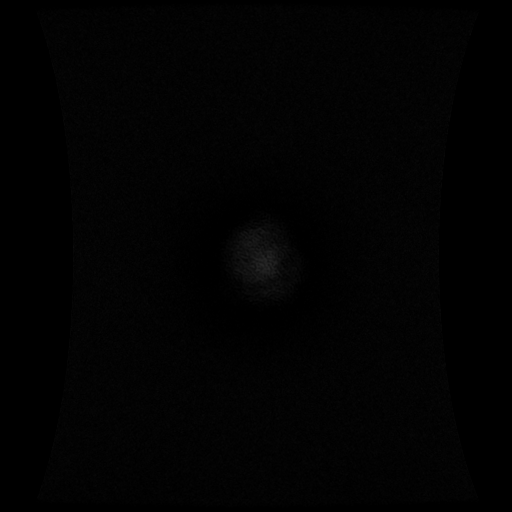
[im 12/34]
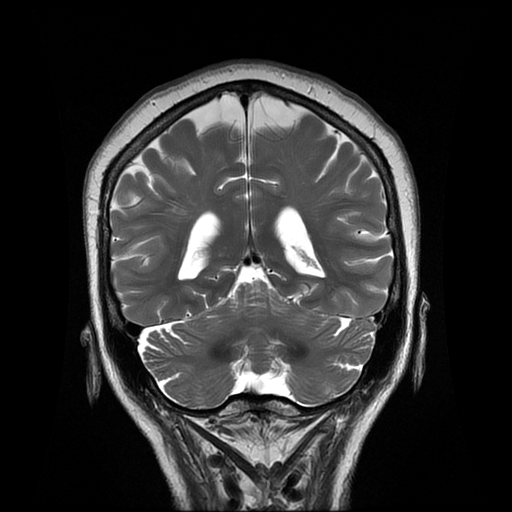
[im 23/34]
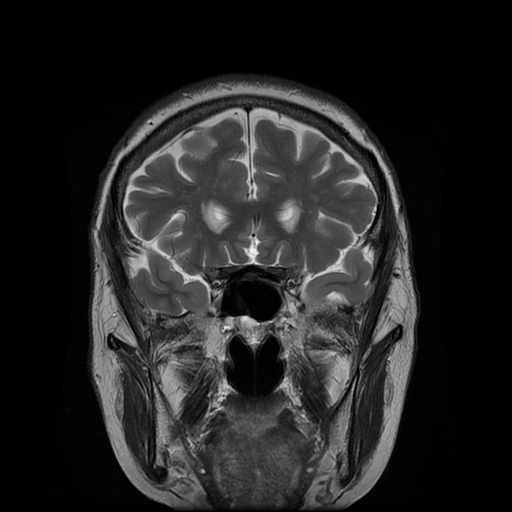
[im 34/34]
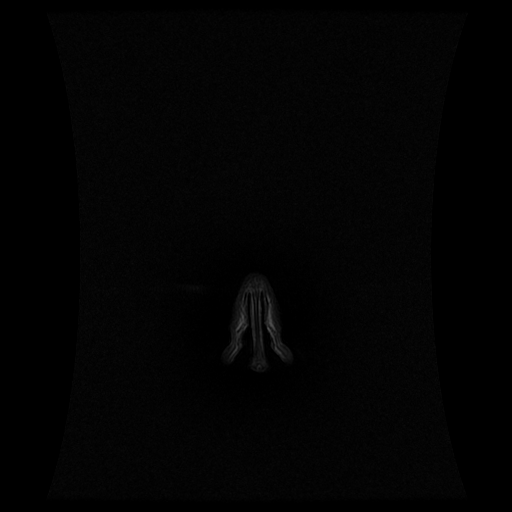

[Series 300: DWI · axial · 5.0mm · 1.09mm/px · z∈[-70,+95]mm · 3 of 31 slices shown (3 of 4)]
[im 1/31]
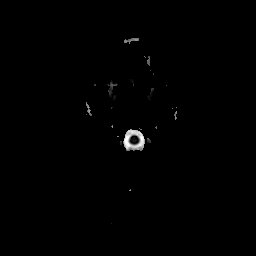
[im 16/31]
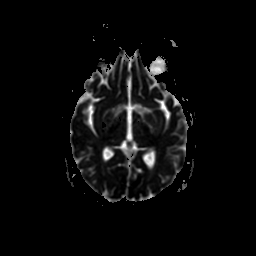
[im 31/31]
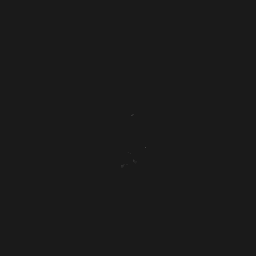

[Series 600: DWI · coronal · 5.0mm · 1.09mm/px · 4 of 41 slices shown (4 of 4)]
[im 1/41]
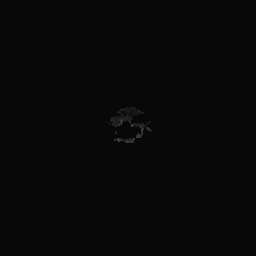
[im 14/41]
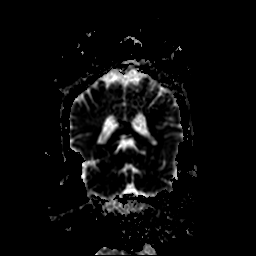
[im 27/41]
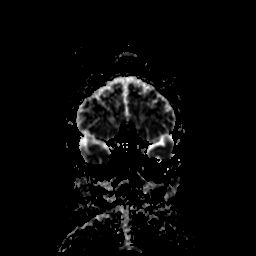
[im 41/41]
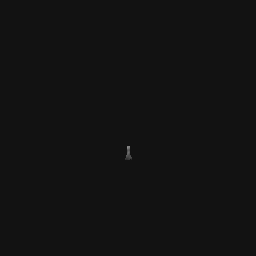

[36 of 48 positions shown; findings below may reference images not displayed]

FINDINGS: Negative for acute infarct.

Small chronic infarct left cerebellum unchanged from the prior
study. Mild chronic microvascular ischemic changes in the white
matter, with progression since 2339. Mild atrophy has progressed in
the interval.

Negative for intracranial hemorrhage. Negative for mass or edema. No
shift of the midline structures.

Mild mucosal thickening in the sphenoid sinus.
IMPRESSION: No acute intracranial abnormality.

Mild chronic sinusitis.

## 2016-02-11 DIAGNOSIS — M9902 Segmental and somatic dysfunction of thoracic region: Secondary | ICD-10-CM | POA: Diagnosis not present

## 2016-02-11 DIAGNOSIS — M5412 Radiculopathy, cervical region: Secondary | ICD-10-CM | POA: Diagnosis not present

## 2016-02-11 DIAGNOSIS — M9901 Segmental and somatic dysfunction of cervical region: Secondary | ICD-10-CM | POA: Diagnosis not present

## 2016-02-11 DIAGNOSIS — M62830 Muscle spasm of back: Secondary | ICD-10-CM | POA: Diagnosis not present

## 2016-02-12 DIAGNOSIS — M9901 Segmental and somatic dysfunction of cervical region: Secondary | ICD-10-CM | POA: Diagnosis not present

## 2016-02-12 DIAGNOSIS — M5412 Radiculopathy, cervical region: Secondary | ICD-10-CM | POA: Diagnosis not present

## 2016-02-12 DIAGNOSIS — M9902 Segmental and somatic dysfunction of thoracic region: Secondary | ICD-10-CM | POA: Diagnosis not present

## 2016-02-12 DIAGNOSIS — M62830 Muscle spasm of back: Secondary | ICD-10-CM | POA: Diagnosis not present

## 2016-02-16 DIAGNOSIS — M62830 Muscle spasm of back: Secondary | ICD-10-CM | POA: Diagnosis not present

## 2016-02-16 DIAGNOSIS — M5412 Radiculopathy, cervical region: Secondary | ICD-10-CM | POA: Diagnosis not present

## 2016-02-16 DIAGNOSIS — M9901 Segmental and somatic dysfunction of cervical region: Secondary | ICD-10-CM | POA: Diagnosis not present

## 2016-02-16 DIAGNOSIS — M9902 Segmental and somatic dysfunction of thoracic region: Secondary | ICD-10-CM | POA: Diagnosis not present

## 2016-02-17 ENCOUNTER — Ambulatory Visit (INDEPENDENT_AMBULATORY_CARE_PROVIDER_SITE_OTHER): Payer: Medicare HMO

## 2016-02-17 ENCOUNTER — Telehealth: Payer: Self-pay

## 2016-02-17 DIAGNOSIS — M9902 Segmental and somatic dysfunction of thoracic region: Secondary | ICD-10-CM | POA: Diagnosis not present

## 2016-02-17 DIAGNOSIS — M9901 Segmental and somatic dysfunction of cervical region: Secondary | ICD-10-CM | POA: Diagnosis not present

## 2016-02-17 DIAGNOSIS — I4892 Unspecified atrial flutter: Secondary | ICD-10-CM

## 2016-02-17 DIAGNOSIS — Z7901 Long term (current) use of anticoagulants: Secondary | ICD-10-CM

## 2016-02-17 DIAGNOSIS — I4891 Unspecified atrial fibrillation: Secondary | ICD-10-CM | POA: Diagnosis not present

## 2016-02-17 DIAGNOSIS — Z5181 Encounter for therapeutic drug level monitoring: Secondary | ICD-10-CM | POA: Diagnosis not present

## 2016-02-17 DIAGNOSIS — M5412 Radiculopathy, cervical region: Secondary | ICD-10-CM | POA: Diagnosis not present

## 2016-02-17 DIAGNOSIS — I480 Paroxysmal atrial fibrillation: Secondary | ICD-10-CM

## 2016-02-17 DIAGNOSIS — M62830 Muscle spasm of back: Secondary | ICD-10-CM | POA: Diagnosis not present

## 2016-02-17 LAB — POCT INR: INR: 3.3

## 2016-02-17 NOTE — Telephone Encounter (Signed)
Patient is on the list for [employer] 2017 and may be a good candidate for an AWV in 2017. Please let me know if/when appt is scheduled.   

## 2016-02-19 DIAGNOSIS — M5412 Radiculopathy, cervical region: Secondary | ICD-10-CM | POA: Diagnosis not present

## 2016-02-19 DIAGNOSIS — M62830 Muscle spasm of back: Secondary | ICD-10-CM | POA: Diagnosis not present

## 2016-02-19 DIAGNOSIS — M9902 Segmental and somatic dysfunction of thoracic region: Secondary | ICD-10-CM | POA: Diagnosis not present

## 2016-02-19 DIAGNOSIS — M9901 Segmental and somatic dysfunction of cervical region: Secondary | ICD-10-CM | POA: Diagnosis not present

## 2016-02-22 DIAGNOSIS — M9901 Segmental and somatic dysfunction of cervical region: Secondary | ICD-10-CM | POA: Diagnosis not present

## 2016-02-22 DIAGNOSIS — M5412 Radiculopathy, cervical region: Secondary | ICD-10-CM | POA: Diagnosis not present

## 2016-02-22 DIAGNOSIS — M9902 Segmental and somatic dysfunction of thoracic region: Secondary | ICD-10-CM | POA: Diagnosis not present

## 2016-02-22 DIAGNOSIS — M62830 Muscle spasm of back: Secondary | ICD-10-CM | POA: Diagnosis not present

## 2016-02-24 DIAGNOSIS — M62830 Muscle spasm of back: Secondary | ICD-10-CM | POA: Diagnosis not present

## 2016-02-24 DIAGNOSIS — M9901 Segmental and somatic dysfunction of cervical region: Secondary | ICD-10-CM | POA: Diagnosis not present

## 2016-02-24 DIAGNOSIS — M9902 Segmental and somatic dysfunction of thoracic region: Secondary | ICD-10-CM | POA: Diagnosis not present

## 2016-02-24 DIAGNOSIS — M5412 Radiculopathy, cervical region: Secondary | ICD-10-CM | POA: Diagnosis not present

## 2016-02-25 DIAGNOSIS — M9901 Segmental and somatic dysfunction of cervical region: Secondary | ICD-10-CM | POA: Diagnosis not present

## 2016-02-25 DIAGNOSIS — M5412 Radiculopathy, cervical region: Secondary | ICD-10-CM | POA: Diagnosis not present

## 2016-02-25 DIAGNOSIS — M62830 Muscle spasm of back: Secondary | ICD-10-CM | POA: Diagnosis not present

## 2016-02-25 DIAGNOSIS — M9902 Segmental and somatic dysfunction of thoracic region: Secondary | ICD-10-CM | POA: Diagnosis not present

## 2016-03-02 ENCOUNTER — Other Ambulatory Visit: Payer: Self-pay | Admitting: Family Medicine

## 2016-03-02 ENCOUNTER — Telehealth: Payer: Self-pay

## 2016-03-02 DIAGNOSIS — M62830 Muscle spasm of back: Secondary | ICD-10-CM | POA: Diagnosis not present

## 2016-03-02 DIAGNOSIS — M9902 Segmental and somatic dysfunction of thoracic region: Secondary | ICD-10-CM | POA: Diagnosis not present

## 2016-03-02 DIAGNOSIS — M5412 Radiculopathy, cervical region: Secondary | ICD-10-CM | POA: Diagnosis not present

## 2016-03-02 DIAGNOSIS — Z1231 Encounter for screening mammogram for malignant neoplasm of breast: Secondary | ICD-10-CM

## 2016-03-02 DIAGNOSIS — M9901 Segmental and somatic dysfunction of cervical region: Secondary | ICD-10-CM | POA: Diagnosis not present

## 2016-03-02 NOTE — Telephone Encounter (Signed)
Contacted pt to provide date and time of mammogram.

## 2016-03-02 NOTE — Telephone Encounter (Signed)
AWV, lab, and CPE appts made.

## 2016-03-02 NOTE — Telephone Encounter (Signed)
Contacted [facility] to schedule mammogram for pt. Mammogram scheduled for 04/18/16 @ 0900.

## 2016-03-03 DIAGNOSIS — M9902 Segmental and somatic dysfunction of thoracic region: Secondary | ICD-10-CM | POA: Diagnosis not present

## 2016-03-03 DIAGNOSIS — M9901 Segmental and somatic dysfunction of cervical region: Secondary | ICD-10-CM | POA: Diagnosis not present

## 2016-03-03 DIAGNOSIS — M62830 Muscle spasm of back: Secondary | ICD-10-CM | POA: Diagnosis not present

## 2016-03-03 DIAGNOSIS — M5412 Radiculopathy, cervical region: Secondary | ICD-10-CM | POA: Diagnosis not present

## 2016-03-04 ENCOUNTER — Ambulatory Visit: Payer: Medicare HMO

## 2016-03-04 ENCOUNTER — Other Ambulatory Visit: Payer: Medicare HMO

## 2016-03-04 ENCOUNTER — Ambulatory Visit (INDEPENDENT_AMBULATORY_CARE_PROVIDER_SITE_OTHER): Payer: Medicare HMO

## 2016-03-04 VITALS — BP 140/80 | HR 50 | Temp 98.5°F | Ht 67.75 in | Wt 143.0 lb

## 2016-03-04 DIAGNOSIS — M9901 Segmental and somatic dysfunction of cervical region: Secondary | ICD-10-CM | POA: Diagnosis not present

## 2016-03-04 DIAGNOSIS — Z1159 Encounter for screening for other viral diseases: Secondary | ICD-10-CM | POA: Diagnosis not present

## 2016-03-04 DIAGNOSIS — M5412 Radiculopathy, cervical region: Secondary | ICD-10-CM | POA: Diagnosis not present

## 2016-03-04 DIAGNOSIS — R718 Other abnormality of red blood cells: Secondary | ICD-10-CM

## 2016-03-04 DIAGNOSIS — E785 Hyperlipidemia, unspecified: Secondary | ICD-10-CM | POA: Diagnosis not present

## 2016-03-04 DIAGNOSIS — Z0000 Encounter for general adult medical examination without abnormal findings: Secondary | ICD-10-CM | POA: Diagnosis not present

## 2016-03-04 DIAGNOSIS — M62830 Muscle spasm of back: Secondary | ICD-10-CM | POA: Diagnosis not present

## 2016-03-04 DIAGNOSIS — M9902 Segmental and somatic dysfunction of thoracic region: Secondary | ICD-10-CM | POA: Diagnosis not present

## 2016-03-04 LAB — COMPREHENSIVE METABOLIC PANEL
ALBUMIN: 4.2 g/dL (ref 3.5–5.2)
ALK PHOS: 73 U/L (ref 39–117)
ALT: 20 U/L (ref 0–35)
AST: 21 U/L (ref 0–37)
BUN: 26 mg/dL — AB (ref 6–23)
CHLORIDE: 105 meq/L (ref 96–112)
CO2: 27 mEq/L (ref 19–32)
Calcium: 9.6 mg/dL (ref 8.4–10.5)
Creatinine, Ser: 0.97 mg/dL (ref 0.40–1.20)
GFR: 60.09 mL/min (ref >60.00)
Glucose, Bld: 105 mg/dL — ABNORMAL HIGH (ref 70–99)
Potassium: 5 mEq/L (ref 3.5–5.1)
SODIUM: 141 meq/L (ref 135–145)
TOTAL PROTEIN: 6.5 g/dL (ref 6.0–8.3)
Total Bilirubin: 0.6 mg/dL (ref 0.2–1.2)

## 2016-03-04 LAB — CBC WITH DIFFERENTIAL/PLATELET
BASOS ABS: 0 10*3/uL (ref 0.0–0.1)
Basophils Relative: 0.6 % (ref 0.0–3.0)
EOS ABS: 0.1 10*3/uL (ref 0.0–0.7)
Eosinophils Relative: 2.4 % (ref 0.0–5.0)
HCT: 37.3 % (ref 36.0–46.0)
HEMOGLOBIN: 12.6 g/dL (ref 12.0–15.0)
Lymphocytes Relative: 27.8 % (ref 12.0–46.0)
Lymphs Abs: 1.4 10*3/uL (ref 0.7–4.0)
MCHC: 33.7 g/dL (ref 30.0–36.0)
MCV: 98.8 fl (ref 78.0–100.0)
MONO ABS: 0.4 10*3/uL (ref 0.1–1.0)
Monocytes Relative: 8.1 % (ref 3.0–12.0)
Neutro Abs: 3 10*3/uL (ref 1.4–7.7)
Neutrophils Relative %: 61.1 % (ref 43.0–77.0)
Platelets: 210 10*3/uL (ref 150.0–400.0)
RBC: 3.77 Mil/uL — AB (ref 3.87–5.11)
RDW: 14.1 % (ref 11.5–15.5)
WBC: 5 10*3/uL (ref 4.0–10.5)

## 2016-03-04 NOTE — Progress Notes (Signed)
PCP notes:  Health maintenance:  Hep C screening - completed Tetanus - postponed/insurance Shingles - postponed/insurance  Abnormal screenings:   Fall risk - hx of fall with injury Cognitive - Mini-Cog score: 19/20  Patient concerns: None  Nurse concerns: None  Next PCP appt: 03/14/2016 @ 0900

## 2016-03-04 NOTE — Progress Notes (Signed)
Pre visit review using our clinic review tool, if applicable. No additional management support is needed unless otherwise documented below in the visit note. 

## 2016-03-04 NOTE — Progress Notes (Signed)
I reviewed health advisor's note, was available for consultation, and agree with documentation and plan.  Amy Bedsole, MD [facility] [place]  

## 2016-03-04 NOTE — Progress Notes (Signed)
**Note Deborah-Identified via Obfuscation** Subjective:   [name] is a 70 y.o. female who presents for Medicare Annual (Subsequent) preventive examination.  Review of Systems:  N/A Cardiac Risk Factors include: advanced age (>53men, >58 women);dyslipidemia     Objective:     Vitals: BP 140/80 mmHg  Pulse 50  Temp(Src) 98.5 F (36.9 C) (Oral)  Ht 5' 7.75" (1.721 m)  Wt 143 lb (64.864 kg)  BMI 21.90 kg/m2  SpO2 97%  Body mass index is 21.9 kg/(m^2).   Tobacco History  Smoking status  . Never Smoker   Smokeless tobacco  . Never Used     Counseling given: No   Past Medical History  Diagnosis Date  . H/O: rheumatic fever     Childhood  . Rheumatic mitral and aortic valve insufficiency 2005    a) s/p MV Ring repair; with [facility] for Afib; b) Echo 2013: EF 60-65%,no Regional WMA, Gr 2 DD, no Sig MR ;; c) TEE 10/07/2014: Severlely thickened and calcified MV leaflets. Posterior MV leaflet is fixed and immobile. MAC. reduced excursion of the anterior MV leaflet. Mean MV gradient was 21mm Hg and MVA calculated at cm2.    Marland Kitchen CVA (cerebral infarction)     h/o superior cerebellar infarct  . Paroxysmal atrial fibrillation ([place]) 2005; Recurrent 09/2014    a) 2005: s/p [facility] & LAA ligation; b) 09/2014: TEE-cardioversion  . Atrial fibrillation and flutter ([place]) 09/2014    Revereted from Afib RVR to 2:1 A Flutter -- TEE/ DCCV 2/9; on Amiodarone  . 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 Mod LA dilation, normal PA pressures   . Digoxin toxicity 09/2014  . Acne rosacea   . Diverticulosis   . Osteopenia   . Anxiety   . Depression   . Urinary incontinence   . Asthma   . Hypothyroid     On replacement therapy; normal TSH 09/2014  . Migraine headache   . HYPERLIPIDEMIA 04/05/2010    Qualifier: Diagnosis of  By: Lorelei Pont MD, Frederico Hamman     Past Surgical History  Procedure Laterality Date  . Mitral valve repair  2005    with [facility] for [employer]  . Cardiac catheterization  Jan 2005    Pre-op R&LHC -- Nonobstructive CAD; normal PA pressures  . Cardioversion  06/04/2012    Procedure: CARDIOVERSION;  Surgeon: Carlena Bjornstad, MD;  Location[facility] ENDOSCOPY;  Service: Cardiovascular;  Laterality: N/A;  . Right heart catheterization N/A 10/03/2014    Procedure: RIGHT HEART CATH;  Surgeon: Jolaine Artist, MD;  Location: [facility] CATH LAB;  Service: Cardiovascular;  RAP 52mmHg, RVP 35/2/9 mmHg, PAP 45/12 mmHg, PCWP 16 mmHg; CO/I by Fick: 3.9/2.3; Ao/PA/SVC SaO2%: 97%/63%/65%.  Darden Dates without cardioversion N/A 10/07/2014    Procedure: TRANSESOPHAGEAL ECHOCARDIOGRAM (TEE);  Surgeon: Sueanne Margarita, MD;  Location: [place];  Service: Cardiovascular;  Laterality: N/A;  .  Cardioversion N/A 10/07/2014    Procedure: CARDIOVERSION;  Surgeon: Sueanne Margarita, MD;  Location: [facility] ENDOSCOPY;  Service: Cardiovascular;  Laterality: N/A;  . Transthoracic echocardiogram  2013; 2/'16 & 5/'16    a) 2013: EF 60-65%, mild MR, Gr 2 DD; b) 2/'16 @ [place]: EF ~10% - severel global LV dysfunction (systolic & diastolic) - dilated LV & restrictive filling pattern - Gr 3 DD, mod reduced RV function, severely dilated LA - 6.4 cm, mod dilated RA, mod pl effusion, mod MR, mild TR; c) 5/10/'16:  EF 25-30%, mod LV dilation, no RWMA,Gr 3 DD w/ high LAP, MV sewing ring intact w/ Mod MR, Severe LA dilation  . Lumbar disc surgery      x 2 distantly  . Thyroidectomy, partial       partial-no cancer; now on thyroid replacement  . Abdominal hysterectomy  1980s    (no cancer),ovaries present  . Nm myoview ltd  4/7/'16    EF 37% with septal hypokinesis and diffuse hypokinesis. No ischemia or infarction. 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  . Transthoracic echocardiogram  January 2017:    EF improved to 45-50%. Mild diffuse HK. Mild MR. Moderate LA dilation. Normal PA pressures.   Family History  Problem Relation Age of Onset  . Diabetes Mother   . Coronary artery disease Mother   . Kidney disease Mother   . Breast cancer Neg Hx    History  Sexual Activity  . Sexual Activity: Yes    Outpatient Encounter Prescriptions as of 03/04/2016  Medication Sig  . acetaminophen (TYLENOL) 325 MG tablet Take 2 tablets (650 mg total) by mouth every 4 (four) hours as needed for mild pain, fever or headache.  Marland Kitchen amiodarone (PACERONE) 200 MG tablet TAKE ONE TABLET BY MOUTH EVERY DAY  . Calcium Citrate-Vitamin D (CALCIUM + D PO) Take 2 capsules by mouth daily.  . carvedilol (COREG) 3.125 MG tablet TAKE ONE TABLET BY MOUTH TWICE DAILY  . Cyanocobalamin (VITAMIN B-12 PO) Take 1 tablet by mouth daily.  Marland Kitchen donepezil (ARICEPT) 5 MG tablet TAKE ONE TABLET BY MOUTH AT BEDTIME  . IRON, FERROUS SULFATE, PO Take 1 tablet by mouth daily.  Marland Kitchen levothyroxine (SYNTHROID, LEVOTHROID) 25 MCG tablet TAKE TWO AND ONE-HALF TABLETS BY MOUTH DAILY BEFORE BREAKFAST  . lisinopril (PRINIVIL,ZESTRIL) 5 MG tablet TAKE ONE TABLET BY MOUTH EVERY DAY  . Omega-3 Fatty Acids (FISH OIL PO) Take 1 capsule by mouth daily.  . phytonadione (VITAMIN K) 5 MG tablet Take 1 tablet (5 mg total) by mouth once.  . potassium chloride SA (K-DUR,KLOR-CON) 20 MEQ tablet Take 20 mEq by mouth daily.   Marland Kitchen warfarin (COUMADIN) 5 MG tablet Take 0.5-1 tablets (2.5-5 mg total) by mouth See admin instructions. Pt takes 2.5mg  Sunday, Wednesday and Friday - pt takes 5mg  on all other  days at 1800  Please obtain repeat INR prior to continuing coumadin (Patient taking differently: Take 2.5-5 mg by mouth See admin instructions. Please take as prescribed by coumadin clinic  Please obtain repeat INR prior to continuing coumadin)  . chlorpheniramine-HYDROcodone (TUSSIONEX PENNKINETIC ER) 10-8 MG/5ML SUER Take 5 mLs by mouth every 12 (twelve) hours as needed. (Patient not taking: Reported on 03/04/2016)   No facility-administered encounter medications on file as of 03/04/2016.    Activities of Daily Living In your present state of health, do you have any difficulty performing the following activities: 03/04/2016  Hearing? Y  Vision? N  Difficulty concentrating or making decisions? N  Walking or climbing stairs? N  Dressing or bathing? N  Doing errands, shopping? N  Preparing Food and eating ? N  Using the Toilet? N  In the past six months, have you accidently leaked urine? Y  Do you have problems with loss of bowel control? N  Managing your Medications? N  Managing your Finances? N  Housekeeping or managing your Housekeeping? N    Patient Care Team: Owens Loffler, MD as PCP - General (Family Medicine)    Assessment:    Hearing Screening Comments: Wears bilateral hearing aids Vision Screening Comments: Last vision exam with Dr. Lovell Sheehan in 2016  Exercise Activities and Dietary recommendations Current Exercise Habits: Home exercise routine, Type of exercise: walking;stretching, Time (Minutes): 60, Frequency (Times/Week): 3, Weekly Exercise (Minutes/Week): 180, Intensity: Moderate, Exercise limited by: None identified  Goals    . Increase physical activity     Starting 03/04/2016, I will continue to walk at least 60 min 3 days per week.       Fall Risk Fall Risk  03/04/2016  Falls in the past year? Yes  Number falls in past yr: 1  Injury with Fall? Yes  Follow up Falls evaluation completed   Depression Screen PHQ 2/9 Scores 03/04/2016  PHQ - 2 Score 0      Cognitive Testing MMSE - Mini Mental State Exam 03/04/2016  Orientation to time 5  Orientation to Place 5  Registration 3  Attention/ Calculation 0  Recall 2  Recall-comments pt was unable to recall 1 of 3 words  Language- name 2 objects 0  Language- repeat 1  Language- follow 3 step command 3  Language- read & follow direction 0  Write a sentence 0  Copy design 0  Total score 19   PLEASE NOTE: A Mini-Cog screen was completed. Maximum score is 20. A value of 0 denotes this part of Folstein MMSE was not completed or the patient failed this part of the Mini-Cog screening.   Mini-Cog Screening Orientation to Time - Max 5 pts Orientation to Place - Max 5 pts Registration - Max 3 pts Recall - Max 3 pts Language Repeat - Max 1 pts Language Follow 3 Step Command - Max 3 pts  Immunization History  Administered Date(s) Administered  . Influenza Split 05/11/2011  . Influenza Whole 06/30/2010  . Influenza, High Dose Seasonal PF 06/27/2015  . Influenza,inj,Quad PF,36+ Mos 07/29/2013  . Pneumococcal Conjugate-13 08/26/2015  . Pneumococcal Polysaccharide-23 04/05/2010   Screening Tests Health Maintenance  Topic Date Due  . ZOSTAVAX  03/04/2017 (Originally 09/21/2004)  . TETANUS/TDAP  03/04/2017 (Originally 09/22/1964)  . INFLUENZA VACCINE  03/29/2016  . COLONOSCOPY  09/08/2016  . MAMMOGRAM  04/13/2017  . DEXA SCAN  Completed  . Hepatitis C Screening  Completed  . PNA vac Low Risk Adult  Completed      Plan:     I have personally reviewed and addressed the Medicare Annual Wellness questionnaire and have noted the following in the patient's chart:  A. Medical and social history B. Use of alcohol, tobacco or illicit drugs  C. Current medications and supplements D. Functional ability and status E.  Nutritional status F.  Physical activity G. Advance directives H. List of other physicians I.  Hospitalizations, surgeries, and ER visits in previous 12 months J.   [place] to include hearing, vision, cognitive, depression L. Referrals and appointments - none  In addition, I have reviewed and discussed with patient  certain preventive protocols, quality metrics, and best practice recommendations. A written personalized care plan for preventive services as well as general preventive health recommendations were provided to patient.  See attached scanned questionnaire for additional information.   Signed,   Lindell Noe, MHA, BS, LPN Health Advisor

## 2016-03-04 NOTE — Patient Instructions (Signed)
[name] , Thank you for taking time to come for your Medicare Wellness Visit. I appreciate your ongoing commitment to your health goals. Please review the following plan we discussed and let me know if I can assist you in the future.   These are the goals we discussed: Goals    . Increase physical activity     Starting 03/04/2016, I will continue to walk at least 60 min 3 days per week.        This is a list of the screening recommended for you and due dates:  Health Maintenance  Topic Date Due  . Shingles Vaccine  03/04/2017*  . Tetanus Vaccine  03/04/2017*  . Flu Shot  03/29/2016  . Colon Cancer Screening  09/08/2016  . Mammogram  04/13/2017  . DEXA scan (bone density measurement)  Completed  .  Hepatitis C: One time screening is recommended by Center for Disease Control  (CDC) for  adults born from [birth year] through 1966.   Completed  . Pneumonia vaccines  Completed  *Topic was postponed. The date shown is not the original due date.    Preventive Care for Adults  A healthy lifestyle and preventive care can promote health and wellness. Preventive health guidelines for adults include the following key practices.  . A routine yearly physical is a good way to check with your health care provider about your health and preventive screening. It is a chance to share any concerns and updates on your health and to receive a thorough exam.  . Visit your dentist for a routine exam and preventive care every 6 months. Brush your teeth twice a day and floss once a day. Good oral hygiene prevents tooth decay and gum disease.  . The frequency of eye exams is based on your age, health, family medical history, use  of contact lenses, and other factors. Follow your health care provider's ecommendations for frequency of eye exams.  . Eat a healthy diet. Foods like vegetables, fruits, whole grains, low-fat dairy products, and lean protein foods contain the nutrients you need without too many calories.  Decrease your intake of foods high in solid fats, added sugars, and salt. Eat the right amount of calories for you. Get information about a proper diet from your health care provider, if necessary.  . Regular physical exercise is one of the most important things you can do for your health. Most adults should get at least 150 minutes of moderate-intensity exercise (any activity that increases your heart rate and causes you to sweat) each week. In addition, most adults need muscle-strengthening exercises on 2 or more days a week.  Silver Sneakers may be a benefit available to you. To determine eligibility, you may visit the website: www.silversneakers.com or contact program at 726-647-4825 Mon-Fri between 8AM-8PM.   . Maintain a healthy weight. The body mass index (BMI) is a screening tool to identify possible weight problems. It provides an estimate of body fat based on height and weight. Your health care provider can find your BMI and can help you achieve or maintain a healthy weight.   For adults 20 years and older: ? A BMI below 18.5 is considered underweight. ? A BMI of 18.5 to 24.9 is normal. ? A BMI of 25 to 29.9 is considered overweight. ? A BMI of 30 and above is considered obese.   . Maintain normal blood lipids and cholesterol levels by exercising and minimizing your intake of saturated fat. Eat a balanced diet with plenty  of fruit and vegetables. Blood tests for lipids and cholesterol should begin at age 43 and be repeated every 5 years. If your lipid or cholesterol levels are high, you are over 50, or you are at high risk for heart disease, you may need your cholesterol levels checked more frequently. Ongoing high lipid and cholesterol levels should be treated with medicines if diet and exercise are not working.  . If you smoke, find out from your health care provider how to quit. If you do not use tobacco, please do not start.  . If you choose to drink alcohol, please do not consume  more than 2 drinks per day. One drink is considered to be 12 ounces (355 mL) of beer, 5 ounces (148 mL) of wine, or 1.5 ounces (44 mL) of liquor.  . If you are 32-47 years old, ask your health care provider if you should take aspirin to prevent strokes.  . Use sunscreen. Apply sunscreen liberally and repeatedly throughout the day. You should seek shade when your shadow is shorter than you. Protect yourself by wearing long sleeves, pants, a wide-brimmed hat, and sunglasses year round, whenever you are outdoors.  . Once a month, do a whole body skin exam, using a mirror to look at the skin on your back. Tell your health care provider of new moles, moles that have irregular borders, moles that are larger than a pencil eraser, or moles that have changed in shape or color.

## 2016-03-05 LAB — HEPATITIS C ANTIBODY: HCV AB: NEGATIVE

## 2016-03-07 ENCOUNTER — Other Ambulatory Visit (INDEPENDENT_AMBULATORY_CARE_PROVIDER_SITE_OTHER): Payer: Medicare HMO

## 2016-03-07 DIAGNOSIS — E785 Hyperlipidemia, unspecified: Secondary | ICD-10-CM | POA: Diagnosis not present

## 2016-03-07 LAB — LIPID PANEL
CHOLESTEROL: 190 mg/dL (ref 0–200)
HDL: 68.8 mg/dL (ref >39.00)
LDL Cholesterol: 85 mg/dL (ref 0–99)
NonHDL: 120.88
TRIGLYCERIDES: 179 mg/dL — AB (ref 0.0–149.0)
Total CHOL/HDL Ratio: 3
VLDL: 35.8 mg/dL (ref 0.0–40.0)

## 2016-03-08 DIAGNOSIS — M62830 Muscle spasm of back: Secondary | ICD-10-CM | POA: Diagnosis not present

## 2016-03-08 DIAGNOSIS — M5412 Radiculopathy, cervical region: Secondary | ICD-10-CM | POA: Diagnosis not present

## 2016-03-08 DIAGNOSIS — M9901 Segmental and somatic dysfunction of cervical region: Secondary | ICD-10-CM | POA: Diagnosis not present

## 2016-03-08 DIAGNOSIS — M9902 Segmental and somatic dysfunction of thoracic region: Secondary | ICD-10-CM | POA: Diagnosis not present

## 2016-03-09 ENCOUNTER — Ambulatory Visit (INDEPENDENT_AMBULATORY_CARE_PROVIDER_SITE_OTHER): Payer: Medicare HMO | Admitting: *Deleted

## 2016-03-09 DIAGNOSIS — Z5181 Encounter for therapeutic drug level monitoring: Secondary | ICD-10-CM | POA: Diagnosis not present

## 2016-03-09 DIAGNOSIS — I480 Paroxysmal atrial fibrillation: Secondary | ICD-10-CM

## 2016-03-09 DIAGNOSIS — Z7901 Long term (current) use of anticoagulants: Secondary | ICD-10-CM

## 2016-03-09 DIAGNOSIS — I4891 Unspecified atrial fibrillation: Secondary | ICD-10-CM

## 2016-03-09 DIAGNOSIS — I4892 Unspecified atrial flutter: Secondary | ICD-10-CM | POA: Diagnosis not present

## 2016-03-09 LAB — POCT INR: INR: 2

## 2016-03-14 ENCOUNTER — Encounter: Payer: Self-pay | Admitting: Family Medicine

## 2016-03-14 ENCOUNTER — Ambulatory Visit (INDEPENDENT_AMBULATORY_CARE_PROVIDER_SITE_OTHER): Payer: Medicare HMO | Admitting: Family Medicine

## 2016-03-14 VITALS — BP 121/63 | HR 46 | Temp 98.4°F | Ht 67.75 in | Wt 145.2 lb

## 2016-03-14 DIAGNOSIS — Z0000 Encounter for general adult medical examination without abnormal findings: Secondary | ICD-10-CM

## 2016-03-14 NOTE — Progress Notes (Signed)
Pre visit review using our clinic review tool, if applicable. No additional management support is needed unless otherwise documented below in the visit note. 

## 2016-03-14 NOTE — Progress Notes (Signed)
Dr. Frederico Hamman T. Nakayla Rorabaugh, MD, [facility] [place], [address] Phone: [phone] Fax: [phone]  03/14/2016  Patient: [name], MRN: [id number], DOB: 1946/07/27, 70 y.o.  Primary Physician:  Owens Loffler, MD   Chief Complaint  Patient presents with  . Medicare Wellness    Part 2   Subjective:   [name] is a 70 y.o. pleasant patient who presents with the following:  Health Maintenance Summary Reviewed and updated, unless pt declines services.  Tobacco History Reviewed. Non-smoker Alcohol: No concerns, no excessive use Exercise Habits: Some activity, rec at least 30 mins 5 times a week STD concerns: none Drug Use: None Birth control method: Menses regular: yes Lumps or breast concerns: no Breast Cancer Family History: no  L shoulder is hurting some - fell.  Health Maintenance  Topic Date Due  . ZOSTAVAX  03/04/2017 (Originally 09/21/2004)  . TETANUS/TDAP  03/04/2017 (Originally 09/22/1963)  . INFLUENZA VACCINE  03/29/2016  . COLONOSCOPY  09/08/2016  . MAMMOGRAM  04/13/2017  . DEXA SCAN  Completed  . Hepatitis C Screening  Completed  . PNA vac Low Risk Adult  Completed    Immunization History  Administered Date(s) Administered  . Influenza Split 05/11/2011  . Influenza Whole 06/30/2010  . Influenza, High Dose Seasonal PF 06/27/2015  . Influenza,inj,Quad PF,36+ Mos 07/29/2013  . Pneumococcal Conjugate-13 08/26/2015  . Pneumococcal Polysaccharide-23 04/05/2010   Patient Active Problem List   Diagnosis Date Noted  . Mild dementia 10/24/2014    Priority: High  . Chronic combined systolic and diastolic CHF, NYHA class 1 ([place]) 09/29/2014    Priority: High  . 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 Generalized anxiety disorder 11/18/2008   Priority: Medium  . Major depressive disorder, recurrent episode, moderate with anxious distress ([place]) 11/18/2008    Priority: Medium  . Viral respiratory illness 09/25/2015  . Fracture of left pelvis ([place]) 04/29/2015  . PAF (paroxysmal atrial fibrillation) ([place]) 03/04/2015  . Atrial flutter ([place]) 03/04/2015  . On amiodarone therapy 11/14/2014  . Current use of long term anticoagulation 11/14/2014  . Malnutrition of moderate degree ([place]) 10/06/2014  . Hyperlipidemia LDL goal <100 04/05/2010  . GERD 04/05/2010  . OTHER MALAISE AND FATIGUE 03/31/2010  . 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 Hypothyroid 11/18/2008  . ASTHMA 11/18/2008  . URINARY INCONTINENCE 11/18/2008  . NEPHROLITHIASIS, HX OF 11/18/2008   Past Medical History  Diagnosis Date  . H/O: rheumatic fever     Childhood  . Rheumatic mitral and aortic valve insufficiency 2005    a) s/p MV Ring repair; with Cox-Maze for Afib; b) Echo 2013: EF 60-65%,no Regional WMA, Gr 2 DD, no Sig MR ;; c) TEE 10/07/2014: Severlely thickened and calcified MV leaflets. Posterior MV leaflet is fixed and immobile. MAC. reduced excursion of the anterior MV leaflet. Mean MV gradient was 28m Hg and MVA calculated at cm2.    .Marland KitchenCVA (cerebral infarction)     h/o superior cerebellar infarct  . Paroxysmal atrial fibrillation ([facility] 2005; Recurrent 09/2014    a) 2005: s/p Cox Maze & LAA ligation; b) 09/2014: TEE-cardioversion  . Atrial fibrillation and flutter ([facility] 09/2014    Revereted from Afib RVR to 2:1 A Flutter -- TEE/ DCCV 2/9; on Amiodarone  . Dilated  cardiomyopathy secondary to tachycardia ([facility]) 09/2014    a) [facility] Echo: EF ~10% Severe LV dilation & global LV Systolic dysfxn, restrictive filling pattern - Gr 3 DD, mod RV dysfxn, severe LA dilation - 6.4 cm, mod RA dil, mod pl effusion, mod MR, mild TR; b) TEE 10/07/14: EF ~10%, Severe LV dilation & dysfxn Mod RV dysfxn, LAA oversewn, Mod RA & TR  . Chronic combined systolic  and diastolic CHF, NYHA class 1 ([facility]) -- Systolic dysfunction resolved[I50.42] 09/2014    Exacerbation 09/2014 2/2 Afib/flutter with RVR - 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 Mod LA dilation, normal PA pressures   . Digoxin toxicity 09/2014  . Acne rosacea   . Diverticulosis   . Osteopenia   . Anxiety   . Depression   . Urinary incontinence   . Asthma   . Hypothyroid     On replacement therapy; normal TSH 09/2014  . Migraine headache   . HYPERLIPIDEMIA 04/05/2010    Qualifier: Diagnosis of  By: Patsy Lager MD, Karleen Hampshire     Past Surgical History  Procedure Laterality Date  . Mitral valve repair  2005    with Cox Maze for [employer]  . Cardiac catheterization  Jan 2005    Pre-op R&LHC -- Nonobstructive CAD; normal PA pressures  . Cardioversion  06/04/2012    Procedure: CARDIOVERSION;  Surgeon: Luis Abed, MD;  Location: [facility] ENDOSCOPY;  Service: Cardiovascular;  Laterality: N/A;  . Right heart catheterization N/A 10/03/2014    Procedure: RIGHT HEART CATH;  Surgeon: Dolores Patty, MD;  Location: [facility] CATH LAB;  Service: Cardiovascular;  RAP , RVP 35/2/9 mmHg, PAP 45/12 mmHg, PCWP 16 mmHg; CO/I by Fick: 3.9/2.3; Ao/PA/SVC SaO2%: 97%/63%/65%.  Rhae Hammock without cardioversion N/A 10/07/2014    Procedure: TRANSESOPHAGEAL ECHOCARDIOGRAM (TEE);  Surgeon: Quintella Reichert, MD;  Location: [facility] ENDOSCOPY;  Service: Cardiovascular;  Laterality: N/A;  . Cardioversion N/A 10/07/2014    Procedure: CARDIOVERSION;  Surgeon: Quintella Reichert, MD;  Location: [facility] ENDOSCOPY;  Service: Cardiovascular;  Laterality: N/A;  . Transthoracic echocardiogram  2013; 2/'16 & 5/'16    a) 2013: EF 60-65%, mild MR, Gr 2 DD; b) 2/'16 @ [facility]: EF ~10% - severel global LV dysfunction (systolic & diastolic) - dilated LV & restrictive filling pattern - Gr 3 DD, mod reduced RV function, severely dilated LA - 6.4 cm, mod dilated RA, mod pl effusion, mod MR, mild TR; c)  5/10/'16:  EF 25-30%, mod LV dilation, no RWMA,Gr 3 DD w/ high LAP, MV sewing ring intact w/ Mod MR, Severe LA dilation  . Lumbar disc surgery      x 2 distantly  . Thyroidectomy, partial      partial-no cancer; now on thyroid replacement  . Abdominal hysterectomy  1980s    (no cancer),ovaries present  . Nm myoview ltd  4/7/'16    EF 37% with septal hypokinesis and diffuse hypokinesis. No ischemia or infarction. 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  . Transthoracic echocardiogram  January 2017:    EF improved to 45-50%. Mild diffuse HK. Mild MR. Moderate LA dilation. Normal PA pressures.   Social History   Social History  . Marital Status: Married    Spouse Name: N/A  . Number of Children: 2  . Years of Education: N/A   Occupational History  . Retired    Social History Main Topics  .  Smoking status: Never Smoker   . Smokeless tobacco: Never Used  . Alcohol Use: No  . Drug Use: No  . Sexual Activity: Yes   Other Topics Concern  . Not on file   Social History Narrative   Family History  Problem Relation Age of Onset  . Diabetes Mother   . Coronary artery disease Mother   . Kidney disease Mother   . Breast cancer Neg Hx    No Known Allergies  Medication list has been reviewed and updated.   General: Denies fever, chills, sweats. No significant weight loss. Eyes: Denies blurring,significant itching ENT: Denies earache, sore throat, and hoarseness.  Cardiovascular: Denies chest pains, palpitations, dyspnea on exertion,  Respiratory: Denies cough, dyspnea at rest,wheeezing Breast: no concerns about lumps GI: Denies nausea, vomiting, diarrhea, constipation, change in bowel habits, abdominal pain, melena, hematochezia GU: Denies dysuria, hematuria, urinary hesitancy, nocturia, denies STD risk, no concerns about discharge Musculoskeletal: Denies back pain, joint pain Derm: Denies rash, itching Neuro: Denies   paresthesias, frequent falls, frequent headaches Psych: Denies depression, anxiety Endocrine: Denies cold intolerance, heat intolerance, polydipsia Heme: Denies enlarged lymph nodes Allergy: No hayfever  Objective:   BP 121/63 mmHg  Pulse 46  Temp(Src) 98.4 F (36.9 C) (Oral)  Ht 5' 7.75" (1.721 m)  Wt 145 lb 4 oz (65.885 kg)  BMI 22.24 kg/m2 No exam data present  GEN: well developed, well nourished, no acute distress Eyes: conjunctiva and lids normal, PERRLA, EOMI ENT: TM clear, nares clear, oral exam WNL Neck: supple, no lymphadenopathy, no thyromegaly, no JVD Pulm: clear to auscultation and percussion, respiratory effort normal CV: regular rate and rhythm, S1-S2, no murmur, rub or gallop, no bruits Chest: no scars, masses, no lumps BREAST: breast exam declined GI: soft, non-tender; no hepatosplenomegaly, masses; active bowel sounds all quadrants GU: GU exam declined Lymph: no cervical, axillary or inguinal adenopathy MSK: gait normal, muscle tone and strength WNL, no joint swelling, effusions, discoloration, crepitus  SKIN: clear, good turgor, color WNL, no rashes, lesions, or ulcerations Neuro: normal mental status, normal strength, sensation, and motion Psych: alert; oriented to person, place and time, normally interactive and not anxious or depressed in appearance.   All labs reviewed with patient. Lipids:    Component Value Date/Time   CHOL 190 03/07/2016 1202   CHOL 123 09/30/2014 1650   TRIG 179.0* 03/07/2016 1202   TRIG 93 09/30/2014 1650   HDL 68.80 03/07/2016 1202   HDL 53 09/30/2014 1650   LDLDIRECT 132.3 03/31/2010 0906   VLDL 35.8 03/07/2016 1202   VLDL 19 09/30/2014 1650   CHOLHDL 3 03/07/2016 1202   CBC: CBC Latest Ref Rng 03/04/2016 08/26/2015 04/30/2015  WBC 4.0 - 10.5 K/uL 5.0 4.9 5.5  Hemoglobin 12.0 - 15.0 g/dL 12.6 12.6 11.7(L)  Hematocrit 36.0 - 46.0 % 37.3 38.4 34.8(L)  Platelets 150.0 - 400.0 K/uL 210.0 209.0 622    Basic Metabolic  Panel:    Component Value Date/Time   NA 141 03/04/2016 1426   NA 140 01/27/2015 1004   NA 138 10/03/2014 0255   K 5.0 03/04/2016 1426   K 4.2 10/03/2014 0255   CL 105 03/04/2016 1426   CL 105 10/03/2014 0255   CO2 27 03/04/2016 1426   CO2 26 10/03/2014 0255   BUN 26* 03/04/2016 1426   BUN 9 01/27/2015 1004   BUN 8 10/03/2014 0255   CREATININE 0.97 03/04/2016 1426   CREATININE 0.81 10/03/2014 0255   CREATININE 1.06 09/10/2012 1616  GLUCOSE 105* 03/04/2016 1426   GLUCOSE 82 01/27/2015 1004   GLUCOSE 65 10/03/2014 0255   CALCIUM 9.6 03/04/2016 1426   CALCIUM 7.8* 10/03/2014 0255   Hepatic Function Latest Ref Rng 03/04/2016 01/27/2015 10/06/2014  Total Protein 6.0 - 8.3 g/dL 6.5 7.0 5.0(L)  Albumin 3.5 - 5.2 g/dL 4.2 4.4 2.7(L)  AST 0 - 37 U/L 21 34 65(H)  ALT 0 - 35 U/L 20 25 125(H)  Alk Phosphatase 39 - 117 U/L 73 80 60  Total Bilirubin 0.2 - 1.2 mg/dL 0.6 0.6 0.5    Lab Results  Component Value Date   TSH 4.45 08/26/2015   No results found.  Assessment and Plan:   Healthcare maintenance  Health Maintenance Exam: The patient's preventative maintenance and recommended screening tests for an annual wellness exam were reviewed in full today. Brought up to date unless services declined.  Counselled on the importance of diet, exercise, and its role in overall health and mortality. The patient's FH and SH was reviewed, including their home life, tobacco status, and drug and alcohol status.  Doing really well Depression much better  Follow-up: 6 mo Or follow-up in 1 year for complete physical examination  Signed,  Drena Ham T. Quincie Haroon, MD   Patient's Medications  New Prescriptions   No medications on file  Previous Medications   ACETAMINOPHEN (TYLENOL) 325 MG TABLET    Take 2 tablets (650 mg total) by mouth every 4 (four) hours as needed for mild pain, fever or headache.   AMIODARONE (PACERONE) 200 MG TABLET    TAKE ONE TABLET BY MOUTH EVERY DAY   CALCIUM  CITRATE-VITAMIN D (CALCIUM + D PO)    Take 2 capsules by mouth daily.   CARVEDILOL (COREG) 3.125 MG TABLET    TAKE ONE TABLET BY MOUTH TWICE DAILY   CHLORPHENIRAMINE-HYDROCODONE (TUSSIONEX PENNKINETIC ER) 10-8 MG/5ML SUER    Take 5 mLs by mouth every 12 (twelve) hours as needed.   CYANOCOBALAMIN (VITAMIN B-12 PO)    Take 1 tablet by mouth daily.   DONEPEZIL (ARICEPT) 5 MG TABLET    TAKE ONE TABLET BY MOUTH AT BEDTIME   IRON, FERROUS SULFATE, PO    Take 1 tablet by mouth daily.   LEVOTHYROXINE (SYNTHROID, LEVOTHROID) 25 MCG TABLET    TAKE TWO AND ONE-HALF TABLETS BY MOUTH DAILY BEFORE BREAKFAST   LISINOPRIL (PRINIVIL,ZESTRIL) 5 MG TABLET    TAKE ONE TABLET BY MOUTH EVERY DAY   OMEGA-3 FATTY ACIDS (FISH OIL PO)    Take 1 capsule by mouth daily.   POTASSIUM CHLORIDE SA (K-DUR,KLOR-CON) 20 MEQ TABLET    Take 20 mEq by mouth daily.    WARFARIN (COUMADIN) 5 MG TABLET    Take 0.5-1 tablets (2.5-5 mg total) by mouth See admin instructions. Pt takes 2.'5mg'$  Sunday, Wednesday and Friday - pt takes '5mg'$  on all other days at 1800  Please obtain repeat INR prior to continuing coumadin  Modified Medications   No medications on file  Discontinued Medications   PHYTONADIONE (VITAMIN K) 5 MG TABLET    Take 1 tablet (5 mg total) by mouth once.

## 2016-03-15 DIAGNOSIS — M9902 Segmental and somatic dysfunction of thoracic region: Secondary | ICD-10-CM | POA: Diagnosis not present

## 2016-03-15 DIAGNOSIS — M5412 Radiculopathy, cervical region: Secondary | ICD-10-CM | POA: Diagnosis not present

## 2016-03-15 DIAGNOSIS — M9901 Segmental and somatic dysfunction of cervical region: Secondary | ICD-10-CM | POA: Diagnosis not present

## 2016-03-15 DIAGNOSIS — M62830 Muscle spasm of back: Secondary | ICD-10-CM | POA: Diagnosis not present

## 2016-03-17 DIAGNOSIS — M9901 Segmental and somatic dysfunction of cervical region: Secondary | ICD-10-CM | POA: Diagnosis not present

## 2016-03-17 DIAGNOSIS — M5412 Radiculopathy, cervical region: Secondary | ICD-10-CM | POA: Diagnosis not present

## 2016-03-17 DIAGNOSIS — M62830 Muscle spasm of back: Secondary | ICD-10-CM | POA: Diagnosis not present

## 2016-03-17 DIAGNOSIS — M9902 Segmental and somatic dysfunction of thoracic region: Secondary | ICD-10-CM | POA: Diagnosis not present

## 2016-03-25 ENCOUNTER — Other Ambulatory Visit: Payer: Self-pay | Admitting: Pharmacist

## 2016-03-25 MED ORDER — WARFARIN SODIUM 5 MG PO TABS: ORAL_TABLET | ORAL | 0 refills | Status: DC

## 2016-03-29 DIAGNOSIS — M9901 Segmental and somatic dysfunction of cervical region: Secondary | ICD-10-CM | POA: Diagnosis not present

## 2016-03-29 DIAGNOSIS — M5412 Radiculopathy, cervical region: Secondary | ICD-10-CM | POA: Diagnosis not present

## 2016-03-29 DIAGNOSIS — M62830 Muscle spasm of back: Secondary | ICD-10-CM | POA: Diagnosis not present

## 2016-03-29 DIAGNOSIS — M9902 Segmental and somatic dysfunction of thoracic region: Secondary | ICD-10-CM | POA: Diagnosis not present

## 2016-03-30 ENCOUNTER — Ambulatory Visit (INDEPENDENT_AMBULATORY_CARE_PROVIDER_SITE_OTHER): Payer: Medicare HMO

## 2016-03-30 DIAGNOSIS — I4892 Unspecified atrial flutter: Secondary | ICD-10-CM | POA: Diagnosis not present

## 2016-03-30 DIAGNOSIS — Z7901 Long term (current) use of anticoagulants: Secondary | ICD-10-CM

## 2016-03-30 DIAGNOSIS — I4891 Unspecified atrial fibrillation: Secondary | ICD-10-CM

## 2016-03-30 DIAGNOSIS — Z5181 Encounter for therapeutic drug level monitoring: Secondary | ICD-10-CM

## 2016-03-30 DIAGNOSIS — I480 Paroxysmal atrial fibrillation: Secondary | ICD-10-CM

## 2016-03-30 LAB — POCT INR: INR: 1.8

## 2016-04-05 DIAGNOSIS — M9902 Segmental and somatic dysfunction of thoracic region: Secondary | ICD-10-CM | POA: Diagnosis not present

## 2016-04-05 DIAGNOSIS — M62830 Muscle spasm of back: Secondary | ICD-10-CM | POA: Diagnosis not present

## 2016-04-05 DIAGNOSIS — M9901 Segmental and somatic dysfunction of cervical region: Secondary | ICD-10-CM | POA: Diagnosis not present

## 2016-04-05 DIAGNOSIS — M5412 Radiculopathy, cervical region: Secondary | ICD-10-CM | POA: Diagnosis not present

## 2016-04-05 NOTE — Telephone Encounter (Signed)
error 

## 2016-04-18 ENCOUNTER — Ambulatory Visit
Admission: RE | Admit: 2016-04-18 | Discharge: 2016-04-18 | Disposition: A | Discharge status: Home / Self Care | Payer: Medicare HMO | Source: Ambulatory Visit | Attending: Family Medicine | Admitting: Family Medicine

## 2016-04-18 ENCOUNTER — Other Ambulatory Visit: Payer: Self-pay | Admitting: Family Medicine

## 2016-04-18 DIAGNOSIS — Z1231 Encounter for screening mammogram for malignant neoplasm of breast: Secondary | ICD-10-CM

## 2016-04-20 ENCOUNTER — Ambulatory Visit (INDEPENDENT_AMBULATORY_CARE_PROVIDER_SITE_OTHER): Payer: Medicare HMO

## 2016-04-20 DIAGNOSIS — Z5181 Encounter for therapeutic drug level monitoring: Secondary | ICD-10-CM | POA: Diagnosis not present

## 2016-04-20 DIAGNOSIS — Z7901 Long term (current) use of anticoagulants: Secondary | ICD-10-CM

## 2016-04-20 DIAGNOSIS — I4891 Unspecified atrial fibrillation: Secondary | ICD-10-CM | POA: Diagnosis not present

## 2016-04-20 DIAGNOSIS — I4892 Unspecified atrial flutter: Secondary | ICD-10-CM

## 2016-04-20 DIAGNOSIS — I480 Paroxysmal atrial fibrillation: Secondary | ICD-10-CM

## 2016-04-20 LAB — POCT INR: INR: 1.3

## 2016-04-27 ENCOUNTER — Ambulatory Visit (INDEPENDENT_AMBULATORY_CARE_PROVIDER_SITE_OTHER): Payer: Medicare HMO | Admitting: *Deleted

## 2016-04-27 DIAGNOSIS — I480 Paroxysmal atrial fibrillation: Secondary | ICD-10-CM

## 2016-04-27 DIAGNOSIS — I4892 Unspecified atrial flutter: Secondary | ICD-10-CM | POA: Diagnosis not present

## 2016-04-27 DIAGNOSIS — Z7901 Long term (current) use of anticoagulants: Secondary | ICD-10-CM | POA: Diagnosis not present

## 2016-04-27 DIAGNOSIS — Z5181 Encounter for therapeutic drug level monitoring: Secondary | ICD-10-CM

## 2016-04-27 DIAGNOSIS — I4891 Unspecified atrial fibrillation: Secondary | ICD-10-CM | POA: Diagnosis not present

## 2016-04-27 LAB — POCT INR: INR: 1.9

## 2016-05-04 ENCOUNTER — Ambulatory Visit (INDEPENDENT_AMBULATORY_CARE_PROVIDER_SITE_OTHER): Payer: Medicare HMO

## 2016-05-04 DIAGNOSIS — Z7901 Long term (current) use of anticoagulants: Secondary | ICD-10-CM

## 2016-05-04 DIAGNOSIS — I4891 Unspecified atrial fibrillation: Secondary | ICD-10-CM

## 2016-05-04 DIAGNOSIS — I4892 Unspecified atrial flutter: Secondary | ICD-10-CM

## 2016-05-04 DIAGNOSIS — Z5181 Encounter for therapeutic drug level monitoring: Secondary | ICD-10-CM | POA: Diagnosis not present

## 2016-05-04 DIAGNOSIS — I480 Paroxysmal atrial fibrillation: Secondary | ICD-10-CM

## 2016-05-04 LAB — POCT INR: INR: 2.1

## 2016-05-09 ENCOUNTER — Other Ambulatory Visit: Payer: Self-pay

## 2016-05-09 MED ORDER — WARFARIN SODIUM 5 MG PO TABS: ORAL_TABLET | ORAL | 1 refills | Status: DC

## 2016-05-18 ENCOUNTER — Ambulatory Visit (INDEPENDENT_AMBULATORY_CARE_PROVIDER_SITE_OTHER): Payer: Medicare HMO | Admitting: *Deleted

## 2016-05-18 DIAGNOSIS — I480 Paroxysmal atrial fibrillation: Secondary | ICD-10-CM

## 2016-05-18 DIAGNOSIS — Z5181 Encounter for therapeutic drug level monitoring: Secondary | ICD-10-CM | POA: Diagnosis not present

## 2016-05-18 DIAGNOSIS — I4892 Unspecified atrial flutter: Secondary | ICD-10-CM | POA: Diagnosis not present

## 2016-05-18 DIAGNOSIS — I4891 Unspecified atrial fibrillation: Secondary | ICD-10-CM

## 2016-05-18 DIAGNOSIS — Z7901 Long term (current) use of anticoagulants: Secondary | ICD-10-CM

## 2016-05-18 LAB — POCT INR: INR: 2.4

## 2016-05-27 ENCOUNTER — Ambulatory Visit (INDEPENDENT_AMBULATORY_CARE_PROVIDER_SITE_OTHER): Payer: Medicare HMO

## 2016-05-27 DIAGNOSIS — Z23 Encounter for immunization: Secondary | ICD-10-CM

## 2016-06-07 ENCOUNTER — Other Ambulatory Visit: Payer: Self-pay | Admitting: Cardiology

## 2016-06-08 ENCOUNTER — Ambulatory Visit (INDEPENDENT_AMBULATORY_CARE_PROVIDER_SITE_OTHER): Payer: Medicare HMO

## 2016-06-08 DIAGNOSIS — I4892 Unspecified atrial flutter: Secondary | ICD-10-CM

## 2016-06-08 DIAGNOSIS — Z5181 Encounter for therapeutic drug level monitoring: Secondary | ICD-10-CM | POA: Diagnosis not present

## 2016-06-08 DIAGNOSIS — I4891 Unspecified atrial fibrillation: Secondary | ICD-10-CM | POA: Diagnosis not present

## 2016-06-08 DIAGNOSIS — Z7901 Long term (current) use of anticoagulants: Secondary | ICD-10-CM | POA: Diagnosis not present

## 2016-06-08 DIAGNOSIS — I480 Paroxysmal atrial fibrillation: Secondary | ICD-10-CM

## 2016-06-08 LAB — POCT INR: INR: 1.7

## 2016-06-22 ENCOUNTER — Ambulatory Visit (INDEPENDENT_AMBULATORY_CARE_PROVIDER_SITE_OTHER): Payer: Medicare HMO

## 2016-06-22 DIAGNOSIS — Z7901 Long term (current) use of anticoagulants: Secondary | ICD-10-CM

## 2016-06-22 DIAGNOSIS — I4892 Unspecified atrial flutter: Secondary | ICD-10-CM

## 2016-06-22 DIAGNOSIS — Z5181 Encounter for therapeutic drug level monitoring: Secondary | ICD-10-CM | POA: Diagnosis not present

## 2016-06-22 DIAGNOSIS — I480 Paroxysmal atrial fibrillation: Secondary | ICD-10-CM

## 2016-06-22 DIAGNOSIS — I4891 Unspecified atrial fibrillation: Secondary | ICD-10-CM | POA: Diagnosis not present

## 2016-06-22 LAB — POCT INR: INR: 2.5

## 2016-07-04 ENCOUNTER — Ambulatory Visit (INDEPENDENT_AMBULATORY_CARE_PROVIDER_SITE_OTHER): Payer: Medicare HMO | Admitting: Family Medicine

## 2016-07-04 ENCOUNTER — Ambulatory Visit (INDEPENDENT_AMBULATORY_CARE_PROVIDER_SITE_OTHER)
Admission: RE | Admit: 2016-07-04 | Discharge: 2016-07-04 | Disposition: A | Discharge status: Home / Self Care | Payer: Medicare HMO | Source: Ambulatory Visit | Attending: Family Medicine | Admitting: Family Medicine

## 2016-07-04 ENCOUNTER — Other Ambulatory Visit: Payer: Self-pay | Admitting: Cardiology

## 2016-07-04 ENCOUNTER — Encounter: Payer: Self-pay | Admitting: Family Medicine

## 2016-07-04 VITALS — BP 128/70 | HR 51 | Temp 97.5°F | Ht 67.75 in | Wt 153.2 lb

## 2016-07-04 DIAGNOSIS — R05 Cough: Secondary | ICD-10-CM

## 2016-07-04 DIAGNOSIS — R252 Cramp and spasm: Secondary | ICD-10-CM

## 2016-07-04 DIAGNOSIS — R059 Cough, unspecified: Secondary | ICD-10-CM

## 2016-07-04 MED ORDER — BENZONATATE 200 MG PO CAPS: 200.0000 mg | ORAL_CAPSULE | Freq: Three times a day (TID) | ORAL | 1 refills | Status: DC | PRN

## 2016-07-04 NOTE — Assessment & Plan Note (Signed)
Worse since Thursday-mostly dry w/o a lot of assoc symptoms No gerd symptoms Some mild post nasal drip-will try otc antihistamine  cxr today Disc poss of ace cough - may need to have a trial off of it or change to arb Pending rad report Px tessalon for cough  Re assuring exam

## 2016-07-04 NOTE — Patient Instructions (Addendum)
For cough- chest xray today  Try the tessalon pills as directed as needed for cough  Also an antihistamine like zyrtec or allegra or claritin over the counter = this will help post nasal drip If everything is normal - your lisinopril may cause a cough so we need to consider this   For leg cramps - checking some labs today  Try to stretch with toes up - and do stretching during the day  Make sure to eat a balanced diet   If symptoms suddenly worsen - let us know

## 2016-07-04 NOTE — Assessment & Plan Note (Signed)
Occ/ nocturnal  Disc imp of stretching-esp after activity  Continue balanced diet  BMP today-has not missed any K doses  Also TSH

## 2016-07-04 NOTE — Progress Notes (Signed)
Pre visit review using our clinic review tool, if applicable. No additional management support is needed unless otherwise documented below in the visit note. 

## 2016-07-04 NOTE — Progress Notes (Signed)
Subjective:    Patient ID: [name], female    DOB: 14-Aug-1946, 70 y.o.   MRN: [id number]  HPI Here for several problems including cough and leg cramps  70 yo with hx of CHF and mood disorder and hypothyroidism and mild cog impairment   She has had a bad cough since Thursday  Getting a headache from coughing  She has chronic post nasal drip  Not blowing nose more than usual  Today- coughing a tiny bit of phlegm (was prev dry)- this is clear  Nasal d/c is clear also  Does have allergies  (allergies have never made her cough this much) No fever No ST  Just feels tired from not sleeping  Took some mucinex over the counter (the cold and flu type)    Also her legs are cramping  She works out in the yard a lot  Legs really ache at night (she has broken veins in her ankles) Has had some cramping  Has not missed any K doses     Wt Readings from Last 3 Encounters:  07/04/16 153 lb 4 oz (69.5 kg)  03/14/16 145 lb 4 oz (65.9 kg)  03/04/16 143 lb (64.9 kg)   Recently wt has gone up - from calorie intake (thinks it is from eating too much)  Not sob or swollen   BP Readings from Last 3 Encounters:  07/04/16 128/70  03/14/16 121/63  03/04/16 140/80    She does take K for hypokalemia   Lab Results  Component Value Date   K 5.0 03/04/2016   Lab Results  Component Value Date   TSH 4.45 08/26/2015      Patient Active Problem List   Diagnosis Date Noted  . Cough 07/04/2016  . Leg cramps 07/04/2016  . Fracture of left pelvis ([place]) 04/29/2015  . PAF (paroxysmal atrial fibrillation) ([place]) 03/04/2015  . Atrial flutter ([place]) 03/04/2015  . On amiodarone therapy 11/14/2014  . Current use of long term anticoagulation 11/14/2014  . Mild dementia 10/24/2014  . Chronic combined systolic and diastolic CHF, NYHA class 1 ([place]) 09/29/2014  . 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 Hyperlipidemia LDL goal <100 04/05/2010  . GERD 04/05/2010  . OTHER  MALAISE AND FATIGUE 03/31/2010  . 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 Hypothyroid 11/18/2008  . Generalized anxiety disorder 11/18/2008  . Major depressive disorder, recurrent episode, moderate with anxious distress ([place]) 11/18/2008  . ASTHMA 11/18/2008  . URINARY INCONTINENCE 11/18/2008  . NEPHROLITHIASIS, HX OF 11/18/2008   Past Medical History:  Diagnosis Date  . Acne rosacea   . Anxiety   . Asthma   . Atrial fibrillation and flutter ([place]) 09/2014   Revereted from Afib RVR to 2:1 A Flutter -- TEE/ DCCV 2/9; on Amiodarone  . Chronic combined systolic and diastolic CHF, NYHA class 1 ([facility]) -- Systolic dysfunction 99991111 09/2014   Exacerbation 09/2014 2/2 Afib/flutter with RVR - 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 Mod LA dilation, normal PA pressures   . CVA (cerebral infarction)    h/o superior cerebellar infarct  . Depression   . Digoxin toxicity 09/2014  . 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 Diverticulosis   . H/O: rheumatic fever    Childhood  . HYPERLIPIDEMIA 04/05/2010   Qualifier: Diagnosis of  By: Lorelei Pont MD, Frederico Hamman    . Hypothyroid    On replacement therapy; normal TSH 09/2014  . Migraine headache   . Osteopenia   . Paroxysmal atrial fibrillation ([place]) 2005; Recurrent 09/2014   a) 2005: s/p [facility] Maze & LAA ligation; b) 09/2014: TEE-cardioversion  . Rheumatic mitral and aortic valve insufficiency 2005   a) s/p MV Ring repair; with Cox-Maze for Afib; b) Echo 2013: EF  60-65%,no Regional WMA, Gr 2 DD, no Sig MR ;; c) TEE 10/07/2014: Severlely thickened and calcified MV leaflets. Posterior MV leaflet is fixed and immobile. MAC. reduced excursion of the anterior MV leaflet. Mean MV gradient was 48mm Hg and MVA calculated at cm2.    . Urinary incontinence    Past Surgical History:  Procedure Laterality Date  . ABDOMINAL HYSTERECTOMY  1980s   (no cancer),ovaries present  . CARDIAC CATHETERIZATION  Jan 2005   Pre-op R&LHC -- Nonobstructive CAD; normal PA pressures  . CARDIOVERSION  06/04/2012   Procedure: CARDIOVERSION;  Surgeon: Carlena Bjornstad, MD;  Location: [facility];  Service: Cardiovascular;  Laterality: N/A;  . CARDIOVERSION N/A 10/07/2014   Procedure: CARDIOVERSION;  Surgeon: Sueanne Margarita, MD;  Location: [facility] ENDOSCOPY;  Service: Cardiovascular;  Laterality: N/A;  . LUMBAR DISC SURGERY     x 2 distantly  . MITRAL VALVE REPAIR  2005   with [facility] Maze for [employer]  . NM MYOVIEW LTD  4/7/'16   EF 37% with septal hypokinesis and diffuse hypokinesis. No ischemia or infarction. 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  Marland Kitchen RIGHT HEART CATHETERIZATION N/A 10/03/2014   Procedure: RIGHT HEART CATH;  Surgeon: Jolaine Artist, MD;  Location: [facility] CATH LAB;  Service: Cardiovascular;  RAP 87mmHg, RVP 35/2/9 mmHg, PAP 45/12 mmHg, PCWP 16 mmHg; CO/I by Fick: 3.9/2.3; Ao/PA/SVC SaO2%: 97%/63%/65%.  . TEE WITHOUT CARDIOVERSION N/A 10/07/2014   Procedure: TRANSESOPHAGEAL ECHOCARDIOGRAM (TEE);  Surgeon: Sueanne Margarita, MD;  Location: [facility] ENDOSCOPY;  Service: Cardiovascular;  Laterality: N/A;  . THYROIDECTOMY, PARTIAL     partial-no cancer; now on thyroid replacement  . TRANSTHORACIC ECHOCARDIOGRAM  2013; 2/'16 & 5/'16   a) 2013: EF 60-65%, mild MR, Gr 2 DD; b) 2/'16 @ [facility]: EF ~10% - severel global LV dysfunction (systolic & diastolic) - dilated LV & restrictive filling pattern - Gr 3 DD, mod reduced RV function, severely  dilated LA - 6.4 cm, mod dilated RA, mod pl effusion, mod MR, mild TR; c) 5/10/'16:  EF 25-30%, mod LV dilation, no RWMA,Gr 3 DD w/ high LAP, MV sewing ring intact w/ Mod MR, Severe LA dilation  . TRANSTHORACIC ECHOCARDIOGRAM  January 2017:   EF improved to 45-50%. Mild diffuse HK. Mild MR. Moderate LA dilation. Normal PA pressures.   Social History  Substance Use Topics  . Smoking status: Never Smoker  . Smokeless tobacco: Never Used  . Alcohol use No   Family History  Problem Relation Age of Onset  . Diabetes Mother   . Coronary artery disease Mother   . Kidney disease Mother   . Breast cancer Neg Hx    No Known  Allergies Current Outpatient Prescriptions on File Prior to Visit  Medication Sig Dispense Refill  . acetaminophen (TYLENOL) 325 MG tablet Take 2 tablets (650 mg total) by mouth every 4 (four) hours as needed for mild pain, fever or headache. 30 tablet 0  . amiodarone (PACERONE) 200 MG tablet TAKE ONE TABLET BY MOUTH EVERY DAY 30 tablet 0  . Calcium Citrate-Vitamin D (CALCIUM + D PO) Take 2 capsules by mouth daily.    . carvedilol (COREG) 3.125 MG tablet TAKE ONE TABLET BY MOUTH TWICE DAILY 180 tablet 1  . Cyanocobalamin (VITAMIN B-12 PO) Take 1 tablet by mouth daily.    Marland Kitchen donepezil (ARICEPT) 5 MG tablet TAKE ONE TABLET BY MOUTH AT BEDTIME 30 tablet 5  . IRON, FERROUS SULFATE, PO Take 1 tablet by mouth daily.    Marland Kitchen levothyroxine (SYNTHROID, LEVOTHROID) 25 MCG tablet TAKE TWO AND ONE-HALF TABLETS BY MOUTH DAILY BEFORE BREAKFAST 75 tablet 11  . lisinopril (PRINIVIL,ZESTRIL) 5 MG tablet TAKE ONE TABLET BY MOUTH EVERY DAY 30 tablet 0  . Omega-3 Fatty Acids (FISH OIL PO) Take 1 capsule by mouth daily.    . potassium chloride SA (K-DUR,KLOR-CON) 20 MEQ tablet Take 20 mEq by mouth daily.      No current facility-administered medications on file prior to visit.     Review of Systems    Review of Systems  Constitutional: Negative for fever, appetite change, fatigue and unexpected  weight change.  Eyes: Negative for pain and visual disturbance.  Respiratory: Negative for wheeze and shortness of breath.  n Cardiovascular: Negative for cp or palpitations   neg for pedal edema or PND or orthopnea  Gastrointestinal: Negative for nausea, diarrhea and constipation.  Genitourinary: Negative for urgency and frequency.  Skin: Negative for pallor or rash   MSK neg for joint swelling or tenderness Neurological: Negative for weakness, light-headedness, numbness and headaches.  Hematological: Negative for adenopathy. Does not bruise/bleed easily.  Psychiatric/Behavioral: Negative for dysphoric mood. The patient is not nervous/anxious.   Objective:   Physical Exam  Constitutional: She appears well-developed and well-nourished. No distress.  Well appearing/ occ hacking cough  HENT:  Head: Normocephalic and atraumatic.  Mouth/Throat: Oropharynx is clear and moist.  Eyes: Conjunctivae and EOM are normal. Pupils are equal, round, and reactive to light.  Neck: Normal range of motion. Neck supple. No JVD present. Carotid bruit is not present. No thyromegaly present.  Cardiovascular: Normal rate, regular rhythm, normal heart sounds and intact distal pulses.  Exam reveals no gallop.   Pulmonary/Chest: Effort normal and breath sounds normal. No respiratory distress. She has no wheezes. She has no rales.  No crackles Cough sounds dry and hacking No rales or rhonchi No wheeze Good air exch  Abdominal: Soft. Bowel sounds are normal. She exhibits no distension, no abdominal bruit and no mass. There is no tenderness.  Musculoskeletal: She exhibits no edema.  No calf or leg tenderness or palp cords Neg Homan's sign  Nl  perfusion  Lymphadenopathy:    She has no cervical adenopathy.  Neurological: She is alert. She has normal reflexes. No cranial nerve deficit. She exhibits normal muscle tone. Coordination normal.  Skin: Skin is warm and dry. No rash noted.  Psychiatric: She has a normal  mood and affect.          Assessment & Plan:   Problem List Items Addressed This Visit      Other   Cough    Worse since Thursday-mostly dry w/o a lot  of assoc symptoms No gerd symptoms Some mild post nasal drip-will try otc antihistamine  cxr today Disc poss of ace cough - may need to have a trial off of it or change to arb Pending rad report Px tessalon for cough  Re assuring exam       Relevant Orders   DG Chest 2 View   Leg cramps    Occ/ nocturnal  Disc imp of stretching-esp after activity  Continue balanced diet  BMP today-has not missed any K doses  Also TSH      Relevant Orders   Basic metabolic panel   TSH

## 2016-07-05 LAB — BASIC METABOLIC PANEL
BUN: 15 mg/dL (ref 6–23)
CO2: 29 mEq/L (ref 19–32)
Calcium: 9.4 mg/dL (ref 8.4–10.5)
Chloride: 107 mEq/L (ref 96–112)
Creatinine, Ser: 1.02 mg/dL (ref 0.40–1.20)
GFR: 56.65 mL/min — AB (ref >60.00)
Glucose, Bld: 87 mg/dL (ref 70–99)
POTASSIUM: 4.5 meq/L (ref 3.5–5.1)
SODIUM: 142 meq/L (ref 135–145)

## 2016-07-05 LAB — TSH: TSH: 4.14 u[IU]/mL (ref 0.35–4.50)

## 2016-07-06 ENCOUNTER — Telehealth: Payer: Self-pay | Admitting: Family Medicine

## 2016-07-06 MED ORDER — LOSARTAN POTASSIUM 25 MG PO TABS: 25.0000 mg | ORAL_TABLET | Freq: Every day | ORAL | 5 refills | Status: DC

## 2016-07-06 NOTE — Telephone Encounter (Signed)
-----   Message from Tammi Sou, [place] sent at 07/05/2016  4:08 PM EST ----- Pt notified of xray results and Dr. Marliss Coots comments. Pt said she didn't get any sleep last night because she was coughing all night and now she is really hoarse

## 2016-07-06 NOTE — Telephone Encounter (Signed)
Pt notified of Dr. Marliss Coots comments and that Rx was sent to pharmacy and if no improvement to update [place] in a week, pt verbalized understanding

## 2016-07-06 NOTE — Addendum Note (Signed)
Addended by: Tammi Sou on: 07/06/2016 02:56 PM   Modules accepted: Orders

## 2016-07-06 NOTE — Telephone Encounter (Signed)
I checked in with Dr Edilia Bo We will change her lisinopril (which can cause cough) to losartan (similar med that does not typically cause cough  I will send new med to [place]  Let us know if no improvement in cough in the next week or if any problems or side effects   Stop lisinopril and replace it with losartan   Will cc PCP

## 2016-07-12 ENCOUNTER — Other Ambulatory Visit: Payer: Self-pay | Admitting: Cardiology

## 2016-07-12 NOTE — Telephone Encounter (Signed)
Rx(s) sent to pharmacy electronically.  

## 2016-07-13 ENCOUNTER — Ambulatory Visit (INDEPENDENT_AMBULATORY_CARE_PROVIDER_SITE_OTHER): Payer: Medicare HMO

## 2016-07-13 DIAGNOSIS — I4891 Unspecified atrial fibrillation: Secondary | ICD-10-CM

## 2016-07-13 DIAGNOSIS — Z7901 Long term (current) use of anticoagulants: Secondary | ICD-10-CM | POA: Diagnosis not present

## 2016-07-13 DIAGNOSIS — Z5181 Encounter for therapeutic drug level monitoring: Secondary | ICD-10-CM | POA: Diagnosis not present

## 2016-07-13 DIAGNOSIS — I480 Paroxysmal atrial fibrillation: Secondary | ICD-10-CM

## 2016-07-13 DIAGNOSIS — I4892 Unspecified atrial flutter: Secondary | ICD-10-CM

## 2016-07-13 LAB — POCT INR: INR: 1.9

## 2016-07-20 ENCOUNTER — Encounter: Payer: Self-pay | Admitting: Internal Medicine

## 2016-07-20 ENCOUNTER — Ambulatory Visit (INDEPENDENT_AMBULATORY_CARE_PROVIDER_SITE_OTHER): Payer: Medicare HMO | Admitting: Internal Medicine

## 2016-07-20 VITALS — BP 108/60 | HR 49 | Ht 67.5 in | Wt 155.0 lb

## 2016-07-20 DIAGNOSIS — I428 Other cardiomyopathies: Secondary | ICD-10-CM

## 2016-07-20 DIAGNOSIS — I480 Paroxysmal atrial fibrillation: Secondary | ICD-10-CM

## 2016-07-20 DIAGNOSIS — I38 Endocarditis, valve unspecified: Secondary | ICD-10-CM | POA: Diagnosis not present

## 2016-07-20 DIAGNOSIS — Z79899 Other long term (current) drug therapy: Secondary | ICD-10-CM | POA: Diagnosis not present

## 2016-07-20 MED ORDER — AMIODARONE HCL 200 MG PO TABS: 200.0000 mg | ORAL_TABLET | Freq: Every day | ORAL | 3 refills | Status: DC

## 2016-07-20 MED ORDER — AMIODARONE HCL 200 MG PO TABS
200.0000 mg | ORAL_TABLET | Freq: Every day | ORAL | 4 refills | Status: DC
Start: 2016-07-20 — End: 2016-07-20

## 2016-07-20 NOTE — Patient Instructions (Addendum)
Medication Instructions:  Continue current medications.   Labwork: NONE  Testing/Procedures: Your physician has recommended that you have a pulmonary function testing. Pulmonary Function Tests are a group of tests that measure how well air moves in and out of your lungs. SCHEDULE FOR IN January 2018.   Follow-Up: Your physician wants you to follow-up in: 6 MONTHS WITH DR END. You will receive a reminder letter in the mail two months in advance. If you don't receive a letter, please call our office to schedule the follow-up appointment.   Any Other Special Instructions Will Be Listed Below (If Applicable).  Pulmonary Function Tests Pulmonary function tests (PFTs) measure how well your lungs are working. The tests can help to identify the causes of lung problems. They can also help your health care provider select the best treatment for you. Your health care provider may order pulmonary function for any of the following reasons:  When an illness involving the lungs is suspected.  To follow changes in your lung function over time if you are known to have a chronic lung disease.  For industrial plant workers to examine the effects of being exposed to chemicals over a long period of time.  To assess lung function prior to surgery or other procedures.  For people who are smokers. Your measured lung function will be compared to the expected lung function of someone with healthy lungs who is similar to you in age, gender, size, and other factors. This is used to determine your "percent predicted" lung function, which is how your health care provider knows if your lung function is normal or abnormal. If you have had prior pulmonary function testing performed, your health care provider will also compare your current results with past tests to see if your lung function is better, worse, or staying the same. This can sometimes be useful to see if treatments are working.  LET [facility] CARE  PROVIDER KNOW ABOUT:  Any allergies you have.  All medicines you are taking, including inhaler or nebulizer medicines, vitamins, herbs, eye drops, creams, and over-the-counter medicines.  Any blood disorders you have.  Previous surgeries you have had, especially recent eye surgery, abdominal surgery, or chest surgery. These can make performing pulmonary function tests difficult or unsafe.  Medical conditions you have.  Chest pain or heart problems.  Tuberculosis or respiratory infections, such as pneumonia, a cold, or the flu. If you think you will have difficulty performing any of the breathing maneuvers, ask your health care provider if you should reschedule the test. RISKS AND COMPLICATIONS: Generally, pulmonary function testing is a safe procedure. However, as with any procedure, complications can occur. Possible complications include:  Lightheadedness due to overbreathing (hyperventilation).  An asthmatic attack from deep breathing. BEFORE THE PROCEDURE  Take medicine as directed by your health care provider. If you take inhaler or nebulizer medicines, ask your health care provider which medicines you should take on the day of your test. Some inhaler medicines may interfere with pulmonary function tests, such as bronchodilator testing, if taken shortly before the test.  Avoid eating a large meal before your test.  Do not smoke before your test.  Wear comfortable clothing which will not interfere with breathing. PROCEDURE  You will be given a soft nose clip to wear during the procedure. This is done so that all of your breaths will go through your mouth instead of your nose.  You will be given a germ-free (sterile) mouthpiece. It will be attached to a  spirometer. The spirometer is the machine that measures your breathing.  You will be instructed to perform various breathing maneuvers. The maneuvers will be done by breathing in (inhaling) and breathing out (exhaling).  Depending on what measurements are ordered, you may be asked to repeat the maneuvers several times before the test is completed.  It is important to follow the instructions exactly to obtain accurate results. Make sure to blow as hard and as fast as you can when you are instructed to do so.  You may be given a bronchodilator after testing has been performed. A bronchodilator is a medicine which makes the small air passages in your lungs larger. These medicines usually make it easier to breathe. The tests are then repeated several minutes later after the bronchodilator has taken effect.  You will be monitored carefully during the procedure for faintness, dizziness, difficulty breathing, or any other problems. AFTER THE PROCEDURE   You may resume your usual diet, medicines, and activities as directed by your health care provider.  Your health care provider will go over your test results with you and determine what treatments may be helpful. This information is not intended to replace advice given to you by your health care provider. Make sure you discuss any questions you have with your health care provider. Document Released: 04/07/2004 Document Revised: 06/05/2013 Document Reviewed: 03/14/2013 Elsevier Interactive Patient Education  2017 [employer].    If you need a refill on your cardiac medications before your next appointment, please call your pharmacy.

## 2016-07-20 NOTE — Progress Notes (Signed)
Follow-up Outpatient Visit Date: 07/20/2016  Chief Complaint: Follow-up atrial fibrillation, non-ischemic cardiomyopathy, and valvular heart disease  HPI:  [name] is a 70 y.o. 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  She was last seen by Dr. Ellyn Hack on 09/30/15.  Since that time, she has been doing well without shortness of breath, edema, orthopnea, and PND.  She also denies palpitations, lightheadedness, and chest pain.  She has noticed a cough over the last 3 weeks, which she attributes to a respiratory infection.  It began with mild sputum production and nasal drainage.  The cough is now non-productive and seems to be getting better.  She is currently on losartan, due to a cough associated with lisinopril use.  She remains on chronic warfarin managed by our anticoagulation clinic.  Her most recent INR was 1.9 on 07/13/16.  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  LVEF 50% with severe mitral valve prolapse and MR.  RA 3, RV 28/4, PA 21/10 (16), PCWP 12.  Thermodiluation CO 2.5.  CV Surgery:  Mitral valve repair with placement of 71mm Seguin annuloplasty ring and modified Cox-Maze procedure (10/15/03, Dr. Roxy Manns).  EP Procedures and Devices:  DCCV  (03/12/04)  DCCV (06/04/12)  DCCV (10/07/14)  Non-Invasive Evaluation(s):  Myocardial perfusion stress test (12/04/14): No ischemia identified.  LVEF 37% with global hypokinesis, most pronounced in the septum.  Intermediate risk study (due to low LVEF).  TTE (01/06/15): Moderately dilated LV with normal wall thickness.  LVEF 25-30% with grade 3 diastolic dysfunction.  Moderate MR status post mitral valve repair.  LA severely dilated.  Mild TR.  Normal PA pressure.  Normal RV size and function.  TTE (09/09/15): Mildly dilated LV with LVEF 45-50%.  Trivial AI and mild MR.  Moderately dilated LA.  Normal RV size/function and PA pressure.  Recent CV Pertinent Labs: Lab Results  Component Value Date   CHOL 190 03/07/2016   CHOL 123 09/30/2014   HDL 68.80 03/07/2016   HDL 53 09/30/2014   LDLCALC 85 03/07/2016   LDLCALC 51 09/30/2014   LDLDIRECT 132.3 03/31/2010   TRIG 179.0 (H) 03/07/2016   TRIG 93 09/30/2014   CHOLHDL 3 03/07/2016   INR 1.9 07/13/2016   INR 11.5 (A) 05/05/2015   INR 2.5 10/03/2014   BNP 691.1 (H) 10/15/2014   K 4.5 07/04/2016   K 4.2 10/03/2014   MG 2.1 10/03/2014   MG 2.3 10/03/2014   BUN 15 07/04/2016   BUN 9 01/27/2015   BUN 8 10/03/2014   CREATININE 1.02 07/04/2016   CREATININE 0.81 10/03/2014   CREATININE 1.06 09/10/2012    Past medical and surgical history were reviewed and updated in EPIC.   Outpatient Encounter Prescriptions as of 07/20/2016  Medication Sig  . acetaminophen (TYLENOL) 325 MG tablet Take 2 tablets (650 mg total) by mouth every 4 (four) hours as needed for mild pain, fever or headache.  Marland Kitchen amiodarone (PACERONE) 200 MG tablet  Take 1 tablet (200 mg total) by mouth daily. PLEASE CONTACT OFFICE FOR ADDITIONAL REFILLS  . benzonatate (TESSALON) 200 MG capsule Take 1 capsule (200 mg total) by mouth 3 (three) times daily as needed for cough. Swallow whole-do not bite pill  . Calcium Citrate-Vitamin D (CALCIUM + D PO) Take 2 capsules by mouth daily.    . carvedilol (COREG) 3.125 MG tablet TAKE ONE TABLET BY MOUTH TWICE DAILY  . Cyanocobalamin (VITAMIN B-12 PO) Take 1 tablet by mouth daily.  Marland Kitchen donepezil (ARICEPT) 5 MG tablet TAKE ONE TABLET BY MOUTH AT BEDTIME  . IRON, FERROUS SULFATE, PO Take 1 tablet by mouth daily.  Marland Kitchen levothyroxine (SYNTHROID, LEVOTHROID) 25 MCG tablet TAKE TWO AND ONE-HALF TABLETS BY MOUTH DAILY BEFORE BREAKFAST  . losartan (COZAAR) 25 MG tablet Take 1 tablet (25 mg total) by mouth daily.  . Omega-3 Fatty Acids (FISH OIL PO) Take 1 capsule by mouth daily.  . potassium chloride SA (K-DUR,KLOR-CON) 20 MEQ tablet Take 20 mEq by mouth daily.   Marland Kitchen warfarin (COUMADIN) 5 MG tablet TAKE AS DIRECTED BY COUMADIN CLINIC   No facility-administered encounter medications on file as of 07/20/2016.     Allergies: Patient has no known allergies.  Social History   Social History  . Marital status: Married    Spouse name: N/A  . Number of children: 2  . Years of education: N/A   Occupational History  . Retired    Social History Main Topics  . Smoking status: Never Smoker  . Smokeless tobacco: Never Used  . Alcohol use No  . Drug use: No  . Sexual activity: Yes   Other Topics Concern  . Not on file   Social History Narrative  . No narrative on file    Family History  Problem Relation Age of Onset  . Diabetes Mother   . Coronary artery disease Mother   . Kidney disease Mother   . Breast cancer Neg Hx     Review of Systems: Patient reports chronic bilateral knee pain when walking as well as insomnia that has not improved significantly with melatonin or OTC sleep aids.  She also notes occasional blurry vision when driving at night.  She is planning to find a new eye doctor for further evaluation.  Otherwise, a 12-system review of systems was performed and was negative except as noted in the HPI.  --------------------------------------------------------------------------------------------------  Physical Exam: BP  108/60 (BP Location: Left Arm, Patient Position: Sitting, Cuff Size: Normal)   Pulse (!) 49   Ht 5' 7.5" (1.715 m)   Wt 155 lb (70.3 kg)   BMI 23.92 kg/m   General:  Well-developed, well-nourished woman seated comfortably in the exam room. HEENT: No conjunctival pallor or scleral icterus.  Moist mucous membranes.  OP clear. Neck: Supple without lymphadenopathy, thyromegaly, JVD, or HJR. Lungs: Normal work of breathing.  Clear to auscultation bilaterally without wheezes or crackles. Heart: Distant heart sounds.  Regular rate and rhythm without murmurs, rubs, or gallops.  Non-displaced PMI. Abd: Bowel sounds present.  Soft, NT/ND without hepatosplenomegaly Ext: No lower extremity edema.  Radial, PT, and DP pulses are 2+ bilaterally. Skin: warm and dry without rash  EKG:  Sinus bradycardia (HR 49 bpm) with inferolateral ST/T abnormality.  No significant change from prior tracing on 09/30/15 (I have personally reviewed both tracings).  Lab Results  Component Value Date   WBC 5.0 03/04/2016   HGB 12.6 03/04/2016   HCT 37.3 03/04/2016   MCV 98.8 03/04/2016  PLT 210.0 03/04/2016    Lab Results  Component Value Date   NA 142 07/04/2016   K 4.5 07/04/2016   CL 107 07/04/2016   CO2 29 07/04/2016   BUN 15 07/04/2016   CREATININE 1.02 07/04/2016   GLUCOSE 87 07/04/2016   ALT 20 03/04/2016    Lab Results  Component Value Date   CHOL 190 03/07/2016   HDL 68.80 03/07/2016   LDLCALC 85 03/07/2016   LDLDIRECT 132.3 03/31/2010   TRIG 179.0 (H) 03/07/2016   CHOLHDL 3 03/07/2016    --------------------------------------------------------------------------------------------------  ASSESSMENT AND PLAN: Valvular heart disease: Today's exam is without significant murmurs.  Echocardiogram in January showed mild MR and trivial AI.  Given lack of new symptoms, we will continue with clinical follow-up and our current medication regimen.  Non-ischemic cardiomyopathy: Ms. Mulgrew appears  euvolemic and well-compensated with NYHA class I symptoms (though she is somewhat limited in her activities by knee pain).  Her echocardiogram in 08/2015 showed near-normal LV contraction.  We will continue with current doses of carvedilol and losartan.  Bradycardia and low-normal blood pressure preclude further uptitration of medication.  Paroxysmal atrial fibrillation: EKG today demonstrates sinus bradycardia, similar to prior visits.  Though her resting heart rate is in the 40's today, she is asymptomatic.  We will not make any medication changes today.  LFT's and TSH within the last 6 months were normal.  We will plan for PFT's including DLCO in January, once she has recovered from her nasal drainage and cough.  We will continue with indefinite warfarin, per the anticoagulation clinic.  Follow-up: Return to clinic in 6 months.  Nelva Bush, MD 07/20/2016 11:01 AM

## 2016-08-08 ENCOUNTER — Other Ambulatory Visit: Payer: Self-pay | Admitting: Cardiology

## 2016-08-10 ENCOUNTER — Ambulatory Visit (INDEPENDENT_AMBULATORY_CARE_PROVIDER_SITE_OTHER): Payer: Medicare HMO

## 2016-08-10 DIAGNOSIS — I4891 Unspecified atrial fibrillation: Secondary | ICD-10-CM

## 2016-08-10 DIAGNOSIS — Z5181 Encounter for therapeutic drug level monitoring: Secondary | ICD-10-CM

## 2016-08-10 DIAGNOSIS — Z7901 Long term (current) use of anticoagulants: Secondary | ICD-10-CM

## 2016-08-10 DIAGNOSIS — I4892 Unspecified atrial flutter: Secondary | ICD-10-CM

## 2016-08-10 DIAGNOSIS — I480 Paroxysmal atrial fibrillation: Secondary | ICD-10-CM

## 2016-08-10 LAB — POCT INR: INR: 2.3

## 2016-08-31 ENCOUNTER — Other Ambulatory Visit: Payer: Self-pay | Admitting: Family Medicine

## 2016-09-07 ENCOUNTER — Ambulatory Visit (INDEPENDENT_AMBULATORY_CARE_PROVIDER_SITE_OTHER): Payer: Medicare HMO

## 2016-09-07 DIAGNOSIS — Z7901 Long term (current) use of anticoagulants: Secondary | ICD-10-CM

## 2016-09-07 DIAGNOSIS — Z5181 Encounter for therapeutic drug level monitoring: Secondary | ICD-10-CM | POA: Diagnosis not present

## 2016-09-07 DIAGNOSIS — I4892 Unspecified atrial flutter: Secondary | ICD-10-CM

## 2016-09-07 DIAGNOSIS — I4891 Unspecified atrial fibrillation: Secondary | ICD-10-CM | POA: Diagnosis not present

## 2016-09-07 DIAGNOSIS — I480 Paroxysmal atrial fibrillation: Secondary | ICD-10-CM

## 2016-09-07 LAB — POCT INR: INR: 1.5

## 2016-09-08 ENCOUNTER — Other Ambulatory Visit: Payer: Self-pay | Admitting: *Deleted

## 2016-09-08 DIAGNOSIS — I5042 Chronic combined systolic (congestive) and diastolic (congestive) heart failure: Secondary | ICD-10-CM

## 2016-09-08 DIAGNOSIS — I480 Paroxysmal atrial fibrillation: Secondary | ICD-10-CM

## 2016-09-14 ENCOUNTER — Ambulatory Visit: Payer: Medicare HMO | Admitting: Family Medicine

## 2016-09-20 ENCOUNTER — Ambulatory Visit: Payer: Medicare HMO | Attending: Internal Medicine

## 2016-09-20 DIAGNOSIS — I480 Paroxysmal atrial fibrillation: Secondary | ICD-10-CM | POA: Diagnosis not present

## 2016-09-20 DIAGNOSIS — I5042 Chronic combined systolic (congestive) and diastolic (congestive) heart failure: Secondary | ICD-10-CM | POA: Diagnosis not present

## 2016-09-21 ENCOUNTER — Ambulatory Visit (INDEPENDENT_AMBULATORY_CARE_PROVIDER_SITE_OTHER): Payer: Medicare HMO

## 2016-09-21 DIAGNOSIS — Z5181 Encounter for therapeutic drug level monitoring: Secondary | ICD-10-CM | POA: Diagnosis not present

## 2016-09-21 DIAGNOSIS — I4891 Unspecified atrial fibrillation: Secondary | ICD-10-CM | POA: Diagnosis not present

## 2016-09-21 DIAGNOSIS — I4892 Unspecified atrial flutter: Secondary | ICD-10-CM | POA: Diagnosis not present

## 2016-09-21 DIAGNOSIS — Z7901 Long term (current) use of anticoagulants: Secondary | ICD-10-CM | POA: Diagnosis not present

## 2016-09-21 DIAGNOSIS — I480 Paroxysmal atrial fibrillation: Secondary | ICD-10-CM

## 2016-09-21 LAB — POCT INR: INR: 1.8

## 2016-09-26 ENCOUNTER — Other Ambulatory Visit: Payer: Self-pay | Admitting: Family Medicine

## 2016-10-03 ENCOUNTER — Other Ambulatory Visit: Payer: Self-pay | Admitting: *Deleted

## 2016-10-03 MED ORDER — AMIODARONE HCL 200 MG PO TABS: 200.0000 mg | ORAL_TABLET | Freq: Every day | ORAL | 3 refills | Status: DC

## 2016-10-03 NOTE — Telephone Encounter (Signed)
Patient left a msg on the refill vm stating that she does not have any refills remaining on amiodarone per [facility].

## 2016-10-03 NOTE — Telephone Encounter (Signed)
Amiodarone sent to [facility].

## 2016-10-05 ENCOUNTER — Telehealth: Payer: Self-pay | Admitting: Internal Medicine

## 2016-10-05 ENCOUNTER — Ambulatory Visit (INDEPENDENT_AMBULATORY_CARE_PROVIDER_SITE_OTHER): Payer: Medicare HMO

## 2016-10-05 DIAGNOSIS — Z79899 Other long term (current) drug therapy: Secondary | ICD-10-CM

## 2016-10-05 DIAGNOSIS — I4891 Unspecified atrial fibrillation: Secondary | ICD-10-CM | POA: Diagnosis not present

## 2016-10-05 DIAGNOSIS — I480 Paroxysmal atrial fibrillation: Secondary | ICD-10-CM

## 2016-10-05 DIAGNOSIS — Z7901 Long term (current) use of anticoagulants: Secondary | ICD-10-CM | POA: Diagnosis not present

## 2016-10-05 DIAGNOSIS — Z5181 Encounter for therapeutic drug level monitoring: Secondary | ICD-10-CM

## 2016-10-05 DIAGNOSIS — I4892 Unspecified atrial flutter: Secondary | ICD-10-CM

## 2016-10-05 LAB — POCT INR: INR: 3.4

## 2016-10-05 NOTE — Telephone Encounter (Signed)
Left message with patient's husband to call the [facility] clinic to discuss results of PFT's.  Mild obstructive component was noted, though measurements also reveal a drop in DLCO.  Given that she is on long-term amiodarone therapy, I will refer her to pulmonary to better assess the significance of DLCO decline and need for alternative rhythm control therapy.  Nelva Bush, MD [facility] Pager: [phone]

## 2016-10-05 NOTE — Telephone Encounter (Signed)
Order for Referral to pulmonology entered into EPIC. Message sent to scheduling.

## 2016-10-05 NOTE — Telephone Encounter (Signed)
Received incoming call from patient. Gave her the results of PFT's according to Dr Darnelle Bos note. She verbalized understanding that someone will call her to schedule an appt.

## 2016-10-07 NOTE — Telephone Encounter (Signed)
Patient has been scheduled for 11/07/16 with Pulmonary.

## 2016-10-10 ENCOUNTER — Encounter: Payer: Self-pay | Admitting: Sports Medicine

## 2016-10-10 ENCOUNTER — Ambulatory Visit: Payer: Medicare Other | Admitting: Physician Assistant

## 2016-10-10 ENCOUNTER — Ambulatory Visit (INDEPENDENT_AMBULATORY_CARE_PROVIDER_SITE_OTHER): Payer: Medicare HMO

## 2016-10-10 ENCOUNTER — Ambulatory Visit (INDEPENDENT_AMBULATORY_CARE_PROVIDER_SITE_OTHER): Payer: Medicare HMO | Admitting: Sports Medicine

## 2016-10-10 VITALS — BP 122/72 | HR 53 | Ht 69.0 in | Wt 163.8 lb

## 2016-10-10 DIAGNOSIS — M545 Low back pain, unspecified: Secondary | ICD-10-CM

## 2016-10-10 DIAGNOSIS — M25561 Pain in right knee: Secondary | ICD-10-CM

## 2016-10-10 DIAGNOSIS — G8929 Other chronic pain: Secondary | ICD-10-CM | POA: Diagnosis not present

## 2016-10-10 DIAGNOSIS — M1711 Unilateral primary osteoarthritis, right knee: Secondary | ICD-10-CM | POA: Diagnosis not present

## 2016-10-10 DIAGNOSIS — S3992XA Unspecified injury of lower back, initial encounter: Secondary | ICD-10-CM | POA: Diagnosis not present

## 2016-10-10 DIAGNOSIS — M25562 Pain in left knee: Secondary | ICD-10-CM | POA: Diagnosis not present

## 2016-10-10 DIAGNOSIS — M170 Bilateral primary osteoarthritis of knee: Secondary | ICD-10-CM

## 2016-10-10 DIAGNOSIS — M1712 Unilateral primary osteoarthritis, left knee: Secondary | ICD-10-CM | POA: Diagnosis not present

## 2016-10-10 MED ORDER — METHYLPREDNISOLONE 4 MG PO TBPK
ORAL_TABLET | ORAL | 0 refills | Status: DC
Start: 2016-10-10 — End: 2016-11-07

## 2016-10-10 NOTE — Patient Instructions (Signed)
Try using Ice for 10-15 min 2-3 times per day I have sent in a prescription to your pharmacy.  Please take it as prescribed  Call me on Friday and let me know how you are feeling

## 2016-10-10 NOTE — Progress Notes (Signed)
[name] - 71 y.o. female MRN [id number]  Date of birth: 1946-01-27  Office Visit Note: Visit Date: 10/10/2016 PCP: Owens Loffler, MD Referred by: Owens Loffler, MD  Subjective: Chief Complaint  Patient presents with  . Follow-up    Pt had a fall a week ago this past Thursday and injured back. The pain is on the lower right side of back. Its not a shooting pain, its just very sore and uncomfortable with movement. Pt also c/o bilateral knee pain which is not related to the fall. Knee pain has gotten worse over the past year.    HPI: Patient reports experiencing a fall landing on the lower lumbosacral region more on the right than the left.  She has had fairly significant amount of pain following this and is actually gotten to the point that she is uncomfortable in any position.  Pt denies any change in bowel or bladder habits, muscle weakness, numbness or recurrent falls associated with this pain. She additionally reports that her bilateral knees have been bothersome for quite some time and since she is here she would like to have these further assessed.  Denies any catching, locking or giving way.  She does have occasional grinding.  She has tried to intermittently. She has tried heat and ice but is unable to take anti-inflammatories due to her underlying anticoagulation. ROS: Otherwise per HPI.  Objective:  VS:  HT:5' 9"  (175.3 cm)   WT:163 lb 12.8 oz (74.3 kg)  BMI:24.2    BP:122/72  HR:(!) 53bpm  TEMP: ( )  RESP:98 % Physical Exam: Adult female.  No acute distress.   Alert and appropriately interactive. BACK: Painful bilateral straight leg raise Sensation intact to light touch in bilateral lower extremities No significant midline tenderness.  She does have a small amount of bruising along the posterior aspect of the lower lumbar/sacral region.  No focal step-off. Good internal and external rotation of the hips. Patient is able to heel and toe walk without significant  difficulty Manual muscle testing is 5+/5 without focality BILATERAL KNEES: Small effusions bilaterally.  No focal tenderness to palpation but general medial and lateral joint line pain.  She has a positive patellar grind.  She has 3-4 mm of opening with valgus stressing bilaterally.  Pain with McMurray's but no reproducible mechanical symptoms.  Imaging & Procedures: Dg Lumbar Spine 2-3 Views  Result Date: 10/10/2016 CLINICAL DATA:  PT c/o Rt lower back pain since fall last Thursday. Pt also c/o chronic bilateral knee pain. Pt c/o difficulty with ambulation and changes in position from standing/seated/supine. EXAM: LUMBAR SPINE - 2-3 VIEW COMPARISON:  CT 09/10/2012 FINDINGS: Previous median sternotomy. Negative for fracture or dislocation. Mild narrowing of interspaces L5-S1, L4-5. Negative for fracture. Patchy aortic calcifications without suggestion of aneurysm. IMPRESSION: 1. Negative for fracture or other acute bone abnormality. 2. Degenerative disc disease L4-S1. Electronically Signed   By: Lucrezia Europe M.D.   On: 10/10/2016 19:33   Dg Knee 1-2 Views Left  Result Date: 10/10/2016 CLINICAL DATA:  Chronic BILATERAL knee pain EXAM: RIGHT KNEE - 1-2 VIEW; LEFT KNEE - 1-2 VIEW COMPARISON:  None FINDINGS: RIGHT knee two views: Osseous demineralization. Joint space narrowing and spur formation greatest at patellofemoral joint. Chondrocalcinosis question CPPD. No acute fracture, dislocation, or bone destruction. No knee joint effusion. LEFT knee two views: Osseous demineralization. Patellofemoral joint space narrowing and minimal spurring. No acute fracture, dislocation, or bone destruction. Minimal chondrocalcinosis question CPPD. No knee joint effusion. IMPRESSION: Osseous  demineralization with degenerative changes and question CPPD at both knees. No acute abnormalities. Electronically Signed   By: Lavonia Dana M.D.   On: 10/10/2016 18:28   Dg Knee 1-2 Views Right  Result Date: 10/10/2016 CLINICAL DATA:   Chronic BILATERAL knee pain EXAM: RIGHT KNEE - 1-2 VIEW; LEFT KNEE - 1-2 VIEW COMPARISON:  None FINDINGS: RIGHT knee two views: Osseous demineralization. Joint space narrowing and spur formation greatest at patellofemoral joint. Chondrocalcinosis question CPPD. No acute fracture, dislocation, or bone destruction. No knee joint effusion. LEFT knee two views: Osseous demineralization. Patellofemoral joint space narrowing and minimal spurring. No acute fracture, dislocation, or bone destruction. Minimal chondrocalcinosis question CPPD. No knee joint effusion. IMPRESSION: Osseous demineralization with degenerative changes and question CPPD at both knees. No acute abnormalities. Electronically Signed   By: Lavonia Dana M.D.   On: 10/10/2016 18:28    Assessment & Plan: Problem List Items Addressed This Visit    Acute low back pain without sciatica - Primary    Patient does have a small amount of bruising over the posterior aspect of the back which is the area of most focal pain.  This is also the area of the sacral base there is a small possibility of a sacral insufficiency fracture however given the mechanism will begin with systemic steroids and if any lack of improvement consider MRI of the lumbosacral spine.      Relevant Medications   methylPREDNISolone (MEDROL DOSEPAK) 4 MG TBPK tablet   Other Relevant Orders   DG Lumbar Spine 2-3 Views (Completed)   Primary osteoarthritis of both knees    Discussed the options for injection therapy but given the other acute issues present at this time we will begin treating with systemic steroids since she is not a NSAID candidate due to her anticoagulation.  Continue working on strengthening as well as consider compression       Other Visit Diagnoses    Chronic pain of both knees       Relevant Medications   methylPREDNISolone (MEDROL DOSEPAK) 4 MG TBPK tablet   Other Relevant Orders   DG Knee 1-2 Views Right (Completed)   DG Knee 1-2 Views Left (Completed)        Follow-up: Return in about 4 weeks (around 11/07/2016).   Past Medical/Family/Surgical/Social History: Medications & Allergies reviewed per EMR Patient Active Problem List   Diagnosis Date Noted  . Acute low back pain without sciatica 10/12/2016  . Primary osteoarthritis of both knees 10/12/2016  . Cough 07/04/2016  . Leg cramps 07/04/2016  . Fracture of left pelvis ([place]) 04/29/2015  . PAF (paroxysmal atrial fibrillation) ([place]) 03/04/2015  . Atrial flutter ([place]) 03/04/2015  . On amiodarone therapy 11/14/2014  . Current use of long term anticoagulation 11/14/2014  . Mild dementia 10/24/2014  . Chronic combined systolic and diastolic CHF, NYHA class 1 ([place]) 09/29/2014  . 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 Hyperlipidemia LDL goal <100 04/05/2010  . GERD 04/05/2010  . OTHER MALAISE AND FATIGUE 03/31/2010  . 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 Hypothyroid 11/18/2008  . Generalized anxiety disorder 11/18/2008  . Major depressive disorder, recurrent episode, moderate with anxious distress ([place]) 11/18/2008  . ASTHMA 11/18/2008  . URINARY INCONTINENCE 11/18/2008  . NEPHROLITHIASIS, HX OF 11/18/2008   Past Medical History:  Diagnosis Date  . Acne rosacea   . Anxiety   . Asthma   .  Atrial fibrillation and flutter ([place]) 09/2014   Revereted from Afib RVR to 2:1 A Flutter -- TEE/ DCCV 2/9; on Amiodarone  . Chronic combined systolic and diastolic CHF, NYHA class 1 ([facility]) -- Systolic dysfunction [id number][N02.72] 09/2014   Exacerbation 09/2014 2/2 Afib/flutter with RVR - 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 Mod LA dilation, normal PA pressures   . CVA (cerebral infarction)    h/o superior cerebellar infarct  . Depression   . Digoxin toxicity 09/2014  . Dilated  cardiomyopathy secondary to tachycardia ([place]) 09/2014   a) [facility] Echo: EF ~10% Severe LV dilation & global LV Systolic dysfxn, restrictive filling pattern - Gr 3 DD, mod RV dysfxn, severe LA dilation - 6.4 cm, mod RA dil, mod pl effusion, mod MR, mild TR; b) TEE 10/07/14: EF ~10%, Severe LV dilation & dysfxn Mod RV dysfxn, LAA oversewn, Mod RA & TR  . Diverticulosis   . H/O: rheumatic fever    Childhood  . HYPERLIPIDEMIA 04/05/2010   Qualifier: Diagnosis of  By: Lorelei Pont MD, Frederico Hamman    . Hypothyroid    On replacement therapy; normal TSH 09/2014  . Migraine headache   . Osteopenia   . Paroxysmal atrial fibrillation ([place]) 2005; Recurrent 09/2014   a) 2005: s/p Cox Maze & LAA ligation; b) 09/2014: TEE-cardioversion  . Rheumatic mitral and aortic valve insufficiency 2005   a) s/p MV Ring repair; with Cox-Maze for Afib; b) Echo 2013: EF 60-65%,no Regional WMA, Gr 2 DD, no Sig MR ;; c) TEE 10/07/2014: Severlely thickened and calcified MV leaflets. Posterior MV leaflet is fixed and immobile. MAC. reduced excursion of the anterior MV leaflet. Mean MV gradient was 28m Hg and MVA calculated at cm2.    . Urinary incontinence    Family History  Problem Relation Age of Onset  . Diabetes Mother   . Coronary artery disease Mother   . Kidney disease Mother   . Breast cancer Neg Hx    Past Surgical History:  Procedure Laterality Date  . ABDOMINAL HYSTERECTOMY  1980s   (no cancer),ovaries present  . CARDIAC CATHETERIZATION  Jan 2005   Pre-op R&LHC -- Nonobstructive CAD; normal PA pressures  . CARDIOVERSION  06/04/2012   Procedure: CARDIOVERSION;  Surgeon: JCarlena Bjornstad MD;  [facility]  Service: Cardiovascular;  Laterality: N/A;  . CARDIOVERSION N/A 10/07/2014   Procedure: CARDIOVERSION;  Surgeon: TSueanne Margarita MD;  [facility];  Service: Cardiovascular;  Laterality: N/A;  . LUMBAR DISC SURGERY     x 2 distantly  . MITRAL VALVE REPAIR  2005   with Cox Maze for [employer] . [place] MYOVIEW  LTD  4/7/'16   EF 37% with septal hypokinesis and diffuse hypokinesis. No ischemia or infarction. 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  .Marland KitchenRIGHT HEART CATHETERIZATION N/A 10/03/2014   Procedure: RIGHT HEART CATH;  Surgeon: DJolaine Artist MD;  [facility];  Service: Cardiovascular;  RAP 580mg, RVP 35/2/9 mmHg, PAP 45/12 mmHg, PCWP 16 mmHg; CO/I by Fick: 3.9/2.3; Ao/PA/SVC SaO2%: 97%/63%/65%.  . TEE WITHOUT CARDIOVERSION N/A 10/07/2014   Procedure: TRANSESOPHAGEAL ECHOCARDIOGRAM (TEE);  Surgeon: TrSueanne MargaritaMD;  [facility];  Service: Cardiovascular;  Laterality: N/A;  . THYROIDECTOMY, PARTIAL     partial-no cancer; now on thyroid replacement  . TRANSTHORACIC ECHOCARDIOGRAM  2013; 2/'16 & 5/'16   a) 2013: EF 60-65%, mild  MR, Gr 2 DD; b) 2/'16 @ [place]: EF ~10% - severel global LV dysfunction (systolic & diastolic) - dilated LV & restrictive filling pattern - Gr 3 DD, mod reduced RV function, severely dilated LA - 6.4 cm, mod dilated RA, mod pl effusion, mod MR, mild TR; c) 5/10/'16:  EF 25-30%, mod LV dilation, no RWMA,Gr 3 DD w/ high LAP, MV sewing ring intact w/ Mod MR, Severe LA dilation  . TRANSTHORACIC ECHOCARDIOGRAM  January 2017:   EF improved to 45-50%. Mild diffuse HK. Mild MR. Moderate LA dilation. Normal PA pressures.   Social History   Occupational History  . Retired    Social History Main Topics  . Smoking status: Never Smoker  . Smokeless tobacco: Never Used  . Alcohol use No  . Drug use: No  . Sexual activity: Yes

## 2016-10-12 DIAGNOSIS — M170 Bilateral primary osteoarthritis of knee: Secondary | ICD-10-CM | POA: Insufficient documentation

## 2016-10-12 DIAGNOSIS — M545 Low back pain, unspecified: Secondary | ICD-10-CM | POA: Insufficient documentation

## 2016-10-12 NOTE — Assessment & Plan Note (Signed)
Discussed the options for injection therapy but given the other acute issues present at this time we will begin treating with systemic steroids since she is not a NSAID candidate due to her anticoagulation.  Continue working on strengthening as well as consider compression

## 2016-10-12 NOTE — Assessment & Plan Note (Signed)
Patient does have a small amount of bruising over the posterior aspect of the back which is the area of most focal pain.  This is also the area of the sacral base there is a small possibility of a sacral insufficiency fracture however given the mechanism will begin with systemic steroids and if any lack of improvement consider MRI of the lumbosacral spine.

## 2016-10-26 ENCOUNTER — Ambulatory Visit (INDEPENDENT_AMBULATORY_CARE_PROVIDER_SITE_OTHER): Payer: Medicare HMO

## 2016-10-26 DIAGNOSIS — I4891 Unspecified atrial fibrillation: Secondary | ICD-10-CM | POA: Diagnosis not present

## 2016-10-26 DIAGNOSIS — I4892 Unspecified atrial flutter: Secondary | ICD-10-CM

## 2016-10-26 DIAGNOSIS — I480 Paroxysmal atrial fibrillation: Secondary | ICD-10-CM

## 2016-10-26 DIAGNOSIS — Z5181 Encounter for therapeutic drug level monitoring: Secondary | ICD-10-CM | POA: Diagnosis not present

## 2016-10-26 DIAGNOSIS — Z7901 Long term (current) use of anticoagulants: Secondary | ICD-10-CM | POA: Diagnosis not present

## 2016-10-26 DIAGNOSIS — H353131 Nonexudative age-related macular degeneration, bilateral, early dry stage: Secondary | ICD-10-CM | POA: Diagnosis not present

## 2016-10-26 LAB — POCT INR: INR: 3.4

## 2016-10-27 DIAGNOSIS — H524 Presbyopia: Secondary | ICD-10-CM | POA: Diagnosis not present

## 2016-11-05 NOTE — Progress Notes (Signed)
[facility] Consultation      Assessment and Plan:  COPD. -Mild COPD seen on most recent PFT and chest x-ray imaging, without significant symptoms. -No need for treatment at this time. We'll continue to monitor.   Reduced diffusion capacity, currently taking amiodarone, Dilated cardiomyopathy, atrial fibrillation. -The patient's reduction in diffusing capacity could be due to variation, versus COPD, versus amiodarone, though the latter appears to be doubtful at this time. -We'll continue to monitor this with the yearly pulmonary function testing. -The patient is asked to call us back earlier if she noticed a decline in her respiratory capacity.  Insomnia. -This is currently only mildly helped by over-the-counter melatonin. We'll give sample of a REM fresh today, the patient can continue to take an over-the-counter if she finds it helpful. -Can consider ramelteon if above is not helpful. -Patient prefers not to be on any long-term potentially addictive substance, therefore, will not escalate medications further.  Date: 11/05/2016  MRN# [id number] [name] 09-30-1945  Referring Physician: Dr. Saunders Revel.   [name] is a 71 y.o. old female seen in consultation for chief complaint of:    Chief Complaint  Patient presents with  . Advice Only    ref by cardiology: allergy issues: denies SOB/cough;    HPI:  The patient is a 71 yo female with a history atrial fibrillation, dilated cardiomyopathy, depression, anxiety, sleep disturbance.  She was referred here today is of a PFT finding of reduced diffusion capacity. Previous lung function test from 11/18/14 showed a possible increased diffusion capacity. Most recent PFT from 09/20/16 shows mild reduction to 68%. The patient has a history of dilated cardiomyopathy and is on amiodarone for rate control, she is currently taking 200 mg daily. She has been on it for several years, but less than 10 years. She notes no  difficulty with breathing. She works in her yard, gardening, she has already planted for spring and hid minimal difficulty.  She notices difficulty only with "overdoing it" such as digging a lot and working for several hours in the garden.   She notes that she has trouble sleeping, she does not snore at night but she notes that her mind keeping working while laying in bed, and she notes that she has trouble relaxing. She currently takes melatonin which helps but not completely. She notes difficulty both falling and staying asleep.   She is a never smoker, her husband smoked for 40 years about a ppd; he quit recently. 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   **Review of tracings: PFT 09/20/16. Spirometry shows reduced. FEV to FVC ratio. FEV1 is normal. Lung volumes are normal without evidence of hyperinflation or air trapping. DLCO is 68%, mildly reduced. Per report, the previous PFT. 11/18/14, DLCO was "increased". **Review of films, chest x-ray 07/04/16: Mild hyperinflation, otherwise unremarkable lungs.  PMHX:   Past Medical History:  Diagnosis Date  . Acne rosacea   . Anxiety   . Asthma   . Atrial fibrillation and flutter ([place]) 09/2014   Revereted from Afib RVR to 2:1 A Flutter -- TEE/ DCCV 2/9; on Amiodarone  . 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 Mod LA dilation, normal PA pressures   . CVA (cerebral infarction)    h/o superior  cerebellar infarct  . Depression   . Digoxin toxicity 09/2014  . 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 Diverticulosis   . H/O: rheumatic fever    Childhood  . HYPERLIPIDEMIA 04/05/2010   Qualifier: Diagnosis of  By: Lorelei Pont MD, Frederico Hamman    . Hypothyroid    On replacement therapy; normal TSH 09/2014  . Migraine headache   . Osteopenia   . Paroxysmal atrial fibrillation ([place]) 2005; Recurrent 09/2014   a) 2005: s/p Cox Maze & LAA ligation; b) 09/2014: TEE-cardioversion  . Rheumatic mitral and aortic valve insufficiency 2005   a) s/p MV Ring repair; with Cox-Maze for Afib; b) Echo 2013: EF 60-65%,no Regional WMA, Gr 2 DD, no Sig MR ;; c) TEE 10/07/2014: Severlely thickened and calcified MV leaflets. Posterior MV leaflet is fixed and immobile. MAC. reduced excursion of the anterior MV leaflet. Mean MV gradient was 41m Hg and MVA calculated at cm2.    . Urinary incontinence    Surgical Hx:  Past Surgical History:  Procedure Laterality Date  . ABDOMINAL HYSTERECTOMY  1980s   (no cancer),ovaries present  . CARDIAC CATHETERIZATION  Jan 2005   Pre-op R&LHC -- Nonobstructive CAD; normal PA pressures  . CARDIOVERSION  06/04/2012   Procedure: CARDIOVERSION;  Surgeon: JCarlena Bjornstad MD;  [place] [facility]  Service: Cardiovascular;  Laterality: N/A;  . CARDIOVERSION N/A 10/07/2014   Procedure: CARDIOVERSION;  Surgeon: TSueanne Margarita MD;  [facility];  Service: Cardiovascular;  Laterality: N/A;  . LUMBAR DISC SURGERY     x 2 distantly  . MITRAL VALVE REPAIR  2005   with [facility] Maze for [employer] . NM MYOVIEW LTD  4/7/'16   EF 37% with septal hypokinesis and diffuse hypokinesis. No ischemia or infarction. 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  .Marland KitchenRIGHT HEART CATHETERIZATION N/A 10/03/2014   Procedure: RIGHT HEART CATH;  Surgeon: DJolaine Artist MD;  [facility];  Service: Cardiovascular;  RAP 531mg, RVP 35/2/9 mmHg, PAP  45/12 mmHg, PCWP 16 mmHg; CO/I by Fick: 3.9/2.3; Ao/PA/SVC SaO2%: 97%/63%/65%.  . TEE WITHOUT CARDIOVERSION N/A 10/07/2014   Procedure: TRANSESOPHAGEAL ECHOCARDIOGRAM (TEE);  Surgeon: TrSueanne MargaritaMD;  [facility];  Service: Cardiovascular;  Laterality: N/A;  . THYROIDECTOMY, PARTIAL     partial-no cancer; now on thyroid replacement  . TRANSTHORACIC ECHOCARDIOGRAM  2013; 2/'16 & 5/'16   a) 2013: EF 60-65%, mild MR, Gr 2 DD; [place] - severel global LV dysfunction (systolic & diastolic) - dilated LV & restrictive filling pattern - Gr 3 DD, mod reduced RV function, severely dilated LA - 6.4 cm, mod dilated RA, mod pl effusion, mod MR, mild TR; c) 5/10/'16:  EF 25-30%, mod LV dilation, no RWMA,Gr 3 DD w/ high LAP, MV sewing ring intact w/ Mod MR, Severe LA dilation  . TRANSTHORACIC ECHOCARDIOGRAM  January 2017:   EF improved to 45-50%. Mild diffuse HK. Mild MR. Moderate LA dilation. Normal PA pressures.   Family Hx:  Family History  Problem Relation Age of Onset  . Diabetes Mother   .  Coronary artery disease Mother   . Kidney disease Mother   . Breast cancer Neg Hx    Social Hx:   Social History  Substance Use Topics  . Smoking status: Never Smoker  . Smokeless tobacco: Never Used  . Alcohol use No   Medication:   Reviewed.     Allergies:  Patient has no known allergies.  Review of Systems: Gen:  Denies  fever, sweats, chills HEENT: Denies blurred vision, double vision. bleeds, sore throat Cvc:  No dizziness, chest pain. Resp:   Denies cough or sputum production, shortness of breath Gi: Denies swallowing difficulty, stomach pain. Gu:  Denies bladder incontinence, burning urine Ext:   No Joint pain, stiffness. Skin: No skin rash,  hives  Endoc:  No polyuria, polydipsia. Psych: No depression, insomnia. Other:  All other systems were reviewed with the patient and were negative other that what is mentioned in the HPI.   Physical Examination:   VS:  BP 130/82 (BP Location: Left Arm, Cuff Size: Normal)   Pulse (!) 54   Wt 161 lb (73 kg)   SpO2 98%   BMI 23.78 kg/m   General Appearance: No distress  Neuro:without focal findings,  speech normal,  HEENT: PERRLA, EOM intact.   Pulmonary: normal breath sounds, No wheezing.  CardiovascularNormal S1,S2.  No m/r/g.   Abdomen: Benign, Soft, non-tender. Renal:  No costovertebral tenderness  GU:  No performed at this time. Endoc: No evident thyromegaly, no signs of acromegaly. Skin:   warm, no rashes, no ecchymosis  Extremities: normal, no cyanosis, clubbing.  Other findings:    LABORATORY PANEL:   CBC No results for input(s): WBC, HGB, HCT, PLT in the last 168 hours. ------------------------------------------------------------------------------------------------------------------  Chemistries  No results for input(s): NA, K, CL, CO2, GLUCOSE, BUN, CREATININE, CALCIUM, MG, AST, ALT, ALKPHOS, BILITOT in the last 168 hours.  Invalid input(s): GFRCGP ------------------------------------------------------------------------------------------------------------------  Cardiac Enzymes No results for input(s): TROPONINI in the last 168 hours. ------------------------------------------------------------  RADIOLOGY:  No results found.     Thank  you for the consultation and for allowing [facility] to assist in the care of your patient. Our recommendations are noted above.  Please contact us if we can be of further service.   Marda Stalker, MD.  Board Certified in Internal Medicine, Pulmonary Medicine, [place], and Sleep Medicine.  [facility] Office Number: [phone]  Patricia Pesa, M.D.  Vilinda Boehringer, M.D.  Merton Border, M.D  11/05/2016

## 2016-11-07 ENCOUNTER — Encounter: Payer: Self-pay | Admitting: Internal Medicine

## 2016-11-07 ENCOUNTER — Ambulatory Visit (INDEPENDENT_AMBULATORY_CARE_PROVIDER_SITE_OTHER): Payer: Medicare HMO | Admitting: Internal Medicine

## 2016-11-07 VITALS — BP 130/82 | HR 54 | Wt 161.0 lb

## 2016-11-07 DIAGNOSIS — J438 Other emphysema: Secondary | ICD-10-CM

## 2016-11-07 DIAGNOSIS — R69 Illness, unspecified: Secondary | ICD-10-CM | POA: Diagnosis not present

## 2016-11-07 DIAGNOSIS — F5101 Primary insomnia: Secondary | ICD-10-CM

## 2016-11-07 NOTE — Patient Instructions (Signed)
--  Continue to be active.   --Will give a sample of REM fresh today, if it helps it may be purchased over the counter (it is similar to melatonin).   --IF your breathing declines before next PFT call us back for an earlier appt, otherwise we will repeat Lung function test and see you back in 1 year.

## 2016-11-08 ENCOUNTER — Ambulatory Visit: Payer: Medicare HMO | Admitting: Sports Medicine

## 2016-11-15 ENCOUNTER — Ambulatory Visit: Payer: Medicare HMO | Admitting: Sports Medicine

## 2016-11-16 ENCOUNTER — Ambulatory Visit (INDEPENDENT_AMBULATORY_CARE_PROVIDER_SITE_OTHER): Payer: Medicare HMO

## 2016-11-16 DIAGNOSIS — I480 Paroxysmal atrial fibrillation: Secondary | ICD-10-CM

## 2016-11-16 DIAGNOSIS — I4891 Unspecified atrial fibrillation: Secondary | ICD-10-CM

## 2016-11-16 DIAGNOSIS — Z5181 Encounter for therapeutic drug level monitoring: Secondary | ICD-10-CM | POA: Diagnosis not present

## 2016-11-16 DIAGNOSIS — Z7901 Long term (current) use of anticoagulants: Secondary | ICD-10-CM | POA: Diagnosis not present

## 2016-11-16 DIAGNOSIS — I4892 Unspecified atrial flutter: Secondary | ICD-10-CM | POA: Diagnosis not present

## 2016-11-16 LAB — POCT INR: INR: 3

## 2016-12-02 DIAGNOSIS — M62830 Muscle spasm of back: Secondary | ICD-10-CM | POA: Diagnosis not present

## 2016-12-02 DIAGNOSIS — M9901 Segmental and somatic dysfunction of cervical region: Secondary | ICD-10-CM | POA: Diagnosis not present

## 2016-12-02 DIAGNOSIS — M9902 Segmental and somatic dysfunction of thoracic region: Secondary | ICD-10-CM | POA: Diagnosis not present

## 2016-12-02 DIAGNOSIS — M5412 Radiculopathy, cervical region: Secondary | ICD-10-CM | POA: Diagnosis not present

## 2016-12-09 DIAGNOSIS — Z791 Long term (current) use of non-steroidal anti-inflammatories (NSAID): Secondary | ICD-10-CM | POA: Diagnosis not present

## 2016-12-09 DIAGNOSIS — R011 Cardiac murmur, unspecified: Secondary | ICD-10-CM | POA: Diagnosis not present

## 2016-12-09 DIAGNOSIS — Z79899 Other long term (current) drug therapy: Secondary | ICD-10-CM | POA: Diagnosis not present

## 2016-12-09 DIAGNOSIS — E876 Hypokalemia: Secondary | ICD-10-CM | POA: Diagnosis not present

## 2016-12-09 DIAGNOSIS — H9113 Presbycusis, bilateral: Secondary | ICD-10-CM | POA: Diagnosis not present

## 2016-12-09 DIAGNOSIS — E039 Hypothyroidism, unspecified: Secondary | ICD-10-CM | POA: Diagnosis not present

## 2016-12-09 DIAGNOSIS — M130 Polyarthritis, unspecified: Secondary | ICD-10-CM | POA: Diagnosis not present

## 2016-12-09 DIAGNOSIS — I4892 Unspecified atrial flutter: Secondary | ICD-10-CM | POA: Diagnosis not present

## 2016-12-09 DIAGNOSIS — M47819 Spondylosis without myelopathy or radiculopathy, site unspecified: Secondary | ICD-10-CM | POA: Diagnosis not present

## 2016-12-09 DIAGNOSIS — I10 Essential (primary) hypertension: Secondary | ICD-10-CM | POA: Diagnosis not present

## 2016-12-09 DIAGNOSIS — Z974 Presence of external hearing-aid: Secondary | ICD-10-CM | POA: Diagnosis not present

## 2016-12-09 DIAGNOSIS — Z0000 Encounter for general adult medical examination without abnormal findings: Secondary | ICD-10-CM | POA: Diagnosis not present

## 2016-12-09 DIAGNOSIS — G4700 Insomnia, unspecified: Secondary | ICD-10-CM | POA: Diagnosis not present

## 2016-12-14 ENCOUNTER — Ambulatory Visit (INDEPENDENT_AMBULATORY_CARE_PROVIDER_SITE_OTHER): Payer: Medicare HMO

## 2016-12-14 DIAGNOSIS — Z7901 Long term (current) use of anticoagulants: Secondary | ICD-10-CM | POA: Diagnosis not present

## 2016-12-14 DIAGNOSIS — I4891 Unspecified atrial fibrillation: Secondary | ICD-10-CM | POA: Diagnosis not present

## 2016-12-14 DIAGNOSIS — I4892 Unspecified atrial flutter: Secondary | ICD-10-CM

## 2016-12-14 DIAGNOSIS — Z5181 Encounter for therapeutic drug level monitoring: Secondary | ICD-10-CM

## 2016-12-14 DIAGNOSIS — I480 Paroxysmal atrial fibrillation: Secondary | ICD-10-CM | POA: Diagnosis not present

## 2016-12-14 LAB — POCT INR: INR: 1.8

## 2016-12-26 ENCOUNTER — Other Ambulatory Visit: Payer: Self-pay | Admitting: Family Medicine

## 2016-12-26 ENCOUNTER — Other Ambulatory Visit: Payer: Self-pay | Admitting: Cardiology

## 2016-12-27 DIAGNOSIS — L821 Other seborrheic keratosis: Secondary | ICD-10-CM | POA: Diagnosis not present

## 2016-12-27 DIAGNOSIS — L578 Other skin changes due to chronic exposure to nonionizing radiation: Secondary | ICD-10-CM | POA: Diagnosis not present

## 2016-12-27 DIAGNOSIS — D229 Melanocytic nevi, unspecified: Secondary | ICD-10-CM | POA: Diagnosis not present

## 2016-12-27 DIAGNOSIS — T07XXXA Unspecified multiple injuries, initial encounter: Secondary | ICD-10-CM | POA: Diagnosis not present

## 2016-12-27 DIAGNOSIS — L918 Other hypertrophic disorders of the skin: Secondary | ICD-10-CM | POA: Diagnosis not present

## 2016-12-27 DIAGNOSIS — L718 Other rosacea: Secondary | ICD-10-CM | POA: Diagnosis not present

## 2016-12-27 DIAGNOSIS — L853 Xerosis cutis: Secondary | ICD-10-CM | POA: Diagnosis not present

## 2016-12-27 DIAGNOSIS — D1800 Hemangioma unspecified site: Secondary | ICD-10-CM | POA: Diagnosis not present

## 2016-12-27 DIAGNOSIS — L820 Inflamed seborrheic keratosis: Secondary | ICD-10-CM | POA: Diagnosis not present

## 2016-12-27 DIAGNOSIS — Z1283 Encounter for screening for malignant neoplasm of skin: Secondary | ICD-10-CM | POA: Diagnosis not present

## 2017-01-04 ENCOUNTER — Ambulatory Visit (INDEPENDENT_AMBULATORY_CARE_PROVIDER_SITE_OTHER): Payer: Medicare HMO

## 2017-01-04 DIAGNOSIS — I480 Paroxysmal atrial fibrillation: Secondary | ICD-10-CM

## 2017-01-04 DIAGNOSIS — I4892 Unspecified atrial flutter: Secondary | ICD-10-CM | POA: Diagnosis not present

## 2017-01-04 DIAGNOSIS — Z5181 Encounter for therapeutic drug level monitoring: Secondary | ICD-10-CM | POA: Diagnosis not present

## 2017-01-04 DIAGNOSIS — Z7901 Long term (current) use of anticoagulants: Secondary | ICD-10-CM

## 2017-01-04 DIAGNOSIS — I4891 Unspecified atrial fibrillation: Secondary | ICD-10-CM | POA: Diagnosis not present

## 2017-01-04 LAB — POCT INR: INR: 1.6

## 2017-01-18 ENCOUNTER — Ambulatory Visit (INDEPENDENT_AMBULATORY_CARE_PROVIDER_SITE_OTHER): Payer: Medicare HMO

## 2017-01-18 DIAGNOSIS — I4891 Unspecified atrial fibrillation: Secondary | ICD-10-CM | POA: Diagnosis not present

## 2017-01-18 DIAGNOSIS — Z7901 Long term (current) use of anticoagulants: Secondary | ICD-10-CM | POA: Diagnosis not present

## 2017-01-18 DIAGNOSIS — I4892 Unspecified atrial flutter: Secondary | ICD-10-CM | POA: Diagnosis not present

## 2017-01-18 DIAGNOSIS — I480 Paroxysmal atrial fibrillation: Secondary | ICD-10-CM | POA: Diagnosis not present

## 2017-01-18 DIAGNOSIS — Z5181 Encounter for therapeutic drug level monitoring: Secondary | ICD-10-CM | POA: Diagnosis not present

## 2017-01-18 LAB — POCT INR: INR: 1.9

## 2017-01-20 ENCOUNTER — Other Ambulatory Visit: Payer: Self-pay | Admitting: Internal Medicine

## 2017-02-01 ENCOUNTER — Ambulatory Visit (INDEPENDENT_AMBULATORY_CARE_PROVIDER_SITE_OTHER): Payer: Medicare HMO | Admitting: *Deleted

## 2017-02-01 DIAGNOSIS — Z7901 Long term (current) use of anticoagulants: Secondary | ICD-10-CM

## 2017-02-01 DIAGNOSIS — I4891 Unspecified atrial fibrillation: Secondary | ICD-10-CM

## 2017-02-01 DIAGNOSIS — Z5181 Encounter for therapeutic drug level monitoring: Secondary | ICD-10-CM

## 2017-02-01 DIAGNOSIS — I480 Paroxysmal atrial fibrillation: Secondary | ICD-10-CM

## 2017-02-01 DIAGNOSIS — I4892 Unspecified atrial flutter: Secondary | ICD-10-CM | POA: Diagnosis not present

## 2017-02-01 LAB — POCT INR: INR: 2

## 2017-02-20 ENCOUNTER — Other Ambulatory Visit: Payer: Self-pay | Admitting: Family Medicine

## 2017-02-21 ENCOUNTER — Telehealth: Payer: Self-pay | Admitting: *Deleted

## 2017-02-21 NOTE — Telephone Encounter (Signed)
Spoke to pt who states she is currently on losartan, but read that it could cause back pain, which she has been experiencing "pretty steady for several months." Pt is wanting to know if there is anything else that can be prescribed, with less side effects. Pls advise

## 2017-02-22 ENCOUNTER — Ambulatory Visit (INDEPENDENT_AMBULATORY_CARE_PROVIDER_SITE_OTHER): Payer: Medicare HMO

## 2017-02-22 DIAGNOSIS — Z5181 Encounter for therapeutic drug level monitoring: Secondary | ICD-10-CM

## 2017-02-22 DIAGNOSIS — Z7901 Long term (current) use of anticoagulants: Secondary | ICD-10-CM | POA: Diagnosis not present

## 2017-02-22 DIAGNOSIS — I4892 Unspecified atrial flutter: Secondary | ICD-10-CM | POA: Diagnosis not present

## 2017-02-22 DIAGNOSIS — I480 Paroxysmal atrial fibrillation: Secondary | ICD-10-CM | POA: Diagnosis not present

## 2017-02-22 DIAGNOSIS — I4891 Unspecified atrial fibrillation: Secondary | ICD-10-CM | POA: Diagnosis not present

## 2017-02-22 LAB — POCT INR: INR: 2.1

## 2017-02-22 NOTE — Telephone Encounter (Signed)
Notified pt of Dr. Lillie Fragmin comments and she verbalized understanding, pt said since back pain isn't coming from med she scheduled an appt with Dr. Lorelei Pont (1st available appt.) on 03/02/17 for him to eval her back pain, pt declined to schedule an appt earlier with another provider she wants to wait and see Dr. Lorelei Pont  *FYI to Dr. Lorelei Pont

## 2017-02-22 NOTE — Telephone Encounter (Signed)
No. This medicine is for her heart failure. I really doubt it is causing back pain, and it definitely helps heart failure.

## 2017-03-02 ENCOUNTER — Ambulatory Visit: Payer: Medicare HMO | Admitting: Family Medicine

## 2017-03-22 ENCOUNTER — Ambulatory Visit (INDEPENDENT_AMBULATORY_CARE_PROVIDER_SITE_OTHER): Payer: Medicare HMO

## 2017-03-22 DIAGNOSIS — I4892 Unspecified atrial flutter: Secondary | ICD-10-CM | POA: Diagnosis not present

## 2017-03-22 DIAGNOSIS — Z7901 Long term (current) use of anticoagulants: Secondary | ICD-10-CM

## 2017-03-22 DIAGNOSIS — I480 Paroxysmal atrial fibrillation: Secondary | ICD-10-CM

## 2017-03-22 DIAGNOSIS — I4891 Unspecified atrial fibrillation: Secondary | ICD-10-CM

## 2017-03-22 DIAGNOSIS — Z5181 Encounter for therapeutic drug level monitoring: Secondary | ICD-10-CM | POA: Diagnosis not present

## 2017-03-22 LAB — POCT INR: INR: 2.9

## 2017-04-06 ENCOUNTER — Other Ambulatory Visit: Payer: Self-pay | Admitting: Cardiology

## 2017-04-18 ENCOUNTER — Other Ambulatory Visit: Payer: Self-pay | Admitting: Family Medicine

## 2017-04-18 ENCOUNTER — Other Ambulatory Visit: Payer: Self-pay | Admitting: Internal Medicine

## 2017-04-19 ENCOUNTER — Ambulatory Visit (INDEPENDENT_AMBULATORY_CARE_PROVIDER_SITE_OTHER): Payer: Medicare HMO | Admitting: *Deleted

## 2017-04-19 DIAGNOSIS — I4892 Unspecified atrial flutter: Secondary | ICD-10-CM

## 2017-04-19 DIAGNOSIS — I480 Paroxysmal atrial fibrillation: Secondary | ICD-10-CM | POA: Diagnosis not present

## 2017-04-19 DIAGNOSIS — I4891 Unspecified atrial fibrillation: Secondary | ICD-10-CM | POA: Diagnosis not present

## 2017-04-19 DIAGNOSIS — Z7901 Long term (current) use of anticoagulants: Secondary | ICD-10-CM | POA: Diagnosis not present

## 2017-04-19 DIAGNOSIS — Z5181 Encounter for therapeutic drug level monitoring: Secondary | ICD-10-CM | POA: Diagnosis not present

## 2017-04-19 LAB — POCT INR: INR: 5.3

## 2017-04-26 ENCOUNTER — Ambulatory Visit (INDEPENDENT_AMBULATORY_CARE_PROVIDER_SITE_OTHER): Payer: Medicare HMO

## 2017-04-26 DIAGNOSIS — Z7901 Long term (current) use of anticoagulants: Secondary | ICD-10-CM | POA: Diagnosis not present

## 2017-04-26 DIAGNOSIS — I480 Paroxysmal atrial fibrillation: Secondary | ICD-10-CM | POA: Diagnosis not present

## 2017-04-26 DIAGNOSIS — I4891 Unspecified atrial fibrillation: Secondary | ICD-10-CM

## 2017-04-26 DIAGNOSIS — I4892 Unspecified atrial flutter: Secondary | ICD-10-CM

## 2017-04-26 DIAGNOSIS — Z5181 Encounter for therapeutic drug level monitoring: Secondary | ICD-10-CM | POA: Diagnosis not present

## 2017-04-26 LAB — POCT INR: INR: 1.8

## 2017-05-10 ENCOUNTER — Ambulatory Visit (INDEPENDENT_AMBULATORY_CARE_PROVIDER_SITE_OTHER): Payer: Medicare HMO

## 2017-05-10 DIAGNOSIS — Z7901 Long term (current) use of anticoagulants: Secondary | ICD-10-CM

## 2017-05-10 DIAGNOSIS — I4891 Unspecified atrial fibrillation: Secondary | ICD-10-CM | POA: Diagnosis not present

## 2017-05-10 DIAGNOSIS — I480 Paroxysmal atrial fibrillation: Secondary | ICD-10-CM

## 2017-05-10 DIAGNOSIS — I4892 Unspecified atrial flutter: Secondary | ICD-10-CM

## 2017-05-10 DIAGNOSIS — Z5181 Encounter for therapeutic drug level monitoring: Secondary | ICD-10-CM | POA: Diagnosis not present

## 2017-05-10 LAB — POCT INR: INR: 3.2

## 2017-05-11 ENCOUNTER — Other Ambulatory Visit: Payer: Self-pay | Admitting: Family Medicine

## 2017-05-16 ENCOUNTER — Ambulatory Visit (INDEPENDENT_AMBULATORY_CARE_PROVIDER_SITE_OTHER): Payer: Medicare HMO

## 2017-05-16 VITALS — BP 134/70 | HR 50 | Temp 98.3°F | Ht 67.5 in | Wt 163.2 lb

## 2017-05-16 DIAGNOSIS — Z23 Encounter for immunization: Secondary | ICD-10-CM

## 2017-05-16 DIAGNOSIS — E2839 Other primary ovarian failure: Secondary | ICD-10-CM

## 2017-05-16 DIAGNOSIS — Z0000 Encounter for general adult medical examination without abnormal findings: Secondary | ICD-10-CM | POA: Diagnosis not present

## 2017-05-16 DIAGNOSIS — E785 Hyperlipidemia, unspecified: Secondary | ICD-10-CM | POA: Diagnosis not present

## 2017-05-16 DIAGNOSIS — E039 Hypothyroidism, unspecified: Secondary | ICD-10-CM | POA: Diagnosis not present

## 2017-05-16 DIAGNOSIS — Z1231 Encounter for screening mammogram for malignant neoplasm of breast: Secondary | ICD-10-CM | POA: Diagnosis not present

## 2017-05-16 DIAGNOSIS — E559 Vitamin D deficiency, unspecified: Secondary | ICD-10-CM

## 2017-05-16 DIAGNOSIS — Z1239 Encounter for other screening for malignant neoplasm of breast: Secondary | ICD-10-CM

## 2017-05-16 LAB — CBC WITH DIFFERENTIAL/PLATELET
BASOS ABS: 0.1 10*3/uL (ref 0.0–0.1)
Basophils Relative: 1 % (ref 0.0–3.0)
EOS ABS: 0.2 10*3/uL (ref 0.0–0.7)
Eosinophils Relative: 3.8 % (ref 0.0–5.0)
HEMATOCRIT: 41.3 % (ref 36.0–46.0)
HEMOGLOBIN: 13.7 g/dL (ref 12.0–15.0)
LYMPHS PCT: 30.3 % (ref 12.0–46.0)
Lymphs Abs: 1.5 10*3/uL (ref 0.7–4.0)
MCHC: 33.1 g/dL (ref 30.0–36.0)
MCV: 103.1 fl — ABNORMAL HIGH (ref 78.0–100.0)
Monocytes Absolute: 0.3 10*3/uL (ref 0.1–1.0)
Monocytes Relative: 6.8 % (ref 3.0–12.0)
Neutro Abs: 2.8 10*3/uL (ref 1.4–7.7)
Neutrophils Relative %: 58.1 % (ref 43.0–77.0)
Platelets: 189 10*3/uL (ref 150.0–400.0)
RBC: 4 Mil/uL (ref 3.87–5.11)
RDW: 14.4 % (ref 11.5–15.5)
WBC: 4.8 10*3/uL (ref 4.0–10.5)

## 2017-05-16 LAB — LIPID PANEL
CHOLESTEROL: 207 mg/dL — AB (ref 0–200)
HDL: 77.4 mg/dL (ref >39.00)
LDL Cholesterol: 107 mg/dL — ABNORMAL HIGH (ref 0–99)
NonHDL: 129.78
TRIGLYCERIDES: 116 mg/dL (ref 0.0–149.0)
Total CHOL/HDL Ratio: 3
VLDL: 23.2 mg/dL (ref 0.0–40.0)

## 2017-05-16 LAB — BASIC METABOLIC PANEL
BUN: 23 mg/dL (ref 6–23)
CHLORIDE: 103 meq/L (ref 96–112)
CO2: 30 mEq/L (ref 19–32)
CREATININE: 0.85 mg/dL (ref 0.40–1.20)
Calcium: 9.8 mg/dL (ref 8.4–10.5)
GFR: 69.75 mL/min (ref >60.00)
Glucose, Bld: 103 mg/dL — ABNORMAL HIGH (ref 70–99)
Potassium: 5.7 mEq/L — ABNORMAL HIGH (ref 3.5–5.1)
Sodium: 138 mEq/L (ref 135–145)

## 2017-05-16 LAB — T4, FREE: Free T4: 1.01 ng/dL (ref 0.60–1.60)

## 2017-05-16 LAB — HEPATIC FUNCTION PANEL
ALK PHOS: 78 U/L (ref 39–117)
ALT: 23 U/L (ref 0–35)
AST: 21 U/L (ref 0–37)
Albumin: 4.5 g/dL (ref 3.5–5.2)
BILIRUBIN DIRECT: 0.1 mg/dL (ref 0.0–0.3)
TOTAL PROTEIN: 7.2 g/dL (ref 6.0–8.3)
Total Bilirubin: 0.6 mg/dL (ref 0.2–1.2)

## 2017-05-16 LAB — VITAMIN D 25 HYDROXY (VIT D DEFICIENCY, FRACTURES): VITD: 24.48 ng/mL — ABNORMAL LOW (ref 30.00–100.00)

## 2017-05-16 LAB — TSH: TSH: 6.21 u[IU]/mL — AB (ref 0.35–4.50)

## 2017-05-16 LAB — T3, FREE: T3, Free: 2.6 pg/mL (ref 2.3–4.2)

## 2017-05-16 NOTE — Progress Notes (Signed)
PCP notes:   Health maintenance:  Flu vaccine - administered Colonoscopy - addressed; patient was provided contact info for [facility] to schedule appt Tetanus vaccine - postponed/insurance  Abnormal screenings:   Depression score: 2  Patient concerns:   Shingrix - PCP please discuss at next appt  Mobility - patient states she is having difficulty with standing from a sitting position  Sleep - patient states she is having difficulty getting a good night's rest  Urinary incontinence - patient states this has become a major concern  Nurse concerns:  Mammogram and bone density orders generated per patient request.   Next PCP appt:   05/24/17 @ 1030

## 2017-05-16 NOTE — Progress Notes (Signed)
Subjective:   [name] is a 71 y.o. female who presents for Medicare Annual (Subsequent) preventive examination.  Review of Systems:  N/A Cardiac Risk Factors include: advanced age (>52mn, >>51women);dyslipidemia     Objective:     Vitals: BP 134/70 (BP Location: Right Arm, Patient Position: Sitting, Cuff Size: Normal)   Pulse (!) 50   Temp 98.3 F (36.8 C) (Oral)   Ht 5' 7.5" (1.715 m) Comment: no shoes  Wt 163 lb 4 oz (74 kg)   SpO2 98%   BMI 25.19 kg/m   Body mass index is 25.19 kg/m.   Tobacco History  Smoking Status  . Never Smoker  Smokeless Tobacco  . Never Used     Counseling given: No   Past Medical History:  Diagnosis Date  . Acne rosacea   . Anxiety   . Asthma   . [place]   Revereted from Afib RVR to 2:1 A Flutter -- TEE/ DCCV 2/9; on Amiodarone  . 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 Mod LA dilation, normal PA pressures   . CVA (cerebral infarction)    h/o superior cerebellar infarct  . Depression   . Digoxin toxicity 09/2014  . 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 [place] 09/2014   a) [facility] Echo: EF ~10% Severe LV dilation & global LV Systolic dysfxn, restrictive filling pattern - Gr 3 DD, mod RV dysfxn, severe LA dilation - 6.4 cm, mod RA dil, mod pl effusion, mod MR, mild TR; b) TEE 10/07/14: EF ~10%, Severe LV dilation & dysfxn Mod RV dysfxn, LAA oversewn, Mod RA & TR  . Diverticulosis   . H/O: rheumatic fever    Childhood  . HYPERLIPIDEMIA 04/05/2010   Qualifier: Diagnosis of  By: CLorelei PontMD, SFrederico Hamman   . Hypothyroid    On replacement therapy; normal TSH 09/2014  . Migraine headache   . Osteopenia   . [place]; Recurrent 09/2014     a) 2005: s/p Cox Maze & LAA ligation; b) 09/2014: TEE-cardioversion  . Rheumatic mitral and aortic valve insufficiency 2005   a) s/p MV Ring repair; with Cox-Maze for Afib; b) Echo 2013: EF 60-65%,no Regional WMA, Gr 2 DD, no Sig MR ;; c) TEE 10/07/2014: Severlely thickened and calcified MV leaflets. Posterior MV leaflet is fixed and immobile. MAC. reduced excursion of the anterior MV leaflet. Mean MV gradient was 31mHg and MVA calculated at cm2.    . Urinary incontinence    Past Surgical History:  Procedure Laterality Date  . ABDOMINAL HYSTERECTOMY  1980s   (no cancer),ovaries present  . CARDIAC CATHETERIZATION  Jan 2005   Pre-op R&LHC -- Nonobstructive CAD; normal PA pressures  . CARDIOVERSION  06/04/2012   Procedure: CARDIOVERSION;  Surgeon: JeCarlena BjornstadMD;  [facility]: [facility];  Laterality: N/A;  . CARDIOVERSION N/A 10/07/2014   Procedure: CARDIOVERSION;  Surgeon: TrSueanne MargaritaMD;  [facility];  Service: Cardiovascular;  Laterality: N/A;  . LUMBAR DISC SURGERY     x 2 distantly  . MITRAL VALVE REPAIR  2005   with Cox Maze for [employer]. NM MYOVIEW LTD  4/7/'16   EF 37% with septal hypokinesis and diffuse hypokinesis. No ischemia or infarction.  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  Marland Kitchen RIGHT HEART CATHETERIZATION N/A 10/03/2014   Procedure: RIGHT HEART CATH;  Surgeon: Jolaine Artist, MD;  [facility];  Service: Cardiovascular;  RAP 65mHg, RVP 35/2/9 mmHg, PAP 45/12 mmHg, PCWP 16 mmHg; CO/I by Fick: 3.9/2.3; Ao/PA/SVC SaO2%: 97%/63%/65%.  . TEE WITHOUT CARDIOVERSION N/A 10/07/2014   Procedure: TRANSESOPHAGEAL ECHOCARDIOGRAM (TEE);  Surgeon: TSueanne Margarita MD;  [facility];  Service: Cardiovascular;  Laterality: N/A;  . THYROIDECTOMY, PARTIAL     partial-no cancer; now on thyroid replacement  . TRANSTHORACIC ECHOCARDIOGRAM  2013; 2/'16 & 5/'16   a) 2013: EF  60-65%, mild MR, Gr 2 DD; b[place] ~10% - severel global LV dysfunction (systolic & diastolic) - dilated LV & restrictive filling pattern - Gr 3 DD, mod reduced RV function, severely dilated LA - 6.4 cm, mod dilated RA, mod pl effusion, mod MR, mild TR; c) 5/10/'16:  EF 25-30%, mod LV dilation, no RWMA,Gr 3 DD w/ high LAP, MV sewing ring intact w/ Mod MR, Severe LA dilation  . TRANSTHORACIC ECHOCARDIOGRAM  January 2017:   EF improved to 45-50%. Mild diffuse HK. Mild MR. Moderate LA dilation. Normal PA pressures.   Family History  Problem Relation Age of Onset  . Diabetes Mother   . Coronary artery disease Mother   . Kidney disease Mother   . Breast cancer Neg Hx    History  Sexual Activity  . Sexual activity: Yes    Outpatient Encounter Prescriptions as of 05/16/2017  Medication Sig  . acetaminophen (TYLENOL) 325 MG tablet Take 2 tablets (650 mg total) by mouth every 4 (four) hours as needed for mild pain, fever or headache.  .Marland Kitchenamiodarone (PACERONE) 200 MG tablet Take 1 tablet (200 mg total) by mouth daily.  . Ascorbic Acid (VITAMIN C PO) Take by mouth.  .Marland KitchenBIOTIN PO Take by mouth.  . Calcium Citrate-Vitamin D (CALCIUM + D PO) Take 2 capsules by mouth daily.  . carvedilol (COREG) 3.125 MG tablet TAKE ONE TABLET BY MOUTH TWICE DAILY  . Cholecalciferol (VITAMIN D PO) Take by mouth.  . CRANBERRY PO Take by mouth.  . Cyanocobalamin (VITAMIN B-12 PO) Take 1 tablet by mouth daily.  .Marland Kitchendonepezil (ARICEPT) 5 MG tablet TAKE ONE TABLET BY MOUTH AT BEDTIME  . DOXYLAMINE SUCCINATE PO Take by mouth at bedtime as needed.  . IRON PO Take by mouth.  . IRON, FERROUS SULFATE, PO Take 1 tablet by mouth daily.  .Marland Kitchenlevothyroxine (SYNTHROID, LEVOTHROID) 25 MCG tablet TAKE TWO AND ONE-HALF TABLETS BY MOUTH DAILY BEFORE BREAKFAST  . losartan (COZAAR) 25 MG tablet TAKE ONE TABLET BY MOUTH EVERY DAY  . MAGNESIUM PO Take by mouth.  . Multiple Vitamins-Minerals (PRESERVISION AREDS 2 PO) Take by mouth.    . Omega-3 Fatty Acids (FISH OIL PO) Take 1 capsule by mouth daily.  . potassium chloride SA (K-DUR,KLOR-CON) 20 MEQ tablet Take 20 mEq by mouth daily.   .Marland KitchenPOTASSIUM PO Take by mouth.  . warfarin (COUMADIN) 5 MG tablet Take 1 to 1 and 1/2 tablets every day as directed by coumadin clinic  . benzonatate (TESSALON) 200 MG capsule Take 1 capsule (200 mg total) by mouth 3 (three) times daily as needed for cough. Swallow whole-do not bite pill (Patient not taking: Reported on 05/16/2017)   No facility-administered encounter medications on file as of 05/16/2017.     Activities of Daily Living  In your present state of health, do you have any difficulty performing the following activities: 05/16/2017  Hearing? Y  Vision? N  Difficulty concentrating or making decisions? Y  Walking or climbing stairs? Y  Dressing or bathing? N  Doing errands, shopping? N  Preparing Food and eating ? N  Using the Toilet? N  In the past six months, have you accidently leaked urine? Y  Do you have problems with loss of bowel control? N  Managing your Medications? N  Managing your Finances? N  Housekeeping or managing your Housekeeping? N  Some recent data might be hidden    Patient Care Team: Owens Loffler, MD as PCP - General (Family Medicine)    Assessment:    Hearing Screening Comments: Bilateral hearing aids Vision Screening Comments: Last vision exam in 2018 with Dr. Chong Sicilian  Exercise Activities and Dietary recommendations Current Exercise Habits: The patient does not participate in regular exercise at present, Exercise limited by: orthopedic condition(s)  Goals    . Increase water intake          Starting 05/16/2017, I will continue to drink at least 6-8 glasses of water daily.       Fall Risk Fall Risk  05/16/2017 03/04/2016  Falls in the past year? No Yes  Number falls in past yr: - 1  Injury with Fall? - Yes  Follow up - Falls evaluation completed   Depression Screen PHQ 2/9 Scores  05/16/2017 03/04/2016  PHQ - 2 Score 0 0  PHQ- 9 Score 2 -     Cognitive Function MMSE - Mini Mental State Exam 05/16/2017 03/04/2016  Orientation to time 5 5  Orientation to Place 5 5  Registration 3 3  Attention/ Calculation 0 0  Recall 3 2  Recall-comments - pt was unable to recall 1 of 3 words  Language- name 2 objects 0 0  Language- repeat 1 1  Language- follow 3 step command 3 3  Language- read & follow direction 0 0  Write a sentence 0 0  Copy design 0 0  Total score 20 19       PLEASE NOTE: A Mini-Cog screen was completed. Maximum score is 20. A value of 0 denotes this part of [place] MMSE was not completed or the patient failed this part of the Mini-Cog screening.   Mini-Cog Screening Orientation to Time - Max 5 pts Orientation to Place - Max 5 pts Registration - Max 3 pts Recall - Max 3 pts Language Repeat - Max 1 pts Language Follow 3 Step Command - Max 3 pts   Immunization History  Administered Date(s) Administered  . Influenza Split 05/11/2011  . Influenza Whole 06/30/2010  . Influenza, High Dose Seasonal PF 06/27/2015  . Influenza,inj,Quad PF,6+ Mos 07/29/2013, 05/27/2016, 05/16/2017  . Pneumococcal Conjugate-13 08/26/2015  . Pneumococcal Polysaccharide-23 04/05/2010   Screening Tests Health Maintenance  Topic Date Due  . COLONOSCOPY  09/07/2026 (Originally 09/08/2016)  . TETANUS/TDAP  05/17/2027 (Originally 09/22/1964)  . MAMMOGRAM  04/18/2018  . INFLUENZA VACCINE  Completed  . DEXA SCAN  Completed  . Hepatitis C Screening  Completed  . PNA vac Low Risk Adult  Completed      Plan:     I have personally reviewed and addressed the Medicare Annual Wellness questionnaire and have noted the following in the patient's chart:  A. Medical and social history B. Use of alcohol, tobacco or illicit drugs  C. Current medications and supplements D. Functional ability and status E.  Nutritional status F.  Physical activity G. Advance directives H. List of  other physicians I.  Hospitalizations, surgeries, and ER visits in previous 12 months J.  [place] to include hearing, vision, cognitive, depression L. Referrals and appointments - none  In addition, I have reviewed and discussed with patient certain preventive protocols, quality metrics, and best practice recommendations. A written personalized care plan for preventive services as well as general preventive health recommendations were provided to patient.  See attached scanned questionnaire for additional information.   Signed,   Lindell Noe, MHA, BS, LPN Health Coach

## 2017-05-16 NOTE — Patient Instructions (Signed)
[name] , Thank you for taking time to come for your Medicare Wellness Visit. I appreciate your ongoing commitment to your health goals. Please review the following plan we discussed and let me know if I can assist you in the future.   These are the goals we discussed: Goals    . Increase water intake          Starting 05/16/2017, I will continue to drink at least 6-8 glasses of water daily.        This is a list of the screening recommended for you and due dates:  Health Maintenance  Topic Date Due  . Colon Cancer Screening  09/07/2026*  . Tetanus Vaccine  05/17/2027*  . Mammogram  04/18/2018  . Flu Shot  Completed  . DEXA scan (bone density measurement)  Completed  .  Hepatitis C: One time screening is recommended by Center for Disease Control  (CDC) for  adults born from [birth year] through 1966.   Completed  . Pneumonia vaccines  Completed  *Topic was postponed. The date shown is not the original due date.   Preventive Care for Adults  A healthy lifestyle and preventive care can promote health and wellness. Preventive health guidelines for adults include the following key practices.  . A routine yearly physical is a good way to check with your health care provider about your health and preventive screening. It is a chance to share any concerns and updates on your health and to receive a thorough exam.  . Visit your dentist for a routine exam and preventive care every 6 months. Brush your teeth twice a day and floss once a day. Good oral hygiene prevents tooth decay and gum disease.  . The frequency of eye exams is based on your age, health, family medical history, use  of contact lenses, and other factors. Follow your health care provider's ecommendations for frequency of eye exams.  . Eat a healthy diet. Foods like vegetables, fruits, whole grains, low-fat dairy products, and lean protein foods contain the nutrients you need without too many calories. Decrease your intake of foods  high in solid fats, added sugars, and salt. Eat the right amount of calories for you. Get information about a proper diet from your health care provider, if necessary.  . Regular physical exercise is one of the most important things you can do for your health. Most adults should get at least 150 minutes of moderate-intensity exercise (any activity that increases your heart rate and causes you to sweat) each week. In addition, most adults need muscle-strengthening exercises on 2 or more days a week.  Silver Sneakers may be a benefit available to you. To determine eligibility, you may visit the website: www.silversneakers.com or contact program at [phone] Mon-Fri between 8AM-8PM.   . Maintain a healthy weight. The body mass index (BMI) is a screening tool to identify possible weight problems. It provides an estimate of body fat based on height and weight. Your health care provider can find your BMI and can help you achieve or maintain a healthy weight.   For adults 20 years and older: ? A BMI below 18.5 is considered underweight. ? A BMI of 18.5 to 24.9 is normal. ? A BMI of 25 to 29.9 is considered overweight. ? A BMI of 30 and above is considered obese.   . Maintain normal blood lipids and cholesterol levels by exercising and minimizing your intake of saturated fat. Eat a balanced diet with plenty of fruit and  vegetables. Blood tests for lipids and cholesterol should begin at age 16 and be repeated every 5 years. If your lipid or cholesterol levels are high, you are over 50, or you are at high risk for heart disease, you may need your cholesterol levels checked more frequently. Ongoing high lipid and cholesterol levels should be treated with medicines if diet and exercise are not working.  . If you smoke, find out from your health care provider how to quit. If you do not use tobacco, please do not start.  . If you choose to drink alcohol, please do not consume more than 2 drinks per day.  One drink is considered to be 12 ounces (355 mL) of beer, 5 ounces (148 mL) of wine, or 1.5 ounces (44 mL) of liquor.  . If you are 101-53 years old, ask your health care provider if you should take aspirin to prevent strokes.  . Use sunscreen. Apply sunscreen liberally and repeatedly throughout the day. You should seek shade when your shadow is shorter than you. Protect yourself by wearing long sleeves, pants, a wide-brimmed hat, and sunglasses year round, whenever you are outdoors.  . Once a month, do a whole body skin exam, using a mirror to look at the skin on your back. Tell your health care provider of new moles, moles that have irregular borders, moles that are larger than a pencil eraser, or moles that have changed in shape or color.

## 2017-05-16 NOTE — Progress Notes (Signed)
Pre visit review using our clinic review tool, if applicable. No additional management support is needed unless otherwise documented below in the visit note. 

## 2017-05-17 NOTE — Progress Notes (Signed)
I reviewed health advisor's note, was available for consultation, and agree with documentation and plan.  

## 2017-05-22 ENCOUNTER — Ambulatory Visit (INDEPENDENT_AMBULATORY_CARE_PROVIDER_SITE_OTHER): Payer: Medicare HMO

## 2017-05-22 DIAGNOSIS — Z7901 Long term (current) use of anticoagulants: Secondary | ICD-10-CM | POA: Diagnosis not present

## 2017-05-22 DIAGNOSIS — I480 Paroxysmal atrial fibrillation: Secondary | ICD-10-CM

## 2017-05-22 DIAGNOSIS — Z5181 Encounter for therapeutic drug level monitoring: Secondary | ICD-10-CM | POA: Diagnosis not present

## 2017-05-22 DIAGNOSIS — I4892 Unspecified atrial flutter: Secondary | ICD-10-CM

## 2017-05-22 DIAGNOSIS — I4891 Unspecified atrial fibrillation: Secondary | ICD-10-CM | POA: Diagnosis not present

## 2017-05-22 LAB — POCT INR: INR: 2.9

## 2017-05-24 ENCOUNTER — Ambulatory Visit (INDEPENDENT_AMBULATORY_CARE_PROVIDER_SITE_OTHER): Payer: Medicare HMO | Admitting: Family Medicine

## 2017-05-24 VITALS — BP 111/54 | HR 53 | Temp 97.6°F | Ht 67.5 in | Wt 164.5 lb

## 2017-05-24 DIAGNOSIS — R32 Unspecified urinary incontinence: Secondary | ICD-10-CM

## 2017-05-24 DIAGNOSIS — M8580 Other specified disorders of bone density and structure, unspecified site: Secondary | ICD-10-CM | POA: Diagnosis not present

## 2017-05-24 DIAGNOSIS — Z0000 Encounter for general adult medical examination without abnormal findings: Secondary | ICD-10-CM | POA: Diagnosis not present

## 2017-05-24 MED ORDER — MIRTAZAPINE 15 MG PO TABS: 15.0000 mg | ORAL_TABLET | Freq: Every day | ORAL | 3 refills | Status: DC

## 2017-05-24 NOTE — Patient Instructions (Addendum)
Vitamin D, make sure you are taking 5,000 units a day. For now, take 2 of the 5,000 units tablets a day  Shingrix series - vaccination for shingles is recommended for [name].  Stop the extra over the counter potassium

## 2017-05-24 NOTE — Progress Notes (Signed)
Dr. Frederico Hamman T. Cordella Nyquist, MD, [facility] [place], [address] Phone: [phone] Fax: [phone]  05/24/2017  Patient: [name], MRN: [id number], DOB: 12-08-1945, 71 y.o.  Primary Physician:  Owens Loffler, MD   Chief Complaint  Patient presents with  . Annual Exam    Part 2   Subjective:   [name] is a 71 y.o. pleasant patient who presents with the following:  Health Maintenance Summary Reviewed and updated, unless pt declines services.  Tobacco History Reviewed. Non-smoker Alcohol: No concerns, no excessive use Exercise Habits: Some activity, rec at least 30 mins 5 times a week STD concerns: none Drug Use: None Birth control method: n/a Menses regular: n/a Lumps or breast concerns: no Breast Cancer Family History: no  Insomnia - remeron. She doesn't sleep well at all, and this is been an ongoing thing for much time.  Knees : she has bilateral knee osteoarthritis.  This is been an ongoing problem for a great deal of time.  Bladder - she has been having ongoing incontinence issues for some time.  This is quite bothersome to her.  For the her this is the biggest problem that she has right now.  Incontinence. Look - check on Urology   Health Maintenance  Topic Date Due  . COLONOSCOPY  09/07/2026 (Originally 09/08/2016)  . TETANUS/TDAP  05/17/2027 (Originally 09/22/1963)  . MAMMOGRAM  04/18/2018  . INFLUENZA VACCINE  Completed  . DEXA SCAN  Completed  . Hepatitis C Screening  Completed  . PNA vac Low Risk Adult  Completed    Immunization History  Administered Date(s) Administered  . Influenza Split 05/11/2011  . Influenza Whole 06/30/2010  . Influenza, High Dose Seasonal PF 06/27/2015  . Influenza,inj,Quad PF,6+ Mos 07/29/2013, 05/27/2016, 05/16/2017  . Pneumococcal Conjugate-13 08/26/2015  . Pneumococcal Polysaccharide-23 04/05/2010   Patient Active Problem List   Diagnosis Date Noted   . Mild dementia 10/24/2014    Priority: High  . Chronic combined systolic and diastolic CHF, NYHA class 1 ([place]) 09/29/2014    Priority: High  . 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 Generalized anxiety disorder 11/18/2008    Priority: Medium  . Major depressive disorder, recurrent episode, moderate with anxious distress ([place]) 11/18/2008    Priority: Medium  . Acute low back pain without sciatica 10/12/2016  . Primary osteoarthritis of both knees 10/12/2016  . Fracture of left pelvis ([place]) 04/29/2015  . PAF (paroxysmal atrial fibrillation) ([place]) 03/04/2015  . Atrial flutter ([place]) 03/04/2015  . On amiodarone therapy 11/14/2014  . Current use of long term anticoagulation 11/14/2014  . Hyperlipidemia LDL goal <100 04/05/2010  . GERD 04/05/2010  . 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 Hypothyroid 11/18/2008  . ASTHMA 11/18/2008  . URINARY INCONTINENCE 11/18/2008  . NEPHROLITHIASIS, HX OF 11/18/2008   Past Medical History:  Diagnosis Date  . Acne rosacea   . Anxiety   . Asthma   . Atrial fibrillation and flutter ([place]) 09/2014   Revereted from Afib RVR to 2:1 A Flutter -- TEE/ DCCV 2/9; on Amiodarone  . Chronic combined systolic and diastolic CHF, NYHA class 1 ([facility]) -- Systolic dysfunction [id number][R41.63] 09/2014   Exacerbation 09/2014 2/2 Afib/flutter with RVR - likely Tachycardia induced Cardiomyopathy; Echo 12/2014: EF 25-30%, no RWMA ;; ECHO  08/2015: EF 45-50% with mild HK. Mod LA dilation, normal PA pressures   . CVA (cerebral infarction)    h/o superior cerebellar infarct  . Depression   . Digoxin toxicity 09/2014  . 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 Diverticulosis   . H/O: rheumatic fever    Childhood  . HYPERLIPIDEMIA 04/05/2010   Qualifier: Diagnosis of  By: Lorelei Pont MD, Frederico Hamman    . Hypothyroid    On replacement therapy; normal TSH 09/2014  . Migraine headache   . Osteopenia   . Paroxysmal atrial fibrillation ([place]) 2005; Recurrent 09/2014   a) 2005: s/p Cox Maze & LAA ligation; b) 09/2014: TEE-cardioversion  . Rheumatic mitral and aortic valve insufficiency 2005   a) s/p MV Ring repair; with Cox-Maze for Afib; b) Echo 2013: EF 60-65%,no Regional WMA, Gr 2 DD, no Sig MR ;; c) TEE 10/07/2014: Severlely thickened and calcified MV leaflets. Posterior MV leaflet is fixed and immobile. MAC. reduced excursion of the anterior MV leaflet. Mean MV gradient was 4m Hg and MVA calculated at cm2.    . Urinary incontinence    Past Surgical History:  Procedure Laterality Date  . ABDOMINAL HYSTERECTOMY  1980s   (no cancer),ovaries present  . CARDIAC CATHETERIZATION  Jan 2005   Pre-op R&LHC -- Nonobstructive CAD; normal PA pressures  . CARDIOVERSION  06/04/2012   Procedure: CARDIOVERSION;  Surgeon: JCarlena Bjornstad MD;  [facility]  Service: Cardiovascular;  Laterality: N/A;  . CARDIOVERSION N/A 10/07/2014   Procedure: CARDIOVERSION;  Surgeon: TSueanne Margarita MD;  [facility];  Service: Cardiovascular;  Laterality: N/A;  . LUMBAR DISC SURGERY     x 2 distantly  . MITRAL VALVE REPAIR  2005   with [facility]aze for [employer] . [place] MYOVIEW LTD  4/7/'16   EF 37% with septal hypokinesis and diffuse hypokinesis. No ischemia or infarction. 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  .Marland KitchenRIGHT HEART CATHETERIZATION N/A 10/03/2014   Procedure: RIGHT HEART CATH;  Surgeon: DJolaine Artist MD;  [facility];  Service:  Cardiovascular;  RAP 523mg, RVP 35/2/9 mmHg, PAP 45/12 mmHg, PCWP 16 mmHg; CO/I by Fick: 3.9/2.3; Ao/PA/SVC SaO2%: 97%/63%/65%.  . TEE WITHOUT CARDIOVERSION N/A 10/07/2014   Procedure: TRANSESOPHAGEAL ECHOCARDIOGRAM (TEE);  Surgeon: TrSueanne MargaritaMD;  [facility];  Service: Cardiovascular;  Laterality: N/A;  . THYROIDECTOMY, PARTIAL     partial-no cancer; now on thyroid replacement  . TRANSTHORACIC ECHOCARDIOGRAM  2013; 2/'16 & 5/'16   a) 2013: EF 60-65%, mild MR, Gr 2 DD; b[place] [facility] [facility] - severel global LV dysfunction (systolic & diastolic) - dilated LV & restrictive filling pattern - Gr 3 DD, mod reduced RV function, severely dilated LA - 6.4 cm, mod dilated RA, mod pl effusion, mod MR, mild TR; c) 5/10/'16:  EF 25-30%, mod LV dilation, no RWMA,Gr 3 DD w/ high LAP, MV sewing ring intact w/ Mod MR, Severe LA dilation  . TRANSTHORACIC ECHOCARDIOGRAM  January 2017:   EF improved to 45-50%. Mild diffuse HK. Mild MR. Moderate LA dilation.  Normal PA pressures.   Social History   Social History  . Marital status: Married    Spouse name: N/A  . Number of children: 2  . Years of education: N/A   Occupational History  . Retired    Social History Main Topics  . Smoking status: Never Smoker  . Smokeless tobacco: Never Used  . Alcohol use No  . Drug use: No  . Sexual activity: Yes   Other Topics Concern  . Not on file   Social History Narrative  . No narrative on file   Family History  Problem Relation Age of Onset  . Diabetes Mother   . Coronary artery disease Mother   . Kidney disease Mother   . Breast cancer Neg Hx    No Known Allergies  Medication list has been reviewed and updated.   General: Denies fever, chills, sweats. No significant weight loss. Eyes: Denies blurring,significant itching ENT: Denies earache, sore throat, and hoarseness.  Cardiovascular: Denies chest pains, palpitations, dyspnea on exertion,  Respiratory: Denies cough, dyspnea at  rest,wheeezing Breast: no concerns about lumps GI: Denies nausea, vomiting, diarrhea, constipation, change in bowel habits, abdominal pain, melena, hematochezia GU: Denies dysuria, hematuria, urinary hesitancy, nocturia, denies STD risk, no concerns about discharge Musculoskeletal: Denies back pain, joint pain Derm: Denies rash, itching Neuro: Denies  paresthesias, frequent falls, frequent headaches Psych: Denies depression, anxiety Endocrine: Denies cold intolerance, heat intolerance, polydipsia Heme: Denies enlarged lymph nodes Allergy: No hayfever  Objective:   BP (!) 111/54   Pulse (!) 53   Temp 97.6 F (36.4 C) (Oral)   Ht 5' 7.5" (1.715 m)   Wt 164 lb 8 oz (74.6 kg)   BMI 25.38 kg/m  No exam data present  GEN: well developed, well nourished, no acute distress Eyes: conjunctiva and lids normal, PERRLA, EOMI ENT: TM clear, nares clear, oral exam WNL Neck: supple, no lymphadenopathy, no thyromegaly, no JVD Pulm: clear to auscultation and percussion, respiratory effort normal CV: regular rate and rhythm, S1-S2, no murmur, rub or gallop, no bruits Chest: no scars, masses, no lumps BREAST: breast exam declined GI: soft, non-tender; no hepatosplenomegaly, masses; active bowel sounds all quadrants GU: GU exam declined Lymph: no cervical, axillary or inguinal adenopathy MSK: gait normal, muscle tone and strength WNL, no joint swelling, effusions, discoloration, crepitus  SKIN: clear, good turgor, color WNL, no rashes, lesions, or ulcerations Neuro: normal mental status, normal strength, sensation, and motion Psych: alert; oriented to person, place and time, normally interactive and not anxious or depressed in appearance.   All labs reviewed with patient. Lipids:    Component Value Date/Time   CHOL 207 (H) 05/16/2017 1214   CHOL 123 09/30/2014 1650   TRIG 116.0 05/16/2017 1214   TRIG 93 09/30/2014 1650   HDL 77.40 05/16/2017 1214   HDL 53 09/30/2014 1650   LDLDIRECT  132.3 03/31/2010 0906   VLDL 23.2 05/16/2017 1214   VLDL 19 09/30/2014 1650   CHOLHDL 3 05/16/2017 1214   CBC: CBC Latest Ref Rng & Units 05/16/2017 03/04/2016 08/26/2015  WBC 4.0 - 10.5 K/uL 4.8 5.0 4.9  Hemoglobin 12.0 - 15.0 g/dL 13.7 12.6 12.6  Hematocrit 36.0 - 46.0 % 41.3 37.3 38.4  Platelets 150.0 - 400.0 K/uL 189.0 210.0 017.5    Basic Metabolic Panel:    Component Value Date/Time   NA 138 05/16/2017 1214   NA 140 01/27/2015 1004   NA 138 10/03/2014 0255   K 5.7 (H) 05/16/2017 1214  K 4.2 10/03/2014 0255   CL 103 05/16/2017 1214   CL 105 10/03/2014 0255   CO2 30 05/16/2017 1214   CO2 26 10/03/2014 0255   BUN 23 05/16/2017 1214   BUN 9 01/27/2015 1004   BUN 8 10/03/2014 0255   CREATININE 0.85 05/16/2017 1214   CREATININE 0.81 10/03/2014 0255   CREATININE 1.06 09/10/2012 1616   GLUCOSE 103 (H) 05/16/2017 1214   GLUCOSE 65 10/03/2014 0255   CALCIUM 9.8 05/16/2017 1214   CALCIUM 7.8 (L) 10/03/2014 0255   Hepatic Function Latest Ref Rng & Units 05/16/2017 03/04/2016 01/27/2015  Total Protein 6.0 - 8.3 g/dL 7.2 6.5 7.0  Albumin 3.5 - 5.2 g/dL 4.5 4.2 4.4  AST 0 - 37 U/L 21 21 34  ALT 0 - 35 U/L _0 Alk Phosphatase 39 - 117 U/L 78 73 80  Total Bilirubin 0.2 - 1.2 mg/dL 0.6 0.6 0.6  Bilirubin, Direct 0.0 - 0.3 mg/dL 0.1 - -    Lab Results  Component Value Date   TSH 6.21 (H) 05/16/2017   No results found.  Assessment and Plan:   Healthcare maintenance  Osteopenia, unspecified location - Plan: DG Bone Density  Urinary incontinence in female   Incontinence evaluation, urology.  Mild elevation of potassium.  In addition to the patient's prescribed 20 mEq of potassium, she was additionally taking some over-the-counter potassium.  We had a long discussion regarding over-the-counter supplements, and she is going to stop most of them with the exception of her calcium and vitamin D.  Health Maintenance Exam: The patient's preventative maintenance and  recommended screening tests for an annual wellness exam were reviewed in full today. Brought up to date unless services declined.  Counselled on the importance of diet, exercise, and its role in overall health and mortality. The patient's FH and SH was reviewed, including their home life, tobacco status, and drug and alcohol status.  Follow-up in 1 year for physical exam or additional follow-up below.  Follow-up: Return in about 2 years (around 05/25/2019) for 30 mins ortho consult. Or follow-up in 1 year if not noted.  Future Appointments Date Time Provider [place]  06/06/2017 1:20 PM End, Harrell Gave, MD [facility]  06/08/2017 9:30 AM Owens Loffler, MD [facility]  06/12/2017 8:30 AM [facility]  07/10/2017 8:40 AM [facility]  07/10/2017 9:00 AM [facility]EXA 1 [facility]  08/07/2017 10:30 AM [facility] DEXA 1 [facility]  05/21/2018 9:45 AM Pinson, Venetia Maxon, LPN [facility]  05/28/2018 8:30 AM Carvel Huskins, Frederico Hamman, MD [facility]    Meds ordered this encounter  Medications  . mirtazapine (REMERON) 15 MG tablet    Sig: Take 1 tablet (15 mg total) by mouth at bedtime.    Dispense:  30 tablet    Refill:  3   Medications Discontinued During This Encounter  Medication Reason  . benzonatate (TESSALON) 200 MG capsule Completed Course  . IRON PO   . POTASSIUM PO   . DOXYLAMINE SUCCINATE PO    Orders Placed This Encounter  Procedures  . DG Bone Density    Signed,  Mylin Hirano T. Carnell Beavers, MD   Allergies as of 05/24/2017   No Known Allergies     Medication List       Accurate as of 05/24/17 11:59 PM. Always use your most recent med list.          acetaminophen 325 MG tablet Commonly known as:  TYLENOL Take 2 tablets (  650 mg total) by mouth every 4 (four) hours as needed for mild pain, fever or headache.   amiodarone 200 MG tablet Commonly known as:  PACERONE Take 1 tablet (200  mg total) by mouth daily.   BIOTIN PO Take by mouth.   CALCIUM + D PO Take 2 capsules by mouth daily.   carvedilol 3.125 MG tablet Commonly known as:  COREG TAKE ONE TABLET BY MOUTH TWICE DAILY   CRANBERRY PO Take by mouth.   donepezil 5 MG tablet Commonly known as:  ARICEPT TAKE ONE TABLET BY MOUTH AT BEDTIME   FISH OIL PO Take 1 capsule by mouth daily.   IRON (FERROUS SULFATE) PO Take 1 tablet by mouth daily.   levothyroxine 25 MCG tablet Commonly known as:  SYNTHROID, LEVOTHROID TAKE TWO AND ONE-HALF TABLETS BY MOUTH DAILY BEFORE BREAKFAST   losartan 25 MG tablet Commonly known as:  COZAAR TAKE ONE TABLET BY MOUTH EVERY DAY   MAGNESIUM PO Take by mouth.   mirtazapine 15 MG tablet Commonly known as:  REMERON Take 1 tablet (15 mg total) by mouth at bedtime.   potassium chloride SA 20 MEQ tablet Commonly known as:  K-DUR,KLOR-CON Take 20 mEq by mouth daily.   PRESERVISION AREDS 2 PO Take by mouth.   VITAMIN B-12 PO Take 1 tablet by mouth daily.   VITAMIN C PO Take by mouth.   VITAMIN D PO Take by mouth.   warfarin 5 MG tablet Commonly known as:  COUMADIN Take 1 to 1 and 1/2 tablets every day as directed by coumadin clinic            Discharge Care Instructions        Start     Ordered   05/24/17 0000  DG Bone Density    Question Answer Comment  Reason for exam: f/u osteopenia, 07/2017   Preferred imaging location? [facility]      05/24/17 1056   05/24/17 0000  mirtazapine (REMERON) 15 MG tablet  Daily at bedtime     05/24/17 1120

## 2017-05-30 DIAGNOSIS — Z7901 Long term (current) use of anticoagulants: Secondary | ICD-10-CM | POA: Diagnosis not present

## 2017-05-30 DIAGNOSIS — I38 Endocarditis, valve unspecified: Secondary | ICD-10-CM | POA: Diagnosis not present

## 2017-05-30 DIAGNOSIS — Z1211 Encounter for screening for malignant neoplasm of colon: Secondary | ICD-10-CM | POA: Diagnosis not present

## 2017-05-30 DIAGNOSIS — R143 Flatulence: Secondary | ICD-10-CM | POA: Diagnosis not present

## 2017-05-30 DIAGNOSIS — Z952 Presence of prosthetic heart valve: Secondary | ICD-10-CM | POA: Diagnosis not present

## 2017-05-30 DIAGNOSIS — K5903 Drug induced constipation: Secondary | ICD-10-CM | POA: Diagnosis not present

## 2017-05-30 DIAGNOSIS — J439 Emphysema, unspecified: Secondary | ICD-10-CM | POA: Diagnosis not present

## 2017-06-04 NOTE — Progress Notes (Deleted)
Follow-up Outpatient Visit Date: 06/06/2017  Primary Care Provider: Owens Loffler, MD [place] [address]  Chief Complaint: ***  HPI:  [name] is a 71 y.o. 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 I last saw her in November, at which time she was doing well.  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  LVEF 50% with severe mitral valve prolapse and MR.  RA 3, RV 28/4, PA 21/10 (16), PCWP 12.  Thermodiluation CO 2.5.  CV Surgery:  Mitral valve repair with placement of 48mm Seguin annuloplasty ring and modified Cox-Maze procedure (10/15/03, Dr. Roxy Manns).  EP Procedures and Devices:  DCCV (03/12/04)  DCCV (06/04/12)  DCCV (10/07/14)  Non-Invasive Evaluation(s):  Myocardial perfusion stress test (12/04/14): No ischemia identified.  LVEF 37% with global hypokinesis, most pronounced in the septum.  Intermediate risk study (due to low LVEF).  TTE (01/06/15): Moderately dilated LV with normal wall thickness.  LVEF 25-30% with grade 3 diastolic dysfunction.  Moderate MR status post mitral valve repair.  LA severely dilated.  Mild TR.  Normal PA pressure.  Normal RV size and function.  TTE (09/09/15):  Mildly dilated LV with LVEF 45-50%.  Trivial AI and mild MR.  Moderately dilated LA.  Normal RV size/function and PA pressure.  Recent CV Pertinent Labs: Lab Results  Component Value Date   CHOL 207 (H) 05/16/2017   CHOL 123 09/30/2014   HDL 77.40 05/16/2017   HDL 53 09/30/2014   LDLCALC 107 (H) 05/16/2017   LDLCALC 51 09/30/2014   LDLDIRECT 132.3 03/31/2010   TRIG 116.0 05/16/2017   TRIG 93 09/30/2014   CHOLHDL 3 05/16/2017   INR 2.9 05/22/2017   INR 11.5 (A) 05/05/2015   INR 2.5 10/03/2014   BNP 691.1 (H) 10/15/2014   K 5.7 (H) 05/16/2017   K 4.2 10/03/2014   MG 2.1 10/03/2014   MG 2.3 10/03/2014   BUN 23 05/16/2017   BUN 9 01/27/2015   BUN 8 10/03/2014   CREATININE 0.85 05/16/2017   CREATININE 0.81 10/03/2014   CREATININE 1.06 09/10/2012    Past medical and surgical history were reviewed and updated in EPIC.  No outpatient prescriptions have been marked as taking for the 06/06/17 encounter (Appointment) with Gwendloyn Forsee, Harrell Gave, MD.    Allergies: Patient has no known allergies.  Social History   Social History  . Marital status: Married    Spouse name: N/A  . Number of children: 2  . Years of education: N/A   Occupational History  . Retired    Social History Main Topics  . Smoking status: Never Smoker  . Smokeless tobacco: Never Used  . Alcohol use No  . Drug use: No  . Sexual activity: Yes   Other Topics Concern  . Not on file   Social History Narrative  . No narrative on file    Family History  Problem Relation Age of Onset  .  Diabetes Mother   . Coronary artery disease Mother   . Kidney disease Mother   . Breast cancer Neg Hx     Review of Systems: A 12-system review of systems was performed and was negative except as noted in the HPI.  --------------------------------------------------------------------------------------------------  Physical Exam: There were no vitals taken for this visit.  General:  *** HEENT: No conjunctival  pallor or scleral icterus. Moist mucous membranes.  OP clear. Neck: Supple without lymphadenopathy, thyromegaly, JVD, or HJR. No carotid bruit. Lungs: Normal work of breathing. Clear to auscultation bilaterally without wheezes or crackles. Heart: Regular rate and rhythm without murmurs, rubs, or gallops. Non-displaced PMI. Abd: Bowel sounds present. Soft, NT/ND without hepatosplenomegaly Ext: No lower extremity edema. Radial, PT, and DP pulses are 2+ bilaterally. Skin: Warm and dry without rash.  EKG:  ***  Lab Results  Component Value Date   WBC 4.8 05/16/2017   HGB 13.7 05/16/2017   HCT 41.3 05/16/2017   MCV 103.1 (H) 05/16/2017   PLT 189.0 05/16/2017    Lab Results  Component Value Date   NA 138 05/16/2017   K 5.7 (H) 05/16/2017   CL 103 05/16/2017   CO2 30 05/16/2017   BUN 23 05/16/2017   CREATININE 0.85 05/16/2017   GLUCOSE 103 (H) 05/16/2017   ALT 23 05/16/2017    Lab Results  Component Value Date   CHOL 207 (H) 05/16/2017   HDL 77.40 05/16/2017   LDLCALC 107 (H) 05/16/2017   LDLDIRECT 132.3 03/31/2010   TRIG 116.0 05/16/2017   CHOLHDL 3 05/16/2017    --------------------------------------------------------------------------------------------------  ASSESSMENT AND PLAN: Harrell Gave Rishard Delange, MD 06/04/2017 9:20 PM

## 2017-06-06 ENCOUNTER — Ambulatory Visit: Payer: Medicare HMO | Admitting: Internal Medicine

## 2017-06-08 ENCOUNTER — Encounter: Payer: Self-pay | Admitting: Family Medicine

## 2017-06-08 ENCOUNTER — Ambulatory Visit (INDEPENDENT_AMBULATORY_CARE_PROVIDER_SITE_OTHER): Payer: Medicare HMO | Admitting: Family Medicine

## 2017-06-08 VITALS — BP 110/62 | HR 61 | Temp 98.3°F | Ht 67.5 in | Wt 165.5 lb

## 2017-06-08 DIAGNOSIS — M25562 Pain in left knee: Secondary | ICD-10-CM | POA: Diagnosis not present

## 2017-06-08 DIAGNOSIS — G8929 Other chronic pain: Secondary | ICD-10-CM | POA: Diagnosis not present

## 2017-06-08 DIAGNOSIS — M1120 Other chondrocalcinosis, unspecified site: Secondary | ICD-10-CM

## 2017-06-08 DIAGNOSIS — M25561 Pain in right knee: Secondary | ICD-10-CM

## 2017-06-08 MED ORDER — METHYLPREDNISOLONE ACETATE 40 MG/ML IJ SUSP
80.0000 mg | Freq: Once | INTRAMUSCULAR | Status: AC
  Administered 2017-06-08: 80 mg via INTRA_ARTICULAR

## 2017-06-08 NOTE — Progress Notes (Signed)
Dr. Frederico Hamman T. Zamauri Nez, MD, [facility] [place], [address] Phone: [phone] Fax: [phone]  06/08/2017  Patient: [name], MRN: [id number], DOB: [birth year], 71 y.o.  Primary Physician:  Owens Loffler, MD   Chief Complaint  Patient presents with  . Knee Pain    Bilateral   Subjective:   [name] is a 71 y.o. very pleasant female patient who presents with the following:  CPPD?  Patient presents with bilateral knee pain ongoing for multiple years. She most recently had knee x-rays approximately 8 months ago which is significant mostly for chondrocalcinosis bilaterally. She also has some mild osteoarthritic changes. I reviewed these with her face-to-face and we reviewed the films together and with my independent evaluation.  She has pain much of the time and pain with getting up from a seated position and walking and going up and downstairs. No prior history of trauma, fracture, or operation on the affected knees.  Asp R knee and inj  inj L knee.  Past Medical History, Surgical History, Social History, Family History, Problem List, Medications, and Allergies have been reviewed and updated if relevant.  Patient Active Problem List   Diagnosis Date Noted  . Mild dementia 10/24/2014    Priority: High  . Chronic combined systolic and diastolic CHF, NYHA class 1 ([place] [facility]) 09/29/2014    Priority: High  . 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 Generalized anxiety disorder 11/18/2008    Priority: Medium  . Major depressive disorder, recurrent episode, moderate with anxious distress ([place]) 11/18/2008    Priority: Medium  . Acute low back pain without sciatica 10/12/2016  . Primary osteoarthritis of both knees 10/12/2016  . Fracture of left pelvis ([place])  04/29/2015  . PAF (paroxysmal atrial fibrillation) ([place]) 03/04/2015  . Atrial flutter ([place]) 03/04/2015  . On amiodarone therapy 11/14/2014  . Current use of long term anticoagulation 11/14/2014  . Hyperlipidemia LDL goal <100 04/05/2010  . GERD 04/05/2010  . 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 Hypothyroid 11/18/2008  . ASTHMA 11/18/2008  . URINARY INCONTINENCE 11/18/2008  . NEPHROLITHIASIS, HX OF 11/18/2008    Past Medical History:  Diagnosis Date  . Acne rosacea   . Anxiety   . Asthma   . Atrial fibrillation and flutter ([place]) 09/2014   Revereted from Afib RVR to 2:1 A Flutter -- TEE/ DCCV 2/9; on Amiodarone  . Chronic combined systolic and diastolic CHF, NYHA class 1 ([facility]) -- Systolic dysfunction [id number][I50.27] 09/2014   Exacerbation 09/2014 2/2 Afib/flutter with RVR - 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 Mod LA dilation, normal PA pressures   . CVA (cerebral infarction)    h/o superior cerebellar infarct  . Depression   . Digoxin toxicity 09/2014  . 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 Diverticulosis   . H/O: rheumatic fever    Childhood  .  HYPERLIPIDEMIA 04/05/2010   Qualifier: Diagnosis of  By: Lorelei Pont MD, Frederico Hamman    . Hypothyroid    On replacement therapy; normal TSH 09/2014  . Migraine headache   . Osteopenia   . Paroxysmal atrial fibrillation ([place]) 2005; Recurrent 09/2014   a) 2005: s/p Cox Maze & LAA ligation; b) 09/2014: TEE-cardioversion  . Rheumatic mitral and aortic valve insufficiency 2005   a) s/p MV Ring repair; with Cox-Maze for Afib; b) Echo 2013:  EF 60-65%,no Regional WMA, Gr 2 DD, no Sig MR ;; c) TEE 10/07/2014: Severlely thickened and calcified MV leaflets. Posterior MV leaflet is fixed and immobile. MAC. reduced excursion of the anterior MV leaflet. Mean MV gradient was 74m Hg and MVA calculated at cm2.    . Urinary incontinence     Past Surgical History:  Procedure Laterality Date  . ABDOMINAL HYSTERECTOMY  1980s   (no cancer),ovaries present  . CARDIAC CATHETERIZATION  Jan 2005   Pre-op R&LHC -- Nonobstructive CAD; normal PA pressures  . CARDIOVERSION  06/04/2012   Procedure: CARDIOVERSION;  Surgeon: JCarlena Bjornstad MD;  [facility]  Service: Cardiovascular;  Laterality: N/A;  . CARDIOVERSION N/A 10/07/2014   Procedure: CARDIOVERSION;  Surgeon: TSueanne Margarita MD;  [facility];  Service: Cardiovascular;  Laterality: N/A;  . LUMBAR DISC SURGERY     x 2 distantly  . MITRAL VALVE REPAIR  2005   with Cox Maze for [employer] . [place] [employer]  4/7/'16   EF 37% with septal hypokinesis and diffuse hypokinesis. No ischemia or infarction. 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  .Marland KitchenRIGHT HEART CATHETERIZATION N/A 10/03/2014   Procedure: RIGHT HEART CATH;  Surgeon: DJolaine Artist MD;  [facility];  Service: Cardiovascular;  RAP 585mg, RVP 35/2/9 mmHg, PAP 45/12 mmHg, PCWP 16 mmHg; CO/I by Fick: 3.9/2.3; Ao/PA/SVC SaO2%: 97%/63%/65%.  . TEE WITHOUT CARDIOVERSION N/A 10/07/2014   Procedure: TRANSESOPHAGEAL ECHOCARDIOGRAM (TEE);  Surgeon: TrSueanne MargaritaMD;  [facility];  Service: Cardiovascular;  Laterality: N/A;  . THYROIDECTOMY, PARTIAL     partial-no cancer; now on thyroid replacement  . TRANSTHORACIC ECHOCARDIOGRAM  2013; 2/'16 & 5/'16   a) 2013: EF 60-65%, mild MR, Gr 2 DD; [place] - severel global LV dysfunction (systolic & diastolic) - dilated LV & restrictive filling pattern - Gr 3 DD, mod reduced RV function,  severely dilated LA - 6.4 cm, mod dilated RA, mod pl effusion, mod MR, mild TR; c) 5/10/'16:  EF 25-30%, mod LV dilation, no RWMA,Gr 3 DD w/ high LAP, MV sewing ring intact w/ Mod MR, Severe LA dilation  . TRANSTHORACIC ECHOCARDIOGRAM  January 2017:   EF improved to 45-50%. Mild diffuse HK. Mild MR. Moderate LA dilation. Normal PA pressures.    Social History   Social History  . Marital status: Married    Spouse name: N/A  . Number of children: 2  . Years of education: N/A   Occupational History  . Retired    Social History Main Topics  . Smoking status: Never Smoker  . Smokeless tobacco: Never Used  . Alcohol use No  . Drug use: No  . Sexual activity: Yes   Other Topics Concern  . Not on file   Social History Narrative  . No narrative on file    Family History  Problem Relation Age of Onset  . Diabetes Mother   . Coronary artery  disease Mother   . Kidney disease Mother   . Breast cancer Neg Hx     No Known Allergies  Medication list reviewed and updated in full in [facility].  GEN: No fevers, chills. Nontoxic. Primarily MSK c/o today. MSK: Detailed in the HPI GI: tolerating PO intake without difficulty Neuro: No numbness, parasthesias, or tingling associated. Otherwise the pertinent positives of the ROS are noted above.   Objective:   BP 110/62   Pulse 61   Temp 98.3 F (36.8 C) (Oral)   Ht 5' 7.5" (1.715 m)   Wt 165 lb 8 oz (75.1 kg)   BMI 25.54 kg/m    GEN: WDWN, NAD, Non-toxic, Alert & Oriented x 3 HEENT: Atraumatic, Normocephalic.  Ears and Nose: No external deformity. EXTR: No clubbing/cyanosis/edema NEURO: Normal gait.  PSYCH: Normally interactive. Conversant. Not depressed or anxious appearing.  Calm demeanor.   Knee:  b Gait: Normal heel toe pattern ROM: lacks 2 deg ext, flexion to 115 Effusion: mild Echymosis or edema: none Patellar tendon NT Painful PLICA: neg Patellar grind: negative Medial and lateral patellar facet  loading: negative medial and lateral joint lines:NT Mcmurray's pain Flexion-pinch pos Varus and valgus stress: stable Lachman: neg Ant and Post drawer: neg Hip abduction, IR, ER: WNL Hip flexion str: 5/5 Hip abd: 5/5 Quad: 5/5 VMO atrophy:No Hamstring concentric and eccentric: 5/5   Radiology:  Assessment and Plan:   Chronic pain of both knees - Plan: Synovial cell count + diff, w/ crystals, methylPREDNISolone acetate (DEPO-MEDROL) injection 80 mg, methylPREDNISolone acetate (DEPO-MEDROL) injection 80 mg, Anaerobic and Aerobic Culture, CANCELED: Body fluid culture  Chondrocalcinosis  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 His CPPD crystals are seen on aspirate, then scheduled colchicine might be a reasonable idea.  Aspirate the patient's right knee, and inject her left knee without aspiration.  Knee Injection, L Patient verbally consented to procedure. Risks (including potential rare risk of infection), benefits, and alternatives explained. Sterilely prepped with Chloraprep. Ethyl cholride used for anesthesia. 8 cc Lidocaine 1% mixed with 2 mL Depo-Medrol 40 mg injected using the anteromedial approach without difficulty. No complications with procedure and tolerated well. Patient had decreased pain post-injection.   Knee Aspiration and Injection, R Patient verbally consented; risks, benefits, and alternatives explained including possible infection. Patient prepped with Chloraprep. Ethyl chloride for anesthesia. 10 cc of 1% Lidocaine used in wheal then injected Subcutaneous fashion with 22 gauge needle on lateral approach. Under sterilne conditions, 18 gauge needle used via lateral approach to aspirate 8 cc of bloody, serosanguinous fluid. Then 8 cc of Lidocaine 1% and 2 mL of Depo-Medrol 40 mg injected. Tolerated well, decreased pain, no complications.   Follow-up: as scheduled  Future Appointments Date Time Provider [place]  06/12/2017 8:30 AM [facility]   06/19/2017 8:30 AM Stoioff, Ronda Fairly, MD [facility] None  07/10/2017 8:40 AM [facility]  07/13/2017 11:30 AM Rise Mu, PA-C [facility]  08/07/2017 10:30 AM [facility] DEXA 1 [facility]  05/21/2018 9:45 AM Barnetta Hammersmith, Venetia Maxon, LPN [facility]  05/28/2018 8:30 AM Tariyah Pendry, Frederico Hamman, MD [facility]    Meds ordered this encounter  Medications  . polyethylene glycol powder (GLYCOLAX/MIRALAX) powder  . methylPREDNISolone acetate (DEPO-MEDROL) injection 80 mg  . methylPREDNISolone acetate (DEPO-MEDROL) injection 80 mg   Medications Discontinued During This Encounter  Medication Reason  . mirtazapine (REMERON) 15 MG tablet Side effect (s)   Orders Placed This Encounter  Procedures  . Anaerobic and Aerobic Culture  .  Synovial cell count + diff, w/ crystals    Signed,  Jos Cygan T. Fabienne Nolasco, MD   Allergies as of 06/08/2017   No Known Allergies     Medication List       Accurate as of 06/08/17  2:28 PM. Always use your most recent med list.          acetaminophen 325 MG tablet Commonly known as:  TYLENOL Take 2 tablets (650 mg total) by mouth every 4 (four) hours as needed for mild pain, fever or headache.   amiodarone 200 MG tablet Commonly known as:  PACERONE Take 1 tablet (200 mg total) by mouth daily.   BIOTIN PO Take by mouth.   CALCIUM + D PO Take 2 capsules by mouth daily.   carvedilol 3.125 MG tablet Commonly known as:  COREG TAKE ONE TABLET BY MOUTH TWICE DAILY   CRANBERRY PO Take by mouth.   donepezil 5 MG tablet Commonly known as:  ARICEPT TAKE ONE TABLET BY MOUTH AT BEDTIME   FISH OIL PO Take 1 capsule by mouth daily.   IRON (FERROUS SULFATE) PO Take 1 tablet by mouth daily.   levothyroxine 25 MCG tablet Commonly known as:  SYNTHROID, LEVOTHROID TAKE TWO AND ONE-HALF TABLETS BY MOUTH DAILY BEFORE BREAKFAST   losartan 25 MG tablet Commonly known as:  COZAAR TAKE ONE TABLET BY MOUTH EVERY DAY     MAGNESIUM PO Take by mouth.   polyethylene glycol powder powder Commonly known as:  GLYCOLAX/MIRALAX   potassium chloride SA 20 MEQ tablet Commonly known as:  K-DUR,KLOR-CON Take 20 mEq by mouth daily.   PRESERVISION AREDS 2 PO Take by mouth.   VITAMIN B-12 PO Take 1 tablet by mouth daily.   VITAMIN C PO Take by mouth.   VITAMIN D PO Take by mouth.   warfarin 5 MG tablet Commonly known as:  COUMADIN Take 1 to 1 and 1/2 tablets every day as directed by coumadin clinic

## 2017-06-08 NOTE — Addendum Note (Signed)
Addended by: Ellamae Sia on: 06/08/2017 03:03 PM   Modules accepted: Orders

## 2017-06-09 LAB — SYNOVIAL CELL COUNT + DIFF, W/ CRYSTALS
Basophils, %: 0 %
Eosinophils-Synovial: 0 % (ref 0–2)
Lymphocytes-Synovial Fld: 10 % (ref 0–74)
Monocyte/Macrophage: 67 % (ref 0–69)
NEUTROPHIL, SYNOVIAL: 7 % (ref 0–24)
Synoviocytes, %: 16 % — ABNORMAL HIGH (ref 0–15)
WBC, SYNOVIAL: 450 {cells}/uL — AB (ref <150)

## 2017-06-09 LAB — TIQ-NTM

## 2017-06-12 ENCOUNTER — Telehealth: Payer: Self-pay | Admitting: Family Medicine

## 2017-06-12 ENCOUNTER — Other Ambulatory Visit
Admission: AD | Admit: 2017-06-12 | Discharge: 2017-06-12 | Disposition: A | Discharge status: Home / Self Care | Payer: Medicare HMO | Source: Ambulatory Visit | Attending: Cardiovascular Disease | Admitting: Cardiovascular Disease

## 2017-06-12 ENCOUNTER — Ambulatory Visit (INDEPENDENT_AMBULATORY_CARE_PROVIDER_SITE_OTHER): Payer: Medicare HMO

## 2017-06-12 DIAGNOSIS — I4891 Unspecified atrial fibrillation: Secondary | ICD-10-CM | POA: Insufficient documentation

## 2017-06-12 DIAGNOSIS — R791 Abnormal coagulation profile: Secondary | ICD-10-CM

## 2017-06-12 DIAGNOSIS — I4892 Unspecified atrial flutter: Secondary | ICD-10-CM | POA: Diagnosis not present

## 2017-06-12 DIAGNOSIS — Z7901 Long term (current) use of anticoagulants: Secondary | ICD-10-CM | POA: Insufficient documentation

## 2017-06-12 DIAGNOSIS — Z5181 Encounter for therapeutic drug level monitoring: Secondary | ICD-10-CM | POA: Insufficient documentation

## 2017-06-12 DIAGNOSIS — I480 Paroxysmal atrial fibrillation: Secondary | ICD-10-CM | POA: Diagnosis not present

## 2017-06-12 LAB — PROTIME-INR
INR: 3.57
Prothrombin Time: 35.4 seconds — ABNORMAL HIGH (ref 11.4–15.2)

## 2017-06-12 LAB — POCT INR: INR: 4.7

## 2017-06-12 NOTE — Telephone Encounter (Signed)
[name] notified by telephone that her joint fluid looked ok - no crystals. The medication that Dr Lorelei Pont had hoped would help won't help since she does not have the CPPD-crystal arthritis he was worried about. The changes we saw on x-rays was more likely from arthritis.

## 2017-06-12 NOTE — Telephone Encounter (Signed)
Caller Name:Berenise Basilio  Relationship to Patient:self Best [phone] Pharmacy:  Reason for call:  Returning your call

## 2017-06-14 LAB — ANAEROBIC AND AEROBIC CULTURE
AER RESULT: NO GROWTH
MICRO NUMBER:: 81135063
MICRO NUMBER:: 81135064
SPECIMEN QUALITY: ADEQUATE
SPECIMEN QUALITY:: ADEQUATE

## 2017-06-19 ENCOUNTER — Other Ambulatory Visit: Payer: Self-pay | Admitting: Family Medicine

## 2017-06-19 ENCOUNTER — Encounter: Payer: Self-pay | Admitting: Urology

## 2017-06-19 ENCOUNTER — Ambulatory Visit (INDEPENDENT_AMBULATORY_CARE_PROVIDER_SITE_OTHER): Payer: Medicare HMO | Admitting: Urology

## 2017-06-19 ENCOUNTER — Telehealth: Payer: Self-pay

## 2017-06-19 VITALS — BP 89/48 | HR 52 | Ht 69.0 in | Wt 162.0 lb

## 2017-06-19 DIAGNOSIS — R350 Frequency of micturition: Secondary | ICD-10-CM | POA: Diagnosis not present

## 2017-06-19 DIAGNOSIS — N3946 Mixed incontinence: Secondary | ICD-10-CM | POA: Diagnosis not present

## 2017-06-19 DIAGNOSIS — R351 Nocturia: Secondary | ICD-10-CM

## 2017-06-19 LAB — URINALYSIS, COMPLETE
BILIRUBIN UA: NEGATIVE
Glucose, UA: NEGATIVE
Ketones, UA: NEGATIVE
Nitrite, UA: NEGATIVE
PH UA: 6.5 (ref 5.0–7.5)
PROTEIN UA: NEGATIVE
RBC UA: NEGATIVE
Specific Gravity, UA: 1.02 (ref 1.005–1.030)
UUROB: 0.2 mg/dL (ref 0.2–1.0)

## 2017-06-19 LAB — MICROSCOPIC EXAMINATION
Epithelial Cells (non renal): NONE SEEN /hpf (ref 0–10)
RBC MICROSCOPIC, UA: NONE SEEN /HPF (ref 0–?)
WBC, UA: NONE SEEN /hpf (ref 0–?)

## 2017-06-19 LAB — BLADDER SCAN AMB NON-IMAGING

## 2017-06-19 MED ORDER — SOLIFENACIN SUCCINATE 5 MG PO TABS: 5.0000 mg | ORAL_TABLET | Freq: Every day | ORAL | 2 refills | Status: DC

## 2017-06-19 MED ORDER — TOLTERODINE TARTRATE ER 4 MG PO CP24: 4.0000 mg | ORAL_CAPSULE | Freq: Every day | ORAL | 2 refills | Status: DC

## 2017-06-19 NOTE — Telephone Encounter (Signed)
Pt called stating the vesicare was going to cost her $60/month. Pt stated that her pharmacist gave her the alternatives of:  Ditropan uroxalvol detrol LA  Which would you prefer pt have?

## 2017-06-19 NOTE — Telephone Encounter (Signed)
Please review pt requests

## 2017-06-19 NOTE — Telephone Encounter (Signed)
LMOM- Detrol has been sent in by Dr. Bernardo Heater.

## 2017-06-19 NOTE — Progress Notes (Signed)
06/19/2017 9:50 AM   [name] Jul 01, 1946 [id number]  Referring provider: Owens Loffler, MD [address] [place], [place] [address]  Chief Complaint  Patient presents with  . Urinary Frequency    New Patient  . Urinary Incontinence    HPI: [name] is a 71 year old female who was seen in consultation at the request of Dr. Lorelei Pont for evaluation of urinary incontinence.  She presents with a 5-6-year history of progressively worsening urinary incontinence.  She currently complains of urinary frequency with onset of urge which she is unable to suppress.  She has nocturia x2-4 and occasional sensation of incomplete emptying.  She also complains of incontinence with coughing, laughing, sneezing and bending.  It is difficult for her to assess which of these components are worse.  She presently uses 3-4 pads per day and does complain of skin irritation secondary to moisture.  Denies prior hysterectomy.  She has had 2 prior spontaneous vaginal deliveries.  Denies constipation, diarrhea or fecal incontinence.  Denies gross hematuria.  There is no history of recurrent UTIs.  She denies flank, abdominal or pelvic pain.  She rates the severity of her problem as moderate to severe.   PMH: Past Medical History:  Diagnosis Date  . Acne rosacea   . Anxiety   . Asthma   . Atrial fibrillation and flutter ([place]) 09/2014   Revereted from Afib RVR to 2:1 A Flutter -- TEE/ DCCV 2/9; on Amiodarone  . 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 Mod LA dilation, normal PA pressures   . CVA (cerebral infarction)    h/o superior cerebellar infarct  . Depression   . Digoxin toxicity 09/2014  . 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 Diverticulosis   . H/O: rheumatic fever    Childhood  . HYPERLIPIDEMIA 04/05/2010   Qualifier: Diagnosis of  By: Lorelei Pont MD, Frederico Hamman    . Hypothyroid    On replacement therapy; normal TSH 09/2014  . Migraine headache   . Osteopenia   . Paroxysmal atrial fibrillation ([place]) 2005; Recurrent 09/2014   a) 2005: s/p [facility] Maze & LAA ligation; b) 09/2014: TEE-cardioversion  . Rheumatic mitral and aortic valve insufficiency 2005   a) s/p MV Ring repair; with Cox-Maze for Afib; b) Echo 2013: EF 60-65%,no Regional WMA, Gr 2 DD, no Sig MR ;; c) TEE 10/07/2014: Severlely thickened and calcified MV leaflets. Posterior MV leaflet is fixed and immobile. MAC. reduced excursion of the anterior MV leaflet. Mean MV gradient was 36m Hg and MVA calculated at cm2.    . Urinary incontinence     Surgical History: Past Surgical History:  Procedure Laterality Date  . ABDOMINAL HYSTERECTOMY  1980s   (no cancer),ovaries present  . CARDIAC CATHETERIZATION  Jan 2005   Pre-op R&LHC -- Nonobstructive CAD; normal PA pressures  . CARDIOVERSION  06/04/2012   Procedure: CARDIOVERSION;  Surgeon: JCarlena Bjornstad MD;  [facility];  Service: Cardiovascular;  Laterality: N/A;  . CARDIOVERSION N/A 10/07/2014   Procedure: CARDIOVERSION;  Surgeon: Sueanne Margarita, MD;  Location: [facility] ENDOSCOPY;  Service: Cardiovascular;  Laterality: N/A;  . LUMBAR DISC SURGERY     x 2 distantly  . MITRAL VALVE REPAIR  2005   with [facility] for [employer]  . NM MYOVIEW LTD  4/7/'16   EF 37% with septal hypokinesis and diffuse hypokinesis. No ischemia or infarction. 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  Marland Kitchen  RIGHT HEART CATHETERIZATION N/A 10/03/2014   Procedure: RIGHT HEART CATH;  Surgeon: Jolaine Artist, MD;  Location: [facility] CATH LAB;  Service: Cardiovascular;  RAP 66mHg, RVP 35/2/9 mmHg, PAP 45/12 mmHg, PCWP 16 mmHg; CO/I by Fick: 3.9/2.3; Ao/PA/SVC SaO2%: 97%/63%/65%.  . TEE WITHOUT CARDIOVERSION N/A 10/07/2014   Procedure: TRANSESOPHAGEAL ECHOCARDIOGRAM (TEE);  Surgeon: TSueanne Margarita MD;  [facility];  Service: Cardiovascular;  Laterality: N/A;  . THYROIDECTOMY, PARTIAL     partial-no cancer; now on thyroid replacement  . TRANSTHORACIC ECHOCARDIOGRAM  2013; 2/'16 & 5/'16   a) 2013: EF 60-65%, mild MR, Gr 2 DD; b) [facility] [facility] [place] ~10% - severel global LV dysfunction (systolic & diastolic) - dilated LV & restrictive filling pattern - Gr 3 DD, mod reduced RV function, severely dilated LA - 6.4 cm, mod dilated RA, mod pl effusion, mod MR, mild TR; c) 5/10/'16:  EF 25-30%, mod LV dilation, no RWMA,Gr 3 DD w/ high LAP, MV sewing ring intact w/ Mod MR, Severe LA dilation  . TRANSTHORACIC ECHOCARDIOGRAM  January 2017:   EF improved to 45-50%. Mild diffuse HK. Mild MR. Moderate LA dilation. Normal PA pressures.    Home Medications:  Allergies as of 06/19/2017   No Known Allergies     Medication List       Accurate as of 06/19/17  9:50 AM. Always use your most recent med list.          acetaminophen 325 MG tablet Commonly known as:  TYLENOL Take 2 tablets (650 mg total) by mouth every 4 (four) hours as needed for mild pain, fever or headache.   amiodarone 200 MG tablet Commonly known as:  PACERONE Take 1 tablet (200 mg total) by mouth daily.   BIOTIN PO Take by mouth.   CALCIUM + D PO Take 2 capsules by mouth daily.   carvedilol 3.125 MG tablet Commonly known as:  COREG TAKE ONE TABLET BY MOUTH TWICE DAILY   CRANBERRY PO Take by mouth.   donepezil 5 MG tablet Commonly known as:  ARICEPT TAKE ONE TABLET BY MOUTH AT BEDTIME   FISH OIL PO Take 1 capsule by mouth  daily.   IRON (FERROUS SULFATE) PO Take 1 tablet by mouth daily.   levothyroxine 25 MCG tablet Commonly known as:  SYNTHROID, LEVOTHROID TAKE TWO AND ONE-HALF TABLETS BY MOUTH DAILY BEFORE BREAKFAST   losartan 25 MG tablet Commonly known as:  COZAAR TAKE ONE TABLET BY MOUTH EVERY DAY   MAGNESIUM PO Take by mouth.   polyethylene glycol powder powder Commonly known as:  GLYCOLAX/MIRALAX   potassium chloride SA 20 MEQ tablet Commonly known as:  K-DUR,KLOR-CON Take 20 mEq by mouth daily.   PRESERVISION AREDS 2 PO Take by mouth.   solifenacin 5 MG tablet Commonly known as:  VESICARE Take 1 tablet (5 mg total) by mouth daily.   VITAMIN B-12 PO Take 1 tablet by mouth daily.  VITAMIN C PO Take by mouth.   VITAMIN D PO Take by mouth.   warfarin 5 MG tablet Commonly known as:  COUMADIN Take 1 to 1 and 1/2 tablets every day as directed by coumadin clinic       Allergies: No Known Allergies  Family History: Family History  Problem Relation Age of Onset  . Diabetes Mother   . Coronary artery disease Mother   . Kidney disease Mother   . Breast cancer Neg Hx     Social History:  reports that she has never smoked. She has never used smokeless tobacco. She reports that she does not drink alcohol or use drugs.  ROS: UROLOGY Frequent Urination?: Yes Hard to postpone urination?: No Burning/pain with urination?: No Get up at night to urinate?: Yes Leakage of urine?: Yes Urine stream starts and stops?: No Trouble starting stream?: No Do you have to strain to urinate?: No Blood in urine?: No Urinary tract infection?: No Sexually transmitted disease?: No Injury to kidneys or bladder?: No Painful intercourse?: No Weak stream?: No Currently pregnant?: No Vaginal bleeding?: No Last menstrual period?: n  Gastrointestinal Nausea?: No Vomiting?: No Indigestion/heartburn?: No Diarrhea?: No Constipation?: No  Constitutional Fever: No Night sweats?: No Weight  loss?: No Fatigue?: No  Skin Skin rash/lesions?: No Itching?: No  Eyes Blurred vision?: No Double vision?: No  Ears/Nose/Throat Sore throat?: No Sinus problems?: Yes  Hematologic/Lymphatic Swollen glands?: No Easy bruising?: Yes  Cardiovascular Leg swelling?: No Chest pain?: No  Respiratory Cough?: No Shortness of breath?: No  Endocrine Excessive thirst?: No  Musculoskeletal Back pain?: No Joint pain?: Yes  Neurological Headaches?: No Dizziness?: No  Psychologic Depression?: No Anxiety?: Yes  Physical Exam: BP (!) 89/48   Pulse (!) 52   Ht 5' 9"  (1.753 m)   Wt 162 lb (73.5 kg)   BMI 23.92 kg/m   Constitutional:  Alert and oriented, No acute distress. HEENT: [place] AT, moist mucus membranes.  Trachea midline, no masses. Cardiovascular: No clubbing, cyanosis, or edema. Respiratory: Normal respiratory effort, no increased work of breathing.  RRR w/o murmur GI: Abdomen is soft, nontender, nondistended, no abdominal masses GU: No CVA tenderness. Skin: No rashes, bruises or suspicious lesions. Lymph: No cervical or inguinal adenopathy. Neurologic: Grossly intact, no focal deficits, moving all 4 extremities. Psychiatric: Normal mood and affect.  Laboratory Data: Lab Results  Component Value Date   WBC 4.8 05/16/2017   HGB 13.7 05/16/2017   HCT 41.3 05/16/2017   MCV 103.1 (H) 05/16/2017   PLT 189.0 05/16/2017    Lab Results  Component Value Date   CREATININE 0.85 05/16/2017    Urinalysis Lab Results  Component Value Date   SPECGRAV 1.025 09/10/2012   PHUR 6.0 09/10/2012   COLORU YELLOW 09/10/2012   APPEARANCEUR CLEAR 04/29/2015   LEUKOCYTESUR NEGATIVE 04/29/2015   PROTEINUR NEGATIVE 04/29/2015   GLUCOSEU NEGATIVE 04/29/2015   KETONESU NEG 09/10/2012   BILIRUBINUR NEGATIVE 04/29/2015   NITRITE NEGATIVE 04/29/2015     Assessment & Plan:   1.  Mixed incontinence urge and stress Urinalysis today was unremarkable.  PVR by bladder scan was 0  mL.  Her symptoms are quite bothersome and will initially give a trial of Vesicare 5 mg daily.  Follow-up in 1 month for reassessment of her symptoms.  She was informed that this medication will not help her stress incontinence.  Other potential treatment options were discussed including pelvic floor rehabilitation which may be beneficial for both forms of incontinence and surgical  management of her stress incontinence.  Cystoscopy on follow-up  - Urinalysis, Complete - BLADDER SCAN AMB NON-IMAGING  2. Urinary frequency   3. Nocturia   Return in about 4 weeks (around 07/17/2017) for cystoscopy.  Abbie Sons, [place] [address] [place], [place] [address] [phone]

## 2017-06-23 ENCOUNTER — Telehealth: Payer: Self-pay | Admitting: *Deleted

## 2017-06-23 NOTE — Telephone Encounter (Signed)
Spoke with patient and gave her the Dr. Eston Esters, patient laughs and states she will try the Detrol and let us now how that works for her. Patient ok with this plan.

## 2017-06-23 NOTE — Telephone Encounter (Signed)
Uroxatrol is indicated for prostate enlargement in men and not overactive bladder in women and would not recommend this medication as it would not be beneficial.

## 2017-06-23 NOTE — Telephone Encounter (Signed)
Patient called and left message on voicemail that she needed a less expensive medication. I looked in the chart and this has already been addressed and Dr. Bernardo Heater had changed the vesicare to [facility] and it was sent to Pharmacy. After speaking with the patient she states the less expensive drug for her and the one she wants is the Uroxalvol (spelling from previous message of names received from insurance). I let her know that I would send the message To Dr. Bernardo Heater for his approval. Patient ok with plan.

## 2017-06-28 ENCOUNTER — Telehealth: Payer: Self-pay | Admitting: Family Medicine

## 2017-06-28 NOTE — Telephone Encounter (Signed)
Left message asking pt to call office to schedule bone density

## 2017-07-05 ENCOUNTER — Telehealth: Payer: Self-pay

## 2017-07-05 ENCOUNTER — Ambulatory Visit (INDEPENDENT_AMBULATORY_CARE_PROVIDER_SITE_OTHER): Payer: Medicare HMO

## 2017-07-05 DIAGNOSIS — I4891 Unspecified atrial fibrillation: Secondary | ICD-10-CM

## 2017-07-05 DIAGNOSIS — I4892 Unspecified atrial flutter: Secondary | ICD-10-CM | POA: Diagnosis not present

## 2017-07-05 DIAGNOSIS — Z5181 Encounter for therapeutic drug level monitoring: Secondary | ICD-10-CM | POA: Diagnosis not present

## 2017-07-05 DIAGNOSIS — Z7901 Long term (current) use of anticoagulants: Secondary | ICD-10-CM

## 2017-07-05 DIAGNOSIS — I480 Paroxysmal atrial fibrillation: Secondary | ICD-10-CM | POA: Diagnosis not present

## 2017-07-05 LAB — POCT INR: INR: 3.8

## 2017-07-05 MED ORDER — TOLTERODINE TARTRATE ER 4 MG PO CP24: 4.0000 mg | ORAL_CAPSULE | Freq: Every day | ORAL | 2 refills | Status: DC

## 2017-07-05 NOTE — Telephone Encounter (Signed)
Pt called stating her Detrol [place] co pay was to expensive. Per the formulation chart for medicare pts, pt should be able to get medication cheaper at a chain pharmacy. Medication was sent to [facility] [place] for pt. Pt will call back if the medication still cost to much.

## 2017-07-07 ENCOUNTER — Telehealth: Payer: Self-pay | Admitting: *Deleted

## 2017-07-07 NOTE — Telephone Encounter (Signed)
   [facility] Pre-operative Risk Assessment    Request for surgical clearance:  1. What type of surgery is being performed? COLONOSCOPY   2. When is this surgery scheduled? 07-11-17   3. Are there any medications that need to be held prior to surgery and how long?WARFARIN-NEED DIRECTIONS   4. Practice name and name of physician performing surgery? Madolyn Frieze MD   5. What is your office phone and fax number? [phone]  [phone]   6. Anesthesia type (None, local, MAC, general) ? UNKNOWN   Fredia Beets 07/07/2017, 6:53 AM  _________________________________________________________________   (provider comments below)

## 2017-07-07 NOTE — Telephone Encounter (Signed)
Patient with diagnosis of atrial fibrillation on warfarin for anticoagulation.    Procedure: colonoscopy Date of procedure: 07/11/17  CHADS2-VASc score of  4 (CHF, AGE, female)  CrCl ** Platelet count **  Per office protocol, patient can hold warfarin for 4 days prior to procedure.   Patient will not need bridging with Lovenox (enoxaparin) around procedure.  If not bridging, patient should restart warfarin on the evening of procedure or day after, at discretion of procedure MD

## 2017-07-10 ENCOUNTER — Ambulatory Visit
Admission: RE | Admit: 2017-07-10 | Discharge: 2017-07-10 | Disposition: A | Discharge status: Home / Self Care | Payer: Medicare HMO | Source: Ambulatory Visit | Attending: Family Medicine | Admitting: Family Medicine

## 2017-07-10 ENCOUNTER — Encounter: Payer: Self-pay | Admitting: Cardiovascular Disease

## 2017-07-10 ENCOUNTER — Telehealth: Payer: Self-pay | Admitting: Internal Medicine

## 2017-07-10 ENCOUNTER — Other Ambulatory Visit: Payer: Medicare HMO

## 2017-07-10 DIAGNOSIS — Z1231 Encounter for screening mammogram for malignant neoplasm of breast: Secondary | ICD-10-CM | POA: Insufficient documentation

## 2017-07-10 DIAGNOSIS — Z1239 Encounter for other screening for malignant neoplasm of breast: Secondary | ICD-10-CM

## 2017-07-10 NOTE — Telephone Encounter (Signed)
   Patient with diagnosis of atrial fibrillation on warfarin for anticoagulation.    Procedure: colonoscopy Date of procedure: 07/11/17  CHADS2-VASc score of  3 (CHF, AGE, female)  CrCl 69.4 Platelet count 189  Per office protocol, patient can hold warfarin for 4 days prior to procedure.   Patient will not need bridging with Lovenox (enoxaparin) around procedure.  If not bridging, patient should restart warfarin on the evening of procedure or day after, at discretion of procedure MD

## 2017-07-10 NOTE — Telephone Encounter (Signed)
Routed note for pre-op clearance addressing anti-coag to [facility]

## 2017-07-10 NOTE — Telephone Encounter (Signed)
See other telephone note:  Rogelia Mire, NP 07/10/17 3:04 PM:  Note Original request was for coming off of anticoagulation only. As pt hasn't been seen in clinic in nearly a year, she would require a f/u visit for medical clearance.  Tried to call clinic they are closed will have to call in the am.

## 2017-07-10 NOTE — Telephone Encounter (Signed)
Correction:  CHADS2-VASc score of 3 (CHF, AGE, female)  CrCl; 69.4 Platelet count: 189

## 2017-07-10 NOTE — Telephone Encounter (Signed)
Original request was for coming off of anticoagulation only. As pt hasn't been seen in clinic in nearly a year, she would require a f/u visit for medical clearance.

## 2017-07-10 NOTE — Telephone Encounter (Signed)
This encounter was created in error - please disregard.

## 2017-07-10 NOTE — Telephone Encounter (Signed)
See other telephone note: Rogelia Mire, NP      07/10/17 3:04 PM   Note Original request was for coming off of anticoagulation only. As pt hasn't been seen in clinic in nearly a year, she would require a f/u visit for medical clearance.

## 2017-07-10 NOTE — Telephone Encounter (Signed)
Is pt cleared medically for colonoscopy tomorrow? Procedure will need to be cancelled if not cleared-per fax received today

## 2017-07-11 ENCOUNTER — Encounter: Payer: Self-pay | Admitting: Anesthesiology

## 2017-07-11 ENCOUNTER — Ambulatory Visit
Admission: RE | Admit: 2017-07-11 | Discharge: 2017-07-11 | Disposition: A | Discharge status: Home / Self Care | Payer: Medicare HMO | Source: Ambulatory Visit | Attending: Internal Medicine | Admitting: Internal Medicine

## 2017-07-11 ENCOUNTER — Ambulatory Visit: Payer: Medicare HMO | Admitting: Anesthesiology

## 2017-07-11 ENCOUNTER — Encounter
Admission: RE | Disposition: A | Discharge status: Home / Self Care | Payer: Self-pay | Source: Ambulatory Visit | Attending: Internal Medicine

## 2017-07-11 DIAGNOSIS — Z1211 Encounter for screening for malignant neoplasm of colon: Secondary | ICD-10-CM | POA: Insufficient documentation

## 2017-07-11 DIAGNOSIS — K648 Other hemorrhoids: Secondary | ICD-10-CM | POA: Diagnosis not present

## 2017-07-11 DIAGNOSIS — E039 Hypothyroidism, unspecified: Secondary | ICD-10-CM | POA: Insufficient documentation

## 2017-07-11 DIAGNOSIS — E785 Hyperlipidemia, unspecified: Secondary | ICD-10-CM | POA: Insufficient documentation

## 2017-07-11 DIAGNOSIS — K219 Gastro-esophageal reflux disease without esophagitis: Secondary | ICD-10-CM | POA: Diagnosis not present

## 2017-07-11 DIAGNOSIS — I5042 Chronic combined systolic (congestive) and diastolic (congestive) heart failure: Secondary | ICD-10-CM | POA: Insufficient documentation

## 2017-07-11 DIAGNOSIS — R69 Illness, unspecified: Secondary | ICD-10-CM | POA: Diagnosis not present

## 2017-07-11 DIAGNOSIS — I351 Nonrheumatic aortic (valve) insufficiency: Secondary | ICD-10-CM | POA: Diagnosis not present

## 2017-07-11 DIAGNOSIS — I480 Paroxysmal atrial fibrillation: Secondary | ICD-10-CM | POA: Diagnosis not present

## 2017-07-11 DIAGNOSIS — M1990 Unspecified osteoarthritis, unspecified site: Secondary | ICD-10-CM | POA: Diagnosis not present

## 2017-07-11 DIAGNOSIS — D125 Benign neoplasm of sigmoid colon: Secondary | ICD-10-CM | POA: Diagnosis not present

## 2017-07-11 DIAGNOSIS — K5730 Diverticulosis of large intestine without perforation or abscess without bleeding: Secondary | ICD-10-CM | POA: Insufficient documentation

## 2017-07-11 DIAGNOSIS — Z7901 Long term (current) use of anticoagulants: Secondary | ICD-10-CM | POA: Insufficient documentation

## 2017-07-11 DIAGNOSIS — Z79899 Other long term (current) drug therapy: Secondary | ICD-10-CM | POA: Diagnosis not present

## 2017-07-11 DIAGNOSIS — Z952 Presence of prosthetic heart valve: Secondary | ICD-10-CM | POA: Insufficient documentation

## 2017-07-11 DIAGNOSIS — Z8673 Personal history of transient ischemic attack (TIA), and cerebral infarction without residual deficits: Secondary | ICD-10-CM | POA: Diagnosis not present

## 2017-07-11 DIAGNOSIS — K635 Polyp of colon: Secondary | ICD-10-CM | POA: Diagnosis not present

## 2017-07-11 DIAGNOSIS — I509 Heart failure, unspecified: Secondary | ICD-10-CM | POA: Diagnosis not present

## 2017-07-11 DIAGNOSIS — K5790 Diverticulosis of intestine, part unspecified, without perforation or abscess without bleeding: Secondary | ICD-10-CM | POA: Diagnosis not present

## 2017-07-11 HISTORY — PX: COLONOSCOPY WITH PROPOFOL: SHX5780

## 2017-07-11 SURGERY — COLONOSCOPY WITH PROPOFOL
Anesthesia: General

## 2017-07-11 MED ORDER — FENTANYL CITRATE (PF) 100 MCG/2ML IJ SOLN
INTRAMUSCULAR | Status: DC | PRN
  Administered 2017-07-11: 50 ug via INTRAVENOUS
  Administered 2017-07-11: 25 ug via INTRAVENOUS

## 2017-07-11 MED ORDER — SODIUM CHLORIDE 0.9 % IV SOLN
INTRAVENOUS | Status: DC
  Administered 2017-07-11: 1000 mL via INTRAVENOUS

## 2017-07-11 MED ORDER — PROPOFOL 500 MG/50ML IV EMUL
INTRAVENOUS | Status: DC | PRN
  Administered 2017-07-11: 100 ug/kg/min via INTRAVENOUS

## 2017-07-11 MED ORDER — MIDAZOLAM HCL 2 MG/2ML IJ SOLN
INTRAMUSCULAR | Status: DC | PRN
  Administered 2017-07-11: 2 mg via INTRAVENOUS

## 2017-07-11 MED ORDER — PROPOFOL 500 MG/50ML IV EMUL
INTRAVENOUS | Status: AC
  Filled 2017-07-11: qty 50

## 2017-07-11 MED ORDER — MIDAZOLAM HCL 2 MG/2ML IJ SOLN
INTRAMUSCULAR | Status: AC
  Filled 2017-07-11: qty 2

## 2017-07-11 MED ORDER — FENTANYL CITRATE (PF) 100 MCG/2ML IJ SOLN
INTRAMUSCULAR | Status: AC
  Filled 2017-07-11: qty 2

## 2017-07-11 NOTE — Op Note (Signed)
[facility] Patient Name: [name] Procedure Date: 07/11/2017 1:18 PM MRN: [id number] Account #: [id number] Date of Birth: 12-29-1945 Admit Type: Outpatient Age: 71 Room: [facility] ENDO ROOM 4 Gender: Female Note Status: Finalized Procedure:            Colonoscopy Indications:          Screening for malignant neoplasm in the colon Providers:            Benay Pike. Alice Reichert MD, MD Referring MD:         Maud Deed. [name] MD, MD (Referring MD) Medicines:            Propofol per Anesthesia Complications:        No immediate complications. Procedure:            Pre-Anesthesia Assessment:                       - The risks and benefits of the procedure and the                        sedation options and risks were discussed with the                        patient. All questions were answered and informed                        consent was obtained.                       - Patient identification and proposed procedure were                        verified prior to the procedure by the nurse. The                        procedure was verified in the procedure room.                       - ASA Grade Assessment: III - A patient with severe                        systemic disease.                       - After reviewing the risks and benefits, the patient                        was deemed in satisfactory condition to undergo the                        procedure.                       After obtaining informed consent, the colonoscope was                        passed under direct vision. Throughout the procedure,                        the patient's blood pressure, pulse, and oxygen  saturations were monitored continuously. The Olympus                        CF-H180AL colonoscope ( S#: [id number] ) was introduced                        through the anus and advanced to the the cecum,                        identified by appendiceal orifice and  ileocecal valve. Findings:      The perianal and digital rectal examinations were normal.      Multiple small and large-mouthed diverticula were found in the entire       colon. There was no evidence of diverticular bleeding.      The entire examined colon appeared normal.      A 4 mm polyp was found in the sigmoid colon. The polyp was sessile. The       polyp was removed with a hot snare. Resection and retrieval were       complete.      The exam was otherwise without abnormality. Impression:           - Moderate diverticulosis in the entire examined colon.                        There was no evidence of diverticular bleeding.                       - The entire examined colon is normal.                       - One 4 mm polyp in the sigmoid colon, removed with a                        hot snare. Resected and retrieved.                       - The examination was otherwise normal. Recommendation:       - Patient has a contact number available for                        emergencies. The signs and symptoms of potential                        delayed complications were discussed with the patient.                        Return to normal activities tomorrow. Written discharge                        instructions were provided to the patient.                       - Resume previous diet.                       - Continue present medications.                       - Await pathology results.                       -  Repeat colonoscopy for surveillance based on                        pathology results.                       - Return to GI clinic PRN.                       - Resume Coumadin (warfarin) at prior dose today. Refer                        to managing physician for further adjustment of therapy.                       - The findings and recommendations were discussed with                        the patient and their spouse. Procedure Code(s):    --- Professional ---                        [phone], Colonoscopy, flexible; with removal of tumor(s),                        polyp(s), or other lesion(s) by snare technique Diagnosis Code(s):    --- Professional ---                       K57.30, Diverticulosis of large intestine without                        perforation or abscess without bleeding                       D12.5, Benign neoplasm of sigmoid colon                       Z12.11, Encounter for screening for malignant neoplasm                        of colon CPT copyright 2016 American Medical Association. All rights reserved. The codes documented in this report are preliminary and upon coder review may  be revised to meet current compliance requirements. Efrain Sella MD, MD 07/11/2017 1:48:11 PM This report has been signed electronically. Number of Addenda: 0 Note Initiated On: 07/11/2017 1:18 PM Scope Withdrawal Time: 0 hours 8 minutes 51 seconds  Total Procedure Duration: 0 hours 16 minutes 45 seconds       [facility]

## 2017-07-11 NOTE — Interval H&P Note (Signed)
History and Physical Interval Note:  07/11/2017 1:19 PM  [name]  has presented today for surgery, with the diagnosis of COLON CANCER SCREENING  The various methods of treatment have been discussed with the patient and family. After consideration of risks, benefits and other options for treatment, the patient has consented to  Procedure(s): COLONOSCOPY WITH PROPOFOL (N/A) as a surgical intervention .  The patient's history has been reviewed, patient examined, no change in status, stable for surgery.  I have reviewed the patient's chart and labs.  Questions were answered to the patient's satisfaction.     [place], [place]

## 2017-07-11 NOTE — Anesthesia Procedure Notes (Signed)
Performed by: Cook-Martin, Zeph Riebel Pre-anesthesia Checklist: Patient identified, Emergency Drugs available, Suction available, Patient being monitored and Timeout performed Patient Re-evaluated:Patient Re-evaluated prior to induction Oxygen Delivery Method: Nasal cannula Preoxygenation: Pre-oxygenation with 100% oxygen Induction Type: IV induction Placement Confirmation: CO2 detector and positive ETCO2       

## 2017-07-11 NOTE — Anesthesia Postprocedure Evaluation (Signed)
Anesthesia Post Note  Patient: [name]  Procedure(s) Performed: COLONOSCOPY WITH PROPOFOL (N/A )  Patient location during evaluation: Endoscopy Anesthesia Type: General Level of consciousness: awake and alert and oriented Pain management: pain level controlled Vital Signs Assessment: post-procedure vital signs reviewed and stable Respiratory status: spontaneous breathing, nonlabored ventilation and respiratory function stable Cardiovascular status: blood pressure returned to baseline and stable Postop Assessment: no signs of nausea or vomiting Anesthetic complications: no     Last Vitals:  Vitals:   07/11/17 1410 07/11/17 1420  BP: 135/74 117/68  Pulse: (!) 51 (!) 55  Resp: 17 16  Temp:    SpO2: 98% 99%    Last Pain:  Vitals:   07/11/17 1350  TempSrc: Tympanic                 Bertie Simien

## 2017-07-11 NOTE — Anesthesia Preprocedure Evaluation (Signed)
Anesthesia Evaluation  Patient identified by MRN, date of birth, ID band Patient awake    Reviewed: Allergy & Precautions, NPO status , Patient's Chart, lab work & pertinent test results  History of Anesthesia Complications Negative for: history of anesthetic complications  Airway Mallampati: II  TM Distance: >3 FB Neck ROM: Full    Dental no notable dental hx.    Pulmonary neg pulmonary ROS, neg sleep apnea, neg COPD,    breath sounds clear to auscultation- rhonchi (-) wheezing      Cardiovascular Exercise Tolerance: Good (-) hypertension(-) angina+CHF  (-) CAD, (-) Past MI and (-) Cardiac Stents + dysrhythmias Atrial Fibrillation  Rhythm:Regular Rate:Normal - Systolic murmurs and - Diastolic murmurs Echo 05/02/99: - Left ventricle: The cavity size was mildly dilated. Systolic   function was mildly reduced. The estimated ejection fraction was   in the range of 45% to 50%. Mild diffuse hypokinesis. The study   is not technically sufficient to allow evaluation of LV diastolic   function. - Aortic valve: There was trivial regurgitation. - Mitral valve: Moderately thickened leaflets . Transvalvular   velocity was within the normal range. There was no evidence for   stenosis. There was mild regurgitation. - Left atrium: The atrium was moderately dilated. - Right ventricle: Systolic function was normal. - Pulmonary arteries: Systolic pressure was within the normal   range.   Neuro/Psych  Headaches, PSYCHIATRIC DISORDERS Anxiety Depression    GI/Hepatic Neg liver ROS, GERD  ,  Endo/Other  neg diabetesHypothyroidism   Renal/GU negative Renal ROS     Musculoskeletal  (+) Arthritis ,   Abdominal (+) - obese,   Peds  Hematology negative hematology ROS (+)   Anesthesia Other Findings Past Medical History: No date: Acne rosacea No date: Anxiety No date: Asthma 09/2014: Atrial fibrillation and flutter ([place])     Comment:   Revereted from Afib RVR to 2:1 A Flutter -- TEE/ DCCV               2/9; on Amiodarone 09/2014: Chronic combined systolic and diastolic CHF, NYHA class 1  (HCC) -- Systolic dysfunction [id number](A26.33)     Comment:  Exacerbation 09/2014 2/2 Afib/flutter with RVR - likely               Tachycardia induced Cardiomyopathy; Echo 12/2014: EF               25-30%, no RWMA ;; ECHO 08/2015: EF 45-50% with mild HK.               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 ([place])     Comment:  a) [facility] Echo: EF ~10% Severe LV dilation & global LV               Systolic dysfxn, restrictive filling pattern - Gr 3 DD,               mod RV dysfxn, severe LA dilation - 6.4 cm, mod RA dil,               mod pl effusion, mod MR, mild TR; b) TEE 10/07/14: EF ~10%,              Severe LV dilation & dysfxn Mod RV dysfxn, LAA oversewn,  Mod RA & TR No date: Diverticulosis No date: H/O: rheumatic fever     Comment:  Childhood 04/05/2010: HYPERLIPIDEMIA     Comment:  Qualifier: Diagnosis of  By: [name] MD, Frederico Hamman   No date: Hypothyroid     Comment:  On replacement therapy; normal TSH 09/2014 No date: Migraine headache No date: Osteopenia 2005; Recurrent 09/2014: Paroxysmal atrial fibrillation ([place])     Comment:  a) 2005: s/p Cox Maze & LAA ligation; b) 09/2014:               TEE-cardioversion 2005: Rheumatic mitral and aortic valve insufficiency     Comment:  a) s/p MV Ring repair; with Cox-Maze for Afib; b) Echo               2013: EF 60-65%,no Regional WMA, Gr 2 DD, no Sig MR ;; c)              TEE 10/07/2014: Severlely thickened and calcified MV               leaflets. Posterior MV leaflet is fixed and immobile.               MAC. reduced excursion of the anterior MV leaflet. Mean              MV gradient was 59m Hg and  MVA calculated at cm2.   No date: Urinary incontinence   Reproductive/Obstetrics                             Anesthesia Physical Anesthesia Plan  ASA: III  Anesthesia Plan: General   Post-op Pain Management:    Induction: Intravenous  PONV Risk Score and Plan: 2 and Propofol infusion  Airway Management Planned: Natural Airway  Additional Equipment:   Intra-op Plan:   Post-operative Plan:   Informed Consent: I have reviewed the patients History and Physical, chart, labs and discussed the procedure including the risks, benefits and alternatives for the proposed anesthesia with the patient or authorized representative who has indicated his/her understanding and acceptance.   Dental advisory given  Plan Discussed with: CRNA and Anesthesiologist  Anesthesia Plan Comments:         Anesthesia Quick Evaluation

## 2017-07-11 NOTE — Transfer of Care (Signed)
Immediate Anesthesia Transfer of Care Note  Patient: [name]  Procedure(s) Performed: COLONOSCOPY WITH PROPOFOL (N/A )  Patient Location: Endoscopy Unit  Anesthesia Type:General  Level of Consciousness: awake and alert   Airway & Oxygen Therapy: Patient Spontanous Breathing and Patient connected to nasal cannula oxygen  Post-op Assessment: Report given to RN and Post -op Vital signs reviewed and stable  Post vital signs: Reviewed and stable  Last Vitals:  Vitals:   07/11/17 1410 07/11/17 1420  BP: 135/74 117/68  Pulse: (!) 51 (!) 55  Resp: 17 16  Temp:    SpO2: 98% 99%    Last Pain:  Vitals:   07/11/17 1350  TempSrc: Tympanic         Complications: No apparent anesthesia complications

## 2017-07-11 NOTE — Anesthesia Post-op Follow-up Note (Signed)
Anesthesia QCDR form completed.        

## 2017-07-11 NOTE — H&P (Signed)
Outpatient short stay form Pre-procedure 07/11/2017 1:17 PM [name] [name], M.D.  Primary Physician: Owens Loffler, M.D.  Reason for visit:  Colon cancer screening  History of present illness:  Patient with a hx of atrial fibrillation and s/p MVR on coumadin presents for colon cancer screening. Patient denies change in bowel habits, rectal bleeding and abdominal pain.    Current Facility-Administered Medications:  .  0.9 %  sodium chloride infusion, , Intravenous, Continuous, [place], Benay Pike, MD, Last Rate: 20 mL/hr at 07/11/17 1310, 1,000 mL at 07/11/17 1310  Medications Prior to Admission  Medication Sig Dispense Refill Last Dose  . acetaminophen (TYLENOL) 325 MG tablet Take 2 tablets (650 mg total) by mouth every 4 (four) hours as needed for mild pain, fever or headache. 30 tablet 0 07/11/2017 at 230  . amiodarone (PACERONE) 200 MG tablet Take 1 tablet (200 mg total) by mouth daily. 90 tablet 3 07/11/2017 at 530  . Ascorbic Acid (VITAMIN C PO) Take by mouth.   Past Week at    . b complex vitamins tablet Take 1 tablet daily by mouth.     . benzonatate (TESSALON) 200 MG capsule Take 200 mg 3 (three) times daily as needed by mouth for cough.   Past Week at Unknown time  . Calcium Citrate-Vitamin D (CALCIUM + D PO) Take 1 capsule daily by mouth.    Past Week at Unknown time  . carvedilol (COREG) 3.125 MG tablet TAKE ONE TABLET BY MOUTH TWICE DAILY 180 tablet 3 07/11/2017 at Unknown time  . CRANBERRY PO Take by mouth.   Past Week at Unknown time  . donepezil (ARICEPT) 5 MG tablet TAKE ONE TABLET BY MOUTH AT BEDTIME 90 tablet 3 07/10/2017 at Unknown time  . levothyroxine (SYNTHROID, LEVOTHROID) 25 MCG tablet TAKE TWO AND ONE-HALF TABLETS BY MOUTH DAILY BEFORE BREAKFAST 225 tablet 3 07/11/2017 at Unknown time  . losartan (COZAAR) 25 MG tablet TAKE ONE TABLET BY MOUTH EVERY DAY 90 tablet 3 07/11/2017 at Unknown time  . MAGNESIUM PO Take by mouth.   Past Week at Unknown time  . Multiple  Vitamins-Minerals (PRESERVISION AREDS 2 PO) Take by mouth.   Past Week at Unknown time  . Omega-3 Fatty Acids (FISH OIL PO) Take 1 capsule by mouth daily.   Past Week at Unknown time  . polyethylene glycol powder (GLYCOLAX/MIRALAX) powder    07/10/2017 at Unknown time  . potassium chloride SA (K-DUR,KLOR-CON) 20 MEQ tablet Take 20 mEq by mouth daily.    07/11/2017 at Unknown time  . warfarin (COUMADIN) 5 MG tablet Take 1 to 1 and 1/2 tablets every day as directed by coumadin clinic 45 tablet 5 Past Week at Unknown time  . IRON, FERROUS SULFATE, PO Take 1 tablet by mouth daily.   Taking     No Known Allergies   Past Medical History:  Diagnosis Date  . Acne rosacea   . Anxiety   . Asthma   . Atrial fibrillation and flutter ([place]) 09/2014   Revereted from Afib RVR to 2:1 A Flutter -- TEE/ DCCV 2/9; on Amiodarone  . 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 Mod LA dilation, normal PA pressures   . CVA (cerebral infarction)    h/o superior cerebellar infarct  . Depression   .  Digoxin toxicity 09/2014  . 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 Diverticulosis   . H/O: rheumatic fever    Childhood  . HYPERLIPIDEMIA 04/05/2010   Qualifier: Diagnosis of  By: Lorelei Pont MD, Frederico Hamman    . Hypothyroid    On replacement therapy; normal TSH 09/2014  . Migraine headache   . Osteopenia   . Paroxysmal atrial fibrillation ([place]) 2005; Recurrent 09/2014   a) 2005: s/p  Cox Maze & LAA ligation; b) 09/2014: TEE-cardioversion  . Rheumatic mitral and aortic valve insufficiency 2005   a) s/p MV Ring repair; with Cox-Maze for Afib; b) Echo 2013: EF 60-65%,no Regional WMA, Gr 2 DD, no Sig MR ;; c) TEE 10/07/2014: Severlely thickened and calcified MV leaflets. Posterior MV leaflet is fixed and immobile. MAC. reduced excursion of the anterior MV leaflet. Mean MV gradient was 46m Hg and MVA calculated at cm2.    . Urinary incontinence     Review of systems:      Physical Exam  General appearance: alert, cooperative and appears stated age Resp: clear to auscultation bilaterally Cardio: regular rate and rhythm, S1, S2 normal, no murmur, click, rub or gallop GI: soft, non-tender; bowel sounds normal; no masses,  no organomegaly     Planned procedures: Screening colonoscopy. The patient understands the nature of the planned procedure, indications, risks, alternatives and potential complications including but not limited to bleeding, infection, perforation, damage to internal organs and possible oversedation/side effects from anesthesia. The patient agrees and gives consent to proceed.  Please refer to procedure notes for findings, recommendations and patient disposition/instructions.    Karrin Eisenmenger K. TAlice [name] M.D. Gastroenterology 07/11/2017  1:17 PM

## 2017-07-11 NOTE — Telephone Encounter (Signed)
[facility] s/w Vicky-nurse. She states that she has received a note from [facility] stating cleared for procedure and coag direction. Pt is still having procedure today.

## 2017-07-12 ENCOUNTER — Encounter: Payer: Self-pay | Admitting: Internal Medicine

## 2017-07-12 LAB — SURGICAL PATHOLOGY

## 2017-07-13 ENCOUNTER — Encounter: Payer: Self-pay | Admitting: Physician Assistant

## 2017-07-13 ENCOUNTER — Ambulatory Visit (INDEPENDENT_AMBULATORY_CARE_PROVIDER_SITE_OTHER): Payer: Medicare HMO

## 2017-07-13 ENCOUNTER — Ambulatory Visit (INDEPENDENT_AMBULATORY_CARE_PROVIDER_SITE_OTHER): Payer: Medicare HMO | Admitting: Physician Assistant

## 2017-07-13 VITALS — BP 116/72 | HR 63 | Ht 69.0 in | Wt 161.5 lb

## 2017-07-13 DIAGNOSIS — I4892 Unspecified atrial flutter: Secondary | ICD-10-CM

## 2017-07-13 DIAGNOSIS — I480 Paroxysmal atrial fibrillation: Secondary | ICD-10-CM

## 2017-07-13 DIAGNOSIS — Z5181 Encounter for therapeutic drug level monitoring: Secondary | ICD-10-CM | POA: Diagnosis not present

## 2017-07-13 DIAGNOSIS — I4891 Unspecified atrial fibrillation: Secondary | ICD-10-CM | POA: Diagnosis not present

## 2017-07-13 DIAGNOSIS — I428 Other cardiomyopathies: Secondary | ICD-10-CM | POA: Diagnosis not present

## 2017-07-13 DIAGNOSIS — Z7901 Long term (current) use of anticoagulants: Secondary | ICD-10-CM

## 2017-07-13 DIAGNOSIS — I38 Endocarditis, valve unspecified: Secondary | ICD-10-CM

## 2017-07-13 DIAGNOSIS — E875 Hyperkalemia: Secondary | ICD-10-CM | POA: Diagnosis not present

## 2017-07-13 LAB — POCT INR: INR: 1.3

## 2017-07-13 NOTE — Progress Notes (Signed)
Cardiology Office Note Date:  07/13/2017  Patient ID:  Deborah, [name] [birth year], MRN [id number] PCP:  Owens Loffler, MD  Cardiologist:  Dr. Saunders Revel, MD    Chief Complaint: Follow up  History of Present Illness: [name] is a 71 y.o. 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   [facility] 09/16/2003 showed no significant CAD, EF 50% with severe mitral valve prolapase and MR, RA 3, RV 28/4, PA 21/10 (16), PCWP 12. [place] 09/2014 with RA 5, RV 35/4, PA 45/12 (22), PCWP 16, SvO2 66%, Fick CO/CI 3.9/2.3. She has previously undergone DCCV on 03/12/2004, 06/04/2012, and most recently 10/07/2014. Most recent ischemic evaluation by nucelar stress test on 12/04/2014 showed no ischemia, LVEF 37% with global hypokinesis, most pronounced in the septum. Intermediate risk study due to low EF. Most recent echo from 09/09/2015 showed an EF of 45-50%, trivial AI and mild MR, moderately dilated left atrium, normal RV size and systolic function, PASP normal. This was improved from her echo in 12/2014 that showed an EF of 25-30% with Gr3DD, moderate MR, and severely dilated left atrium.   She was previously followed by Dr. Ellyn Hack, though has transitioned to Dr. Saunders Revel given Dr. Allison Quarry relocation to [place], [place]. She was most recently seen in the office 06/2016 for routine follow up and doing well at that time. PFTs 08/2016 showed mild obstructive airway disease and an interval decline in DLCO. She was referred to pulmonary to determine clinical significance and if the patient needed to stop amiodarone. It was recommended she continued to be monitored without need for treatment at  that time. Most recent INR from 07/05/2017 of 3.8. She is followed in our [facility]. She recently underwent screening colonoscopy 07/11/17 and held Coumadin x 4 days prior.  She comes in doing well today. She underwent her screening colonoscopy on 75/10 without complications. She reports no polyps and is next due for repeat colonoscopy in 10 years. She has restarted her Coumadin as of the evening of 11/14. INR this morning was checked by Coumadin Clinic and noted to be 1.3. This subtherapeutic INR was to be expected given her Coumadin was held for colonoscopy. She has not had any chest pain, SOB, palpitations, nausea, vomiting, dizziness, presyncope, or syncope. No orthopnea, PND, abdominal swelling, early satiety, or lower extremity swelling. She has noted for about the past 2-3 weeks a small painful area on the medial aspect of her left lower leg. Painful area is about as large a a thumbprint. No injury or trauma to that area. No erythema, swelling, warmth, or wound. This area was noted prior to stopping her Coumadin. She feels like it may be from a strain. She is up to date on her eye exam. Recent thyroid function drawn from 04/2017 showed a mildly elevated TSH with normal thyroid hormones. LFT from 04/2017 unremarkable. Potassium was noted to be elevated in 04/2017 at 5.7, no follow up.    Past Medical History:  Diagnosis Date  . Acne rosacea   . Anxiety   . Asthma   . Atrial fibrillation and flutter ([place]) 09/2014   Revereted from Afib RVR to 2:1 A  Flutter -- TEE/ DCCV 2/9; on Amiodarone  . 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 Mod LA dilation, normal PA pressures   . CVA (cerebral infarction)    h/o superior cerebellar infarct  . Depression   . 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 Diverticulosis   . H/O: rheumatic fever    Childhood  . HYPERLIPIDEMIA 04/05/2010   Qualifier: Diagnosis of  By: Lorelei Pont MD, Frederico Hamman    . Hypothyroid    On replacement therapy; normal TSH 09/2014  . Migraine headache   . Osteopenia   . Paroxysmal atrial fibrillation ([place]) 2005; Recurrent 09/2014   a) 2005: s/p Cox Maze & LAA ligation; b) 09/2014: TEE-cardioversion  . Rheumatic mitral and aortic valve insufficiency 2005   a) s/p MV Ring repair; with Cox-Maze for Afib; b) Echo 2013: EF 60-65%,no Regional WMA, Gr 2 DD, no Sig MR ;; c) TEE 10/07/2014: Severlely thickened and calcified MV leaflets. Posterior MV leaflet is fixed and immobile. MAC. reduced excursion of the anterior MV leaflet. Mean MV gradient was 49m Hg and MVA calculated at cm2.    . Urinary incontinence     Past Surgical History:  Procedure Laterality Date  . CARDIAC CATHETERIZATION  Jan 2005   Pre-op R&LHC -- Nonobstructive CAD; normal PA pressures  . CARDIOVERSION  06/04/2012   Procedure: CARDIOVERSION;  Surgeon: JCarlena Bjornstad MD;  [facility];  Service: Cardiovascular;  Laterality: N/A;  . CARDIOVERSION N/A 10/07/2014   Procedure: CARDIOVERSION;  Surgeon: TSueanne Margarita MD;  [facility]  Service: Cardiovascular;  Laterality: N/A;  . COLONOSCOPY WITH PROPOFOL N/A 07/11/2017   Procedure: COLONOSCOPY WITH PROPOFOL;  Surgeon: [place], TBenay Pike MD;  Location: [facility] ENDOSCOPY;  Service: Gastroenterology;  Laterality: N/A;  . LUMBAR DISC SURGERY     x 2 distantly  . MITRAL VALVE REPAIR  2005   with [facility] Maze for [employer] [place] [employer]   4/7/'16   EF 37% with septal hypokinesis and diffuse hypokinesis. No ischemia or infarction. 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  .Marland KitchenRIGHT HEART CATHETERIZATION N/A 10/03/2014   Procedure: RIGHT HEART CATH;  Surgeon: DJolaine Artist MD;  [facility];  Service: Cardiovascular;  RAP 528mg, RVP 35/2/9 mmHg, PAP 45/12 mmHg, PCWP 16 mmHg; CO/I by Fick: 3.9/2.3; Ao/PA/SVC SaO2%: 97%/63%/65%.  . TEE WITHOUT CARDIOVERSION N/A 10/07/2014   Procedure: TRANSESOPHAGEAL ECHOCARDIOGRAM (TEE);  Surgeon: TrSueanne MargaritaMD;  [facility];  Service: Cardiovascular;  Laterality: N/A;  . THYROIDECTOMY, PARTIAL     partial-no cancer; now on thyroid replacement  . TRANSTHORACIC ECHOCARDIOGRAM  2013; 2/'16 & 5/'16   a) 2013: EF 60-65%, mild MR, Gr 2 DD; b) 2/'16 @ [facility]: EF ~10% - severel global LV dysfunction (systolic & diastolic) - dilated LV & restrictive filling pattern - Gr 3 DD, mod reduced  RV function, severely dilated LA - 6.4 cm, mod dilated RA, mod pl effusion, mod MR, mild TR; c) 5/10/'16:  EF 25-30%, mod LV dilation, no RWMA,Gr 3 DD w/ high LAP, MV sewing ring intact w/ Mod MR, Severe LA dilation  . TRANSTHORACIC ECHOCARDIOGRAM  January 2017:   EF improved to 45-50%. Mild diffuse HK. Mild MR. Moderate LA dilation. Normal PA pressures.    Current Meds  Medication Sig  . acetaminophen (TYLENOL) 325 MG tablet Take 2 tablets (650 mg total) by mouth every 4 (four) hours as needed for mild pain, fever or headache.  Marland Kitchen amiodarone (PACERONE) 200 MG tablet Take 1 tablet (200 mg total) by mouth daily.  . Ascorbic Acid (VITAMIN C PO) Take by mouth.  Marland Kitchen b complex vitamins tablet Take 1 tablet daily by mouth.  . benzonatate (TESSALON) 200 MG capsule Take 200 mg 3 (three) times daily as needed by mouth for cough.  . Calcium Citrate-Vitamin D (CALCIUM + D PO) Take 1 capsule daily by mouth.   . carvedilol (COREG) 3.125 MG tablet TAKE ONE  TABLET BY MOUTH TWICE DAILY  . CRANBERRY PO Take by mouth.  . donepezil (ARICEPT) 5 MG tablet TAKE ONE TABLET BY MOUTH AT BEDTIME  . IRON, FERROUS SULFATE, PO Take 1 tablet by mouth daily.  Marland Kitchen levothyroxine (SYNTHROID, LEVOTHROID) 25 MCG tablet TAKE TWO AND ONE-HALF TABLETS BY MOUTH DAILY BEFORE BREAKFAST  . losartan (COZAAR) 25 MG tablet TAKE ONE TABLET BY MOUTH EVERY DAY  . MAGNESIUM PO Take by mouth.  . Multiple Vitamins-Minerals (PRESERVISION AREDS 2 PO) Take by mouth.  . Omega-3 Fatty Acids (FISH OIL PO) Take 1 capsule by mouth daily.  . polyethylene glycol powder (GLYCOLAX/MIRALAX) powder   . potassium chloride SA (K-DUR,KLOR-CON) 20 MEQ tablet Take 20 mEq by mouth daily.   Marland Kitchen warfarin (COUMADIN) 5 MG tablet Take 1 to 1 and 1/2 tablets every day as directed by coumadin clinic    Allergies:   Patient has no known allergies.   Social History:  The patient  reports that  has never smoked. she has never used smokeless tobacco. She reports that she does not drink alcohol or use drugs.   Family History:  The patient's family history includes Coronary artery disease in her mother; Diabetes in her mother; Kidney disease in her mother.  ROS:   Review of Systems  Constitutional: Negative for chills, diaphoresis, fever, malaise/fatigue and weight loss.  HENT: Negative for congestion.   Eyes: Negative for discharge and redness.  Respiratory: Negative for cough, hemoptysis, sputum production, shortness of breath and wheezing.   Cardiovascular: Negative for chest pain, palpitations, orthopnea, claudication, leg swelling and PND.  Gastrointestinal: Negative for abdominal pain, blood in stool, heartburn, melena, nausea and vomiting.  Genitourinary: Negative for hematuria.  Musculoskeletal: Positive for myalgias. Negative for falls.       Left lower leg  Skin: Negative for rash.  Neurological: Negative for dizziness, tingling, tremors, sensory change, speech change, focal weakness, loss of  consciousness and weakness.  Endo/Heme/Allergies: Does not bruise/bleed easily.  Psychiatric/Behavioral: Negative for substance abuse. The patient is not nervous/anxious.   All other systems reviewed and are negative.    PHYSICAL EXAM:  VS:  BP 116/72 (BP Location: Left Arm, Patient Position: Sitting, Cuff Size: Normal)   Pulse 63   Ht _0  (1.753 m)   Wt 161 lb 8 oz (73.3 kg)   BMI 23.85 kg/m  BMI: Body mass index is 23.85 kg/m.  Physical Exam  Constitutional: She is oriented to person, place, and time. She appears well-developed and well-nourished.  HENT:  Head: Normocephalic and atraumatic.  Eyes: Right eye exhibits no discharge. Left eye exhibits no discharge.  Neck: Normal range of motion. No JVD present.  Cardiovascular: Normal rate, regular rhythm, S1 normal, S2 normal and normal heart sounds. Exam reveals no distant heart sounds, no friction rub, no midsystolic click and no opening snap.  No murmur heard. Pulses:      Dorsalis pedis pulses are 2+ on the left side.       Posterior tibial pulses are 2+ on the right side, and 2+ on the left side.  Pulmonary/Chest: Effort normal and breath sounds normal. No respiratory distress. She has no decreased breath sounds. She has no wheezes. She has no rales. She exhibits no tenderness.  Abdominal: Soft. She exhibits no distension. There is no tenderness.  Musculoskeletal: She exhibits no edema.  Medial aspect of left lower calf muscle with pinpoint TTP. No erythema, warmth, wound, bruising, swelling, or cording noted. Distal pedal pulses 2+.   Neurological: She is alert and oriented to person, place, and time.  Skin: Skin is warm and dry. No cyanosis. Nails show no clubbing.  Psychiatric: She has a normal mood and affect. Her speech is normal and behavior is normal. Judgment and thought content normal.     EKG:  Was ordered and interpreted by me today. Shows NSR, 63 bpm, 1st degree AV block, QTc 433 msec, nonspecific st/t changes  (unchanged from prior studies)  Recent Labs: 05/16/2017: ALT 23; BUN 23; Creatinine, Ser 0.85; Hemoglobin 13.7; Platelets 189.0; Potassium 5.7; Sodium 138; TSH 6.21  05/16/2017: Cholesterol 207; HDL 77.40; LDL Cholesterol 107; Total CHOL/HDL Ratio 3; Triglycerides 116.0; VLDL 23.2   CrCl cannot be calculated (Patient's most recent lab result is older than the maximum 21 days allowed.).   Wt Readings from Last 3 Encounters:  07/13/17 161 lb 8 oz (73.3 kg)  07/11/17 162 lb (73.5 kg)  06/19/17 162 lb (73.5 kg)     Other studies reviewed: Additional studies/records reviewed today include: summarized above  ASSESSMENT AND PLAN:  1. PAF: Currently in sinus rhythm with heart rate in the 60s bpm. Asymptomatic. Continue Coreg for rate control and amiodarone 200 mg daily for rhythm control. Recent LFTs and TSH within the past 2 months were unremarkable, no indication to repeat at this time. CHADS2VASc at least 6 (CHF, age x 1, stroke x 2, vascular disease, female). Continue Coumadin for long term, full-dose anticoagulation per Coumadin Clinic. INR was expected to be subtherapeutic today given this was held for her recent colonoscopy. Per Coumadin Clinic.   2. NICM: She does not appear volume up at this time. Continue Coreg and losartan. Check echo as below.   3. Valvular heart disease: Asymptomatic. Echo 08/2016 showed mild MR and trivial AI. Schedule echo to evaluate for any dynamic changes in her valvular heart disease.   4. High risk medication: Coumadin followed by Coumadin Clinic. INR 1.3 today (expected subtherapeutic). 5. Hyperkalemia: Potassium noted to be 5.7 in 04/2017. No EKG changes concerning for hyperkalemia at this time. Asymptomatic. Check bmet today.   6. COPD: Followed by pulmonary. Recent PFTs noted. Continued on amiodarone for now. Follow up with pulmonology as advised.   Disposition: F/u with Dr. Saunders Revel, MD in 12 months.   Current medicines are reviewed at length with the patient  today.  The patient did not have any concerns regarding medicines.  Signed, Christell Faith PA-C  07/13/2017 10:02 AM     [address], [place] [address] [phone]

## 2017-07-13 NOTE — Patient Instructions (Addendum)
Medication Instructions:  Please continue your current medications  Labwork: BMET  Testing/Procedures: Your physician has requested that you have an echocardiogram. Echocardiography is a painless test that uses sound waves to create images of your heart. It provides your doctor with information about the size and shape of your heart and how well your heart's chambers and valves are working. This procedure takes approximately one hour. There are no restrictions for this procedure.  Follow-Up: Your physician wants you to follow-up in: 1 year with Dr. Saunders [name]  You will receive a reminder letter in the mail two months in advance.  If you don't receive a letter, please call our office to schedule the follow-up appointment.  If you need a refill on your cardiac medications before your next appointment, please call your pharmacy.  Echocardiogram An echocardiogram, or echocardiography, uses sound waves (ultrasound) to produce an image of your heart. The echocardiogram is simple, painless, obtained within a short period of time, and offers valuable information to your health care provider. The images from an echocardiogram can provide information such as:  Evidence of coronary artery disease (CAD).  Heart size.  Heart muscle function.  Heart valve function.  Aneurysm detection.  Evidence of a past heart attack.  Fluid buildup around the heart.  Heart muscle thickening.  Assess heart valve function.  Tell a health care provider about:  Any allergies you have.  All medicines you are taking, including vitamins, herbs, eye drops, creams, and over-the-counter medicines.  Any problems you or family members have had with anesthetic medicines.  Any blood disorders you have.  Any surgeries you have had.  Any medical conditions you have.  Whether you are pregnant or may be pregnant. What happens before the procedure? No special preparation is needed. Eat and drink normally. What  happens during the procedure?  In order to produce an image of your heart, gel will be applied to your chest and a wand-like tool (transducer) will be moved over your chest. The gel will help transmit the sound waves from the transducer. The sound waves will harmlessly bounce off your heart to allow the heart images to be captured in real-time motion. These images will then be recorded.  You may need an IV to receive a medicine that improves the quality of the pictures. What happens after the procedure? You may return to your normal schedule including diet, activities, and medicines, unless your health care provider tells you otherwise. This information is not intended to replace advice given to you by your health care provider. Make sure you discuss any questions you have with your health care provider. Document Released: 08/12/2000 Document Revised: 04/02/2016 Document Reviewed: 04/22/2013 Elsevier Interactive Patient Education  2017 [employer].

## 2017-07-14 LAB — BASIC METABOLIC PANEL
BUN / CREAT RATIO: 12 (ref 12–28)
BUN: 13 mg/dL (ref 8–27)
CO2: 23 mmol/L (ref 20–29)
CREATININE: 1.11 mg/dL — AB (ref 0.57–1.00)
Calcium: 8.8 mg/dL (ref 8.7–10.3)
Chloride: 106 mmol/L (ref 96–106)
GFR calc Af Amer: 57 mL/min/{1.73_m2} — ABNORMAL LOW (ref >59)
GFR, EST NON AFRICAN AMERICAN: 50 mL/min/{1.73_m2} — AB (ref >59)
Glucose: 85 mg/dL (ref 65–99)
Potassium: 4.3 mmol/L (ref 3.5–5.2)
SODIUM: 142 mmol/L (ref 134–144)

## 2017-07-17 ENCOUNTER — Ambulatory Visit (INDEPENDENT_AMBULATORY_CARE_PROVIDER_SITE_OTHER): Payer: Medicare HMO | Admitting: Urology

## 2017-07-17 ENCOUNTER — Encounter: Payer: Self-pay | Admitting: Urology

## 2017-07-17 ENCOUNTER — Other Ambulatory Visit: Payer: Self-pay | Admitting: Physician Assistant

## 2017-07-17 VITALS — BP 95/57 | HR 58 | Ht 69.0 in | Wt 159.0 lb

## 2017-07-17 DIAGNOSIS — I428 Other cardiomyopathies: Secondary | ICD-10-CM

## 2017-07-17 DIAGNOSIS — I480 Paroxysmal atrial fibrillation: Secondary | ICD-10-CM

## 2017-07-17 DIAGNOSIS — I38 Endocarditis, valve unspecified: Secondary | ICD-10-CM

## 2017-07-17 DIAGNOSIS — R32 Unspecified urinary incontinence: Secondary | ICD-10-CM | POA: Insufficient documentation

## 2017-07-17 DIAGNOSIS — E875 Hyperkalemia: Secondary | ICD-10-CM

## 2017-07-17 LAB — MICROSCOPIC EXAMINATION
EPITHELIAL CELLS (NON RENAL): NONE SEEN /HPF (ref 0–10)
RBC MICROSCOPIC, UA: NONE SEEN /HPF (ref 0–?)

## 2017-07-17 LAB — URINALYSIS, COMPLETE
Bilirubin, UA: NEGATIVE
GLUCOSE, UA: NEGATIVE
Ketones, UA: NEGATIVE
NITRITE UA: NEGATIVE
Protein, UA: NEGATIVE
RBC, UA: NEGATIVE
Specific Gravity, UA: 1.025 (ref 1.005–1.030)
Urobilinogen, Ur: 0.2 mg/dL (ref 0.2–1.0)
pH, UA: 6.5 (ref 5.0–7.5)

## 2017-07-17 MED ORDER — CIPROFLOXACIN HCL 500 MG PO TABS
500.0000 mg | ORAL_TABLET | Freq: Once | ORAL | Status: AC
  Administered 2017-07-17: 500 mg via ORAL

## 2017-07-17 MED ORDER — LIDOCAINE HCL 2 % EX GEL
1.0000 "application " | Freq: Once | CUTANEOUS | Status: AC
  Administered 2017-07-17: 1 via URETHRAL

## 2017-07-17 NOTE — Progress Notes (Signed)
   07/17/17  CC:  Chief Complaint  Patient presents with  . Cysto    HPI: Refer to my prior note dated 06/19/2017.  Her insurance would not cover Vesicare and she has been taking Detrol LA for approximately 2 weeks with improvement in her symptoms.  Blood pressure (!) 95/57, pulse (!) 58, height 5\' 9"  (1.753 m), weight 159 lb (72.1 kg). NED. A&Ox3.   No respiratory distress   Abd soft, NT, ND Normal external genitalia with patent urethral meatus.  Atrophic vaginal mucosa.  Grade 2 cystocele.  Leakage noted with Valsalva in the lithotomy position  Cystoscopy Procedure Note  Patient identification was confirmed, informed consent was obtained, and patient was prepped using Betadine solution.  Lidocaine jelly was administered per urethral meatus.    Preoperative abx where received prior to procedure.    Procedure: - Flexible cystoscope introduced, without any difficulty.   - Thorough search of the bladder revealed:    normal urethral meatus    normal urothelium    no stones    no ulcers     no tumors    no urethral polyps    no trabeculation  - Ureteral orifices were normal in position and appearance.  Post-Procedure: - Patient tolerated the procedure well  Assessment/ Plan:   Mixed urinary incontinence.  She has noted improvement in her storage related symptoms on her Detrol LA.  She would like to continue.  Pelvic floor physical therapy was discussed which may help both types of her incontinence.  Surgical treatment of stress incontinence was also discussed.  For now she desires to continue Detrol and states she will call back if she is interested in additional treatment options.    Abbie Sons, MD

## 2017-07-18 ENCOUNTER — Telehealth: Payer: Self-pay | Admitting: Physician Assistant

## 2017-07-18 DIAGNOSIS — I480 Paroxysmal atrial fibrillation: Secondary | ICD-10-CM

## 2017-07-18 DIAGNOSIS — I5042 Chronic combined systolic (congestive) and diastolic (congestive) heart failure: Secondary | ICD-10-CM

## 2017-07-18 NOTE — Telephone Encounter (Signed)
Pt returning our call about lab work   Please call back

## 2017-07-18 NOTE — Telephone Encounter (Signed)
Called patient. She verbalized understanding of lab work results from 07/13/17 to increase water intake and repeat BMET in 1 week. She will go to the [facility] on Friday, 07/21/17, for the lab work.

## 2017-07-21 ENCOUNTER — Other Ambulatory Visit
Admission: RE | Admit: 2017-07-21 | Discharge: 2017-07-21 | Disposition: A | Discharge status: Home / Self Care | Payer: Medicare HMO | Source: Ambulatory Visit | Attending: Physician Assistant | Admitting: Physician Assistant

## 2017-07-21 DIAGNOSIS — I480 Paroxysmal atrial fibrillation: Secondary | ICD-10-CM | POA: Diagnosis not present

## 2017-07-21 DIAGNOSIS — I5042 Chronic combined systolic (congestive) and diastolic (congestive) heart failure: Secondary | ICD-10-CM | POA: Diagnosis not present

## 2017-07-21 LAB — BASIC METABOLIC PANEL
ANION GAP: 8 (ref 5–15)
BUN: 13 mg/dL (ref 6–20)
CALCIUM: 9.2 mg/dL (ref 8.9–10.3)
CO2: 27 mmol/L (ref 22–32)
Chloride: 104 mmol/L (ref 101–111)
Creatinine, Ser: 0.77 mg/dL (ref 0.44–1.00)
GFR calc Af Amer: >60 mL/min (ref >60)
GLUCOSE: 100 mg/dL — AB (ref 65–99)
Potassium: 4.8 mmol/L (ref 3.5–5.1)
Sodium: 139 mmol/L (ref 135–145)

## 2017-07-26 ENCOUNTER — Other Ambulatory Visit: Payer: Self-pay

## 2017-07-26 ENCOUNTER — Ambulatory Visit (INDEPENDENT_AMBULATORY_CARE_PROVIDER_SITE_OTHER): Payer: Medicare HMO

## 2017-07-26 DIAGNOSIS — I428 Other cardiomyopathies: Secondary | ICD-10-CM | POA: Diagnosis not present

## 2017-07-26 DIAGNOSIS — E875 Hyperkalemia: Secondary | ICD-10-CM

## 2017-07-26 DIAGNOSIS — I4892 Unspecified atrial flutter: Secondary | ICD-10-CM | POA: Diagnosis not present

## 2017-07-26 DIAGNOSIS — I4891 Unspecified atrial fibrillation: Secondary | ICD-10-CM | POA: Diagnosis not present

## 2017-07-26 DIAGNOSIS — Z5181 Encounter for therapeutic drug level monitoring: Secondary | ICD-10-CM

## 2017-07-26 DIAGNOSIS — Z7901 Long term (current) use of anticoagulants: Secondary | ICD-10-CM

## 2017-07-26 DIAGNOSIS — I38 Endocarditis, valve unspecified: Secondary | ICD-10-CM | POA: Diagnosis not present

## 2017-07-26 DIAGNOSIS — I480 Paroxysmal atrial fibrillation: Secondary | ICD-10-CM

## 2017-07-26 LAB — POCT INR: INR: 3.2

## 2017-07-26 NOTE — Patient Instructions (Signed)
Please take 1 tablet  today, then resume dosage of 1 pill daily except 1.5 pills on Wednesdays. Recheck in 3 weeks.

## 2017-07-27 ENCOUNTER — Encounter: Payer: Self-pay | Admitting: Family Medicine

## 2017-07-27 ENCOUNTER — Other Ambulatory Visit: Payer: Self-pay

## 2017-07-27 ENCOUNTER — Ambulatory Visit: Payer: Medicare HMO | Admitting: Family Medicine

## 2017-07-27 VITALS — BP 100/70 | HR 53 | Temp 98.2°F | Ht 67.5 in | Wt 163.5 lb

## 2017-07-27 DIAGNOSIS — J208 Acute bronchitis due to other specified organisms: Secondary | ICD-10-CM

## 2017-07-27 LAB — ECHOCARDIOGRAM COMPLETE
Ao-asc: 32 cm
CHL CUP DOP CALC LVOT VTI: 19.7 cm
CHL CUP LVOT MV VTI INDEX: 1.35 cm2/m2
CHL CUP MV DEC (S): 215
CHL CUP MV M VEL: 54.5
E/e' ratio: 18.64
EWDT: 215 ms
FS: 29 % (ref 28–44)
IV/PV OW: 1.1
LA vol A4C: 98.8 ml
LA vol index: 58.8 mL/m2
LA vol: 110 mL
LADIAMINDEX: 2.94 cm/m2
LASIZE: 55 mm
LEFT ATRIUM END SYS DIAM: 55 mm
LV E/e'average: 18.64
LV PW d: 10 mm — AB (ref 0.6–1.1)
LVEEMED: 18.64
LVELAT: 8.1 cm/s
LVOT MV VTI: 2.52
LVOT area: 4.15 cm2
LVOTD: 23 mm
LVOTSV: 82 mL
Lateral S' vel: 9.3 cm/s
MV Annulus VTI: 32.4 cm
MV Peak grad: 9 mmHg
MV pk E vel: 151 m/s
MVAP: 2.89 cm2
Mean grad: 2 mmHg
P 1/2 time: 63 ms
TAPSE: 18.3 mm
TDI e' lateral: 8.1
TDI e' medial: 4.5

## 2017-07-27 MED ORDER — CEFDINIR 300 MG PO CAPS: 600.0000 mg | ORAL_CAPSULE | Freq: Every day | ORAL | 0 refills | Status: DC

## 2017-07-27 NOTE — Progress Notes (Signed)
Dr. Frederico Hamman T. Jorey Dollard, MD, [facility] [place], [address] Phone: [phone] Fax: [phone]  07/27/2017  Patient: [name], MRN: [id number], DOB: [birth year], 71 y.o.  Primary Physician:  Owens Loffler, MD   Chief Complaint  Patient presents with  . Nasal Congestion  . Sore Throat  . Cough  . Headache  . Fatigue   Subjective:   [name] is a 71 y.o. very pleasant female patient who presents with the following:  Started off Monday with a drippy nose and a bad sore throat and congestion and feels like she is coughing, coughing, coughing. HA and dreadful and a headache. Yest.   She does have a history of CHF on coumadin and amiodarone  Past Medical History, Surgical History, Social History, Family History, Problem List, Medications, and Allergies have been reviewed and updated if relevant.  Patient Active Problem List   Diagnosis Date Noted  . Mild dementia 10/24/2014    Priority: High  . Chronic combined systolic and diastolic CHF, NYHA class 1 ([place]) 09/29/2014    Priority: High  . 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 ([place]) 09/29/2014    Priority: High  . 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 Generalized anxiety disorder 11/18/2008    Priority: Medium  . Major depressive disorder, recurrent episode, moderate with anxious distress ([place]) 11/18/2008    Priority: Medium  . Urinary incontinence 07/17/2017  . Urinary frequency 06/19/2017  . Nocturia 06/19/2017  . Acute low back pain without sciatica 10/12/2016  . Primary osteoarthritis of both knees 10/12/2016  . Fracture of left pelvis ([place]) 04/29/2015  . PAF (paroxysmal atrial fibrillation) ([place]) 03/04/2015  . Atrial flutter ([place]) 03/04/2015  . On amiodarone therapy 11/14/2014  . Current use of long term anticoagulation 11/14/2014  . Hyperlipidemia LDL goal <100  04/05/2010  . GERD 04/05/2010  . 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 Hypothyroid 11/18/2008  . ASTHMA 11/18/2008  . Mixed incontinence urge and stress 11/18/2008  . NEPHROLITHIASIS, HX OF 11/18/2008    Past Medical History:  Diagnosis Date  . Acne rosacea   . Anxiety   . Asthma   . Atrial fibrillation and flutter ([place]) 09/2014   Revereted from Afib RVR to 2:1 A Flutter -- TEE/ DCCV 2/9; on Amiodarone  . Chronic combined systolic and diastolic CHF, NYHA class 1 ([facility]) -- Systolic dysfunction [id number][W97.94] 09/2014   Exacerbation 09/2014 2/2 Afib/flutter with RVR - 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 Mod LA dilation, normal PA pressures   . CVA (cerebral infarction)    h/o superior cerebellar infarct  . Depression   . Digoxin toxicity 09/2014  . 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 ([place]) 09/2014   a) [facility] Echo: EF ~10% Severe LV dilation & global LV Systolic dysfxn, restrictive filling pattern - Gr 3 DD, mod RV dysfxn, severe LA dilation - 6.4 cm, mod RA dil, mod pl effusion, mod MR, mild TR; b) TEE 10/07/14: EF ~10%, Severe LV dilation & dysfxn Mod RV dysfxn, LAA oversewn, Mod RA & TR  . Diverticulosis   . H/O: rheumatic fever    Childhood  . HYPERLIPIDEMIA 04/05/2010   Qualifier: Diagnosis of  By: Lorelei Pont MD, Frederico Hamman    . Hypothyroid    On replacement therapy; normal TSH 09/2014  . Migraine headache   .  Osteopenia   . Paroxysmal atrial fibrillation ([place]) 2005; Recurrent 09/2014   a) 2005: s/p Cox Maze & LAA ligation; b) 09/2014: TEE-cardioversion  . Rheumatic mitral and aortic valve insufficiency 2005   a) s/p MV Ring repair; with Cox-Maze for Afib; b) Echo 2013: EF 60-65%,no Regional WMA, Gr 2 DD, no Sig MR ;; c) TEE 10/07/2014: Severlely thickened and calcified MV leaflets. Posterior MV leaflet is fixed and immobile. MAC. reduced excursion of the anterior MV leaflet. Mean  MV gradient was 27m Hg and MVA calculated at cm2.    . Urinary incontinence     Past Surgical History:  Procedure Laterality Date  . CARDIAC CATHETERIZATION  Jan 2005   Pre-op R&LHC -- Nonobstructive CAD; normal PA pressures  . CARDIOVERSION  06/04/2012   Procedure: CARDIOVERSION;  Surgeon: JCarlena Bjornstad MD;  [facility];  Service: Cardiovascular;  Laterality: N/A;  . CARDIOVERSION N/A 10/07/2014   Procedure: CARDIOVERSION;  Surgeon: TSueanne Margarita MD;  [place] [facility] [place]  Service: Cardiovascular;  Laterality: N/A;  . COLONOSCOPY WITH PROPOFOL N/A 07/11/2017   Procedure: COLONOSCOPY WITH PROPOFOL;  Surgeon: Toledo, TBenay Pike MD;  Location: [facility] ENDOSCOPY;  Service: Gastroenterology;  Laterality: N/A;  . LUMBAR DISC SURGERY     x 2 distantly  . MITRAL VALVE REPAIR  2005   with [employer] [facility]aze for [employer] . [place] MYOVIEW LTD  4/7/'16   EF 37% with septal hypokinesis and diffuse hypokinesis. No ischemia or infarction. 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  .Marland KitchenRIGHT HEART CATHETERIZATION N/A 10/03/2014   Procedure: RIGHT HEART CATH;  Surgeon: DJolaine Artist MD;  [facility];  Service: Cardiovascular;  RAP 548mg, RVP 35/2/9 mmHg, PAP 45/12 mmHg, PCWP 16 mmHg; CO/I by Fick: 3.9/2.3; Ao/PA/SVC SaO2%: 97%/63%/65%.  . TEE WITHOUT CARDIOVERSION N/A 10/07/2014   Procedure: TRANSESOPHAGEAL ECHOCARDIOGRAM (TEE);  Surgeon: TrSueanne MargaritaMD;  [facility];  Service: Cardiovascular;  Laterality: N/A;  . THYROIDECTOMY, PARTIAL     partial-no cancer; now on thyroid replacement  . TRANSTHORACIC ECHOCARDIOGRAM  2013; 2/'16 & 5/'16   a) 2013: EF 60-65%, mild MR, Gr 2 DD; b) [place] - severel global LV dysfunction (systolic & diastolic) - dilated LV & restrictive filling pattern - Gr 3 DD, mod reduced RV function, severely dilated LA - 6.4 cm, mod dilated RA, mod pl effusion, mod MR, mild TR; c) 5/10/'16:  EF  25-30%, mod LV dilation, no RWMA,Gr 3 DD w/ high LAP, MV sewing ring intact w/ Mod MR, Severe LA dilation  . TRANSTHORACIC ECHOCARDIOGRAM  January 2017:   EF improved to 45-50%. Mild diffuse HK. Mild MR. Moderate LA dilation. Normal PA pressures.    Social History   Socioeconomic History  . Marital status: Married    Spouse name: Not on file  . Number of children: 2  . Years of education: Not on file  . Highest education level: Not on file  Social Needs  . Financial resource strain: Not on file  . Food insecurity - worry: Not on file  . Food insecurity - inability: Not on file  . Transportation needs - medical: Not on file  . Transportation needs - non-medical: Not on file  Occupational History  . Occupation: Retired  Tobacco Use  . Smoking status: Never Smoker  . Smokeless tobacco: Never Used  Substance and Sexual Activity  . Alcohol use: No  . Drug use: No  .  Sexual activity: Yes  Other Topics Concern  . Not on file  Social History Narrative  . Not on file    Family History  Problem Relation Age of Onset  . Diabetes Mother   . Coronary artery disease Mother   . Kidney disease Mother   . Breast cancer Neg Hx     No Known Allergies  Medication list reviewed and updated in full in [place].  ROS: GEN: Acute illness details above GI: Tolerating PO intake GU: maintaining adequate hydration and urination Pulm: No SOB Interactive and getting along well at home.  Otherwise, ROS is as per the HPI.  Objective:   BP 100/70   Pulse (!) 53   Temp 98.2 F (36.8 C) (Oral)   Ht 5' 7.5" (1.715 m)   Wt 163 lb 8 oz (74.2 kg)   SpO2 97%   BMI 25.23 kg/m    Gen: WDWN, NAD; A & O x3, cooperative. Pleasant.Globally Non-toxic HEENT: Normocephalic and atraumatic. Throat clear, w/o exudate, R TM clear, L TM - good landmarks, No fluid present. rhinnorhea.  MMM Frontal sinuses: NT Max sinuses: NT NECK: Anterior cervical  LAD is absent CV: RRR, No M/G/R, cap  refill <2 sec PULM: Breathing comfortably in no respiratory distress. no wheezing, crackles, rare b rhonchi EXT: No c/c/e PSYCH: Friendly, good eye contact MSK: Nml gait     Laboratory and Imaging Data:  Assessment and Plan:   Acute bronchitis due to other specified organisms  Given the overall clinical picture and comorbidities as well as rare rhonchi, treat with Omnicef.  Follow-up: No Follow-up on file.  Future Appointments  Date Time Provider [place]  08/07/2017 10:30 AM LBRD-DG DEXA 1 LBRD-DG LB-DG  08/14/2017  8:30 AM [facility]  05/21/2018  9:45 AM Pinson, Venetia Maxon, LPN [facility] PEC  3/88/8280  8:30 AM Hilliary Jock, Frederico Hamman, MD [facility]    Meds ordered this encounter  Medications  . cefdinir (OMNICEF) 300 MG capsule    Sig: Take 2 capsules (600 mg total) by mouth daily.    Dispense:  20 capsule    Refill:  0   Medications Discontinued During This Encounter  Medication Reason  . Ascorbic Acid (VITAMIN C PO) Completed Course  . CRANBERRY PO Completed Course  . MAGNESIUM PO Completed Course  . Multiple Vitamins-Minerals (PRESERVISION AREDS 2 PO) Completed Course   No orders of the defined types were placed in this encounter.   Signed,  Maud Deed. Jennalynn Rivard, MD   Allergies as of 07/27/2017   No Known Allergies     Medication List        Accurate as of 07/27/17  3:23 PM. Always use your most recent med list.          acetaminophen 325 MG tablet Commonly known as:  TYLENOL Take 2 tablets (650 mg total) by mouth every 4 (four) hours as needed for mild pain, fever or headache.   amiodarone 200 MG tablet Commonly known as:  PACERONE Take 1 tablet (200 mg total) by mouth daily.   b complex vitamins tablet Take 1 tablet daily by mouth.   benzonatate 200 MG capsule Commonly known as:  TESSALON Take 200 mg 3 (three) times daily as needed by mouth for cough.   CALCIUM + D PO Take 1 capsule daily by mouth.     carvedilol 3.125 MG tablet Commonly known as:  COREG TAKE ONE TABLET BY MOUTH TWICE DAILY   cefdinir 300 MG capsule  Commonly known as:  OMNICEF Take 2 capsules (600 mg total) by mouth daily.   donepezil 5 MG tablet Commonly known as:  ARICEPT TAKE ONE TABLET BY MOUTH AT BEDTIME   FISH OIL PO Take 1 capsule by mouth daily.   IRON (FERROUS SULFATE) PO Take 1 tablet by mouth daily.   levothyroxine 25 MCG tablet Commonly known as:  SYNTHROID, LEVOTHROID TAKE TWO AND ONE-HALF TABLETS BY MOUTH DAILY BEFORE BREAKFAST   losartan 25 MG tablet Commonly known as:  COZAAR TAKE ONE TABLET BY MOUTH EVERY DAY   polyethylene glycol powder powder Commonly known as:  GLYCOLAX/MIRALAX   potassium chloride SA 20 MEQ tablet Commonly known as:  K-DUR,KLOR-CON Take 20 mEq by mouth daily.   tolterodine 4 MG 24 hr capsule Commonly known as:  DETROL LA Take 4 mg by mouth daily.   warfarin 5 MG tablet Commonly known as:  COUMADIN Take 1 to 1 and 1/2 tablets every day as directed by coumadin clinic

## 2017-08-03 ENCOUNTER — Encounter: Payer: Self-pay | Admitting: Family Medicine

## 2017-08-03 ENCOUNTER — Ambulatory Visit: Payer: Medicare HMO | Admitting: Family Medicine

## 2017-08-03 ENCOUNTER — Other Ambulatory Visit: Payer: Self-pay

## 2017-08-03 VITALS — BP 104/60 | HR 57 | Temp 97.8°F | Ht 67.5 in | Wt 165.0 lb

## 2017-08-03 DIAGNOSIS — Z7901 Long term (current) use of anticoagulants: Secondary | ICD-10-CM | POA: Diagnosis not present

## 2017-08-03 DIAGNOSIS — J208 Acute bronchitis due to other specified organisms: Secondary | ICD-10-CM | POA: Diagnosis not present

## 2017-08-03 DIAGNOSIS — Z79899 Other long term (current) drug therapy: Secondary | ICD-10-CM

## 2017-08-03 MED ORDER — DOXYCYCLINE HYCLATE 100 MG PO TABS: 100.0000 mg | ORAL_TABLET | Freq: Two times a day (BID) | ORAL | 0 refills | Status: AC

## 2017-08-03 MED ORDER — BENZONATATE 200 MG PO CAPS: 200.0000 mg | ORAL_CAPSULE | Freq: Three times a day (TID) | ORAL | 0 refills | Status: DC | PRN

## 2017-08-03 NOTE — Progress Notes (Signed)
Dr. Frederico Hamman T. Shabrea Weldin, MD, [facility] Medicine [place], [address] Phone: [phone] Fax: [phone]  08/03/2017  Patient: [name], MRN: [id number], DOB: [birth year], 71 y.o.  Primary Physician:  Owens Loffler, MD   Chief Complaint  Patient presents with  . Follow-up    Bronchitis-still doesn't feel well   Subjective:   [name] is a 71 y.o. very pleasant female patient who presents with the following:  Started on a course of omnicef last week, she is here today for a recheck. Sore throat has gone away. Coughy and runny nose - she is taking multiple cough and cold medicines.  The patient is here, and she actually is doing worse compared to when she was here 1 week prior.  She has been on oral Omnicef, and also she has been taking some over-the-counter cough medications including Robitussin DM, as well as NyQuil, and these did not help at all.  Complicated secondary to concomitant use of both Coumadin and amiodarone.  She has a very productive cough now.  Message to [facility] about coumadin  Past Medical History, Surgical History, Social History, Family History, Problem List, Medications, and Allergies have been reviewed and updated if relevant.  Patient Active Problem List   Diagnosis Date Noted  . Mild dementia 10/24/2014    Priority: High  . Chronic combined systolic and diastolic CHF, NYHA class 1 ([place]) 09/29/2014    Priority: High  . 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 ([place]) 09/29/2014    Priority: High  . 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 Generalized anxiety disorder 11/18/2008    Priority: Medium  . Major depressive disorder, recurrent episode, moderate with anxious distress ([place]) 11/18/2008    Priority: Medium  . Primary osteoarthritis of both knees 10/12/2016  . Fracture of left pelvis ([place]) 04/29/2015  . PAF (paroxysmal  atrial fibrillation) ([place]) 03/04/2015  . On amiodarone therapy 11/14/2014  . Current use of long term anticoagulation 11/14/2014  . Hyperlipidemia LDL goal <100 04/05/2010  . GERD 04/05/2010  . Hypothyroid 11/18/2008  . ASTHMA 11/18/2008  . Mixed incontinence urge and stress 11/18/2008  . NEPHROLITHIASIS, HX OF 11/18/2008    Past Medical History:  Diagnosis Date  . Acne rosacea   . Anxiety   . Asthma   . Atrial fibrillation and flutter ([place]) 09/2014   Revereted from Afib RVR to 2:1 A Flutter -- TEE/ DCCV 2/9; on Amiodarone  . Chronic combined systolic and diastolic CHF, NYHA class 1 ([facility]) -- Systolic dysfunction [id number][T55.73] 09/2014   Exacerbation 09/2014 2/2 Afib/flutter with RVR - 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 Mod LA dilation, normal PA pressures   . CVA (cerebral infarction)    h/o superior cerebellar infarct  . Depression   . Digoxin toxicity 09/2014  . 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 ([place]) 09/2014   a) [facility] Echo: EF ~10% Severe LV dilation & global LV Systolic dysfxn, restrictive filling pattern - Gr 3 DD, mod RV dysfxn, severe LA dilation - 6.4 cm, mod RA dil, mod pl effusion, mod MR, mild TR; b) TEE 10/07/14: EF ~10%, Severe LV dilation & dysfxn Mod RV dysfxn, LAA oversewn, Mod RA & TR  . Diverticulosis   . H/O: rheumatic fever    Childhood  . HYPERLIPIDEMIA 04/05/2010   Qualifier: Diagnosis of  By: Lorelei Pont MD, Frederico Hamman    . Hypothyroid    On replacement therapy; normal TSH 09/2014  . Migraine headache   . Osteopenia   . Paroxysmal atrial fibrillation ([place]) 2005; Recurrent 09/2014   a) 2005: s/p Cox Maze & LAA ligation; b) 09/2014: TEE-cardioversion  . Rheumatic mitral and aortic valve insufficiency 2005   a) s/p MV Ring repair; with Cox-Maze for Afib; b) Echo 2013: EF 60-65%,no Regional WMA, Gr 2 DD, no Sig MR ;; c) TEE 10/07/2014: Severlely thickened and calcified MV leaflets.  Posterior MV leaflet is fixed and immobile. MAC. reduced excursion of the anterior MV leaflet. Mean MV gradient was 49m Hg and MVA calculated at cm2.    . Urinary incontinence     Past Surgical History:  Procedure Laterality Date  . CARDIAC CATHETERIZATION  Jan 2005   Pre-op R&LHC -- Nonobstructive CAD; normal PA pressures  . CARDIOVERSION  06/04/2012   Procedure: CARDIOVERSION;  Surgeon: JCarlena Bjornstad MD;  [facility];  Service: Cardiovascular;  Laterality: N/A;  . CARDIOVERSION N/A 10/07/2014   Procedure: CARDIOVERSION;  Surgeon: TSueanne Margarita MD;  [facility] [facility]  Service: Cardiovascular;  Laterality: N/A;  . COLONOSCOPY WITH PROPOFOL N/A 07/11/2017   Procedure: COLONOSCOPY WITH PROPOFOL;  Surgeon: Toledo, TBenay Pike MD;  Location: [facility] ENDOSCOPY;  Service: Gastroenterology;  Laterality: N/A;  . LUMBAR DISC SURGERY     x 2 distantly  . MITRAL VALVE REPAIR  2005   with [facility] Maze for [employer] . [place] MYOVIEW LTD  4/7/'16   EF 37% with septal hypokinesis and diffuse hypokinesis. No ischemia or infarction. 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  .Marland KitchenRIGHT HEART CATHETERIZATION N/A 10/03/2014   Procedure: RIGHT HEART CATH;  Surgeon: DJolaine Artist MD;  [facility];  Service: Cardiovascular;  RAP 544mg, RVP 35/2/9 mmHg, PAP 45/12 mmHg, PCWP 16 mmHg; CO/I by Fick: 3.9/2.3; Ao/PA/SVC SaO2%: 97%/63%/65%.  . TEE WITHOUT CARDIOVERSION N/A 10/07/2014   Procedure: TRANSESOPHAGEAL ECHOCARDIOGRAM (TEE);  Surgeon: TrSueanne MargaritaMD;  [facility];  Service: Cardiovascular;  Laterality: N/A;  . THYROIDECTOMY, PARTIAL     partial-no cancer; now on thyroid replacement  . TRANSTHORACIC ECHOCARDIOGRAM  2013; 2/'16 & 5/'16   a) 2013: EF 60-65%, mild MR, Gr 2 DD; b[place] [facility] [place] - severel global LV dysfunction (systolic & diastolic) - dilated LV & restrictive filling pattern - Gr 3 DD, mod reduced RV  function, severely dilated LA - 6.4 cm, mod dilated RA, mod pl effusion, mod MR, mild TR; c) 5/10/'16:  EF 25-30%, mod LV dilation, no RWMA,Gr 3 DD w/ high LAP, MV sewing ring intact w/ Mod MR, Severe LA dilation  . TRANSTHORACIC ECHOCARDIOGRAM  January 2017:   EF improved to 45-50%. Mild diffuse HK. Mild MR. Moderate LA dilation. Normal PA pressures.    Social History   Socioeconomic History  . Marital status: Married    Spouse name: Not on file  . Number of children: 2  . Years of education: Not on file  . Highest education level: Not on file  Social Needs  . Financial resource strain: Not on file  . Food insecurity - worry: Not on file  . Food insecurity - inability: Not on file  . Transportation needs - medical: Not on file  . Transportation needs - non-medical: Not on file  Occupational History  . Occupation: Retired  Tobacco Use  . Smoking  status: Never Smoker  . Smokeless tobacco: Never Used  Substance and Sexual Activity  . Alcohol use: No  . Drug use: No  . Sexual activity: Yes  Other Topics Concern  . Not on file  Social History Narrative  . Not on file    Family History  Problem Relation Age of Onset  . Diabetes Mother   . Coronary artery disease Mother   . Kidney disease Mother   . Breast cancer Neg Hx     No Known Allergies  Medication list reviewed and updated in full in [place].  ROS: GEN: Acute illness details above GI: Tolerating PO intake GU: maintaining adequate hydration and urination Pulm: No SOB Interactive and getting along well at home.  Otherwise, ROS is as per the HPI.  Objective:   BP 104/60   Pulse (!) 57   Temp 97.8 F (36.6 C) (Oral)   Ht 5' 7.5" (1.715 m)   Wt 165 lb (74.8 kg)   SpO2 98%   BMI 25.46 kg/m    GEN: A and O x 3. WDWN. NAD.    ENT: Nose clear, ext NML.  No LAD.  No JVD.  TM's clear. Oropharynx clear.  PULM: Normal WOB, no distress. No crackles, wheezes, diffuse bilateral rhonchi CV: RRR, no  M/G/R, No rubs, No JVD.   EXT: warm and well-perfused, No c/c/e. PSYCH: Pleasant and conversant.    Laboratory and Imaging Data:  Assessment and Plan:   Acute bronchitis due to other specified organisms  Long term current use of amiodarone  Anticoagulated  Patient seems to have worsened from a pulmonary standpoint.  At this point, I think that atypical infections need to be covered.  This is more challenging in the setting of amiodarone as well as Coumadin.  I chose doxycycline.  Explained to the patient that this will likely increase her INR.  I have sent a message to cardiology anticoagulation department to help assist with closer follow-up.   Follow-up: No Follow-up on file.  Future Appointments  Date Time Provider [place]  08/11/2017 11:00 AM LBRD-DG DEXA 1 LBRD-DG LB-DG  08/14/2017  8:30 AM [facility]  05/21/2018  9:45 AM Pinson, Venetia Maxon, LPN [facility]EC  6/38/7564  8:30 AM Edita Weyenberg, Frederico Hamman, MD [facility]    Meds ordered this encounter  Medications  . benzonatate (TESSALON) 200 MG capsule    Sig: Take 1 capsule (200 mg total) by mouth 3 (three) times daily as needed for cough.    Dispense:  30 capsule    Refill:  0  . doxycycline (VIBRA-TABS) 100 MG tablet    Sig: Take 1 tablet (100 mg total) by mouth 2 (two) times daily for 10 days.    Dispense:  20 tablet    Refill:  0   Medications Discontinued During This Encounter  Medication Reason  . cefdinir (OMNICEF) 300 MG capsule    Signed,  Onika Gudiel T. Yonah Tangeman, MD   Allergies as of 08/03/2017   No Known Allergies     Medication List        Accurate as of 08/03/17  1:26 PM. Always use your most recent med list.          acetaminophen 325 MG tablet Commonly known as:  TYLENOL Take 2 tablets (650 mg total) by mouth every 4 (four) hours as needed for mild pain, fever or headache.   amiodarone 200 MG tablet Commonly known as:  PACERONE Take 1 tablet (200 mg  total) by mouth daily.   b complex vitamins tablet Take 1 tablet daily by mouth.   benzonatate 200 MG capsule Commonly known as:  TESSALON Take 200 mg 3 (three) times daily as needed by mouth for cough.   benzonatate 200 MG capsule Commonly known as:  TESSALON Take 1 capsule (200 mg total) by mouth 3 (three) times daily as needed for cough.   CALCIUM + D PO Take 1 capsule daily by mouth.   carvedilol 3.125 MG tablet Commonly known as:  COREG TAKE ONE TABLET BY MOUTH TWICE DAILY   donepezil 5 MG tablet Commonly known as:  ARICEPT TAKE ONE TABLET BY MOUTH AT BEDTIME   doxycycline 100 MG tablet Commonly known as:  VIBRA-TABS Take 1 tablet (100 mg total) by mouth 2 (two) times daily for 10 days.   FISH OIL PO Take 1 capsule by mouth daily.   IRON (FERROUS SULFATE) PO Take 1 tablet by mouth daily.   levothyroxine 25 MCG tablet Commonly known as:  SYNTHROID, LEVOTHROID TAKE TWO AND ONE-HALF TABLETS BY MOUTH DAILY BEFORE BREAKFAST   losartan 25 MG tablet Commonly known as:  COZAAR TAKE ONE TABLET BY MOUTH EVERY DAY   polyethylene glycol powder powder Commonly known as:  GLYCOLAX/MIRALAX   potassium chloride SA 20 MEQ tablet Commonly known as:  K-DUR,KLOR-CON Take 20 mEq by mouth daily.   tolterodine 4 MG 24 hr capsule Commonly known as:  DETROL LA Take 4 mg by mouth daily.   warfarin 5 MG tablet Commonly known as:  COUMADIN Take 1 to 1 and 1/2 tablets every day as directed by coumadin clinic

## 2017-08-07 ENCOUNTER — Inpatient Hospital Stay: Admission: RE | Admit: 2017-08-07 | Payer: Medicare HMO | Source: Ambulatory Visit

## 2017-08-09 ENCOUNTER — Ambulatory Visit (INDEPENDENT_AMBULATORY_CARE_PROVIDER_SITE_OTHER): Payer: Medicare HMO

## 2017-08-09 ENCOUNTER — Telehealth: Payer: Self-pay

## 2017-08-09 DIAGNOSIS — I4891 Unspecified atrial fibrillation: Secondary | ICD-10-CM | POA: Diagnosis not present

## 2017-08-09 DIAGNOSIS — Z5181 Encounter for therapeutic drug level monitoring: Secondary | ICD-10-CM

## 2017-08-09 DIAGNOSIS — Z7901 Long term (current) use of anticoagulants: Secondary | ICD-10-CM | POA: Diagnosis not present

## 2017-08-09 LAB — POCT INR: INR: 4

## 2017-08-09 NOTE — Patient Instructions (Signed)
Description   Skip today's dose, and while on Doxycycline take 1 tablet daily except 1/2 tablet on Mondays and Fridays. Recheck in one week.

## 2017-08-09 NOTE — Telephone Encounter (Signed)
Pt here now for INR check. Please see anticoag visit.

## 2017-08-09 NOTE — Telephone Encounter (Signed)
-----   Message from Owens Loffler, MD sent at 08/03/2017  1:22 PM EST ----- Regarding: coumadin + doxy [name],   I had to place the above patient on doxycycline today, so can you touch base with her about her coumadin / earlier appt please?  Thanks.   Spencer [name]

## 2017-08-09 NOTE — Telephone Encounter (Signed)
Left message for pt to call back  °

## 2017-08-11 ENCOUNTER — Ambulatory Visit (INDEPENDENT_AMBULATORY_CARE_PROVIDER_SITE_OTHER)
Admission: RE | Admit: 2017-08-11 | Discharge: 2017-08-11 | Disposition: A | Discharge status: Home / Self Care | Payer: Medicare HMO | Source: Ambulatory Visit | Attending: Family Medicine | Admitting: Family Medicine

## 2017-08-11 DIAGNOSIS — M8580 Other specified disorders of bone density and structure, unspecified site: Secondary | ICD-10-CM

## 2017-08-11 DIAGNOSIS — M85851 Other specified disorders of bone density and structure, right thigh: Secondary | ICD-10-CM

## 2017-08-16 ENCOUNTER — Ambulatory Visit (INDEPENDENT_AMBULATORY_CARE_PROVIDER_SITE_OTHER): Payer: Medicare HMO

## 2017-08-16 DIAGNOSIS — I4891 Unspecified atrial fibrillation: Secondary | ICD-10-CM

## 2017-08-16 DIAGNOSIS — Z5181 Encounter for therapeutic drug level monitoring: Secondary | ICD-10-CM | POA: Diagnosis not present

## 2017-08-16 DIAGNOSIS — Z7901 Long term (current) use of anticoagulants: Secondary | ICD-10-CM | POA: Diagnosis not present

## 2017-08-16 LAB — POCT INR: INR: 2.6

## 2017-08-16 NOTE — Patient Instructions (Signed)
Please continue dosage of 1 tablet daily except 1/2 tablet on Mondays and Fridays. Recheck in 3 weeks.

## 2017-09-01 ENCOUNTER — Ambulatory Visit: Payer: Medicare HMO | Admitting: Podiatry

## 2017-09-01 ENCOUNTER — Encounter: Payer: Self-pay | Admitting: Podiatry

## 2017-09-01 DIAGNOSIS — L600 Ingrowing nail: Secondary | ICD-10-CM | POA: Diagnosis not present

## 2017-09-01 NOTE — Progress Notes (Signed)
   Subjective:    Patient ID: [name], female    DOB: [birth year], 72 y.o.   MRN: [id number]  HPI    Review of Systems  Musculoskeletal: Positive for arthralgias.  Skin:       Change in nails  Hematological: Bruises/bleeds easily.  All other systems reviewed and are negative.      Objective:   Physical Exam        Assessment & Plan:

## 2017-09-03 NOTE — Progress Notes (Signed)
Subjective: Patient presents today for evaluation of pain to the lateral border of the right great toe into the medial border of the left great toe that began about 1 week ago. Patient is concerned for possible ingrown nail.  She reports associated redness and swelling of the areas.  Applying pressure increases the pain.  There are no alleviating factors noted. She has been soaking the feet in Epsom salt and applying OTC pain spray with no significant relief.  Patient presents today for further treatment and evaluation.   Past Medical History:  Diagnosis Date  . Acne rosacea   . Anxiety   . Asthma   . Atrial fibrillation and flutter ([place]) 09/2014   Revereted from Afib RVR to 2:1 A Flutter -- TEE/ DCCV 2/9; on Amiodarone  . 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 Mod LA dilation, normal PA pressures   . CVA (cerebral infarction)    h/o superior cerebellar infarct  . Depression   . Digoxin toxicity 09/2014  . 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 ([place]) 09/2014   a) [facility] Echo: EF ~10% Severe LV dilation & global LV Systolic dysfxn, restrictive filling pattern - Gr 3 DD, mod RV dysfxn, severe LA dilation - 6.4 cm, mod RA dil, mod pl effusion, mod MR, mild TR; b) TEE 10/07/14: EF ~10%, Severe LV dilation & dysfxn Mod RV dysfxn, LAA oversewn, Mod RA & TR  . Diverticulosis   . H/O: rheumatic fever    Childhood  . HYPERLIPIDEMIA 04/05/2010   Qualifier: Diagnosis of  By: Lorelei Pont MD, Frederico Hamman    . Hypothyroid    On replacement therapy; normal TSH 09/2014  . Migraine headache   . Osteopenia   . Paroxysmal atrial fibrillation ([place]) 2005; Recurrent 09/2014   a) 2005: s/p Cox Maze & LAA ligation; b) 09/2014: TEE-cardioversion  . Rheumatic mitral and aortic valve  insufficiency 2005   a) s/p MV Ring repair; with Cox-Maze for Afib; b) Echo 2013: EF 60-65%,no Regional WMA, Gr 2 DD, no Sig MR ;; c) TEE 10/07/2014: Severlely thickened and calcified MV leaflets. Posterior MV leaflet is fixed and immobile. MAC. reduced excursion of the anterior MV leaflet. Mean MV gradient was 66m Hg and MVA calculated at cm2.    . Urinary incontinence     Objective:  General: Well developed, nourished, in no acute distress, alert and oriented x3   Dermatology: Skin is warm, dry and supple bilateral.  Lateral border of the right great toe and medial border of the left great toe appears to be erythematous with evidence of an ingrowing nail. Pain on palpation noted to the border of the nail fold. The remaining nails appear unremarkable at this time. There are no open sores, lesions.  Vascular: Dorsalis Pedis artery and Posterior Tibial artery pedal pulses palpable. No lower extremity edema noted.   Neruologic: Grossly intact via light touch bilateral.  Musculoskeletal: Muscular strength within normal limits in all groups bilateral. Normal range of motion noted to all pedal and ankle joints.   Assesement: #1 Paronychia with ingrowing nail lateral border of the right great toe and medial border of the left great toe #2 Pain in toe #3 Incurvated nail  Plan of Care:  1. Patient evaluated.  2. Discussed treatment alternatives and plan of care. Explained nail  avulsion procedure and post procedure course to patient. 3. Patient opted for permanent partial nail avulsion of both toes.  4. Prior to procedure, local anesthesia infiltration utilized using 3 ml of a 50:50 mixture of 2% plain lidocaine and 0.5% plain marcaine in a normal hallux block fashion and a betadine prep performed.  5. Partial permanent nail avulsions with chemical matrixectomy performed using [id number] applications of phenol followed by alcohol flush.  6. Light dressings applied. 7. Return to clinic in 2 weeks.     Edrick Kins, DPM [facility]  Dr. Edrick Kins, [address]                                        [place], [place] [address]                Office [phone]  Fax [phone]

## 2017-09-06 ENCOUNTER — Ambulatory Visit (INDEPENDENT_AMBULATORY_CARE_PROVIDER_SITE_OTHER): Payer: Medicare HMO

## 2017-09-06 DIAGNOSIS — Z7901 Long term (current) use of anticoagulants: Secondary | ICD-10-CM

## 2017-09-06 DIAGNOSIS — Z5181 Encounter for therapeutic drug level monitoring: Secondary | ICD-10-CM | POA: Diagnosis not present

## 2017-09-06 DIAGNOSIS — I4891 Unspecified atrial fibrillation: Secondary | ICD-10-CM

## 2017-09-06 LAB — POCT INR: INR: 3.5

## 2017-09-06 NOTE — Patient Instructions (Signed)
Please take 1/2 tablet today and tomorrow, then resume dosage of 1 tablet daily except 1/2 tablet on Mondays and Fridays.  Recheck in 3 weeks.

## 2017-09-13 ENCOUNTER — Ambulatory Visit: Payer: Medicare HMO | Admitting: Family Medicine

## 2017-09-13 ENCOUNTER — Telehealth: Payer: Self-pay | Admitting: Family Medicine

## 2017-09-13 ENCOUNTER — Other Ambulatory Visit: Payer: Self-pay

## 2017-09-13 ENCOUNTER — Encounter: Payer: Self-pay | Admitting: Family Medicine

## 2017-09-13 VITALS — BP 140/70 | HR 58 | Temp 97.4°F | Ht 67.5 in | Wt 167.0 lb

## 2017-09-13 DIAGNOSIS — B719 Cestode infection, unspecified: Secondary | ICD-10-CM

## 2017-09-13 DIAGNOSIS — Z79899 Other long term (current) drug therapy: Secondary | ICD-10-CM | POA: Diagnosis not present

## 2017-09-13 DIAGNOSIS — A071 Giardiasis [lambliasis]: Secondary | ICD-10-CM

## 2017-09-13 DIAGNOSIS — Z9229 Personal history of other drug therapy: Secondary | ICD-10-CM | POA: Diagnosis not present

## 2017-09-13 MED ORDER — METRONIDAZOLE 1 % EX CREA: TOPICAL_CREAM | Freq: Every day | CUTANEOUS | 5 refills | Status: DC

## 2017-09-13 MED ORDER — PRAZIQUANTEL 600 MG PO TABS: 1200.0000 mg | ORAL_TABLET | Freq: Every day | ORAL | 0 refills | Status: DC

## 2017-09-13 MED ORDER — TINIDAZOLE 500 MG PO TABS: 2.0000 g | ORAL_TABLET | Freq: Every day | ORAL | 0 refills | Status: DC

## 2017-09-13 MED ORDER — ALBENDAZOLE 200 MG PO TABS: 400.0000 mg | ORAL_TABLET | Freq: Every day | ORAL | 0 refills | Status: DC

## 2017-09-13 MED ORDER — METRONIDAZOLE 0.75 % EX CREA: TOPICAL_CREAM | Freq: Two times a day (BID) | CUTANEOUS | 5 refills | Status: DC

## 2017-09-13 NOTE — Telephone Encounter (Signed)
Yes, 0.75% metronidazole is fine

## 2017-09-13 NOTE — Telephone Encounter (Signed)
New Rx for Metronidazole 0.75% cream sent to [facility].

## 2017-09-13 NOTE — Progress Notes (Signed)
Dr. Frederico Hamman T. Abass Misener, MD, [facility] [place], [address] Phone: [phone] Fax: [phone]  09/13/2017  Patient: [name], MRN: [id number], DOB: 1945/09/13, 72 y.o.  Primary Physician:  Owens Loffler, MD   Chief Complaint  Patient presents with  . Tapeworm    Needs treatment  . Giardia   Subjective:   [name] is a 72 y.o. very pleasant female patient who presents with the following:  Rosacea -  Ongoing issues, needs cream.   Tapeworm and giardia + Her dog tested + for both with her vet who subsequently tested both she and her husband and they were both positive.   Also on coumadin and amiodarone  Past Medical History, Surgical History, Social History, Family History, Problem List, Medications, and Allergies have been reviewed and updated if relevant.  Patient Active Problem List   Diagnosis Date Noted  . Mild dementia 10/24/2014    Priority: High  . Chronic combined systolic and diastolic CHF, NYHA class 1 ([place]) 09/29/2014    Priority: High  . 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 ([place]) 09/29/2014    Priority: High  . 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 Generalized anxiety disorder 11/18/2008    Priority: Medium  . Major depressive disorder, recurrent episode, moderate with anxious distress ([place]) 11/18/2008    Priority: Medium  . Primary osteoarthritis of both knees 10/12/2016  . Fracture of left pelvis ([place]) 04/29/2015  . PAF (paroxysmal atrial fibrillation) ([place]) 03/04/2015  . On amiodarone therapy 11/14/2014  . Current use of long term anticoagulation 11/14/2014  . Hyperlipidemia LDL goal <100 04/05/2010  . GERD 04/05/2010  . Hypothyroid 11/18/2008  . ASTHMA 11/18/2008  . Mixed incontinence urge and stress 11/18/2008  . NEPHROLITHIASIS, HX OF 11/18/2008    Past Medical History:  Diagnosis Date  . Acne  rosacea   . Anxiety   . Asthma   . Atrial fibrillation and flutter ([place]) 09/2014   Revereted from Afib RVR to 2:1 A Flutter -- TEE/ DCCV 2/9; on Amiodarone  . 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 Mod LA dilation, normal PA pressures   . CVA (cerebral infarction)    h/o superior cerebellar infarct  . Depression   . Digoxin toxicity 09/2014  . 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 ([place]) 09/2014   a) [facility] Echo: EF ~10% Severe LV dilation & global LV Systolic dysfxn, restrictive filling pattern - Gr 3 DD, mod RV dysfxn, severe LA dilation - 6.4 cm, mod RA dil, mod pl effusion, mod MR, mild TR; b) TEE 10/07/14: EF ~10%, Severe LV dilation & dysfxn Mod RV dysfxn, LAA oversewn, Mod RA & TR  . Diverticulosis   . H/O: rheumatic fever    Childhood  . HYPERLIPIDEMIA 04/05/2010   Qualifier: Diagnosis of  By: Lorelei Pont MD, Frederico Hamman    . Hypothyroid    On replacement therapy; normal TSH 09/2014  . Migraine headache   . Osteopenia   . Paroxysmal atrial fibrillation ([place]) 2005; Recurrent 09/2014   a) 2005: s/p Cox Maze & LAA ligation; b) 09/2014: TEE-cardioversion  . Rheumatic mitral and aortic valve insufficiency 2005   a) s/p MV Ring repair; with  Cox-Maze for Afib; b) Echo 2013: EF 60-65%,no Regional WMA, Gr 2 DD, no Sig MR ;; c) TEE 10/07/2014: Severlely thickened and calcified MV leaflets. Posterior MV leaflet is fixed and immobile. MAC. reduced excursion of the anterior MV leaflet. Mean MV gradient was 63m Hg and MVA calculated at cm2.    . Urinary incontinence     Past Surgical History:  Procedure Laterality Date  . CARDIAC CATHETERIZATION  Jan 2005   Pre-op R&LHC -- Nonobstructive CAD; normal PA pressures  . CARDIOVERSION  06/04/2012   Procedure:  CARDIOVERSION;  Surgeon: JCarlena Bjornstad MD;  [facility];  Service: Cardiovascular;  Laterality: N/A;  . CARDIOVERSION N/A 10/07/2014   Procedure: CARDIOVERSION;  Surgeon: TSueanne Margarita MD;  [place] [facility]  Service: Cardiovascular;  Laterality: N/A;  . COLONOSCOPY WITH PROPOFOL N/A 07/11/2017   Procedure: COLONOSCOPY WITH PROPOFOL;  Surgeon: Tol[place], TBenay Pike MD;  Location: [facility] ENDOSCOPY;  Service: Gastroenterology;  Laterality: N/A;  . LUMBAR DISC SURGERY     x 2 distantly  . MITRAL VALVE REPAIR  2005   with Cox Maze for [employer] . NM MYOVIEW LTD  4/7/'16   EF 37% with septal hypokinesis and diffuse hypokinesis. No ischemia or infarction. 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  .Marland KitchenRIGHT HEART CATHETERIZATION N/A 10/03/2014   Procedure: RIGHT HEART CATH;  Surgeon: DJolaine Artist MD;  [facility];  Service: Cardiovascular;  RAP 5102mg, RVP 35/2/9 mmHg, PAP 45/12 mmHg, PCWP 16 mmHg; CO/I by Fick: 3.9/2.3; Ao/PA/SVC SaO2%: 97%/63%/65%.  . TEE WITHOUT CARDIOVERSION N/A 10/07/2014   Procedure: TRANSESOPHAGEAL ECHOCARDIOGRAM (TEE);  Surgeon: TrSueanne MargaritaMD;  [facility];  Service: Cardiovascular;  Laterality: N/A;  . THYROIDECTOMY, PARTIAL     partial-no cancer; now on thyroid replacement  . TRANSTHORACIC ECHOCARDIOGRAM  2013; 2/'16 & 5/'16   a) 2013: EF 60-65%, mild MR, Gr 2 DD; b) [facility] [facility] - severel global LV dysfunction (systolic & diastolic) - dilated LV & restrictive filling pattern - Gr 3 DD, mod reduced RV function, severely dilated LA - 6.4 cm, mod dilated RA, mod pl effusion, mod MR, mild TR; c) 5/10/'16:  EF 25-30%, mod LV dilation, no RWMA,Gr 3 DD w/ high LAP, MV sewing ring intact w/ Mod MR, Severe LA dilation  . TRANSTHORACIC ECHOCARDIOGRAM  January 2017:   EF improved to 45-50%. Mild diffuse HK. Mild MR. Moderate LA dilation. Normal PA pressures.    Social History    Socioeconomic History  . Marital status: Married    Spouse name: Not on file  . Number of children: 2  . Years of education: Not on file  . Highest education level: Not on file  Social Needs  . Financial resource strain: Not on file  . Food insecurity - worry: Not on file  . Food insecurity - inability: Not on file  . Transportation needs - medical: Not on file  . Transportation needs - non-medical: Not on file  Occupational History  . Occupation: Retired  Tobacco Use  . Smoking status: Never Smoker  . Smokeless tobacco: Never Used  Substance and Sexual Activity  . Alcohol use: No  . Drug use: No  . Sexual activity: Yes  Other Topics Concern  . Not on file  Social History Narrative  . Not on file    Family History  Problem Relation Age of Onset  . Diabetes Mother   . Coronary  artery disease Mother   . Kidney disease Mother   . Breast cancer Neg Hx     No Known Allergies  Medication list reviewed and updated in full in [place].   GEN: No acute illnesses, no fevers, chills. GI: No n/v/d, eating normally Pulm: No SOB Interactive and getting along well at home.  Otherwise, ROS is as per the HPI.  Objective:   BP 140/70   Pulse (!) 58   Temp (!) 97.4 F (36.3 C) (Oral)   Ht 5' 7.5" (1.715 m)   Wt 167 lb (75.8 kg)   BMI 25.77 kg/m   GEN: WDWN, NAD, Non-toxic, A & O x 3 HEENT: Atraumatic, Normocephalic. Neck supple. No masses, No LAD. Ears and Nose: No external deformity. EXTR: No c/c/e NEURO Normal gait.  PSYCH: Normally interactive. Conversant. Not depressed or anxious appearing.  Calm demeanor.   Laboratory and Imaging Data:  Assessment and Plan:   Tapeworm infection  Giardia  On amiodarone therapy  History of Coumadin therapy  >25 minutes spent in face to face time with patient, >50% spent in counselling or coordination of care: Additional time due to multiple meds needing to be changed due to interaction with coumadin and  amiodarone. Some abd discomfort with some nausea but no diarrhea.   Rosacea meds, too  F/u INR check next week  Follow-up: No Follow-up on file.  Meds ordered this encounter  Medications  . metronidazole (NORITATE) 1 % cream    Sig: Apply topically daily. Generic metronidazole cream    Dispense:  60 g    Refill:  5  . albendazole (ALBENZA) 200 MG tablet    Sig: Take 2 tablets (400 mg total) by mouth daily.    Dispense:  6 tablet    Refill:  0  . praziquantel (BILTRICIDE) 600 MG tablet    Sig: Take 2 tablets (1,200 mg total) by mouth daily.    Dispense:  6 tablet    Refill:  0  . tinidazole (TINDAMAX) 500 MG tablet    Sig: Take 4 tablets (2,000 mg total) by mouth daily with breakfast.    Dispense:  12 tablet    Refill:  0    Signed,  Abimael Zeiter T. Mitsuru Dault, MD   Allergies as of 09/13/2017   No Known Allergies     Medication List        Accurate as of 09/13/17 10:52 AM. Always use your most recent med list.          acetaminophen 325 MG tablet Commonly known as:  TYLENOL Take 2 tablets (650 mg total) by mouth every 4 (four) hours as needed for mild pain, fever or headache.   albendazole 200 MG tablet Commonly known as:  ALBENZA Take 2 tablets (400 mg total) by mouth daily.   amiodarone 200 MG tablet Commonly known as:  PACERONE Take 1 tablet (200 mg total) by mouth daily.   b complex vitamins tablet Take 1 tablet daily by mouth.   benzonatate 200 MG capsule Commonly known as:  TESSALON Take 200 mg 3 (three) times daily as needed by mouth for cough.   benzonatate 200 MG capsule Commonly known as:  TESSALON Take 1 capsule (200 mg total) by mouth 3 (three) times daily as needed for cough.   CALCIUM + D PO Take 1 capsule daily by mouth.   carvedilol 3.125 MG tablet Commonly known as:  COREG TAKE ONE TABLET BY MOUTH TWICE DAILY   donepezil 5 MG tablet  Commonly known as:  ARICEPT TAKE ONE TABLET BY MOUTH AT BEDTIME   FISH OIL PO Take 1 capsule by  mouth daily.   IRON (FERROUS SULFATE) PO Take 1 tablet by mouth daily.   levothyroxine 25 MCG tablet Commonly known as:  SYNTHROID, LEVOTHROID TAKE TWO AND ONE-HALF TABLETS BY MOUTH DAILY BEFORE BREAKFAST   losartan 25 MG tablet Commonly known as:  COZAAR TAKE ONE TABLET BY MOUTH EVERY DAY   metronidazole 1 % cream Commonly known as:  NORITATE Apply topically daily. Generic metronidazole cream   polyethylene glycol powder powder Commonly known as:  GLYCOLAX/MIRALAX   potassium chloride SA 20 MEQ tablet Commonly known as:  K-DUR,KLOR-CON Take 20 mEq by mouth daily.   praziquantel 600 MG tablet Commonly known as:  BILTRICIDE Take 2 tablets (1,200 mg total) by mouth daily.   tinidazole 500 MG tablet Commonly known as:  TINDAMAX Take 4 tablets (2,000 mg total) by mouth daily with breakfast.   tolterodine 4 MG 24 hr capsule Commonly known as:  DETROL LA Take 4 mg by mouth daily.   warfarin 5 MG tablet Commonly known as:  COUMADIN Take as directed by the anticoagulation clinic. If you are unsure how to take this medication, talk to your nurse or doctor. Original instructions:  Take 1 to 1 and 1/2 tablets every day as directed by coumadin clinic

## 2017-09-13 NOTE — Patient Instructions (Signed)
Move up INR Coumadin check in [facility] office to Monday or Tuesday of next week.

## 2017-09-13 NOTE — Telephone Encounter (Signed)
Copied from [place] [phone]. Topic: Quick Communication - See Telephone Encounter >> Sep 13, 2017  9:11 AM Burnis Medin, NT wrote: CRM for notification. See Telephone encounter for: Johny Shears is calling because doctor sent a prescription for metronidazole (NORITATE) 1 % cream and pharmacy is not able to get medication and was wanting to see if they can switch it to metronidazole (NORITATE)  75 % cream. She would like a call back.  09/13/17.

## 2017-09-14 ENCOUNTER — Telehealth: Payer: Self-pay

## 2017-09-14 NOTE — Telephone Encounter (Signed)
Copied from [place] [phone]. Topic: General - Other >> Sep 14, 2017 10:10 AM Oneta Rack wrote:  Relation to pt: self  Call back number: [phone]  Reason for call:  Patient in need of clarification regarding medication prescribed yesterday at 09/13/17  with Dr. Lorelei Pont. Unclear on what medication patient would like to dicuss, patient did state medication was prescribed for tape worm. Please advise  >> Sep 14, 2017 10:11 AM Oneta Rack wrote:  Relation to pt: self  Call back number: [phone]  Reason for call:  Patient in need of clarification regarding medication prescribed yesterday at 09/13/17  with Dr. Lorelei Pont. Unclear on what medication patient would like to dicuss, patient did state medication was prescribed for tape worm. Please advise

## 2017-09-14 NOTE — Telephone Encounter (Signed)
I spoke with pt and she did not get albendazole but did get generic detrol LA at [facility]; I spoke with Lattie Haw at [facility] and she said the albendazole should be in today and pt can call later today to make sure ready for pick up. The detrol LA was filled because [facility] from Dr Bernardo Heater. Pt voiced understanding and will ck with [facility].

## 2017-09-15 ENCOUNTER — Ambulatory Visit (INDEPENDENT_AMBULATORY_CARE_PROVIDER_SITE_OTHER): Payer: Medicare HMO | Admitting: Podiatry

## 2017-09-15 ENCOUNTER — Encounter: Payer: Self-pay | Admitting: Podiatry

## 2017-09-15 DIAGNOSIS — L600 Ingrowing nail: Secondary | ICD-10-CM | POA: Diagnosis not present

## 2017-09-15 NOTE — Progress Notes (Signed)
Subjective: Patient presents today 2 weeks post ingrown nail permanent nail avulsion procedure of the lateral border of the right great toenail and the medial border of the left great toenail. Patient states that the toes and nail folds are feeling much better but does report some soreness of the left great toe. Patient is here for further evaluation and treatment.   Past Medical History:  Diagnosis Date  . Acne rosacea   . Anxiety   . Asthma   . Atrial fibrillation and flutter ([place]) 09/2014   Revereted from Afib RVR to 2:1 A Flutter -- TEE/ DCCV 2/9; on Amiodarone  . 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 Mod LA dilation, normal PA pressures   . CVA (cerebral infarction)    h/o superior cerebellar infarct  . Depression   . Digoxin toxicity 09/2014  . 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 ([place]) 09/2014   a) [facility] Echo: EF ~10% Severe LV dilation & global LV Systolic dysfxn, restrictive filling pattern - Gr 3 DD, mod RV dysfxn, severe LA dilation - 6.4 cm, mod RA dil, mod pl effusion, mod MR, mild TR; b) TEE 10/07/14: EF ~10%, Severe LV dilation & dysfxn Mod RV dysfxn, LAA oversewn, Mod RA & TR  . Diverticulosis   . H/O: rheumatic fever    Childhood  . HYPERLIPIDEMIA 04/05/2010   Qualifier: Diagnosis of  By: Lorelei Pont MD, Frederico Hamman    . Hypothyroid    On replacement therapy; normal TSH 09/2014  . Migraine headache   . Osteopenia   . Paroxysmal atrial fibrillation ([place]) 2005; Recurrent 09/2014   a) 2005: s/p Cox Maze & LAA ligation; b) 09/2014: TEE-cardioversion  . Rheumatic mitral and aortic valve insufficiency 2005   a) s/p MV Ring repair; with Cox-Maze for Afib; b) Echo 2013: EF 60-65%,no Regional WMA, Gr 2 DD, no Sig MR ;; c) TEE 10/07/2014: Severlely  thickened and calcified MV leaflets. Posterior MV leaflet is fixed and immobile. MAC. reduced excursion of the anterior MV leaflet. Mean MV gradient was 24m Hg and MVA calculated at cm2.    . Urinary incontinence     Objective: Skin is warm, dry and supple. Nail and respective nail fold appears to be healing appropriately. Open wound to the associated nail fold with a granular wound base and moderate amount of fibrotic tissue. Minimal drainage noted. Mild erythema around the periungual region likely due to phenol chemical matricectomy.  Assessment: #1 postop permanent partial nail avulsion lateral border of right great toenail and medial border of left great toenail #2 open wound periungual nail fold of respective digit.   Plan of care: #1 patient was evaluated  #2 debridement of open wound was performed to the periungual border of the respective toe using a currette. Antibiotic ointment and Band-Aid was applied. #3 patient is to return to clinic on a PRN basis.   BEdrick Kins DPM [facility]  Dr. BEdrick Kins [place]                                       [place] [address]  Office [phone]  Fax [phone]

## 2017-09-20 ENCOUNTER — Ambulatory Visit (INDEPENDENT_AMBULATORY_CARE_PROVIDER_SITE_OTHER): Payer: Medicare HMO

## 2017-09-20 DIAGNOSIS — Z7901 Long term (current) use of anticoagulants: Secondary | ICD-10-CM

## 2017-09-20 DIAGNOSIS — Z5181 Encounter for therapeutic drug level monitoring: Secondary | ICD-10-CM

## 2017-09-20 DIAGNOSIS — I4891 Unspecified atrial fibrillation: Secondary | ICD-10-CM

## 2017-09-20 LAB — POCT INR: INR: 2.5

## 2017-09-20 NOTE — Patient Instructions (Signed)
Please continue dosage of 1 tablet daily except 1/2 tablet on Mondays and Fridays.  Recheck in 3 weeks.

## 2017-09-23 ENCOUNTER — Other Ambulatory Visit: Payer: Self-pay | Admitting: Internal Medicine

## 2017-10-09 ENCOUNTER — Ambulatory Visit (INDEPENDENT_AMBULATORY_CARE_PROVIDER_SITE_OTHER): Payer: Medicare HMO

## 2017-10-09 DIAGNOSIS — I4891 Unspecified atrial fibrillation: Secondary | ICD-10-CM

## 2017-10-09 DIAGNOSIS — Z7901 Long term (current) use of anticoagulants: Secondary | ICD-10-CM

## 2017-10-09 DIAGNOSIS — Z5181 Encounter for therapeutic drug level monitoring: Secondary | ICD-10-CM

## 2017-10-09 LAB — POCT INR: INR: 2.8

## 2017-10-09 NOTE — Patient Instructions (Signed)
Please continue dosage of 1 tablet daily except 1/2 tablet on Mondays and Fridays.  Recheck in 4 weeks.

## 2017-10-11 ENCOUNTER — Ambulatory Visit: Payer: Medicare HMO | Admitting: Family Medicine

## 2017-10-11 ENCOUNTER — Other Ambulatory Visit: Payer: Self-pay

## 2017-10-11 ENCOUNTER — Encounter: Payer: Self-pay | Admitting: Family Medicine

## 2017-10-11 VITALS — BP 130/70 | HR 55 | Temp 98.4°F | Ht 67.5 in | Wt 171.2 lb

## 2017-10-11 DIAGNOSIS — G8929 Other chronic pain: Secondary | ICD-10-CM

## 2017-10-11 DIAGNOSIS — M25561 Pain in right knee: Secondary | ICD-10-CM | POA: Diagnosis not present

## 2017-10-11 DIAGNOSIS — I059 Rheumatic mitral valve disease, unspecified: Secondary | ICD-10-CM | POA: Diagnosis not present

## 2017-10-11 DIAGNOSIS — M25562 Pain in left knee: Secondary | ICD-10-CM

## 2017-10-11 DIAGNOSIS — M170 Bilateral primary osteoarthritis of knee: Secondary | ICD-10-CM

## 2017-10-11 DIAGNOSIS — I058 Other rheumatic mitral valve diseases: Secondary | ICD-10-CM

## 2017-10-11 MED ORDER — METHYLPREDNISOLONE ACETATE 40 MG/ML IJ SUSP
80.0000 mg | Freq: Once | INTRAMUSCULAR | Status: AC
  Administered 2017-10-11: 80 mg via INTRA_ARTICULAR

## 2017-10-11 NOTE — Patient Instructions (Signed)
Insurance coverage for VISCOSUPPLEMENTATION INJECTIONS, also known as hyaluronic acid.  Examples: Monovisc Synvisc Orthovisc

## 2017-10-11 NOTE — Progress Notes (Signed)
Dr. Frederico Hamman T. Damiano Stamper, MD, [facility] [place], [address] Phone: [phone] Fax: [phone]  10/11/2017  Patient: [name], MRN: [id number], DOB: [birth year], 72 y.o.  Primary Physician:  Owens Loffler, MD   Chief Complaint  Patient presents with  . Knee Pain    Bilateral   Subjective:   [name] is a 72 y.o. very pleasant female patient who presents with the following:  B knee injections: Patient has known bilateral osteoarthritis of both knees.  We did do some injections in October of this past year, and she had good relief of symptoms after that.  I aspirated her right knee at that time.  There were no crystals.  She is on Coumadin as well as amiodarone chronically.  Past Medical History, Surgical History, Social History, Family History, Problem List, Medications, and Allergies have been reviewed and updated if relevant.  Patient Active Problem List   Diagnosis Date Noted  . Mild dementia 10/24/2014    Priority: High  . Chronic combined systolic and diastolic CHF, NYHA class 1 ([place]) 09/29/2014    Priority: High  . 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 ([place]) 09/29/2014    Priority: High  . 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 Generalized anxiety disorder 11/18/2008    Priority: Medium  . Major depressive disorder, recurrent episode, moderate with anxious distress ([place]) 11/18/2008    Priority: Medium  . Primary osteoarthritis of both knees 10/12/2016  . Fracture of left pelvis ([place]) 04/29/2015  . PAF (paroxysmal atrial fibrillation) ([place]) 03/04/2015  . On amiodarone therapy 11/14/2014  . Current use of long term anticoagulation 11/14/2014  . Hyperlipidemia LDL goal <100 04/05/2010  . GERD 04/05/2010  . Hypothyroid 11/18/2008  . ASTHMA 11/18/2008  . Mixed incontinence urge and stress 11/18/2008  . NEPHROLITHIASIS, HX OF  11/18/2008    Past Medical History:  Diagnosis Date  . Acne rosacea   . Anxiety   . Asthma   . Atrial fibrillation and flutter ([place]) 09/2014   Revereted from Afib RVR to 2:1 A Flutter -- TEE/ DCCV 2/9; on Amiodarone  . Chronic combined systolic and diastolic CHF, NYHA class 1 ([facility]) -- Systolic dysfunction [id number][N86.76] 09/2014   Exacerbation 09/2014 2/2 Afib/flutter with RVR - 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 Mod LA dilation, normal PA pressures   . CVA (cerebral infarction)    h/o superior cerebellar infarct  . Depression   . Digoxin toxicity 09/2014  . 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 Diverticulosis   . H/O: rheumatic fever    Childhood  . HYPERLIPIDEMIA 04/05/2010   Qualifier: Diagnosis of  By: Lorelei Pont MD, Frederico Hamman    . Hypothyroid    On replacement therapy; normal TSH 09/2014  . Migraine headache   . Osteopenia   . Paroxysmal atrial fibrillation ([place]) 2005; Recurrent 09/2014   a) 2005: s/p Cox Maze & LAA ligation; b) 09/2014: TEE-cardioversion  . Rheumatic mitral and aortic  valve insufficiency 2005   a) s/p MV Ring repair; with Cox-Maze for Afib; b) Echo 2013: EF 60-65%,no Regional WMA, Gr 2 DD, no Sig MR ;; c) TEE 10/07/2014: Severlely thickened and calcified MV leaflets. Posterior MV leaflet is fixed and immobile. MAC. reduced excursion of the anterior MV leaflet. Mean MV gradient was 11m Hg and MVA calculated at cm2.    . Urinary incontinence     Past Surgical History:  Procedure Laterality Date  . CARDIAC CATHETERIZATION  Jan 2005   Pre-op R&LHC -- Nonobstructive CAD; normal  PA pressures  . CARDIOVERSION  06/04/2012   Procedure: CARDIOVERSION;  Surgeon: JCarlena Bjornstad MD;  [facility];  Service: Cardiovascular;  Laterality: N/A;  . CARDIOVERSION N/A 10/07/2014   Procedure: CARDIOVERSION;  Surgeon: TSueanne Margarita MD;  [facility]  Service: Cardiovascular;  Laterality: N/A;  . COLONOSCOPY WITH PROPOFOL N/A 07/11/2017   Procedure: COLONOSCOPY WITH PROPOFOL;  Surgeon: [place], TBenay Pike MD;  Location: [facility] ENDOSCOPY;  Service: Gastroenterology;  Laterality: N/A;  . LUMBAR DISC SURGERY     x 2 distantly  . MITRAL VALVE REPAIR  2005   with [facility] for [employer] . NM MYOVIEW LTD  4/7/'16   EF 37% with septal hypokinesis and diffuse hypokinesis. No ischemia or infarction. 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  .Marland KitchenRIGHT HEART CATHETERIZATION N/A 10/03/2014   Procedure: RIGHT HEART CATH;  Surgeon: DJolaine Artist MD;  [facility];  Service: Cardiovascular;  RAP 578mg, RVP 35/2/9 mmHg, PAP 45/12 mmHg, PCWP 16 mmHg; CO/I by Fick: 3.9/2.3; Ao/PA/SVC SaO2%: 97%/63%/65%.  . TEE WITHOUT CARDIOVERSION N/A 10/07/2014   Procedure: TRANSESOPHAGEAL ECHOCARDIOGRAM (TEE);  Surgeon: TrSueanne MargaritaMD;  [facility];  Service: Cardiovascular;  Laterality: N/A;  . THYROIDECTOMY, PARTIAL     partial-no cancer; now on thyroid replacement  . TRANSTHORACIC ECHOCARDIOGRAM  2013; 2/'16 & 5/'16   a) 2013: EF 60-65%, mild MR, Gr 2 DD; [place] - severel global LV dysfunction (systolic & diastolic) - dilated LV & restrictive filling pattern - Gr 3 DD, mod reduced RV function, severely dilated LA - 6.4 cm, mod dilated RA, mod pl effusion, mod MR, mild TR; c) 5/10/'16:  EF 25-30%, mod LV dilation, no RWMA,Gr 3 DD w/ high LAP, MV sewing ring intact w/ Mod MR, Severe LA dilation  . TRANSTHORACIC ECHOCARDIOGRAM  January 2017:   EF improved to 45-50%. Mild diffuse HK. Mild MR. Moderate LA  dilation. Normal PA pressures.    Social History   Socioeconomic History  . Marital status: Married    Spouse name: Not on file  . Number of children: 2  . Years of education: Not on file  . Highest education level: Not on file  Social Needs  . Financial resource strain: Not on file  . Food insecurity - worry: Not on file  . Food insecurity - inability: Not on file  . Transportation needs - medical: Not on file  . Transportation needs - non-medical: Not on file  Occupational History  . Occupation: Retired  Tobacco Use  . Smoking status: Never Smoker  . Smokeless tobacco: Never Used  Substance and Sexual Activity  . Alcohol use: No  . Drug use: No  . Sexual activity: Yes  Other Topics Concern  . Not on file  Social History Narrative  . Not on file    Family History  Problem Relation  Age of Onset  . Diabetes Mother   . Coronary artery disease Mother   . Kidney disease Mother   . Breast cancer Neg Hx     No Known Allergies  Medication list reviewed and updated in full in [place].  GEN: No fevers, chills. Nontoxic. Primarily MSK c/o today. MSK: Detailed in the HPI GI: tolerating PO intake without difficulty Neuro: No numbness, parasthesias, or tingling associated. Otherwise the pertinent positives of the ROS are noted above.   Objective:   BP 130/70   Pulse (!) 55   Temp 98.4 F (36.9 C) (Oral)   Ht 5' 7.5" (1.715 m)   Wt 171 lb 4 oz (77.7 kg)   BMI 26.43 kg/m    GEN: WDWN, NAD, Non-toxic, Alert & Oriented x 3 HEENT: Atraumatic, Normocephalic.  Ears and Nose: No external deformity. EXTR: No clubbing/cyanosis/edema NEURO: Normal gait.  PSYCH: Normally interactive. Conversant. Not depressed or anxious appearing.  Calm demeanor.   Knee:  b Gait: Normal heel toe pattern ROM: 0-120 Effusion: minimal on the R Echymosis or edema: none Patellar tendon NT Painful PLICA: neg Patellar grind: negative Medial and lateral patellar facet loading:  negative medial and lateral joint lines: medial > lateral joint line tenderness Mcmurray's neg Flexion-pinch neg Varus and valgus stress: stable Lachman: neg Ant and Post drawer: neg Hip abduction, IR, ER: WNL Hip flexion str: 5/5 Hip abd: 5/5 Quad: 5/5 VMO atrophy:No Hamstring concentric and eccentric: 5/5   Radiology: No results found.  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  Knee Injection, RIGHT Patient verbally consented to procedure. Risks (including potential rare risk of infection), benefits, and alternatives explained. Sterilely prepped with Chloraprep. Ethyl cholride used for anesthesia. 8 cc Lidocaine 1% mixed with 2 mL Depo-Medrol 40 mg injected using the anteromedial approach without difficulty. No complications with procedure and tolerated well. Patient had decreased pain post-injection.   Knee Injection, LEFT Patient verbally consented to procedure. Risks (including potential rare risk of infection), benefits, and alternatives explained. Sterilely prepped with Chloraprep. Ethyl cholride used for anesthesia. 8 cc Lidocaine 1% mixed with 2 mL Depo-Medrol 40 mg injected using the anteromedial approach without difficulty. No complications with procedure and tolerated well. Patient had decreased pain post-injection.   Follow-up: No Follow-up on file.  Meds ordered this encounter  Medications  . methylPREDNISolone acetate (DEPO-MEDROL) injection 80 mg  . methylPREDNISolone acetate (DEPO-MEDROL) injection 80 mg   Signed,  Kami Kube T. Sanyla Summey, MD   Allergies as of 10/11/2017   No Known Allergies     Medication List         Accurate as of 10/11/17  5:01 PM. Always use your most recent med list.          acetaminophen 325 MG tablet Commonly known as:  TYLENOL Take 2 tablets (650 mg total) by mouth every 4 (four) hours as needed for mild pain, fever or headache.   albendazole 200 MG tablet Commonly known as:  ALBENZA Take 2 tablets (400 mg total) by mouth daily.   amiodarone 200 MG tablet Commonly known as:  PACERONE TAKE ONE TABLET BY MOUTH EVERY DAY   b complex vitamins tablet Take 1 tablet daily  by mouth.   benzonatate 200 MG capsule Commonly known as:  TESSALON Take 200 mg 3 (three) times daily as needed by mouth for cough.   benzonatate 200 MG capsule Commonly known as:  TESSALON Take 1 capsule (200 mg total) by mouth 3 (three) times daily as needed for cough.   CALCIUM + D PO Take 1 capsule daily by mouth.   carvedilol 3.125 MG tablet Commonly known as:  COREG TAKE ONE TABLET BY MOUTH TWICE DAILY   donepezil 5 MG tablet Commonly known as:  ARICEPT TAKE ONE TABLET BY MOUTH AT BEDTIME   FISH OIL PO Take 1 capsule by mouth daily.   IRON (FERROUS SULFATE) PO Take 1 tablet by mouth daily.   levothyroxine 25 MCG tablet Commonly known as:  SYNTHROID, LEVOTHROID TAKE TWO AND ONE-HALF TABLETS BY MOUTH DAILY BEFORE BREAKFAST   losartan 25 MG tablet Commonly known as:  COZAAR TAKE ONE TABLET BY MOUTH EVERY DAY   metroNIDAZOLE 0.75 % cream Commonly known as:  METROCREAM Apply topically 2 (two) times daily.   polyethylene glycol powder powder Commonly known as:  GLYCOLAX/MIRALAX   potassium chloride SA 20 MEQ tablet Commonly known as:  K-DUR,KLOR-CON Take 20 mEq by mouth daily.   praziquantel 600 MG tablet Commonly known as:  BILTRICIDE Take 2 tablets (1,200 mg total) by mouth daily.   tinidazole 500 MG tablet Commonly known as:  TINDAMAX Take 4 tablets (2,000 mg total) by mouth daily with breakfast.   tolterodine 4 MG 24 hr capsule Commonly known as:  DETROL LA Take 4  mg by mouth daily.   warfarin 5 MG tablet Commonly known as:  COUMADIN Take as directed by the anticoagulation clinic. If you are unsure how to take this medication, talk to your nurse or doctor. Original instructions:  Take 1 to 1 and 1/2 tablets every day as directed by coumadin clinic

## 2017-11-06 ENCOUNTER — Ambulatory Visit (INDEPENDENT_AMBULATORY_CARE_PROVIDER_SITE_OTHER): Payer: Medicare HMO

## 2017-11-06 DIAGNOSIS — Z5181 Encounter for therapeutic drug level monitoring: Secondary | ICD-10-CM

## 2017-11-06 DIAGNOSIS — I5042 Chronic combined systolic (congestive) and diastolic (congestive) heart failure: Secondary | ICD-10-CM | POA: Diagnosis not present

## 2017-11-06 DIAGNOSIS — Z7901 Long term (current) use of anticoagulants: Secondary | ICD-10-CM | POA: Diagnosis not present

## 2017-11-06 DIAGNOSIS — I4891 Unspecified atrial fibrillation: Secondary | ICD-10-CM | POA: Diagnosis not present

## 2017-11-06 LAB — POCT INR: INR: 4.6

## 2017-11-06 NOTE — Patient Instructions (Signed)
Please skip coumadin today and tomorrow, then resume dosage of 1 tablet daily except 1/2 tablet on Mondays and Fridays. Please be consistent with your greens intake.  Recheck in 4 weeks.

## 2017-11-09 DIAGNOSIS — Z7901 Long term (current) use of anticoagulants: Secondary | ICD-10-CM | POA: Diagnosis not present

## 2017-11-09 DIAGNOSIS — I4891 Unspecified atrial fibrillation: Secondary | ICD-10-CM | POA: Diagnosis not present

## 2017-11-09 DIAGNOSIS — I509 Heart failure, unspecified: Secondary | ICD-10-CM | POA: Diagnosis not present

## 2017-11-09 DIAGNOSIS — I110 Hypertensive heart disease with heart failure: Secondary | ICD-10-CM | POA: Diagnosis not present

## 2017-11-09 DIAGNOSIS — R69 Illness, unspecified: Secondary | ICD-10-CM | POA: Diagnosis not present

## 2017-11-09 DIAGNOSIS — I4892 Unspecified atrial flutter: Secondary | ICD-10-CM | POA: Diagnosis not present

## 2017-11-09 DIAGNOSIS — E039 Hypothyroidism, unspecified: Secondary | ICD-10-CM | POA: Diagnosis not present

## 2017-11-16 ENCOUNTER — Other Ambulatory Visit: Payer: Self-pay | Admitting: Cardiology

## 2017-12-01 ENCOUNTER — Telehealth: Payer: Self-pay | Admitting: Family Medicine

## 2017-12-01 NOTE — Telephone Encounter (Signed)
Copied from [place] [id number]. Topic: Quick Communication - Rx Refill/Question >> Dec 01, 2017 10:12 AM Neva Seat wrote: tolterodine (DETROL LA) 4 MG 24 hr capsule  Pt needing refills.  [place], [place] [address] Phone: [phone] Fax: [phone]

## 2017-12-01 NOTE — Telephone Encounter (Signed)
Med is prescribed by Orin (Dr. Bernardo Heater). Called pt and left message to call that office for refills.

## 2017-12-01 NOTE — Progress Notes (Signed)
[facility] Consultation      Assessment and Plan:  COPD. -Mild COPD seen on most recent PFT and chest x-ray imaging, without significant symptoms. -No need for treatment at this time. We'll continue to monitor.   Reduced diffusion capacity, currently taking amiodarone, Dilated cardiomyopathy, atrial fibrillation. -The patient's reduction in diffusing capacity could be due to variation, versus COPD, versus amiodarone, though the latter appears to be doubtful at this time. -We'll continue to monitor this with the yearly pulmonary function testing. -The patient is asked to call us back earlier if she noticed a decline in her respiratory capacity. -We will order repeat pulmonary function testing now and again in 1 year.  Insomnia. -Sleep initiation time, notes that her mind keeps running.  Has not responded to melatonin or REM fresh.  She had a reaction to sleeping medication which made her "crazy" uncertain if this was Ambien or not. -We will prescribe ramelteon to see if this is covered.  Meds ordered this encounter  Medications  . ramelteon (ROZEREM) 8 MG tablet    Sig: Take 1 tablet (8 mg total) by mouth at bedtime.    Dispense:  30 tablet    Refill:  5   Orders Placed This Encounter  Procedures  . Pulmonary Function Test [facility] Only  . Pulmonary Function Test [facility] Only   No follow-ups on file.   Date: 12/01/2017  MRN# [id number] [name] 1945/10/18  Referring Physician: Dr. Saunders Revel.   [name] is a 72 y.o. old female seen in consultation for chief complaint of:    Chief Complaint  Patient presents with  . COPD    pt states breathing doing well. She has cough related to allergy. She denies sob.  . Insomnia    PT d/c REM Fresh she couldn't see difference.    HPI:  The patient is a 72 yo female with a history atrial fibrillation, dilated cardiomyopathy, depression, anxiety, sleep disturbance. The patient has a history of dilated cardiomyopathy  and is on amiodarone for rate control, she is currently taking 200 mg daily. She was also being followed for insomnia.  She was last seen due to reduced diffusion capacity on PFT, concerning as pt has been on amiodarone. It was decided to continue to monitor with repeat PFT.   Today she notes that her breathing has been doing well, and she notices no decline in her respiratory status.   She notes that she has trouble sleeping, she does not snore at night but she notes that her mind keeping working while laying in bed, and she notes that she has trouble relaxing. She has tried melatonin and remfresh but did not help.   She is a never smoker, her husband smoked for 40 years about a ppd; he quit recently. 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   **Review of tracings: PFT 09/20/16. Spirometry shows reduced. FEV to FVC ratio. FEV1 is normal. Lung volumes are normal without evidence of hyperinflation or air trapping. DLCO is 68%, mildly reduced. Per report, the previous PFT. 11/18/14, DLCO was "increased". **Review of films, chest x-ray 07/04/16: Mild hyperinflation, otherwise unremarkable lungs.   Medication:    Reviewed.     Allergies:  Patient has no known allergies.  Review of Systems: Gen:  Denies  fever, sweats, chills HEENT: Denies blurred vision, double vision. bleeds, sore throat Cvc:  No dizziness, chest pain. Resp:   Denies cough or sputum production, shortness of breath Gi:  Denies swallowing difficulty, stomach pain. Gu:  Denies bladder incontinence, burning urine Ext:   No Joint pain, stiffness. Skin: No skin rash,  hives  Endoc:  No polyuria, polydipsia. Psych: No depression, insomnia. Other:  All other systems were reviewed with the patient and were negative other that what is mentioned in the HPI.   Physical Examination:   VS: BP 120/68 (BP Location: Left Arm, Cuff Size: Normal)   Pulse (!) 48   Resp 16   Ht 5' 7.5" (1.715 m)   Wt 165 lb (74.8 kg)   SpO2  99%   BMI 25.46 kg/m   General Appearance: No distress  Neuro:without focal findings,  speech normal,  HEENT: PERRLA, EOM intact.   Pulmonary: normal breath sounds, No wheezing.  CardiovascularNormal S1,S2.  No m/r/g.   Abdomen: Benign, Soft, non-tender. Renal:  No costovertebral tenderness  GU:  No performed at this time. Endoc: No evident thyromegaly, no signs of acromegaly. Skin:   warm, no rashes, no ecchymosis  Extremities: normal, no cyanosis, clubbing.  Other findings:    LABORATORY PANEL:   CBC No results for input(s): WBC, HGB, HCT, PLT in the last 168 hours. ------------------------------------------------------------------------------------------------------------------  Chemistries  No results for input(s): NA, K, CL, CO2, GLUCOSE, BUN, CREATININE, CALCIUM, MG, AST, ALT, ALKPHOS, BILITOT in the last 168 hours.  Invalid input(s): GFRCGP ------------------------------------------------------------------------------------------------------------------  Cardiac Enzymes No results for input(s): TROPONINI in the last 168 hours. ------------------------------------------------------------  RADIOLOGY:  No results found.     Thank  you for the consultation and for allowing [facility] to assist in the care of your patient. Our recommendations are noted above.  Please contact us if we can be of further service.   Marda Stalker, MD.  Board Certified in Internal Medicine, Pulmonary Medicine, [place], and Sleep Medicine.  [facility] Office Number: [phone]  [name], M.D.  Merton Border, M.D  12/01/2017

## 2017-12-04 ENCOUNTER — Ambulatory Visit (INDEPENDENT_AMBULATORY_CARE_PROVIDER_SITE_OTHER): Payer: Medicare HMO

## 2017-12-04 ENCOUNTER — Ambulatory Visit: Payer: Medicare HMO | Admitting: Internal Medicine

## 2017-12-04 ENCOUNTER — Telehealth: Payer: Self-pay | Admitting: Urology

## 2017-12-04 ENCOUNTER — Encounter: Payer: Self-pay | Admitting: Internal Medicine

## 2017-12-04 VITALS — BP 120/68 | HR 48 | Resp 16 | Ht 67.5 in | Wt 165.0 lb

## 2017-12-04 DIAGNOSIS — I4891 Unspecified atrial fibrillation: Secondary | ICD-10-CM

## 2017-12-04 DIAGNOSIS — I5042 Chronic combined systolic (congestive) and diastolic (congestive) heart failure: Secondary | ICD-10-CM

## 2017-12-04 DIAGNOSIS — Z7901 Long term (current) use of anticoagulants: Secondary | ICD-10-CM | POA: Diagnosis not present

## 2017-12-04 DIAGNOSIS — J410 Simple chronic bronchitis: Secondary | ICD-10-CM

## 2017-12-04 DIAGNOSIS — Z5181 Encounter for therapeutic drug level monitoring: Secondary | ICD-10-CM | POA: Diagnosis not present

## 2017-12-04 LAB — POCT INR: INR: 6.5

## 2017-12-04 MED ORDER — RAMELTEON 8 MG PO TABS: 8.0000 mg | ORAL_TABLET | Freq: Every day | ORAL | 5 refills | Status: DC

## 2017-12-04 NOTE — Patient Instructions (Signed)
Please skip coumadin today, tomorrow, and Wednesday, then START NEW DOSAGE of 1/2 tablet daily except 1 tablet on Mondays, Wednesdays and Fridays. Please be consistent with your greens intake.  Recheck in 2 weeks.

## 2017-12-04 NOTE — Patient Instructions (Addendum)
Will send your for a pulmonary function test now and again in 1 year. Will see you back in 1 year.  Call us back earlier if you notice trouble breathing.  Will start you on a medication for insomnia.

## 2017-12-04 NOTE — Telephone Encounter (Signed)
Pt came in to office, she went to get refill on Tolterodine 4 mg and pharmacy states she needs prior authorization.  Pt needs Rx send to [facility]

## 2017-12-05 ENCOUNTER — Telehealth: Payer: Self-pay | Admitting: *Deleted

## 2017-12-05 ENCOUNTER — Encounter: Payer: Self-pay | Admitting: *Deleted

## 2017-12-05 DIAGNOSIS — G4700 Insomnia, unspecified: Secondary | ICD-10-CM | POA: Insufficient documentation

## 2017-12-05 MED ORDER — TOLTERODINE TARTRATE ER 4 MG PO CP24: 4.0000 mg | ORAL_CAPSULE | Freq: Every day | ORAL | 6 refills | Status: DC

## 2017-12-05 NOTE — Telephone Encounter (Signed)
PA initiated via cover my meds for Rozerem 8 mg. Patient has tried and failed Mirtazapine 15 mg, Melatonin, and REM Fresh. Pending decision.

## 2017-12-05 NOTE — Telephone Encounter (Signed)
Rozerem 8 mg approved Approval faxed to pharmacy. Nothing further needed.

## 2017-12-05 NOTE — Telephone Encounter (Signed)
Spoke with pt and medication was sent to [facility] per pt request.

## 2017-12-12 ENCOUNTER — Ambulatory Visit: Payer: Medicare HMO | Attending: Internal Medicine

## 2017-12-12 DIAGNOSIS — J410 Simple chronic bronchitis: Secondary | ICD-10-CM | POA: Diagnosis not present

## 2017-12-12 MED ORDER — ALBUTEROL SULFATE (2.5 MG/3ML) 0.083% IN NEBU
2.5000 mg | INHALATION_SOLUTION | Freq: Once | RESPIRATORY_TRACT | Status: AC
  Administered 2017-12-12: 2.5 mg via RESPIRATORY_TRACT
  Filled 2017-12-12: qty 3

## 2017-12-18 ENCOUNTER — Ambulatory Visit (INDEPENDENT_AMBULATORY_CARE_PROVIDER_SITE_OTHER): Payer: Medicare HMO

## 2017-12-18 DIAGNOSIS — Z5181 Encounter for therapeutic drug level monitoring: Secondary | ICD-10-CM

## 2017-12-18 DIAGNOSIS — Z7901 Long term (current) use of anticoagulants: Secondary | ICD-10-CM | POA: Diagnosis not present

## 2017-12-18 DIAGNOSIS — I4891 Unspecified atrial fibrillation: Secondary | ICD-10-CM | POA: Diagnosis not present

## 2017-12-18 LAB — POCT INR: INR: 1.9

## 2017-12-18 NOTE — Patient Instructions (Addendum)
Please take 1 whole tablet tomorrow, then continue dosage of 1/2 tablet daily except 1 tablet on Mondays, Wednesdays and Fridays. Please be consistent with your greens intake - pick a day(s) each week and have your greens on that same day each week.  Recheck in 2 weeks.

## 2018-01-01 ENCOUNTER — Ambulatory Visit (INDEPENDENT_AMBULATORY_CARE_PROVIDER_SITE_OTHER): Payer: Medicare HMO

## 2018-01-01 DIAGNOSIS — Z5181 Encounter for therapeutic drug level monitoring: Secondary | ICD-10-CM

## 2018-01-01 DIAGNOSIS — I4891 Unspecified atrial fibrillation: Secondary | ICD-10-CM | POA: Diagnosis not present

## 2018-01-01 DIAGNOSIS — Z7901 Long term (current) use of anticoagulants: Secondary | ICD-10-CM | POA: Diagnosis not present

## 2018-01-01 LAB — POCT INR: INR: 3.2

## 2018-01-01 NOTE — Patient Instructions (Signed)
Please take 1/2 tablet today, then continue dosage of 1/2 tablet daily except 1 tablet on Mondays, Wednesdays and Fridays. Please be consistent with your greens intake - pick a day(s) each week and have your greens on that same day each week.  Recheck in 3 weeks.

## 2018-01-24 ENCOUNTER — Ambulatory Visit (INDEPENDENT_AMBULATORY_CARE_PROVIDER_SITE_OTHER): Payer: Medicare HMO

## 2018-01-24 DIAGNOSIS — Z5181 Encounter for therapeutic drug level monitoring: Secondary | ICD-10-CM | POA: Diagnosis not present

## 2018-01-24 DIAGNOSIS — I4891 Unspecified atrial fibrillation: Secondary | ICD-10-CM

## 2018-01-24 DIAGNOSIS — Z7901 Long term (current) use of anticoagulants: Secondary | ICD-10-CM | POA: Diagnosis not present

## 2018-01-24 LAB — POCT INR: INR: 1.9 — AB (ref 2.0–3.0)

## 2018-01-24 NOTE — Patient Instructions (Signed)
Please take a whole tablet tomorrow (whole pill 3 days in a row this week), then continue dosage of 1/2 tablet daily except 1 tablet on Mondays, Wednesdays and Fridays. Please be consistent with your greens intake - pick a day(s) each week and have your greens on that same day each week.  Recheck in 3 weeks.

## 2018-02-12 ENCOUNTER — Ambulatory Visit (INDEPENDENT_AMBULATORY_CARE_PROVIDER_SITE_OTHER): Payer: Medicare HMO

## 2018-02-12 DIAGNOSIS — I4891 Unspecified atrial fibrillation: Secondary | ICD-10-CM | POA: Diagnosis not present

## 2018-02-12 DIAGNOSIS — Z7901 Long term (current) use of anticoagulants: Secondary | ICD-10-CM

## 2018-02-12 DIAGNOSIS — Z5181 Encounter for therapeutic drug level monitoring: Secondary | ICD-10-CM | POA: Diagnosis not present

## 2018-02-12 LAB — POCT INR: INR: 2.1 (ref 2.0–3.0)

## 2018-02-12 NOTE — Patient Instructions (Signed)
Please continue dosage of 1/2 tablet daily except 1 tablet on Mondays, Wednesdays and Fridays. Please be consistent with your greens intake - pick a day(s) each week and have your greens on that same day each week.  Recheck in 4 weeks.

## 2018-03-12 ENCOUNTER — Ambulatory Visit: Payer: Medicare HMO

## 2018-03-12 DIAGNOSIS — Z5181 Encounter for therapeutic drug level monitoring: Secondary | ICD-10-CM

## 2018-03-12 DIAGNOSIS — I4891 Unspecified atrial fibrillation: Secondary | ICD-10-CM

## 2018-03-12 DIAGNOSIS — Z7901 Long term (current) use of anticoagulants: Secondary | ICD-10-CM | POA: Diagnosis not present

## 2018-03-12 LAB — POCT INR: INR: 2.2 (ref 2.0–3.0)

## 2018-03-12 NOTE — Patient Instructions (Signed)
Please continue dosage of 1/2 tablet daily except 1 tablet on Mondays, Wednesdays and Fridays. Please be consistent with your greens intake - pick a day(s) each week and have your greens on that same day each week.  Recheck in 5 weeks.

## 2018-03-20 DIAGNOSIS — L821 Other seborrheic keratosis: Secondary | ICD-10-CM | POA: Diagnosis not present

## 2018-03-20 DIAGNOSIS — L573 Poikiloderma of Civatte: Secondary | ICD-10-CM | POA: Diagnosis not present

## 2018-03-20 DIAGNOSIS — Z1283 Encounter for screening for malignant neoplasm of skin: Secondary | ICD-10-CM | POA: Diagnosis not present

## 2018-03-20 DIAGNOSIS — L570 Actinic keratosis: Secondary | ICD-10-CM | POA: Diagnosis not present

## 2018-03-20 DIAGNOSIS — L718 Other rosacea: Secondary | ICD-10-CM | POA: Diagnosis not present

## 2018-03-20 DIAGNOSIS — L578 Other skin changes due to chronic exposure to nonionizing radiation: Secondary | ICD-10-CM | POA: Diagnosis not present

## 2018-03-20 DIAGNOSIS — L918 Other hypertrophic disorders of the skin: Secondary | ICD-10-CM | POA: Diagnosis not present

## 2018-03-29 ENCOUNTER — Ambulatory Visit: Payer: Medicare HMO | Admitting: Family Medicine

## 2018-03-29 ENCOUNTER — Encounter: Payer: Self-pay | Admitting: Family Medicine

## 2018-03-29 VITALS — BP 110/70 | HR 55 | Temp 98.5°F | Ht 67.5 in | Wt 160.5 lb

## 2018-03-29 DIAGNOSIS — I781 Nevus, non-neoplastic: Secondary | ICD-10-CM

## 2018-03-29 DIAGNOSIS — M1120 Other chondrocalcinosis, unspecified site: Secondary | ICD-10-CM

## 2018-03-29 DIAGNOSIS — M170 Bilateral primary osteoarthritis of knee: Secondary | ICD-10-CM

## 2018-03-29 MED ORDER — METHYLPREDNISOLONE ACETATE 40 MG/ML IJ SUSP
80.0000 mg | Freq: Once | INTRAMUSCULAR | Status: AC
  Administered 2018-03-29: 80 mg via INTRA_ARTICULAR

## 2018-03-29 MED ORDER — COLCHICINE 0.6 MG PO TABS: 0.6000 mg | ORAL_TABLET | Freq: Two times a day (BID) | ORAL | 5 refills | Status: DC

## 2018-03-29 NOTE — Patient Instructions (Signed)
Shingrix (shingles vaccine) - yes, this is a good idea.

## 2018-03-29 NOTE — Progress Notes (Signed)
Dr. Frederico Hamman T. Nautica Hotz, MD, [facility] [place], [address] Phone: [phone] Fax: [phone]  03/29/2018  Patient: [name], MRN: [id number], DOB: 1946-05-01, 72 y.o.  Primary Physician:  Owens Loffler, MD   Chief Complaint  Patient presents with  . Knee Pain    Bilateral   Subjective:   [name] is a 72 y.o. very pleasant female patient who presents with the following:  Difficulties standing with walking.  Pain with walking.  Pillow between legs at knees.  Pain in both knees  Standing and knees will swell a lot.   Spider veins in legs.  CPPD? On xr - chondrocalcinosis  b knee inj  Past Medical History, Surgical History, Social History, Family History, Problem List, Medications, and Allergies have been reviewed and updated if relevant.  Patient Active Problem List   Diagnosis Date Noted  . Mild dementia 10/24/2014    Priority: High  . Chronic combined systolic and diastolic CHF, NYHA class 1 ([place]) 09/29/2014    Priority: High  . 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 Generalized anxiety disorder 11/18/2008    Priority: Medium  . Major depressive disorder, recurrent episode, moderate with anxious distress ([place]) 11/18/2008    Priority: Medium  . Insomnia 12/05/2017  . Primary osteoarthritis of both knees 10/12/2016  . Fracture of left pelvis ([place]) 04/29/2015  . PAF (paroxysmal atrial fibrillation) ([place]) 03/04/2015  . On amiodarone therapy 11/14/2014  . Current use of long term anticoagulation 11/14/2014  . Hyperlipidemia LDL goal <100 04/05/2010  . GERD 04/05/2010  . Hypothyroid 11/18/2008  . ASTHMA 11/18/2008  . Mixed incontinence urge and stress 11/18/2008  . NEPHROLITHIASIS, HX OF 11/18/2008    Past Medical History:  Diagnosis Date    . Acne rosacea   . Anxiety   . Asthma   . Atrial fibrillation and flutter ([place]) 09/2014   Revereted from Afib RVR to 2:1 A Flutter -- TEE/ DCCV 2/9; on Amiodarone  . Chronic combined systolic and diastolic CHF, NYHA class 1 ([facility]) -- Systolic dysfunction JJHERDEY[C14.48] 09/2014   Exacerbation 09/2014 2/2 Afib/flutter with RVR - 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 Mod LA dilation, normal PA pressures   . CVA (cerebral infarction)    h/o superior cerebellar infarct  . Depression   . Digoxin toxicity 09/2014  . 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 Diverticulosis   . H/O: rheumatic fever    Childhood  . HYPERLIPIDEMIA 04/05/2010   Qualifier: Diagnosis of  By: Lorelei Pont MD, Frederico Hamman    . Hypothyroid    On replacement therapy; normal TSH 09/2014  . Migraine headache   . Osteopenia   . Paroxysmal atrial fibrillation ([place]) 2005; Recurrent 09/2014   a) 2005: s/p Cox Maze & LAA ligation; b) 09/2014: TEE-cardioversion  . Rheumatic mitral and aortic valve insufficiency 2005   a) s/p MV  Ring repair; with Cox-Maze for Afib; b) Echo 2013: EF 60-65%,no Regional WMA, Gr 2 DD, no Sig MR ;; c) TEE 10/07/2014: Severlely thickened and calcified MV leaflets. Posterior MV leaflet is fixed and immobile. MAC. reduced excursion of the anterior MV leaflet. Mean MV gradient was 79m Hg and MVA calculated at cm2.    . Urinary incontinence     Past Surgical History:  Procedure Laterality Date  . CARDIAC CATHETERIZATION  Jan 2005   Pre-op R&LHC -- Nonobstructive CAD; normal PA pressures  . CARDIOVERSION  06/04/2012   Procedure:  CARDIOVERSION;  Surgeon: JCarlena Bjornstad MD;  [facility];  Service: Cardiovascular;  Laterality: N/A;  . CARDIOVERSION N/A 10/07/2014   Procedure: CARDIOVERSION;  Surgeon: TSueanne Margarita MD;  [place] [facility]  Service: Cardiovascular;  Laterality: N/A;  . COLONOSCOPY WITH PROPOFOL N/A 07/11/2017   Procedure: COLONOSCOPY WITH PROPOFOL;  Surgeon: Tol[place], TBenay Pike MD;  Location: [facility] ENDOSCOPY;  Service: Gastroenterology;  Laterality: N/A;  . LUMBAR DISC SURGERY     x 2 distantly  . MITRAL VALVE REPAIR  2005   with [facility] for [employer] . [place] MYOVIEW LTD  4/7/'16   EF 37% with septal hypokinesis and diffuse hypokinesis. No ischemia or infarction. 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  .Marland KitchenRIGHT HEART CATHETERIZATION N/A 10/03/2014   Procedure: RIGHT HEART CATH;  Surgeon: DJolaine Artist MD;  [facility];  Service: Cardiovascular;  RAP 586mg, RVP 35/2/9 mmHg, PAP 45/12 mmHg, PCWP 16 mmHg; CO/I by Fick: 3.9/2.3; Ao/PA/SVC SaO2%: 97%/63%/65%.  . TEE WITHOUT CARDIOVERSION N/A 10/07/2014   Procedure: TRANSESOPHAGEAL ECHOCARDIOGRAM (TEE);  Surgeon: TrSueanne MargaritaMD;  [facility];  Service: Cardiovascular;  Laterality: N/A;  . THYROIDECTOMY, PARTIAL     partial-no cancer; now on thyroid replacement  . TRANSTHORACIC ECHOCARDIOGRAM  2013; 2/'16 & 5/'16   a) 2013: EF 60-65%, mild MR, Gr 2 DD; b) [facility] - severel global LV dysfunction (systolic & diastolic) - dilated LV & restrictive filling pattern - Gr 3 DD, mod reduced RV function, severely dilated LA - 6.4 cm, mod dilated RA, mod pl effusion, mod MR, mild TR; c) 5/10/'16:  EF 25-30%, mod LV dilation, no RWMA,Gr 3 DD w/ high LAP, MV sewing ring intact w/ Mod MR, Severe LA dilation  . TRANSTHORACIC ECHOCARDIOGRAM  January 2017:   EF improved to 45-50%. Mild diffuse HK. Mild MR. Moderate LA dilation. Normal PA pressures.    Social History    Socioeconomic History  . Marital status: Married    Spouse name: Not on file  . Number of children: 2  . Years of education: Not on file  . Highest education level: Not on file  Occupational History  . Occupation: Retired  [employer] laboratory technician. Financial resource strain: Not on file  . Food insecurity:    Worry: Not on file    Inability: Not on file  . Transportation needs:    Medical: Not on file    Non-medical: Not on file  Tobacco Use  . Smoking status: Never Smoker  . Smokeless tobacco: Never Used  Substance and Sexual Activity  . Alcohol use: No  . Drug use: No  . Sexual activity: Yes  Lifestyle  . Physical activity:    Days per week: Not on file    Minutes per session: Not on file  . Stress: Not on file  Relationships  .  Social connections:    Talks on phone: Not on file    Gets together: Not on file    Attends religious service: Not on file    Active member of club or organization: Not on file    Attends meetings of clubs or organizations: Not on file    Relationship status: Not on file  . Intimate partner violence:    Fear of current or ex partner: Not on file    Emotionally abused: Not on file    Physically abused: Not on file    Forced sexual activity: Not on file  Other Topics Concern  . Not on file  Social History Narrative  . Not on file    Family History  Problem Relation Age of Onset  . Diabetes Mother   . Coronary artery disease Mother   . Kidney disease Mother   . Breast cancer Neg Hx     No Known Allergies  Medication list reviewed and updated in full in [place].  GEN: No fevers, chills. Nontoxic. Primarily MSK c/o today. MSK: Detailed in the HPI GI: tolerating PO intake without difficulty Neuro: No numbness, parasthesias, or tingling associated. Otherwise the pertinent positives of the ROS are noted above.   Objective:   BP 110/70   Pulse (!) 55   Temp 98.5 F (36.9 C) (Oral)   Ht 5' 7.5" (1.715 m)   Wt 160 lb 8 oz  (72.8 kg)   BMI 24.77 kg/m    GEN: WDWN, NAD, Non-toxic, Alert & Oriented x 3 HEENT: Atraumatic, Normocephalic.  Ears and Nose: No external deformity. EXTR: No clubbing/cyanosis/edema NEURO: Normal gait.  PSYCH: Normally interactive. Conversant. Not depressed or anxious appearing.  Calm demeanor.   Knee:  B Gait: Normal heel toe pattern ROM: 0-120 Effusion: neg Echymosis or edema: none Patellar tendon NT Painful PLICA: neg Patellar grind: negative Medial and lateral patellar facet loading: negative medial and lateral joint lines: medial > lateral joint line pain Mcmurray's neg Flexion-pinch pain Varus and valgus stress: stable Lachman: neg Ant and Post drawer: neg Hip abduction, IR, ER: WNL Hip flexion str: 5/5 Hip abd: 5/5 Quad: 5/5 VMO atrophy:No Hamstring concentric and eccentric: 5/5   Radiology: No results found.  Assessment and Plan:   Primary osteoarthritis of both knees - Plan: methylPREDNISolone acetate (DEPO-MEDROL) injection 80 mg, methylPREDNISolone acetate (DEPO-MEDROL) injection 80 mg  Chondrocalcinosis  Spider veins  I reviewed the patient's most recent knee x-rays with her as well as primarily reading the myself, she does have chondrocalcinosis in both of her knees which suggest that she could possibly have CPPD.  Given her level of discomfort and recurring symptoms, to try her on some scheduled colchicine to see if this makes a difference with her quality of life and pain.  We also discussed spider veins  Knee Injection, RIGHT Patient verbally consented to procedure. Risks (including potential rare risk of infection), benefits, and alternatives explained. Sterilely prepped with Chloraprep. Ethyl cholride used for anesthesia. 8 cc Lidocaine 1% mixed with 2 mL Depo-Medrol 40 mg injected using the anteromedial approach without difficulty. No complications with procedure and tolerated well. Patient had decreased pain post-injection.   Knee Injection,  LEFT Patient verbally consented to procedure. Risks (including potential rare risk of infection), benefits, and alternatives explained. Sterilely prepped with Chloraprep. Ethyl cholride used for anesthesia. 8 cc Lidocaine 1% mixed with 2 mL Depo-Medrol 40 mg injected using the anteromedial approach without difficulty. No complications with procedure and tolerated well. Patient  had decreased pain post-injection.    Follow-up: routine f/u  Meds ordered this encounter  Medications  . colchicine 0.6 MG tablet    Sig: Take 1 tablet (0.6 mg total) by mouth 2 (two) times daily.    Dispense:  60 tablet    Refill:  5  . methylPREDNISolone acetate (DEPO-MEDROL) injection 80 mg  . methylPREDNISolone acetate (DEPO-MEDROL) injection 80 mg    Signed,  Sahiba Granholm T. Cimberly Stoffel, MD   Allergies as of 03/29/2018   No Known Allergies     Medication List        Accurate as of 03/29/18 11:59 PM. Always use your most recent med list.          acetaminophen 325 MG tablet Commonly known as:  TYLENOL Take 2 tablets (650 mg total) by mouth every 4 (four) hours as needed for mild pain, fever or headache.   amiodarone 200 MG tablet Commonly known as:  PACERONE TAKE ONE TABLET BY MOUTH EVERY DAY   b complex vitamins tablet Take 1 tablet daily by mouth.   benzonatate 200 MG capsule Commonly known as:  TESSALON Take 1 capsule (200 mg total) by mouth 3 (three) times daily as needed for cough.   CALCIUM + D PO Take 1 capsule daily by mouth.   carvedilol 3.125 MG tablet Commonly known as:  COREG TAKE ONE TABLET BY MOUTH TWICE DAILY   colchicine 0.6 MG tablet Take 1 tablet (0.6 mg total) by mouth 2 (two) times daily.   donepezil 5 MG tablet Commonly known as:  ARICEPT TAKE ONE TABLET BY MOUTH AT BEDTIME   FISH OIL PO Take 1 capsule by mouth daily.   IRON (FERROUS SULFATE) PO Take 1 tablet by mouth daily.   levothyroxine 25 MCG tablet Commonly known as:  SYNTHROID, LEVOTHROID TAKE TWO AND  ONE-HALF TABLETS BY MOUTH DAILY BEFORE BREAKFAST   losartan 25 MG tablet Commonly known as:  COZAAR TAKE ONE TABLET BY MOUTH EVERY DAY   metroNIDAZOLE 0.75 % cream Commonly known as:  METROCREAM Apply topically 2 (two) times daily.   permethrin 5 % cream Commonly known as:  ELIMITE APPLY PEA SIZED AMOUNT TO FACE ONCE A DAY   tolterodine 4 MG 24 hr capsule Commonly known as:  DETROL LA Take 1 capsule (4 mg total) by mouth daily.   warfarin 5 MG tablet Commonly known as:  COUMADIN Take as directed by the anticoagulation clinic. If you are unsure how to take this medication, talk to your nurse or doctor. Original instructions:  TAKE 1/2 TO 1 TABLET EVERY DAY AS DIRECTED BY COUMADIN CLINIC

## 2018-03-30 ENCOUNTER — Encounter: Payer: Self-pay | Admitting: Family Medicine

## 2018-04-16 ENCOUNTER — Ambulatory Visit: Payer: Medicare HMO

## 2018-04-16 DIAGNOSIS — I4891 Unspecified atrial fibrillation: Secondary | ICD-10-CM | POA: Diagnosis not present

## 2018-04-16 DIAGNOSIS — Z5181 Encounter for therapeutic drug level monitoring: Secondary | ICD-10-CM

## 2018-04-16 DIAGNOSIS — Z7901 Long term (current) use of anticoagulants: Secondary | ICD-10-CM | POA: Diagnosis not present

## 2018-04-16 LAB — POCT INR: INR: 1.8 — AB (ref 2.0–3.0)

## 2018-04-16 NOTE — Patient Instructions (Signed)
Please take extra 1/2 dosage tonight, then continue dosage of 1/2 tablet daily except 1 tablet on Mondays, Wednesdays and Fridays. Please be consistent with your greens intake - pick a day(s) each week and have your greens on that same day each week.  Recheck in 5 weeks.

## 2018-04-22 ENCOUNTER — Other Ambulatory Visit: Payer: Self-pay | Admitting: Cardiovascular Disease

## 2018-05-01 DIAGNOSIS — R69 Illness, unspecified: Secondary | ICD-10-CM | POA: Diagnosis not present

## 2018-05-17 ENCOUNTER — Telehealth: Payer: Self-pay

## 2018-05-17 DIAGNOSIS — E038 Other specified hypothyroidism: Secondary | ICD-10-CM

## 2018-05-17 DIAGNOSIS — E559 Vitamin D deficiency, unspecified: Secondary | ICD-10-CM

## 2018-05-17 DIAGNOSIS — E785 Hyperlipidemia, unspecified: Secondary | ICD-10-CM

## 2018-05-17 NOTE — Telephone Encounter (Signed)
CPE labs ordered per PCP instructions.     [name], Frederico Hamman, MD  Candis Musa R, LPN        FLP, Q11.9 hyperlipidemia  Cbc with diff, HFP, BMET, TSH: R53.83 long-term medication usage  TSH, Free T3, Free T4: E03.9 hypothyroidism  Vit D: E55.9 low vitamin d levels   Previous Messages    ----- Message -----  From: Eustace Pen, LPN  Sent: 12/14/4079  4:44 PM EDT  To: Owens Loffler, MD  Subject: Labs 9/20                     Lab orders needed. Thank you.   Insurance:  [employer]

## 2018-05-18 ENCOUNTER — Ambulatory Visit: Payer: Medicare HMO

## 2018-05-18 ENCOUNTER — Ambulatory Visit (INDEPENDENT_AMBULATORY_CARE_PROVIDER_SITE_OTHER): Payer: Medicare HMO

## 2018-05-18 VITALS — BP 126/80 | HR 55 | Temp 97.6°F | Ht 67.5 in | Wt 155.8 lb

## 2018-05-18 DIAGNOSIS — E559 Vitamin D deficiency, unspecified: Secondary | ICD-10-CM | POA: Diagnosis not present

## 2018-05-18 DIAGNOSIS — E038 Other specified hypothyroidism: Secondary | ICD-10-CM | POA: Diagnosis not present

## 2018-05-18 DIAGNOSIS — M109 Gout, unspecified: Secondary | ICD-10-CM | POA: Diagnosis not present

## 2018-05-18 DIAGNOSIS — E785 Hyperlipidemia, unspecified: Secondary | ICD-10-CM | POA: Diagnosis not present

## 2018-05-18 DIAGNOSIS — Z0000 Encounter for general adult medical examination without abnormal findings: Secondary | ICD-10-CM

## 2018-05-18 LAB — BASIC METABOLIC PANEL
BUN: 17 mg/dL (ref 6–23)
CALCIUM: 9.5 mg/dL (ref 8.4–10.5)
CO2: 28 meq/L (ref 19–32)
Chloride: 104 mEq/L (ref 96–112)
Creatinine, Ser: 0.94 mg/dL (ref 0.40–1.20)
GFR: 61.93 mL/min (ref >60.00)
GLUCOSE: 96 mg/dL (ref 70–99)
Potassium: 4.6 mEq/L (ref 3.5–5.1)
Sodium: 140 mEq/L (ref 135–145)

## 2018-05-18 LAB — HEPATIC FUNCTION PANEL
ALT: 21 U/L (ref 0–35)
AST: 21 U/L (ref 0–37)
Albumin: 4.4 g/dL (ref 3.5–5.2)
Alkaline Phosphatase: 69 U/L (ref 39–117)
Bilirubin, Direct: 0.1 mg/dL (ref 0.0–0.3)
TOTAL PROTEIN: 7 g/dL (ref 6.0–8.3)
Total Bilirubin: 0.7 mg/dL (ref 0.2–1.2)

## 2018-05-18 LAB — LIPID PANEL
CHOL/HDL RATIO: 3
Cholesterol: 189 mg/dL (ref 0–200)
HDL: 65.9 mg/dL (ref >39.00)
LDL Cholesterol: 102 mg/dL — ABNORMAL HIGH (ref 0–99)
NONHDL: 122.73
Triglycerides: 103 mg/dL (ref 0.0–149.0)
VLDL: 20.6 mg/dL (ref 0.0–40.0)

## 2018-05-18 LAB — T3, FREE: T3, Free: 7 pg/mL — ABNORMAL HIGH (ref 2.3–4.2)

## 2018-05-18 LAB — CBC WITH DIFFERENTIAL/PLATELET
BASOS PCT: 1.2 % (ref 0.0–3.0)
Basophils Absolute: 0 10*3/uL (ref 0.0–0.1)
EOS ABS: 0.1 10*3/uL (ref 0.0–0.7)
EOS PCT: 2.5 % (ref 0.0–5.0)
HEMATOCRIT: 40.8 % (ref 36.0–46.0)
HEMOGLOBIN: 13.6 g/dL (ref 12.0–15.0)
LYMPHS PCT: 29.4 % (ref 12.0–46.0)
Lymphs Abs: 1 10*3/uL (ref 0.7–4.0)
MCHC: 33.4 g/dL (ref 30.0–36.0)
MCV: 103.9 fl — ABNORMAL HIGH (ref 78.0–100.0)
MONO ABS: 0.3 10*3/uL (ref 0.1–1.0)
Monocytes Relative: 8.9 % (ref 3.0–12.0)
NEUTROS PCT: 58 % (ref 43.0–77.0)
Neutro Abs: 2 10*3/uL (ref 1.4–7.7)
Platelets: 196 10*3/uL (ref 150.0–400.0)
RBC: 3.93 Mil/uL (ref 3.87–5.11)
RDW: 14.2 % (ref 11.5–15.5)
WBC: 3.4 10*3/uL — AB (ref 4.0–10.5)

## 2018-05-18 LAB — TSH: TSH: 6.67 u[IU]/mL — ABNORMAL HIGH (ref 0.35–4.50)

## 2018-05-18 LAB — VITAMIN D 25 HYDROXY (VIT D DEFICIENCY, FRACTURES): VITD: 38.5 ng/mL (ref 30.00–100.00)

## 2018-05-18 LAB — T4, FREE: FREE T4: 1.86 ng/dL — AB (ref 0.60–1.60)

## 2018-05-18 LAB — URIC ACID: Uric Acid, Serum: 3.2 mg/dL (ref 2.4–7.0)

## 2018-05-18 NOTE — Patient Instructions (Signed)
[name] , Thank you for taking time to come for your Medicare Wellness Visit. I appreciate your ongoing commitment to your health goals. Please review the following plan we discussed and let me know if I can assist you in the future.   These are the goals we discussed: Goals    . Increase water intake     Starting 05/18/2018, I will continue to drink at least 6-8 glasses of water daily.        This is a list of the screening recommended for you and due dates:  Health Maintenance  Topic Date Due  . Tetanus Vaccine  05/17/2027*  . Mammogram  07/11/2019  . Colon Cancer Screening  07/12/2027  . Flu Shot  Completed  . DEXA scan (bone density measurement)  Completed  .  Hepatitis C: One time screening is recommended by Center for Disease Control  (CDC) for  adults born from [birth year] through 1966.   Completed  . Pneumonia vaccines  Completed  *Topic was postponed. The date shown is not the original due date.   Preventive Care for Adults  A healthy lifestyle and preventive care can promote health and wellness. Preventive health guidelines for adults include the following key practices.  . A routine yearly physical is a good way to check with your health care provider about your health and preventive screening. It is a chance to share any concerns and updates on your health and to receive a thorough exam.  . Visit your dentist for a routine exam and preventive care every 6 months. Brush your teeth twice a day and floss once a day. Good oral hygiene prevents tooth decay and gum disease.  . The frequency of eye exams is based on your age, health, family medical history, use  of contact lenses, and other factors. Follow your health care provider's recommendations for frequency of eye exams.  . Eat a healthy diet. Foods like vegetables, fruits, whole grains, low-fat dairy products, and lean protein foods contain the nutrients you need without too many calories. Decrease your intake of foods high  in solid fats, added sugars, and salt. Eat the right amount of calories for you. Get information about a proper diet from your health care provider, if necessary.  . Regular physical exercise is one of the most important things you can do for your health. Most adults should get at least 150 minutes of moderate-intensity exercise (any activity that increases your heart rate and causes you to sweat) each week. In addition, most adults need muscle-strengthening exercises on 2 or more days a week.  Silver Sneakers may be a benefit available to you. To determine eligibility, you may visit the website: www.silversneakers.com or contact program at 306 710 8078 Mon-Fri between 8AM-8PM.   . Maintain a healthy weight. The body mass index (BMI) is a screening tool to identify possible weight problems. It provides an estimate of body fat based on height and weight. Your health care provider can find your BMI and can help you achieve or maintain a healthy weight.   For adults 20 years and older: ? A BMI below 18.5 is considered underweight. ? A BMI of 18.5 to 24.9 is normal. ? A BMI of 25 to 29.9 is considered overweight. ? A BMI of 30 and above is considered obese.   . Maintain normal blood lipids and cholesterol levels by exercising and minimizing your intake of saturated fat. Eat a balanced diet with plenty of fruit and vegetables. Blood tests for lipids  and cholesterol should begin at age 17 and be repeated every 5 years. If your lipid or cholesterol levels are high, you are over 50, or you are at high risk for heart disease, you may need your cholesterol levels checked more frequently. Ongoing high lipid and cholesterol levels should be treated with medicines if diet and exercise are not working.  . If you smoke, find out from your health care provider how to quit. If you do not use tobacco, please do not start.  . If you choose to drink alcohol, please do not consume more than 2 drinks per day. One  drink is considered to be 12 ounces (355 mL) of beer, 5 ounces (148 mL) of wine, or 1.5 ounces (44 mL) of liquor.  . If you are 48-64 years old, ask your health care provider if you should take aspirin to prevent strokes.  . Use sunscreen. Apply sunscreen liberally and repeatedly throughout the day. You should seek shade when your shadow is shorter than you. Protect yourself by wearing long sleeves, pants, a wide-brimmed hat, and sunglasses year round, whenever you are outdoors.  . Once a month, do a whole body skin exam, using a mirror to look at the skin on your back. Tell your health care provider of new moles, moles that have irregular borders, moles that are larger than a pencil eraser, or moles that have changed in shape or color.

## 2018-05-18 NOTE — Progress Notes (Signed)
Subjective:   [name] is a 72 y.o. female who presents for Medicare Annual (Subsequent) preventive examination.  Review of Systems:  N/A Cardiac Risk Factors include: advanced age (>58mn, >>63women);dyslipidemia     Objective:     Vitals: BP 126/80 (BP Location: Right Arm, Patient Position: Sitting, Cuff Size: Normal)   Pulse (!) 55   Temp 97.6 F (36.4 C) (Oral)   Ht 5' 7.5" (1.715 m) Comment: no shoes  Wt 155 lb 12 oz (70.6 kg)   SpO2 98%   BMI 24.03 kg/m   Body mass index is 24.03 kg/m.  Advanced Directives 05/18/2018 07/11/2017 05/16/2017 03/04/2016 10/03/2014 06/04/2012  Does Patient Have a Medical Advance Directive? _0  Patient does not have advance directive  Type of AScientist, forensicPower of APaoliLiving will Out of facility DNR (pink MOST or yellow form) HSt. Regis ParkLiving will HCuthbertLiving will HBarrera-  Does patient want to make changes to medical advance directive? - - - No - Patient declined No - Patient declined -  Copy of HMendonin Chart? No - copy requested - No - copy requested No - copy requested No - copy requested -    Tobacco Social History   Tobacco Use  Smoking Status Never Smoker  Smokeless Tobacco Never Used     Counseling given: No   Clinical Intake:  Pre-visit preparation completed: Yes  Pain : No/denies pain Pain Score: 0-No pain     Nutritional Status: BMI 25 -29 Overweight Nutritional Risks: None Diabetes: No  How often do you need to have someone help you when you read instructions, pamphlets, or other written materials from your doctor or pharmacy?: 1 - Never What is the last grade level you completed in school?: 12th grade + 1 yr college  Interpreter Needed?: No  Comments: pt lives with spouse Information entered by :: LPinson, LPN  Past Medical History:  Diagnosis Date  . Acne rosacea   . Anxiety   .  Asthma   . Atrial fibrillation and flutter ([place] 09/2014   Revereted from Afib RVR to 2:1 A Flutter -- TEE/ DCCV 2/9; on Amiodarone  . 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 Mod LA dilation, normal PA pressures   . CVA (cerebral infarction)    h/o superior cerebellar infarct  . Depression   . Digoxin toxicity 09/2014  . 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 Diverticulosis   . H/O: rheumatic fever    Childhood  . HYPERLIPIDEMIA 04/05/2010   Qualifier: Diagnosis of  By: CLorelei PontMD, SFrederico Hamman   . Hypothyroid    On replacement therapy; normal TSH 09/2014  . Migraine headache   . Osteopenia   . Paroxysmal atrial fibrillation ([place] 2005; Recurrent 09/2014   a) 2005: s/p Cox Maze & LAA ligation; b) 09/2014: TEE-cardioversion  . Rheumatic mitral and  aortic valve insufficiency 2005   a) s/p MV Ring repair; with Cox-Maze for Afib; b) Echo 2013: EF 60-65%,no Regional WMA, Gr 2 DD, no Sig MR ;; c) TEE 10/07/2014: Severlely thickened and calcified MV leaflets. Posterior MV leaflet is fixed and immobile. MAC. reduced excursion of the anterior MV leaflet. Mean MV gradient was 109m Hg and MVA calculated at cm2.    . Urinary incontinence    Past Surgical History:  Procedure Laterality Date  . CARDIAC CATHETERIZATION  Jan 2005   Pre-op R&LHC -- Nonobstructive CAD; normal PA pressures  . CARDIOVERSION  06/04/2012   Procedure: CARDIOVERSION;  Surgeon: JCarlena Bjornstad MD;  [facility];  Service: Cardiovascular;  Laterality: N/A;  . CARDIOVERSION N/A 10/07/2014   Procedure: CARDIOVERSION;  Surgeon: TSueanne Margarita MD;  [place]  Service: Cardiovascular;  Laterality: N/A;  . COLONOSCOPY WITH PROPOFOL N/A 07/11/2017   Procedure: COLONOSCOPY WITH PROPOFOL;  Surgeon: Tol[place], TBenay Pike MD;  Location: [facility] ENDOSCOPY;  Service: Gastroenterology;  Laterality: N/A;  . LUMBAR DISC SURGERY     x 2 distantly  . MITRAL VALVE REPAIR  2005   with [facility] Maze for [employer] . NM MYOVIEW LTD  4/7/'16   EF 37% with septal hypokinesis and diffuse hypokinesis. No ischemia or infarction. 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  .Marland KitchenRIGHT HEART CATHETERIZATION N/A 10/03/2014   Procedure: RIGHT HEART CATH;  Surgeon: DJolaine Artist MD;  [facility];  Service: Cardiovascular;  RAP 551mg, RVP 35/2/9 mmHg, PAP 45/12 mmHg, PCWP 16 mmHg; CO/I by Fick: 3.9/2.3; Ao/PA/SVC SaO2%: 97%/63%/65%.  . TEE WITHOUT CARDIOVERSION N/A 10/07/2014   Procedure: TRANSESOPHAGEAL ECHOCARDIOGRAM (TEE);  Surgeon: TrSueanne MargaritaMD;  [facility];  Service: Cardiovascular;  Laterality: N/A;  . THYROIDECTOMY, PARTIAL     partial-no cancer; now on thyroid replacement  . TRANSTHORACIC ECHOCARDIOGRAM  2013; 2/'16 & 5/'16   a) 2013: EF 60-65%, mild MR, Gr 2 DD; b) [place] - severel global LV dysfunction (systolic & diastolic) - dilated LV & restrictive filling pattern - Gr 3 DD, mod reduced RV function, severely dilated LA - 6.4 cm, mod dilated RA, mod pl effusion, mod MR, mild TR; c) 5/10/'16:  EF 25-30%, mod LV dilation, no RWMA,Gr 3 DD w/ high LAP, MV sewing ring intact w/ Mod MR, Severe LA dilation  . TRANSTHORACIC ECHOCARDIOGRAM  January 2017:   EF improved to 45-50%. Mild diffuse HK. Mild MR. Moderate LA dilation. Normal PA pressures.   Family History  Problem Relation Age of Onset  .  Diabetes Mother   . Coronary artery disease Mother   . Kidney disease Mother   . Breast cancer Neg Hx    Social History   Socioeconomic History  . Marital status: Married    Spouse name: Not on file  . Number of children: 2  . Years of education: Not on file  . Highest education level: Not on file  Occupational History  . Occupation: Retired  [employer] laboratory technician. Financial resource strain: Not on file  . Food insecurity:    Worry: Not on file    Inability: Not on file  . Transportation needs:    Medical: Not on file    Non-medical: Not on file  Tobacco Use  . Smoking status: Never Smoker  . Smokeless tobacco: Never Used  Substance and Sexual Activity  . Alcohol use: Yes  Comment: wine occasionally  . Drug use: No  . Sexual activity: Yes  Lifestyle  . Physical activity:    Days per week: Not on file    Minutes per session: Not on file  . Stress: Not on file  Relationships  . Social connections:    Talks on phone: Not on file    Gets together: Not on file    Attends religious service: Not on file    Active member of club or organization: Not on file    Attends meetings of clubs or organizations: Not on file    Relationship status: Not on file  Other Topics Concern  . Not on file  Social History Narrative  . Not on file    Outpatient Encounter Medications as of 05/18/2018  Medication Sig  . acetaminophen (TYLENOL) 325 MG tablet Take 2 tablets (650 mg total) by mouth every 4 (four) hours as needed for mild pain, fever or headache.  Marland Kitchen amiodarone (PACERONE) 200 MG tablet TAKE ONE TABLET BY MOUTH EVERY DAY  . b complex vitamins tablet Take 1 tablet daily by mouth.  . benzonatate (TESSALON) 200 MG capsule Take 1 capsule (200 mg total) by mouth 3 (three) times daily as needed for cough.  . Calcium Citrate-Vitamin D (CALCIUM + D PO) Take 1 capsule daily by mouth.   . carvedilol (COREG) 3.125 MG tablet TAKE ONE TABLET BY MOUTH TWICE DAILY  . colchicine 0.6 MG tablet Take 1  tablet (0.6 mg total) by mouth 2 (two) times daily.  Marland Kitchen donepezil (ARICEPT) 5 MG tablet TAKE ONE TABLET BY MOUTH AT BEDTIME  . IRON, FERROUS SULFATE, PO Take 1 tablet by mouth daily.  Marland Kitchen levothyroxine (SYNTHROID, LEVOTHROID) 25 MCG tablet TAKE TWO AND ONE-HALF TABLETS BY MOUTH DAILY BEFORE BREAKFAST  . losartan (COZAAR) 25 MG tablet TAKE ONE TABLET BY MOUTH EVERY DAY  . metroNIDAZOLE (METROCREAM) 0.75 % cream Apply topically 2 (two) times daily.  . Omega-3 Fatty Acids (FISH OIL PO) Take 1 capsule by mouth daily.  . permethrin (ELIMITE) 5 % cream APPLY PEA SIZED AMOUNT TO FACE ONCE A DAY  . tolterodine (DETROL LA) 4 MG 24 hr capsule Take 1 capsule (4 mg total) by mouth daily.  Marland Kitchen warfarin (COUMADIN) 5 MG tablet TAKE 1/2 TO 1 TABLET EVERY DAY AS DIRECTED BY COUMADIN CLINIC   No facility-administered encounter medications on file as of 05/18/2018.     Activities of Daily Living In your present state of health, do you have any difficulty performing the following activities: 05/18/2018  Hearing? Y  Comment wears hearing aids  Vision? N  Difficulty concentrating or making decisions? N  Walking or climbing stairs? Y  Comment knees causes concerns with stairs  Dressing or bathing? N  Doing errands, shopping? N  Preparing Food and eating ? N  Using the Toilet? N  In the past six months, have you accidently leaked urine? Y  Do you have problems with loss of bowel control? N  Managing your Medications? N  Managing your Finances? N  Housekeeping or managing your Housekeeping? N  Some recent data might be hidden    Patient Care Team: Owens Loffler, MD as PCP - General (Family Medicine)    Assessment:   This is a routine wellness examination for [place].  Hearing Screening Comments: Bilateral hearing aids Vision Screening Comments: Vision exam in 2018 @ [facility]  Exercise Activities and Dietary recommendations Current Exercise Habits: The patient does not participate in regular  exercise at present(gardening, yard work), Exercise limited by: orthopedic condition(s)  Goals    . Increase water intake     Starting 05/18/2018, I will continue to drink at least 6-8 glasses of water daily.        Fall Risk Fall Risk  05/18/2018 05/16/2017 03/04/2016  Falls in the past year? No No Yes  Number falls in past yr: - - 1  Injury with Fall? - - Yes  Follow up - - Falls evaluation completed   Depression Screen PHQ 2/9 Scores 05/18/2018 05/16/2017 03/04/2016  PHQ - 2 Score 0 0 0  PHQ- 9 Score 0 2 -     Cognitive Function MMSE - Mini Mental State Exam 05/18/2018 05/16/2017 03/04/2016  Orientation to time _0 Orientation to Place _1 Registration _2 Attention/ Calculation 0 0 0  Recall _3 Recall-comments - - pt was unable to recall 1 of 3 words  Language- name 2 objects 0 0 0  Language- repeat _4 Language- follow 3 step command _5 Language- read & follow direction 0 0 0  Write a sentence 0 0 0  Copy design 0 0 0  Total score _6 PLEASE NOTE: A Mini-Cog screen was completed. Maximum score is 20. A value of 0 denotes this part of Folstein MMSE was not completed or the patient failed this part of the Mini-Cog screening.   Mini-Cog Screening Orientation to Time - Max 5 pts Orientation to Place - Max 5 pts Registration - Max 3 pts Recall - Max 3 pts Language Repeat - Max 1 pts Language Follow 3 Step Command - Max 3 pts     Immunization History  Administered Date(s) Administered  . Influenza Split 05/11/2011  . Influenza Whole 06/30/2010  . Influenza, High Dose Seasonal PF 06/27/2015, 05/01/2018  . Influenza,inj,Quad PF,6+ Mos 07/29/2013, 05/27/2016, 05/16/2017  . Pneumococcal Conjugate-13 08/26/2015  . Pneumococcal Polysaccharide-23 04/05/2010  . Zoster Recombinat (Shingrix) 03/29/2018    Screening Tests Health Maintenance  Topic Date Due  . TETANUS/TDAP  05/17/2027 (Originally 09/22/1964)  . MAMMOGRAM  07/11/2019  . COLONOSCOPY   07/12/2027  . INFLUENZA VACCINE  Completed  . DEXA SCAN  Completed  . Hepatitis C Screening  Completed  . PNA vac Low Risk Adult  Completed       Plan:   I have personally reviewed, addressed, and noted the following in the patient's chart:  A. Medical and social history B. Use of alcohol, tobacco or illicit drugs  C. Current medications and supplements D. Functional ability and status E.  Nutritional status F.  Physical activity G. Advance directives H. List of other physicians I.  Hospitalizations, surgeries, and ER visits in previous 12 months J.  [place] to include hearing, vision, cognitive, depression L. Referrals and appointments - none  In addition, I have reviewed and discussed with patient certain preventive protocols, quality metrics, and best practice recommendations. A written personalized care plan for preventive services as well as general preventive health recommendations were provided to patient.  See attached scanned questionnaire for additional information.   Signed,   Lindell Noe, MHA, BS, LPN Health Coach

## 2018-05-20 NOTE — Progress Notes (Signed)
PCP notes:   Health maintenance:  No gaps identified.  Abnormal screenings:   None  Patient concerns:   None  Nurse concerns:  None  Next PCP appt:   05/28/18 @ [id number]

## 2018-05-21 ENCOUNTER — Ambulatory Visit: Payer: Medicare HMO

## 2018-05-21 DIAGNOSIS — I4891 Unspecified atrial fibrillation: Secondary | ICD-10-CM | POA: Diagnosis not present

## 2018-05-21 DIAGNOSIS — Z7901 Long term (current) use of anticoagulants: Secondary | ICD-10-CM

## 2018-05-21 DIAGNOSIS — Z5181 Encounter for therapeutic drug level monitoring: Secondary | ICD-10-CM | POA: Diagnosis not present

## 2018-05-21 LAB — POCT INR: INR: 2.8 (ref 2.0–3.0)

## 2018-05-21 NOTE — Patient Instructions (Signed)
Please continue dosage of 1/2 tablet daily except 1 tablet on Mondays, Wednesdays and Fridays. Please be consistent with your greens intake - pick a day(s) each week and have your greens on that same day each week.  Recheck in 6 weeks.  

## 2018-05-22 NOTE — Progress Notes (Signed)
I reviewed health advisor's note, was available for consultation, and agree with documentation and plan.  

## 2018-05-28 ENCOUNTER — Ambulatory Visit (INDEPENDENT_AMBULATORY_CARE_PROVIDER_SITE_OTHER): Payer: Medicare HMO | Admitting: Family Medicine

## 2018-05-28 ENCOUNTER — Encounter: Payer: Self-pay | Admitting: Family Medicine

## 2018-05-28 VITALS — BP 107/52 | HR 55 | Temp 97.7°F | Ht 67.5 in | Wt 157.5 lb

## 2018-05-28 DIAGNOSIS — Z0000 Encounter for general adult medical examination without abnormal findings: Secondary | ICD-10-CM | POA: Diagnosis not present

## 2018-05-28 DIAGNOSIS — E039 Hypothyroidism, unspecified: Secondary | ICD-10-CM | POA: Diagnosis not present

## 2018-05-28 MED ORDER — COLCHICINE 0.6 MG PO TABS: 0.3000 mg | ORAL_TABLET | Freq: Every day | ORAL | 5 refills | Status: DC

## 2018-05-28 MED ORDER — LEVOTHYROXINE SODIUM 25 MCG PO TABS: 50.0000 ug | ORAL_TABLET | Freq: Every day | ORAL | 3 refills | Status: DC

## 2018-05-28 NOTE — Patient Instructions (Addendum)
Stop the Detrol LA (Tolterodine tartrate) and see if this clears up your blurry vision.   If it does not help after about a week, then it is probably not from this medicine and more likely from an eye change.   Decrease your Levothyroxine dosage to only 2 tablets in the morning.  We will need to recheck your thyroid levels in about 2 months.

## 2018-05-28 NOTE — Progress Notes (Signed)
Dr. Frederico Hamman T. Taite Baldassari, MD, [facility] [place], [address] Phone: [phone] Fax: [phone]  05/28/2018  Patient: [name], MRN: [id number], DOB: [birth year], 72 y.o.  Primary Physician:  Owens Loffler, MD   Chief Complaint  Patient presents with  . Annual Exam    Part 2   Subjective:   [name] is a 72 y.o. pleasant patient who presents with the following:  Health Maintenance Summary Reviewed and updated, unless pt declines services.  Tobacco History Reviewed. Non-smoker Alcohol: No concerns, no excessive use Exercise Habits: Some activity, rec at least 30 mins 5 times a week STD concerns: none Drug Use: None Birth control method: Menses regular: n/a Lumps or breast concerns: no Breast Cancer Family History: no  Colchicine - could not tolerate Prob CPPD  Knees are hurting  Painting a lot  Health Maintenance  Topic Date Due  . TETANUS/TDAP  05/17/2027 (Originally 09/22/1964)  . MAMMOGRAM  07/11/2019  . COLONOSCOPY  07/12/2027  . INFLUENZA VACCINE  Completed  . DEXA SCAN  Completed  . Hepatitis C Screening  Completed  . PNA vac Low Risk Adult  Completed    Immunization History  Administered Date(s) Administered  . Influenza Split 05/11/2011  . Influenza Whole 06/30/2010  . Influenza, High Dose Seasonal PF 06/27/2015, 05/01/2018  . Influenza,inj,Quad PF,6+ Mos 07/29/2013, 05/27/2016, 05/16/2017  . Pneumococcal Conjugate-13 08/26/2015  . Pneumococcal Polysaccharide-23 04/05/2010  . Zoster Recombinat (Shingrix) 03/29/2018   Patient Active Problem List   Diagnosis Date Noted  . Mild dementia 10/24/2014    Priority: High  . Chronic combined systolic and diastolic CHF, NYHA class 1 ([place]) 09/29/2014    Priority: High  . 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 Generalized anxiety disorder 11/18/2008    Priority: Medium  . Major depressive disorder, recurrent episode, moderate with anxious distress ([place]) 11/18/2008    Priority: Medium  . Insomnia 12/05/2017  . Primary osteoarthritis of both knees 10/12/2016  . Fracture of left pelvis ([place]) 04/29/2015  . PAF (paroxysmal atrial fibrillation) ([place]) 03/04/2015  . On amiodarone therapy 11/14/2014  . Current use of long term anticoagulation 11/14/2014  . Hyperlipidemia LDL goal <100 04/05/2010  . GERD 04/05/2010  . Hypothyroid 11/18/2008  . ASTHMA 11/18/2008  . Mixed incontinence urge and stress 11/18/2008  . NEPHROLITHIASIS, HX OF 11/18/2008   Past Medical History:  Diagnosis Date  . Acne rosacea   . Anxiety   . Asthma   . Atrial fibrillation and flutter ([place]) 09/2014   Revereted from Afib RVR to 2:1 A Flutter -- TEE/ DCCV 2/9; on Amiodarone  . Chronic combined systolic and diastolic CHF, NYHA class 1 ([facility]) -- Systolic dysfunction [id number][I26.41] 09/2014   Exacerbation 09/2014 2/2 Afib/flutter with RVR - 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 Mod LA dilation, normal PA pressures   . CVA (cerebral infarction)    h/o superior cerebellar infarct  . Depression   . Digoxin toxicity 09/2014  . 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 Diverticulosis   . H/O: rheumatic fever    Childhood  . HYPERLIPIDEMIA 04/05/2010   Qualifier: Diagnosis of  By: Lorelei Pont MD, Frederico Hamman    . Hypothyroid    On replacement therapy; normal TSH 09/2014  . Migraine headache   . Osteopenia   . Paroxysmal  atrial fibrillation ([place]) 2005; Recurrent 09/2014   a) 2005: s/p Cox Maze & LAA ligation; b) 09/2014: TEE-cardioversion  . Rheumatic mitral and aortic valve insufficiency 2005   a) s/p MV Ring repair; with Cox-Maze for Afib; b) Echo 2013: EF 60-65%,no Regional WMA, Gr 2 DD, no Sig MR ;; c) TEE 10/07/2014: Severlely thickened and calcified MV leaflets. Posterior MV leaflet is fixed and immobile. MAC. reduced excursion of the anterior MV leaflet. Mean MV gradient was 44m Hg and MVA calculated at cm2.    . Urinary incontinence    Past Surgical History:  Procedure Laterality Date  . CARDIAC CATHETERIZATION  Jan 2005   Pre-op R&LHC -- Nonobstructive CAD; normal PA pressures  . CARDIOVERSION  06/04/2012   Procedure: CARDIOVERSION;  Surgeon: JCarlena Bjornstad MD;  [facility];  Service: Cardiovascular;  Laterality: N/A;  . CARDIOVERSION N/A 10/07/2014   Procedure: CARDIOVERSION;  Surgeon: TSueanne Margarita MD;  [facility]  Service: Cardiovascular;  Laterality: N/A;  . COLONOSCOPY WITH PROPOFOL N/A 07/11/2017   Procedure: COLONOSCOPY WITH PROPOFOL;  Surgeon: Toledo, TBenay Pike MD;  Location: [facility] ENDOSCOPY;  Service: Gastroenterology;  Laterality: N/A;  . LUMBAR DISC SURGERY     x 2 distantly  . MITRAL VALVE REPAIR  2005   with [facility] for [employer] . NM MYOVIEW LTD  4/7/'16   EF 37% with septal hypokinesis and diffuse hypokinesis. No ischemia or infarction. 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  .Marland KitchenRIGHT HEART CATHETERIZATION N/A 10/03/2014   Procedure: RIGHT HEART CATH;  Surgeon: DJolaine Artist MD;  [facility];  Service: Cardiovascular;  RAP 564mg, RVP 35/2/9 mmHg, PAP 45/12 mmHg, PCWP 16 mmHg; CO/I by Fick: 3.9/2.3; Ao/PA/SVC SaO2%: 97%/63%/65%.  . TEE WITHOUT CARDIOVERSION N/A 10/07/2014   Procedure: TRANSESOPHAGEAL ECHOCARDIOGRAM (TEE);  Surgeon: TrSueanne MargaritaMD;  [facility];  Service:  Cardiovascular;  Laterality: N/A;  . THYROIDECTOMY, PARTIAL     partial-no cancer; now on thyroid replacement  . TRANSTHORACIC ECHOCARDIOGRAM  2013; 2/'16 & 5/'16   a) 2013: EF 60-65%, mild MR, Gr 2 DD; b[facility] - severel global LV dysfunction (systolic & diastolic) - dilated LV & restrictive filling pattern - Gr 3 DD, mod reduced RV function, severely dilated LA - 6.4 cm, mod dilated RA, mod pl effusion, mod MR, mild TR; c) 5/10/'16:  EF 25-30%, mod LV dilation, no RWMA,Gr 3 DD w/ high LAP, MV sewing ring intact w/ Mod MR, Severe LA dilation  . TRANSTHORACIC ECHOCARDIOGRAM  January 2017:   EF improved to 45-50%. Mild diffuse HK. Mild MR. Moderate LA dilation. Normal PA pressures.   Social History   Socioeconomic History  . Marital status: Married    Spouse name: Not on file  . Number of children: 2  . Years of education: Not on file  . Highest education level: Not on file  Occupational History  . Occupation: Retired  [employer] laboratory technician. Financial resource strain: Not  on file  . Food insecurity:    Worry: Not on file    Inability: Not on file  . Transportation needs:    Medical: Not on file    Non-medical: Not on file  Tobacco Use  . Smoking status: Never Smoker  . Smokeless tobacco: Never Used  Substance and Sexual Activity  . Alcohol use: Yes    Comment: wine occasionally  . Drug use: No  . Sexual activity: Yes  Lifestyle  . Physical activity:    Days per week: Not on file    Minutes per session: Not on file  . Stress: Not on file  Relationships  . Social connections:    Talks on phone: Not on file    Gets together: Not on file    Attends religious service: Not on file    Active member of club or organization: Not on file    Attends meetings of clubs or organizations: Not on file    Relationship status: Not on file  . Intimate partner violence:    Fear of current or ex partner: Not on file    Emotionally abused: Not on file    Physically abused: Not on  file    Forced sexual activity: Not on file  Other Topics Concern  . Not on file  Social History Narrative  . Not on file   Family History  Problem Relation Age of Onset  . Diabetes Mother   . Coronary artery disease Mother   . Kidney disease Mother   . Breast cancer Neg Hx    No Known Allergies  Medication list has been reviewed and updated.   General: Denies fever, chills, sweats. No significant weight loss. Eyes: Denies blurring,significant itching ENT: Denies earache, sore throat, and hoarseness.  Cardiovascular: Denies chest pains, palpitations, dyspnea on exertion,  Respiratory: Denies cough, dyspnea at rest,wheeezing Breast: no concerns about lumps GI: Denies nausea, vomiting, diarrhea, constipation, change in bowel habits, abdominal pain, melena, hematochezia GU: Denies dysuria, hematuria, urinary hesitancy, nocturia, denies STD risk, no concerns about discharge Musculoskeletal: Denies back pain, joint pain Derm: Denies rash, itching Neuro: Denies  paresthesias, frequent falls, frequent headaches Psych: Denies depression, anxiety Endocrine: Denies cold intolerance, heat intolerance, polydipsia Heme: Denies enlarged lymph nodes Allergy: No hayfever  Objective:   BP (!) 107/52   Pulse (!) 55   Temp 97.7 F (36.5 C) (Oral)   Ht 5' 7.5" (1.715 m)   Wt 157 lb 8 oz (71.4 kg)   BMI 24.30 kg/m  No exam data present  GEN: well developed, well nourished, no acute distress Eyes: conjunctiva and lids normal, PERRLA, EOMI ENT: TM clear, nares clear, oral exam WNL Neck: supple, no lymphadenopathy, no thyromegaly, no JVD Pulm: clear to auscultation and percussion, respiratory effort normal CV: regular rate and rhythm, S1-S2, no murmur, rub or gallop, no bruits Chest: no scars, masses, no lumps BREAST: breast exam declined GI: soft, non-tender; no hepatosplenomegaly, masses; active bowel sounds [name] quadrants GU: GU exam declined Lymph: no cervical, axillary or  inguinal adenopathy MSK: gait normal, muscle tone and strength WNL, no joint swelling, effusions, discoloration, crepitus  SKIN: clear, good turgor, color WNL, no rashes, lesions, or ulcerations Neuro: normal mental status, normal strength, sensation, and motion Psych: alert; oriented to person, place and time, normally interactive and not anxious or depressed in appearance.   [name] labs reviewed with patient. Lipids:    Component Value Date/Time   CHOL 189 05/18/2018 0841   CHOL 123 09/30/2014  1650   TRIG 103.0 05/18/2018 0841   TRIG 93 09/30/2014 1650   HDL 65.90 05/18/2018 0841   HDL 53 09/30/2014 1650   LDLDIRECT 132.3 03/31/2010 0906   VLDL 20.6 05/18/2018 0841   VLDL 19 09/30/2014 1650   CHOLHDL 3 05/18/2018 0841   CBC: CBC Latest Ref Rng & Units 05/18/2018 05/16/2017 03/04/2016  WBC 4.0 - 10.5 K/uL 3.4(L) 4.8 5.0  Hemoglobin 12.0 - 15.0 g/dL 13.6 13.7 12.6  Hematocrit 36.0 - 46.0 % 40.8 41.3 37.3  Platelets 150.0 - 400.0 K/uL 196.0 189.0 098.1    Basic Metabolic Panel:    Component Value Date/Time   NA 140 05/18/2018 0841   NA 142 07/13/2017 1029   NA 138 10/03/2014 0255   K 4.6 05/18/2018 0841   K 4.2 10/03/2014 0255   CL 104 05/18/2018 0841   CL 105 10/03/2014 0255   CO2 28 05/18/2018 0841   CO2 26 10/03/2014 0255   BUN 17 05/18/2018 0841   BUN 13 07/13/2017 1029   BUN 8 10/03/2014 0255   CREATININE 0.94 05/18/2018 0841   CREATININE 0.81 10/03/2014 0255   CREATININE 1.06 09/10/2012 1616   GLUCOSE 96 05/18/2018 0841   GLUCOSE 65 10/03/2014 0255   CALCIUM 9.5 05/18/2018 0841   CALCIUM 7.8 (L) 10/03/2014 0255   Hepatic Function Latest Ref Rng & Units 05/18/2018 05/16/2017 03/04/2016  Total Protein 6.0 - 8.3 g/dL 7.0 7.2 6.5  Albumin 3.5 - 5.2 g/dL 4.4 4.5 4.2  AST 0 - 37 U/L _0 ALT 0 - 35 U/L _1 Alk Phosphatase 39 - 117 U/L 69 78 73  Total Bilirubin 0.2 - 1.2 mg/dL 0.7 0.6 0.6  Bilirubin, Direct 0.0 - 0.3 mg/dL 0.1 0.1 -    Lab Results    Component Value Date   TSH 6.67 (H) 05/18/2018   No results found.  Assessment and Plan:   Healthcare maintenance  Primary hypothyroidism - Plan: TSH, T3, free, T4, free  TSH, Free T3, Free T4 [name] off - decrease dose and recheck in 6-8 weeks  Health Maintenance Exam: The patient's preventative maintenance and recommended screening tests for an annual wellness exam were reviewed in full today. Brought up to date unless services declined.  Counselled on the importance of diet, exercise, and its role in overall health and mortality. The patient's FH and SH was reviewed, including their home life, tobacco status, and drug and alcohol status.  Follow-up in 1 year for physical exam or additional follow-up below.  Patient Instructions  Stop the Detrol LA (Tolterodine tartrate) and see if this clears up your blurry vision.   If it does not help after about a week, then it is probably not from this medicine and more likely from an eye change.   Decrease your Levothyroxine dosage to only 2 tablets in the morning.  We will need to recheck your thyroid levels in about 2 months.     Follow-up: No follow-ups on file. Or follow-up in 1 year if not noted.  Signed,  Maud Deed. Finn Amos, MD   Allergies as of 05/28/2018   No Known Allergies     Medication List        Accurate as of 05/28/18 10:21 AM. Always use your most recent med list.          acetaminophen 325 MG tablet Commonly known as:  TYLENOL Take 2 tablets (650 mg total) by mouth every 4 (four) hours as needed for mild  pain, fever or headache.   amiodarone 200 MG tablet Commonly known as:  PACERONE TAKE ONE TABLET BY MOUTH EVERY DAY   b complex vitamins tablet Take 1 tablet daily by mouth.   CALCIUM + D PO Take 1 capsule daily by mouth.   carvedilol 3.125 MG tablet Commonly known as:  COREG TAKE ONE TABLET BY MOUTH TWICE DAILY   colchicine 0.6 MG tablet Take 0.5-1 tablets (0.3-0.6 mg total) by mouth daily.    donepezil 5 MG tablet Commonly known as:  ARICEPT TAKE ONE TABLET BY MOUTH AT BEDTIME   FISH OIL PO Take 1 capsule by mouth daily.   IRON (FERROUS SULFATE) PO Take 1 tablet by mouth daily.   levothyroxine 25 MCG tablet Commonly known as:  SYNTHROID, LEVOTHROID Take 2 tablets (50 mcg total) by mouth daily before breakfast.   losartan 25 MG tablet Commonly known as:  COZAAR TAKE ONE TABLET BY MOUTH EVERY DAY   metroNIDAZOLE 0.75 % cream Commonly known as:  METROCREAM Apply topically 2 (two) times daily.   permethrin 5 % cream Commonly known as:  ELIMITE APPLY PEA SIZED AMOUNT TO FACE ONCE A DAY   tolterodine 4 MG 24 hr capsule Commonly known as:  DETROL LA Take 1 capsule (4 mg total) by mouth daily.   warfarin 5 MG tablet Commonly known as:  COUMADIN Take as directed by the anticoagulation clinic. If you are unsure how to take this medication, talk to your nurse or doctor. Original instructions:  TAKE 1/2 TO 1 TABLET EVERY DAY AS DIRECTED BY COUMADIN CLINIC

## 2018-06-15 ENCOUNTER — Other Ambulatory Visit: Payer: Self-pay | Admitting: Family Medicine

## 2018-06-15 DIAGNOSIS — Z1231 Encounter for screening mammogram for malignant neoplasm of breast: Secondary | ICD-10-CM

## 2018-06-19 ENCOUNTER — Other Ambulatory Visit: Payer: Self-pay | Admitting: Family Medicine

## 2018-06-19 ENCOUNTER — Other Ambulatory Visit: Payer: Self-pay

## 2018-06-19 MED ORDER — LOSARTAN POTASSIUM 25 MG PO TABS: 25.0000 mg | ORAL_TABLET | Freq: Every day | ORAL | 1 refills | Status: DC

## 2018-06-19 MED ORDER — METRONIDAZOLE 0.75 % EX CREA: TOPICAL_CREAM | Freq: Two times a day (BID) | CUTANEOUS | 5 refills | Status: DC

## 2018-06-19 MED ORDER — WARFARIN SODIUM 5 MG PO TABS: ORAL_TABLET | ORAL | 1 refills | Status: DC

## 2018-06-19 NOTE — Telephone Encounter (Signed)
Not note regarding lice or scabies that this med is usually used for.

## 2018-06-19 NOTE — Telephone Encounter (Signed)
Last office visit 05/28/2018 for CPE.  Last refilled metronidazole 09/13/2017 for 45 g with 5 refills. Permethrin 03/22/2018.  Ok to refill?

## 2018-06-19 NOTE — Telephone Encounter (Signed)
Copied from [place] [phone]. Topic: Quick Communication - See Telephone Encounter >> Jun 19, 2018  2:04 PM Vernona Rieger wrote: CRM for notification. See Telephone encounter for: 06/19/18.  Chris with [facility] needs a new script for losartan (COZAAR) 25 MG tablet. He said she is transferring to [facility] from [facility]. He would like to have metroNIDAZOLE (METROCREAM) 0.75 % cream & permethrin (ELIMITE) 5 % cream on file but she doesn't need refill. Please Advise.

## 2018-06-19 NOTE — Telephone Encounter (Signed)
Okay to refill the metrogel for rosacea.  What is she using permethrin for? Notes show tapeworm earlier in year,but why directions pea size amount of face.  Is this a use of this med for rosacea I am not aware of?

## 2018-06-25 ENCOUNTER — Other Ambulatory Visit: Payer: Self-pay | Admitting: Family Medicine

## 2018-06-27 ENCOUNTER — Telehealth: Payer: Self-pay

## 2018-06-27 NOTE — Telephone Encounter (Signed)
Pt said changed pharmacy to [facility] and wanted to verify levothyroxine 25 mcg tabs taking 2 tabs po daily before breakfast was correct. Pt said she used to take 2 1/2 tabs in AM. Per office note 05/28/18 changed instructions for levothyroxine 25 mcg to taking 2 tabs (total 50 mcg) po daily before breakfast. Pt voiced understanding and nothing further needed.

## 2018-07-02 ENCOUNTER — Ambulatory Visit: Payer: Medicare HMO

## 2018-07-02 DIAGNOSIS — I4891 Unspecified atrial fibrillation: Secondary | ICD-10-CM | POA: Diagnosis not present

## 2018-07-02 DIAGNOSIS — Z5181 Encounter for therapeutic drug level monitoring: Secondary | ICD-10-CM | POA: Diagnosis not present

## 2018-07-02 DIAGNOSIS — Z7901 Long term (current) use of anticoagulants: Secondary | ICD-10-CM

## 2018-07-02 LAB — POCT INR: INR: 2.7 (ref 2.0–3.0)

## 2018-07-02 NOTE — Patient Instructions (Signed)
Please continue dosage of 1/2 tablet daily except 1 tablet on Mondays, Wednesdays and Fridays. Please be consistent with your greens intake - pick a day(s) each week and have your greens on that same day each week.  Recheck in 6 weeks.  

## 2018-07-06 ENCOUNTER — Ambulatory Visit: Payer: Medicare HMO | Admitting: Family Medicine

## 2018-07-06 ENCOUNTER — Encounter: Payer: Self-pay | Admitting: Family Medicine

## 2018-07-06 VITALS — BP 120/64 | HR 59 | Temp 97.8°F | Ht 67.5 in | Wt 159.5 lb

## 2018-07-06 DIAGNOSIS — J22 Unspecified acute lower respiratory infection: Secondary | ICD-10-CM | POA: Insufficient documentation

## 2018-07-06 DIAGNOSIS — W57XXXA Bitten or stung by nonvenomous insect and other nonvenomous arthropods, initial encounter: Secondary | ICD-10-CM | POA: Diagnosis not present

## 2018-07-06 DIAGNOSIS — S40862A Insect bite (nonvenomous) of left upper arm, initial encounter: Secondary | ICD-10-CM

## 2018-07-06 DIAGNOSIS — Z7901 Long term (current) use of anticoagulants: Secondary | ICD-10-CM | POA: Diagnosis not present

## 2018-07-06 MED ORDER — GUAIFENESIN-CODEINE 100-10 MG/5ML PO SYRP: 5.0000 mL | ORAL_SOLUTION | Freq: Two times a day (BID) | ORAL | 0 refills | Status: DC | PRN

## 2018-07-06 MED ORDER — DOXYCYCLINE HYCLATE 100 MG PO TABS: 100.0000 mg | ORAL_TABLET | Freq: Two times a day (BID) | ORAL | 0 refills | Status: DC

## 2018-07-06 NOTE — Assessment & Plan Note (Signed)
With small bruising after bite, due to coumadin.  Reviewed symptoms of tick born illness to watch for, provided with WASP for doxy 10d course with indications when to fill.  Advised to contact coumadin clinic if she starts doxycycline.

## 2018-07-06 NOTE — Progress Notes (Signed)
BP 120/64 (BP Location: Right Arm, Patient Position: Sitting, Cuff Size: Normal)   Pulse (!) 59   Temp 97.8 F (36.6 C) (Oral)   Ht 5' 7.5" (1.715 m)   Wt 159 lb 8 oz (72.3 kg)   SpO2 97%   BMI 24.61 kg/m    CC: L upper arm tick bite, cough Subjective:    Patient ID: [name], female    DOB: 1946-07-21, 72 y.o.   MRN: [id number]  HPI: [name] is a 71 y.o. female presenting on 07/06/2018 for Insect Bite (Has tick bite located on left upper arm. Noticed tick this morning and removed it. Area is red and painful. ) and Cough (C/o cough and runny noses that started about 3 wks ago. )   Found tick on upper L arm this morning, present for 24-48 hrs. Removed this morning, brings tick in. Fully removed. Tender.   3 wk h/o cough, rhinorrhea, slowly improving. PNDrainage. No fevers/chills, ear or tooth pain, ST or dyspnea or wheezing.  Husband sick at home as well.  H/o asthma, no trouble recently. No smokers at home.  Has tried nyquil, delsym with some benefit.   Relevant past medical, surgical, family and social history reviewed and updated as indicated. Interim medical history since our last visit reviewed. Allergies and medications reviewed and updated. Outpatient Medications Prior to Visit  Medication Sig Dispense Refill  . acetaminophen (TYLENOL) 325 MG tablet Take 2 tablets (650 mg total) by mouth every 4 (four) hours as needed for mild pain, fever or headache. 30 tablet 0  . amiodarone (PACERONE) 200 MG tablet TAKE ONE TABLET BY MOUTH EVERY DAY 90 tablet 3  . b complex vitamins tablet Take 1 tablet daily by mouth.    . Calcium Citrate-Vitamin D (CALCIUM + D PO) Take 1 capsule daily by mouth.     . carvedilol (COREG) 3.125 MG tablet TAKE ONE TABLET BY MOUTH TWICE DAILY 60 tablet 3  . donepezil (ARICEPT) 5 MG tablet TAKE 1 TABLET BY MOUTH DAILY AT BEDTIME 90 tablet 3  . IRON, FERROUS SULFATE, PO Take 1 tablet by mouth daily.    Marland Kitchen levothyroxine (SYNTHROID, LEVOTHROID) 25  MCG tablet Take 2 tablets (50 mcg total) by mouth daily before breakfast. 180 tablet 3  . losartan (COZAAR) 25 MG tablet Take 1 tablet (25 mg total) by mouth daily. 90 tablet 1  . metroNIDAZOLE (METROCREAM) 0.75 % cream Apply topically 2 (two) times daily. 45 g 5  . Omega-3 Fatty Acids (FISH OIL PO) Take 1 capsule by mouth daily.    . permethrin (ELIMITE) 5 % cream APPLY PEA SIZED AMOUNT TO FACE ONCE A DAY  1  . warfarin (COUMADIN) 5 MG tablet TAKE 1/2 TO 1 TABLET EVERY DAY AS DIRECTED BY COUMADIN CLINIC 90 tablet 1  . colchicine 0.6 MG tablet Take 0.5-1 tablets (0.3-0.6 mg total) by mouth daily. (Patient not taking: Reported on 07/06/2018) 60 tablet 5  . tolterodine (DETROL LA) 4 MG 24 hr capsule Take 1 capsule (4 mg total) by mouth daily. (Patient not taking: Reported on 07/06/2018) 30 capsule 6   No facility-administered medications prior to visit.      Per HPI unless specifically indicated in ROS section below Review of Systems     Objective:    BP 120/64 (BP Location: Right Arm, Patient Position: Sitting, Cuff Size: Normal)   Pulse (!) 59   Temp 97.8 F (36.6 C) (Oral)   Ht 5' 7.5" (1.715 m)  Wt 159 lb 8 oz (72.3 kg)   SpO2 97%   BMI 24.61 kg/m   Wt Readings from Last 3 Encounters:  07/06/18 159 lb 8 oz (72.3 kg)  05/28/18 157 lb 8 oz (71.4 kg)  05/18/18 155 lb 12 oz (70.6 kg)    Physical Exam  Constitutional: She appears well-developed and well-nourished. No distress.  HENT:  Head: Normocephalic and atraumatic.  Right Ear: Hearing, tympanic membrane, external ear and ear canal normal.  Left Ear: Hearing, tympanic membrane, external ear and ear canal normal.  Nose: No mucosal edema or rhinorrhea. Right sinus exhibits no maxillary sinus tenderness and no frontal sinus tenderness. Left sinus exhibits no maxillary sinus tenderness and no frontal sinus tenderness.  Mouth/Throat: Uvula is midline, oropharynx is clear and moist and mucous membranes are normal. No oropharyngeal  exudate, posterior oropharyngeal edema, posterior oropharyngeal erythema or tonsillar abscesses.  Eyes: Pupils are equal, round, and reactive to light. Conjunctivae and EOM are normal. No scleral icterus.  Neck: Normal range of motion. Neck supple.  Cardiovascular: Normal rate, regular rhythm, normal heart sounds and intact distal pulses.  No murmur heard. Pulmonary/Chest: Effort normal and breath sounds normal. No respiratory distress. She has no wheezes. She has no rales.  Lymphadenopathy:    She has no cervical adenopathy.  Skin: Skin is warm and dry. Ecchymosis noted. No rash noted.     Small ecchymosis L upper arm at site of tick bite without surrounding erythema or induration - tender to palpation  Nursing note and vitals reviewed.  Results for orders placed or performed in visit on 07/02/18  POCT INR  Result Value Ref Range   INR 2.7 2.0 - 3.0      Assessment & Plan:   Problem List Items Addressed This Visit    Tick bite of left upper arm    With small bruising after bite, due to coumadin.  Reviewed symptoms of tick born illness to watch for, provided with WASP for doxy 10d course with indications when to fill.  Advised to contact coumadin clinic if she starts doxycycline.       Current use of long term anticoagulation (Chronic)   Acute respiratory infection - Primary    Prolonged duration, however slowly improving. Anticipate viral respiratory infection. Supportive care reviewed. Rx cheratussin for cough at night. Update if not improving with treatment.           Meds ordered this encounter  Medications  . guaiFENesin-codeine (CHERATUSSIN AC) 100-10 MG/5ML syrup    Sig: Take 5 mLs by mouth 2 (two) times daily as needed for cough (sedation precautions).    Dispense:  120 mL    Refill:  0  . doxycycline (VIBRA-TABS) 100 MG tablet    Sig: Take 1 tablet (100 mg total) by mouth 2 (two) times daily.    Dispense:  20 tablet    Refill:  0   No orders of the defined  types were placed in this encounter.   Follow up plan: No follow-ups on file.  Ria Bush, MD

## 2018-07-06 NOTE — Patient Instructions (Signed)
You have an upper respiratory infection, likely resolving viral infection.  Antibiotics are not needed for this. The cough after a viral infection can last a few weeks to go away. May take cheratussin codeine cough syrup for night time.  Push fluids and plenty of rest. Plain mucinex with plenty of water to help mobilize mucous.  Please return if you are not improving as expected, or if you have high fevers (>101.5) or difficulty swallowing or worsening productive cough. Call clinic with questions.  Good to see you today. I hope you start feeling better soon.  For tick bite - watch for fevers, rash, joint pains or nausea, abdominal pain. If this develops, start doxycycline antibiotic

## 2018-07-06 NOTE — Assessment & Plan Note (Signed)
Prolonged duration, however slowly improving. Anticipate viral respiratory infection. Supportive care reviewed. Rx cheratussin for cough at night. Update if not improving with treatment.

## 2018-07-09 NOTE — Progress Notes (Signed)
Follow-up Outpatient Visit Date: 07/12/2018  Primary Care Provider: Owens Loffler, [name] [place] [address]   Chief Complaint: Follow-up valvular heart disease and nonischemic cardiomyopathy  HPI:  [name] is a 72 y.o. 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  She was last seen in our office by Christell Faith, PA, a year ago.  At that time, she was doing well.  No medication changes were made at that time.  Echocardiogram was performed, demonstrating low normal LVEF with mild to moderate mitral regurgitation.  Today, [name] reports feeling well.  She notes that her breathing has been affected a little bit by the cold weather, though she denies actually feeling short of breath.  She remains very active around the house without limitations.  She has not had any chest pain, palpitations, lightheadedness, orthopnea, and edema.  She is compliant with her medications.  She has not had any bleeding or focal neurologic deficits.  She was mildly hyperthyroid based on the labs checked in September by her PCP.  Thyroid supplementation was decreased at that time.  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  LVEF 50% with severe mitral  valve prolapse and MR.  RA 3, RV 28/4, PA 21/10 (16), PCWP 12.  Thermodiluation CO 2.5.  CV Surgery:  Mitral valve repair with placement of 44mm Seguin annuloplasty ring and modified Cox-Maze procedure (10/15/03, Dr. Roxy Manns).  EP Procedures and Devices:  DCCV (03/12/04)  DCCV (06/04/12)  DCCV (10/07/14)  Non-Invasive Evaluation(s):  TTE (07/26/17): Mildly dilated LV.  LVEF 50-55% with normal wall motion.  Evidence of mitral valve repair with mild to moderate regurgitation.  Moderate left atrial enlargement.  Normal RV size and function.  Mild pulmonary hypertension.  Myocardial perfusion stress test (12/04/14): No ischemia identified.  LVEF 37% with global hypokinesis, most pronounced in the septum.  Intermediate risk study (due to low LVEF).  TTE (01/06/15): Moderately dilated LV with normal wall thickness.  LVEF 25-30% with grade 3 diastolic dysfunction.  Moderate MR status post mitral valve repair.  LA severely dilated.  Mild TR.  Normal PA pressure.  Normal RV size and function.  TTE (09/09/15): Mildly dilated LV with LVEF 45-50%.  Trivial AI and mild MR.  Moderately dilated LA.  Normal RV size/function and PA pressure.  Recent CV Pertinent Labs: Lab Results  Component Value Date   CHOL 189 05/18/2018   CHOL 123 09/30/2014   HDL 65.90 05/18/2018   HDL 53 09/30/2014   LDLCALC 102 (H) 05/18/2018   LDLCALC 51 09/30/2014   LDLDIRECT 132.3 03/31/2010   TRIG 103.0 05/18/2018   TRIG 93 09/30/2014   CHOLHDL 3 05/18/2018   INR 2.7 07/02/2018   INR 3.57 06/12/2017   INR 2.5 10/03/2014   BNP 691.1 (H) 10/15/2014   K 4.6 05/18/2018   K 4.2 10/03/2014   MG 2.1 10/03/2014   MG 2.3 10/03/2014   BUN 17 05/18/2018  BUN 13 07/13/2017   BUN 8 10/03/2014   CREATININE 0.94 05/18/2018   CREATININE 0.81 10/03/2014   CREATININE 1.06 09/10/2012    Past medical and surgical history were reviewed and updated in EPIC.  Current Meds  Medication Sig  . acetaminophen (TYLENOL) 325 MG tablet  Take 2 tablets (650 mg total) by mouth every 4 (four) hours as needed for mild pain, fever or headache.  Marland Kitchen amiodarone (PACERONE) 200 MG tablet TAKE ONE TABLET BY MOUTH EVERY DAY  . b complex vitamins tablet Take 1 tablet daily by mouth.  . Calcium Citrate-Vitamin D (CALCIUM + D PO) Take 1 capsule daily by mouth.   . carvedilol (COREG) 3.125 MG tablet TAKE ONE TABLET BY MOUTH TWICE DAILY  . colchicine 0.6 MG tablet Take 0.5-1 tablets (0.3-0.6 mg total) by mouth daily.  Marland Kitchen donepezil (ARICEPT) 5 MG tablet TAKE 1 TABLET BY MOUTH DAILY AT BEDTIME  . doxycycline (VIBRA-TABS) 100 MG tablet Take 1 tablet (100 mg total) by mouth 2 (two) times daily.  Marland Kitchen guaiFENesin-codeine (CHERATUSSIN AC) 100-10 MG/5ML syrup Take 5 mLs by mouth 2 (two) times daily as needed for cough (sedation precautions).  . IRON, FERROUS SULFATE, PO Take 1 tablet by mouth daily.  Marland Kitchen levothyroxine (SYNTHROID, LEVOTHROID) 25 MCG tablet Take 2 tablets (50 mcg total) by mouth daily before breakfast.  . losartan (COZAAR) 25 MG tablet Take 1 tablet (25 mg total) by mouth daily.  . metroNIDAZOLE (METROCREAM) 0.75 % cream Apply topically 2 (two) times daily.  . Omega-3 Fatty Acids (FISH OIL PO) Take 1 capsule by mouth daily.  . permethrin (ELIMITE) 5 % cream APPLY PEA SIZED AMOUNT TO FACE ONCE A DAY  . tolterodine (DETROL LA) 4 MG 24 hr capsule Take 1 capsule (4 mg total) by mouth daily.  Marland Kitchen warfarin (COUMADIN) 5 MG tablet TAKE 1/2 TO 1 TABLET EVERY DAY AS DIRECTED BY COUMADIN CLINIC    Allergies: Patient has no known allergies.  Social History   Tobacco Use  . Smoking status: Never Smoker  . Smokeless tobacco: Never Used  Substance Use Topics  . Alcohol use: Yes    Comment: wine occasionally  . Drug use: No    Family History  Problem Relation Age of Onset  . Diabetes Mother   . Coronary artery disease Mother   . Kidney disease Mother   . Breast cancer Neg Hx     Review of Systems: A 12-system review of systems was performed  and was negative except as noted in the HPI.  --------------------------------------------------------------------------------------------------  Physical Exam: BP 140/80 (BP Location: Left Arm, Patient Position: Sitting, Cuff Size: Normal)   Pulse (!) 55   Ht 5' 7.5" (1.715 m)   Wt 160 lb 8 oz (72.8 kg)   BMI 24.77 kg/m   General: NAD. HEENT: No conjunctival pallor or scleral icterus. Moist mucous membranes.  OP clear. Neck: Supple without lymphadenopathy, thyromegaly, JVD, or HJR. No carotid bruit. Lungs: Normal work of breathing. Clear to auscultation bilaterally without wheezes or crackles. Heart: Bradycardic but regular with occasional extrasystoles.  1/6 systolic murmur noted.  No rubs or gallops.  Nondisplaced PMI. Abd: Bowel sounds present. Soft, NT/ND without hepatosplenomegaly Ext: No lower extremity edema. Radial, PT, and DP pulses are 2+ bilaterally. Skin: Warm and dry without rash.  EKG: Sinus bradycardia with sinus arrhythmia and first-degree AV block.  Anterolateral ST/T changes unchanged from prior visit.  Borderline QT prolongation.  Lab Results  Component Value Date   WBC 3.4 (L)  05/18/2018   HGB 13.6 05/18/2018   HCT 40.8 05/18/2018   MCV 103.9 (H) 05/18/2018   PLT 196.0 05/18/2018    Lab Results  Component Value Date   NA 140 05/18/2018   K 4.6 05/18/2018   CL 104 05/18/2018   CO2 28 05/18/2018   BUN 17 05/18/2018   CREATININE 0.94 05/18/2018   GLUCOSE 96 05/18/2018   ALT 21 05/18/2018    Lab Results  Component Value Date   CHOL 189 05/18/2018   HDL 65.90 05/18/2018   LDLCALC 102 (H) 05/18/2018   LDLDIRECT 132.3 03/31/2010   TRIG 103.0 05/18/2018   CHOLHDL 3 05/18/2018    --------------------------------------------------------------------------------------------------  ASSESSMENT AND PLAN: Nonischemic cardiomyopathy Ms. Nihiser appears euvolemic and well compensated with NYHA class I heart failure symptoms.  Echocardiogram a year ago  showed low normal LVEF with mild to moderate mitral regurgitation and mild pulmonary hypertension.  We will continue current medications.  I have encouraged his measured to limit her sodium intake.  Valvular heart disease Mild to moderate mitral regurgitation noted by echo last year following mitral valve repair.  Patient does not have any heart failure symptoms and appears well compensated today.  We will continue indefinite anticoagulation with warfarin, given history of atrial fibrillation.  SBE prophylaxis needs to be continued for elective dental procedures.  Hypertension Blood pressure mildly elevated today, though prior readings over the last few months have all been normal.  I encouraged sodium restriction.  No medication changes today.  Paroxysmal atrial fibrillation/flutter Patient is in sinus bradycardia with sinus arrhythmia today.  She has not had any symptoms of recurrent atrial fibrillation/flutter.  We will continue indefinite amiodarone and anticoagulation with warfarin.  I will defer repeat thyroid function tests to Dr. Lorelei Pont.  LFTs should be repeated in about 6 months.  I will obtain a PA and lateral chest radiograph the patient's convenience in the setting of long-term amiodarone use.  PFTs in April showed questionable reactive airway disease.  No obvious signs of obstructive or restrictive lung disease.  Given lack of symptoms, we will pulmonary consultation at this time.  COPD Continue pulmonary follow-up.  Follow-up: Return to clinic in 1 year.  Deborah Bush, [name] 07/12/2018 8:03 AM

## 2018-07-12 ENCOUNTER — Encounter: Payer: Self-pay | Admitting: Internal Medicine

## 2018-07-12 ENCOUNTER — Ambulatory Visit
Admission: RE | Admit: 2018-07-12 | Discharge: 2018-07-12 | Disposition: A | Discharge status: Home / Self Care | Payer: Medicare HMO | Source: Ambulatory Visit | Attending: Internal Medicine | Admitting: Internal Medicine

## 2018-07-12 ENCOUNTER — Ambulatory Visit: Payer: Medicare HMO | Admitting: Internal Medicine

## 2018-07-12 VITALS — BP 140/80 | HR 55 | Ht 67.5 in | Wt 160.5 lb

## 2018-07-12 DIAGNOSIS — I480 Paroxysmal atrial fibrillation: Secondary | ICD-10-CM

## 2018-07-12 DIAGNOSIS — Z9889 Other specified postprocedural states: Secondary | ICD-10-CM | POA: Diagnosis not present

## 2018-07-12 DIAGNOSIS — I10 Essential (primary) hypertension: Secondary | ICD-10-CM

## 2018-07-12 DIAGNOSIS — I38 Endocarditis, valve unspecified: Secondary | ICD-10-CM | POA: Diagnosis not present

## 2018-07-12 DIAGNOSIS — Z79899 Other long term (current) drug therapy: Secondary | ICD-10-CM

## 2018-07-12 DIAGNOSIS — I428 Other cardiomyopathies: Secondary | ICD-10-CM | POA: Diagnosis not present

## 2018-07-12 DIAGNOSIS — R0989 Other specified symptoms and signs involving the circulatory and respiratory systems: Secondary | ICD-10-CM | POA: Diagnosis not present

## 2018-07-12 NOTE — Patient Instructions (Signed)
Medication Instructions:  Your physician recommends that you continue on your current medications as directed. Please refer to the Current Medication list given to you today.  If you need a refill on your cardiac medications before your next appointment, please call your pharmacy.   Lab work: none If you have labs (blood work) drawn today and your tests are completely normal, you will receive your results only by: Marland Kitchen MyChart Message (if you have MyChart) OR . A paper copy in the mail If you have any lab test that is abnormal or we need to change your treatment, we will call you to review the results.  Testing/Procedures: A chest x-ray takes a picture of the organs and structures inside the chest, including the heart, lungs, and blood vessels. This test can show several things, including, whether the heart is enlarges; whether fluid is building up in the lungs; and whether pacemaker / defibrillator leads are still in place. - Please go to the [facility]. You will check in at the front desk to the right as you walk into the atrium. Valet Parking is offered if needed.    Follow-Up: At [facility], you and your health needs are our priority.  As part of our continuing mission to provide you with exceptional heart care, we have created designated Provider Care Teams.  These Care Teams include your primary Cardiologist (physician) and Advanced Practice Providers (APPs -  Physician Assistants and Nurse Practitioners) who all work together to provide you with the care you need, when you need it. You will need a follow up appointment in 1 years.  Please call our office 2 months in advance to schedule this appointment.  You may see DR Harrell Gave END or one of the following Advanced Practice Providers on your designated Care Team:   Murray Hodgkins, NP Christell Faith, PA-C . Marrianne Mood, PA-C    Chest X-Ray A chest X-ray is a painless test that uses radiation to create images of the  structures inside of your chest. Chest X-rays are used to look for many health conditions, including heart failure, pneumonia, tuberculosis, rib fractures, breathing disorders, and cancer. They may be used to diagnose chest pain, constant coughing, or trouble breathing. Tell a health care provider about:  Any allergies you have.  All medicines you are taking, including vitamins, herbs, eye drops, creams, and over-the-counter medicines.  Any surgeries you have had.  Any medical conditions you have.  Whether you are pregnant or may be pregnant. What are the risks? Getting a chest X-ray is a safe procedure. However, you will be exposed to a small amount of radiation. Being exposed to too much radiation over a lifetime can increase the risk of cancer. This risk is small, but it may occur if you have many X-rays throughout your life. What happens before the procedure?  You may be asked to remove glasses, jewelry, and any other metal objects.  You will be asked to undress from the waist up. You may be given a hospital gown to wear.  You may be asked to wear a protective lead apron to protect parts of your body from radiation. What happens during the procedure?  You will be asked to stand still as each picture is taken to get the best possible images.  You will be asked to take a deep breath and hold your breath for a few seconds.  The X-ray machine will create a picture of your chest using a tiny burst of radiation. This  is painless.  More pictures may be taken from other angles. Typically, one picture will be taken while you face the X-ray camera, and another picture will be taken from the side while you stand. If you cannot stand, you may be asked to lie down. The procedure may vary among health care providers and hospitals. What happens after the procedure?  The X-ray(s) will be reviewed by your health care provider or an X-ray (radiology) specialist.  It is up to you to get your  test results. Ask your health care provider, or the department that is doing the test, when your results will be ready.  Your health care provider will tell you if you need more tests or a follow-up exam. Keep all follow-up visits as told by your health care provider. This is important. Summary  A chest X-ray is a safe, painless test that is used to examine the inside of the chest, heart, and lungs.  You will need to undress from the waist up and remove jewelry and metal objects before the procedure.  You will be exposed to a small amount of radiation during the procedure.  The X-ray machine will take one or more pictures of your chest while you remain as still as possible.  Later, a health care provider or specialist will review the test results with you. This information is not intended to replace advice given to you by your health care provider. Make sure you discuss any questions you have with your health care provider. Document Released: 10/11/2016 Document Revised: 10/11/2016 Document Reviewed: 10/11/2016 Elsevier Interactive Patient Education  [employer].

## 2018-07-13 ENCOUNTER — Ambulatory Visit
Admission: RE | Admit: 2018-07-13 | Discharge: 2018-07-13 | Disposition: A | Discharge status: Home / Self Care | Payer: Medicare HMO | Source: Ambulatory Visit | Attending: Family Medicine | Admitting: Family Medicine

## 2018-07-13 DIAGNOSIS — Z1231 Encounter for screening mammogram for malignant neoplasm of breast: Secondary | ICD-10-CM | POA: Diagnosis not present

## 2018-07-16 ENCOUNTER — Telehealth: Payer: Self-pay | Admitting: Internal Medicine

## 2018-07-16 NOTE — Telephone Encounter (Signed)
Patient calling stating she received  a call back about her chest x ray   Please call back

## 2018-07-16 NOTE — Telephone Encounter (Signed)
Pt verbalized understanding of results of chest xray as stated in result note.

## 2018-07-18 ENCOUNTER — Telehealth: Payer: Self-pay | Admitting: Family Medicine

## 2018-07-18 NOTE — Telephone Encounter (Signed)
error 

## 2018-07-19 ENCOUNTER — Ambulatory Visit: Payer: Medicare HMO | Admitting: Family Medicine

## 2018-07-19 ENCOUNTER — Encounter: Payer: Self-pay | Admitting: Family Medicine

## 2018-07-19 ENCOUNTER — Ambulatory Visit (INDEPENDENT_AMBULATORY_CARE_PROVIDER_SITE_OTHER): Payer: Medicare HMO

## 2018-07-19 VITALS — BP 136/62 | HR 58 | Temp 97.5°F | Ht 67.5 in | Wt 159.6 lb

## 2018-07-19 DIAGNOSIS — M25562 Pain in left knee: Secondary | ICD-10-CM

## 2018-07-19 NOTE — Patient Instructions (Signed)
Nice to meet you  Please try to ice your knee  Please try the exercises  Please see me back in 3-4 weeks if your pain isn't improving  Have a good Thanksgiving.

## 2018-07-19 NOTE — Assessment & Plan Note (Signed)
Acute on chronic left knee pain.  No significant effusion but on previous imaging shows suggestions of CPPD.  No recent trauma. -Injection today. -Counseled on home exercise therapy and supportive care. -If no improvement can consider gel injections versus physical therapy.  Could consider updated imaging.

## 2018-07-19 NOTE — Progress Notes (Signed)
[name] - 72 y.o. female MRN [id number]  Date of birth: 07-Jun-1946  SUBJECTIVE:  Including CC & ROS.  Chief Complaint  Patient presents with  . Knee Pain    left knee pain , flare up 1 week    [name] is a 72 y.o. female that is presenting with left knee pain.  The pain is been occurring for about a week.  Pain is localized to the medial joint line.  Denies any locking or giving way.  Has received steroid injections in the past.  Denies any recent injury or inciting event.  Pain is severe..  Independent of the left knee x-ray from 10/10/2016 shows suggestions of CPPD.   Review of Systems  Constitutional: Negative for fever.  HENT: Negative for congestion.   Respiratory: Negative for cough.   Cardiovascular: Negative for chest pain.  Gastrointestinal: Negative for abdominal pain.  Musculoskeletal: Positive for gait problem.  Skin: Negative for color change.  Neurological: Negative for weakness.  Hematological: Bruises/bleeds easily.  Psychiatric/Behavioral: Negative for agitation.    HISTORY: Past Medical, Surgical, Social, and Family History Reviewed & Updated per EMR.   Pertinent Historical Findings include:  Past Medical History:  Diagnosis Date  . Acne rosacea   . Anxiety   . Asthma   . Atrial fibrillation and flutter ([place]) 09/2014   Revereted from Afib RVR to 2:1 A Flutter -- TEE/ DCCV 2/9; on Amiodarone  . Chronic combined systolic and diastolic CHF, NYHA class 1 (HCC) -- Systolic dysfunction [id number][G38.75] 09/2014   Exacerbation 09/2014 2/2 Afib/flutter with RVR - 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 Mod LA dilation, normal PA pressures   . CVA (cerebral infarction)    h/o superior cerebellar infarct  . Depression   . Digoxin toxicity 09/2014  . 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 Diverticulosis   . H/O: rheumatic fever    Childhood  . HYPERLIPIDEMIA 04/05/2010   Qualifier: Diagnosis of  By: Lorelei Pont MD, Frederico Hamman    . Hypothyroid    On replacement therapy; normal TSH 09/2014  . Migraine headache   . Osteopenia   . Paroxysmal atrial fibrillation ([place]) 2005; Recurrent 09/2014   a) 2005: s/p [facility] Maze & LAA ligation; b) 09/2014: TEE-cardioversion  . Rheumatic mitral and aortic valve insufficiency 2005   a) s/p MV Ring repair; with Cox-Maze for Afib; b) Echo 2013: EF 60-65%,no Regional WMA, Gr 2 DD, no Sig MR ;; c) TEE 10/07/2014: Severlely thickened and calcified MV leaflets. Posterior MV leaflet is fixed and immobile. MAC. reduced excursion of the anterior MV leaflet. Mean MV gradient was 61m Hg and MVA calculated at cm2.    . Urinary incontinence     Past Surgical History:  Procedure Laterality Date  . ABDOMINAL HYSTERECTOMY    . CARDIAC CATHETERIZATION  Jan 2005   Pre-op R&LHC -- Nonobstructive CAD; normal PA pressures  . CARDIOVERSION  06/04/2012   Procedure: CARDIOVERSION;  Surgeon: JCarlena Bjornstad MD;  [facility];  Service: Cardiovascular;  Laterality: N/A;  . CARDIOVERSION N/A 10/07/2014  Procedure: CARDIOVERSION;  Surgeon: Sueanne Margarita, MD;  Location: [place];  Service: Cardiovascular;  Laterality: N/A;  . COLONOSCOPY WITH PROPOFOL N/A 07/11/2017   Procedure: COLONOSCOPY WITH PROPOFOL;  Surgeon: Toledo, Benay Pike, MD;  Location: [facility] ENDOSCOPY;  Service: Gastroenterology;  Laterality: N/A;  . LUMBAR DISC SURGERY     x 2 distantly  . MITRAL VALVE REPAIR  2005   with [facility] for [employer]  . NM MYOVIEW LTD  4/7/'16   EF 37% with septal hypokinesis and diffuse hypokinesis. No ischemia or infarction. Read as "intermediate risk  "secondary to decreased EF consistent with nonischemic cardiac myopathy.  Marland Kitchen RIGHT HEART CATHETERIZATION N/A 10/03/2014   Procedure: RIGHT HEART CATH;  Surgeon: Jolaine Artist, MD;  Location: [facility] CATH LAB;  Service: Cardiovascular;  RAP 57mHg, RVP 35/2/9 mmHg, PAP 45/12 mmHg, PCWP 16 mmHg; CO/I by Fick: 3.9/2.3; Ao/PA/SVC SaO2%: 97%/63%/65%.  . TEE WITHOUT CARDIOVERSION N/A 10/07/2014   Procedure: TRANSESOPHAGEAL ECHOCARDIOGRAM (TEE);  Surgeon: TSueanne Margarita MD;  [facility];  Service: Cardiovascular;  Laterality: N/A;  . THYROIDECTOMY, PARTIAL     partial-no cancer; now on thyroid replacement  . TRANSTHORACIC ECHOCARDIOGRAM  2013; 2/'16 & 5/'16   a) 2013: EF 60-65%, mild MR, Gr 2 DD; b[place] ~10% - severel global LV dysfunction (systolic & diastolic) - dilated LV & restrictive filling pattern - Gr 3 DD, mod reduced RV function, severely dilated LA - 6.4 cm, mod dilated RA, mod pl effusion, mod MR, mild TR; c) 5/10/'16:  EF 25-30%, mod LV dilation, no RWMA,Gr 3 DD w/ high LAP, MV sewing ring intact w/ Mod MR, Severe LA dilation  . TRANSTHORACIC ECHOCARDIOGRAM  January 2017:   EF improved to 45-50%. Mild diffuse HK. Mild MR. Moderate LA dilation. Normal PA pressures.    No Known Allergies  Family History  Problem Relation Age of Onset  . Diabetes Mother   . Coronary artery disease Mother   . Kidney disease Mother   . Breast cancer Neg Hx      Social History   Socioeconomic History  . Marital status: Married    Spouse name: Not on file  . Number of children: 2  . Years of education: Not on file  . Highest education level: Not on file  Occupational History  . Occupation: Retired  SScientific laboratory technician . Financial resource strain: Not on file  . Food insecurity:    Worry: Not on file    Inability: Not on file  . Transportation needs:    Medical: Not on file    Non-medical: Not on file  Tobacco Use  . Smoking status: Never Smoker  . Smokeless tobacco: Never Used    Substance and Sexual Activity  . Alcohol use: Yes    Comment: wine occasionally  . Drug use: No  . Sexual activity: Yes  Lifestyle  . Physical activity:    Days per week: Not on file    Minutes per session: Not on file  . Stress: Not on file  Relationships  . Social connections:    Talks on phone: Not on file    Gets together: Not on file    Attends religious service: Not on file    Active member of club or organization: Not on file    Attends meetings of clubs or organizations: Not on file    Relationship status: Not on file  . Intimate partner violence:    Fear of current or ex  partner: Not on file    Emotionally abused: Not on file    Physically abused: Not on file    Forced sexual activity: Not on file  Other Topics Concern  . Not on file  Social History Narrative  . Not on file     PHYSICAL EXAM:  VS: BP 136/62 (BP Location: Right Arm, Patient Position: Sitting, Cuff Size: Normal)   Pulse (!) 58   Temp (!) 97.5 F (36.4 C) (Oral)   Ht 5' 7.5" (1.715 m)   Wt 159 lb 9.6 oz (72.4 kg)   SpO2 97%   BMI 24.63 kg/m  Physical Exam Gen: NAD, alert, cooperative with exam, well-appearing ENT: normal lips, normal nasal mucosa,  Eye: normal EOM, normal conjunctiva and lids CV:  no edema, +2 pedal pulses   Resp: no accessory muscle use, non-labored,  GI: no masses or tenderness, no hernia  Skin: no rashes, no areas of induration  Neuro: normal tone, normal sensation to touch Psych:  normal insight, alert and oriented MSK:  Left knee: No obvious effusion. Tenderness palpation over the medial joint line. Normal range of motion. Normal strength resistance. Negative McMurray's test. No pain with patellar grind. No instability with valgus varus stress testing. Neurovascular intact   Aspiration/Injection Procedure Note DEBRIA BROECKER August 08, 1946  Procedure: Injection Indications: Left knee pain  Procedure Details Consent: Risks of procedure as well as the  alternatives and risks of each were explained to the (patient/caregiver).  Consent for procedure obtained. Time Out: Verified patient identification, verified procedure, site/side was marked, verified correct patient position, special equipment/implants available, medications/allergies/relevent history reviewed, required imaging and test results available.  Performed.  The area was cleaned with iodine and alcohol swabs.    The left knee superior lateral suprapatellar pouch was injected using 1 cc's of 40 mg Kenalog and 4 cc's of 0.25% bupivacaine with a 22 1 1/2" needle.  Ultrasound was used. Images were obtained in Long views showing the injection.    A sterile dressing was applied.  Patient did tolerate procedure well.       ASSESSMENT & PLAN:   Acute pain of left knee Acute on chronic left knee pain.  No significant effusion but on previous imaging shows suggestions of CPPD.  No recent trauma. -Injection today. -Counseled on home exercise therapy and supportive care. -If no improvement can consider gel injections versus physical therapy.  Could consider updated imaging.

## 2018-07-31 ENCOUNTER — Other Ambulatory Visit: Payer: Self-pay | Admitting: Cardiovascular Disease

## 2018-08-13 ENCOUNTER — Ambulatory Visit: Payer: Medicare HMO

## 2018-08-13 DIAGNOSIS — M25562 Pain in left knee: Secondary | ICD-10-CM

## 2018-08-13 DIAGNOSIS — J22 Unspecified acute lower respiratory infection: Secondary | ICD-10-CM | POA: Diagnosis not present

## 2018-08-13 DIAGNOSIS — I4891 Unspecified atrial fibrillation: Secondary | ICD-10-CM | POA: Diagnosis not present

## 2018-08-13 DIAGNOSIS — Z5181 Encounter for therapeutic drug level monitoring: Secondary | ICD-10-CM

## 2018-08-13 DIAGNOSIS — Z7901 Long term (current) use of anticoagulants: Secondary | ICD-10-CM

## 2018-08-13 LAB — POCT INR: INR: 2 (ref 2.0–3.0)

## 2018-08-13 NOTE — Patient Instructions (Signed)
Please continue dosage of 1/2 tablet daily except 1 tablet on Mondays, Wednesdays and Fridays. Please be consistent with your greens intake - pick a day(s) each week and have your greens on that same day each week.  Recheck in 6 weeks.

## 2018-09-18 DIAGNOSIS — I781 Nevus, non-neoplastic: Secondary | ICD-10-CM | POA: Diagnosis not present

## 2018-09-18 DIAGNOSIS — D045 Carcinoma in situ of skin of trunk: Secondary | ICD-10-CM | POA: Diagnosis not present

## 2018-09-18 DIAGNOSIS — D485 Neoplasm of uncertain behavior of skin: Secondary | ICD-10-CM | POA: Diagnosis not present

## 2018-09-18 DIAGNOSIS — L570 Actinic keratosis: Secondary | ICD-10-CM | POA: Diagnosis not present

## 2018-09-19 ENCOUNTER — Other Ambulatory Visit: Payer: Self-pay | Admitting: Internal Medicine

## 2018-09-24 ENCOUNTER — Ambulatory Visit (INDEPENDENT_AMBULATORY_CARE_PROVIDER_SITE_OTHER): Payer: Medicare HMO

## 2018-09-24 DIAGNOSIS — Z7722 Contact with and (suspected) exposure to environmental tobacco smoke (acute) (chronic): Secondary | ICD-10-CM | POA: Diagnosis not present

## 2018-09-24 DIAGNOSIS — Z7901 Long term (current) use of anticoagulants: Secondary | ICD-10-CM | POA: Diagnosis not present

## 2018-09-24 DIAGNOSIS — Z5181 Encounter for therapeutic drug level monitoring: Secondary | ICD-10-CM | POA: Diagnosis not present

## 2018-09-24 DIAGNOSIS — R69 Illness, unspecified: Secondary | ICD-10-CM | POA: Diagnosis not present

## 2018-09-24 DIAGNOSIS — G3184 Mild cognitive impairment, so stated: Secondary | ICD-10-CM | POA: Diagnosis not present

## 2018-09-24 DIAGNOSIS — R32 Unspecified urinary incontinence: Secondary | ICD-10-CM | POA: Diagnosis not present

## 2018-09-24 DIAGNOSIS — I4891 Unspecified atrial fibrillation: Secondary | ICD-10-CM | POA: Diagnosis not present

## 2018-09-24 DIAGNOSIS — L719 Rosacea, unspecified: Secondary | ICD-10-CM | POA: Diagnosis not present

## 2018-09-24 DIAGNOSIS — I10 Essential (primary) hypertension: Secondary | ICD-10-CM | POA: Diagnosis not present

## 2018-09-24 DIAGNOSIS — I4892 Unspecified atrial flutter: Secondary | ICD-10-CM | POA: Diagnosis not present

## 2018-09-24 DIAGNOSIS — E039 Hypothyroidism, unspecified: Secondary | ICD-10-CM | POA: Diagnosis not present

## 2018-09-24 LAB — POCT INR: INR: 1.9 — AB (ref 2.0–3.0)

## 2018-09-24 NOTE — Patient Instructions (Signed)
Please take extra 1/2 dose today & tomorrow, then continue dosage of 1/2 tablet daily except 1 tablet on Mondays, Wednesdays and Fridays. Please be consistent with your greens intake - pick a day(s) each week and have your greens on that same day each week.  Recheck in 6 weeks.

## 2018-10-15 DIAGNOSIS — D0461 Carcinoma in situ of skin of right upper limb, including shoulder: Secondary | ICD-10-CM | POA: Diagnosis not present

## 2018-10-15 DIAGNOSIS — C4442 Squamous cell carcinoma of skin of scalp and neck: Secondary | ICD-10-CM | POA: Diagnosis not present

## 2018-11-05 ENCOUNTER — Ambulatory Visit (INDEPENDENT_AMBULATORY_CARE_PROVIDER_SITE_OTHER): Payer: Medicare HMO

## 2018-11-05 DIAGNOSIS — I4891 Unspecified atrial fibrillation: Secondary | ICD-10-CM

## 2018-11-05 DIAGNOSIS — Z5181 Encounter for therapeutic drug level monitoring: Secondary | ICD-10-CM | POA: Diagnosis not present

## 2018-11-05 DIAGNOSIS — Z7901 Long term (current) use of anticoagulants: Secondary | ICD-10-CM | POA: Diagnosis not present

## 2018-11-05 LAB — POCT INR: INR: 1.4 — AB (ref 2.0–3.0)

## 2018-11-05 NOTE — Patient Instructions (Signed)
Please take 1 whole tablet tonight, then START NEW DOSAGE of 1 tablet daily except 1/2 tablet on Mondays, Wednesdays and Fridays. Please be consistent with your greens intake - pick a day(s) each week and have your greens on that same day each week.  Recheck in 4 weeks.

## 2018-11-07 ENCOUNTER — Telehealth: Payer: Self-pay | Admitting: Family Medicine

## 2018-11-07 ENCOUNTER — Encounter: Payer: Self-pay | Admitting: Family Medicine

## 2018-11-07 ENCOUNTER — Ambulatory Visit: Payer: Medicare HMO | Admitting: Family Medicine

## 2018-11-07 ENCOUNTER — Other Ambulatory Visit: Payer: Self-pay

## 2018-11-07 VITALS — BP 120/60 | HR 52 | Temp 98.3°F | Ht 67.5 in | Wt 159.2 lb

## 2018-11-07 DIAGNOSIS — R1084 Generalized abdominal pain: Secondary | ICD-10-CM | POA: Diagnosis not present

## 2018-11-07 DIAGNOSIS — N6320 Unspecified lump in the left breast, unspecified quadrant: Secondary | ICD-10-CM | POA: Diagnosis not present

## 2018-11-07 DIAGNOSIS — F5101 Primary insomnia: Secondary | ICD-10-CM

## 2018-11-07 DIAGNOSIS — R69 Illness, unspecified: Secondary | ICD-10-CM | POA: Diagnosis not present

## 2018-11-07 DIAGNOSIS — J0100 Acute maxillary sinusitis, unspecified: Secondary | ICD-10-CM

## 2018-11-07 DIAGNOSIS — R0602 Shortness of breath: Secondary | ICD-10-CM

## 2018-11-07 DIAGNOSIS — M5440 Lumbago with sciatica, unspecified side: Secondary | ICD-10-CM | POA: Diagnosis not present

## 2018-11-07 DIAGNOSIS — R5383 Other fatigue: Secondary | ICD-10-CM

## 2018-11-07 DIAGNOSIS — G8929 Other chronic pain: Secondary | ICD-10-CM

## 2018-11-07 DIAGNOSIS — I5042 Chronic combined systolic (congestive) and diastolic (congestive) heart failure: Secondary | ICD-10-CM

## 2018-11-07 DIAGNOSIS — M542 Cervicalgia: Secondary | ICD-10-CM | POA: Diagnosis not present

## 2018-11-07 DIAGNOSIS — M545 Low back pain, unspecified: Secondary | ICD-10-CM

## 2018-11-07 LAB — CBC WITH DIFFERENTIAL/PLATELET
Basophils Absolute: 0 10*3/uL (ref 0.0–0.1)
Basophils Relative: 0.9 % (ref 0.0–3.0)
EOS ABS: 0.1 10*3/uL (ref 0.0–0.7)
Eosinophils Relative: 1.8 % (ref 0.0–5.0)
HEMATOCRIT: 40.3 % (ref 36.0–46.0)
Hemoglobin: 13.5 g/dL (ref 12.0–15.0)
LYMPHS PCT: 26.5 % (ref 12.0–46.0)
Lymphs Abs: 1.1 10*3/uL (ref 0.7–4.0)
MCHC: 33.4 g/dL (ref 30.0–36.0)
MCV: 104.7 fl — AB (ref 78.0–100.0)
MONOS PCT: 7.4 % (ref 3.0–12.0)
Monocytes Absolute: 0.3 10*3/uL (ref 0.1–1.0)
NEUTROS PCT: 63.4 % (ref 43.0–77.0)
Neutro Abs: 2.6 10*3/uL (ref 1.4–7.7)
Platelets: 180 10*3/uL (ref 150.0–400.0)
RBC: 3.85 Mil/uL — AB (ref 3.87–5.11)
RDW: 12.7 % (ref 11.5–15.5)
WBC: 4.1 10*3/uL (ref 4.0–10.5)

## 2018-11-07 LAB — BASIC METABOLIC PANEL
BUN: 15 mg/dL (ref 6–23)
CO2: 28 mEq/L (ref 19–32)
CREATININE: 0.98 mg/dL (ref 0.40–1.20)
Calcium: 9.2 mg/dL (ref 8.4–10.5)
Chloride: 106 mEq/L (ref 96–112)
GFR: 55.46 mL/min — AB (ref >60.00)
Glucose, Bld: 98 mg/dL (ref 70–99)
Potassium: 4.7 mEq/L (ref 3.5–5.1)
SODIUM: 140 meq/L (ref 135–145)

## 2018-11-07 LAB — LIPASE: Lipase: 34 U/L (ref 11.0–59.0)

## 2018-11-07 LAB — HEPATIC FUNCTION PANEL
ALBUMIN: 4.4 g/dL (ref 3.5–5.2)
ALK PHOS: 68 U/L (ref 39–117)
ALT: 20 U/L (ref 0–35)
AST: 20 U/L (ref 0–37)
Bilirubin, Direct: 0.1 mg/dL (ref 0.0–0.3)
Total Bilirubin: 0.7 mg/dL (ref 0.2–1.2)
Total Protein: 6.8 g/dL (ref 6.0–8.3)

## 2018-11-07 LAB — TSH: TSH: 5.72 u[IU]/mL — ABNORMAL HIGH (ref 0.35–4.50)

## 2018-11-07 LAB — BRAIN NATRIURETIC PEPTIDE: Pro B Natriuretic peptide (BNP): 117 pg/mL — ABNORMAL HIGH (ref 0.0–100.0)

## 2018-11-07 MED ORDER — AMOXICILLIN-POT CLAVULANATE 875-125 MG PO TABS: 1.0000 | ORAL_TABLET | Freq: Two times a day (BID) | ORAL | 0 refills | Status: AC

## 2018-11-07 MED ORDER — CARVEDILOL 3.125 MG PO TABS: 3.1250 mg | ORAL_TABLET | Freq: Two times a day (BID) | ORAL | 1 refills | Status: DC

## 2018-11-07 MED ORDER — PREDNISONE 20 MG PO TABS: 20.0000 mg | ORAL_TABLET | Freq: Every day | ORAL | 0 refills | Status: DC

## 2018-11-07 MED ORDER — TRAZODONE HCL 50 MG PO TABS: 25.0000 mg | ORAL_TABLET | Freq: Every evening | ORAL | 3 refills | Status: DC | PRN

## 2018-11-07 NOTE — Addendum Note (Signed)
Addended by: Owens Loffler on: 11/07/2018 03:31 PM   Modules accepted: Orders

## 2018-11-07 NOTE — Telephone Encounter (Signed)
Spoke with Melissa @ [email or web address].  Pt isn't due for bilateral mammogram till nov.  Please change mammogram order to [id number]

## 2018-11-07 NOTE — Progress Notes (Signed)
Dr. Frederico Hamman T. Mika Anastasi, MD, [facility] Primary Care and Sports Medicine [place], [address] Phone: [phone] Fax: [phone]  11/07/2018  Patient: [name], MRN: [id number], DOB: 1946/06/02, 73 y.o.  Primary Physician:  Owens Loffler, MD   Chief Complaint  Patient presents with  . Headache    Ongoing mild headache/post nasal drip  . Neck Pain  . Wall  . Back Pain  . Breast Soreness    Left  . Leg Pain    Trouble walking with labored breathing   Subjective:   [name] is a 73 y.o. very pleasant female patient who presents with the following:  Multiple problems today.  Last Sunday after she went out to eat, went out and felt kind of bad.  Every since then, felt nauseated some.  Normally does not have headaches and periodicaly will have temporal headaches.  No recent illness. Ongoing post-nasal drip.  Neck hurts, low back hurts.  Periodically off and on for years.  Had a mammogram.  Had some dense breasts. Screening mammo 3D normal 11/19, No [place]. Pain and mass in the Left breast tissue.  Incline in her yard and some sharp pain going down both sides.  Veins in both legs are hurting.  Varicose veins and spider veins  Labored breathing.  More SOB with activity and known CHF. Sinusitis.  Sleeping poorly, melatonin not helping.  EMAIL AMANDA ABOUT HER COUMADIN RECHECK ON AUGMENTIN  Past Medical History, Surgical History, Social History, Family History, Problem List, Medications, and Allergies have been reviewed and updated if relevant.  Patient Active Problem List   Diagnosis Date Noted  . Mild dementia ([place]) 10/24/2014    Priority: High  . Chronic combined systolic and diastolic CHF, NYHA class 1 ([place]) 09/29/2014    Priority: High  . 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 Generalized anxiety disorder 11/18/2008    Priority: Medium  . Major depressive disorder, recurrent episode, moderate with anxious distress ([place]) 11/18/2008    Priority: Medium  . Acute pain of left knee 07/19/2018  . Valvular heart disease 07/12/2018  . Essential hypertension 07/12/2018  . Tick bite of left upper arm 07/06/2018  . Acute respiratory infection 07/06/2018  . Insomnia 12/05/2017  . Primary osteoarthritis of both knees 10/12/2016  . Fracture of left pelvis ([place]) 04/29/2015  . PAF (paroxysmal atrial fibrillation) ([place]) 03/04/2015  . On amiodarone therapy 11/14/2014  . Current use of long term anticoagulation 11/14/2014  . Hyperlipidemia LDL goal <100 04/05/2010  . GERD 04/05/2010  . Hypothyroid 11/18/2008  . ASTHMA 11/18/2008  . Mixed incontinence urge and stress 11/18/2008  . NEPHROLITHIASIS, HX OF 11/18/2008    Past Medical History:  Diagnosis Date  . Acne rosacea   . Anxiety   . Asthma   . Atrial fibrillation and flutter ([place]) 09/2014   Revereted from Afib RVR to 2:1 A Flutter -- TEE/ DCCV 2/9; on Amiodarone  . Chronic combined systolic and diastolic CHF, NYHA class 1 ([facility]) -- Systolic dysfunction [id number][T24.58] 09/2014   Exacerbation 09/2014 2/2 Afib/flutter with RVR - 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 Mod LA dilation, normal PA pressures   . CVA (cerebral infarction)    h/o superior cerebellar infarct  . Depression   .  Digoxin toxicity 09/2014  . 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 Diverticulosis   . H/O: rheumatic fever    Childhood  .  HYPERLIPIDEMIA 04/05/2010   Qualifier: Diagnosis of  By: Lorelei Pont MD, Frederico Hamman    . Hypothyroid    On replacement therapy; normal TSH 09/2014  . Migraine headache   . Osteopenia   . Paroxysmal atrial fibrillation ([place]) 2005; Recurrent 09/2014   a) 2005: s/p Cox Maze & LAA ligation; b) 09/2014: TEE-cardioversion  . Rheumatic mitral and aortic valve insufficiency 2005   a) s/p MV Ring repair; with Cox-Maze for Afib; b) Echo 2013: EF 60-65%,no Regional WMA, Gr 2 DD, no Sig MR ;; c) TEE 10/07/2014: Severlely thickened and calcified MV leaflets. Posterior MV leaflet is fixed and immobile. MAC. reduced excursion of the anterior MV leaflet. Mean MV gradient was 57m Hg and MVA calculated at cm2.    . Urinary incontinence     Past Surgical History:  Procedure Laterality Date  . ABDOMINAL HYSTERECTOMY    . CARDIAC CATHETERIZATION  Jan 2005   Pre-op R&LHC -- Nonobstructive CAD; normal PA pressures  . CARDIOVERSION  06/04/2012   Procedure: CARDIOVERSION;  Surgeon: JCarlena Bjornstad MD;  [facility];  Service: Cardiovascular;  Laterality: N/A;  . CARDIOVERSION N/A 10/07/2014   Procedure: CARDIOVERSION;  Surgeon: TSueanne Margarita MD;  [facility]  Service: Cardiovascular;  Laterality: N/A;  . COLONOSCOPY WITH PROPOFOL N/A 07/11/2017   Procedure: COLONOSCOPY WITH PROPOFOL;  Surgeon: [place], TBenay Pike MD;  Location: [facility] ENDOSCOPY;  Service: Gastroenterology;  Laterality: N/A;  . LUMBAR DISC SURGERY     x 2 distantly  . MITRAL VALVE REPAIR  2005   with [facility] Maze for [employer] . NM MYOVIEW LTD  4/7/'16   EF 37% with septal hypokinesis and diffuse hypokinesis. No ischemia or infarction. 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  .Marland KitchenRIGHT HEART CATHETERIZATION N/A 10/03/2014   Procedure: RIGHT HEART CATH;  Surgeon: DJolaine Artist MD;  [facility];  Service: Cardiovascular;  RAP 552mg, RVP 35/2/9 mmHg, PAP 45/12 mmHg, PCWP 16  mmHg; CO/I by Fick: 3.9/2.3; Ao/PA/SVC SaO2%: 97%/63%/65%.  . TEE WITHOUT CARDIOVERSION N/A 10/07/2014   Procedure: TRANSESOPHAGEAL ECHOCARDIOGRAM (TEE);  Surgeon: TrSueanne MargaritaMD;  [facility];  Service: Cardiovascular;  Laterality: N/A;  . THYROIDECTOMY, PARTIAL     partial-no cancer; now on thyroid replacement  . TRANSTHORACIC ECHOCARDIOGRAM  2013; 2/'16 & 5/'16   a) 2013: EF 60-65%, mild MR, Gr 2 DD; b) 2/'[place] [facility] ~10% - severel global LV dysfunction (systolic & diastolic) - dilated LV & restrictive filling pattern - Gr 3 DD, mod reduced RV function, severely dilated LA - 6.4 cm, mod dilated RA, mod pl effusion, mod MR, mild TR; c) 5/10/'16:  EF 25-30%, mod LV dilation, no RWMA,Gr 3 DD w/ high LAP, MV sewing ring intact w/ Mod MR, Severe LA dilation  . TRANSTHORACIC ECHOCARDIOGRAM  January 2017:   EF improved to 45-50%. Mild diffuse HK. Mild MR. Moderate LA dilation. Normal PA pressures.  Social History   Socioeconomic History  . Marital status: Married    Spouse name: Not on file  . Number of children: 2  . Years of education: Not on file  . Highest education level: Not on file  Occupational History  . Occupation: Retired  Scientific laboratory technician  . Financial resource strain: Not on file  . Food insecurity:    Worry: Not on file    Inability: Not on file  . Transportation needs:    Medical: Not on file    Non-medical: Not on file  Tobacco Use  . Smoking status: Never Smoker  . Smokeless tobacco: Never Used  Substance and Sexual Activity  . Alcohol use: Yes    Comment: wine occasionally  . Drug use: No  . Sexual activity: Yes  Lifestyle  . Physical activity:    Days per week: Not on file    Minutes per session: Not on file  . Stress: Not on file  Relationships  . Social connections:    Talks on phone: Not on file    Gets together: Not on file    Attends religious service: Not on file    Active member of club or organization: Not on file    Attends meetings  of clubs or organizations: Not on file    Relationship status: Not on file  . Intimate partner violence:    Fear of current or ex partner: Not on file    Emotionally abused: Not on file    Physically abused: Not on file    Forced sexual activity: Not on file  Other Topics Concern  . Not on file  Social History Narrative  . Not on file    Family History  Problem Relation Age of Onset  . Diabetes Mother   . Coronary artery disease Mother   . Kidney disease Mother   . Breast cancer Neg Hx     No Known Allergies  Medication list reviewed and updated in full in [place].  ROS: GEN: Acute illness details above GI: Tolerating PO intake GU: maintaining adequate hydration and urination Pulm: + SOB Interactive and getting along well at home. Otherwise, the pertinent positives and negatives are listed above and in the HPI, otherwise a full review of systems has been reviewed and is negative unless noted positive.   Objective:   BP 120/60   Pulse (!) 52   Temp 98.3 F (36.8 C) (Oral)   Ht 5' 7.5" (1.715 m)   Wt 159 lb 4 oz (72.2 kg)   SpO2 98%   BMI 24.57 kg/m    Gen: WDWN, NAD; alert,appropriate and cooperative throughout exam    HEENT: Normocephalic and atraumatic. Throat clear, w/o exudate, no LAD, R TM clear, L TM - good landmarks, No fluid present. rhinnorhea.  Left frontal and maxillary sinuses: Tender Right frontal and maxillary sinuses: Tender    Neck: No ant or post LAD  CV: RRR, No M/G/R  Pulm: Breathing comfortably in no resp distress. no w/c/r  BREAST: breast exam normal. Chaperoned examination. Entirety of breast examined including axilla, and no enlarged lymph nodes. ? L breast mass at 4 oclock position with pain on palpation. No nipple discharge. Seb K on skin changes. This portion of the physical examination was chaperoned by Hedy Camara, CMA.  Abd: S,NT,ND,+BS Extr: no c/c/e Psych: full affect, pleasant   Laboratory and Imaging Data:   Assessment and Plan:   Acute non-recurrent maxillary sinusitis  Other fatigue - Plan:  Basic metabolic panel, CBC with Differential/Platelet, Hepatic function panel, TSH  Abdominal pain, generalized - Plan: Lipase  Shortness of breath - Plan: Brain natriuretic peptide  Chronic combined systolic and diastolic CHF, NYHA class 1 (HCC) - Plan: Brain natriuretic peptide  Chronic neck pain  Low back pain with radiation  Left breast mass - Plan: MM DIAG BREAST TOMO BILATERAL, US BREAST LTD UNI LEFT INC AXILLA  Primary insomnia  >40 minutes spent in face to face time with patient, >50% spent in counselling or coordination of care: multiple questions and anxieties answered to the best of my ability.  ABX and steroids for sinusitis. Steroids for neck and back.  Check BNP given SOB, CHF history  With L breast change at 4 oclock postion, check diag mammo with [place] to eval for neoplasm.  Sl  Trial of trazadone for insomnia  Follow-up: Return in about 4 weeks (around 12/05/2018).  Meds ordered this encounter  Medications  . traZODone (DESYREL) 50 MG tablet    Sig: Take 0.5-1 tablets (25-50 mg total) by mouth at bedtime as needed for sleep.    Dispense:  30 tablet    Refill:  3  . predniSONE (DELTASONE) 20 MG tablet    Sig: Take 1 tablet (20 mg total) by mouth daily with breakfast.    Dispense:  7 tablet    Refill:  0  . amoxicillin-clavulanate (AUGMENTIN) 875-125 MG tablet    Sig: Take 1 tablet by mouth 2 (two) times daily for 10 days.    Dispense:  20 tablet    Refill:  0  . carvedilol (COREG) 3.125 MG tablet    Sig: Take 1 tablet (3.125 mg total) by mouth 2 (two) times daily.    Dispense:  180 tablet    Refill:  1   Orders Placed This Encounter  Procedures  . MM DIAG BREAST TOMO BILATERAL  . US BREAST LTD UNI LEFT INC AXILLA  . Basic metabolic panel  . CBC with Differential/Platelet  . Hepatic function panel  . TSH  . Lipase  . Brain natriuretic peptide    Signed,   Romell Cavanah T. Joani Cosma, MD   Outpatient Encounter Medications as of 11/07/2018  Medication Sig  . acetaminophen (TYLENOL) 325 MG tablet Take 2 tablets (650 mg total) by mouth every 4 (four) hours as needed for mild pain, fever or headache.  Marland Kitchen amiodarone (PACERONE) 200 MG tablet TAKE 1 TABLET BY MOUTH ONCE A DAY  . b complex vitamins tablet Take 1 tablet daily by mouth.  . Calcium Citrate-Vitamin D (CALCIUM + D PO) Take 1 capsule daily by mouth.   . carvedilol (COREG) 3.125 MG tablet Take 1 tablet (3.125 mg total) by mouth 2 (two) times daily.  Marland Kitchen donepezil (ARICEPT) 5 MG tablet TAKE 1 TABLET BY MOUTH DAILY AT BEDTIME  . IRON, FERROUS SULFATE, PO Take 1 tablet by mouth daily.  Marland Kitchen losartan (COZAAR) 25 MG tablet Take 1 tablet (25 mg total) by mouth daily.  . metroNIDAZOLE (METROCREAM) 0.75 % cream Apply topically 2 (two) times daily.  . Omega-3 Fatty Acids (FISH OIL PO) Take 1 capsule by mouth daily.  . permethrin (ELIMITE) 5 % cream APPLY PEA SIZED AMOUNT TO FACE ONCE A DAY  . warfarin (COUMADIN) 5 MG tablet TAKE 1/2 TO 1 TABLET EVERY DAY AS DIRECTED BY COUMADIN CLINIC  . [DISCONTINUED] carvedilol (COREG) 3.125 MG tablet TAKE 1 TABLET BY MOUTH TWICE A DAY  . amoxicillin-clavulanate (AUGMENTIN) 875-125 MG tablet Take 1 tablet by  mouth 2 (two) times daily for 10 days.  . predniSONE (DELTASONE) 20 MG tablet Take 1 tablet (20 mg total) by mouth daily with breakfast.  . traZODone (DESYREL) 50 MG tablet Take 0.5-1 tablets (25-50 mg total) by mouth at bedtime as needed for sleep.  . [DISCONTINUED] colchicine 0.6 MG tablet Take 0.5-1 tablets (0.3-0.6 mg total) by mouth daily.  . [DISCONTINUED] doxycycline (VIBRA-TABS) 100 MG tablet Take 1 tablet (100 mg total) by mouth 2 (two) times daily.  . [DISCONTINUED] guaiFENesin-codeine (CHERATUSSIN AC) 100-10 MG/5ML syrup Take 5 mLs by mouth 2 (two) times daily as needed for cough (sedation precautions).  . [DISCONTINUED] levothyroxine (SYNTHROID, LEVOTHROID) 25 MCG  tablet Take 2 tablets (50 mcg total) by mouth daily before breakfast.  . [DISCONTINUED] tolterodine (DETROL LA) 4 MG 24 hr capsule Take 1 capsule (4 mg total) by mouth daily.   No facility-administered encounter medications on file as of 11/07/2018.

## 2018-11-07 NOTE — Telephone Encounter (Signed)
changed

## 2018-11-09 ENCOUNTER — Other Ambulatory Visit (INDEPENDENT_AMBULATORY_CARE_PROVIDER_SITE_OTHER): Payer: Medicare HMO

## 2018-11-09 DIAGNOSIS — R5383 Other fatigue: Secondary | ICD-10-CM

## 2018-11-09 LAB — T3, FREE: T3, Free: 2.9 pg/mL (ref 2.3–4.2)

## 2018-11-09 LAB — T4, FREE: Free T4: 0.93 ng/dL (ref 0.60–1.60)

## 2018-11-13 ENCOUNTER — Other Ambulatory Visit: Payer: Self-pay | Admitting: Internal Medicine

## 2018-11-13 DIAGNOSIS — Z872 Personal history of diseases of the skin and subcutaneous tissue: Secondary | ICD-10-CM | POA: Diagnosis not present

## 2018-11-13 DIAGNOSIS — L821 Other seborrheic keratosis: Secondary | ICD-10-CM | POA: Diagnosis not present

## 2018-11-13 DIAGNOSIS — Z859 Personal history of malignant neoplasm, unspecified: Secondary | ICD-10-CM | POA: Diagnosis not present

## 2018-11-13 DIAGNOSIS — J438 Other emphysema: Secondary | ICD-10-CM

## 2018-11-13 DIAGNOSIS — L578 Other skin changes due to chronic exposure to nonionizing radiation: Secondary | ICD-10-CM | POA: Diagnosis not present

## 2018-11-15 ENCOUNTER — Other Ambulatory Visit: Payer: Self-pay

## 2018-11-15 ENCOUNTER — Ambulatory Visit
Admission: RE | Admit: 2018-11-15 | Discharge: 2018-11-15 | Disposition: A | Discharge status: Home / Self Care | Payer: Medicare HMO | Source: Ambulatory Visit | Attending: Family Medicine | Admitting: Family Medicine

## 2018-11-15 DIAGNOSIS — N6323 Unspecified lump in the left breast, lower outer quadrant: Secondary | ICD-10-CM | POA: Diagnosis not present

## 2018-11-15 DIAGNOSIS — N6320 Unspecified lump in the left breast, unspecified quadrant: Secondary | ICD-10-CM

## 2018-11-15 DIAGNOSIS — N644 Mastodynia: Secondary | ICD-10-CM | POA: Diagnosis not present

## 2018-11-30 ENCOUNTER — Telehealth: Payer: Self-pay

## 2018-11-30 NOTE — Telephone Encounter (Signed)

## 2018-12-03 ENCOUNTER — Telehealth: Payer: Self-pay | Admitting: Family Medicine

## 2018-12-03 ENCOUNTER — Other Ambulatory Visit: Payer: Self-pay

## 2018-12-03 ENCOUNTER — Ambulatory Visit (INDEPENDENT_AMBULATORY_CARE_PROVIDER_SITE_OTHER): Payer: Medicare HMO

## 2018-12-03 DIAGNOSIS — Z7901 Long term (current) use of anticoagulants: Secondary | ICD-10-CM | POA: Diagnosis not present

## 2018-12-03 DIAGNOSIS — Z5181 Encounter for therapeutic drug level monitoring: Secondary | ICD-10-CM

## 2018-12-03 DIAGNOSIS — I4891 Unspecified atrial fibrillation: Secondary | ICD-10-CM

## 2018-12-03 LAB — POCT INR: INR: 1.6 — AB (ref 2.0–3.0)

## 2018-12-03 NOTE — Telephone Encounter (Signed)
Left message asking pt to call office  Per dr copland  Please change appointment to [facility].  Pt should not come in office

## 2018-12-03 NOTE — Patient Instructions (Signed)
Please take 1 whole tablet tonight, 1.5 tablets tomorrow night, then START NEW DOSAGE of 1 tablet daily except 1/2 tablet on Mondays and Fridays. Recheck in 4 weeks.

## 2018-12-04 NOTE — Progress Notes (Signed)
[name] T. [name], MD Primary Care and [place] at [facility] [name], [address] Phone: [phone]  FAX: [phone]  XIA STOHR - 73 y.o. female  MRN [id number]  Date of Birth: 04/13/1946  Visit Date: 12/05/2018  PCP: Owens Loffler, MD  Referred by: Owens Loffler, MD  Virtual Visit via Video Note:  I connected with  [name] on 12/05/2018  9:20 AM EDT by a video enabled telemedicine application and verified that I am speaking with the correct person using two identifiers.   Location patient: home phone or cell phone Location provider: work or home office Consent: Verbal consent directly obtained from [name]. Persons participating in the virtual visit: patient, provider  I discussed the limitations of evaluation and management by telemedicine and the availability of in person appointments. The patient expressed understanding and agreed to proceed.  History of Present Illness:  F/u multiple medical problems including ongoing sinus difficulties, neck and back pain.  She is really doing much better compared to the last time that I saw her 1 month ago.  Her antibiotics as well as her prednisone cleared up most of her sinus difficulties and the sinus pain.  She is still having some postnasal drainage and a little bit of some runny nose and discomfort with ongoing allergies.  Her neck pain is improved and she is asymptomatic at this point.  She does have some ongoing knee and back pain which is at her baseline.  She is managing this the best she can with some Tylenol.  Overall she is doing much better than the last time I spoke to her  Review of Systems as above: See pertinent positives and pertinent negatives per HPI No acute distress verbally  Past Medical History, Surgical History, Social History, Family History, Problem List, Medications, and Allergies have been reviewed and updated if relevant.   Observations/Objective/Exam:  An attempt was made to discern vital signs over the phone and per patient if applicable and possible.   General:    Alert, Oriented, appears well and in no acute distress HEENT:     Atraumatic, conjunctiva clear, no obvious abnormalities on inspection of external nose and ears.  Neck:    Normal movements of the head and neck Pulmonary:     On inspection no signs of respiratory distress, breathing rate appears normal, no obvious gross SOB, gasping or wheezing Cardiovascular:    No obvious cyanosis Musculoskeletal:    Moves all visible extremities without noticeable abnormality Psych / Neurological:     Pleasant and cooperative, no obvious depression or anxiety, speech and thought processing grossly intact  Assessment and Plan:  Seasonal allergic rhinitis due to pollen  Chronic neck pain  Low back pain with radiation  Shortness of breath  Primary insomnia  Globally she is doing much better, all of her conditions are dramatically improved.  Her mood is stable and she is doing fine from that standpoint.  She is still having some allergy symptoms, but her sinus pressure and pain is doing dramatically better.  She does have some Flonase at home, so I recommended that she do this.  Also recommended that she get some over-the-counter Allegra for overall allergy management.  I discussed the assessment and treatment plan with the patient. The patient was provided an opportunity to ask questions and all were answered. The patient agreed with the plan and demonstrated an understanding of the instructions.   The  patient was advised to call back or seek an in-person evaluation if the symptoms worsen or if the condition fails to improve as anticipated.  Follow-up: prn unless noted otherwise below No follow-ups on file.  No orders of the defined types were placed in this encounter.  No orders of the defined types were placed in this encounter.   Signed,   Maud Deed. Man Bonneau, MD

## 2018-12-05 ENCOUNTER — Encounter: Payer: Self-pay | Admitting: Family Medicine

## 2018-12-05 ENCOUNTER — Telehealth: Payer: Self-pay | Admitting: Family Medicine

## 2018-12-05 ENCOUNTER — Ambulatory Visit (INDEPENDENT_AMBULATORY_CARE_PROVIDER_SITE_OTHER): Payer: Medicare HMO | Admitting: Family Medicine

## 2018-12-05 VITALS — Temp 97.6°F | Ht 67.5 in | Wt 158.5 lb

## 2018-12-05 DIAGNOSIS — M542 Cervicalgia: Secondary | ICD-10-CM | POA: Diagnosis not present

## 2018-12-05 DIAGNOSIS — R69 Illness, unspecified: Secondary | ICD-10-CM | POA: Diagnosis not present

## 2018-12-05 DIAGNOSIS — M545 Low back pain, unspecified: Secondary | ICD-10-CM

## 2018-12-05 DIAGNOSIS — G8929 Other chronic pain: Secondary | ICD-10-CM

## 2018-12-05 DIAGNOSIS — J301 Allergic rhinitis due to pollen: Secondary | ICD-10-CM

## 2018-12-05 DIAGNOSIS — F5101 Primary insomnia: Secondary | ICD-10-CM

## 2018-12-05 DIAGNOSIS — R0602 Shortness of breath: Secondary | ICD-10-CM | POA: Diagnosis not present

## 2018-12-05 DIAGNOSIS — M5440 Lumbago with sciatica, unspecified side: Secondary | ICD-10-CM | POA: Diagnosis not present

## 2018-12-05 MED ORDER — FLUTICASONE PROPIONATE 50 MCG/ACT NA SUSP: 2.0000 | Freq: Every day | NASAL | 3 refills | Status: DC

## 2018-12-05 NOTE — Telephone Encounter (Signed)
Pt want a prescription for   Fluticasone propionate nasal spray   Sent to [facility]

## 2018-12-05 NOTE — Telephone Encounter (Signed)
Prescription sent as instructed by Dr. Lorelei Pont.

## 2018-12-08 ENCOUNTER — Other Ambulatory Visit: Payer: Self-pay | Admitting: Family Medicine

## 2018-12-18 ENCOUNTER — Ambulatory Visit: Payer: Medicare HMO

## 2018-12-28 ENCOUNTER — Telehealth: Payer: Self-pay

## 2018-12-28 NOTE — Telephone Encounter (Signed)

## 2018-12-31 ENCOUNTER — Ambulatory Visit (INDEPENDENT_AMBULATORY_CARE_PROVIDER_SITE_OTHER): Payer: Medicare HMO

## 2018-12-31 ENCOUNTER — Other Ambulatory Visit: Payer: Self-pay

## 2018-12-31 DIAGNOSIS — Z5181 Encounter for therapeutic drug level monitoring: Secondary | ICD-10-CM | POA: Diagnosis not present

## 2018-12-31 DIAGNOSIS — Z7901 Long term (current) use of anticoagulants: Secondary | ICD-10-CM | POA: Diagnosis not present

## 2018-12-31 DIAGNOSIS — I4891 Unspecified atrial fibrillation: Secondary | ICD-10-CM

## 2018-12-31 LAB — POCT INR: INR: 1.9 — AB (ref 2.0–3.0)

## 2018-12-31 NOTE — Patient Instructions (Signed)
Please START NEW DOSAGE of 1 tablet daily. Recheck in 4 weeks.

## 2019-01-07 ENCOUNTER — Other Ambulatory Visit: Payer: Medicare HMO

## 2019-01-07 ENCOUNTER — Encounter: Payer: Self-pay | Admitting: Family Medicine

## 2019-01-07 ENCOUNTER — Ambulatory Visit (INDEPENDENT_AMBULATORY_CARE_PROVIDER_SITE_OTHER): Payer: Medicare HMO | Admitting: Family Medicine

## 2019-01-07 VITALS — Ht 67.5 in

## 2019-01-07 DIAGNOSIS — R300 Dysuria: Secondary | ICD-10-CM

## 2019-01-07 LAB — POC URINALSYSI DIPSTICK (AUTOMATED)
Bilirubin, UA: NEGATIVE
Blood, UA: NEGATIVE
Glucose, UA: NEGATIVE
Ketones, UA: NEGATIVE
Leukocytes, UA: NEGATIVE
Nitrite, UA: NEGATIVE
Protein, UA: NEGATIVE
Spec Grav, UA: >=1.03 — AB (ref 1.010–1.025)
Urobilinogen, UA: 0.2 E.U./dL
pH, UA: 6 (ref 5.0–8.0)

## 2019-01-07 MED ORDER — NITROFURANTOIN MONOHYD MACRO 100 MG PO CAPS: 100.0000 mg | ORAL_CAPSULE | Freq: Two times a day (BID) | ORAL | 0 refills | Status: AC

## 2019-01-07 MED ORDER — PHENAZOPYRIDINE HCL 95 MG PO TABS: 95.0000 mg | ORAL_TABLET | Freq: Three times a day (TID) | ORAL | 0 refills | Status: DC | PRN

## 2019-01-07 NOTE — Progress Notes (Signed)
Armando Bukhari T. Adriena Manfre, MD Primary Care and [place] at [facility] [place], [address] Phone: [phone]  FAX: [phone]  [name] - 73 y.o. female  MRN [id number]  Date of Birth: 1946/07/13  Visit Date: 01/07/2019  PCP: Owens Loffler, MD  Referred by: Owens Loffler, MD Chief Complaint  Patient presents with  . Dysuria   Virtual Visit via Video Note:  I connected with  [name] on 01/07/2019  3:20 PM EDT by a video enabled telemedicine application and verified that I am speaking with the correct person using two identifiers.   Location patient: home computer, tablet, or smartphone Location provider: work or home office Consent: Verbal consent directly obtained from [name]. Persons participating in the virtual visit: patient, provider  I discussed the limitations of evaluation and management by telemedicine and the availability of in person appointments. The patient expressed understanding and agreed to proceed.  History of Present Illness:  She is been having some burning, dysuria and urgency.  She is eating and drinking normally.  She does not have any systemic fever back pain.  She has been taking AZO  Review of Systems as above: See pertinent positives and pertinent negatives per HPI No acute distress verbally  Past Medical History, Surgical History, Social History, Family History, Problem List, Medications, and Allergies have been reviewed and updated if relevant.   Observations/Objective/Exam:  An attempt was made to discern vital signs over the phone and per patient if applicable and possible.   General:    Alert, Oriented, appears well and in no acute distress HEENT:     Atraumatic, conjunctiva clear, no obvious abnormalities on inspection of external nose and ears.  Neck:    Normal movements of the head and neck Pulmonary:     On inspection no signs of respiratory distress,  breathing rate appears normal, no obvious gross SOB, gasping or wheezing Cardiovascular:    No obvious cyanosis Musculoskeletal:    Moves all visible extremities without noticeable abnormality Psych / Neurological:     Pleasant and cooperative, no obvious depression or anxiety, speech and thought processing grossly intact  Results for orders placed or performed in visit on 01/07/19  POCT Urinalysis Dipstick (Automated)  Result Value Ref Range   Color, UA Dk Yellow    Clarity, UA Clear    Glucose, UA Negative Negative   Bilirubin, UA Negative    Ketones, UA Negative    Spec Grav, UA >=1.030 (A) 1.010 - 1.025   Blood, UA Negative    pH, UA 6.0 5.0 - 8.0   Protein, UA Negative Negative   Urobilinogen, UA 0.2 0.2 or 1.0 E.U./dL   Nitrite, UA Negative    Leukocytes, UA Negative Negative     Assessment and Plan:  Dysuria - Plan: POCT Urinalysis Dipstick (Automated), Urine Culture  >10 minutes spent in face to face time with patient, >50% spent in counselling or coordination of care   Suspect AZO inh UA Check cx  I discussed the assessment and treatment plan with the patient. The patient was provided an opportunity to ask questions and all were answered. The patient agreed with the plan and demonstrated an understanding of the instructions.   The patient was advised to call back or seek an in-person evaluation if the symptoms worsen or if the condition fails to improve as anticipated.  Follow-up: prn unless noted otherwise below No follow-ups on file.  Meds  ordered this encounter  Medications  . nitrofurantoin, macrocrystal-monohydrate, (MACROBID) 100 MG capsule    Sig: Take 1 capsule (100 mg total) by mouth 2 (two) times daily for 7 days.    Dispense:  14 capsule    Refill:  0   Orders Placed This Encounter  Procedures  . Urine Culture  . POCT Urinalysis Dipstick (Automated)    Signed,  Ewing Fandino T. Dachelle Molzahn, MD

## 2019-01-08 LAB — URINE CULTURE
MICRO NUMBER:: 462443
SPECIMEN QUALITY:: ADEQUATE

## 2019-01-25 ENCOUNTER — Telehealth: Payer: Self-pay

## 2019-01-25 NOTE — Telephone Encounter (Signed)

## 2019-01-28 ENCOUNTER — Other Ambulatory Visit: Payer: Self-pay

## 2019-01-28 ENCOUNTER — Ambulatory Visit: Payer: Medicare HMO

## 2019-01-28 DIAGNOSIS — Z5181 Encounter for therapeutic drug level monitoring: Secondary | ICD-10-CM

## 2019-01-28 DIAGNOSIS — M25562 Pain in left knee: Secondary | ICD-10-CM | POA: Diagnosis not present

## 2019-01-28 DIAGNOSIS — I4891 Unspecified atrial fibrillation: Secondary | ICD-10-CM

## 2019-01-28 DIAGNOSIS — Z7901 Long term (current) use of anticoagulants: Secondary | ICD-10-CM

## 2019-01-28 LAB — POCT INR: INR: 2.7 (ref 2.0–3.0)

## 2019-01-28 NOTE — Patient Instructions (Signed)
Please continue dosage of 1 tablet daily. Recheck in 5 weeks.

## 2019-01-31 ENCOUNTER — Ambulatory Visit: Payer: Medicare HMO

## 2019-02-01 ENCOUNTER — Telehealth: Payer: Self-pay | Admitting: Internal Medicine

## 2019-02-01 NOTE — Telephone Encounter (Signed)
Called pt to cancel 02/04/2019 appt. Pt has to be rescheduled for PFT first and then office visit can be scheduled.

## 2019-02-04 ENCOUNTER — Other Ambulatory Visit: Payer: Self-pay | Admitting: Family Medicine

## 2019-02-04 ENCOUNTER — Ambulatory Visit: Payer: Medicare HMO | Admitting: Internal Medicine

## 2019-02-08 ENCOUNTER — Other Ambulatory Visit: Payer: Self-pay

## 2019-02-15 ENCOUNTER — Other Ambulatory Visit: Payer: Self-pay

## 2019-02-15 ENCOUNTER — Other Ambulatory Visit
Admission: RE | Admit: 2019-02-15 | Discharge: 2019-02-15 | Disposition: A | Discharge status: Home / Self Care | Payer: Medicare HMO | Source: Ambulatory Visit | Attending: Internal Medicine | Admitting: Internal Medicine

## 2019-02-15 DIAGNOSIS — Z1159 Encounter for screening for other viral diseases: Secondary | ICD-10-CM | POA: Diagnosis not present

## 2019-02-16 LAB — NOVEL CORONAVIRUS, NAA (HOSP ORDER, SEND-OUT TO REF LAB; TAT 18-24 HRS): SARS-CoV-2, NAA: NOT DETECTED

## 2019-02-20 ENCOUNTER — Ambulatory Visit: Payer: Medicare HMO | Attending: Internal Medicine

## 2019-02-20 ENCOUNTER — Other Ambulatory Visit: Payer: Self-pay

## 2019-02-20 DIAGNOSIS — J438 Other emphysema: Secondary | ICD-10-CM | POA: Insufficient documentation

## 2019-02-20 MED ORDER — ALBUTEROL SULFATE (2.5 MG/3ML) 0.083% IN NEBU
2.5000 mg | INHALATION_SOLUTION | Freq: Once | RESPIRATORY_TRACT | Status: AC
  Administered 2019-02-20: 09:00:00 2.5 mg via RESPIRATORY_TRACT
  Filled 2019-02-20: qty 3

## 2019-02-26 NOTE — Progress Notes (Signed)
Deborah Ryles T. Piper Hassebrock, MD Primary Care and [place] at [facility] [place], [address] Phone: [phone]  FAX: [phone]  [name] - 73 y.o. female  MRN [id number]  Date of Birth: 1946/02/13  Visit Date: 02/27/2019  PCP: Owens Loffler, MD  Referred by: Owens Loffler, MD  Chief Complaint  Patient presents with  . Knee Pain    Bilateral   Subjective:   [name] is a 73 y.o. very pleasant female patient who presents with the following:  She is a pleasant well-known patient who presents with bilateral knee pain.  She does have known bilateral knee osteoarthritis.  She is chronically anticoagulated on Coumadin, and I last injected her knees with corticosteroid approximately 9 months ago.   ? CPPD on plain films - amiodarone + colch interaction unable to do. She is having significant pain right now bilateral knees.  She is on Coumadin chronically limited on what she can take orally.  She also got struck in her leg by 1 of her roosters a few days ago, and there is some surrounding itching and redness.  L leg chicken / rooster spur Rooster  B knee inj   Immunization History  Administered Date(s) Administered  . Influenza Split 05/11/2011  . Influenza Whole 06/30/2010  . Influenza, High Dose Seasonal PF 06/27/2015, 05/01/2018  . Influenza,inj,Quad PF,6+ Mos 07/29/2013, 05/27/2016, 05/16/2017  . Pneumococcal Conjugate-13 08/26/2015  . Pneumococcal Polysaccharide-23 04/05/2010  . Td 02/27/2019  . Zoster Recombinat (Shingrix) 03/29/2018, 08/25/2018     Past Medical History, Surgical History, Social History, Family History, Problem List, Medications, and Allergies have been reviewed and updated if relevant.  Patient Active Problem List   Diagnosis Date Noted  . Mild dementia ([place]) 10/24/2014    Priority: High  . Chronic combined systolic and diastolic CHF, NYHA class 1 ([place]) 09/29/2014   Priority: High  . 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 Generalized anxiety disorder 11/18/2008    Priority: Medium  . Major depressive disorder, recurrent episode, moderate with anxious distress ([place]) 11/18/2008    Priority: Medium  . Acute pain of left knee 07/19/2018  . Valvular heart disease 07/12/2018  . Essential hypertension 07/12/2018  . Tick bite of left upper arm 07/06/2018  . Acute respiratory infection 07/06/2018  . Insomnia 12/05/2017  . Primary osteoarthritis of both knees 10/12/2016  . Fracture of left pelvis ([place]) 04/29/2015  . PAF (paroxysmal atrial fibrillation) ([place]) 03/04/2015  . On amiodarone therapy 11/14/2014  . Current use of long term anticoagulation 11/14/2014  . Hyperlipidemia LDL goal <100 04/05/2010  . GERD 04/05/2010  . Hypothyroid 11/18/2008  . ASTHMA 11/18/2008  . Mixed incontinence urge and stress 11/18/2008  . NEPHROLITHIASIS, HX OF 11/18/2008    Past Medical History:  Diagnosis Date  . Acne rosacea   . Anxiety   . Asthma   . Atrial fibrillation and flutter ([place]) 09/2014   Revereted from Afib RVR to 2:1 A Flutter -- TEE/ DCCV 2/9; on Amiodarone  . Chronic combined systolic and diastolic CHF, NYHA class 1 ([facility]) -- Systolic dysfunction [id number][D53.29] 09/2014   Exacerbation 09/2014 2/2 Afib/flutter with RVR - 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 Mod LA dilation, normal PA pressures   .  CVA (cerebral infarction)    h/o superior cerebellar infarct  . Depression   . Digoxin toxicity 09/2014  . 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 Diverticulosis   . H/O: rheumatic fever    Childhood  . HYPERLIPIDEMIA 04/05/2010   Qualifier: Diagnosis of  By: Lorelei Pont MD, Frederico Hamman    . Hypothyroid    On replacement therapy; normal TSH 09/2014  . Migraine headache   . Osteopenia   . Paroxysmal atrial fibrillation ([place]) 2005; Recurrent 09/2014   a) 2005: s/p Cox Maze & LAA ligation; b) 09/2014: TEE-cardioversion  . Rheumatic mitral and aortic valve insufficiency 2005   a) s/p MV Ring repair; with Cox-Maze for Afib; b) Echo 2013: EF 60-65%,no Regional WMA, Gr 2 DD, no Sig MR ;; c) TEE 10/07/2014: Severlely thickened and calcified MV leaflets. Posterior MV leaflet is fixed and immobile. MAC. reduced excursion of the anterior MV leaflet. Mean MV gradient was 34m Hg and MVA calculated at cm2.    . Urinary incontinence     Past Surgical History:  Procedure Laterality Date  . ABDOMINAL HYSTERECTOMY    . CARDIAC CATHETERIZATION  Jan 2005   Pre-op R&LHC -- Nonobstructive CAD; normal PA pressures  . CARDIOVERSION  06/04/2012   Procedure: CARDIOVERSION;  Surgeon: JCarlena Bjornstad MD;  [facility];  Service: Cardiovascular;  Laterality: N/A;  . CARDIOVERSION N/A 10/07/2014   Procedure: CARDIOVERSION;  Surgeon: TSueanne Margarita MD;  [place]  Service: Cardiovascular;  Laterality: N/A;  . COLONOSCOPY WITH PROPOFOL N/A 07/11/2017   Procedure: COLONOSCOPY WITH PROPOFOL;  Surgeon: Tol[place], TBenay Pike MD;  Location: [facility] ENDOSCOPY;  Service: Gastroenterology;  Laterality: N/A;  . LUMBAR DISC SURGERY     x 2 distantly  . MITRAL VALVE REPAIR  2005   with [facility] Maze for [employer] . NM MYOVIEW LTD  4/7/'16   EF 37% with septal hypokinesis and diffuse hypokinesis. No ischemia or infarction. 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  .Marland KitchenRIGHT HEART CATHETERIZATION  N/A 10/03/2014   Procedure: RIGHT HEART CATH;  Surgeon: DJolaine Artist MD;  [facility];  Service: Cardiovascular;  RAP 552mg, RVP 35/2/9 mmHg, PAP 45/12 mmHg, PCWP 16 mmHg; CO/I by Fick: 3.9/2.3; Ao/PA/SVC SaO2%: 97%/63%/65%.  . TEE WITHOUT CARDIOVERSION N/A 10/07/2014   Procedure: TRANSESOPHAGEAL ECHOCARDIOGRAM (TEE);  Surgeon: TrSueanne MargaritaMD;  [facility];  Service: Cardiovascular;  Laterality: N/A;  . THYROIDECTOMY, PARTIAL     partial-no cancer; now on thyroid replacement  . TRANSTHORACIC ECHOCARDIOGRAM  2013; 2/'16 & 5/'16   a) 2013: EF 60-65%, mild MR, Gr 2 DD; [place] - severel global LV dysfunction (systolic & diastolic) - dilated LV & restrictive filling pattern - Gr 3 DD, mod reduced RV function, severely dilated LA - 6.4 cm, mod dilated RA, mod pl effusion, mod MR, mild TR; c) 5/10/'16:  EF 25-30%, mod LV dilation, no RWMA,Gr 3 DD w/ high LAP, MV sewing ring intact w/ Mod MR, Severe LA dilation  . TRANSTHORACIC ECHOCARDIOGRAM  January 2017:   EF improved  to 45-50%. Mild diffuse HK. Mild MR. Moderate LA dilation. Normal PA pressures.    Social History   Socioeconomic History  . Marital status: Married    Spouse name: Not on file  . Number of children: 2  . Years of education: Not on file  . Highest education level: Not on file  Occupational History  . Occupation: Retired  Scientific laboratory technician  . Financial resource strain: Not on file  . Food insecurity    Worry: Not on file    Inability: Not on file  . Transportation needs    Medical: Not on file    Non-medical: Not on file  Tobacco Use  . Smoking status: Never Smoker  . Smokeless tobacco: Never Used  Substance and Sexual Activity  . Alcohol use: Yes    Comment: wine occasionally  . Drug use: No  . Sexual activity: Yes  Lifestyle  . Physical activity    Days per week: Not on file    Minutes per session: Not on file  . Stress: Not on file  Relationships  . Social Product manager on phone: Not on file    Gets together: Not on file    Attends religious service: Not on file    Active member of club or organization: Not on file    Attends meetings of clubs or organizations: Not on file    Relationship status: Not on file  . Intimate partner violence    Fear of current or ex partner: Not on file    Emotionally abused: Not on file    Physically abused: Not on file    Forced sexual activity: Not on file  Other Topics Concern  . Not on file  Social History Narrative  . Not on file    Family History  Problem Relation Age of Onset  . Diabetes Mother   . Coronary artery disease Mother   . Kidney disease Mother   . Breast cancer Neg Hx     No Known Allergies  Medication list reviewed and updated in full in [place].  GEN: No fevers, chills. Nontoxic. Primarily MSK c/o today. MSK: Detailed in the HPI GI: tolerating PO intake without difficulty Neuro: No numbness, parasthesias, or tingling associated. Otherwise the pertinent positives of the ROS are noted above.   Objective   BP 130/74   Pulse (!) 50   Temp 98.1 F (36.7 C) (Temporal)   Ht 5' 7.5" (1.715 m)   Wt 158 lb (71.7 kg)   SpO2 97%   BMI 24.38 kg/m    GEN: WDWN, NAD, Non-toxic, Alert & Oriented x 3 HEENT: Atraumatic, Normocephalic.  Ears and Nose: No external deformity. EXTR: No clubbing/cyanosis/edema NEURO: Normal gait.  PSYCH: Normally interactive. Conversant. Not depressed or anxious appearing.  Calm demeanor.    Both knees: Lacks 2 degrees of extension.  Flexion to 120 degrees.  Minimal effusion.  Minimal pain with movement at the patella.  Medial joint line greater than lateral joint line tenderness.  ACL, PCL, MCL, and LCL are all stable.  Mildly tender with McMurray's and flexion pinch.    Mild redness adjacent to a rooster spur injury.  Radiology: No results found.  Assessment and Plan:     ICD-10-CM   1. Primary osteoarthritis of knees, bilateral  M17.0  methylPREDNISolone acetate (DEPO-MEDROL) injection 80 mg    methylPREDNISolone acetate (DEPO-MEDROL) injection 80 mg  2. Need for tetanus booster  Z23 Td vaccine greater than or  equal to 7yo preservative free IM  3. Struck by [employer], initial encounter  [id number] Td vaccine greater than or equal to 7yo preservative free IM   Arthritis of the knees, cannot exclude CPPD.  Limited oral medication availability on Coumadin.  I am going to do bilateral knee injections on the patient, she has had good response in the past.  She was struck in the leg by a rooster, and unclear if this is a pure reactionary or inflammatory response.  Cannot exclude early infection, some in a place the patient on some Keflex.  Also give the patient a tetanus shot.  Aspiration/Injection Procedure Note CLOTILE WHITTINGTON 12-Feb-1946 Date of procedure: 02/27/2019  Procedure: Large Joint Joint Aspiration / Injection of the Right Knee Indications: Pain  Procedure Details Patient verbally consented to procedure. Risks (including potential rare risk of infection), benefits, and alternatives explained. Sterilely prepped with Chloraprep. Ethyl cholride used for anesthesia. 8 cc Lidocaine 1% mixed with 2 mL Depo-Medrol 40 mg injected using the anteromedial approach without difficulty. No complications with procedure and tolerated well. Patient had decreased pain post-injection.  Medication: 2 mL of Depo-Medrol 40 mg, equaling Depo-Medrol 80 mg total  Aspiration/Injection Procedure Note LAIA WILEY 12-19-1945 Date of procedure: 02/27/2019  Procedure: Large Joint Aspiration / Injection of the Left Knee Indications: Pain  Procedure Details Patient verbally consented to procedure. Risks (including potential rare risk of infection), benefits, and alternatives explained. Sterilely prepped with Chloraprep. Ethyl cholride used for anesthesia. 8 cc Lidocaine 1% mixed with 2 mL Depo-Medrol 40 mg injected using the anteromedial approach  without difficulty. No complications with procedure and tolerated well. Patient had decreased pain post-injection.  Medication: 2 mL of Depo-Medrol 40 mg, equaling Depo-Medrol 80 mg total   Follow-up: when needed  Meds ordered this encounter  Medications  . cephALEXin (KEFLEX) 500 MG capsule    Sig: Take 2 capsules (1,000 mg total) by mouth 2 (two) times daily for 10 days.    Dispense:  40 capsule    Refill:  0  . methylPREDNISolone acetate (DEPO-MEDROL) injection 80 mg  . methylPREDNISolone acetate (DEPO-MEDROL) injection 80 mg   Orders Placed This Encounter  Procedures  . Td vaccine greater than or equal to 7yo preservative free IM    Signed,  Sukanya Goldblatt T. Hennie Gosa, MD   Outpatient Encounter Medications as of 02/27/2019  Medication Sig  . acetaminophen (TYLENOL) 325 MG tablet Take 2 tablets (650 mg total) by mouth every 4 (four) hours as needed for mild pain, fever or headache.  Marland Kitchen amiodarone (PACERONE) 200 MG tablet TAKE 1 TABLET BY MOUTH ONCE A DAY  . b complex vitamins tablet Take 1 tablet daily by mouth.  . Calcium Citrate-Vitamin D (CALCIUM + D PO) Take 1 capsule daily by mouth.   . carvedilol (COREG) 3.125 MG tablet Take 1 tablet (3.125 mg total) by mouth 2 (two) times daily.  Marland Kitchen donepezil (ARICEPT) 5 MG tablet TAKE 1 TABLET BY MOUTH DAILY AT BEDTIME  . fluticasone (FLONASE) 50 MCG/ACT nasal spray Place 2 sprays into both nostrils daily.  . IRON, FERROUS SULFATE, PO Take 1 tablet by mouth daily.  Marland Kitchen levothyroxine (SYNTHROID) 25 MCG tablet Take 25 mcg by mouth daily before breakfast.   . losartan (COZAAR) 25 MG tablet TAKE 1 TABLET BY MOUTH ONCE A DAY  . metroNIDAZOLE (METROCREAM) 0.75 % cream Apply topically 2 (two) times daily.  . Omega-3 Fatty Acids (FISH OIL PO) Take 1 capsule by mouth daily.  . permethrin (ELIMITE)  5 % cream APPLY PEA SIZED AMOUNT TO FACE ONCE A DAY  . phenazopyridine (PYRIDIUM) 95 MG tablet Take 1 tablet (95 mg total) by mouth 3 (three) times daily as  needed for pain.  . traZODone (DESYREL) 50 MG tablet TAKE 1/2 TO 1 TABLET BY MOUTH AT BEDTIMEAS NEEDED FOR SLEEP  . warfarin (COUMADIN) 5 MG tablet TAKE 1/2 TO 1 TABLET EVERY DAY AS DIRECTED BY COUMADIN CLINIC  . cephALEXin (KEFLEX) 500 MG capsule Take 2 capsules (1,000 mg total) by mouth 2 (two) times daily for 10 days.  . [EXPIRED] methylPREDNISolone acetate (DEPO-MEDROL) injection 80 mg   . [EXPIRED] methylPREDNISolone acetate (DEPO-MEDROL) injection 80 mg    No facility-administered encounter medications on file as of 02/27/2019.

## 2019-02-27 ENCOUNTER — Other Ambulatory Visit: Payer: Self-pay

## 2019-02-27 ENCOUNTER — Ambulatory Visit: Payer: Medicare HMO | Admitting: Family Medicine

## 2019-02-27 ENCOUNTER — Encounter: Payer: Self-pay | Admitting: Family Medicine

## 2019-02-27 VITALS — BP 130/74 | HR 50 | Temp 98.1°F | Ht 67.5 in | Wt 158.0 lb

## 2019-02-27 DIAGNOSIS — W6132XA Struck by chicken, initial encounter: Secondary | ICD-10-CM

## 2019-02-27 DIAGNOSIS — S81832A Puncture wound without foreign body, left lower leg, initial encounter: Secondary | ICD-10-CM

## 2019-02-27 DIAGNOSIS — Z23 Encounter for immunization: Secondary | ICD-10-CM | POA: Diagnosis not present

## 2019-02-27 DIAGNOSIS — M170 Bilateral primary osteoarthritis of knee: Secondary | ICD-10-CM

## 2019-02-27 MED ORDER — CEPHALEXIN 500 MG PO CAPS: 1000.0000 mg | ORAL_CAPSULE | Freq: Two times a day (BID) | ORAL | 0 refills | Status: AC

## 2019-02-27 MED ORDER — METHYLPREDNISOLONE ACETATE 40 MG/ML IJ SUSP
80.0000 mg | Freq: Once | INTRAMUSCULAR | Status: AC
  Administered 2019-02-27: 80 mg via INTRA_ARTICULAR

## 2019-03-04 ENCOUNTER — Other Ambulatory Visit: Payer: Self-pay

## 2019-03-04 ENCOUNTER — Telehealth: Payer: Self-pay | Admitting: Family Medicine

## 2019-03-04 ENCOUNTER — Ambulatory Visit (INDEPENDENT_AMBULATORY_CARE_PROVIDER_SITE_OTHER): Payer: Medicare HMO

## 2019-03-04 DIAGNOSIS — Z7901 Long term (current) use of anticoagulants: Secondary | ICD-10-CM | POA: Diagnosis not present

## 2019-03-04 DIAGNOSIS — I4891 Unspecified atrial fibrillation: Secondary | ICD-10-CM

## 2019-03-04 DIAGNOSIS — Z5181 Encounter for therapeutic drug level monitoring: Secondary | ICD-10-CM | POA: Diagnosis not present

## 2019-03-04 LAB — POCT INR: INR: 3.2 — AB (ref 2.0–3.0)

## 2019-03-04 NOTE — Telephone Encounter (Signed)
done

## 2019-03-04 NOTE — Telephone Encounter (Signed)
Handicap Placard Application completed and placed in Dr. [name]'s in box for signature. 

## 2019-03-04 NOTE — Telephone Encounter (Signed)
Left message for [name] that Dr. Lorelei Pont has completed a handicap placard application for her and it is available for pick up at our front desk.  I advised she would take the completed form to the [facility] office to get her placard.

## 2019-03-04 NOTE — Telephone Encounter (Signed)
Patient would like to know if Dr Lorelei Pont is able to help her get a handicap sticker for her car. Patient stated that her knees have been giving her a lot of problems and having a hard time walking because of them. She stated that today when at a store she about did not make it to the store since she had to park a good ways away from the store.    Patient's C/B # [id number]

## 2019-03-04 NOTE — Patient Instructions (Signed)
Please have a serving of greens today, take 2.5 mg today, then continue dosage of 1 tablet daily. Recheck in 6 weeks.

## 2019-03-05 NOTE — Telephone Encounter (Signed)
Pt is aware and will be in today.

## 2019-03-12 ENCOUNTER — Other Ambulatory Visit: Payer: Medicare HMO

## 2019-03-12 ENCOUNTER — Ambulatory Visit (INDEPENDENT_AMBULATORY_CARE_PROVIDER_SITE_OTHER): Payer: Medicare HMO | Admitting: Family Medicine

## 2019-03-12 ENCOUNTER — Encounter: Payer: Self-pay | Admitting: Family Medicine

## 2019-03-12 VITALS — BP 136/81 | HR 54 | Temp 97.2°F | Wt 154.1 lb

## 2019-03-12 DIAGNOSIS — K1379 Other lesions of oral mucosa: Secondary | ICD-10-CM

## 2019-03-12 DIAGNOSIS — R200 Anesthesia of skin: Secondary | ICD-10-CM | POA: Diagnosis not present

## 2019-03-12 DIAGNOSIS — R350 Frequency of micturition: Secondary | ICD-10-CM

## 2019-03-12 DIAGNOSIS — N3000 Acute cystitis without hematuria: Secondary | ICD-10-CM | POA: Insufficient documentation

## 2019-03-12 DIAGNOSIS — R81 Glycosuria: Secondary | ICD-10-CM | POA: Diagnosis not present

## 2019-03-12 LAB — POCT URINALYSIS DIPSTICK
Blood, UA: NEGATIVE
Glucose, UA: POSITIVE — AB
Nitrite, UA: POSITIVE
Protein, UA: POSITIVE — AB
Spec Grav, UA: 1.025 (ref 1.010–1.025)
Urobilinogen, UA: 2 E.U./dL — AB
pH, UA: 5 (ref 5.0–8.0)

## 2019-03-12 MED ORDER — VALACYCLOVIR HCL 1 G PO TABS: 1000.0000 mg | ORAL_TABLET | Freq: Three times a day (TID) | ORAL | 0 refills | Status: DC

## 2019-03-12 MED ORDER — CEPHALEXIN 500 MG PO CAPS: 500.0000 mg | ORAL_CAPSULE | Freq: Three times a day (TID) | ORAL | 0 refills | Status: DC

## 2019-03-12 NOTE — Assessment & Plan Note (Signed)
Treat with cephalexin given on anticoagulant.  Increase water. Send for culture.

## 2019-03-12 NOTE — Patient Instructions (Addendum)
Complete 7 days of antibiotics.  Call if symptom are not gone at end of the course.  Increase water.  Possible shingles... complete valacyclovir. Call if not improving in 2 weeks as expected.  Set up labs to evaluate numbness.   Shingles  Shingles, which is also known as herpes zoster, is an infection that causes a painful skin rash and fluid-filled blisters. It is caused by a virus. Shingles only develops in people who:  Have had chickenpox.  Have been given a medicine to protect against chickenpox (have been vaccinated). Shingles is rare in this group. What are the causes? Shingles is caused by varicella-zoster virus (VZV). This is the same virus that causes chickenpox. After a person is exposed to VZV, the virus stays in the body in an inactive (dormant) state. Shingles develops if the virus is reactivated. This can happen many years after the first (initial) exposure to VZV. It is not known what causes this virus to be reactivated. What increases the risk? People who have had chickenpox or received the chickenpox vaccine are at risk for shingles. Shingles infection is more common in people who:  Are older than age 74.  Have a weakened disease-fighting system (immune system), such as people with: ? HIV. ? AIDS. ? Cancer.  Are taking medicines that weaken the immune system, such as transplant medicines.  Are experiencing a lot of stress. What are the signs or symptoms? Early symptoms of this condition include itching, tingling, and pain in an area on your skin. Pain may be described as burning, stabbing, or throbbing. A few days or weeks after early symptoms start, a painful red rash appears. The rash is usually on one side of the body and has a band-like or belt-like pattern. The rash eventually turns into fluid-filled blisters that break open, change into scabs, and dry up in about 2-3 weeks. At any time during the infection, you may also develop:  A fever.  Chills.  A  headache.  An upset stomach. How is this diagnosed? This condition is diagnosed with a skin exam. Skin or fluid samples may be taken from the blisters before a diagnosis is made. These samples are examined under a microscope or sent to a lab for testing. How is this treated? The rash may last for several weeks. There is not a specific cure for this condition. Your health care provider will probably prescribe medicines to help you manage pain, recover more quickly, and avoid long-term problems. Medicines may include:  Antiviral drugs.  Anti-inflammatory drugs.  Pain medicines.  Anti-itching medicines (antihistamines). If the area involved is on your face, you may be referred to a specialist, such as an eye doctor (ophthalmologist) or an ear, nose, and throat (ENT) doctor (otolaryngologist) to help you avoid eye problems, chronic pain, or disability. Follow these instructions at home: Medicines  Take over-the-counter and prescription medicines only as told by your health care provider.  Apply an anti-itch cream or numbing cream to the affected area as told by your health care provider. Relieving itching and discomfort   Apply cold, wet cloths (cold compresses) to the area of the rash or blisters as told by your health care provider.  Cool baths can be soothing. Try adding baking soda or dry oatmeal to the water to reduce itching. Do not bathe in hot water. Blister and rash care  Keep your rash covered with a loose bandage (dressing). Wear loose-fitting clothing to help ease the pain of material rubbing against the rash.  Keep your rash and blisters clean by washing the area with mild soap and cool water as told by your health care provider.  Check your rash every day for signs of infection. Check for: ? More redness, swelling, or pain. ? Fluid or blood. ? Warmth. ? Pus or a bad smell.  Do not scratch your rash or pick at your blisters. To help avoid scratching: ? Keep your  fingernails clean and cut short. ? Wear gloves or mittens while you sleep, if scratching is a problem. General instructions  Rest as told by your health care provider.  Keep all follow-up visits as told by your health care provider. This is important.  Wash your hands often with soap and water. If soap and water are not available, use hand sanitizer. Doing this lowers your chance of getting a bacterial skin infection.  Before your blisters change into scabs, your shingles infection can cause chickenpox in people who have never had it or have never been vaccinated against it. To prevent this from happening, avoid contact with other people, especially: ? Babies. ? Pregnant women. ? Children who have eczema. ? Elderly people who have transplants. ? People who have chronic illnesses, such as cancer or AIDS. Contact a health care provider if:  Your pain is not relieved with prescribed medicines.  Your pain does not get better after the rash heals.  You have signs of infection in the rash area, such as: ? More redness, swelling, or pain around the rash. ? Fluid or blood coming from the rash. ? The rash area feeling warm to the touch. ? Pus or a bad smell coming from the rash. Get help right away if:  The rash is on your face or nose.  You have facial pain, pain around your eye area, or loss of feeling on one side of your face.  You have difficulty seeing.  You have ear pain or have ringing in your ear.  You have a loss of taste.  Your condition gets worse. Summary  Shingles, which is also known as herpes zoster, is an infection that causes a painful skin rash and fluid-filled blisters.  This condition is diagnosed with a skin exam. Skin or fluid samples may be taken from the blisters and examined before the diagnosis is made.  Keep your rash covered with a loose bandage (dressing). Wear loose-fitting clothing to help ease the pain of material rubbing against the  rash.  Before your blisters change into scabs, your shingles infection can cause chickenpox in people who have never had it or have never been vaccinated against it. This information is not intended to replace advice given to you by your health care provider. Make sure you discuss any questions you have with your health care provider. Document Released: 08/15/2005 Document Revised: 12/07/2018 Document Reviewed: 04/19/2017 Elsevier Patient Education  2020 [employer].

## 2019-03-12 NOTE — Progress Notes (Signed)
VIRTUAL VISIT Due to national recommendations of social distancing due to [place] 19, a virtual visit is felt to be most appropriate for this patient at this time.   I connected with the patient on 03/12/19 at  4:20 PM EDT by virtual telehealth platform and verified that I am speaking with the correct person using two identifiers.   I discussed the limitations, risks, security and privacy concerns of performing an evaluation and management service by  virtual telehealth platform and the availability of in person appointments. I also discussed with the patient that there may be a patient responsible charge related to this service. The patient expressed understanding and agreed to proceed.  Patient location: Home Provider Location: [place] Participants: Eliezer Lofts and [name]   Chief Complaint  Patient presents with  . Urinary Tract Infection    Burning, urgency/ lower back hurts/ sore roof of mouth/ lip numbness on right side/ took AZO this morning    History of Present Illness: 73 year old female pt of Dr. Lillie Fragmin with history of afib. HTN, CHF, MDD/GADpresents with multiple new issues  1.Dysuria x several days, wore in last 24 hours, increased frequency, urgency.  NO hematuria  same amount each urination  Low back pain, bilateral. No flank pain no abd pain, no fever, no N/V  Azo.. helped some.  2.new soreness on roof of mouth  In last 24 hours.. no visual change. Food irritates it. Not sure if burned her mouth.  3.lip numbness on right in last 24 hours.. no other new neuro , top and bottom.  Felt like mild tingling, no pain  can feel lip  glucose seen in urine... ? New diagnosis DM   no new numbness, no new weakness, no slurred speech, no confusion.   She has had shingles vaccine.   COVID 19 screen No recent travel or known exposure to COVID19 The patient denies respiratory symptoms of COVID 19 at this time.  The importance of social distancing was  discussed today.   Review of Systems  Constitutional: Negative for chills and fever.  HENT: Negative for congestion and ear pain.   Eyes: Negative for pain and redness.  Respiratory: Negative for cough and shortness of breath.   Cardiovascular: Negative for chest pain, palpitations and leg swelling.  Gastrointestinal: Negative for abdominal pain, blood in stool, constipation, diarrhea, nausea and vomiting.  Genitourinary: Positive for dysuria, frequency and urgency. Negative for flank pain.  Musculoskeletal: Negative for falls and myalgias.  Skin: Negative for rash.  Neurological: Negative for dizziness.  Psychiatric/Behavioral: Negative for depression. The patient is not nervous/anxious.       Past Medical History:  Diagnosis Date  . Acne rosacea   . Anxiety   . Asthma   . Atrial fibrillation and flutter ([place]) 09/2014   Revereted from Afib RVR to 2:1 A Flutter -- TEE/ DCCV 2/9; on Amiodarone  . 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 Mod LA dilation, normal PA pressures   . CVA (cerebral infarction)    h/o superior cerebellar infarct  . Depression   . Digoxin toxicity 09/2014  . 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 Diverticulosis   . H/O: rheumatic fever    Childhood  . HYPERLIPIDEMIA 04/05/2010   Qualifier: Diagnosis of  By: Lorelei Pont MD, Frederico Hamman    . Hypothyroid    On replacement therapy; normal TSH 09/2014  . Migraine headache   .  Osteopenia   . Paroxysmal atrial fibrillation ([place]) 2005; Recurrent 09/2014   a) 2005: s/p Cox Maze & LAA ligation; b) 09/2014: TEE-cardioversion  . Rheumatic mitral and aortic valve insufficiency 2005   a) s/p MV Ring repair; with Cox-Maze for Afib; b) Echo 2013: EF 60-65%,no Regional WMA, Gr 2 DD, no Sig MR ;; c) TEE 10/07/2014: Severlely thickened and calcified MV leaflets. Posterior MV leaflet is fixed and immobile. MAC. reduced excursion of the anterior MV leaflet. Mean MV gradient was 43m Hg and MVA calculated at cm2.    . Urinary incontinence     reports that she has never smoked. She has never used smokeless tobacco. She reports current alcohol use. She reports that she does not use drugs.   Current Outpatient Medications:  .  acetaminophen (TYLENOL) 325 MG tablet, Take 2 tablets (650 mg total) by mouth every 4 (four) hours as needed for mild pain, fever or headache., Disp: 30 tablet, Rfl: 0 .  amiodarone (PACERONE) 200 MG tablet, TAKE 1 TABLET BY MOUTH ONCE A DAY, Disp: 90 tablet, Rfl: 3 .  b complex vitamins tablet, Take 1 tablet daily by mouth., Disp: , Rfl:  .  Calcium Citrate-Vitamin D (CALCIUM + D PO), Take 1 capsule daily by mouth. , Disp: , Rfl:  .  carvedilol (COREG) 3.125 MG tablet, Take 1 tablet (3.125 mg total) by mouth 2 (two) times daily., Disp: 180 tablet, Rfl: 1 .  donepezil (ARICEPT) 5 MG tablet, TAKE 1 TABLET BY MOUTH DAILY AT BEDTIME, Disp: 90 tablet, Rfl: 3 .  fluticasone (FLONASE) 50 MCG/ACT nasal spray, Place 2 sprays into both nostrils daily., Disp: 48 g, Rfl: 3 .  IRON, FERROUS SULFATE, PO, Take 1 tablet by mouth daily., Disp: , Rfl:  .  levothyroxine (SYNTHROID) 25 MCG tablet, Take 25 mcg by mouth daily before breakfast. , Disp: , Rfl:  .  losartan (COZAAR) 25 MG tablet, TAKE 1 TABLET BY MOUTH ONCE A DAY, Disp: 90 tablet, Rfl: 1 .  metroNIDAZOLE (METROCREAM) 0.75 % cream, Apply topically 2 (two) times daily., Disp: 45 g, Rfl: 5 .  Omega-3 Fatty Acids (FISH OIL  PO), Take 1 capsule by mouth daily., Disp: , Rfl:  .  permethrin (ELIMITE) 5 % cream, APPLY PEA SIZED AMOUNT TO FACE ONCE A DAY, Disp: , Rfl: 1 .  phenazopyridine (PYRIDIUM) 95 MG tablet, Take 1 tablet (95 mg total) by mouth 3 (three) times daily as needed for pain., Disp: 10 tablet, Rfl: 0 .  traZODone (DESYREL) 50 MG tablet, TAKE 1/2 TO 1 TABLET BY MOUTH AT BEDTIMEAS NEEDED FOR SLEEP, Disp: 30 tablet, Rfl: 2 .  warfarin (COUMADIN) 5 MG tablet, TAKE 1/2 TO 1 TABLET EVERY DAY AS DIRECTED BY COUMADIN CLINIC, Disp: 90 tablet, Rfl: 1   Observations/Objective: Blood pressure 136/81, pulse (!) 54, temperature (!) 97.2 F (36.2 C), weight 154 lb 1.6 oz (69.9 kg).  Physical Exam  Physical Exam Constitutional:      General: The patient is not  in acute distress. Pulmonary:     Effort: Pulmonary effort is normal. No respiratory distress.  Neurological:     Mental Status: The patient is alert and oriented to person, place, and time. , nml strength in face, no slurred speech, no lip droop Psychiatric:        Mood and Affect: Mood normal.        Behavior: Behavior normal.   Assessment and Plan Acute cystitis without hematuria Treat with cephalexin given on anticoagulant.  Increase water. Send for culture.  Lip numbness Along with sore in mouth.. possible shingles. Treat with valacyclovir, reviewed course.  Not clear CVA/TIA and on coumadin for afib ( taking regularly)... but if other progressive neuro changes.Marland Kitchen go to ER. Eval with labs to rule out secondary cause of numbness.  Glucosuria Eval with glucose and A1C... May be causing numbness/tingling.     I discussed the assessment and treatment plan with the patient. The patient was provided an opportunity to ask questions and all were answered. The patient agreed with the plan and demonstrated an understanding of the instructions.   The patient was advised to call back or seek an in-person evaluation if the symptoms worsen or if the  condition fails to improve as anticipated.     Eliezer Lofts, MD

## 2019-03-12 NOTE — Assessment & Plan Note (Signed)
Eval with glucose and A1C... May be causing numbness/tingling.

## 2019-03-12 NOTE — Assessment & Plan Note (Signed)
Along with sore in mouth.. possible shingles. Treat with valacyclovir, reviewed course.  Not clear CVA/TIA and on coumadin for afib ( taking regularly)... but if other progressive neuro changes.Marland Kitchen go to ER. Eval with labs to rule out secondary cause of numbness.

## 2019-03-13 ENCOUNTER — Other Ambulatory Visit: Payer: Self-pay | Admitting: Cardiovascular Disease

## 2019-03-13 ENCOUNTER — Other Ambulatory Visit: Payer: Self-pay | Admitting: Family Medicine

## 2019-03-13 ENCOUNTER — Other Ambulatory Visit (INDEPENDENT_AMBULATORY_CARE_PROVIDER_SITE_OTHER): Payer: Medicare HMO

## 2019-03-13 DIAGNOSIS — R200 Anesthesia of skin: Secondary | ICD-10-CM

## 2019-03-13 LAB — T3, FREE: T3, Free: 2.3 pg/mL (ref 2.3–4.2)

## 2019-03-13 LAB — HEMOGLOBIN A1C: Hgb A1c MFr Bld: 5.6 % (ref 4.6–6.5)

## 2019-03-13 LAB — COMPREHENSIVE METABOLIC PANEL
ALT: 19 U/L (ref 0–35)
AST: 15 U/L (ref 0–37)
Albumin: 4.5 g/dL (ref 3.5–5.2)
Alkaline Phosphatase: 78 U/L (ref 39–117)
BUN: 24 mg/dL — ABNORMAL HIGH (ref 6–23)
CO2: 30 mEq/L (ref 19–32)
Calcium: 9.3 mg/dL (ref 8.4–10.5)
Chloride: 104 mEq/L (ref 96–112)
Creatinine, Ser: 0.86 mg/dL (ref 0.40–1.20)
GFR: 64.42 mL/min (ref >60.00)
Glucose, Bld: 86 mg/dL (ref 70–99)
Potassium: 5.1 mEq/L (ref 3.5–5.1)
Sodium: 139 mEq/L (ref 135–145)
Total Bilirubin: 0.7 mg/dL (ref 0.2–1.2)
Total Protein: 6.5 g/dL (ref 6.0–8.3)

## 2019-03-13 LAB — T4, FREE: Free T4: 0.86 ng/dL (ref 0.60–1.60)

## 2019-03-13 LAB — TSH: TSH: 5.32 u[IU]/mL — ABNORMAL HIGH (ref 0.35–4.50)

## 2019-03-13 LAB — VITAMIN B12: Vitamin B-12: 1463 pg/mL — ABNORMAL HIGH (ref 211–911)

## 2019-03-13 NOTE — Telephone Encounter (Signed)
Please review for refill, Thanks !  

## 2019-03-13 NOTE — Progress Notes (Signed)
Lab 7/15

## 2019-03-14 LAB — URINE CULTURE
MICRO NUMBER:: 665055
SPECIMEN QUALITY:: ADEQUATE

## 2019-03-15 ENCOUNTER — Telehealth: Payer: Self-pay

## 2019-03-15 NOTE — Telephone Encounter (Signed)
Left message to call back, has 2 results in the chart.

## 2019-03-18 NOTE — Telephone Encounter (Signed)
[facility] - Client Nonclinical Telephone Record [facility] Client [facility] - Client Client Site [place] - Night Physician Eliezer Lofts - MD Contact Type Call Who Is Calling Patient / Member / Family / Caregiver Caller Name Anner Baity Caller Phone Number [phone] Call Type Message Only Information Provided Reason for Call Returning a Call from the Office Initial [place] states she is returning a call from the office. Caller states she was in the office and had a lab check and she is not sure if this is why she was being called. Caller states she also had a urinalysis and she had a UTI. Additional Comment Office hours provided. Call Closed By: Bunnie Domino Transaction Date/Time: 03/15/2019 6:10:09 PM (ET)

## 2019-03-18 NOTE — Telephone Encounter (Signed)
Tried to call patient back and unable to reach or leave a message because mailbox was full. Will try again later.

## 2019-03-18 NOTE — Telephone Encounter (Signed)
Tried to call patient back unable to leave message because mail box is full. If patient calls back please advise of her that her mail box is full.

## 2019-03-18 NOTE — Telephone Encounter (Signed)
Patient returned call  C/B # [phone]

## 2019-03-18 NOTE — Telephone Encounter (Signed)
Patient called back and lab results were given to her.

## 2019-03-18 NOTE — Telephone Encounter (Signed)
Tried to call patient and unable to leave message because mailbox was full.  

## 2019-03-18 NOTE — Telephone Encounter (Signed)
Best number [phone] Pt returned your call

## 2019-04-15 ENCOUNTER — Other Ambulatory Visit: Payer: Self-pay

## 2019-04-15 ENCOUNTER — Ambulatory Visit (INDEPENDENT_AMBULATORY_CARE_PROVIDER_SITE_OTHER): Payer: Medicare HMO

## 2019-04-15 DIAGNOSIS — Z5181 Encounter for therapeutic drug level monitoring: Secondary | ICD-10-CM | POA: Diagnosis not present

## 2019-04-15 DIAGNOSIS — I4891 Unspecified atrial fibrillation: Secondary | ICD-10-CM

## 2019-04-15 DIAGNOSIS — Z7901 Long term (current) use of anticoagulants: Secondary | ICD-10-CM | POA: Diagnosis not present

## 2019-04-15 LAB — POCT INR: INR: 4.3 — AB (ref 2.0–3.0)

## 2019-04-15 NOTE — Patient Instructions (Signed)
Please skip warfarin tonight, then continue dosage of 1 tablet daily. Recheck in 4 weeks.

## 2019-04-28 DIAGNOSIS — R69 Illness, unspecified: Secondary | ICD-10-CM | POA: Diagnosis not present

## 2019-05-10 ENCOUNTER — Ambulatory Visit (INDEPENDENT_AMBULATORY_CARE_PROVIDER_SITE_OTHER): Payer: Medicare HMO | Admitting: Family Medicine

## 2019-05-10 ENCOUNTER — Encounter: Payer: Self-pay | Admitting: Family Medicine

## 2019-05-10 ENCOUNTER — Other Ambulatory Visit: Payer: Self-pay

## 2019-05-10 VITALS — BP 127/57 | HR 53 | Temp 97.0°F

## 2019-05-10 DIAGNOSIS — J0110 Acute frontal sinusitis, unspecified: Secondary | ICD-10-CM | POA: Diagnosis not present

## 2019-05-10 DIAGNOSIS — W64XXXA Exposure to other animate mechanical forces, initial encounter: Secondary | ICD-10-CM | POA: Diagnosis not present

## 2019-05-10 DIAGNOSIS — J019 Acute sinusitis, unspecified: Secondary | ICD-10-CM | POA: Insufficient documentation

## 2019-05-10 DIAGNOSIS — I10 Essential (primary) hypertension: Secondary | ICD-10-CM

## 2019-05-10 MED ORDER — AMOXICILLIN-POT CLAVULANATE 875-125 MG PO TABS: 1.0000 | ORAL_TABLET | Freq: Two times a day (BID) | ORAL | 0 refills | Status: DC

## 2019-05-10 NOTE — Patient Instructions (Addendum)
Drink fluids and rest  Take augmentin as directed  Update if not starting to improve in a week or if worsening   If no improvement we can test or covid-please call   Watch wound area for more redness Ewing Schlein /streaking or pain  Also fever

## 2019-05-10 NOTE — Progress Notes (Signed)
Virtual Visit via Video Note  I connected with [name] on 05/10/19 at 11:15 AM EDT by a video enabled telemedicine application and verified that I am speaking with the correct person using two identifiers.  The video portion of the visit failed today, so the rest of the visit was conducted by phone  Location: Patient: home  Provider: office    I discussed the limitations of evaluation and management by telemedicine and the availability of in person appointments. The patient expressed understanding and agreed to proceed.  Video contact failed today and visit was conducted by phone   History of Present Illness: Pt presents with sinus symptoms  Also chicken bite  Frontal sinus pain  achey  No temp 97.0  Feels lousy Puffy eyes She gets sinus infections once in a while and they get severe   Nose is stuffy  Mucous is clear  Some pnd with very mild cough  flonase helps (she has allergies)   No sick contacts    Rooster bit her back of leg  (running loose and ran after it) - 2 days ago  Scratched as well  Redness at site (worse yesterday)  Small circle of redness around both puncture areas  Ankle is a little swollen No drainage  Td 7/20  Washed with soap and water and use peroxide  Also abx ointment    Patient Active Problem List   Diagnosis Date Noted  . Acute sinusitis 05/10/2019  . Injury caused by animal 05/10/2019  . Acute cystitis without hematuria 03/12/2019  . Lip numbness 03/12/2019  . Glucosuria 03/12/2019  . Acute pain of left knee 07/19/2018  . Valvular heart disease 07/12/2018  . Essential hypertension 07/12/2018  . Tick bite of left upper arm 07/06/2018  . Acute respiratory infection 07/06/2018  . Insomnia 12/05/2017  . Primary osteoarthritis of both knees 10/12/2016  . Fracture of left pelvis ([place]) 04/29/2015  . PAF (paroxysmal atrial fibrillation) ([place]) 03/04/2015  . On amiodarone therapy 11/14/2014  . Current use of long term anticoagulation  11/14/2014  . Mild dementia ([place]) 10/24/2014  . Chronic combined systolic and diastolic CHF, NYHA class 1 ([place]) 09/29/2014  . 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 ([place]) 09/29/2014  . Hyperlipidemia LDL goal <100 04/05/2010  . GERD 04/05/2010  . 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 Hypothyroid 11/18/2008  . Generalized anxiety disorder 11/18/2008  . Major depressive disorder, recurrent episode, moderate with anxious distress ([place]) 11/18/2008  . ASTHMA 11/18/2008  . Mixed incontinence urge and stress 11/18/2008  . NEPHROLITHIASIS, HX OF 11/18/2008   Past Medical History:  Diagnosis Date  . Acne rosacea   . Anxiety   . Asthma   . Atrial fibrillation and flutter ([place]) 09/2014   Revereted from Afib RVR to 2:1 A Flutter -- TEE/ DCCV 2/9; on Amiodarone  . Chronic combined systolic and diastolic CHF, NYHA class 1 (HCC) -- Systolic dysfunction [id number][J62.83] 09/2014   Exacerbation 09/2014 2/2 Afib/flutter with RVR - 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 Mod LA dilation, normal PA pressures   . CVA (cerebral infarction)    h/o superior cerebellar infarct  . Depression   . Digoxin toxicity 09/2014  . 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 Diverticulosis   . H/O: rheumatic fever    Childhood  . HYPERLIPIDEMIA 04/05/2010   Qualifier: Diagnosis of  By: Lorelei Pont MD, Frederico Hamman    . Hypothyroid    On replacement therapy; normal TSH 09/2014  . Migraine headache   . Osteopenia   . Paroxysmal atrial fibrillation ([place]) 2005; Recurrent 09/2014   a) 2005: s/p Cox  Maze & LAA ligation; b) 09/2014: TEE-cardioversion  . Rheumatic mitral and aortic valve insufficiency 2005   a) s/p MV Ring repair; with Cox-Maze for Afib; b) Echo 2013: EF 60-65%,no Regional WMA, Gr 2 DD, no Sig MR ;; c) TEE 10/07/2014: Severlely thickened and calcified MV leaflets. Posterior MV leaflet is fixed and immobile. MAC. reduced excursion of the anterior MV leaflet. Mean MV gradient was 7m Hg and MVA calculated at cm2.    . Urinary incontinence    Past Surgical History:  Procedure Laterality Date  . ABDOMINAL HYSTERECTOMY    . CARDIAC CATHETERIZATION  Jan 2005   Pre-op R&LHC -- Nonobstructive CAD; normal PA pressures  . CARDIOVERSION  06/04/2012   Procedure: CARDIOVERSION;  Surgeon: JCarlena Bjornstad MD;  [facility];  Service: Cardiovascular;  Laterality: N/A;  . CARDIOVERSION N/A 10/07/2014   Procedure: CARDIOVERSION;  Surgeon: TSueanne Margarita MD;  [facility]  Service: Cardiovascular;  Laterality: N/A;  . COLONOSCOPY WITH PROPOFOL N/A 07/11/2017   Procedure: COLONOSCOPY WITH PROPOFOL;  Surgeon: [place], TBenay Pike MD;  Location: [facility] ENDOSCOPY;  Service: Gastroenterology;  Laterality: N/A;  . LUMBAR DISC SURGERY     x 2 distantly  . MITRAL VALVE REPAIR  2005   with Cox Maze for [employer] . [place] MYOVIEW LTD  4/7/'16   EF 37% with septal hypokinesis and diffuse hypokinesis. No ischemia or infarction. 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  .Marland KitchenRIGHT HEART CATHETERIZATION N/A 10/03/2014   Procedure: RIGHT HEART CATH;  Surgeon: DJolaine Artist MD;  [facility];  Service: Cardiovascular;  RAP 579mg, RVP 35/2/9 mmHg, PAP 45/12 mmHg, PCWP 16 mmHg; CO/I by Fick: 3.9/2.3; Ao/PA/SVC SaO2%: 97%/63%/65%.  . TEE WITHOUT CARDIOVERSION N/A 10/07/2014   Procedure: TRANSESOPHAGEAL ECHOCARDIOGRAM (TEE);  Surgeon: TrSueanne MargaritaMD;  [facility];  Service: Cardiovascular;  Laterality: N/A;  .  THYROIDECTOMY, PARTIAL     partial-no cancer; now on thyroid replacement  . TRANSTHORACIC ECHOCARDIOGRAM  2013; 2/'16 & 5/'16   a) 2013: EF 60-65%, mild MR, Gr 2 DD; b) 2/'[facility]% - severel global LV dysfunction (systolic & diastolic) - dilated LV & restrictive filling pattern - Gr 3 DD, mod reduced RV function, severely dilated LA - 6.4 cm, mod dilated RA, mod pl effusion, mod MR, mild TR; c) 5/10/'16:  EF 25-30%, mod LV dilation, no RWMA,Gr 3 DD w/ high LAP, MV sewing ring intact w/ Mod MR, Severe LA dilation  . TRANSTHORACIC ECHOCARDIOGRAM  January 2017:   EF improved to 45-50%. Mild diffuse HK. Mild MR. Moderate LA dilation. Normal PA pressures.   Social History   Tobacco Use  . Smoking status: Never Smoker  . Smokeless tobacco: Never Used  Substance Use Topics  . Alcohol use: Yes    Comment: wine occasionally  . Drug use: No   Family History  Problem Relation Age of Onset  . Diabetes Mother   . Coronary artery disease Mother   . Kidney disease Mother   . Breast cancer Neg Hx    No Known Allergies Current Outpatient Medications on File Prior to Visit  Medication Sig Dispense Refill  . acetaminophen (TYLENOL) 325 MG tablet Take 2 tablets (650 mg total) by mouth every 4 (four) hours as needed for mild pain, fever or headache. 30 tablet 0  . amiodarone (PACERONE) 200 MG tablet TAKE 1 TABLET BY MOUTH ONCE A DAY 90 tablet 3  . b complex vitamins tablet Take 1 tablet daily by mouth.    . Calcium Citrate-Vitamin D (CALCIUM + D PO) Take 1 capsule daily by mouth.     . carvedilol (COREG) 3.125 MG tablet TAKE 1 TABLET BY MOUTH 2 TIMES DAILY 180 tablet 0  . donepezil (ARICEPT) 5 MG tablet TAKE 1 TABLET BY MOUTH DAILY AT BEDTIME 90 tablet 3  . fluticasone (FLONASE) 50 MCG/ACT nasal spray Place 2 sprays into both nostrils daily. 48 g 3  . IRON, FERROUS SULFATE, PO Take 1 tablet by mouth daily.    Marland Kitchen levothyroxine (SYNTHROID) 25 MCG tablet Take 25 mcg by mouth daily before  breakfast.     . losartan (COZAAR) 25 MG tablet TAKE 1 TABLET BY MOUTH ONCE A DAY 90 tablet 1  . metroNIDAZOLE (METROCREAM) 0.75 % cream Apply topically 2 (two) times daily. 45 g 5  . Omega-3 Fatty Acids (FISH OIL PO) Take 1 capsule by mouth daily.    . permethrin (ELIMITE) 5 % cream APPLY PEA SIZED AMOUNT TO FACE ONCE A DAY  1  . traZODone (DESYREL) 50 MG tablet TAKE 1/2 TO 1 TABLET BY MOUTH AT BEDTIMEAS NEEDED FOR SLEEP 30 tablet 2  . valACYclovir (VALTREX) 1000 MG tablet Take 1 tablet (1,000 mg total) by mouth 3 (three) times daily. 21 tablet 0  . warfarin (COUMADIN) 5 MG tablet TAKE 1/2 - 1 TABLET (2.5 - 5 MG TOTAL) BY MOUTH DAILY AS DIRECTED BY COUMADIN CLINIC 90 tablet 0   No current facility-administered medications on file prior to visit.    Review of Systems  Constitutional: Negative for chills, fever and malaise/fatigue.  HENT: Positive for congestion and sinus pain. Negative for ear discharge, ear pain and sore throat.   Eyes: Negative for discharge and redness.  Respiratory: Positive for cough. Negative for shortness of breath.   Cardiovascular: Negative for chest pain and palpitations.  Gastrointestinal: Negative for abdominal pain, diarrhea, nausea and vomiting.  Skin: Negative for itching and rash.  Neurological: Positive for headaches. Negative for dizziness.    Observations/Objective: Pt sounds well  Not hoarse but nasal voice  No sob or cough during interview  Nl cognition Nl affect   Assessment and Plan: Problem List Items Addressed This Visit      Respiratory   Acute sinusitis - Primary    Cover with augmentin  Symptomatic care  Continue flonase  Watch for worse cough or fever (low threshold to test for covid) Update if not starting to improve in a week or if worsening        Relevant Medications   amoxicillin-clavulanate (AUGMENTIN) 875-125 MG tablet     Other   Injury caused by animal    Rooster bite/scratch on leg-per pt some redness and swelling   Cover with augmentin  Clean with soap and water Watch for drainage inst to call if inc red/swelling/pain  Update if not starting to improve  in a week or if worsening            Follow Up Instructions: Drink fluids and rest  Take augmentin as directed  Update if not starting to improve in a week or if worsening   If no improvement we can test or covid-please call   Watch wound area for more redness Ewing Schlein /streaking or pain  Also fever     I discussed the assessment and treatment plan with the patient. The patient was provided an opportunity to ask questions and all were answered. The patient agreed with the plan and demonstrated an understanding of the instructions.   The patient was advised to call back or seek an in-person evaluation if the symptoms worsen or if the condition fails to improve as anticipated.  I provided 16 minutes of non-face-to-face time during this encounter.   Loura Pardon, MD

## 2019-05-10 NOTE — Progress Notes (Signed)
Sent patient a text for OV via doxy meeting.  No response.  Called and LMOVM at preferred phone number.  No confidential info, asked her to call back to the clinic.  Called home number, no answer, couldn't LMOVM.

## 2019-05-11 ENCOUNTER — Encounter: Payer: Self-pay | Admitting: Family Medicine

## 2019-05-11 NOTE — Assessment & Plan Note (Signed)
Rooster bite/scratch on leg-per pt some redness and swelling  Cover with augmentin  Clean with soap and water Watch for drainage inst to call if inc red/swelling/pain  Update if not starting to improve in a week or if worsening

## 2019-05-11 NOTE — Assessment & Plan Note (Signed)
Cover with augmentin  Symptomatic care  Continue flonase  Watch for worse cough or fever (low threshold to test for covid) Update if not starting to improve in a week or if worsening

## 2019-05-12 NOTE — Assessment & Plan Note (Signed)
See above

## 2019-05-13 ENCOUNTER — Other Ambulatory Visit: Payer: Self-pay

## 2019-05-13 ENCOUNTER — Ambulatory Visit (INDEPENDENT_AMBULATORY_CARE_PROVIDER_SITE_OTHER): Payer: Medicare HMO

## 2019-05-13 DIAGNOSIS — I4891 Unspecified atrial fibrillation: Secondary | ICD-10-CM | POA: Diagnosis not present

## 2019-05-13 DIAGNOSIS — Z7901 Long term (current) use of anticoagulants: Secondary | ICD-10-CM

## 2019-05-13 DIAGNOSIS — Z5181 Encounter for therapeutic drug level monitoring: Secondary | ICD-10-CM

## 2019-05-13 LAB — POCT INR: INR: 4.2 — AB (ref 2.0–3.0)

## 2019-05-13 NOTE — Patient Instructions (Signed)
Please skip warfarin tonight, then START NEW DOSAGE of 1 tablet daily EXCEPT 1/2 tablet on [place]. Recheck in 3 weeks.

## 2019-05-27 ENCOUNTER — Other Ambulatory Visit (INDEPENDENT_AMBULATORY_CARE_PROVIDER_SITE_OTHER): Payer: Medicare HMO

## 2019-05-27 ENCOUNTER — Ambulatory Visit: Payer: Medicare HMO

## 2019-05-27 ENCOUNTER — Ambulatory Visit (INDEPENDENT_AMBULATORY_CARE_PROVIDER_SITE_OTHER): Payer: Medicare HMO

## 2019-05-27 DIAGNOSIS — Z0000 Encounter for general adult medical examination without abnormal findings: Secondary | ICD-10-CM

## 2019-05-27 DIAGNOSIS — E039 Hypothyroidism, unspecified: Secondary | ICD-10-CM | POA: Diagnosis not present

## 2019-05-27 LAB — T3, FREE: T3, Free: 2.8 pg/mL (ref 2.3–4.2)

## 2019-05-27 LAB — T4, FREE: Free T4: 1.13 ng/dL (ref 0.60–1.60)

## 2019-05-27 LAB — TSH: TSH: 8.57 u[IU]/mL — ABNORMAL HIGH (ref 0.35–4.50)

## 2019-05-27 NOTE — Patient Instructions (Signed)
[name] , Thank you for taking time to come for your Medicare Wellness Visit. I appreciate your ongoing commitment to your health goals. Please review the following plan we discussed and let me know if I can assist you in the future.   Screening recommendations/referrals: Colonoscopy: up to date, completed 07/11/2017 Mammogram: up to date, completed 11/15/2018 Bone Density: up to date, completed 08/11/2017 Recommended yearly ophthalmology/optometry visit for glaucoma screening and checkup Recommended yearly dental visit for hygiene and checkup  Vaccinations: Influenza vaccine: up to date, completed 04/28/2019 Pneumococcal vaccine: series completed Tdap vaccine: up to date, completed 02/27/2019 Shingles vaccine: series completed    Advanced directives: Please bring a copy of your POA (Power of [place]) and/or Living Will to your next appointment.   Conditions/risks identified: hypertension  Next appointment: 06/03/2019 @ 8:20 am    Preventive Care 65 Years and Older, Female Preventive care refers to lifestyle choices and visits with your health care provider that can promote health and wellness. What does preventive care include?  A yearly physical exam. This is also called an annual well check.  Dental exams once or twice a year.  Routine eye exams. Ask your health care provider how often you should have your eyes checked.  Personal lifestyle choices, including:  Daily care of your teeth and gums.  Regular physical activity.  Eating a healthy diet.  Avoiding tobacco and drug use.  Limiting alcohol use.  Practicing safe sex.  Taking low-dose aspirin every day.  Taking vitamin and mineral supplements as recommended by your health care provider. What happens during an annual well check? The services and screenings done by your health care provider during your annual well check will depend on your age, overall health, lifestyle risk factors, and family history of  disease. Counseling  Your health care provider may ask you questions about your:  Alcohol use.  Tobacco use.  Drug use.  Emotional well-being.  Home and relationship well-being.  Sexual activity.  Eating habits.  History of falls.  Memory and ability to understand (cognition).  Work and work Statistician.  Reproductive health. Screening  You may have the following tests or measurements:  Height, weight, and BMI.  Blood pressure.  Lipid and cholesterol levels. These may be checked every 5 years, or more frequently if you are over 74 years old.  Skin check.  Lung cancer screening. You may have this screening every year starting at age 62 if you have a 30-pack-year history of smoking and currently smoke or have quit within the past 15 years.  Fecal occult blood test (FOBT) of the stool. You may have this test every year starting at age 58.  Flexible sigmoidoscopy or colonoscopy. You may have a sigmoidoscopy every 5 years or a colonoscopy every 10 years starting at age 40.  Hepatitis C blood test.  Hepatitis B blood test.  Sexually transmitted disease (STD) testing.  Diabetes screening. This is done by checking your blood sugar (glucose) after you have not eaten for a while (fasting). You may have this done every 1-3 years.  Bone density scan. This is done to screen for osteoporosis. You may have this done starting at age 30.  Mammogram. This may be done every 1-2 years. Talk to your health care provider about how often you should have regular mammograms. Talk with your health care provider about your test results, treatment options, and if necessary, the need for more tests. Vaccines  Your health care provider may recommend certain vaccines, such as:  Influenza vaccine. This is recommended every year.  Tetanus, diphtheria, and acellular pertussis (Tdap, Td) vaccine. You may need a Td booster every 10 years.  Zoster vaccine. You may need this after age 49.   Pneumococcal 13-valent conjugate (PCV13) vaccine. One dose is recommended after age 37.  Pneumococcal polysaccharide (PPSV23) vaccine. One dose is recommended after age 49. Talk to your health care provider about which screenings and vaccines you need and how often you need them. This information is not intended to replace advice given to you by your health care provider. Make sure you discuss any questions you have with your health care provider. Document Released: 09/11/2015 Document Revised: 05/04/2016 Document Reviewed: 06/16/2015 Elsevier Interactive Patient Education  2017 [place] Prevention in the Home Falls can cause injuries. They can happen to people of all ages. There are many things you can do to make your home safe and to help prevent falls. What can I do on the outside of my home?  Regularly fix the edges of walkways and driveways and fix any cracks.  Remove anything that might make you trip as you walk through a door, such as a raised step or threshold.  Trim any bushes or trees on the path to your home.  Use bright outdoor lighting.  Clear any walking paths of anything that might make someone trip, such as rocks or tools.  Regularly check to see if handrails are loose or broken. Make sure that both sides of any steps have handrails.  Any raised decks and porches should have guardrails on the edges.  Have any leaves, snow, or ice cleared regularly.  Use sand or salt on walking paths during winter.  Clean up any spills in your garage right away. This includes oil or grease spills. What can I do in the bathroom?  Use night lights.  Install grab bars by the toilet and in the tub and shower. Do not use towel bars as grab bars.  Use non-skid mats or decals in the tub or shower.  If you need to sit down in the shower, use a plastic, non-slip stool.  Keep the floor dry. Clean up any water that spills on the floor as soon as it happens.  Remove soap  buildup in the tub or shower regularly.  Attach bath mats securely with double-sided non-slip rug tape.  Do not have throw rugs and other things on the floor that can make you trip. What can I do in the bedroom?  Use night lights.  Make sure that you have a light by your bed that is easy to reach.  Do not use any sheets or blankets that are too big for your bed. They should not hang down onto the floor.  Have a firm chair that has side arms. You can use this for support while you get dressed.  Do not have throw rugs and other things on the floor that can make you trip. What can I do in the kitchen?  Clean up any spills right away.  Avoid walking on wet floors.  Keep items that you use a lot in easy-to-reach places.  If you need to reach something above you, use a strong step stool that has a grab bar.  Keep electrical cords out of the way.  Do not use floor polish or wax that makes floors slippery. If you must use wax, use non-skid floor wax.  Do not have throw rugs and other things on the floor that  can make you trip. What can I do with my stairs?  Do not leave any items on the stairs.  Make sure that there are handrails on both sides of the stairs and use them. Fix handrails that are broken or loose. Make sure that handrails are as long as the stairways.  Check any carpeting to make sure that it is firmly attached to the stairs. Fix any carpet that is loose or worn.  Avoid having throw rugs at the top or bottom of the stairs. If you do have throw rugs, attach them to the floor with carpet tape.  Make sure that you have a light switch at the top of the stairs and the bottom of the stairs. If you do not have them, ask someone to add them for you. What else can I do to help prevent falls?  Wear shoes that:  Do not have high heels.  Have rubber bottoms.  Are comfortable and fit you well.  Are closed at the toe. Do not wear sandals.  If you use a stepladder:  Make  sure that it is fully opened. Do not climb a closed stepladder.  Make sure that both sides of the stepladder are locked into place.  Ask someone to hold it for you, if possible.  Clearly mark and make sure that you can see:  Any grab bars or handrails.  First and last steps.  Where the edge of each step is.  Use tools that help you move around (mobility aids) if they are needed. These include:  Canes.  Walkers.  Scooters.  Crutches.  Turn on the lights when you go into a dark area. Replace any light bulbs as soon as they burn out.  Set up your furniture so you have a clear path. Avoid moving your furniture around.  If any of your floors are uneven, fix them.  If there are any pets around you, be aware of where they are.  Review your medicines with your doctor. Some medicines can make you feel dizzy. This can increase your chance of falling. Ask your doctor what other things that you can do to help prevent falls. This information is not intended to replace advice given to you by your health care provider. Make sure you discuss any questions you have with your health care provider. Document Released: 06/11/2009 Document Revised: 01/21/2016 Document Reviewed: 09/19/2014 Elsevier Interactive Patient Education  2017 [employer].

## 2019-05-27 NOTE — Progress Notes (Signed)
PCP notes: none  Health Maintenance: none    Abnormal Screenings: none    Patient concerns: none    Nurse concerns: none    Next PCP appt.: 06/03/2019 @ 8:20 am

## 2019-05-27 NOTE — Progress Notes (Signed)
Subjective:   OMNI DUNSWORTH is a 73 y.o. female who presents for Medicare Annual (Subsequent) preventive examination.  Review of Systems:    This visit is being conducted through telemedicine via telephone at the nurse health advisor's home address due to the COVID-19 pandemic. This patient has given me verbal consent via doximity to conduct this visit, patient states they are participating from their home address. Some vital signs may be absent or patient reported.    Patient identification: identified by name, DOB, and current address  Cardiac Risk Factors include: advanced age (>52mn, >>59women);hypertension     Objective:     Vitals: There were no vitals taken for this visit.  There is no height or weight on file to calculate BMI.  Advanced Directives 05/27/2019 05/18/2018 07/11/2017 05/16/2017 03/04/2016 10/03/2014 06/04/2012  Does Patient Have a Medical Advance Directive? _0  Yes Patient does not have advance directive  Type of Advance Directive HClareLiving will HMyloLiving will Out of facility DNR (pink MOST or yellow form) HWest Ter[place] HauteLiving will HSiloLiving will [place]-  Does patient want to make changes to medical advance directive? - - - - No - Patient declined No - Patient declined -  Copy of HMilamin Chart? No - copy requested No - copy requested - No - copy requested No - copy requested No - copy requested -    Tobacco Social History   Tobacco Use  Smoking Status Never Smoker  Smokeless Tobacco Never Used     Counseling given: Not Answered   Clinical Intake:  Pre-visit preparation completed: Yes  Pain : No/denies pain     Nutritional Risks: None Diabetes: No  How often do you need to have someone help you when you read instructions, pamphlets, or other written materials from your doctor or pharmacy?: 1 - Never  What is the last grade level you completed in school?: some college  Interpreter Needed?: No  Information entered by :: CJohnson, LPN  Past Medical History:  Diagnosis Date  . Acne rosacea   . Anxiety   . Asthma   . Atrial fibrillation and flutter ([place] 09/2014   Revereted from Afib RVR to 2:1 A Flutter -- TEE/ DCCV 2/9; on Amiodarone  . 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 Mod LA dilation, normal PA pressures   . CVA (cerebral infarction)    h/o superior cerebellar infarct  . Depression   . Digoxin toxicity 09/2014  . 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 Diverticulosis   . H/O: rheumatic fever    Childhood  . HYPERLIPIDEMIA 04/05/2010   Qualifier: Diagnosis of  By: CLorelei PontMD, SFrederico Hamman   . Hypothyroid    On replacement therapy; normal TSH 09/2014  . Migraine headache   . Osteopenia   . Paroxysmal atrial fibrillation ([place] 2005;  Recurrent 09/2014   a) 2005: s/p Cox Maze & LAA ligation; b) 09/2014: TEE-cardioversion  . Rheumatic mitral and aortic valve insufficiency 2005   a) s/p MV Ring repair; with Cox-Maze for Afib; b) Echo 2013: EF 60-65%,no Regional WMA, Gr 2 DD, no Sig MR ;; c) TEE 10/07/2014: Severlely thickened and calcified MV leaflets. Posterior MV leaflet is fixed and immobile. MAC. reduced excursion of the anterior MV leaflet. Mean MV gradient was 53m Hg and MVA calculated at cm2.    . Urinary incontinence    Past Surgical  History:  Procedure Laterality Date  . ABDOMINAL HYSTERECTOMY    . CARDIAC CATHETERIZATION  Jan 2005   Pre-op R&LHC -- Nonobstructive CAD; normal PA pressures  . CARDIOVERSION  06/04/2012   Procedure: CARDIOVERSION;  Surgeon: JCarlena Bjornstad MD;  [facility];  Service: Cardiovascular;  Laterality: N/A;  . CARDIOVERSION N/A 10/07/2014   Procedure: CARDIOVERSION;  Surgeon: TSueanne Margarita MD;  [place]  Service: Cardiovascular;  Laterality: N/A;  . COLONOSCOPY WITH PROPOFOL N/A 07/11/2017   Procedure: COLONOSCOPY WITH PROPOFOL;  Surgeon: Tol[place], TBenay Pike MD;  Location: [facility] ENDOSCOPY;  Service: Gastroenterology;  Laterality: N/A;  . LUMBAR DISC SURGERY     x 2 distantly  . MITRAL VALVE REPAIR  2005   with [facility]aze for [employer] . [place] MYOVIEW LTD  4/7/'16   EF 37% with septal hypokinesis and diffuse hypokinesis. No ischemia or infarction. 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  .Marland KitchenRIGHT HEART CATHETERIZATION N/A 10/03/2014   Procedure: RIGHT HEART CATH;  Surgeon: DJolaine Artist MD;  [facility];  Service: Cardiovascular;  RAP 545mg, RVP 35/2/9 mmHg, PAP 45/12 mmHg, PCWP 16 mmHg; CO/I by Fick: 3.9/2.3; Ao/PA/SVC SaO2%: 97%/63%/65%.  . TEE WITHOUT CARDIOVERSION N/A 10/07/2014   Procedure: TRANSESOPHAGEAL ECHOCARDIOGRAM (TEE);  Surgeon: TrSueanne MargaritaMD;  [facility];  Service: Cardiovascular;  Laterality: N/A;  . THYROIDECTOMY, PARTIAL     partial-no cancer; now on thyroid replacement  . TRANSTHORACIC ECHOCARDIOGRAM  2013; 2/'16 & 5/'16   a) 2013: EF 60-65%, mild MR, Gr 2 DD; b) 2/'16 @ [facility] ~10% - severel global LV dysfunction (systolic & diastolic) - dilated LV & restrictive filling pattern - Gr 3 DD, mod reduced RV function, severely dilated LA - 6.4 cm, mod dilated RA, mod pl effusion, mod MR, mild TR; c) 5/10/'16:  EF 25-30%, mod LV dilation, no RWMA,Gr 3 DD w/ high LAP, MV sewing ring  intact w/ Mod MR, Severe LA dilation  . TRANSTHORACIC ECHOCARDIOGRAM  January 2017:   EF improved to 45-50%. Mild diffuse HK. Mild MR. Moderate LA dilation. Normal PA pressures.   Family History  Problem Relation Age of Onset  . Diabetes Mother   . Coronary artery disease Mother   . Kidney disease Mother   . Breast cancer Neg Hx    Social History   Socioeconomic History  . Marital status: Married    Spouse name: Not on file  . Number of children: 2  . Years of education: Not on file  . Highest education level: Not on file  Occupational History  . Occupation: Retired  [employer] laboratory technician. Financial resource strain: Not hard at all  . Food insecurity    Worry: Never true    Inability: Never true  . Transportation needs    Medical: No    Non-medical: No  Tobacco Use  . Smoking  status: Never Smoker  . Smokeless tobacco: Never Used  Substance and Sexual Activity  . Alcohol use: Yes    Comment: wine occasionally  . Drug use: No  . Sexual activity: Yes  Lifestyle  . Physical activity    Days per week: 0 days    Minutes per session: 0 min  . Stress: Not at all  Relationships  . Social Herbalist on phone: Not on file    Gets together: Not on file    Attends religious service: Not on file    Active member of club or organization: Not on file    Attends meetings of clubs or organizations: Not on file    Relationship status: Not on file  Other Topics Concern  . Not on file  Social History Narrative  . Not on file    Outpatient Encounter Medications as of 05/27/2019  Medication Sig  . acetaminophen (TYLENOL) 325 MG tablet Take 2 tablets (650 mg total) by mouth every 4 (four) hours as needed for mild pain, fever or headache.  Marland Kitchen amiodarone (PACERONE) 200 MG tablet TAKE 1 TABLET BY MOUTH ONCE A DAY  . b complex vitamins tablet Take 1 tablet daily by mouth.  . Calcium Citrate-Vitamin D (CALCIUM + D PO) Take 1 capsule daily by mouth.   . carvedilol (COREG) 3.125 MG  tablet TAKE 1 TABLET BY MOUTH 2 TIMES DAILY  . donepezil (ARICEPT) 5 MG tablet TAKE 1 TABLET BY MOUTH DAILY AT BEDTIME  . fluticasone (FLONASE) 50 MCG/ACT nasal spray Place 2 sprays into both nostrils daily.  . IRON, FERROUS SULFATE, PO Take 1 tablet by mouth daily.  Marland Kitchen levothyroxine (SYNTHROID) 25 MCG tablet Take 25 mcg by mouth daily before breakfast.   . losartan (COZAAR) 25 MG tablet TAKE 1 TABLET BY MOUTH ONCE A DAY  . metroNIDAZOLE (METROCREAM) 0.75 % cream Apply topically 2 (two) times daily.  . Omega-3 Fatty Acids (FISH OIL PO) Take 1 capsule by mouth daily.  . permethrin (ELIMITE) 5 % cream APPLY PEA SIZED AMOUNT TO FACE ONCE A DAY  . traZODone (DESYREL) 50 MG tablet TAKE 1/2 TO 1 TABLET BY MOUTH AT BEDTIMEAS NEEDED FOR SLEEP  . valACYclovir (VALTREX) 1000 MG tablet Take 1 tablet (1,000 mg total) by mouth 3 (three) times daily.  Marland Kitchen warfarin (COUMADIN) 5 MG tablet TAKE 1/2 - 1 TABLET (2.5 - 5 MG TOTAL) BY MOUTH DAILY AS DIRECTED BY COUMADIN CLINIC  . amoxicillin-clavulanate (AUGMENTIN) 875-125 MG tablet Take 1 tablet by mouth 2 (two) times daily. With food (Patient not taking: Reported on 05/27/2019)   No facility-administered encounter medications on file as of 05/27/2019.     Activities of Daily Living In your present state of health, do you have any difficulty performing the following activities: 05/27/2019  Hearing? Y  Comment wears hearing aids  Vision? N  Difficulty concentrating or making decisions? N  Walking or climbing stairs? Y  Comment Patient has knee pain  Dressing or bathing? N  Doing errands, shopping? N  Preparing Food and eating ? N  Using the Toilet? N  In the past six months, have you accidently leaked urine? Y  Comment wears pads daily  Do you have problems with loss of bowel control? N  Managing your Medications? N  Managing your Finances? N  Housekeeping or managing your Housekeeping? N  Some recent data might be hidden    Patient Care Team: Owens Loffler, MD as PCP - General (Family  Medicine)    Assessment:   This is a routine wellness examination for [place].  Exercise Activities and Dietary recommendations Current Exercise Habits: The patient does not participate in regular exercise at present, Exercise limited by: orthopedic condition(s)  Goals    . Increase water intake     Starting 05/18/2018, I will continue to drink at least 6-8 glasses of water daily.     . Patient Stated     05/27/2019, Patient wants to improve her strength and be able to walk better.       Fall Risk Fall Risk  05/27/2019 05/18/2018 05/16/2017 03/04/2016  Falls in the past year? 0 No No Yes  Number falls in past yr: 0 - - 1  Injury with Fall? - - - Yes  Risk for fall due to : Medication side effect - - -  Follow up Falls evaluation completed;Falls prevention discussed - - Falls evaluation completed   Is the patient's home free of loose throw rugs in walkways, pet beds, electrical cords, etc?   yes      Grab bars in the bathroom? yes      Handrails on the stairs?   yes      Adequate lighting?   yes  Timed Get Up and Go performed: n/a  Depression Screen PHQ 2/9 Scores 05/27/2019 05/18/2018 05/16/2017 03/04/2016  PHQ - 2 Score 0 0 0 0  PHQ- 9 Score 0 0 2 -     Cognitive Function MMSE - Mini Mental State Exam 05/27/2019 05/20/2018 05/18/2018 05/16/2017 03/04/2016  Orientation to time 5 - _0 Orientation to Place 5 - _1 Registration 3 - _2 Attention/ Calculation 5 - 0 0 0  Recall _3 Recall-comments - - - - pt was unable to recall 1 of 3 words  Language- name 2 objects - - 0 0 0  Language- repeat 1 - _4 Language- follow 3 step command - - _5 Language- read & follow direction - - 0 0 0  Write a sentence - - 0 0 0  Copy design - - 0 0 0  Total score - - _6 Mini Cog  Mini-Cog screen was completed. Maximum score is 22. A value of 0 denotes this part of the MMSE was not completed or the patient failed this part of the  Mini-Cog screening.      Immunization History  Administered Date(s) Administered  . Influenza Split 05/11/2011  . Influenza Whole 06/30/2010  . Influenza, High Dose Seasonal PF 06/27/2015, 05/01/2018, 04/28/2019  . Influenza,inj,Quad PF,6+ Mos 07/29/2013, 05/27/2016, 05/16/2017  . Pneumococcal Conjugate-13 08/26/2015  . Pneumococcal Polysaccharide-23 04/05/2010  . Td 02/27/2019  . Zoster Recombinat (Shingrix) 03/29/2018, 08/25/2018    Qualifies for Shingles Vaccine? series completed   Screening Tests Health Maintenance  Topic Date Due  . MAMMOGRAM  07/13/2020  . COLONOSCOPY  07/12/2027  . TETANUS/TDAP  02/26/2029  . INFLUENZA VACCINE  Completed  . DEXA SCAN  Completed  . Hepatitis C Screening  Completed  . PNA vac Low Risk Adult  Completed    Cancer Screenings: Lung: Low Dose CT Chest recommended if Age 102-80 years, 30 pack-year currently smoking OR have quit w/in 15years. Patient does not qualify. Breast:  Up to date on Mammogram? Yes, completed 11/15/2018  Up to date of Bone Density/Dexa? Yes, completed 08/11/2017 Colorectal: completed 07/11/2017  Additional Screenings: Hepatitis C Screening:  03/04/2016     Plan:    Patient wants to improve her strength and walk better.    I have personally reviewed and noted the following in the patient's chart:   . Medical and social history . Use of alcohol, tobacco or illicit drugs  . Current medications and supplements . Functional ability and status . Nutritional status . Physical activity . Advanced directives . List of other physicians . Hospitalizations, surgeries, and ER visits in previous 12 months . Vitals . Screenings to include cognitive, depression, and falls . Referrals and appointments  In addition, I have reviewed and discussed with patient certain preventive protocols, quality metrics, and best practice recommendations. A written personalized care plan for preventive services as well as general preventive  health recommendations were provided to patient.     Andrez Grime, LPN  [id number]

## 2019-05-28 ENCOUNTER — Other Ambulatory Visit: Payer: Medicare HMO

## 2019-05-30 ENCOUNTER — Other Ambulatory Visit: Payer: Self-pay | Admitting: Family Medicine

## 2019-05-30 ENCOUNTER — Other Ambulatory Visit: Payer: Self-pay | Admitting: Cardiovascular Disease

## 2019-05-30 NOTE — Telephone Encounter (Signed)
Left message for [name] to return call.  I need verification on her levothyroxine dose and instructions that she is currently taking.

## 2019-05-30 NOTE — Telephone Encounter (Signed)
Please did not refill this and I will speak to her about this at her office visit.

## 2019-05-30 NOTE — Telephone Encounter (Signed)
Medication list states patient is taking levothyroxine 25 mcg daily.  Pharmacy is requesting for 2 tablets daily.  The last documentation I can find states patient was decreased down to 2 tablets daily on 05/28/2018.  Thyroid labs recently drawn on 05/27/2019 and [name] is scheduled for her CPE on Monday 06/03/2019.  Please advise.

## 2019-05-30 NOTE — Telephone Encounter (Signed)
Noted  

## 2019-05-30 NOTE — Telephone Encounter (Signed)
Refill request

## 2019-06-02 NOTE — Progress Notes (Signed)
Deborah Brann T. Nessie Nong, MD Primary Care and [place] at [facility] [place], [address] Phone: [phone]  FAX: [phone]  [name] - 73 y.o. female  MRN [id number]  Date of Birth: Sep 01, 1945  Visit Date: 06/03/2019  PCP: Owens Loffler, MD  Referred by: Owens Loffler, MD  Chief Complaint  Patient presents with  . Annual Exam    Part 2    Patient Care Team: Owens Loffler, MD as PCP - General (Family Medicine) Subjective:   [name] is a 73 y.o. pleasant patient who presents with the following:  Health Maintenance Summary Reviewed and updated, unless pt declines services.  Tobacco History Reviewed. Non-smoker Alcohol: No concerns, no excessive use Exercise Habits: Some activity, rec at least 30 mins 5 times a week STD concerns: none Drug Use: None Birth control method: n/a, s/p hyst Menses regular: n/a Lumps or breast concerns: no Breast Cancer Family History: no  She has multiple medical problems.This includes a dilated cardiomyopathy.  She does have some mild dementia as well as a history of rheumatic valve disease.  In the past she has had difficulty with significant depression.  She is on chronic Coumadin use.  She also has history of hypothyroidism.  Thyroid: No symptoms. Labs reviewed. Denies cold / heat intolerance, dry skin, hair loss. No goiter.  Lab Results  Component Value Date   TSH 8.57 (H) 05/27/2019   From May until Now, pain animals.   Both of her knees hurt.   Inject both knees for OA and pain.  Tape worm test.   b knee oa - b injection  Health Maintenance  Topic Date Due  . MAMMOGRAM  07/13/2020  . COLONOSCOPY  07/12/2027  . TETANUS/TDAP  02/26/2029  . INFLUENZA VACCINE  Completed  . DEXA SCAN  Completed  . Hepatitis C Screening  Completed  . PNA vac Low Risk Adult  Completed    Immunization History  Administered Date(s) Administered  . Influenza Split  05/11/2011  . Influenza Whole 06/30/2010  . Influenza, High Dose Seasonal PF 06/27/2015, 05/01/2018, 04/28/2019  . Influenza,inj,Quad PF,6+ Mos 07/29/2013, 05/27/2016, 05/16/2017  . Pneumococcal Conjugate-13 08/26/2015  . Pneumococcal Polysaccharide-23 04/05/2010  . Td 02/27/2019  . Zoster Recombinat (Shingrix) 03/29/2018, 08/25/2018   Patient Active Problem List   Diagnosis Date Noted  . Mild dementia ([place]) 10/24/2014    Priority: High  . Chronic combined systolic and diastolic CHF, NYHA class 1 ([place]) 09/29/2014    Priority: High  . 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 Generalized anxiety disorder 11/18/2008    Priority: Medium  . Major depressive disorder, recurrent episode, moderate with anxious distress ([place]) 11/18/2008    Priority: Medium  . Valvular heart disease 07/12/2018  . Essential hypertension 07/12/2018  . Insomnia 12/05/2017  . Primary osteoarthritis of both knees 10/12/2016  . Fracture of left pelvis ([place]) 04/29/2015  . PAF (paroxysmal atrial fibrillation) ([place]) 03/04/2015  . On amiodarone therapy 11/14/2014  . Current use of long term anticoagulation 11/14/2014  . Hyperlipidemia LDL goal <100 04/05/2010  . GERD 04/05/2010  . Hypothyroid 11/18/2008  . ASTHMA 11/18/2008  . Mixed incontinence urge and stress 11/18/2008  . NEPHROLITHIASIS, HX OF 11/18/2008   Past Medical History:  Diagnosis Date  . Acne rosacea   . Anxiety   .  Asthma   . Atrial fibrillation and flutter ([place]) 09/2014   Revereted from Afib RVR to 2:1 A Flutter -- TEE/ DCCV 2/9; on Amiodarone  . Chronic combined systolic and diastolic CHF, NYHA class 1 ([facility]) -- Systolic dysfunction QMVHQION[G29.52] 09/2014   Exacerbation 09/2014 2/2 Afib/flutter with RVR - likely Tachycardia induced Cardiomyopathy; Echo 12/2014: EF 25-30%, no RWMA ;; ECHO 08/2015: EF  45-50% with mild HK. Mod LA dilation, normal PA pressures   . CVA (cerebral infarction)    h/o superior cerebellar infarct  . Depression   . Digoxin toxicity 09/2014  . 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 Diverticulosis   . H/O: rheumatic fever    Childhood  . HYPERLIPIDEMIA 04/05/2010   Qualifier: Diagnosis of  By: Lorelei Pont MD, Frederico Hamman    . Hypothyroid    On replacement therapy; normal TSH 09/2014  . Migraine headache   . Osteopenia   . Paroxysmal atrial fibrillation ([place]) 2005; Recurrent 09/2014   a) 2005: s/p Cox Maze & LAA ligation; b) 09/2014: TEE-cardioversion  . Rheumatic mitral and aortic valve insufficiency 2005   a) s/p MV Ring repair; with Cox-Maze for Afib; b) Echo 2013: EF 60-65%,no Regional WMA, Gr 2 DD, no Sig MR ;; c) TEE 10/07/2014: Severlely thickened and calcified MV leaflets. Posterior MV leaflet is fixed and immobile. MAC. reduced excursion of the anterior MV leaflet. Mean MV gradient was 45m Hg and MVA calculated at cm2.    . Urinary incontinence    Past Surgical History:  Procedure Laterality Date  . ABDOMINAL HYSTERECTOMY    . CARDIAC CATHETERIZATION  Jan 2005   Pre-op R&LHC -- Nonobstructive CAD; normal PA pressures  . CARDIOVERSION  06/04/2012   Procedure: CARDIOVERSION;  Surgeon: JCarlena Bjornstad MD;  [facility];  Service: Cardiovascular;  Laterality: N/A;  . CARDIOVERSION N/A 10/07/2014   Procedure: CARDIOVERSION;  Surgeon: TSueanne Margarita MD;  [place]  Service: Cardiovascular;  Laterality: N/A;  . COLONOSCOPY WITH PROPOFOL N/A 07/11/2017   Procedure: COLONOSCOPY WITH PROPOFOL;  Surgeon: Tol[place], TBenay Pike MD;  Location:  [facility] ENDOSCOPY;  Service: Gastroenterology;  Laterality: N/A;  . LUMBAR DISC SURGERY     x 2 distantly  . MITRAL VALVE REPAIR  2005   with [facility]aze for [employer] . [place] MYOVIEW LTD  4/7/'16   EF 37% with septal hypokinesis and diffuse hypokinesis. No ischemia or infarction. 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  .Marland KitchenRIGHT HEART CATHETERIZATION N/A 10/03/2014   Procedure: RIGHT HEART CATH;  Surgeon: DJolaine Artist MD;  [facility];  Service: Cardiovascular;  RAP 560mg, RVP 35/2/9 mmHg, PAP 45/12 mmHg, PCWP 16 mmHg; CO/I by Fick: 3.9/2.3; Ao/PA/SVC SaO2%: 97%/63%/65%.  . TEE WITHOUT CARDIOVERSION N/A 10/07/2014   Procedure: TRANSESOPHAGEAL ECHOCARDIOGRAM (TEE);  Surgeon: TrSueanne MargaritaMD;  [facility];  Service: Cardiovascular;  Laterality: N/A;  . THYROIDECTOMY, PARTIAL     partial-no cancer; now on thyroid replacement  . TRANSTHORACIC ECHOCARDIOGRAM  2013; 2/'16 & 5/'16   a) 2013: EF 60-65%, mild MR, Gr 2 DD; b) 2/'16 @  [facility]: EF ~10% - severel global LV dysfunction (systolic & diastolic) - dilated LV & restrictive filling pattern - Gr 3 DD, mod reduced RV function, severely dilated LA - 6.4 cm, mod dilated RA, mod pl effusion, mod MR, mild TR; c) 5/10/'16:  EF 25-30%, mod LV dilation, no RWMA,Gr 3 DD w/ high LAP, MV sewing ring intact w/ Mod MR, Severe LA dilation  . TRANSTHORACIC ECHOCARDIOGRAM  January 2017:   EF improved to 45-50%. Mild diffuse HK. Mild MR. Moderate LA dilation. Normal PA pressures.   Social History   Socioeconomic History  . Marital status: Married    Spouse name: Not on file  . Number of children: 2  . Years of education: Not on file  . Highest education level: Not on file  Occupational History  . Occupation: Retired  Scientific laboratory technician  . Financial resource strain: Not hard at all  . Food insecurity    Worry: Never true    Inability: Never true  . Transportation needs    Medical: No     Non-medical: No  Tobacco Use  . Smoking status: Never Smoker  . Smokeless tobacco: Never Used  Substance and Sexual Activity  . Alcohol use: Yes    Comment: wine occasionally  . Drug use: No  . Sexual activity: Yes  Lifestyle  . Physical activity    Days per week: 0 days    Minutes per session: 0 min  . Stress: Not at all  Relationships  . Social Herbalist on phone: Not on file    Gets together: Not on file    Attends religious service: Not on file    Active member of club or organization: Not on file    Attends meetings of clubs or organizations: Not on file    Relationship status: Not on file  . Intimate partner violence    Fear of current or ex partner: No    Emotionally abused: No    Physically abused: No    Forced sexual activity: No  Other Topics Concern  . Not on file  Social History Narrative  . Not on file   Family History  Problem Relation Age of Onset  . Diabetes Mother   . Coronary artery disease Mother   . Kidney disease Mother   . Breast cancer Neg Hx    No Known Allergies  Medication list has been reviewed and updated.   General: Denies fever, chills, sweats. No significant weight loss. Eyes: Denies blurring,significant itching ENT: Denies earache, sore throat, and hoarseness.  Cardiovascular: Denies chest pains, palpitations, dyspnea on exertion,  Respiratory: Denies cough, dyspnea at rest,wheeezing Breast: no concerns about lumps GI: Denies nausea, vomiting, diarrhea, constipation, change in bowel habits, abdominal pain, melena, hematochezia GU: Denies dysuria, hematuria, urinary hesitancy, nocturia, denies STD risk, no concerns about discharge Musculoskeletal: B knee pain Derm: Denies rash, itching Neuro: Denies  paresthesias, frequent falls, frequent headaches Psych: Denies depression, anxiety Endocrine: Denies cold intolerance, heat intolerance, polydipsia Heme: Denies enlarged lymph nodes Allergy: No hayfever  Objective:    BP 120/60   Pulse (!) 52   Temp (!) 97.3 F (36.3 C) (Temporal)   Ht 5' 7.5" (1.715 m)   Wt 155 lb (70.3 kg)   SpO2 98%   BMI 23.92 kg/m  Ideal Body Weight: Weight in (lb) to have BMI = 25: 161.7 No exam data present Depression screen [facility] 2/9 05/27/2019 05/18/2018 05/16/2017 03/04/2016  Decreased Interest 0 0 0 0  Down, Depressed, Hopeless 0 0 0 0  PHQ - 2 Score 0 0 0 0  Altered sleeping 0 0 1 -  Tired, decreased energy 0 0 1 -  Change in appetite 0 0 0 -  Feeling bad or failure about yourself  0 0 0 -  Trouble concentrating 0 0 0 -  Moving slowly or fidgety/restless 0 0 0 -  Suicidal thoughts 0 0 0 -  PHQ-9 Score 0 0 2 -  Difficult doing work/chores Not difficult at all Not difficult at all Not difficult at all -  Some recent data might be hidden     GEN: well developed, well nourished, no acute distress Eyes: conjunctiva and lids normal, PERRLA, EOMI ENT: TM clear, nares clear, oral exam WNL Neck: supple, no lymphadenopathy, no thyromegaly, no JVD Pulm: clear to auscultation and percussion, respiratory effort normal CV: regular rate and rhythm, S1-S2, no murmur, rub or gallop, no bruits Chest: no scars, masses, no lumps BREAST: breast exam declined GI: soft, non-tender; no hepatosplenomegaly, masses; active bowel sounds all quadrants GU: GU exam declined Lymph: no cervical, axillary or inguinal adenopathy MSK: B knees with minor effusion, pain at medial and lateral joint lines  SKIN: clear, good turgor, color WNL, no rashes, lesions, or ulcerations Neuro: normal mental status, normal strength, sensation, and motion Psych: alert; oriented to person, place and time, normally interactive and not anxious or depressed in appearance.   All labs reviewed with patient. Results for orders placed or performed in visit on 05/27/19  T4, free  Result Value Ref Range   Free T4 1.13 0.60 - 1.60 ng/dL  T3, free  Result Value Ref Range   T3, Free 2.8 2.3 - 4.2 pg/mL  TSH  Result  Value Ref Range   TSH 8.57 (H) 0.35 - 4.50 uIU/mL   No results found.  Assessment and Plan:     ICD-10-CM   1. Healthcare maintenance  Z00.00   2. Place of occurrence, [place]  Y92.79 CANCELED: Pinworm prep  3. Primary osteoarthritis of knees, bilateral  M17.0 methylPREDNISolone acetate (DEPO-MEDROL) injection 40 mg    methylPREDNISolone acetate (DEPO-MEDROL) injection 40 mg  4. Hx of tapeworm infection  Z86.19 Pinworm prep    Ova and parasite examination   Concern for tapeworm infection - farm exposure  Injection of both knees - anticoagulated.  O/w doing OK  Health Maintenance Exam: The patient's preventative maintenance and recommended screening tests for an annual wellness exam were reviewed in full today. Brought up to date unless services declined.  Counselled on the importance of diet, exercise, and its role in overall health and mortality. The patient's FH and SH was reviewed, including their home life, tobacco status, and drug and alcohol status.  Follow-up in 1 year for physical exam or additional follow-up below.  Aspiration/Injection Procedure Note [name] July 09, 1946 Date of procedure: 06/03/2019  Procedure: Large Joint Joint Aspiration / Injection of the Right Knee Indications: Pain  Procedure Details Patient verbally consented to procedure. Risks (including potential rare risk of infection), benefits, and alternatives explained. Sterilely prepped with Chloraprep. Ethyl cholride used for anesthesia. 8 cc Lidocaine 1% mixed with 2 mL Depo-Medrol 40 mg injected using the anteromedial approach without difficulty. No complications with procedure and tolerated well. Patient had decreased pain post-injection.  Medication: 2 mL of Depo-Medrol 40 mg, equaling Depo-Medrol 80 mg total  Aspiration/Injection Procedure Note [name] December 20, 1945 Date of procedure: 06/03/2019  Procedure: Large Joint Aspiration / Injection of the  Left Knee Indications: Pain   Procedure Details Patient verbally consented to procedure. Risks (including potential rare risk of infection), benefits, and alternatives explained. Sterilely prepped with Chloraprep. Ethyl cholride used for anesthesia. 8 cc Lidocaine 1% mixed with 2 mL Depo-Medrol 40 mg injected using the anteromedial approach without difficulty. No complications with procedure and tolerated well. Patient had decreased pain post-injection.  Medication: 2 mL of Depo-Medrol 40 mg, equaling Depo-Medrol 80 mg total   Follow-up: No follow-ups on file. Or follow-up in 1 year if not noted.  Meds ordered this encounter  Medications  . methylPREDNISolone acetate (DEPO-MEDROL) injection 40 mg  . methylPREDNISolone acetate (DEPO-MEDROL) injection 40 mg   Medications Discontinued During This Encounter  Medication Reason  . acetaminophen (TYLENOL) 325 MG tablet Completed Course  . amoxicillin-clavulanate (AUGMENTIN) 875-125 MG tablet Completed Course  . valACYclovir (VALTREX) 1000 MG tablet Completed Course   Orders Placed This Encounter  Procedures  . Pinworm prep  . Ova and parasite examination    Signed,  Habiba Treloar T. Kenzel Ruesch, MD   Allergies as of 06/03/2019   No Known Allergies     Medication List       Accurate as of June 03, 2019 11:27 AM. If you have any questions, ask your nurse or doctor.        STOP taking these medications   acetaminophen 325 MG tablet Commonly known as: TYLENOL Stopped by: Owens Loffler, MD   amoxicillin-clavulanate 875-125 MG tablet Commonly known as: AUGMENTIN Stopped by: Owens Loffler, MD   valACYclovir 1000 MG tablet Commonly known as: VALTREX Stopped by: Owens Loffler, MD     TAKE these medications   amiodarone 200 MG tablet Commonly known as: PACERONE TAKE 1 TABLET BY MOUTH ONCE A DAY   b complex vitamins tablet Take 1 tablet daily by mouth.   CALCIUM + D PO Take 1 capsule daily by mouth.   carvedilol 3.125 MG tablet Commonly known as:  COREG TAKE 1 TABLET BY MOUTH 2 TIMES DAILY   donepezil 5 MG tablet Commonly known as: ARICEPT TAKE 1 TABLET BY MOUTH DAILY AT BEDTIME   FISH OIL PO Take 1 capsule by mouth daily.   fluticasone 50 MCG/ACT nasal spray Commonly known as: FLONASE Place 2 sprays into both nostrils daily.   IRON (FERROUS SULFATE) PO Take 1 tablet by mouth daily.   levothyroxine 25 MCG tablet Commonly known as: SYNTHROID Take 50 mcg by mouth daily before breakfast.   losartan 25 MG tablet Commonly known as: COZAAR TAKE 1 TABLET BY MOUTH ONCE DAILY   metroNIDAZOLE 0.75 % cream Commonly known as: METROCREAM Apply topically 2 (two) times daily.   permethrin 5 % cream Commonly known as: ELIMITE APPLY PEA SIZED AMOUNT TO FACE ONCE A DAY   traZODone 50 MG tablet Commonly known as: DESYREL TAKE 1/2 TO 1 TABLET BY MOUTH AT BEDTIMEAS NEEDED FOR SLEEP   warfarin 5 MG tablet Commonly known as: COUMADIN Take as directed by the anticoagulation clinic. If you are unsure how to take this medication, talk to your nurse or doctor. Original instructions: TAKE 1/2 TO 1 TABLET (2.5-5 MG TOTAL) BYMOUTH DAILY AS DIRECTED BY COUMADIN CLINIC

## 2019-06-03 ENCOUNTER — Other Ambulatory Visit: Payer: Self-pay

## 2019-06-03 ENCOUNTER — Ambulatory Visit: Payer: Medicare HMO | Admitting: Family Medicine

## 2019-06-03 ENCOUNTER — Encounter: Payer: Self-pay | Admitting: Family Medicine

## 2019-06-03 VITALS — BP 120/60 | HR 52 | Temp 97.3°F | Ht 67.5 in | Wt 155.0 lb

## 2019-06-03 DIAGNOSIS — Z0001 Encounter for general adult medical examination with abnormal findings: Secondary | ICD-10-CM

## 2019-06-03 DIAGNOSIS — Y9279 Other farm location as the place of occurrence of the external cause: Secondary | ICD-10-CM | POA: Diagnosis not present

## 2019-06-03 DIAGNOSIS — Z8619 Personal history of other infectious and parasitic diseases: Secondary | ICD-10-CM

## 2019-06-03 DIAGNOSIS — Z0000 Encounter for general adult medical examination without abnormal findings: Secondary | ICD-10-CM

## 2019-06-03 DIAGNOSIS — M170 Bilateral primary osteoarthritis of knee: Secondary | ICD-10-CM

## 2019-06-03 MED ORDER — METHYLPREDNISOLONE ACETATE 40 MG/ML IJ SUSP
40.0000 mg | Freq: Once | INTRAMUSCULAR | Status: AC
  Administered 2019-06-03: 40 mg via INTRAMUSCULAR

## 2019-06-03 MED ORDER — METHYLPREDNISOLONE ACETATE 40 MG/ML IJ SUSP
40.0000 mg | Freq: Once | INTRAMUSCULAR | Status: AC
  Administered 2019-06-03: 09:00:00 40 mg via INTRAMUSCULAR

## 2019-06-10 ENCOUNTER — Other Ambulatory Visit: Payer: Medicare HMO

## 2019-06-10 ENCOUNTER — Other Ambulatory Visit: Payer: Self-pay

## 2019-06-10 DIAGNOSIS — Z8619 Personal history of other infectious and parasitic diseases: Secondary | ICD-10-CM

## 2019-06-11 ENCOUNTER — Other Ambulatory Visit: Payer: Self-pay | Admitting: Family Medicine

## 2019-06-11 ENCOUNTER — Other Ambulatory Visit: Payer: Self-pay | Admitting: Internal Medicine

## 2019-06-11 LAB — OVA AND PARASITE EXAMINATION
CONCENTRATE RESULT:: NONE SEEN
MICRO NUMBER:: 979285
SPECIMEN QUALITY:: ADEQUATE
TRICHROME RESULT:: NONE SEEN

## 2019-06-11 LAB — PINWORM PREP
MICRO NUMBER:: 979286
RESULT:: NONE SEEN
SPECIMEN QUALITY:: ADEQUATE

## 2019-06-11 NOTE — Telephone Encounter (Signed)
I changed to 50 microgram tabs instead, # 1 daily

## 2019-06-11 NOTE — Telephone Encounter (Signed)
Did you discuss this with Ms. Maute at her CPE.  Is she to take 2 tablets daily.  Please advise.

## 2019-07-03 ENCOUNTER — Other Ambulatory Visit: Payer: Self-pay

## 2019-07-03 ENCOUNTER — Ambulatory Visit (INDEPENDENT_AMBULATORY_CARE_PROVIDER_SITE_OTHER): Payer: Medicare HMO

## 2019-07-03 DIAGNOSIS — Z7901 Long term (current) use of anticoagulants: Secondary | ICD-10-CM | POA: Diagnosis not present

## 2019-07-03 DIAGNOSIS — Z5181 Encounter for therapeutic drug level monitoring: Secondary | ICD-10-CM

## 2019-07-03 DIAGNOSIS — I4891 Unspecified atrial fibrillation: Secondary | ICD-10-CM

## 2019-07-03 LAB — POCT INR: INR: 2.6 (ref 2.0–3.0)

## 2019-07-03 NOTE — Patient Instructions (Signed)
Please continue dosage of 1 tablet daily EXCEPT 1/2 tablet on [place]. Recheck in 4 weeks.

## 2019-07-03 NOTE — Progress Notes (Signed)
Pt's last INR check 05/13/19, missed appt on 06/05/19.  Pt was adamant w/ front desk and myself that she had been at appt on 10/7 and that her INR reading was 4.7.  Advised pt that I did not see her on that day, that I had her down as a No Show and I do not have INR reading for that day.  Pt apologized for confusion.

## 2019-07-18 ENCOUNTER — Other Ambulatory Visit: Payer: Self-pay | Admitting: Family Medicine

## 2019-07-26 ENCOUNTER — Other Ambulatory Visit: Payer: Self-pay | Admitting: Family Medicine

## 2019-07-31 ENCOUNTER — Other Ambulatory Visit: Payer: Self-pay

## 2019-07-31 ENCOUNTER — Ambulatory Visit (INDEPENDENT_AMBULATORY_CARE_PROVIDER_SITE_OTHER): Payer: Medicare HMO

## 2019-07-31 DIAGNOSIS — Z7901 Long term (current) use of anticoagulants: Secondary | ICD-10-CM | POA: Diagnosis not present

## 2019-07-31 DIAGNOSIS — Z5181 Encounter for therapeutic drug level monitoring: Secondary | ICD-10-CM

## 2019-07-31 DIAGNOSIS — I4891 Unspecified atrial fibrillation: Secondary | ICD-10-CM

## 2019-07-31 LAB — POCT INR: INR: 2.4 (ref 2.0–3.0)

## 2019-07-31 NOTE — Patient Instructions (Signed)
Please continue dosage of 1 tablet daily EXCEPT 1/2 tablet on [place]. Recheck in 5 weeks.

## 2019-08-01 ENCOUNTER — Telehealth: Payer: Self-pay

## 2019-08-01 NOTE — Telephone Encounter (Signed)
Gerald Stabs with [facility] notified as instructed by telephone.  Left message for Maury that she should only be taking 25 mg of Losartan a day.  I advised her that Dr. Lorelei Pont increased her thyroid medication to 50 mcg daily but her Losartan was not changed.  I ask that she call me back if she has any questions.

## 2019-08-01 NOTE — Telephone Encounter (Signed)
[name] with [facility] left v/m; pt was thinking Losartan was increased to 50 mg taking one daily and Gerald Stabs requesting new rx for losartan 50 mg. Per med list and annual on 06/03/19 do not see where losartan was increased from 25 mg to 50 mg.Please advise.

## 2019-08-01 NOTE — Telephone Encounter (Signed)
Please call  Incorrect. I did increase her synthroid to 50 mcg tablets  Losartan is still 25 mg a day

## 2019-08-02 NOTE — Telephone Encounter (Signed)
Patient returned call Advised of message below.  She stated she understood and had no further questions

## 2019-08-12 ENCOUNTER — Ambulatory Visit (INDEPENDENT_AMBULATORY_CARE_PROVIDER_SITE_OTHER): Payer: Medicare HMO | Admitting: Family

## 2019-08-12 ENCOUNTER — Encounter: Payer: Self-pay | Admitting: Family

## 2019-08-12 ENCOUNTER — Other Ambulatory Visit: Payer: Self-pay

## 2019-08-12 VITALS — BP 136/70 | HR 52 | Ht 67.5 in | Wt 159.0 lb

## 2019-08-12 DIAGNOSIS — I10 Essential (primary) hypertension: Secondary | ICD-10-CM | POA: Diagnosis not present

## 2019-08-12 DIAGNOSIS — I428 Other cardiomyopathies: Secondary | ICD-10-CM | POA: Diagnosis not present

## 2019-08-12 DIAGNOSIS — Z79899 Other long term (current) drug therapy: Secondary | ICD-10-CM | POA: Diagnosis not present

## 2019-08-12 DIAGNOSIS — I480 Paroxysmal atrial fibrillation: Secondary | ICD-10-CM | POA: Diagnosis not present

## 2019-08-12 DIAGNOSIS — I43 Cardiomyopathy in diseases classified elsewhere: Secondary | ICD-10-CM

## 2019-08-12 DIAGNOSIS — R000 Tachycardia, unspecified: Secondary | ICD-10-CM | POA: Diagnosis not present

## 2019-08-12 MED ORDER — AMIODARONE HCL 200 MG PO TABS: 200.0000 mg | ORAL_TABLET | Freq: Every day | ORAL | 2 refills | Status: DC

## 2019-08-12 NOTE — Progress Notes (Signed)
Office Visit    Patient Name: [name] Date of Encounter: 08/12/2019  Primary Care Provider:  Owens Loffler, MD Primary Cardiologist:  Nelva Bush, MD Electrophysiologist:  None   Chief Complaint    [name] is a 73 y.o. 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    Past Medical History    Past Medical History:  Diagnosis Date  . Acne rosacea   . Anxiety   . Asthma   . Atrial fibrillation and flutter ([place]) 09/2014   Revereted from Afib RVR to 2:1 A Flutter -- TEE/ DCCV 2/9; on Amiodarone  . Chronic combined systolic and diastolic CHF, NYHA class 1 (HCC) -- Systolic dysfunction [id number][Q20.60] 09/2014   Exacerbation 09/2014 2/2 Afib/flutter with RVR - 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 Mod LA dilation, normal PA pressures   . CVA (cerebral infarction)    h/o superior cerebellar infarct  . Depression   . Digoxin toxicity 09/2014  . 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 Diverticulosis   . H/O: rheumatic fever    Childhood  . HYPERLIPIDEMIA 04/05/2010   Qualifier: Diagnosis of  By: Lorelei Pont MD, Frederico Hamman    . Hypothyroid    On replacement therapy; normal TSH 09/2014  . Migraine headache   . Osteopenia   . Paroxysmal atrial fibrillation ([place]) 2005; Recurrent 09/2014   a) 2005: s/p Cox Maze & LAA  ligation; b) 09/2014: TEE-cardioversion  . Rheumatic mitral and aortic valve insufficiency 2005   a) s/p MV Ring repair; with Cox-Maze for Afib; b) Echo 2013: EF 60-65%,no Regional WMA, Gr 2 DD, no Sig MR ;; c) TEE 10/07/2014: Severlely thickened and calcified MV leaflets. Posterior MV leaflet is fixed and immobile. MAC. reduced excursion of the anterior MV leaflet. Mean MV gradient was 54m Hg and MVA calculated at cm2.    . Urinary incontinence    Past Surgical History:  Procedure Laterality Date  . ABDOMINAL HYSTERECTOMY    . CARDIAC CATHETERIZATION  Jan 2005   Pre-op R&LHC -- Nonobstructive CAD; normal PA pressures  . CARDIOVERSION  06/04/2012   Procedure: CARDIOVERSION;  Surgeon: JCarlena Bjornstad MD;  [facility];  Service: Cardiovascular;  Laterality: N/A;  . CARDIOVERSION N/A 10/07/2014   Procedure: CARDIOVERSION;  Surgeon: TSueanne Margarita MD;  [facility] [facility]  Service: Cardiovascular;  Laterality: N/A;  . COLONOSCOPY WITH PROPOFOL N/A 07/11/2017   Procedure: COLONOSCOPY WITH PROPOFOL;  Surgeon: [place], TBenay Pike MD;  Location: [facility] ENDOSCOPY;  Service: Gastroenterology;  Laterality: N/A;  . LUMBAR DISC SURGERY     x 2 distantly  . MITRAL VALVE REPAIR  2005   with [facility] Maze for [employer] . [place] [employer]  4/7/'16   EF 37% with septal hypokinesis and diffuse hypokinesis. No ischemia or infarction. Read as "intermediate risk "secondary to decreased EF consistent  with nonischemic cardiac myopathy.  Marland Kitchen RIGHT HEART CATHETERIZATION N/A 10/03/2014   Procedure: RIGHT HEART CATH;  Surgeon: Jolaine Artist, MD;  Location: [facility] CATH LAB;  Service: Cardiovascular;  RAP 96mHg, RVP 35/2/9 mmHg, PAP 45/12 mmHg, PCWP 16 mmHg; CO/I by Fick: 3.9/2.3; Ao/PA/SVC SaO2%: 97%/63%/65%.  . TEE WITHOUT CARDIOVERSION N/A 10/07/2014   Procedure: TRANSESOPHAGEAL ECHOCARDIOGRAM (TEE);  Surgeon: TSueanne Margarita MD;  [facility];  Service: Cardiovascular;  Laterality: N/A;  . THYROIDECTOMY,  PARTIAL     partial-no cancer; now on thyroid replacement  . TRANSTHORACIC ECHOCARDIOGRAM  2013; 2/'16 & 5/'16   a) 2013: EF 60-65%, mild MR, Gr 2 DD; b) [facility]/'[facility] ~10% - severel global LV dysfunction (systolic & diastolic) - dilated LV & restrictive filling pattern - Gr 3 DD, mod reduced RV function, severely dilated LA - 6.4 cm, mod dilated RA, mod pl effusion, mod MR, mild TR; c) 5/10/'16:  EF 25-30%, mod LV dilation, no RWMA,Gr 3 DD w/ high LAP, MV sewing ring intact w/ Mod MR, Severe LA dilation  . TRANSTHORACIC ECHOCARDIOGRAM  January 2017:   EF improved to 45-50%. Mild diffuse HK. Mild MR. Moderate LA dilation. Normal PA pressures.    Allergies  No Known Allergies  History of Present Illness    [name] a 73y.o. 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 ESaunders Revel11/14/19. She presents today for annual follow up of her NICM and valvular disease.  Her LVEF was as low as 15%. 08/2015 echo EF improved to 45-50%. 06/2017 echo LVEF improved to 50-55%. She had a CXR 06/2018 due to long term use of amiodarone with "No edema or consolidation. Status post median sternotomy. Heart upper normal in size. No evident adenopathy". PFT 02/20/19 with "No obvious evidence of obstructive or restrictive disease".   Reports doing well. She stays very busy caring for animals and her garden at her home. Reports no SOB nor DOE with activity. She reports no chest pain, pressure, tightness. She does not check her BP at home, but reports it is routinely good when seen by providers. She believes it to be elevated today as she had to walk a good distance from her car into the office.   She reports some trouble with arthritis in her knees that she has been following closely with her PCP.     She denies dizziness, lightheadedness, near-syncope, syncope.   EKGs/Labs/Other Studies Reviewed:   The following studies were reviewed today: Cath/PCI:  Right heart catheterization (10/03/14): RA 5, RV 35/4, PA 45/12 (22), PCWP 16, SvO2 66%, Fick CO/CI 3.9/2.3  L/RHC (09/16/03): No significant CAD.  LVEF 50% with severe mitral valve prolapse and MR.  RA 3, RV 28/4, PA 21/10 (16), PCWP 12.  Thermodiluation CO 2.5.   CV Surgery:  Mitral valve repair with placement of 351mSeguin annuloplasty ring and modified Cox-Maze procedure (10/15/03, Dr. OwRoxy Manns   EP Procedures and Devices:  DCCV (03/12/04)  DCCV (06/04/12)  DCCV (10/07/14)   Non-Invasive Evaluation(s):  TTE (07/26/17): Mildly dilated LV.  LVEF 50-55% with normal wall motion.  Evidence of mitral valve repair with mild to moderate regurgitation.  Moderate left atrial enlargement.  Normal RV size and function.  Mild pulmonary hypertension.  Myocardial perfusion stress test (12/04/14): No ischemia identified.  LVEF 37% with  global hypokinesis, most pronounced in the septum.  Intermediate risk study (due to low LVEF).  TTE (01/06/15): Moderately dilated LV with normal wall thickness.  LVEF 25-30% with grade 3 diastolic dysfunction.  Moderate MR status post mitral valve repair.  LA severely dilated.  Mild TR.  Normal PA pressure.  Normal RV size and function.  TTE (09/09/15): Mildly dilated LV with LVEF 45-50%.  Trivial AI and mild MR.  Moderately dilated LA.  Normal RV size/function and PA pressure.  EKG:  EKG is ordered today.  The ekg ordered today demonstrates SB rate 52 bpm with sinus arrhythmia and 1st degree AV block which is stable compared to previous.  Recent Labs: 11/07/2018: Hemoglobin 13.5; Platelets 180.0; Pro B Natriuretic peptide (BNP) 117.0 03/13/2019: ALT 19; BUN 24; Creatinine, Ser 0.86; Potassium 5.1; Sodium 139 05/27/2019: TSH 8.57  Recent Lipid Panel    Component Value Date/Time   CHOL 189 05/18/2018 0841   CHOL  123 09/30/2014 1650   TRIG 103.0 05/18/2018 0841   TRIG 93 09/30/2014 1650   HDL 65.90 05/18/2018 0841   HDL 53 09/30/2014 1650   CHOLHDL 3 05/18/2018 0841   VLDL 20.6 05/18/2018 0841   VLDL 19 09/30/2014 1650   LDLCALC 102 (H) 05/18/2018 0841   LDLCALC 51 09/30/2014 1650   LDLDIRECT 132.3 03/31/2010 0906    Home Medications   Current Meds  Medication Sig  . amiodarone (PACERONE) 200 MG tablet Take 1 tablet (200 mg total) by mouth daily.  Marland Kitchen b complex vitamins tablet Take 1 tablet daily by mouth.  . Calcium Citrate-Vitamin D (CALCIUM + D PO) Take 1 capsule daily by mouth.   . carvedilol (COREG) 3.125 MG tablet TAKE 1 TABLET BY MOUTH 2 TIMES DAILY  . donepezil (ARICEPT) 5 MG tablet TAKE 1 TABLET BY MOUTH ONCE DAILY AT BEDTIME  . fluticasone (FLONASE) 50 MCG/ACT nasal spray Place 2 sprays into both nostrils daily.  . IRON, FERROUS SULFATE, PO Take 1 tablet by mouth daily.  Marland Kitchen levothyroxine (SYNTHROID) 50 MCG tablet Take 1 tablet (50 mcg total) by mouth daily.  Marland Kitchen losartan (COZAAR) 25 MG tablet TAKE 1 TABLET BY MOUTH ONCE DAILY  . metroNIDAZOLE (METROCREAM) 0.75 % cream Apply topically 2 (two) times daily.  . Omega-3 Fatty Acids (FISH OIL PO) Take 1 capsule by mouth daily.  . permethrin (ELIMITE) 5 % cream APPLY PEA SIZED AMOUNT TO FACE ONCE A DAY  . traZODone (DESYREL) 50 MG tablet TAKE 1/2 TO 1 TABLET BY MOUTH AT BEDTIMEAS NEEDED FOR SLEEP  . warfarin (COUMADIN) 5 MG tablet TAKE 1/2 TO 1 TABLET (2.5-5 MG TOTAL) BYMOUTH DAILY AS DIRECTED BY COUMADIN CLINIC  . [DISCONTINUED] amiodarone (PACERONE) 200 MG tablet Take 1 tablet (200 mg total) by mouth daily. *NEEDS OFFICE VISIT FOR FURTHER REFILLS*    Review of Systems  Review of Systems  Constitution: Negative for chills, fever and malaise/fatigue.  Cardiovascular: Negative for chest pain, dyspnea on exertion, irregular heartbeat, leg swelling, near-syncope and palpitations.  Musculoskeletal: Positive for knee pain. Respiratory: Negative  for cough, shortness of breath and wheezing.   Gastrointestinal: Negative for nausea and vomiting.  Neurological: Negative for dizziness, light-headedness and weakness.   All other systems reviewed and are otherwise negative except as noted above.  Physical Exam    VS:  BP 136/70 (BP Location: Left Arm, Patient Position: Sitting, Cuff Size: Normal)   Pulse (!) 52   Ht 5' 7.5" (1.715 m)   Wt 159 lb (72.1 kg)  SpO2 93%   BMI 24.54 kg/m  , BMI Body mass index is 24.54 kg/m. GEN: Well nourished, well developed, in no acute distress. HEENT: normal. Neck: Supple, no JVD, carotid bruits, or masses. Cardiac: Bradycardic and regular, no rubs, or gallops.1/6 systolic murmur.  No clubbing, cyanosis, edema.  Radials/DP/PT 2+ and equal bilaterally.  Respiratory:  Respirations regular and unlabored, clear to auscultation bilaterally. GI: Soft, nontender, nondistended, BS + x 4. MS: No deformity or atrophy. Skin: Warm and dry, no rash. Neuro:  Strength and sensation are intact. Psych: Normal affect.  Accessory Clinical Findings    ECG personally reviewed by me today - SB with sinus arrhythmia and 1st degree AV block - stable when compared to previous - no acute changes.  Assessment & Plan    1. NICM - Euvolemic on exam. NYHA I. Echo 06/2017 EF 50-55% with mid-mod MR and mild pulmonary HTN. Continue low sodium diet. Continue regular cardiovascular exercise. Continue GDMT of beta blocker and ARB. No indication for diuretic at this time.  2. Severe MR s/p MV repair 2005 - Echo 06/2017 EF 50-55% with mid-moderate mitral regurgitation and mild pulmonary hypertension. No chest pain, SOB, DOE. Stable 1/6 murmur. Continue SBE prophylaxis. Continue optimized BP and volume status.   3. HTN - BP mildly elevated today after walking a distance into office. Reports BP normally well controlled. Continue present anti-hypertensive regimen. She will report BP consistently >130/80.  4. Bradycardia -  Asymptomatic with no lightheadedness, dizziness, near-syncope. Continue present medications. She will report any new symptoms.   5. Paroxysmal atrial fibrillation/flutter - Denies palpitations, flutter. No evidence of recurrence. Continue beta blocker and amiodarone. Continue Warfarin for anticoagulation.  6. Chronic anticoagulation - Secondary to paroxysmal atrial fib/flutter. Denies bleeding complications. Follows closely with Coumadin clinic.   7. On amiodarone therapy - CXR 06/2018 with no acute findings. 02/20/19 normal PFTs. Continue present Amidoarone dose.   8. COPD - Follows with PCP. PFT 02/20/19 with "no obvious evidence of obstructive or restrictive disease". Denies SOB, DOE.   9. Hypothyroidism - Continue to follow with PCP.   Disposition: Follow up in 1 year(s) with Dr. Saunders Revel or APP.   Loel Dubonnet, NP 08/12/2019, 11:41 AM

## 2019-08-12 NOTE — Patient Instructions (Signed)
Medication Instructions:  Your physician recommends that you continue on your current medications as directed. Please refer to the Current Medication list given to you today.  *If you need a refill on your cardiac medications before your next appointment, please call your pharmacy*  Lab Work: none If you have labs (blood work) drawn today and your tests are completely normal, you will receive your results only by: Marland Kitchen MyChart Message (if you have MyChart) OR . A paper copy in the mail If you have any lab test that is abnormal or we need to change your treatment, we will call you to review the results.  Testing/Procedures: none  Follow-Up: At [facility], you and your health needs are our priority.  As part of our continuing mission to provide you with exceptional heart care, we have created designated Provider Care Teams.  These Care Teams include your primary Cardiologist (physician) and Advanced Practice Providers (APPs -  Physician Assistants and Nurse Practitioners) who all work together to provide you with the care you need, when you need it.  Your next appointment:   12 month(s)  The format for your next appointment:   In Person  Provider:    You may see Nelva Bush, MD or one of the following Advanced Practice Providers on your designated Care Team:    Murray Hodgkins, NP  Christell Faith, PA-C  Marrianne Mood, PA-C

## 2019-08-20 ENCOUNTER — Other Ambulatory Visit: Payer: Self-pay | Admitting: Family Medicine

## 2019-08-20 DIAGNOSIS — Z1231 Encounter for screening mammogram for malignant neoplasm of breast: Secondary | ICD-10-CM

## 2019-08-31 ENCOUNTER — Other Ambulatory Visit: Payer: Self-pay | Admitting: Internal Medicine

## 2019-08-31 ENCOUNTER — Other Ambulatory Visit: Payer: Self-pay | Admitting: Family Medicine

## 2019-09-02 NOTE — Telephone Encounter (Signed)
Refill Request.  

## 2019-09-04 ENCOUNTER — Other Ambulatory Visit: Payer: Self-pay

## 2019-09-04 ENCOUNTER — Ambulatory Visit (INDEPENDENT_AMBULATORY_CARE_PROVIDER_SITE_OTHER): Payer: Medicare HMO

## 2019-09-04 DIAGNOSIS — Z5181 Encounter for therapeutic drug level monitoring: Secondary | ICD-10-CM | POA: Diagnosis not present

## 2019-09-04 DIAGNOSIS — Z7901 Long term (current) use of anticoagulants: Secondary | ICD-10-CM | POA: Diagnosis not present

## 2019-09-04 DIAGNOSIS — I4891 Unspecified atrial fibrillation: Secondary | ICD-10-CM

## 2019-09-04 LAB — POCT INR: INR: 1.4 — AB (ref 2.0–3.0)

## 2019-09-04 NOTE — Patient Instructions (Signed)
Please take 2 tablets today, then continue dosage of 1 tablet daily EXCEPT 1/2 tablet on [place]. Recheck in 5 weeks.

## 2019-09-11 ENCOUNTER — Other Ambulatory Visit: Payer: Self-pay | Admitting: Family Medicine

## 2019-09-26 ENCOUNTER — Ambulatory Visit
Admission: RE | Admit: 2019-09-26 | Discharge: 2019-09-26 | Disposition: A | Discharge status: Home / Self Care | Payer: Medicare HMO | Source: Ambulatory Visit | Attending: Family Medicine | Admitting: Family Medicine

## 2019-09-26 DIAGNOSIS — Z1231 Encounter for screening mammogram for malignant neoplasm of breast: Secondary | ICD-10-CM

## 2019-10-09 ENCOUNTER — Other Ambulatory Visit: Payer: Self-pay

## 2019-10-09 ENCOUNTER — Ambulatory Visit (INDEPENDENT_AMBULATORY_CARE_PROVIDER_SITE_OTHER): Payer: Medicare HMO

## 2019-10-09 DIAGNOSIS — Z5181 Encounter for therapeutic drug level monitoring: Secondary | ICD-10-CM | POA: Diagnosis not present

## 2019-10-09 DIAGNOSIS — I4891 Unspecified atrial fibrillation: Secondary | ICD-10-CM

## 2019-10-09 DIAGNOSIS — Z7901 Long term (current) use of anticoagulants: Secondary | ICD-10-CM

## 2019-10-09 LAB — POCT INR: INR: 2 (ref 2.0–3.0)

## 2019-10-09 NOTE — Patient Instructions (Signed)
-   continue dosage of warfarin 1 tablet daily EXCEPT 1/2 tablet on MONDAYS & FRIDAYS. - recheck in 6 weeks.  

## 2019-10-14 ENCOUNTER — Other Ambulatory Visit: Payer: Self-pay

## 2019-10-14 ENCOUNTER — Ambulatory Visit: Payer: Medicare HMO | Admitting: Family Medicine

## 2019-10-14 ENCOUNTER — Encounter: Payer: Self-pay | Admitting: Family Medicine

## 2019-10-14 VITALS — BP 110/66 | HR 56 | Temp 96.6°F | Ht 67.5 in | Wt 161.5 lb

## 2019-10-14 DIAGNOSIS — M170 Bilateral primary osteoarthritis of knee: Secondary | ICD-10-CM | POA: Diagnosis not present

## 2019-10-14 DIAGNOSIS — M549 Dorsalgia, unspecified: Secondary | ICD-10-CM | POA: Diagnosis not present

## 2019-10-14 DIAGNOSIS — G8929 Other chronic pain: Secondary | ICD-10-CM

## 2019-10-14 MED ORDER — METHYLPREDNISOLONE ACETATE 40 MG/ML IJ SUSP
80.0000 mg | Freq: Once | INTRAMUSCULAR | Status: AC
  Administered 2019-10-14: 12:00:00 80 mg via INTRA_ARTICULAR

## 2019-10-14 NOTE — Progress Notes (Signed)
Deborah Cheatum T. [name], MD Primary Care and [place] at [facility] [place], [address] Phone: [phone]  FAX: [phone]  BRYNNLI [name] - 74 y.o. female  MRN [id number]  Date of Birth: 1946-03-08  Visit Date: 10/14/2019  PCP: Owens Loffler, MD  Referred by: Owens Loffler, MD  Chief Complaint  Patient presents with  . Knee Pain    Bilateral Knee injections  . Back Pain    This visit occurred during the SARS-CoV-2 public health emergency.  Safety protocols were in place, including screening questions prior to the visit, additional usage of staff PPE, and extensive cleaning of exam room while observing appropriate contact time as indicated for disinfecting solutions.   Subjective:   [name] is a 74 y.o. very pleasant female patient with Body mass index is 24.92 kg/m. who presents with the following:  B knee pain and back pain: She has known osteoarthritis, tricompartmental, in both knees.  She also has some chondrocalcinosis.  In the past she has had significant relief of symptoms with some intra-articular injection of corticosteroid.  Back is bothering for a good while.  Hurting more and more.  More inactive with Covid and not as much in the yard.   This is been an ongoing issue.  It is worsened since the COVID-19 outbreak, and she has been much less active than she typically would be.  She is not having any numbness or tingling, and no radicular symptoms.  b knee inj  Review of Systems is noted in the HPI, as appropriate   Objective:   BP 110/66   Pulse (!) 56   Temp (!) 96.6 F (35.9 C) (Temporal)   Ht 5' 7.5" (1.715 m)   Wt 161 lb 8 oz (73.3 kg)   SpO2 98%   BMI 24.92 kg/m    GEN: No acute distress; alert,appropriate. PULM: Breathing comfortably in no respiratory distress PSYCH: Normally interactive.   Range of motion at  the waist: Flexion: normal Extension: normal Lateral bending:  normal Rotation: all normal  No echymosis or edema Rises to examination table with no difficulty Gait: non antalgic  Inspection/Deformity: N Paraspinus Tenderness: Bilateral L2-S1, mild  B Ankle Dorsiflexion (L5,4): 5/5 B Great Toe Dorsiflexion (L5,4): 5/5 Heel Walk (L5): WNL Toe Walk (S1): WNL Rise/Squat (L4): WNL  SENSORY B Medial Foot (L4): WNL B Dorsum (L5): WNL B Lateral (S1): WNL Light Touch: WNL Pinprick: WNL  REFLEXES Knee (L4): 2+ Ankle (S1): 2+  B SLR, seated: neg B SLR, supine: neg B Greater Troch: NT B Log Roll: neg B Sciatic Notch: NT   Bilateral knees with significant crepitus and pain at the medial lateral joint lines  Radiology:  Assessment and Plan:     ICD-10-CM   1. Primary osteoarthritis of knees, bilateral  M17.0 methylPREDNISolone acetate (DEPO-MEDROL) injection 80 mg    methylPREDNISolone acetate (DEPO-MEDROL) injection 80 mg  2. Chronic back pain greater than 3 months duration  M54.9    G89.29    Ongoing tricompartmental arthritis.  Corticosteroid injection today.  If these ultimately do not provide significant relief then viscosupplementation would be a reasonable idea.  Ongoing back pain and would anticipate degenerative disc disease and spondyloarthropathy.  I did pull up the patient's 2018 lumbar spine views, and independently reviewed these myself.  Patient does have some degenerative disc disease and L4-S1, but otherwise there is grossly no significant abnormality.  No fractures or dislocations.Electronically Signed  By: Owens Loffler, MD On: 10/14/2019 11:20 AM EST   Discussed being more active.  Aspiration/Injection Procedure Note JAZZMAN KIETH December 08, 1945 Date of procedure: 10/14/2019  Procedure: Large Joint Joint Aspiration / Injection of the Right Knee Indications: Pain  Procedure Details Patient verbally consented to procedure. Risks (including potential rare risk of infection), benefits, and alternatives explained.  Sterilely prepped with Chloraprep. Ethyl cholride used for anesthesia. 8 cc Lidocaine 1% mixed with 2 mL Depo-Medrol 40 mg injected using the anteromedial approach without difficulty. No complications with procedure and tolerated well. Patient had decreased pain post-injection.  Medication: 2 mL of Depo-Medrol 40 mg, equaling Depo-Medrol 80 mg total  Aspiration/Injection Procedure Note JAMETRA WALLINGTON 1946/06/09 Date of procedure: 10/14/2019  Procedure: Large Joint Aspiration / Injection of the Left Knee Indications: Pain  Procedure Details Patient verbally consented to procedure. Risks (including potential rare risk of infection), benefits, and alternatives explained. Sterilely prepped with Chloraprep. Ethyl cholride used for anesthesia. 8 cc Lidocaine 1% mixed with 2 mL Depo-Medrol 40 mg injected using the anteromedial approach without difficulty. No complications with procedure and tolerated well. Patient had decreased pain post-injection.  Medication: 2 mL of Depo-Medrol 40 mg, equaling Depo-Medrol 80 mg total   Follow-up: No follow-ups on file.  Meds ordered this encounter  Medications  . methylPREDNISolone acetate (DEPO-MEDROL) injection 80 mg  . methylPREDNISolone acetate (DEPO-MEDROL) injection 80 mg   There are no discontinued medications. No orders of the defined types were placed in this encounter.   Signed,  Maud Deed. Kekoa Fyock, MD   Outpatient Encounter Medications as of 10/14/2019  Medication Sig  . amiodarone (PACERONE) 200 MG tablet Take 1 tablet (200 mg total) by mouth daily.  Marland Kitchen b complex vitamins tablet Take 1 tablet daily by mouth.  . Calcium Citrate-Vitamin D (CALCIUM + D PO) Take 1 capsule daily by mouth.   . carvedilol (COREG) 3.125 MG tablet TAKE 1 TABLET BY MOUTH TWICE A DAY  . donepezil (ARICEPT) 5 MG tablet TAKE 1 TABLET BY MOUTH ONCE DAILY AT BEDTIME  . fluticasone (FLONASE) 50 MCG/ACT nasal spray Place 2 sprays into both nostrils daily.  . IRON, FERROUS  SULFATE, PO Take 1 tablet by mouth daily.  Marland Kitchen levothyroxine (SYNTHROID) 50 MCG tablet Take 1 tablet (50 mcg total) by mouth daily.  Marland Kitchen losartan (COZAAR) 25 MG tablet TAKE 1 TABLET BY MOUTH ONCE DAILY  . metroNIDAZOLE (METROCREAM) 0.75 % cream Apply topically 2 (two) times daily.  . Omega-3 Fatty Acids (FISH OIL PO) Take 1 capsule by mouth daily.  . permethrin (ELIMITE) 5 % cream APPLY PEA SIZED AMOUNT TO FACE ONCE A DAY  . traZODone (DESYREL) 50 MG tablet TAKE 1/2 TO 1 TABLET BY MOUTH AT BEDTIMEAS NEEDED FOR SLEEP  . warfarin (COUMADIN) 5 MG tablet TAKE 1/2 TO 1 TABLET BY MOUTH DAILY AS DIRECTED BY COUMADIN CLINIC  . [EXPIRED] methylPREDNISolone acetate (DEPO-MEDROL) injection 80 mg   . [EXPIRED] methylPREDNISolone acetate (DEPO-MEDROL) injection 80 mg    No facility-administered encounter medications on file as of 10/14/2019.

## 2019-11-20 ENCOUNTER — Other Ambulatory Visit: Payer: Self-pay

## 2019-11-20 ENCOUNTER — Ambulatory Visit (INDEPENDENT_AMBULATORY_CARE_PROVIDER_SITE_OTHER): Payer: Medicare HMO

## 2019-11-20 DIAGNOSIS — Z5181 Encounter for therapeutic drug level monitoring: Secondary | ICD-10-CM

## 2019-11-20 DIAGNOSIS — I4891 Unspecified atrial fibrillation: Secondary | ICD-10-CM

## 2019-11-20 DIAGNOSIS — Z7901 Long term (current) use of anticoagulants: Secondary | ICD-10-CM

## 2019-11-20 LAB — POCT INR: INR: 2.5 (ref 2.0–3.0)

## 2019-11-20 NOTE — Patient Instructions (Signed)
-   continue dosage of warfarin 1 tablet daily EXCEPT 1/2 tablet on MONDAYS & FRIDAYS. - recheck in 6 weeks.  

## 2019-11-28 ENCOUNTER — Other Ambulatory Visit: Payer: Self-pay

## 2019-11-28 MED ORDER — TRAZODONE HCL 50 MG PO TABS: ORAL_TABLET | ORAL | 2 refills | Status: DC

## 2019-12-07 ENCOUNTER — Other Ambulatory Visit: Payer: Self-pay | Admitting: Family Medicine

## 2020-01-01 ENCOUNTER — Ambulatory Visit (INDEPENDENT_AMBULATORY_CARE_PROVIDER_SITE_OTHER): Payer: Medicare HMO

## 2020-01-01 ENCOUNTER — Other Ambulatory Visit: Payer: Self-pay

## 2020-01-01 DIAGNOSIS — Z5181 Encounter for therapeutic drug level monitoring: Secondary | ICD-10-CM

## 2020-01-01 DIAGNOSIS — Z7901 Long term (current) use of anticoagulants: Secondary | ICD-10-CM | POA: Diagnosis not present

## 2020-01-01 DIAGNOSIS — I4891 Unspecified atrial fibrillation: Secondary | ICD-10-CM

## 2020-01-01 LAB — POCT INR: INR: 2.5 (ref 2.0–3.0)

## 2020-01-01 NOTE — Patient Instructions (Signed)
-   continue dosage of warfarin 1 tablet daily EXCEPT 1/2 tablet on MONDAYS & FRIDAYS. - recheck in 6 weeks.  

## 2020-01-12 NOTE — Progress Notes (Signed)
Deborah Gerhold T. [name], MD, [place] at [facility] [place], [address]  Phone: [phone]  FAX: [phone]  [name] - 74 y.o. female  MRN [id number]  Date of Birth: [birth year]  Date: 01/13/2020  PCP: Owens Loffler, MD  Referral: Owens Loffler, MD  Chief Complaint  Patient presents with  . Knee Pain    Bilateral    This visit occurred during the SARS-CoV-2 public health emergency.  Safety protocols were in place, including screening questions prior to the visit, additional usage of staff PPE, and extensive cleaning of exam room while observing appropriate contact time as indicated for disinfecting solutions.   Subjective:   [name] is a 74 y.o. very pleasant female patient with Body mass index is 25.46 kg/m. who presents with the following:  She is a pleasant lady with multiple medical problems including rheumatic heart disease and A. fib on chronic anticoagulation and known primary osteoarthritis of both knees.  She is here in follow-up regarding her knee arthritis.  She has had responded at least reasonably okay with some corticosteroid injections in the past.  Many oral medications or not really appropriate due to her amiodarone use as well as her chronic Coumadin use.  The radiological images were independently reviewed by myself in the office and results were reviewed with the patient. My independent interpretation of images: I reviewed her left and right knee weightbearing knee films from 2018, and they do show some tricompartmental degenerative changes, mild in character with more of a moderate joint space narrowing and osteophytosis in the patellofemoral joint.  She also has chondrocalcinosis and demineralization.  There is no fracture or dislocation.Electronically Signed  By: Owens Loffler, MD On: 01/13/2020  9:40 AM EDT  Colchicine would be ideal with  question of CPPD, but she is on amiodarone as well as Coumadin.  Review of Systems is noted in the HPI, as appropriate   Objective:   BP 110/66   Pulse (!) 54   Temp 98.2 F (36.8 C) (Temporal)   Ht 5' 7.5" (1.715 m)   Wt 165 lb (74.8 kg)   SpO2 97%   BMI 25.46 kg/m .  GEN: No acute distress; alert,appropriate. PULM: Breathing comfortably in no respiratory distress PSYCH: Normally interactive.    She continues to have some joint line pain in the medial lateral joint lines bilaterally.  He also has a mild effusion bilaterally.  He does have full extension and flexion to 110 degrees bilaterally.  All ligamentous structures are intact there is well is some nontender tendons.  Radiology: No results found.  Assessment and Plan:     ICD-10-CM   1. Primary osteoarthritis of both knees  M17.0 methylPREDNISolone acetate (DEPO-MEDROL) injection 80 mg    methylPREDNISolone acetate (DEPO-MEDROL) injection 80 mg  2. 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 PAF (paroxysmal atrial fibrillation) (HCC)  I48.0   4. Current use of long term anticoagulation  Z79.01   5. 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  Colchicine does not interact with amiodarone in this case.  Think is reasonable to do intra-articular injection of corticosteroid, he has had good responses in the past.  Symptomatic relief, upcoming trip for 2 weddings.  Other options are limited.  Aspiration/Injection Procedure Note [name] [birth year] Date of procedure: 01/13/2020  Procedure: Large Joint Joint Aspiration / Injection of the Right Knee Indications: Pain  Procedure Details Patient verbally consented to procedure. Risks (including potential rare risk of infection), benefits, and alternatives explained. Sterilely prepped with Chloraprep. Ethyl cholride used for anesthesia. 8 cc Lidocaine 1% mixed with 2 mL  Depo-Medrol 40 mg injected using the anteromedial approach without difficulty. No complications with procedure and tolerated well. Patient had decreased pain post-injection.  Medication: 2 mL of Depo-Medrol 40 mg, equaling Depo-Medrol 80 mg total  Aspiration/Injection Procedure Note [name] Apr 23, 1946 Date of procedure: 01/13/2020  Procedure: Large Joint Aspiration / Injection of the Left Knee Indications: Pain  Procedure Details Patient verbally consented to procedure. Risks (including potential rare risk of infection), benefits, and alternatives explained. Sterilely prepped with Chloraprep. Ethyl cholride used for anesthesia. 8 cc Lidocaine 1% mixed with 2 mL Depo-Medrol 40 mg injected using the anteromedial approach without difficulty. No complications with procedure and tolerated well. Patient had decreased pain post-injection.  Medication: 2 mL of Depo-Medrol 40 mg, equaling Depo-Medrol 80 mg total  Follow-up: No follow-ups on file.  Meds ordered this encounter  Medications  . methylPREDNISolone acetate (DEPO-MEDROL) injection 80 mg  . methylPREDNISolone acetate (DEPO-MEDROL) injection 80 mg   There are no discontinued medications. No orders of the defined types were placed in this encounter.   Signed,  Deborah Deed. Makayla Lanter, MD   Outpatient Encounter Medications as of 01/13/2020  Medication Sig  . amiodarone (PACERONE) 200 MG tablet Take 1 tablet (200 mg total) by mouth daily.  Marland Kitchen b complex vitamins tablet Take 1 tablet daily by mouth.  . Calcium Citrate-Vitamin D (CALCIUM + D PO) Take 1 capsule daily by mouth.   . carvedilol (COREG) 3.125 MG tablet TAKE 1 TABLET BY MOUTH TWICE A DAY  . donepezil (ARICEPT) 5 MG tablet TAKE 1 TABLET BY MOUTH ONCE DAILY AT BEDTIME  . fluticasone (FLONASE) 50 MCG/ACT nasal spray Place 2 sprays into both nostrils daily.  . IRON, FERROUS SULFATE, PO Take 1 tablet by mouth daily.  Marland Kitchen levothyroxine (SYNTHROID) 50 MCG tablet Take 1 tablet (50 mcg  total) by mouth daily.  Marland Kitchen losartan (COZAAR) 25 MG tablet TAKE 1 TABLET BY MOUTH ONCE A DAY  . metroNIDAZOLE (METROCREAM) 0.75 % cream Apply topically 2 (two) times daily.  . Omega-3 Fatty Acids (FISH OIL PO) Take 1 capsule by mouth daily.  . permethrin (ELIMITE) 5 % cream APPLY PEA SIZED AMOUNT TO FACE ONCE A DAY  . traZODone (DESYREL) 50 MG tablet TAKE 1/2 TO 1 TABLET BY MOUTH AT BEDTIMEAS NEEDED FOR SLEEP  . warfarin (COUMADIN) 5 MG tablet TAKE 1/2 TO 1 TABLET BY MOUTH DAILY AS DIRECTED BY COUMADIN CLINIC   Facility-Administered Encounter Medications as of 01/13/2020  Medication  . methylPREDNISolone acetate (DEPO-MEDROL) injection 80 mg  . [COMPLETED] methylPREDNISolone acetate (DEPO-MEDROL) injection 80 mg

## 2020-01-13 ENCOUNTER — Encounter: Payer: Self-pay | Admitting: Family Medicine

## 2020-01-13 ENCOUNTER — Ambulatory Visit: Payer: Medicare HMO | Admitting: Family Medicine

## 2020-01-13 ENCOUNTER — Other Ambulatory Visit: Payer: Self-pay

## 2020-01-13 VITALS — BP 110/66 | HR 54 | Temp 98.2°F | Ht 67.5 in | Wt 165.0 lb

## 2020-01-13 DIAGNOSIS — M11269 Other chondrocalcinosis, unspecified knee: Secondary | ICD-10-CM

## 2020-01-13 DIAGNOSIS — I480 Paroxysmal atrial fibrillation: Secondary | ICD-10-CM | POA: Diagnosis not present

## 2020-01-13 DIAGNOSIS — Z7901 Long term (current) use of anticoagulants: Secondary | ICD-10-CM | POA: Diagnosis not present

## 2020-01-13 DIAGNOSIS — I058 Other rheumatic mitral valve diseases: Secondary | ICD-10-CM | POA: Diagnosis not present

## 2020-01-13 DIAGNOSIS — M170 Bilateral primary osteoarthritis of knee: Secondary | ICD-10-CM

## 2020-01-13 MED ORDER — METHYLPREDNISOLONE ACETATE 40 MG/ML IJ SUSP
80.0000 mg | Freq: Once | INTRAMUSCULAR | Status: AC
  Administered 2020-01-13: 80 mg via INTRA_ARTICULAR

## 2020-01-28 ENCOUNTER — Other Ambulatory Visit: Payer: Self-pay | Admitting: Internal Medicine

## 2020-01-28 NOTE — Telephone Encounter (Signed)
Refill request

## 2020-02-12 ENCOUNTER — Other Ambulatory Visit: Payer: Self-pay

## 2020-02-12 ENCOUNTER — Ambulatory Visit (INDEPENDENT_AMBULATORY_CARE_PROVIDER_SITE_OTHER): Payer: Medicare HMO

## 2020-02-12 DIAGNOSIS — Z7901 Long term (current) use of anticoagulants: Secondary | ICD-10-CM | POA: Diagnosis not present

## 2020-02-12 DIAGNOSIS — Z5181 Encounter for therapeutic drug level monitoring: Secondary | ICD-10-CM

## 2020-02-12 DIAGNOSIS — I4891 Unspecified atrial fibrillation: Secondary | ICD-10-CM | POA: Diagnosis not present

## 2020-02-12 LAB — POCT INR: INR: 2.6 (ref 2.0–3.0)

## 2020-02-12 NOTE — Patient Instructions (Signed)
-   continue dosage of warfarin 1 tablet daily EXCEPT 1/2 tablet on MONDAYS & FRIDAYS. - recheck in 6 weeks.  

## 2020-02-26 ENCOUNTER — Other Ambulatory Visit: Payer: Self-pay | Admitting: Family Medicine

## 2020-03-06 ENCOUNTER — Other Ambulatory Visit: Payer: Self-pay | Admitting: Family Medicine

## 2020-03-09 ENCOUNTER — Other Ambulatory Visit: Payer: Self-pay | Admitting: Family Medicine

## 2020-03-25 ENCOUNTER — Ambulatory Visit (INDEPENDENT_AMBULATORY_CARE_PROVIDER_SITE_OTHER): Payer: Medicare HMO

## 2020-03-25 ENCOUNTER — Other Ambulatory Visit: Payer: Self-pay

## 2020-03-25 DIAGNOSIS — Z5181 Encounter for therapeutic drug level monitoring: Secondary | ICD-10-CM

## 2020-03-25 DIAGNOSIS — I4891 Unspecified atrial fibrillation: Secondary | ICD-10-CM | POA: Diagnosis not present

## 2020-03-25 DIAGNOSIS — Z7901 Long term (current) use of anticoagulants: Secondary | ICD-10-CM

## 2020-03-25 LAB — POCT INR: INR: 2.1 (ref 2.0–3.0)

## 2020-03-25 NOTE — Patient Instructions (Signed)
-   continue dosage of warfarin 1 tablet daily EXCEPT 1/2 tablet on MONDAYS & FRIDAYS. - recheck in 6 weeks.  

## 2020-04-13 ENCOUNTER — Telehealth: Payer: Self-pay

## 2020-04-13 NOTE — Telephone Encounter (Signed)
Agree with poc

## 2020-04-13 NOTE — Telephone Encounter (Signed)
Pt left v/m that she twisted her knee and difficult to walk on that leg; I spoke with pt and she twisted her lt knee on 04/10/20; pt had gotten down on knees and when pt was getting up she twisted her knee; lt knee is swollen and no redness seen. Pain level now 6 - 7. Pt is walking with a cane and can bear some wt on lt leg now. Pt only wants to see Dr Lorelei Pont; pt scheduled in office appt on 04/15/20 at 3:20 with Dr Lorelei Pont. Pt has no covid symptoms, no travel and no known exposure to + covid. Pt had covid vaccines on 09/04/19 and 09/23/19. UC & ED precautions given and pt voiced understanding. FYI to Dr Lorelei Pont.

## 2020-04-15 ENCOUNTER — Encounter: Payer: Self-pay | Admitting: Family Medicine

## 2020-04-15 ENCOUNTER — Other Ambulatory Visit: Payer: Self-pay

## 2020-04-15 ENCOUNTER — Ambulatory Visit: Payer: Medicare HMO | Admitting: Family Medicine

## 2020-04-15 ENCOUNTER — Ambulatory Visit (INDEPENDENT_AMBULATORY_CARE_PROVIDER_SITE_OTHER)
Admission: RE | Admit: 2020-04-15 | Discharge: 2020-04-15 | Disposition: A | Discharge status: Home / Self Care | Payer: Medicare HMO | Source: Ambulatory Visit | Attending: Family Medicine | Admitting: Family Medicine

## 2020-04-15 VITALS — BP 120/66 | HR 61 | Temp 97.2°F | Ht 67.5 in | Wt 162.5 lb

## 2020-04-15 DIAGNOSIS — M25562 Pain in left knee: Secondary | ICD-10-CM | POA: Diagnosis not present

## 2020-04-15 DIAGNOSIS — M25462 Effusion, left knee: Secondary | ICD-10-CM

## 2020-04-15 DIAGNOSIS — I5042 Chronic combined systolic (congestive) and diastolic (congestive) heart failure: Secondary | ICD-10-CM | POA: Diagnosis not present

## 2020-04-15 DIAGNOSIS — M170 Bilateral primary osteoarthritis of knee: Secondary | ICD-10-CM

## 2020-04-15 DIAGNOSIS — Z7901 Long term (current) use of anticoagulants: Secondary | ICD-10-CM | POA: Diagnosis not present

## 2020-04-15 MED ORDER — METHYLPREDNISOLONE ACETATE 40 MG/ML IJ SUSP
80.0000 mg | Freq: Once | INTRAMUSCULAR | Status: AC
  Administered 2020-04-15: 80 mg via INTRA_ARTICULAR

## 2020-04-15 NOTE — Progress Notes (Signed)
[name] T. Georg Ang, MD, [place] at [facility] [place], [address]  Phone: [phone]  FAX: [phone]  [name] - 74 y.o. female  MRN [id number]  Date of Birth: 04-11-1946  Date: 04/15/2020  PCP: Owens Loffler, MD  Referral: Owens Loffler, MD  Chief Complaint  Patient presents with  . Knee Pain    Twisted Left Knee on Friday    This visit occurred during the SARS-CoV-2 public health emergency.  Safety protocols were in place, including screening questions prior to the visit, additional usage of staff PPE, and extensive cleaning of exam room while observing appropriate contact time as indicated for disinfecting solutions.   Subjective:   [name] is a 74 y.o. very pleasant female patient with Body mass index is 25.08 kg/m. who presents with the following:  Catching a baby chicked and she did this on Friday and has some medial joint line pain.  Some pain across the knee.   She is a well-known patient, quite pleasant.  Last Friday she was attempting to catch a baby chicken and while on the ground she twisted her knee.  She predominantly has had some medial joint line tenderness, and she has also had an effusion.  At baseline she does have some osteoarthritis bilaterally.  She has had some difficulty walking and she is having a limp.  Her pain is quite significant.  He did not feel any movement or shifting in the knee or at the patella.  She is unclear if she has had any other form of mechanical symptoms.  Aside from this, she does not have any major history of significant knee problems in the past.  She is on amiodarone as well as Coumadin.  Does have congestive heart failure as well as a prior history of mitral valve replacement.  Review of Systems is noted in the HPI, as appropriate   Objective:   BP 120/66   Pulse 61   Temp (!) 97.2 F (36.2 C) (Temporal)    Ht 5' 7.5" (1.715 m)   Wt 162 lb 8 oz (73.7 kg)   SpO2 98%   BMI 25.08 kg/m    GEN: No acute distress; alert,appropriate. PULM: Breathing comfortably in no respiratory distress PSYCH: Normally interactive.    Left knee: The patient lacks 5 degrees of extension and she is able to flex to 80 degrees.  She has a large ballotable effusion.  She has notable tenderness at the medial joint line.  Lachman is negative, PCL is intact as well as stable to varus and valgus stress.  Nontender at the tibial plateau as well as at the proximal fibula.  Quad tendon and patellar tendon are intact.  Other aspects of the physical exam are limited by pain or loss of motion.  Radiology: No results found.  Assessment and Plan:     ICD-10-CM   1. Acute pain of left knee  M25.562 DG Knee Complete 4 Views Left  2. Chronic combined systolic and diastolic CHF, NYHA class 1 (HCC)  I50.42   3. Current use of long term anticoagulation  Z79.01   4. Primary osteoarthritis of both knees  M17.0 methylPREDNISolone acetate (DEPO-MEDROL) injection 80 mg  5. Knee effusion, left  M25.462 DG Knee Complete 4 Views Left    methylPREDNISolone acetate (DEPO-MEDROL) injection 80 mg   X-ray findings show no acute fracture.  She does have some mild tricompartmental  osteoarthritis.  There appears to be an effusion as well on plain film.  Unable to take NSAIDs secondary to chronic anticoagulation.  She has quite large effusion and I think aspirating this would provide her some almost immediate relief of symptoms.  I am going to aspirate the patient's knee.  Placed the patient in a patellar J brace with follow-up in 3 weeks.  Aspiration/Injection Procedure Note [name] 02/17/1946 Date of procedure: 04/15/2020  Procedure: Large Joint Aspiration / Injection with synovial fluid aspiration of knee, L Indications: Pain  Procedure Details Patient verbally consented; risks, benefits, and alternatives explained including  possible infection. Patient prepped with Chloraprep. Ethyl chloride for anesthesia. 10 cc of 1% Lidocaine used in wheal then injected Subcutaneous fashion with 22 gauge needle on lateral approach. Under sterilne conditions, 18 gauge needle used via lateral approach to aspirate 35 cc cc of yellowish synovial fluid. Then 8 cc of Lidocaine 1% and 2 mL of Depo-Medrol 40 mg injected. Tolerated well, decreased pain, no complications. Medication: 2 mL of Depo-Medrol 40 mg, equaling Depo-Medrol 80 mg total   Follow-up: No follow-ups on file.  Meds ordered this encounter  Medications  . methylPREDNISolone acetate (DEPO-MEDROL) injection 80 mg   There are no discontinued medications. Orders Placed This Encounter  Procedures  . DG Knee Complete 4 Views Left    Signed,  Conda Wannamaker T. Alyzah Pelly, MD   Outpatient Encounter Medications as of 04/15/2020  Medication Sig  . amiodarone (PACERONE) 200 MG tablet Take 1 tablet (200 mg total) by mouth daily.  Marland Kitchen b complex vitamins tablet Take 1 tablet daily by mouth.  . Calcium Citrate-Vitamin D (CALCIUM + D PO) Take 1 capsule daily by mouth.   . carvedilol (COREG) 3.125 MG tablet TAKE 1 TABLET BY MOUTH TWICE A DAY  . donepezil (ARICEPT) 5 MG tablet TAKE 1 TABLET BY MOUTH ONCE DAILY AT BEDTIME  . fluticasone (FLONASE) 50 MCG/ACT nasal spray Place 2 sprays into both nostrils daily.  . IRON, FERROUS SULFATE, PO Take 1 tablet by mouth daily.  Marland Kitchen levothyroxine (SYNTHROID) 50 MCG tablet Take 1 tablet (50 mcg total) by mouth daily.  Marland Kitchen losartan (COZAAR) 25 MG tablet TAKE 1 TABLET BY MOUTH ONCE A DAY  . metroNIDAZOLE (METROCREAM) 0.75 % cream Apply topically 2 (two) times daily.  . Omega-3 Fatty Acids (FISH OIL PO) Take 1 capsule by mouth daily.  . permethrin (ELIMITE) 5 % cream APPLY PEA SIZED AMOUNT TO FACE ONCE A DAY  . traZODone (DESYREL) 50 MG tablet TAKE 1/2 TO 1 TABLET BY MOUTH AT BEDTIMEAS NEEDED FOR SLEEP  . warfarin (COUMADIN) 5 MG tablet TAKE 1/2 TO 1 TABLET  BY MOUTH DAILY AS DIRECTED BY COUMADIN CLINIC  . [EXPIRED] methylPREDNISolone acetate (DEPO-MEDROL) injection 80 mg    No facility-administered encounter medications on file as of 04/15/2020.

## 2020-04-20 ENCOUNTER — Telehealth: Payer: Self-pay | Admitting: Family Medicine

## 2020-04-20 NOTE — Telephone Encounter (Signed)
I would try to keep walking normally, work on motion, and give this more time.  I am not surprised that she is not better.  She will likely have good days and bad days, but the more important thing is for her to slowly get better.

## 2020-04-20 NOTE — Telephone Encounter (Signed)
Patient called in stating that her knee is not improving even when in the brace. Patient scheduled for 8/26 but patient would like to inform [name] and see if any other recommendations can be given.

## 2020-04-20 NOTE — Telephone Encounter (Signed)
[name] notified as instructed by telephone.  Patient states understanding.

## 2020-04-23 ENCOUNTER — Other Ambulatory Visit: Payer: Self-pay

## 2020-04-23 ENCOUNTER — Encounter: Payer: Self-pay | Admitting: Family Medicine

## 2020-04-23 ENCOUNTER — Ambulatory Visit: Payer: Medicare HMO | Admitting: Family Medicine

## 2020-04-23 VITALS — BP 116/64 | HR 53 | Temp 97.3°F | Ht 67.5 in | Wt 161.5 lb

## 2020-04-23 DIAGNOSIS — M25562 Pain in left knee: Secondary | ICD-10-CM | POA: Diagnosis not present

## 2020-04-23 DIAGNOSIS — M1712 Unilateral primary osteoarthritis, left knee: Secondary | ICD-10-CM

## 2020-04-23 DIAGNOSIS — M2392 Unspecified internal derangement of left knee: Secondary | ICD-10-CM | POA: Diagnosis not present

## 2020-04-23 NOTE — Progress Notes (Signed)
Deborah Voges T. [name], MD, [place] at [facility] [place], [address]  Phone: [phone]   FAX: [phone]  [name] - 74 y.o. female   MRN [id number]   Date of Birth: 1946-03-20  Date: 04/23/2020   PCP: Owens Loffler, MD   Referral: Owens Loffler, MD  Chief Complaint  Patient presents with   Follow-up    left knee pain    This visit occurred during the SARS-CoV-2 public health emergency.  Safety protocols were in place, including screening questions prior to the visit, additional usage of staff PPE, and extensive cleaning of exam room while observing appropriate contact time as indicated for disinfecting solutions.   Subjective:   [name] is a 74 y.o. very pleasant female patient with Body mass index is 24.92 kg/m. who presents with the following:  She is a well-known patient and she does have some baseline mild osteoarthritis.  I saw her about 3 weeks ago and at that point she was having quite a bit of an effusion and pain.  This was after she was on her knees and trying to catch a baby chicken.  She is here to follow-up about this today.  Her motion is much improved, her effusion is improved, but she is still getting pain that gets worse and worse during the day.  She is still using her cane, which she normally does not do. On her last office visit I did aspirate 35 cc of synovial fluid, and I did inject her knee with Depo-medrol.  Gets wors during the day.  She is still using her cane.    Full ext and flexion to 130.  Review of Systems is noted in the HPI, as appropriate   Objective:   BP 116/64    Pulse (!) 53    Temp (!) 97.3 F (36.3 C) (Temporal)    Ht 5' 7.5" (1.715 m)    Wt 161 lb 8 oz (73.3 kg)    SpO2 98%    BMI 24.92 kg/m   GEN: No acute distress; alert,appropriate. PULM: Breathing comfortably in no respiratory distress PSYCH: Normally  interactive.    Left knee: full extension and flexion to 130 degrees. Mild effusion.  Medial > lateral joint line tenderness. ACL, PCL, MCL, and LCL are intact.  She does have pain with deep flexion, but much less compared to prior exam.  Bounce home testing and mcmurrays do not cause pain.  Radiology: DG Knee Complete 4 Views Left  Result Date: 04/16/2020 CLINICAL DATA:  Pain following twisting injury EXAM: LEFT KNEE - COMPLETE 4+ VIEW COMPARISON:  October 10, 2016 FINDINGS: Frontal, tunnel, bilateral oblique, and lateral images were obtained. There is no appreciable fracture or dislocation. There is a small joint effusion. There is mild narrowing of the patellofemoral joint. Other joint spaces appear normal. There is mild spurring in all compartments. There is a degree of chondrocalcinosis. IMPRESSION: 1.  Small joint effusion.  No fracture or dislocation. 2. Mild spurring in all compartments. Mild narrowing patellofemoral joint. 3. Chondrocalcinosis, a finding that may be seen with osteoarthritis or with calcium pyrophosphate deposition disease. Electronically Signed   By: Lowella Grip III M.D.   On: 04/16/2020 13:43    Assessment and Plan:     ICD-10-CM   1. Internal derangement of left knee  M23.92 Ambulatory referral to Physical Therapy  2. Acute pain of left knee  M25.562 Ambulatory referral to Physical Therapy  3. Primary osteoarthritis of left knee  M17.12 Ambulatory referral to Physical Therapy   Knee is improving clinically.  ROM and exam are reassuring, but not back to normal.  Continue conservative care, ROM, basic strenghthening.  Follow-up: Return in about 6 weeks (around 06/04/2020).  No orders of the defined types were placed in this encounter.  There are no discontinued medications. Orders Placed This Encounter  Procedures   Ambulatory referral to Physical Therapy    Signed,  Roselene Gray T. Roldan Laforest, MD   Outpatient Encounter Medications as of 04/23/2020    Medication Sig   amiodarone (PACERONE) 200 MG tablet Take 1 tablet (200 mg total) by mouth daily.   b complex vitamins tablet Take 1 tablet daily by mouth.   Calcium Citrate-Vitamin D (CALCIUM + D PO) Take 1 capsule daily by mouth.    carvedilol (COREG) 3.125 MG tablet TAKE 1 TABLET BY MOUTH TWICE A DAY   donepezil (ARICEPT) 5 MG tablet TAKE 1 TABLET BY MOUTH ONCE DAILY AT BEDTIME   fluticasone (FLONASE) 50 MCG/ACT nasal spray Place 2 sprays into both nostrils daily.   IRON, FERROUS SULFATE, PO Take 1 tablet by mouth daily.   levothyroxine (SYNTHROID) 50 MCG tablet Take 1 tablet (50 mcg total) by mouth daily.   losartan (COZAAR) 25 MG tablet TAKE 1 TABLET BY MOUTH ONCE A DAY   metroNIDAZOLE (METROCREAM) 0.75 % cream Apply topically 2 (two) times daily.   Omega-3 Fatty Acids (FISH OIL PO) Take 1 capsule by mouth daily.   permethrin (ELIMITE) 5 % cream APPLY PEA SIZED AMOUNT TO FACE ONCE A DAY   traZODone (DESYREL) 50 MG tablet TAKE 1/2 TO 1 TABLET BY MOUTH AT BEDTIMEAS NEEDED FOR SLEEP   warfarin (COUMADIN) 5 MG tablet TAKE 1/2 TO 1 TABLET BY MOUTH DAILY AS DIRECTED BY COUMADIN CLINIC   No facility-administered encounter medications on file as of 04/23/2020.

## 2020-04-27 ENCOUNTER — Telehealth: Payer: Self-pay | Admitting: *Deleted

## 2020-04-27 NOTE — Telephone Encounter (Signed)
Patient called stating that she saw Dr. Lorelei Pont last week and he was referring her for physical therapy. Patient stated that she has not heard anything about the referral. Patient requested a call back to get this set up because her knee is really bothering her. Patient stated that there is a PT place in [place] by the name of [facility].

## 2020-04-28 NOTE — Telephone Encounter (Signed)
Please note, sign and close encounter after contacting patient.

## 2020-04-29 NOTE — Telephone Encounter (Signed)
Pt has been scheduled and notified of her appt/date/time/location..  Nothing further needed.

## 2020-05-13 ENCOUNTER — Ambulatory Visit (INDEPENDENT_AMBULATORY_CARE_PROVIDER_SITE_OTHER): Payer: Medicare HMO

## 2020-05-13 ENCOUNTER — Other Ambulatory Visit: Payer: Self-pay

## 2020-05-13 DIAGNOSIS — I4891 Unspecified atrial fibrillation: Secondary | ICD-10-CM

## 2020-05-13 DIAGNOSIS — Z5181 Encounter for therapeutic drug level monitoring: Secondary | ICD-10-CM

## 2020-05-13 DIAGNOSIS — Z7901 Long term (current) use of anticoagulants: Secondary | ICD-10-CM | POA: Diagnosis not present

## 2020-05-13 LAB — POCT INR: INR: 1.7 — AB (ref 2.0–3.0)

## 2020-05-13 NOTE — Patient Instructions (Signed)
-   take 2 tablets warfarin tonight, then  - continue dosage of warfarin 1 tablet daily EXCEPT 1/2 tablet on [place]. - recheck in 4 weeks.

## 2020-05-14 ENCOUNTER — Other Ambulatory Visit: Payer: Self-pay | Admitting: *Deleted

## 2020-05-14 NOTE — Telephone Encounter (Signed)
Last office visit 04/23/2020 for follow up left knee internal derangement.  Last refilled 06/19/2018 for 45 g with 5 refills.  No future appointments with PCP.

## 2020-05-15 MED ORDER — METRONIDAZOLE 0.75 % EX CREA: TOPICAL_CREAM | Freq: Two times a day (BID) | CUTANEOUS | 5 refills | Status: AC

## 2020-05-22 ENCOUNTER — Other Ambulatory Visit: Payer: Self-pay | Admitting: Internal Medicine

## 2020-05-22 ENCOUNTER — Other Ambulatory Visit: Payer: Self-pay | Admitting: Family Medicine

## 2020-05-22 NOTE — Telephone Encounter (Signed)
Please review for refill on Warfarin. Thanks!

## 2020-06-08 ENCOUNTER — Telehealth: Payer: Self-pay | Admitting: Family Medicine

## 2020-06-08 NOTE — Telephone Encounter (Signed)
Please schedule MWV with nurse and CPE with Dr. [name].  °

## 2020-06-10 ENCOUNTER — Ambulatory Visit (INDEPENDENT_AMBULATORY_CARE_PROVIDER_SITE_OTHER): Payer: Medicare HMO

## 2020-06-10 ENCOUNTER — Other Ambulatory Visit: Payer: Self-pay

## 2020-06-10 DIAGNOSIS — Z7901 Long term (current) use of anticoagulants: Secondary | ICD-10-CM

## 2020-06-10 DIAGNOSIS — I4891 Unspecified atrial fibrillation: Secondary | ICD-10-CM | POA: Diagnosis not present

## 2020-06-10 DIAGNOSIS — Z5181 Encounter for therapeutic drug level monitoring: Secondary | ICD-10-CM

## 2020-06-10 LAB — POCT INR: INR: 2.7 (ref 2.0–3.0)

## 2020-06-10 NOTE — Patient Instructions (Signed)
-   continue dosage of warfarin 1 tablet daily EXCEPT 1/2 tablet on [place]. - recheck in 5 weeks.

## 2020-06-24 NOTE — Telephone Encounter (Signed)
Labs 12/15 Medicare wellness12/17 cpx 12/20  Pt aware

## 2020-07-04 ENCOUNTER — Other Ambulatory Visit: Payer: Self-pay | Admitting: Family

## 2020-07-04 ENCOUNTER — Other Ambulatory Visit: Payer: Self-pay | Admitting: Family Medicine

## 2020-07-15 ENCOUNTER — Other Ambulatory Visit: Payer: Self-pay

## 2020-07-15 ENCOUNTER — Ambulatory Visit (INDEPENDENT_AMBULATORY_CARE_PROVIDER_SITE_OTHER): Payer: Medicare HMO

## 2020-07-15 DIAGNOSIS — I4891 Unspecified atrial fibrillation: Secondary | ICD-10-CM

## 2020-07-15 DIAGNOSIS — Z7901 Long term (current) use of anticoagulants: Secondary | ICD-10-CM

## 2020-07-15 DIAGNOSIS — Z5181 Encounter for therapeutic drug level monitoring: Secondary | ICD-10-CM | POA: Diagnosis not present

## 2020-07-15 LAB — POCT INR: INR: 2 (ref 2.0–3.0)

## 2020-07-15 NOTE — Patient Instructions (Signed)
-   continue dosage of warfarin 1 tablet daily EXCEPT 1/2 tablet on MONDAYS & FRIDAYS. - recheck in 6 weeks.  

## 2020-07-16 ENCOUNTER — Ambulatory Visit (INDEPENDENT_AMBULATORY_CARE_PROVIDER_SITE_OTHER): Payer: Medicare HMO | Admitting: Physician Assistant

## 2020-07-16 ENCOUNTER — Encounter: Payer: Self-pay | Admitting: Physician Assistant

## 2020-07-16 VITALS — BP 110/60 | HR 51 | Ht 67.5 in | Wt 161.0 lb

## 2020-07-16 DIAGNOSIS — I428 Other cardiomyopathies: Secondary | ICD-10-CM

## 2020-07-16 DIAGNOSIS — I10 Essential (primary) hypertension: Secondary | ICD-10-CM

## 2020-07-16 DIAGNOSIS — Z79899 Other long term (current) drug therapy: Secondary | ICD-10-CM

## 2020-07-16 DIAGNOSIS — I058 Other rheumatic mitral valve diseases: Secondary | ICD-10-CM

## 2020-07-16 DIAGNOSIS — I480 Paroxysmal atrial fibrillation: Secondary | ICD-10-CM

## 2020-07-16 MED ORDER — AMIODARONE HCL 200 MG PO TABS
200.0000 mg | ORAL_TABLET | Freq: Every day | ORAL | 11 refills | Status: DC
Start: 2020-07-16 — End: 2021-08-03

## 2020-07-16 NOTE — Progress Notes (Signed)
Office Visit    Patient Name: [name] Date of Encounter: 07/16/2020  Primary Care Provider:  Owens Loffler, [name] Primary Cardiologist:  Deborah Bush, [name]  Chief Complaint    Chief Complaint  Patient presents with  . Follow-up    12 month/ Meds reviewed by the pt. verbally. 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  Past Medical History    Past Medical History:  Diagnosis Date  . Acne rosacea   . Anxiety   . Asthma   . Atrial fibrillation and flutter ([place]) 09/2014   Revereted from Afib RVR to 2:1 A Flutter -- TEE/ DCCV 2/9; on Amiodarone  . Chronic combined systolic and diastolic CHF, NYHA class 1 (HCC) -- Systolic dysfunction [id number][C16.38] 09/2014   Exacerbation 09/2014 2/2 Afib/flutter with RVR - 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 Mod LA dilation, normal PA pressures   . CVA (cerebral infarction)    h/o superior cerebellar infarct  . Depression   . Digoxin toxicity 09/2014  . 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 Diverticulosis   . H/O: rheumatic fever    Childhood  . HYPERLIPIDEMIA 04/05/2010   Qualifier: Diagnosis of  By: [name]    . Hypothyroid    On replacement therapy; normal TSH 09/2014  . Migraine headache   .  Osteopenia   . Paroxysmal atrial fibrillation ([place]) 2005; Recurrent 09/2014   a) 2005: s/p Cox Maze & LAA ligation; b) 09/2014: TEE-cardioversion  . Rheumatic mitral and aortic valve insufficiency 2005   a) s/p MV Ring repair; with Cox-Maze for Afib; b) Echo 2013: EF 60-65%,no Regional WMA, Gr 2 DD, no Sig MR ;; c) TEE 10/07/2014: Severlely thickened and calcified MV leaflets. Posterior MV leaflet is fixed and immobile. MAC. reduced excursion of the anterior MV leaflet. Mean MV gradient was 49m Hg and MVA calculated at cm2.    . Urinary incontinence    Past Surgical History:  Procedure Laterality Date  . ABDOMINAL HYSTERECTOMY    . CARDIAC CATHETERIZATION  Jan 2005   Pre-op R&LHC -- Nonobstructive CAD; normal PA pressures  . CARDIOVERSION  06/04/2012   Procedure: CARDIOVERSION;  Surgeon: Deborah Bjornstad [name];  [facility];  Service: Cardiovascular;  Laterality: N/A;  . CARDIOVERSION N/A 10/07/2014   Procedure: CARDIOVERSION;  Surgeon: [name];  [place]  Service: Cardiovascular;  Laterality: N/A;  . COLONOSCOPY WITH PROPOFOL N/A 07/11/2017   Procedure: COLONOSCOPY WITH PROPOFOL;  Surgeon: [name];  Location: [facility] ENDOSCOPY;  Service: Gastroenterology;  Laterality: N/A;  . LUMBAR DISC SURGERY     x 2 distantly  . MITRAL VALVE REPAIR  2005   with [facility] for [employer] [facility] [place] [employer]  4/7/'16   EF 37% with septal hypokinesis and  diffuse hypokinesis. No ischemia or infarction. 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  Marland Kitchen RIGHT HEART CATHETERIZATION N/A 10/03/2014   Procedure: RIGHT HEART CATH;  Surgeon: [name];  [facility];  Service: Cardiovascular;  RAP 78mHg, RVP 35/2/9 mmHg, PAP 45/12 mmHg, PCWP 16 mmHg; CO/I by Fick: 3.9/2.3; Ao/PA/SVC SaO2%: 97%/63%/65%.  . TEE WITHOUT CARDIOVERSION N/A 10/07/2014   Procedure: TRANSESOPHAGEAL ECHOCARDIOGRAM (TEE);  Surgeon: [name];  [facility];  Service: Cardiovascular;  Laterality: N/A;  . THYROIDECTOMY, PARTIAL     partial-no cancer; now on thyroid replacement  . TRANSTHORACIC ECHOCARDIOGRAM  2013; 2/'16 & 5/'16   a) 2013: EF 60-65%, mild MR, Gr 2 DD; [place]10% - severel global LV dysfunction (systolic & diastolic) - dilated LV & restrictive filling pattern - Gr 3 DD, mod reduced RV function, severely dilated LA - 6.4 cm, mod dilated RA, mod pl effusion, mod MR, mild TR; c) 5/10/'16:  EF 25-30%, mod LV dilation, no RWMA,Gr 3 DD w/ high LAP, MV sewing ring intact w/ Mod MR, Severe LA dilation  . TRANSTHORACIC ECHOCARDIOGRAM  January 2017:   EF improved to 45-50%. Mild diffuse HK. Mild MR. Moderate LA dilation. Normal PA pressures.    Allergies  No Known Allergies  History of Present Illness    Deborah NERIOis a 74y.o. female with PMH as above.   Her LVEF was as low as 15%.  08/2015 echo EF improved to 45 to 50%.  06/2017 echo LVEF improved to 50 to 55%.  She had a chest x-ray 06/2018 without acute abnormalities.  PFTs 02/20/2019 without obvious evidence of obstructive or restrictive disease.  On last seen in the office, she was doing well from a cardiac standpoint.  She felt her blood pressure was elevated due to the walking to the office.  She reported some trouble with arthritis in her knees.  Today, 07/16/2020, she returns to clinic and notes she is overall doing well from a cardiac standpoint.  She denies CP, presyncope, syncope, or falls.  She reports ongoing bilateral knee pain, attributed to her bad knees.  She has noticed some dyspnea with exertion, which she self-reports as 2/2 her knee pain.  Unfortunately, she is unable to recall any other exertional activities that do not impact her knees, which would assist with further clarification/data regarding her DOE.  She denies any exertional chest pain.  She notes occasional racing heart rate and palpitations.  She continues  on amiodarone with reports that she has upcoming labs through her PCP, at which time she can collect her monitoring labs (liver function, TSH, free T4).  She denies a recent eye exam with monitoring guidelines reviewed regarding recommendation for annual eye exam, PFTs, and labs.  She denies any signs or symptoms of bleeding on anticoagulation.  She reports consistent follow-up with the Coumadin clinic.    Home Medications    Current Outpatient Medications on File Prior to Visit  Medication Sig Dispense Refill  . amiodarone (PACERONE) 200 MG tablet Take 1 tablet (200 mg total) by mouth daily. Please schedule office visit for further refills. Thank you! 30 tablet 0  . b complex vitamins tablet Take 1 tablet daily by mouth.    . Calcium Citrate-Vitamin D (CALCIUM + D PO) Take 1 capsule daily by mouth.     . carvedilol (COREG) 3.125 MG tablet TAKE 1 TABLET BY MOUTH TWICE A  DAY 180 tablet 1  . donepezil (ARICEPT) 5 MG tablet TAKE 1 TABLET BY MOUTH ONCE DAILY AT BEDTIME 90 tablet 0  . fluticasone (FLONASE) 50 MCG/ACT nasal spray Place 2 sprays into both nostrils daily. 48 g 3  . IRON, FERROUS SULFATE, PO Take 1 tablet by mouth daily.    Marland Kitchen levothyroxine (SYNTHROID) 50 MCG tablet TAKE 1 TAB BY MOUTH ONCE DAILY. TAKE ON AN EMPTY STOMACH WITH A GLASS OF WATER ATLEAST 30-60 MINUTES BEFORE BREAKFAST 90 tablet 0  . losartan (COZAAR) 25 MG tablet TAKE 1 TABLET BY MOUTH ONCE A DAY 90 tablet 0  . metroNIDAZOLE (METROCREAM) 0.75 % cream Apply topically 2 (two) times daily. 45 g 5  . Omega-3 Fatty Acids (FISH OIL PO) Take 1 capsule by mouth daily.    . permethrin (ELIMITE) 5 % cream APPLY PEA SIZED AMOUNT TO FACE ONCE A DAY  1  . traZODone (DESYREL) 50 MG tablet TAKE 1/2 TO 1 TABLET BY MOUTH AT BEDTIMEAS NEEDED FOR SLEEP 30 tablet 2  . warfarin (COUMADIN) 5 MG tablet TAKE 1/2 TO 1 TABLET BY MOUTH DAILY AS DIRECTED BY COUMADIN CLINIC 90 tablet 1   No current facility-administered medications on file prior to  visit.    Review of Systems    She reports occasional racing heart rate and palpitations.  She reports dyspnea with exertion, which she self attributes to her knee pain.  She denies chest pain,  pnd, orthopnea, n, v, dizziness, syncope, edema, weight gain, or early satiety.    All other systems reviewed and are otherwise negative except as noted above.  Physical Exam    VS:  BP 110/60 (BP Location: Left Arm, Patient Position: Sitting, Cuff Size: Normal)   Pulse (!) 51   Ht 5' 7.5" (1.715 m)   Wt 161 lb (73 kg)   SpO2 98%   BMI 24.84 kg/m  , BMI Body mass index is 24.84 kg/m. GEN: Well nourished, well developed, in no acute distress. HEENT: normal. Neck: Supple, no JVD, carotid bruits, or masses. Cardiac: Bradycardic but regular.  1/6 systolic murmur.  No rubs, or gallops. No clubbing, cyanosis, pitting edema.  Radials/DP/PT 2+ and equal bilaterally.  Respiratory:  Respirations regular and unlabored, clear to auscultation bilaterally. GI: Soft, nontender, nondistended, BS + x 4. MS: no deformity or atrophy. Skin: warm and dry, no rash. Neuro:  Strength and sensation are intact. Psych: Normal affect.  Accessory Clinical Findings    ECG personally reviewed by me today - SB, 1st degree AVB with PRi 234 and 51bpm, QTc 444, QRS 100  - no acute changes.  VITALS Reviewed today   Temp Readings from Last 3 Encounters:  04/23/20 (!) 97.3 F (36.3 C) (Temporal)  04/15/20 (!) 97.2 F (36.2 C) (Temporal)  01/13/20 98.2 F (36.8 C) (Temporal)   BP Readings from Last 3 Encounters:  07/16/20 110/60  04/23/20 116/64  04/15/20 120/66   Pulse Readings from Last 3 Encounters:  07/16/20 (!) 51  04/23/20 (!) 53  04/15/20 61    Wt Readings from Last 3 Encounters:  07/16/20 161 lb (73 kg)  04/23/20 161 lb 8 oz (73.3 kg)  04/15/20 162 lb 8 oz (73.7 kg)     LABS  reviewed today   Lab Results  Component Value Date   WBC 4.1 11/07/2018   HGB 13.5 11/07/2018   HCT 40.3 11/07/2018    MCV 104.7 (H) 11/07/2018   PLT 180.0 11/07/2018   Lab Results  Component Value  Date   CREATININE 0.86 03/13/2019   BUN 24 (H) 03/13/2019   NA 139 03/13/2019   K 5.1 03/13/2019   CL 104 03/13/2019   CO2 30 03/13/2019   Lab Results  Component Value Date   ALT 19 03/13/2019   AST 15 03/13/2019   ALKPHOS 78 03/13/2019   BILITOT 0.7 03/13/2019   Lab Results  Component Value Date   CHOL 189 05/18/2018   HDL 65.90 05/18/2018   LDLCALC 102 (H) 05/18/2018   LDLDIRECT 132.3 03/31/2010   TRIG 103.0 05/18/2018   CHOLHDL 3 05/18/2018    Lab Results  Component Value Date   HGBA1C 5.6 03/13/2019   Lab Results  Component Value Date   TSH 8.57 (H) 05/27/2019     STUDIES/PROCEDURES reviewed today   Echo 06/2017 - Left ventricle: The cavity size was mildly dilated. Systolic  function was normal. The estimated ejection fraction was in the  range of 50% to 55%. Wall motion was normal; there were no  regional wall motion abnormalities. Left ventricular diastolic  function parameters were normal.  - Mitral valve: Prior procedures included surgical repair. There  was mild to moderate regurgitation.  - Left atrium: The atrium was moderately dilated.  - Right ventricle: Systolic function was normal.  - Pulmonary arteries: Systolic pressure was mildly elevated PA peak  pressure: 41 mm Hg (S).   Assessment & Plan    NICM  --Denies SOB with mild DOE, which she self attributes to her knee pain. NYHA class I-II given difficulty teasing out dyspnea due to pain versus cardiac etiology at this time and given knee pain was previously reported without DOE at prior office visits. Euvolemic and well compensated on exam.  BP/HR today well controlled.  Weight stable 08/12/2019 clinic visit at only a 2lb increase.  Continue current BB and ARB. No indication for initiation of a diuretic at this time. Discussed updating an echo with plan to reassess based on ongoing sx or at RTC. Will update  PFTs as below.  If progressive dyspnea with activities or CP with exertion, or if any new s/sx, recommendation is to call the office.  Discussed Na / fluid intake and recommendation to increase activity as tolerated.   Severe mitral regurgitation s/p MVR 2005  --Echo 06/2017 with moderate mitral regurgitation and mild pulmonary HTN. She does report some dyspnea, which she attributes to her knee pain. However, it is likely that the DOE is multifactorial, especially given it has not been reported with her knee pain in the past.  Euvolemic and well compensated today. No other sx reported for HF reported at this time. Stable 1/6 systolic murmur appreciated on exam. Consider repeat echo at RTC of if new or progressive sx as above. Continue current warfarin for indefinite anticoagulation, given history of Afib. Continue SBE prophylaxis for elective dental procedures.  Recommend heart rate and blood pressure control, as well as close monitoring of her fluid status.    Dyspnea --Reports DOE, attributed to her knee pain, but not reported at previous visits despite knee pain. DOE could be multifactorial in the setting of her knee pain, MR, COPD, PAF, amiodarone therapy, NICM, +/-  Deconditioning. Will update PFTs given amiodarone tx. Consider repeat echo at RTC or if ongoing or progressive DOE. Continue to monitor.   HTN --BP well controlled at 110/60.  No medication changes.  Goal BP 130/80 or lower. Continue to monitor sodium and fluid as discussed today.  Bradycardia --Bradycardia is not new for her  on review of EMR. Denies any symptoms of presyncope, syncope, or fatigue with HR 51 bpm.  Monitor closely given amiodarone and BB therapy. Reassess at RTC.   Paroxysmal atrial fibrillation/flutter --NSR today. Reports infrequent tachypalpitations. Continue current beta-blocker and amiodarone. Recommend updating monitoring labs and imaging/studies for amiodarone as detailed below.  Continue warfarin for  anticoagulation.  No reported signs or symptoms of bleeding.  Continue to follow-up with the Coumadin clinic.  Amiodarone therapy --Reports DOE, attributed to her knees, but not reported at previous visits. Considered is DOE 2/2 amiodarone therapy. 01/2019 PFTs WNL.  Will update PFTs. Recommend updating TSH, free T4, liver function panel. On review of EMR, her PCP may have requested we obtain all these monitoring studies rather than at her next PCP visit as reported by pt and we will need to clarify this today.  Discussed recommendation for annual eye exams as well.  Continue to monitor QT with EKG - current QTc 444.  Anticoagulation therapy --No s/sx of bleeding. No recent falls. Reviewed that she should call the office or her PCP if blood in stool, black tarry stools, blood in urine, nosebleeds or any other unusual bleedingy. Head scan is recommended if she falls and hits her head. Continue to follow with Coumadin clinic.  COPD --Reports DOE as above.  Reportedly followed by her PCP for COPD versus pulmonary.  Updating PFTs as above.   Hypothyroidism --Continue per PCP.  Recommendation today was for updating TSH/free T4 with patient preference to defer labs to her PCP. Per review of visit notes, it appears that her PCP requested for her to collect these labs through our clinic.  We plan to clarify this today and collect labs if requested by PCP.   Knee pain --Discussed Biofreeze. Discussed recommendation to avoid ibuprofen, Advil, Motrin, naproxen, and Aleve due to risk of stomach bleeding and instead take Tylenol as directed.   Disposition: RTC 1 year     Arvil Chaco, PA-C 07/16/2020

## 2020-07-16 NOTE — Patient Instructions (Addendum)
Medication Instructions:  Your physician recommends that you continue on your current medications as directed. Please refer to the Current Medication list given to you today.  *If you need a refill on your cardiac medications before your next appointment, please call your pharmacy*   Lab Work: Your physician recommends that you return for lab work on 08/14/20 when you arrive for your PFT @ [place]:  Liver panel & TSH    Testing/Procedures: Your physician has recommended that you have a pulmonary function test. Pulmonary Function Tests are a group of tests that measure how well air moves in and out of your lungs.   1) COVID PRE- TEST: You will need a COVID TEST prior to the procedure:  LOCATION: [facility].  DATE/TIME:  08/13/20 between 8AM - 1PM  2) Please arrive at the medical mall Friday 08/14/20 @ 12:45 PM for PFT     Follow-Up: At [facility], you and your health needs are our priority.  As part of our continuing mission to provide you with exceptional heart care, we have created designated Provider Care Teams.  These Care Teams include your primary Cardiologist (physician) and Advanced Practice Providers (APPs -  Physician Assistants and Nurse Practitioners) who all work together to provide you with the care you need, when you need it.  We recommend signing up for the patient portal called "MyChart".  Sign up information is provided on this After Visit Summary.  MyChart is used to connect with patients for Virtual Visits (Telemedicine).  Patients are able to view lab/test results, encounter notes, upcoming appointments, etc.  Non-urgent messages can be sent to your provider as well.   To learn more about what you can do with MyChart, go to [email or web address].    Your next appointment:   1 year(s)  The format for your next appointment:   In Person  Provider:   You may see Nelva Bush, MD or one of the following Advanced  Practice Providers on your designated Care Team:    Murray Hodgkins, NP  Christell Faith, PA-C  Marrianne Mood, PA-C  C[place], [place]  Laurann [place], NP

## 2020-07-29 ENCOUNTER — Other Ambulatory Visit: Payer: Self-pay

## 2020-07-29 ENCOUNTER — Ambulatory Visit (INDEPENDENT_AMBULATORY_CARE_PROVIDER_SITE_OTHER): Payer: Medicare HMO

## 2020-07-29 DIAGNOSIS — Z23 Encounter for immunization: Secondary | ICD-10-CM | POA: Diagnosis not present

## 2020-08-07 ENCOUNTER — Other Ambulatory Visit: Payer: Self-pay | Admitting: Family Medicine

## 2020-08-07 DIAGNOSIS — E785 Hyperlipidemia, unspecified: Secondary | ICD-10-CM

## 2020-08-07 DIAGNOSIS — Z79899 Other long term (current) drug therapy: Secondary | ICD-10-CM

## 2020-08-07 DIAGNOSIS — Z131 Encounter for screening for diabetes mellitus: Secondary | ICD-10-CM

## 2020-08-07 DIAGNOSIS — E559 Vitamin D deficiency, unspecified: Secondary | ICD-10-CM

## 2020-08-07 DIAGNOSIS — E039 Hypothyroidism, unspecified: Secondary | ICD-10-CM

## 2020-08-12 ENCOUNTER — Other Ambulatory Visit (INDEPENDENT_AMBULATORY_CARE_PROVIDER_SITE_OTHER): Payer: Medicare HMO

## 2020-08-12 ENCOUNTER — Other Ambulatory Visit: Payer: Self-pay

## 2020-08-12 DIAGNOSIS — Z131 Encounter for screening for diabetes mellitus: Secondary | ICD-10-CM | POA: Diagnosis not present

## 2020-08-12 DIAGNOSIS — Z79899 Other long term (current) drug therapy: Secondary | ICD-10-CM

## 2020-08-12 DIAGNOSIS — E785 Hyperlipidemia, unspecified: Secondary | ICD-10-CM

## 2020-08-12 DIAGNOSIS — E559 Vitamin D deficiency, unspecified: Secondary | ICD-10-CM

## 2020-08-12 DIAGNOSIS — E039 Hypothyroidism, unspecified: Secondary | ICD-10-CM

## 2020-08-12 LAB — BASIC METABOLIC PANEL
BUN: 20 mg/dL (ref 6–23)
CO2: 30 mEq/L (ref 19–32)
Calcium: 9 mg/dL (ref 8.4–10.5)
Chloride: 104 mEq/L (ref 96–112)
Creatinine, Ser: 0.91 mg/dL (ref 0.40–1.20)
GFR: 61.55 mL/min (ref >60.00)
Glucose, Bld: 89 mg/dL (ref 70–99)
Potassium: 4.7 mEq/L (ref 3.5–5.1)
Sodium: 140 mEq/L (ref 135–145)

## 2020-08-12 LAB — T4, FREE: Free T4: 0.93 ng/dL (ref 0.60–1.60)

## 2020-08-12 LAB — CBC WITH DIFFERENTIAL/PLATELET
Basophils Absolute: 0 10*3/uL (ref 0.0–0.1)
Basophils Relative: 0.7 % (ref 0.0–3.0)
Eosinophils Absolute: 0.2 10*3/uL (ref 0.0–0.7)
Eosinophils Relative: 2.3 % (ref 0.0–5.0)
HCT: 38.3 % (ref 36.0–46.0)
Hemoglobin: 12.7 g/dL (ref 12.0–15.0)
Lymphocytes Relative: 18.7 % (ref 12.0–46.0)
Lymphs Abs: 1.3 10*3/uL (ref 0.7–4.0)
MCHC: 33.1 g/dL (ref 30.0–36.0)
MCV: 100.4 fl — ABNORMAL HIGH (ref 78.0–100.0)
Monocytes Absolute: 0.5 10*3/uL (ref 0.1–1.0)
Monocytes Relative: 6.6 % (ref 3.0–12.0)
Neutro Abs: 5.1 10*3/uL (ref 1.4–7.7)
Neutrophils Relative %: 71.7 % (ref 43.0–77.0)
Platelets: 198 10*3/uL (ref 150.0–400.0)
RBC: 3.82 Mil/uL — ABNORMAL LOW (ref 3.87–5.11)
RDW: 14.2 % (ref 11.5–15.5)
WBC: 7.1 10*3/uL (ref 4.0–10.5)

## 2020-08-12 LAB — HEPATIC FUNCTION PANEL
ALT: 25 U/L (ref 0–35)
AST: 21 U/L (ref 0–37)
Albumin: 4.1 g/dL (ref 3.5–5.2)
Alkaline Phosphatase: 87 U/L (ref 39–117)
Bilirubin, Direct: 0.1 mg/dL (ref 0.0–0.3)
Total Bilirubin: 0.5 mg/dL (ref 0.2–1.2)
Total Protein: 6.3 g/dL (ref 6.0–8.3)

## 2020-08-12 LAB — LIPID PANEL
Cholesterol: 175 mg/dL (ref 0–200)
HDL: 56.4 mg/dL (ref >39.00)
LDL Cholesterol: 84 mg/dL (ref 0–99)
NonHDL: 118.98
Total CHOL/HDL Ratio: 3
Triglycerides: 173 mg/dL — ABNORMAL HIGH (ref 0.0–149.0)
VLDL: 34.6 mg/dL (ref 0.0–40.0)

## 2020-08-12 LAB — HEMOGLOBIN A1C: Hgb A1c MFr Bld: 5.8 % (ref 4.6–6.5)

## 2020-08-12 LAB — VITAMIN D 25 HYDROXY (VIT D DEFICIENCY, FRACTURES): VITD: 57.71 ng/mL (ref 30.00–100.00)

## 2020-08-12 LAB — T3, FREE: T3, Free: 2.7 pg/mL (ref 2.3–4.2)

## 2020-08-12 LAB — TSH: TSH: 6.9 u[IU]/mL — ABNORMAL HIGH (ref 0.35–4.50)

## 2020-08-13 ENCOUNTER — Other Ambulatory Visit
Admission: RE | Admit: 2020-08-13 | Discharge: 2020-08-13 | Disposition: A | Discharge status: Home / Self Care | Payer: Medicare HMO | Source: Ambulatory Visit | Attending: Physician Assistant | Admitting: Physician Assistant

## 2020-08-13 DIAGNOSIS — Z01812 Encounter for preprocedural laboratory examination: Secondary | ICD-10-CM | POA: Insufficient documentation

## 2020-08-13 DIAGNOSIS — Z20822 Contact with and (suspected) exposure to covid-19: Secondary | ICD-10-CM | POA: Diagnosis not present

## 2020-08-13 LAB — SARS CORONAVIRUS 2 (TAT 6-24 HRS): SARS Coronavirus 2: NEGATIVE

## 2020-08-14 ENCOUNTER — Other Ambulatory Visit: Payer: Self-pay

## 2020-08-14 ENCOUNTER — Ambulatory Visit (INDEPENDENT_AMBULATORY_CARE_PROVIDER_SITE_OTHER): Payer: Medicare HMO

## 2020-08-14 ENCOUNTER — Ambulatory Visit: Payer: Medicare HMO | Attending: Physician Assistant

## 2020-08-14 VITALS — BP 132/74 | HR 54 | Temp 97.5°F | Wt 165.0 lb

## 2020-08-14 DIAGNOSIS — I480 Paroxysmal atrial fibrillation: Secondary | ICD-10-CM

## 2020-08-14 DIAGNOSIS — Z79899 Other long term (current) drug therapy: Secondary | ICD-10-CM | POA: Diagnosis not present

## 2020-08-14 DIAGNOSIS — Z0000 Encounter for general adult medical examination without abnormal findings: Secondary | ICD-10-CM | POA: Diagnosis not present

## 2020-08-14 DIAGNOSIS — R059 Cough, unspecified: Secondary | ICD-10-CM | POA: Diagnosis not present

## 2020-08-14 DIAGNOSIS — Z5181 Encounter for therapeutic drug level monitoring: Secondary | ICD-10-CM | POA: Diagnosis not present

## 2020-08-14 LAB — PULMONARY FUNCTION TEST ARMC ONLY
DL/VA % pred: 70 %
DL/VA: 2.83 ml/min/mmHg/L
DLCO unc % pred: 63 %
DLCO unc: 13.56 ml/min/mmHg
FEF 25-75 Pre: 1.92 L/sec
FEF2575-%Pred-Pre: 102 %
FEV1-%Pred-Pre: 103 %
FEV1-Pre: 2.54 L
FEV1FVC-%Pred-Pre: 95 %
FEV6-%Pred-Pre: 114 %
FEV6-Pre: 3.56 L
FEV6FVC-%Pred-Pre: 104 %
FVC-%Pred-Pre: 109 %
Pre FEV1/FVC ratio: 71 %
Pre FEV6/FVC Ratio: 100 %
RV % pred: 107 %
RV: 2.64 L
TLC % pred: 106 %
TLC: 5.95 L

## 2020-08-14 NOTE — Patient Instructions (Signed)
[name] , Thank you for taking time to come for your Medicare Wellness Visit. I appreciate your ongoing commitment to your health goals. Please review the following plan we discussed and let me know if I can assist you in the future.   Screening recommendations/referrals: Colonoscopy: Up to date, completed 07/11/2017, due 06/2027 Mammogram: Up to date, completed 09/26/2019, due 08/2020 Bone Density: due, will discuss with provider Recommended yearly ophthalmology/optometry visit for glaucoma screening and checkup Recommended yearly dental visit for hygiene and checkup  Vaccinations: Influenza vaccine: Up to date, completed 07/29/2020, due 07/2021 Pneumococcal vaccine: Completed series Tdap vaccine: Up to date, completed 02/27/2019, 02/2029 Shingles vaccine: Completed series   Covid-19:Completed series  Advanced directives: Please bring a copy of your POA (Power of Attorney) and/or Living Will to your next appointment.   Conditions/risks identified: hypertension, hyperlipidemia  Next appointment: Follow up in one year for your annual wellness visit    Preventive Care 75 Years and Older, Female Preventive care refers to lifestyle choices and visits with your health care provider that can promote health and wellness. What does preventive care include?  A yearly physical exam. This is also called an annual well check.  Dental exams once or twice a year.  Routine eye exams. Ask your health care provider how often you should have your eyes checked.  Personal lifestyle choices, including:  Daily care of your teeth and gums.  Regular physical activity.  Eating a healthy diet.  Avoiding tobacco and drug use.  Limiting alcohol use.  Practicing safe sex.  Taking low-dose aspirin every day.  Taking vitamin and mineral supplements as recommended by your health care provider. What happens during an annual well check? The services and screenings done by your health care provider  during your annual well check will depend on your age, overall health, lifestyle risk factors, and family history of disease. Counseling  Your health care provider may ask you questions about your:  Alcohol use.  Tobacco use.  Drug use.  Emotional well-being.  Home and relationship well-being.  Sexual activity.  Eating habits.  History of falls.  Memory and ability to understand (cognition).  Work and work Statistician.  Reproductive health. Screening  You may have the following tests or measurements:  Height, weight, and BMI.  Blood pressure.  Lipid and cholesterol levels. These may be checked every 5 years, or more frequently if you are over 65 years old.  Skin check.  Lung cancer screening. You may have this screening every year starting at age 10 if you have a 30-pack-year history of smoking and currently smoke or have quit within the past 15 years.  Fecal occult blood test (FOBT) of the stool. You may have this test every year starting at age 31.  Flexible sigmoidoscopy or colonoscopy. You may have a sigmoidoscopy every 5 years or a colonoscopy every 10 years starting at age 65.  Hepatitis C blood test.  Hepatitis B blood test.  Sexually transmitted disease (STD) testing.  Diabetes screening. This is done by checking your blood sugar (glucose) after you have not eaten for a while (fasting). You may have this done every 1-3 years.  Bone density scan. This is done to screen for osteoporosis. You may have this done starting at age 42.  Mammogram. This may be done every 1-2 years. Talk to your health care provider about how often you should have regular mammograms. Talk with your health care provider about your test results, treatment options, and if necessary, the  need for more tests. Vaccines  Your health care provider may recommend certain vaccines, such as:  Influenza vaccine. This is recommended every year.  Tetanus, diphtheria, and acellular pertussis  (Tdap, Td) vaccine. You may need a Td booster every 10 years.  Zoster vaccine. You may need this after age 51.  Pneumococcal 13-valent conjugate (PCV13) vaccine. One dose is recommended after age 58.  Pneumococcal polysaccharide (PPSV23) vaccine. One dose is recommended after age 38. Talk to your health care provider about which screenings and vaccines you need and how often you need them. This information is not intended to replace advice given to you by your health care provider. Make sure you discuss any questions you have with your health care provider. Document Released: 09/11/2015 Document Revised: 05/04/2016 Document Reviewed: 06/16/2015 Elsevier Interactive Patient Education  2017 [place] Prevention in the Home Falls can cause injuries. They can happen to people of all ages. There are many things you can do to make your home safe and to help prevent falls. What can I do on the outside of my home?  Regularly fix the edges of walkways and driveways and fix any cracks.  Remove anything that might make you trip as you walk through a door, such as a raised step or threshold.  Trim any bushes or trees on the path to your home.  Use bright outdoor lighting.  Clear any walking paths of anything that might make someone trip, such as rocks or tools.  Regularly check to see if handrails are loose or broken. Make sure that both sides of any steps have handrails.  Any raised decks and porches should have guardrails on the edges.  Have any leaves, snow, or ice cleared regularly.  Use sand or salt on walking paths during winter.  Clean up any spills in your garage right away. This includes oil or grease spills. What can I do in the bathroom?  Use night lights.  Install grab bars by the toilet and in the tub and shower. Do not use towel bars as grab bars.  Use non-skid mats or decals in the tub or shower.  If you need to sit down in the shower, use a plastic, non-slip  stool.  Keep the floor dry. Clean up any water that spills on the floor as soon as it happens.  Remove soap buildup in the tub or shower regularly.  Attach bath mats securely with double-sided non-slip rug tape.  Do not have throw rugs and other things on the floor that can make you trip. What can I do in the bedroom?  Use night lights.  Make sure that you have a light by your bed that is easy to reach.  Do not use any sheets or blankets that are too big for your bed. They should not hang down onto the floor.  Have a firm chair that has side arms. You can use this for support while you get dressed.  Do not have throw rugs and other things on the floor that can make you trip. What can I do in the kitchen?  Clean up any spills right away.  Avoid walking on wet floors.  Keep items that you use a lot in easy-to-reach places.  If you need to reach something above you, use a strong step stool that has a grab bar.  Keep electrical cords out of the way.  Do not use floor polish or wax that makes floors slippery. If you must use wax,  use non-skid floor wax.  Do not have throw rugs and other things on the floor that can make you trip. What can I do with my stairs?  Do not leave any items on the stairs.  Make sure that there are handrails on both sides of the stairs and use them. Fix handrails that are broken or loose. Make sure that handrails are as long as the stairways.  Check any carpeting to make sure that it is firmly attached to the stairs. Fix any carpet that is loose or worn.  Avoid having throw rugs at the top or bottom of the stairs. If you do have throw rugs, attach them to the floor with carpet tape.  Make sure that you have a light switch at the top of the stairs and the bottom of the stairs. If you do not have them, ask someone to add them for you. What else can I do to help prevent falls?  Wear shoes that:  Do not have high heels.  Have rubber bottoms.  Are  comfortable and fit you well.  Are closed at the toe. Do not wear sandals.  If you use a stepladder:  Make sure that it is fully opened. Do not climb a closed stepladder.  Make sure that both sides of the stepladder are locked into place.  Ask someone to hold it for you, if possible.  Clearly mark and make sure that you can see:  Any grab bars or handrails.  First and last steps.  Where the edge of each step is.  Use tools that help you move around (mobility aids) if they are needed. These include:  Canes.  Walkers.  Scooters.  Crutches.  Turn on the lights when you go into a dark area. Replace any light bulbs as soon as they burn out.  Set up your furniture so you have a clear path. Avoid moving your furniture around.  If any of your floors are uneven, fix them.  If there are any pets around you, be aware of where they are.  Review your medicines with your doctor. Some medicines can make you feel dizzy. This can increase your chance of falling. Ask your doctor what other things that you can do to help prevent falls. This information is not intended to replace advice given to you by your health care provider. Make sure you discuss any questions you have with your health care provider. Document Released: 06/11/2009 Document Revised: 01/21/2016 Document Reviewed: 09/19/2014 Elsevier Interactive Patient Education  2017 [employer].

## 2020-08-14 NOTE — Progress Notes (Signed)
PCP notes:  Health Maintenance: Dexa- due   Abnormal Screenings: none   Patient concerns: none   Nurse concerns: none   Next PCP appt.: 08/17/2020 @ 9:20 am

## 2020-08-14 NOTE — Progress Notes (Signed)
Subjective:   [name] is a 74 y.o. female who presents for Medicare Annual (Subsequent) preventive examination.  Review of Systems: N/A     I connected with the patient today by telephone and verified that I am speaking with the correct person using two identifiers. Location patient: home Location nurse: work Persons participating in the telephone visit: patient, nurse.   I discussed the limitations, risks, security and privacy concerns of performing an evaluation and management service by telephone and the availability of in person appointments. I also discussed with the patient that there may be a patient responsible charge related to this service. The patient expressed understanding and verbally consented to this telephonic visit.        Cardiac Risk Factors include: advanced age (>11mn, >>18women);hypertension;Other (see comment), Risk factor comments: hyperlipidemia     Objective:    Today's Vitals   08/14/20 0750  BP: 132/74  Pulse: (!) 54  Temp: (!) 97.5 F (36.4 C)  Weight: 165 lb (74.8 kg)   Body mass index is 25.46 kg/m.  Advanced Directives 08/14/2020 05/27/2019 05/18/2018 07/11/2017 05/16/2017 03/04/2016 10/03/2014  Does Patient Have a Medical Advance Directive? _0  Yes Yes  Type of AParamedicof AKlem[place]Living will [place]Living will [place] will Out of facility DNR (pink MOST or yellow form) HM[place]villeLiving will HPinecrestLiving will [place] Does patient want to make changes to medical advance directive? - - - - - No - Patient declined No - Patient declined  Copy of HCoalportin Chart? No - copy requested No - copy requested No - copy requested - No - copy requested No - copy requested No - copy requested    Current Medications (verified) Outpatient Encounter Medications as of 08/14/2020   Medication Sig   amiodarone (PACERONE) 200 MG tablet Take 1 tablet (200 mg total) by mouth daily. Please schedule office visit for further refills. Thank you!   b complex vitamins tablet Take 1 tablet daily by mouth.   Calcium Citrate-Vitamin D (CALCIUM + D PO) Take 1 capsule daily by mouth.    carvedilol (COREG) 3.125 MG tablet TAKE 1 TABLET BY MOUTH TWICE A DAY   donepezil (ARICEPT) 5 MG tablet TAKE 1 TABLET BY MOUTH ONCE DAILY AT BEDTIME   fluticasone (FLONASE) 50 MCG/ACT nasal spray Place 2 sprays into both nostrils daily.   IRON, FERROUS SULFATE, PO Take 1 tablet by mouth daily.   levothyroxine (SYNTHROID) 50 MCG tablet TAKE 1 TAB BY MOUTH ONCE DAILY. TAKE ON AN EMPTY STOMACH WITH A GLASS OF WATER ATLEAST 30-60 MINUTES BEFORE BREAKFAST   losartan (COZAAR) 25 MG tablet TAKE 1 TABLET BY MOUTH ONCE A DAY   metroNIDAZOLE (METROCREAM) 0.75 % cream Apply topically 2 (two) times daily.   Omega-3 Fatty Acids (FISH OIL PO) Take 1 capsule by mouth daily.   permethrin (ELIMITE) 5 % cream APPLY PEA SIZED AMOUNT TO FACE ONCE A DAY   traZODone (DESYREL) 50 MG tablet TAKE 1/2 TO 1 TABLET BY MOUTH AT BEDTIMEAS NEEDED FOR SLEEP   warfarin (COUMADIN) 5 MG tablet TAKE 1/2 TO 1 TABLET BY MOUTH DAILY AS DIRECTED BY COUMADIN CLINIC   No facility-administered encounter medications on file as of 08/14/2020.    Allergies (verified) Patient has no known allergies.   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 ([place]) 09/2014   a) [place] Echo: EF ~10% Severe LV dilation & global LV Systolic dysfxn, restrictive filling pattern - Gr 3 DD, mod RV dysfxn, severe LA dilation - 6.4 cm, mod RA dil, mod pl effusion, mod MR, mild TR; b) TEE 10/07/14: EF ~10%, Severe LV dilation & dysfxn Mod RV dysfxn, LAA oversewn, Mod RA & TR   Diverticulosis    H/O: rheumatic fever    Childhood   HYPERLIPIDEMIA 04/05/2010   Qualifier: Diagnosis of  By: [name] MD, Spencer     Hypothyroid    On replacement therapy; normal TSH 09/2014   Migraine headache    Osteopenia    Paroxysmal atrial fibrillation ([place]) 2005; Recurrent 09/2014   a) 2005: s/p Cox Maze & LAA ligation; b) 09/2014: TEE-cardioversion   Rheumatic mitral and aortic valve insufficiency 2005   a) s/p MV Ring repair; with Cox-Maze for Afib; b) Echo 2013: EF 60-65%,no Regional WMA, Gr 2 DD, no Sig MR ;; c) TEE 10/07/2014: Severlely thickened and calcified MV leaflets. Posterior MV leaflet is fixed and immobile. MAC. reduced excursion of the anterior MV leaflet. Mean MV gradient was 44m Hg and MVA calculated at cm2.     Urinary incontinence    Past Surgical History:  Procedure Laterality Date   ABDOMINAL HYSTERECTOMY     CARDIAC CATHETERIZATION  Jan 2005   Pre-op R&LHC -- Nonobstructive CAD; normal PA pressures   CARDIOVERSION  06/04/2012   Procedure: CARDIOVERSION;  Surgeon: JCarlena Bjornstad MD;  [place]  Service: Cardiovascular;  Laterality: N/A;   CARDIOVERSION N/A 10/07/2014   Procedure: CARDIOVERSION;  Surgeon: TSueanne Margarita MD;  [place]  Service: Cardiovascular;  Laterality: N/A;   COLONOSCOPY WITH PROPOFOL N/A 07/11/2017   Procedure: COLONOSCOPY WITH PROPOFOL;  Surgeon: Tol[place], TBenay Pike MD;  Location: [facility] ENDOSCOPY;  Service: Gastroenterology;  Laterality: N/A;    LUMBAR DISC SURGERY     x 2 distantly   MITRAL VALVE REPAIR  2005   with [facility] M[facility] for [employer]  NM MYOVIEW LTD  4/7/'16   EF 37% with septal hypokinesis and diffuse hypokinesis. No ischemia or infarction. 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   RIGHT HEART CATHETERIZATION N/A 10/03/2014   Procedure: RIGHT HEART CATH;  Surgeon: DJolaine Artist MD;  [facility];  Service: Cardiovascular;  RAP 550mg, RVP 35/2/9 mmHg, PAP 45/12 mmHg, PCWP 16 mmHg; CO/I by Fick: 3.9/2.3; Ao/PA/SVC SaO2%: 97%/63%/65%.   TEE WITHOUT CARDIOVERSION N/A 10/07/2014   Procedure: TRANSESOPHAGEAL ECHOCARDIOGRAM (TEE);  Surgeon: TrSueanne MargaritaMD;  [facility];  Service: Cardiovascular;  Laterality: N/A;   THYROIDECTOMY, PARTIAL     partial-no cancer; now on thyroid replacement   TRANSTHORACIC ECHOCARDIOGRAM  2013; 2/'16 & 5/'16   a) 2013: EF 60-65%, mild MR, Gr 2  DD; b) 2/'16 @ [facility]: EF ~10% - severel global LV dysfunction (systolic & diastolic) - dilated LV & restrictive filling pattern - Gr 3 DD, mod reduced RV function, severely dilated LA - 6.4 cm, mod dilated RA, mod pl effusion, mod MR, mild TR; c) 5/10/'16:  EF 25-30%, mod LV dilation, no RWMA,Gr 3 DD w/ high LAP, MV sewing ring intact w/ Mod MR, Severe LA dilation   TRANSTHORACIC ECHOCARDIOGRAM  January 2017:   EF improved to 45-50%. Mild diffuse HK. Mild MR. Moderate LA dilation. Normal PA pressures.   Family History  Problem Relation Age of Onset   Diabetes Mother    Coronary artery disease Mother    Kidney disease Mother    Breast cancer Neg Hx    Social History   Socioeconomic History   Marital status: Married    Spouse name: Not on file   Number of children: 2   Years of education: Not on file   Highest education level: Not on file  Occupational History   Occupation: Retired  Tobacco Use   Smoking status: Never Smoker   Smokeless tobacco: Never  Used  Scientific laboratory technician Use: Never used  Substance and Sexual Activity   Alcohol use: Not Currently   Drug use: No   Sexual activity: Yes  Other Topics Concern   Not on file  Social History Narrative   Not on file   Social Determinants of Health   Financial Resource Strain: Low Risk    Difficulty of Paying Living Expenses: Not hard at all  Food Insecurity: No Food Insecurity   Worried About Charity fundraiser in the Last Year: Never true   [place] in the Last Year: Never true  Transportation Needs: No Transportation Needs   Lack of Transportation (Medical): No   Lack of Transportation (Non-Medical): No  Physical Activity: Inactive   Days of Exercise per Week: 0 days   Minutes of Exercise per Session: 0 min  Stress: No Stress Concern Present   Feeling of Stress : Not at all  Social Connections: Not on file    Tobacco Counseling Counseling given: Not Answered   Clinical Intake:  Pre-visit preparation completed: Yes  Pain : 0-10 Pain Type: Chronic pain Pain Location: Knee Pain Descriptors / Indicators: Aching Pain Onset: More than a month ago Pain Frequency: Intermittent     Nutritional Risks: None Diabetes: No  How often do you need to have someone help you when you read instructions, pamphlets, or other written materials from your doctor or pharmacy?: 1 - Never What is the last grade level you completed in school?: 1 year of college  Diabetic: No Nutrition Risk Assessment:   Has the patient had any N/V/D within the last 2 months?  No  Does the patient have any non-healing wounds?  No  Has the patient had any unintentional weight loss or weight gain?  No   Diabetes:  Is the patient diabetic?  No  If diabetic, was a CBG obtained today?  No  telephone visit  Did the patient bring in their glucometer from home?  No  telephone visit  How often do you monitor your CBG's? N/A.   Financial Strains and Diabetes Management:  Are you  having any financial strains with the device, your supplies or your medication? N/A.  Does the patient want to be seen by Chronic Care Management for management of their diabetes?  N/A Would the patient like to be  referred to a Nutritionist or for Diabetic Management?  N/A  Interpreter Needed?: No  Information entered by :: CJohnson, LPN   Activities of Daily Living In your present state of health, do you have any difficulty performing the following activities: 08/14/2020  Hearing? Y  Comment wears hearing aids  Vision? N  Difficulty concentrating or making decisions? N  Walking or climbing stairs? Y  Comment has knee pain  Dressing or bathing? N  Doing errands, shopping? N  Preparing Food and eating ? N  Using the Toilet? N  In the past six months, have you accidently leaked urine? Y  Comment wears pads daily  Do you have problems with loss of bowel control? N  Managing your Medications? N  Managing your Finances? N  Housekeeping or managing your Housekeeping? N  Some recent data might be hidden    Patient Care Team: Owens Loffler, MD as PCP - General (Family Medicine) End, Harrell Gave, MD as PCP - Cardiology (Cardiology)  Indicate any recent Medical Services you may have received from other than [facility] providers in the past year (date may be approximate).     Assessment:   This is a routine wellness examination for Shaquisha.  Hearing/Vision screen  Hearing Screening   _0  _1  _2  _3  _4  _5  _6  _7  _8   Right ear:           Left ear:           Vision Screening Comments: Patient gets annual eye exams   Dietary issues and exercise activities discussed: Current Exercise Habits: The patient does not participate in regular exercise at present, Exercise limited by: None identified  Goals     Increase water intake     Starting 05/18/2018, I will continue to drink at least 6-8 glasses of water daily.      Patient Stated     05/27/2019, Patient  wants to improve her strength and be able to walk better.     Patient Stated     08/14/2020, I will maintain and continue medications as prescribed.       Depression Screen PHQ 2/9 Scores 08/14/2020 05/27/2019 05/18/2018 05/16/2017 03/04/2016  PHQ - 2 Score 0 0 0 0 0  PHQ- 9 Score 0 0 0 2 -    Fall Risk Fall Risk  08/14/2020 05/27/2019 05/18/2018 05/16/2017 03/04/2016  Falls in the past year? 1 0 No No Yes  Comment has knee pain and has went down a few times due to this - - - -  Number falls in past yr: 1 0 - - 1  Injury with Fall? 0 - - - Yes  Risk for fall due to : Medication side effect Medication side effect - - -  Follow up Falls evaluation completed;Falls prevention discussed Falls evaluation completed;Falls prevention discussed - - Falls evaluation completed    FALL RISK PREVENTION PERTAINING TO THE HOME:  Any stairs in or around the home? Yes  If so, are there any without handrails? No  Home free of loose throw rugs in walkways, pet beds, electrical cords, etc? Yes  Adequate lighting in your home to reduce risk of falls? Yes   ASSISTIVE DEVICES UTILIZED TO PREVENT FALLS:  Life alert? No  Use of a cane, walker or w/c? No  Grab bars in the bathroom? No  Shower chair or bench in shower? No  Elevated toilet seat or a handicapped toilet? No   TIMED UP AND GO:  Was the test performed? N/A, telephone visit .  Cognitive Function: MMSE - Mini Mental State Exam 08/14/2020 05/27/2019 05/20/2018 05/18/2018 05/16/2017  Orientation to time 5 5 - 5 5  Orientation to Place 5 5 - 5 5  Registration 3 3 - 3 3  Attention/ Calculation 5 5 - 0 0  Recall _0 Recall-comments - - - - -  Language- name 2 objects - - - 0 0  Language- repeat 1 1 - 1 1  Language- follow 3 step command - - - 3 3  Language- read & follow direction - - - 0 0  Write a sentence - - - 0 0  Copy design - - - 0 0  Total score - - - 20 20  Mini Cog  Mini-Cog screen was completed. Maximum score is 22. A value  of 0 denotes this part of the MMSE was not completed or the patient failed this part of the Mini-Cog screening.       Immunizations Immunization History  Administered Date(s) Administered   Fluad Quad(high Dose 65+) 07/29/2020   Influenza Split 05/11/2011   Influenza Whole 06/30/2010   Influenza, High Dose Seasonal PF 06/27/2015, 05/01/2018, 04/28/2019   Influenza,inj,Quad PF,6+ Mos 07/29/2013, 05/27/2016, 05/16/2017   PFIZER SARS-COV-2 Vaccination 09/02/2019, 09/23/2019, 06/02/2020   Pneumococcal Conjugate-13 08/26/2015   Pneumococcal Polysaccharide-23 04/05/2010   Td 02/27/2019   Zoster Recombinat (Shingrix) 03/29/2018, 08/25/2018    TDAP status: Up to date  Flu Vaccine status: Up to date  Pneumococcal vaccine status: Up to date  Covid-19 vaccine status: Completed vaccines  Qualifies for Shingles Vaccine? Yes   Zostavax completed No   Shingrix Completed?: Yes  Screening Tests Health Maintenance  Topic Date Due   COLONOSCOPY  07/12/2027   TETANUS/TDAP  02/26/2029   INFLUENZA VACCINE  Completed   DEXA SCAN  Completed   COVID-19 Vaccine  Completed   Hepatitis C Screening  Completed   PNA vac Low Risk Adult  Completed    Health Maintenance  There are no preventive care reminders to display for this patient.  Colorectal cancer screening: Type of screening: Colonoscopy. Completed 07/11/2017. Repeat every 10 years  Mammogram status: Completed 09/26/2019. Repeat every year  Bone Density status: due, will discuss with provider at appointment  Lung Cancer Screening: (Low Dose CT Chest recommended if Age 59-80 years, 30 pack-year currently smoking OR have quit w/in 15years.) does not qualify.   Additional Screening:  Hepatitis C Screening: does qualify; Completed 03/04/2016  Vision Screening: Recommended annual ophthalmology exams for early detection of glaucoma and other disorders of the eye. Is the patient up to date with their annual eye exam?  Yes   Who is the provider or what is the name of the office in which the patient attends annual eye exams? [facility]  If pt is not established with a provider, would they like to be referred to a provider to establish care? No .   Dental Screening: Recommended annual dental exams for proper oral hygiene  Community Resource Referral / Chronic Care Management: CRR required this visit?  No   CCM required this visit?  No      Plan:     I have personally reviewed and noted the following in the patients chart:    Medical and social history  Use of alcohol, tobacco or illicit drugs   Current medications and supplements  Functional ability and status  Nutritional status  Physical activity  Advanced directives  List of other physicians  Hospitalizations, surgeries, and  ER visits in previous 12 months  Vitals  Screenings to include cognitive, depression, and falls  Referrals and appointments  In addition, I have reviewed and discussed with patient certain preventive protocols, quality metrics, and best practice recommendations. A written personalized care plan for preventive services as well as general preventive health recommendations were provided to patient.   Due to this being a telephonic visit, the after visit summary with patients personalized plan was offered to patient via office or my-chart. Patient preferred to pick up at office at next visit or via mychart.   Andrez Grime, LPN   [phone]

## 2020-08-17 ENCOUNTER — Encounter: Payer: Medicare HMO | Admitting: Family Medicine

## 2020-08-17 ENCOUNTER — Other Ambulatory Visit: Payer: Self-pay | Admitting: *Deleted

## 2020-08-17 ENCOUNTER — Telehealth: Payer: Self-pay | Admitting: *Deleted

## 2020-08-17 DIAGNOSIS — I480 Paroxysmal atrial fibrillation: Secondary | ICD-10-CM

## 2020-08-17 NOTE — Telephone Encounter (Signed)
-----   Message from Arvil Chaco, PA-C sent at 08/15/2020  6:23 PM EST ----- Pulmonary function test without evidence of obstructive or restrictive disease of lungs, which is good news. Report mentions she can follow-up with pulmonology with ongoing shortness of breath.  Recommend updating an echo if she is still experiencing shortness of breath with exertion, or if any worsening in her symptoms.

## 2020-08-17 NOTE — Telephone Encounter (Signed)
Per result note, pt has been notified of results:  Reviewed results and recommendations with patient. She was agreeable with plan and will have scheduling reach out for echocardiogram. She did request for it to be done in January 2022. She was appreciative for call with no further questions at this time.

## 2020-08-17 NOTE — Telephone Encounter (Signed)
Attempted to call pt with results and recc below. No answer. LMOM TCB.

## 2020-08-18 ENCOUNTER — Telehealth: Payer: Self-pay

## 2020-08-18 ENCOUNTER — Other Ambulatory Visit: Payer: Self-pay

## 2020-08-18 ENCOUNTER — Ambulatory Visit (INDEPENDENT_AMBULATORY_CARE_PROVIDER_SITE_OTHER): Payer: Medicare HMO | Admitting: Family Medicine

## 2020-08-18 ENCOUNTER — Encounter: Payer: Self-pay | Admitting: Family Medicine

## 2020-08-18 VITALS — BP 120/56 | HR 50 | Temp 97.5°F | Ht 67.68 in | Wt 162.5 lb

## 2020-08-18 DIAGNOSIS — Z0000 Encounter for general adult medical examination without abnormal findings: Secondary | ICD-10-CM

## 2020-08-18 DIAGNOSIS — M170 Bilateral primary osteoarthritis of knee: Secondary | ICD-10-CM | POA: Diagnosis not present

## 2020-08-18 DIAGNOSIS — M810 Age-related osteoporosis without current pathological fracture: Secondary | ICD-10-CM | POA: Diagnosis not present

## 2020-08-18 MED ORDER — TRIAMCINOLONE ACETONIDE 40 MG/ML IJ SUSP
40.0000 mg | Freq: Once | INTRAMUSCULAR | Status: AC
  Administered 2020-08-18: 40 mg via INTRA_ARTICULAR

## 2020-08-18 NOTE — Telephone Encounter (Signed)
-----   Message from Arvil Chaco, PA-C sent at 08/17/2020  4:13 PM EST ----- Regarding: RE: TSH / Liver panel This works - Either way works for me :)  LFTs look fine. We can let her know, if she does not already have those results.  Thank you!  ----- Message ----- From: Solmon Ice, RN Sent: 08/17/2020   9:30 AM EST To: Arvil Chaco, PA-C Subject: TSH / Liver panel                              [name],   I had ordered these labs from a pharmacy reminder that pt needs TSH and LFTs monitored while on Amidoarone and she had not had done in a while. These resulted under Dr. Lorelei Pont since she saw him recently. Wanted to make you aware that results are now in her chart from 12/15. If I am supposed to route these to you please let me know, but I didn't know any other way to notify you.   Thanks!  Megan  ----- Message ----- From: Owens Loffler, MD Sent: 07/16/2020   1:15 PM EST To: Solmon Ice, RN Subject: RE: Add on Labwork                             No problem. She has hypothyroidism, so I would normally check them.  Have a nice weekend! ----- Message ----- From: Solmon Ice, RN Sent: 07/16/2020  12:39 PM EST To: Owens Loffler, MD, Solmon Ice, RN Subject: Add on Labwork                                 We saw this pt in the heart care clinic today and realized she needs TSH and Liver panel for Amiodarone management. Pt states she has upcoming lab appointment with your office on 08/12/20. Can these labs be added on please?   Thank you,  Darlyne Russian, RN

## 2020-08-18 NOTE — Progress Notes (Signed)
Deborah Mcbain T. Deborah Ezzell, MD, [place] at [facility] [place], [address]  Phone: [phone]  FAX: [phone]  [name] - 74 y.o. female  MRN [id number]  Date of Birth: 1946/07/09  Date: 08/18/2020  PCP: Owens Loffler, MD  Referral: Owens Loffler, MD  Chief Complaint  Patient presents with  . Annual Exam    Part 2    This visit occurred during the SARS-CoV-2 public health emergency.  Safety protocols were in place, including screening questions prior to the visit, additional usage of staff PPE, and extensive cleaning of exam room while observing appropriate contact time as indicated for disinfecting solutions.   Patient Care Team: Owens Loffler, MD as PCP - General (Family Medicine) End, Harrell Gave, MD as PCP - Cardiology (Cardiology) Subjective:   [name] is a 74 y.o. pleasant patient who presents with the following:  Health Maintenance Summary Reviewed and updated, unless pt declines services.  Tobacco History Reviewed. Non-smoker Alcohol: No concerns, no excessive use Exercise Habits: Rare activity  STD concerns: none Drug Use: None Lumps or breast concerns: no  B knee injections -she does have some intermittent chronic knee pain.  She has responded well to intra-articular steroids in the past.  She does have also some chondrocalcinosis.  She is on Coumadin, limiting her ability to take NSAIDs.  Some issues with r back, ? This is been happening over the last few months.  It predominantly is in the right buttocks region No groin pain   Health Maintenance  Topic Date Due  . COLONOSCOPY  07/12/2027  . TETANUS/TDAP  02/26/2029  . INFLUENZA VACCINE  Completed  . DEXA SCAN  Completed  . COVID-19 Vaccine  Completed  . Hepatitis C Screening  Completed  . PNA vac Low Risk Adult  Completed    Immunization History  Administered Date(s) Administered  .  Fluad Quad(high Dose 65+) 07/29/2020  . Influenza Split 05/11/2011  . Influenza Whole 06/30/2010  . Influenza, High Dose Seasonal PF 06/27/2015, 05/01/2018, 04/28/2019  . Influenza,inj,Quad PF,6+ Mos 07/29/2013, 05/27/2016, 05/16/2017  . PFIZER SARS-COV-2 Vaccination 09/02/2019, 09/23/2019, 06/02/2020  . Pneumococcal Conjugate-13 08/26/2015  . Pneumococcal Polysaccharide-23 04/05/2010  . Td 02/27/2019  . Zoster Recombinat (Shingrix) 03/29/2018, 08/25/2018   Patient Active Problem List   Diagnosis Date Noted  . PAF (paroxysmal atrial fibrillation) ([place]) 03/04/2015    Priority: High  . On amiodarone therapy 11/14/2014    Priority: High  . Mild dementia ([place]) 10/24/2014    Priority: High  . Chronic combined systolic and diastolic CHF, NYHA class 1 ([place]) 09/29/2014    Priority: High  . 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 Current use of long term anticoagulation 11/14/2014    Priority: Medium  . Hyperlipidemia LDL goal <100 04/05/2010    Priority: Medium  . Hypothyroid 11/18/2008    Priority: Medium  . Generalized anxiety disorder 11/18/2008    Priority: Medium  . Major depressive disorder, recurrent episode, moderate with anxious distress ([place]) 11/18/2008    Priority: Medium  . Essential hypertension 07/12/2018  . Insomnia 12/05/2017  . Primary osteoarthritis of both knees 10/12/2016  . Fracture of left pelvis ([place]) 04/29/2015  . GERD 04/05/2010  . ASTHMA 11/18/2008  . Mixed incontinence urge and stress 11/18/2008  .  NEPHROLITHIASIS, HX OF 11/18/2008    Past Medical History:  Diagnosis Date  . Acne rosacea   . Anxiety   . Asthma   . Atrial fibrillation and flutter ([place]) 09/2014   Revereted from Afib RVR to 2:1 A Flutter -- TEE/ DCCV 2/9; on Amiodarone  . Chronic combined systolic and diastolic CHF, NYHA class 1 ([facility]) --  Systolic dysfunction [id number][S17.79] 09/2014   Exacerbation 09/2014 2/2 Afib/flutter with RVR - 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 Mod LA dilation, normal PA pressures   . CVA (cerebral infarction)    h/o superior cerebellar infarct  . Depression   . Digoxin toxicity 09/2014  . 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 Diverticulosis   . H/O: rheumatic fever    Childhood  . HYPERLIPIDEMIA 04/05/2010   Qualifier: Diagnosis of  By: Lorelei Pont MD, Frederico Hamman    . Hypothyroid    On replacement therapy; normal TSH 09/2014  . Migraine headache   . Osteopenia   . Paroxysmal atrial fibrillation ([place]) 2005; Recurrent 09/2014   a) 2005: s/p Cox Maze & LAA ligation; b) 09/2014: TEE-cardioversion  . Rheumatic mitral and aortic valve insufficiency 2005   a) s/p MV Ring repair; with Cox-Maze for Afib; b) Echo 2013: EF 60-65%,no Regional WMA, Gr 2 DD, no Sig MR ;; c) TEE 10/07/2014: Severlely thickened and calcified MV leaflets. Posterior MV leaflet is fixed and immobile. MAC. reduced excursion of the anterior MV leaflet. Mean MV gradient was 37m Hg and MVA calculated at cm2.    . Urinary incontinence     Past Surgical History:  Procedure Laterality Date  . ABDOMINAL HYSTERECTOMY    . CARDIAC CATHETERIZATION  Jan 2005   Pre-op R&LHC -- Nonobstructive CAD; normal PA pressures  . CARDIOVERSION  06/04/2012   Procedure: CARDIOVERSION;  Surgeon: JCarlena Bjornstad MD;  [facility];  Service: Cardiovascular;  Laterality: N/A;  . CARDIOVERSION N/A 10/07/2014   Procedure: CARDIOVERSION;  Surgeon: TSueanne Margarita MD;   [place]  Service: Cardiovascular;  Laterality: N/A;  . COLONOSCOPY WITH PROPOFOL N/A 07/11/2017   Procedure: COLONOSCOPY WITH PROPOFOL;  Surgeon: Tol[place], TBenay Pike MD;  Location: [facility] ENDOSCOPY;  Service: Gastroenterology;  Laterality: N/A;  . LUMBAR DISC SURGERY     x 2 distantly  . MITRAL VALVE REPAIR  2005   with [facility] Maze for [employer] . NM MYOVIEW LTD  4/7/'16   EF 37% with septal hypokinesis and diffuse hypokinesis. No ischemia or infarction. 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  .Marland KitchenRIGHT HEART CATHETERIZATION N/A 10/03/2014   Procedure: RIGHT HEART CATH;  Surgeon: DJolaine Artist MD;  [facility];  Service: Cardiovascular;  RAP 533mg, RVP 35/2/9 mmHg, PAP 45/12 mmHg, PCWP 16 mmHg; CO/I by Fick: 3.9/2.3; Ao/PA/SVC SaO2%: 97%/63%/65%.  . TEE WITHOUT CARDIOVERSION N/A 10/07/2014   Procedure: TRANSESOPHAGEAL ECHOCARDIOGRAM (TEE);  Surgeon: TrSueanne MargaritaMD;  [facility];  Service: Cardiovascular;  Laterality: N/A;  . THYROIDECTOMY, PARTIAL     partial-no cancer; now on  thyroid replacement  . TRANSTHORACIC ECHOCARDIOGRAM  2013; 2/'16 & 5/'16   a) 2013: EF 60-65%, mild MR, Gr 2 DD; b) 2/'16 @ [facility]: EF ~10% - severel global LV dysfunction (systolic & diastolic) - dilated LV & restrictive filling pattern - Gr 3 DD, mod reduced RV function, severely dilated LA - 6.4 cm, mod dilated RA, mod pl effusion, mod MR, mild TR; c) 5/10/'16:  EF 25-30%, mod LV dilation, no RWMA,Gr 3 DD w/ high LAP, MV sewing ring intact w/ Mod MR, Severe LA dilation  . TRANSTHORACIC ECHOCARDIOGRAM  January 2017:   EF improved to 45-50%. Mild diffuse HK. Mild MR. Moderate LA dilation. Normal PA pressures.    Family History  Problem Relation Age of Onset  . Diabetes Mother   . Coronary artery disease Mother   . Kidney disease Mother   . Breast cancer Neg Hx     Past Medical History, Surgical History, Social History, Family  History, Problem List, Medications, and Allergies have been reviewed and updated if relevant.  Review of Systems: Pertinent positives are listed above.  Otherwise, a full 14 point review of systems has been done in full and it is negative except where it is noted positive.  Objective:   BP (!) 120/56   Pulse (!) 50   Temp (!) 97.5 F (36.4 C) (Temporal)   Ht 5' 7.68" (1.719 m)   Wt 162 lb 8 oz (73.7 kg)   SpO2 98%   BMI 24.94 kg/m  Ideal Body Weight: Weight in (lb) to have BMI = 25: 162.5 No exam data present Depression screen [facility] 2/9 08/14/2020 05/27/2019 05/18/2018 05/16/2017 03/04/2016  Decreased Interest 0 0 0 0 0  Down, Depressed, Hopeless 0 0 0 0 0  PHQ - 2 Score 0 0 0 0 0  Altered sleeping 0 0 0 1 -  Tired, decreased energy 0 0 0 1 -  Change in appetite 0 0 0 0 -  Feeling bad or failure about yourself  0 0 0 0 -  Trouble concentrating 0 0 0 0 -  Moving slowly or fidgety/restless 0 0 0 0 -  Suicidal thoughts 0 0 0 0 -  PHQ-9 Score 0 0 0 2 -  Difficult doing work/chores Not difficult at all Not difficult at all Not difficult at all Not difficult at all -  Some recent data might be hidden     GEN: well developed, well nourished, no acute distress Eyes: conjunctiva and lids normal, PERRLA, EOMI ENT: TM clear, nares clear, oral exam WNL Neck: supple, no lymphadenopathy, no thyromegaly, no JVD Pulm: clear to auscultation and percussion, respiratory effort normal CV: regular rate and rhythm, S1-S2, no murmur, rub or gallop, no bruits Chest: no scars, masses, no lumps BREAST: breast exam declined GI: soft, non-tender; no hepatosplenomegaly, masses; active bowel sounds all quadrants GU: GU exam declined Lymph: no cervical, axillary or inguinal adenopathy MSK: She does have notable medial and lateral joint line tenderness and crepitus with motion.  SKIN: clear, good turgor, color WNL, no rashes, lesions, or ulcerations Neuro: normal mental status, normal strength, sensation,  and motion Psych: alert; oriented to person, place and time, normally interactive and not anxious or depressed in appearance.   All labs reviewed with patient. Lab Review:  CBC EXTENDED Latest Ref Rng & Units 08/12/2020 11/07/2018 05/18/2018  WBC 4.0 - 10.5 K/uL 7.1 4.1 3.4(L)  RBC 3.87 - 5.11 Mil/uL 3.82(L) 3.85(L) 3.93  HGB 12.0 - 15.0 g/dL 12.7 13.5 13.6  HCT 36.0 - 46.0 % 38.3 40.3 40.8  PLT 150.0 - 400.0 K/uL 198.0 180.0 196.0  NEUTROABS 1.4 - 7.7 K/uL 5.1 2.6 2.0  LYMPHSABS 0.7 - 4.0 K/uL 1.3 1.1 1.0    BMP Latest Ref Rng & Units 08/12/2020 03/13/2019 11/07/2018  Glucose 70 - 99 mg/dL 89 86 98  BUN 6 - 23 mg/dL 20 24(H) 15  Creatinine 0.40 - 1.20 mg/dL 0.91 0.86 0.98  BUN/Creat Ratio 12 - 28 - - -  Sodium 135 - 145 mEq/L 140 139 140  Potassium 3.5 - 5.1 mEq/L 4.7 5.1 4.7  Chloride 96 - 112 mEq/L 104 104 106  CO2 19 - 32 mEq/L _0 Calcium 8.4 - 10.5 mg/dL 9.0 9.3 9.2    Hepatic Function Latest Ref Rng & Units 08/12/2020 03/13/2019 11/07/2018  Total Protein 6.0 - 8.3 g/dL 6.3 6.5 6.8  Albumin 3.5 - 5.2 g/dL 4.1 4.5 4.4  AST 0 - 37 U/L _1 ALT 0 - 35 U/L _2 Alk Phosphatase 39 - 117 U/L 87 78 68  Total Bilirubin 0.2 - 1.2 mg/dL 0.5 0.7 0.7  Bilirubin, Direct 0.0 - 0.3 mg/dL 0.1 - 0.1    Lab Results  Component Value Date   CHOL 175 08/12/2020   Lab Results  Component Value Date   HDL 56.40 08/12/2020   Lab Results  Component Value Date   LDLCALC 84 08/12/2020   Lab Results  Component Value Date   TRIG 173.0 (H) 08/12/2020   Lab Results  Component Value Date   CHOLHDL 3 08/12/2020   No results for input(s): PSA in the last 72 hours. Lab Results  Component Value Date   HCVAB NEGATIVE 03/04/2016   Lab Results  Component Value Date   VD25OH 57.71 08/12/2020   VD25OH 38.50 05/18/2018   VD25OH 24.48 (L) 05/16/2017     Lab Results  Component Value Date   HGBA1C 5.8 08/12/2020   HGBA1C 5.6 03/13/2019   Lab Results  Component Value  Date   LDLCALC 84 08/12/2020   CREATININE 0.91 08/12/2020    Assessment and Plan:     ICD-10-CM   1. Healthcare maintenance  Z00.00   2. Primary osteoarthritis of knees, bilateral  M17.0 triamcinolone acetonide (KENALOG-40) injection 40 mg    triamcinolone acetonide (KENALOG-40) injection 40 mg  3. Osteoporosis without pathological fracture  M81.0 DG Bone Density   Globally, she is doing fairly well.  She is quite compliant with taking all of her medications.  Failure of other treatment modalities.  Reasonable to inject both her knees with some steroids today.  Aspiration/Injection Procedure Note KIERSTON PLASENCIA [birth year] Date of procedure: 08/18/2020  Procedure: Large Joint Joint Aspiration / Injection of the Right Knee Indications: Pain  Procedure Details Patient verbally consented to procedure. Risks, benefits, and alternatives explained. Sterilely prepped with Chloraprep. Ethyl cholride used for anesthesia. 9 cc Lidocaine 1% mixed with 1 mL Kenalog 40 mg injected using the anteromedial approach without difficulty. No complications with procedure and tolerated well. Patient had decreased pain post-injection.  Medication: 1 mL of Kenalog 40 mg  Aspiration/Injection Procedure Note DANINE HOR 1946-05-15 Date of procedure: 08/18/2020  Procedure: Large Joint Aspiration / Injection of the Left Knee Indications: Pain  Procedure Details Patient verbally consented to procedure. Risks, benefits, and alternatives explained. Sterilely prepped with Chloraprep. Ethyl cholride used for anesthesia. 9 cc Lidocaine 1% mixed with 1 mL Kenalog 40 mg injected using the anteromedial approach  without difficulty. No complications with procedure and tolerated well. Patient had decreased pain post-injection.  Medication: 1 mL of Kenalog 40 mg   Health Maintenance Exam: The patient's preventative maintenance and recommended screening tests for an annual wellness exam were reviewed in full  today. Brought up to date unless services declined.  Counselled on the importance of diet, exercise, and its role in overall health and mortality. The patient's FH and SH was reviewed, including their home life, tobacco status, and drug and alcohol status.  Follow-up in 1 year for physical exam or additional follow-up below.  Follow-up: No follow-ups on file. Or follow-up in 1 year if not noted.  Meds ordered this encounter  Medications  . triamcinolone acetonide (KENALOG-40) injection 40 mg  . triamcinolone acetonide (KENALOG-40) injection 40 mg   There are no discontinued medications. Orders Placed This Encounter  Procedures  . DG Bone Density    Signed,  Emlyn Maves T. Robbert Langlinais, MD   Allergies as of 08/18/2020   No Known Allergies     Medication List       Accurate as of August 18, 2020 11:59 PM. If you have any questions, ask your nurse or doctor.        amiodarone 200 MG tablet Commonly known as: PACERONE Take 1 tablet (200 mg total) by mouth daily. Please schedule office visit for further refills. Thank you!   b complex vitamins tablet Take 1 tablet daily by mouth.   CALCIUM + D PO Take 1 capsule daily by mouth.   carvedilol 3.125 MG tablet Commonly known as: COREG TAKE 1 TABLET BY MOUTH TWICE A DAY   donepezil 5 MG tablet Commonly known as: ARICEPT TAKE 1 TABLET BY MOUTH ONCE DAILY AT BEDTIME   FISH OIL PO Take 1 capsule by mouth daily.   fluticasone 50 MCG/ACT nasal spray Commonly known as: FLONASE Place 2 sprays into both nostrils daily.   IRON (FERROUS SULFATE) PO Take 1 tablet by mouth daily.   levothyroxine 50 MCG tablet Commonly known as: SYNTHROID TAKE 1 TAB BY MOUTH ONCE DAILY. TAKE ON AN EMPTY STOMACH WITH A GLASS OF WATER ATLEAST 30-60 MINUTES BEFORE BREAKFAST   losartan 25 MG tablet Commonly known as: COZAAR TAKE 1 TABLET BY MOUTH ONCE A DAY   metroNIDAZOLE 0.75 % cream Commonly known as: METROCREAM Apply topically 2 (two)  times daily.   permethrin 5 % cream Commonly known as: ELIMITE APPLY PEA SIZED AMOUNT TO FACE ONCE A DAY   traZODone 50 MG tablet Commonly known as: DESYREL TAKE 1/2 TO 1 TABLET BY MOUTH AT BEDTIMEAS NEEDED FOR SLEEP   warfarin 5 MG tablet Commonly known as: COUMADIN Take as directed by the anticoagulation clinic. If you are unsure how to take this medication, talk to your nurse or doctor. Original instructions: TAKE 1/2 TO 1 TABLET BY MOUTH DAILY AS DIRECTED BY COUMADIN CLINIC

## 2020-08-18 NOTE — Telephone Encounter (Signed)
Left detail message, okay via DPR on file that Jacquelyn, PA-C stated her LFTs look fine. Advised she may reach out to PCP regarding other labs order by [name], MD or call the clinic back for any concerns regarding to her cardiologist.

## 2020-08-21 ENCOUNTER — Other Ambulatory Visit: Payer: Self-pay | Admitting: Family Medicine

## 2020-08-26 ENCOUNTER — Other Ambulatory Visit: Payer: Self-pay

## 2020-08-26 ENCOUNTER — Ambulatory Visit: Payer: Medicare HMO

## 2020-08-26 DIAGNOSIS — Z7901 Long term (current) use of anticoagulants: Secondary | ICD-10-CM

## 2020-08-26 DIAGNOSIS — I4811 Longstanding persistent atrial fibrillation: Secondary | ICD-10-CM

## 2020-08-26 DIAGNOSIS — Z5181 Encounter for therapeutic drug level monitoring: Secondary | ICD-10-CM | POA: Diagnosis not present

## 2020-08-26 DIAGNOSIS — I4891 Unspecified atrial fibrillation: Secondary | ICD-10-CM

## 2020-08-26 LAB — POCT INR: INR: 1.8 — AB (ref 2.0–3.0)

## 2020-08-26 NOTE — Patient Instructions (Signed)
-   take extra 1/2 tablet tonight, then  - continue dosage of warfarin 1 tablet daily EXCEPT 1/2 tablet on MONDAYS & FRIDAYS. - recheck in 6 weeks.

## 2020-09-03 ENCOUNTER — Other Ambulatory Visit: Payer: Self-pay | Admitting: *Deleted

## 2020-09-03 MED ORDER — LOSARTAN POTASSIUM 25 MG PO TABS: 25.0000 mg | ORAL_TABLET | Freq: Every day | ORAL | 3 refills | Status: DC

## 2020-09-05 ENCOUNTER — Other Ambulatory Visit: Payer: Self-pay | Admitting: Family Medicine

## 2020-09-15 ENCOUNTER — Other Ambulatory Visit: Payer: Medicare HMO

## 2020-09-28 ENCOUNTER — Other Ambulatory Visit: Payer: Self-pay | Admitting: *Deleted

## 2020-09-28 MED ORDER — LEVOTHYROXINE SODIUM 50 MCG PO TABS: ORAL_TABLET | ORAL | 3 refills | Status: DC

## 2020-10-06 ENCOUNTER — Other Ambulatory Visit: Payer: Self-pay | Admitting: *Deleted

## 2020-10-06 MED ORDER — DONEPEZIL HCL 5 MG PO TABS: 5.0000 mg | ORAL_TABLET | Freq: Every day | ORAL | 3 refills | Status: DC

## 2020-10-07 ENCOUNTER — Ambulatory Visit: Payer: Medicare HMO

## 2020-10-07 ENCOUNTER — Other Ambulatory Visit: Payer: Self-pay

## 2020-10-07 DIAGNOSIS — Z7901 Long term (current) use of anticoagulants: Secondary | ICD-10-CM | POA: Diagnosis not present

## 2020-10-07 DIAGNOSIS — I4891 Unspecified atrial fibrillation: Secondary | ICD-10-CM | POA: Diagnosis not present

## 2020-10-07 DIAGNOSIS — Z5181 Encounter for therapeutic drug level monitoring: Secondary | ICD-10-CM | POA: Diagnosis not present

## 2020-10-07 LAB — POCT INR: INR: 2.3 (ref 2.0–3.0)

## 2020-10-07 NOTE — Patient Instructions (Signed)
-   continue dosage of warfarin 1 tablet daily EXCEPT 1/2 tablet on MONDAYS & FRIDAYS. - recheck in 6 weeks.  

## 2020-10-12 ENCOUNTER — Ambulatory Visit (INDEPENDENT_AMBULATORY_CARE_PROVIDER_SITE_OTHER): Payer: Medicare HMO | Admitting: Family Medicine

## 2020-10-12 ENCOUNTER — Encounter: Payer: Self-pay | Admitting: Family Medicine

## 2020-10-12 ENCOUNTER — Other Ambulatory Visit: Payer: Self-pay

## 2020-10-12 VITALS — BP 122/78 | HR 62 | Temp 96.1°F | Ht 67.5 in | Wt 162.0 lb

## 2020-10-12 DIAGNOSIS — M545 Low back pain, unspecified: Secondary | ICD-10-CM | POA: Diagnosis not present

## 2020-10-12 MED ORDER — PREDNISONE 10 MG PO TABS: ORAL_TABLET | ORAL | 0 refills | Status: DC

## 2020-10-12 MED ORDER — METHOCARBAMOL 500 MG PO TABS: 500.0000 mg | ORAL_TABLET | Freq: Three times a day (TID) | ORAL | 0 refills | Status: DC | PRN

## 2020-10-12 NOTE — Patient Instructions (Addendum)
Use a heating pad for 10 minutes at a time Once you warm up, walk a bit   Take prednisone as directed  It can make you hyper and hungry  Take it with food  This is for inflammation   The generic robaxin is a muscle relaxer to use as needed, caution of sedation   We will get you set up with physical therapy

## 2020-10-12 NOTE — Assessment & Plan Note (Signed)
Acute back pain with lumbar spasm in pt with prior h/o DDD and surgery  No neuro changes today  Much pain to straighten up , limited rom of spine  Advised careful use of heat and slow walking (watching for neuro symptoms)  Px prednisone 30 mg taper (disc side eff)  Also methocarbamol for spasm with caution of sedation  Urgent PT ref to [facility] as well  If severe/unbearable-inst to go to ER Otherwise update if changes and if no improvement

## 2020-10-12 NOTE — Progress Notes (Signed)
Subjective:    Patient ID: [name], female    DOB: 12-19-1945, 75 y.o.   MRN: [id number]  This visit occurred during the SARS-CoV-2 public health emergency.  Safety protocols were in place, including screening questions prior to the visit, additional usage of staff PPE, and extensive cleaning of exam room while observing appropriate contact time as indicated for disinfecting solutions.    HPI 75 yo pt of Dr Lorelei Pont presents with back pain  H/o a fib taking coumadin   Wt Readings from Last 3 Encounters:  10/12/20 162 lb (73.5 kg)  08/18/20 162 lb 8 oz (73.7 kg)  08/14/20 165 lb (74.8 kg)   25.00 kg/m  Per pt she picked up 2 heavy buckets on 10/09/20 and twisted her back (setting them up on something)  Felt something pull (not pain) during that  Then started to bother her more and she had a rough day Saturday (stayed in bed)  Yesterday - improved a bit  Taking tylenol   Low back - just a little above hips  Equal on both sides  Pain does not shoot down legs at all   Sitting still is the only thing that does not hurt  A few positions in bed are better than others   Pain is sharp and throbbing  Not tender  Also a pulling sensation when she stands  Hurts a lot to sneeze   No loss of urine/bowel continence  No foot drop or weakness No numbness or tingling     otc Heating pad Hot shower Muscle rub - something for arthritis (? Name) -possibly voltaren  Tylenol    Old LS xray from 2/18 DG Lumbar Spine 2-3 Views (Accession [id number]) (Order [id number]) Imaging Date: 10/10/2016 Department: [facility] Ordering/Authorizing: Gerda Diss, DO    Exam Status  Status  Final [99]   PACS Intelerad Image Link  Show images for DG Lumbar Spine 2-3 Views  Study Result  Narrative & Impression  CLINICAL DATA:  PT c/o Rt lower back pain since fall last Thursday. Pt also c/o chronic bilateral knee pain. Pt c/o difficulty with ambulation  and changes in position from standing/seated/supine.  EXAM: LUMBAR SPINE - 2-3 VIEW  COMPARISON:  CT 09/10/2012  FINDINGS: Previous median sternotomy. Negative for fracture or dislocation. Mild narrowing of interspaces L5-S1, L4-5. Negative for fracture. Patchy aortic calcifications without suggestion of aneurysm.  IMPRESSION: 1. Negative for fracture or other acute bone abnormality. 2. Degenerative disc disease L4-S1.   Electronically Signed   By: Lucrezia Europe M.D.   On: 10/10/2016 19:33   Also osteopenia on dexa from 2018  Last MRI is not in the chart   H/o lumbar disc surgery years ago -pt does not remember   Patient Active Problem List   Diagnosis Date Noted  . Essential hypertension 07/12/2018  . Insomnia 12/05/2017  . Low back pain 10/12/2016  . Primary osteoarthritis of both knees 10/12/2016  . Fracture of left pelvis ([place]) 04/29/2015  . PAF (paroxysmal atrial fibrillation) ([place]) 03/04/2015  . On amiodarone therapy 11/14/2014  . Current use of long term anticoagulation 11/14/2014  . Mild dementia ([place]) 10/24/2014  . Chronic combined systolic and diastolic CHF, NYHA class 1 ([place]) 09/29/2014  . 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 Hyperlipidemia LDL goal <100 04/05/2010  . GERD 04/05/2010  . 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 Hypothyroid 11/18/2008  . Generalized anxiety disorder  11/18/2008  . Major depressive disorder, recurrent episode, moderate with anxious distress ([place]) 11/18/2008  . ASTHMA 11/18/2008  . Mixed incontinence urge and stress 11/18/2008  . NEPHROLITHIASIS, HX OF 11/18/2008   Past Medical History:  Diagnosis Date  . Acne rosacea   . Anxiety   . Asthma   . Atrial fibrillation and flutter ([place]) 09/2014   Revereted from Afib RVR to 2:1 A Flutter -- TEE/ DCCV 2/9; on Amiodarone  . Chronic combined systolic and diastolic CHF, NYHA class 1 ([facility]) -- Systolic  dysfunction [id number][Z30.07] 09/2014   Exacerbation 09/2014 2/2 Afib/flutter with RVR - 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 Mod LA dilation, normal PA pressures   . CVA (cerebral infarction)    h/o superior cerebellar infarct  . Depression   . Digoxin toxicity 09/2014  . 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 Diverticulosis   . H/O: rheumatic fever    Childhood  . HYPERLIPIDEMIA 04/05/2010   Qualifier: Diagnosis of  By: Lorelei Pont MD, Frederico Hamman    . Hypothyroid    On replacement therapy; normal TSH 09/2014  . Migraine headache   . Osteopenia   . Paroxysmal atrial fibrillation ([place]) 2005; Recurrent 09/2014   a) 2005: s/p Cox Maze & LAA ligation; b) 09/2014: TEE-cardioversion  . Rheumatic mitral and aortic valve insufficiency 2005   a) s/p MV Ring repair; with Cox-Maze for Afib; b) Echo 2013: EF 60-65%,no Regional WMA, Gr 2 DD, no Sig MR ;; c) TEE 10/07/2014: Severlely thickened and calcified MV leaflets. Posterior MV leaflet is fixed and immobile. MAC. reduced excursion of the anterior MV leaflet. Mean MV gradient was 2m Hg and MVA calculated at cm2.    . Urinary incontinence    Past Surgical History:  Procedure Laterality Date  . ABDOMINAL HYSTERECTOMY    . CARDIAC CATHETERIZATION  Jan 2005   Pre-op R&LHC -- Nonobstructive CAD; normal PA pressures  . CARDIOVERSION  06/04/2012   Procedure: CARDIOVERSION;  Surgeon: JCarlena Bjornstad MD;  [facility];  Service: Cardiovascular;  Laterality: N/A;  . CARDIOVERSION N/A 10/07/2014   Procedure: CARDIOVERSION;  Surgeon: TSueanne Margarita MD;  [facility]  Service: Cardiovascular;  Laterality: N/A;  . COLONOSCOPY WITH PROPOFOL N/A 07/11/2017   Procedure: COLONOSCOPY WITH PROPOFOL;  Surgeon: [place], TBenay Pike MD;  Location: [facility] ENDOSCOPY;  Service: Gastroenterology;  Laterality: N/A;  . LUMBAR DISC SURGERY     x 2 distantly  . MITRAL VALVE REPAIR  2005   with [facility] Maze for [employer] . NM MYOVIEW LTD  4/7/'16   EF 37% with septal hypokinesis and diffuse hypokinesis. No ischemia or infarction. 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  .Marland KitchenRIGHT HEART CATHETERIZATION N/A 10/03/2014   Procedure: RIGHT HEART CATH;  Surgeon: DJolaine Artist MD;  [facility];  Service: Cardiovascular;  RAP 569mg, RVP 35/2/9 mmHg, PAP 45/12 mmHg, PCWP 16 mmHg; CO/I by Fick: 3.9/2.3; Ao/PA/SVC SaO2%: 97%/63%/65%.  . TEE WITHOUT CARDIOVERSION N/A 10/07/2014   Procedure: TRANSESOPHAGEAL ECHOCARDIOGRAM (TEE);  Surgeon:  Sueanne Margarita, MD;  Location[facility];  Service: Cardiovascular;  Laterality: N/A;  . THYROIDECTOMY, PARTIAL     partial-no cancer; now on thyroid replacement  . TRANSTHORACIC ECHOCARDIOGRAM  2013; 2/'16 & 5/'16   a) 2013: EF 60-65%, mild MR, Gr 2 DD; b) 2/'16 @ [place]: EF ~10% - severel global LV dysfunction (systolic & diastolic) - dilated LV & restrictive filling pattern - Gr 3 DD, mod reduced RV function, severely dilated LA - 6.4 cm, mod dilated RA, mod pl effusion, mod MR, mild TR; c) 5/10/'16:  EF 25-30%, mod LV dilation, no RWMA,Gr 3 DD w/ high LAP, MV sewing ring intact w/ Mod MR, Severe LA dilation  . TRANSTHORACIC ECHOCARDIOGRAM  January 2017:   EF improved to 45-50%. Mild diffuse HK. Mild MR. Moderate LA dilation. Normal PA pressures.   Social History   Tobacco Use  . Smoking status: Never Smoker  . Smokeless tobacco: Never Used  Vaping Use  . Vaping Use: Never used  Substance Use Topics  . Alcohol use: Not Currently  . Drug use: No   Family History  Problem  Relation Age of Onset  . Diabetes Mother   . Coronary artery disease Mother   . Kidney disease Mother   . Breast cancer Neg Hx    No Known Allergies Current Outpatient Medications on File Prior to Visit  Medication Sig Dispense Refill  . amiodarone (PACERONE) 200 MG tablet Take 1 tablet (200 mg total) by mouth daily. Please schedule office visit for further refills. Thank you! 30 tablet 11  . b complex vitamins tablet Take 1 tablet daily by mouth.    . Calcium Citrate-Vitamin D (CALCIUM + D PO) Take 1 capsule daily by mouth.     . carvedilol (COREG) 3.125 MG tablet TAKE 1 TABLET BY MOUTH TWICE A DAY 180 tablet 3  . donepezil (ARICEPT) 5 MG tablet Take 1 tablet (5 mg total) by mouth at bedtime. 90 tablet 3  . fluticasone (FLONASE) 50 MCG/ACT nasal spray Place 2 sprays into both nostrils daily. 48 g 3  . IRON, FERROUS SULFATE, PO Take 1 tablet by mouth daily.    Marland Kitchen levothyroxine (SYNTHROID) 50 MCG tablet TAKE 1 TAB BY MOUTH ONCE DAILY. TAKE ON AN EMPTY STOMACH WITH A GLASS OF WATER ATLEAST 30-60 MINUTES BEFORE BREAKFAST 90 tablet 3  . losartan (COZAAR) 25 MG tablet Take 1 tablet (25 mg total) by mouth daily. 90 tablet 3  . metroNIDAZOLE (METROCREAM) 0.75 % cream Apply topically 2 (two) times daily. 45 g 5  . Omega-3 Fatty Acids (FISH OIL PO) Take 1 capsule by mouth daily.    . permethrin (ELIMITE) 5 % cream APPLY PEA SIZED AMOUNT TO FACE ONCE A DAY  1  . traZODone (DESYREL) 50 MG tablet TAKE 1/2 TO 1 TABLET BY MOUTH AT BEDTIMEAS NEEDED FOR SLEEP 30 tablet 5  . warfarin (COUMADIN) 5 MG tablet TAKE 1/2 TO 1 TABLET BY MOUTH DAILY AS DIRECTED BY COUMADIN CLINIC 90 tablet 1   No current facility-administered medications on file prior to visit.     Review of Systems  Constitutional: Negative for activity change, appetite change, fatigue, fever and unexpected weight change.  HENT: Negative for congestion, ear pain, rhinorrhea, sinus pressure and sore throat.   Eyes: Negative for pain, redness and  visual disturbance.  Respiratory: Negative for cough, shortness of breath and wheezing.   Cardiovascular: Negative for chest pain and palpitations.  Gastrointestinal: Negative for abdominal pain, blood in stool,  constipation and diarrhea.  Endocrine: Negative for polydipsia and polyuria.  Genitourinary: Negative for dysuria, frequency and urgency.  Musculoskeletal: Positive for arthralgias and back pain. Negative for myalgias.       Back and knee pain   Skin: Negative for pallor and rash.  Allergic/Immunologic: Negative for environmental allergies.  Neurological: Negative for dizziness, syncope and headaches.  Hematological: Negative for adenopathy. Does not bruise/bleed easily.  Psychiatric/Behavioral: Negative for decreased concentration and dysphoric mood. The patient is not nervous/anxious.        Objective:   Physical Exam Constitutional:      General: She is not in acute distress.    Appearance: Normal appearance. She is well-developed, normal weight and well-nourished. She is not ill-appearing.  HENT:     Head: Normocephalic and atraumatic.  Eyes:     General: No scleral icterus.    Extraocular Movements: EOM normal.     Conjunctiva/sclera: Conjunctivae normal.     Pupils: Pupils are equal, round, and reactive to light.  Cardiovascular:     Rate and Rhythm: Normal rate and regular rhythm.  Pulmonary:     Effort: Pulmonary effort is normal.     Breath sounds: Normal breath sounds. No wheezing or rales.  Abdominal:     General: Bowel sounds are normal. There is no distension.     Palpations: Abdomen is soft.     Tenderness: There is no abdominal tenderness.  Musculoskeletal:        General: Tenderness present.     Cervical back: Normal range of motion and neck supple.     Lumbar back: Spasms and tenderness present. No swelling, edema, deformity or bony tenderness. Decreased range of motion. Negative right straight leg raise test and negative left straight leg raise test.      Comments: Midline lumbar surgery scar noted  Lumbar spasm -worse on the R  No bony/spinous tenderness  Neg bent knee raise  No neuro changes  Pain is relieved by flex to 90 deg  Worse with extension (unable to walk standing straight) and with lateral bend to either side    Lymphadenopathy:     Cervical: No cervical adenopathy.  Skin:    General: Skin is warm and dry.     Coloration: Skin is not pale.     Findings: No erythema or rash.  Neurological:     Mental Status: She is alert.     Cranial Nerves: No cranial nerve deficit.     Sensory: No sensory deficit.     Motor: No weakness, atrophy or abnormal muscle tone.     Coordination: Coordination is intact. Coordination normal.     Deep Tendon Reflexes: Strength normal and reflexes are normal and symmetric. Reflexes normal.     Comments: Negative SLR  Psychiatric:        Mood and Affect: Mood and affect and mood normal.           Assessment & Plan:   Problem List Items Addressed This Visit      Other   Low back pain - Primary    Acute back pain with lumbar spasm in pt with prior h/o DDD and surgery  No neuro changes today  Much pain to straighten up , limited rom of spine  Advised careful use of heat and slow walking (watching for neuro symptoms)  Px prednisone 30 mg taper (disc side eff)  Also methocarbamol for spasm with caution of sedation  Urgent PT ref to [facility] as well  If severe/unbearable-inst to go to ER Otherwise update if changes and if no improvement       Relevant Medications   predniSONE (DELTASONE) 10 MG tablet   methocarbamol (ROBAXIN) 500 MG tablet   Other Relevant Orders   Ambulatory referral to Physical Therapy

## 2020-10-20 ENCOUNTER — Ambulatory Visit (INDEPENDENT_AMBULATORY_CARE_PROVIDER_SITE_OTHER): Payer: Medicare HMO

## 2020-10-20 ENCOUNTER — Other Ambulatory Visit: Payer: Self-pay

## 2020-10-20 DIAGNOSIS — I480 Paroxysmal atrial fibrillation: Secondary | ICD-10-CM | POA: Diagnosis not present

## 2020-10-20 LAB — ECHOCARDIOGRAM COMPLETE
Area-P 1/2: 3.21 cm2
MV VTI: 1.19 cm2
S' Lateral: 3.3 cm

## 2020-11-03 ENCOUNTER — Other Ambulatory Visit: Payer: Self-pay | Admitting: Family Medicine

## 2020-11-03 DIAGNOSIS — Z1231 Encounter for screening mammogram for malignant neoplasm of breast: Secondary | ICD-10-CM

## 2020-11-16 ENCOUNTER — Other Ambulatory Visit: Payer: Self-pay

## 2020-11-16 ENCOUNTER — Ambulatory Visit
Admission: RE | Admit: 2020-11-16 | Discharge: 2020-11-16 | Disposition: A | Discharge status: Home / Self Care | Payer: Medicare HMO | Source: Ambulatory Visit | Attending: Family Medicine | Admitting: Family Medicine

## 2020-11-16 DIAGNOSIS — Z1231 Encounter for screening mammogram for malignant neoplasm of breast: Secondary | ICD-10-CM | POA: Diagnosis not present

## 2020-11-18 ENCOUNTER — Ambulatory Visit (INDEPENDENT_AMBULATORY_CARE_PROVIDER_SITE_OTHER): Payer: Medicare HMO

## 2020-11-18 ENCOUNTER — Telehealth: Payer: Self-pay

## 2020-11-18 ENCOUNTER — Other Ambulatory Visit: Payer: Self-pay

## 2020-11-18 DIAGNOSIS — Z5181 Encounter for therapeutic drug level monitoring: Secondary | ICD-10-CM | POA: Diagnosis not present

## 2020-11-18 DIAGNOSIS — I4891 Unspecified atrial fibrillation: Secondary | ICD-10-CM

## 2020-11-18 DIAGNOSIS — Z7901 Long term (current) use of anticoagulants: Secondary | ICD-10-CM | POA: Diagnosis not present

## 2020-11-18 LAB — POCT INR: INR: 2 (ref 2.0–3.0)

## 2020-11-18 NOTE — Patient Instructions (Signed)
-   don't have any greens for the next day or 2 - continue dosage of warfarin 1 tablet daily EXCEPT 1/2 tablet on [place]. - recheck in 6 weeks.

## 2020-11-18 NOTE — Telephone Encounter (Signed)
Patient called in and stated that she has been experiencing dysuria and increased urinary frequency since Friday. Patient reported that she has been taking Cystex and drinking plenty of fluids, but her symptoms have not improved. Patient denies fever, chills, SOB, chest pain, hematuria, or N/V/D. Appointment made with Dr. Lorelei Pont on 3/24 at 10:20. Instructed patient that if symptoms became worse to report to UC. Patient verbalized understanding.

## 2020-11-19 ENCOUNTER — Ambulatory Visit (INDEPENDENT_AMBULATORY_CARE_PROVIDER_SITE_OTHER): Payer: Medicare HMO | Admitting: Family Medicine

## 2020-11-19 ENCOUNTER — Encounter: Payer: Self-pay | Admitting: Family Medicine

## 2020-11-19 VITALS — BP 104/60 | HR 54 | Temp 98.0°F | Ht 67.5 in | Wt 163.5 lb

## 2020-11-19 DIAGNOSIS — N12 Tubulo-interstitial nephritis, not specified as acute or chronic: Secondary | ICD-10-CM | POA: Diagnosis not present

## 2020-11-19 DIAGNOSIS — R300 Dysuria: Secondary | ICD-10-CM | POA: Diagnosis not present

## 2020-11-19 DIAGNOSIS — Z7901 Long term (current) use of anticoagulants: Secondary | ICD-10-CM | POA: Diagnosis not present

## 2020-11-19 LAB — POC URINALSYSI DIPSTICK (AUTOMATED)
Bilirubin, UA: NEGATIVE
Glucose, UA: NEGATIVE
Ketones, UA: NEGATIVE
Nitrite, UA: POSITIVE
Protein, UA: POSITIVE — AB
Spec Grav, UA: 1.015 (ref 1.010–1.025)
Urobilinogen, UA: 0.2 E.U./dL
pH, UA: 6 (ref 5.0–8.0)

## 2020-11-19 MED ORDER — CIPROFLOXACIN HCL 500 MG PO TABS: 500.0000 mg | ORAL_TABLET | Freq: Two times a day (BID) | ORAL | 0 refills | Status: DC

## 2020-11-19 NOTE — Progress Notes (Signed)
Deborah Mamone [name], MD, [place] at [facility] [place], [address]  Phone: [phone]   FAX: [phone]  [name] - 75 y.o. female   MRN [id number]   Date of Birth: Oct 24, 1945  Date: 11/19/2020   PCP: Owens Loffler, MD   Referral: Owens Loffler, MD  Chief Complaint  Patient presents with   Dysuria   Back Pain   Chills   Headache    This visit occurred during the SARS-CoV-2 public health emergency.  Safety protocols were in place, including screening questions prior to the visit, additional usage of staff PPE, and extensive cleaning of exam room while observing appropriate contact time as indicated for disinfecting solutions.   Subjective:   This 75 y.o. female patient presents with burning, urgency.  I know the patient well, normally she is talkative, friendly and an upbeat person.  Today, she is laying on the examination table, and she globally does not look as if she feels well.  She has had some chills intermittently, headache, and significant dysuria and back pain.  She also has had some urgency.  She has had UTIs fairly frequently in the past, but generally she does not have some systemic symptoms as of now.  Pyelo with systemic symptoms  SEND AMANDA FROM CARDIOLOGY A NOTE  Review of Systems is noted in the HPI, as appropriate  Objective:   Blood pressure 104/60, pulse (!) 54, temperature 98 F (36.7 C), temperature source Temporal, height 5' 7.5" (1.715 m), weight 163 lb 8 oz (74.2 kg), SpO2 98 %.   GEN: WDWN HEENT: Atraumatc, normocephalic. CV: RRR, No M/G/R PULM: CTA B, No wheezes, crackles, or rhonchi ABD: S, NT, ND, +BS, no rebound. No suprapubic tenderness. On her back exam, not able to elicit any CVA tenderness.  She has no rib tenderness with compression.  Objective Data: Results for orders placed or performed in visit on 11/19/20  POCT  Urinalysis Dipstick (Automated)  Result Value Ref Range   Color, UA Yellow    Clarity, UA Hazy    Glucose, UA Negative Negative   Bilirubin, UA Negative    Ketones, UA Negative    Spec Grav, UA 1.015 1.010 - 1.025   Blood, UA Trace    pH, UA 6.0 5.0 - 8.0   Protein, UA Positive (A) Negative   Urobilinogen, UA 0.2 0.2 or 1.0 E.U./dL   Nitrite, UA Positive    Leukocytes, UA Large (3+) (A) Negative     Assessment and Plan:     ICD-10-CM   1. Pyelonephritis  N12   2. Dysuria  R30.0 POCT Urinalysis Dipstick (Automated)    Urine Culture  3. Current use of long term anticoagulation  Z79.01    I think that she has pyelonephritis, and I am going to treat her with a longer course of high-dose Cipro.  We will also await her culture  She is on Coumadin for atrial fibrillation, so the ciprofloxacin will alter her INR.  I am going to contact cardiology nursing to assist with earlier INR check next week.  She understands that with pyelonephritis, sometimes people fail outpatient management and ultimately require hospitalization if she does poorly over the next few days.  Meds ordered this encounter  Medications   ciprofloxacin (CIPRO) 500 MG tablet    Sig: Take 1 tablet (500 mg total) by mouth 2 (two) times daily.    Dispense:  20 tablet    Refill:  0   Orders Placed This Encounter  Procedures   Urine Culture   POCT Urinalysis Dipstick (Automated)    Signed,  Sanja Elizardo T. Brigit Doke, MD   Patient's Medications  New Prescriptions   CIPROFLOXACIN (CIPRO) 500 MG TABLET    Take 1 tablet (500 mg total) by mouth 2 (two) times daily.  Previous Medications   AMIODARONE (PACERONE) 200 MG TABLET    Take 1 tablet (200 mg total) by mouth daily. Please schedule office visit for further refills. Thank you!   B COMPLEX VITAMINS TABLET    Take 1 tablet daily by mouth.   CALCIUM CITRATE-VITAMIN D (CALCIUM + D PO)    Take 1 capsule daily by mouth.    CARVEDILOL (COREG) 3.125 MG TABLET    TAKE 1  TABLET BY MOUTH TWICE A DAY   DONEPEZIL (ARICEPT) 5 MG TABLET    Take 1 tablet (5 mg total) by mouth at bedtime.   FLUTICASONE (FLONASE) 50 MCG/ACT NASAL SPRAY    Place 2 sprays into both nostrils daily.   IRON, FERROUS SULFATE, PO    Take 1 tablet by mouth daily.   LEVOTHYROXINE (SYNTHROID) 50 MCG TABLET    TAKE 1 TAB BY MOUTH ONCE DAILY. TAKE ON AN EMPTY STOMACH WITH A GLASS OF WATER ATLEAST 30-60 MINUTES BEFORE BREAKFAST   LOSARTAN (COZAAR) 25 MG TABLET    Take 1 tablet (25 mg total) by mouth daily.   METHOCARBAMOL (ROBAXIN) 500 MG TABLET    Take 1 tablet (500 mg total) by mouth every 8 (eight) hours as needed for muscle spasms. Back pain,  Caution of sedation   METRONIDAZOLE (METROCREAM) 0.75 % CREAM    Apply topically 2 (two) times daily.   OMEGA-3 FATTY ACIDS (FISH OIL PO)    Take 1 capsule by mouth daily.   PERMETHRIN (ELIMITE) 5 % CREAM    APPLY PEA SIZED AMOUNT TO FACE ONCE A DAY   TRAZODONE (DESYREL) 50 MG TABLET    TAKE 1/2 TO 1 TABLET BY MOUTH AT BEDTIMEAS NEEDED FOR SLEEP   WARFARIN (COUMADIN) 5 MG TABLET    TAKE 1/2 TO 1 TABLET BY MOUTH DAILY AS DIRECTED BY COUMADIN CLINIC  Modified Medications   No medications on file  Discontinued Medications   PREDNISONE (DELTASONE) 10 MG TABLET    Take 3 pills once daily by mouth for 3 days, then 2 pills once daily for 3 days, then 1 pill once daily for 3 days and then stop

## 2020-11-21 LAB — URINE CULTURE
MICRO NUMBER:: 11688112
SPECIMEN QUALITY:: ADEQUATE

## 2020-11-25 ENCOUNTER — Other Ambulatory Visit: Payer: Self-pay

## 2020-11-25 ENCOUNTER — Ambulatory Visit (INDEPENDENT_AMBULATORY_CARE_PROVIDER_SITE_OTHER): Payer: Medicare HMO

## 2020-11-25 DIAGNOSIS — Z7901 Long term (current) use of anticoagulants: Secondary | ICD-10-CM | POA: Diagnosis not present

## 2020-11-25 DIAGNOSIS — Z5181 Encounter for therapeutic drug level monitoring: Secondary | ICD-10-CM | POA: Diagnosis not present

## 2020-11-25 DIAGNOSIS — I4891 Unspecified atrial fibrillation: Secondary | ICD-10-CM

## 2020-11-25 LAB — POCT INR: INR: 2.3 (ref 2.0–3.0)

## 2020-11-25 NOTE — Patient Instructions (Signed)
-   continue dosage of warfarin 1 tablet daily EXCEPT 1/2 tablet on [place]. - recheck in 2 weeks.

## 2020-12-03 ENCOUNTER — Ambulatory Visit (INDEPENDENT_AMBULATORY_CARE_PROVIDER_SITE_OTHER): Payer: Medicare HMO | Admitting: Family Medicine

## 2020-12-03 ENCOUNTER — Other Ambulatory Visit: Payer: Self-pay

## 2020-12-03 ENCOUNTER — Encounter: Payer: Self-pay | Admitting: Family Medicine

## 2020-12-03 VITALS — BP 120/76 | HR 47 | Temp 97.9°F | Ht 67.5 in | Wt 163.5 lb

## 2020-12-03 DIAGNOSIS — M170 Bilateral primary osteoarthritis of knee: Secondary | ICD-10-CM | POA: Diagnosis not present

## 2020-12-03 MED ORDER — TRIAMCINOLONE ACETONIDE 40 MG/ML IJ SUSP
40.0000 mg | Freq: Once | INTRAMUSCULAR | Status: AC
  Administered 2020-12-03: 40 mg via INTRA_ARTICULAR

## 2020-12-03 NOTE — Progress Notes (Signed)
    Mate Alegria T. Elgene Coral, MD, [place] at [facility] [place], [address]  Phone: [phone]  FAX: [phone]  [name] - 75 y.o. female  MRN [id number]  Date of Birth: 1945-12-11  Date: 12/03/2020  PCP: Owens Loffler, MD  Referral: Owens Loffler, MD  Chief Complaint  Patient presents with  . Knee Pain    Bilateral Knee Injections    This visit occurred during the SARS-CoV-2 public health emergency.  Safety protocols were in place, including screening questions prior to the visit, additional usage of staff PPE, and extensive cleaning of exam room while observing appropriate contact time as indicated for disinfecting solutions.   Subjective:   [name] is a 75 y.o. very pleasant female patient with Body mass index is 25.23 kg/m. who presents with the following:  I did aspirate and inject her knee in 03/2020 after an injury.  She has had a good response to steroid injection in the past.   Procedure only.  Aspiration/Injection Procedure Note [name] 1946/03/22 Date of procedure: 12/03/2020  Procedure: Large Joint Joint Aspiration / Injection of the Right Knee Indications: Pain  Procedure Details Patient verbally consented to procedure. Risks, benefits, and alternatives explained. Sterilely prepped with Chloraprep. Ethyl cholride used for anesthesia. 9 cc Lidocaine 1% mixed with 1 mL Kenalog 40 mg injected using the anteromedial approach without difficulty. No complications with procedure and tolerated well. Patient had decreased pain post-injection.  Medication: 1 mL of Kenalog 40 mg  Aspiration/Injection Procedure Note [name] 1946-02-02 Date of procedure: 12/03/2020  Procedure: Large Joint Aspiration / Injection of the Left Knee Indications: Pain  Procedure Details Patient verbally consented to procedure. Risks, benefits, and alternatives  explained. Sterilely prepped with Chloraprep. Ethyl cholride used for anesthesia. 9 cc Lidocaine 1% mixed with 1 mL Kenalog 40 mg injected using the anteromedial approach without difficulty. No complications with procedure and tolerated well. Patient had decreased pain post-injection.  Medication: 1 mL of Kenalog 40 mg    ICD-10-CM   1. Primary osteoarthritis of knees, bilateral  M17.0 triamcinolone acetonide (KENALOG-40) injection 40 mg    triamcinolone acetonide (KENALOG-40) injection 40 mg    Meds ordered this encounter  Medications  . triamcinolone acetonide (KENALOG-40) injection 40 mg  . triamcinolone acetonide (KENALOG-40) injection 40 mg   Medications Discontinued During This Encounter  Medication Reason  . ciprofloxacin (CIPRO) 500 MG tablet Completed Course  . methocarbamol (ROBAXIN) 500 MG tablet Completed Course    Signed,  Wolf Boulay T. Bradin Mcadory, MD

## 2020-12-09 ENCOUNTER — Ambulatory Visit (INDEPENDENT_AMBULATORY_CARE_PROVIDER_SITE_OTHER): Payer: Medicare HMO

## 2020-12-09 ENCOUNTER — Other Ambulatory Visit: Payer: Self-pay

## 2020-12-09 DIAGNOSIS — Z5181 Encounter for therapeutic drug level monitoring: Secondary | ICD-10-CM

## 2020-12-09 DIAGNOSIS — I4891 Unspecified atrial fibrillation: Secondary | ICD-10-CM

## 2020-12-09 DIAGNOSIS — Z7901 Long term (current) use of anticoagulants: Secondary | ICD-10-CM | POA: Diagnosis not present

## 2020-12-09 LAB — POCT INR: INR: 2.4 (ref 2.0–3.0)

## 2020-12-09 NOTE — Patient Instructions (Signed)
-   continue dosage of warfarin 1 tablet daily EXCEPT 1/2 tablet on [place]. - recheck in 4 weeks.

## 2020-12-28 ENCOUNTER — Telehealth: Payer: Self-pay

## 2020-12-28 ENCOUNTER — Other Ambulatory Visit: Payer: Self-pay | Admitting: Internal Medicine

## 2020-12-28 NOTE — Telephone Encounter (Signed)
Pt left v/m that starting around 12/26/20 pt developed urgency,.burning and pain upon urination. Pt said she finished abx wks ago and all urinary symptoms went away. Pt scheduled appt on 12/29/20 at 12 noon with Dr Einar Pheasant. UC & ED precautions given and pt voiced understanding. Sending note to DR Einar Pheasant.

## 2020-12-28 NOTE — Telephone Encounter (Signed)
Noted will see tomorrow 

## 2020-12-28 NOTE — Telephone Encounter (Signed)
Please review for refill. Thanks!  

## 2020-12-29 ENCOUNTER — Other Ambulatory Visit: Payer: Self-pay

## 2020-12-29 ENCOUNTER — Ambulatory Visit (INDEPENDENT_AMBULATORY_CARE_PROVIDER_SITE_OTHER): Payer: Medicare HMO | Admitting: Family Medicine

## 2020-12-29 VITALS — BP 120/70 | HR 55 | Temp 97.2°F | Ht 67.5 in | Wt 161.8 lb

## 2020-12-29 DIAGNOSIS — N3946 Mixed incontinence: Secondary | ICD-10-CM

## 2020-12-29 DIAGNOSIS — N898 Other specified noninflammatory disorders of vagina: Secondary | ICD-10-CM

## 2020-12-29 DIAGNOSIS — R300 Dysuria: Secondary | ICD-10-CM

## 2020-12-29 LAB — POC URINALSYSI DIPSTICK (AUTOMATED)
Bilirubin, UA: NEGATIVE
Blood, UA: NEGATIVE
Glucose, UA: NEGATIVE
Leukocytes, UA: NEGATIVE
Nitrite, UA: NEGATIVE
Protein, UA: POSITIVE — AB
Spec Grav, UA: 1.025 (ref 1.010–1.025)
Urobilinogen, UA: 0.2 E.U./dL
pH, UA: 5.5 (ref 5.0–8.0)

## 2020-12-29 NOTE — Assessment & Plan Note (Signed)
UTI in March but today with 4 day hx but UA w/o signs of infection. Offered culture to verify and pt would like this done. Also discussed alternative causes and treatment see plans.

## 2020-12-29 NOTE — Assessment & Plan Note (Signed)
Pt notes symptoms of urge, stress, as well as incontinence in the absence of a signal and wearing pads everyday for symptom management. She was on medication w/o help but also not sure when or what medication. Long discussion of option of seeing urology (which I recommended) and pelvic floor PT. Discussed that sometimes pelvic pt can help with symptoms w/o medication and she seemed interested in this. Referral placed. Advised she consider urology referral as well.

## 2020-12-29 NOTE — Assessment & Plan Note (Signed)
Painful intercourse of long duration and no sex for 1 year. Discussed GYN referral as option but she will start with trying water based lubrication (had only done vaseline) or coconut oil and reach out if not improving. Discussed it is possible the dryness is causing some of the dysuria as UA w/o signs of infection

## 2020-12-29 NOTE — Progress Notes (Signed)
Subjective:     [name] is a 75 y.o. female presenting for Back Pain and Dysuria (X 4 days )     HPI    #Burning with urination - started on 4/29 - hx of infection in March 2022 associated with back pain - wanted to come in sooner  - low energy - wants to rest - having back pain - all she wants to do is lay down - took cystex w/o improvement - has chronic urinary incontinence and has to wear a pad - the burning symptom is new w/ urination  #Incontinence - urgency to go - and cannot  - will also leak w/o trigger - believes she was on a medication in the past w/o improvement - spoke with dr. Lorelei Pont  - symptoms x 10  - endorses stress leakage - with cough/sneezing - water - 4-5 bottles per day - endorses painful intercourse which has been ongoing    #Back pain - lower back - between the hips  - symptoms getting worse since Friday, April 29  Review of Systems   Social History   Tobacco Use  Smoking Status Never Smoker  Smokeless Tobacco Never Used        Objective:    BP Readings from Last 3 Encounters:  12/29/20 120/70  12/03/20 120/76  11/19/20 104/60   Wt Readings from Last 3 Encounters:  12/29/20 161 lb 12 oz (73.4 kg)  12/03/20 163 lb 8 oz (74.2 kg)  11/19/20 163 lb 8 oz (74.2 kg)    BP 120/70   Pulse (!) 55   Temp (!) 97.2 F (36.2 C) (Temporal)   Ht 5' 7.5" (1.715 m)   Wt 161 lb 12 oz (73.4 kg)   SpO2 98%   BMI 24.96 kg/m    Physical Exam Constitutional:      General: She is not in acute distress.    Appearance: She is well-developed. She is not diaphoretic.  HENT:     Head: Normocephalic and atraumatic.     Right Ear: External ear normal.     Left Ear: External ear normal.  Eyes:     Conjunctiva/sclera: Conjunctivae normal.  Cardiovascular:     Rate and Rhythm: Normal rate and regular rhythm.     Heart sounds: Normal heart sounds.  Pulmonary:     Effort: Pulmonary effort is normal.  Abdominal:     General: Bowel  sounds are normal. There is no distension.     Palpations: Abdomen is soft.     Tenderness: There is abdominal tenderness (suprapubic). There is no right CVA tenderness, left CVA tenderness or guarding.  Musculoskeletal:     Cervical back: Neck supple.     Comments: No lumbar spine or paraspinous ttp  Skin:    General: Skin is warm and dry.     Capillary Refill: Capillary refill takes less than 2 seconds.  Neurological:     Mental Status: She is alert. Mental status is at baseline.  Psychiatric:        Mood and Affect: Mood normal.        Behavior: Behavior normal.     UA: neg le, neg nitrites      Assessment & Plan:   Problem List Items Addressed This Visit      Genitourinary   Vaginal dryness    Painful intercourse of long duration and no sex for 1 year. Discussed GYN referral as option but she will start with trying water based lubrication (  had only done vaseline) or coconut oil and reach out if not improving. Discussed it is possible the dryness is causing some of the dysuria as UA w/o signs of infection        Other   Dysuria    UTI in March but today with 4 day hx but UA w/o signs of infection. Offered culture to verify and pt would like this done. Also discussed alternative causes and treatment see plans.       Relevant Orders   POCT Urinalysis Dipstick (Automated) (Completed)   Urine Culture   Mixed incontinence urge and stress - Primary    Pt notes symptoms of urge, stress, as well as incontinence in the absence of a signal and wearing pads everyday for symptom management. She was on medication w/o help but also not sure when or what medication. Long discussion of option of seeing urology (which I recommended) and pelvic floor PT. Discussed that sometimes pelvic pt can help with symptoms w/o medication and she seemed interested in this. Referral placed. Advised she consider urology referral as well.       Relevant Orders   Ambulatory referral to Physical Therapy      >25 minutes was spent reviewing results, discussing with patient and documenting this note.   Return if symptoms worsen or fail to improve.  Lesleigh Noe, MD  This visit occurred during the SARS-CoV-2 public health emergency.  Safety protocols were in place, including screening questions prior to the visit, additional usage of staff PPE, and extensive cleaning of exam room while observing appropriate contact time as indicated for disinfecting solutions.

## 2020-12-29 NOTE — Patient Instructions (Signed)
1) Burning pain - recommend Azo - continue hydration - Will follow-up culture  2) Incontinence  - you will get a phone call from [facility] about scheduling pelvic floor physical therapy - I also think you would benefit from meeting with urology so reach out if you decide you want to see someone   3) Painful intercourse - recommend coconut oil, water based lubrication - if no improvement and Pelvic physical therapy not helping I recommend we have you see gynecology

## 2020-12-30 LAB — URINE CULTURE
MICRO NUMBER:: 11843716
Result:: NO GROWTH
SPECIMEN QUALITY:: ADEQUATE

## 2021-01-06 ENCOUNTER — Ambulatory Visit (INDEPENDENT_AMBULATORY_CARE_PROVIDER_SITE_OTHER): Payer: Medicare HMO

## 2021-01-06 ENCOUNTER — Other Ambulatory Visit: Payer: Self-pay

## 2021-01-06 DIAGNOSIS — Z7901 Long term (current) use of anticoagulants: Secondary | ICD-10-CM | POA: Diagnosis not present

## 2021-01-06 DIAGNOSIS — Z5181 Encounter for therapeutic drug level monitoring: Secondary | ICD-10-CM

## 2021-01-06 DIAGNOSIS — I4891 Unspecified atrial fibrillation: Secondary | ICD-10-CM | POA: Diagnosis not present

## 2021-01-06 LAB — POCT INR: INR: 2 (ref 2.0–3.0)

## 2021-01-06 NOTE — Patient Instructions (Signed)
-   continue dosage of warfarin 1 tablet daily EXCEPT 1/2 tablet on MONDAYS & FRIDAYS. - recheck in 5 weeks.  

## 2021-01-11 ENCOUNTER — Ambulatory Visit: Payer: Medicare HMO

## 2021-01-12 ENCOUNTER — Ambulatory Visit: Payer: Medicare HMO

## 2021-01-28 ENCOUNTER — Ambulatory Visit: Payer: Medicare HMO | Admitting: Family Medicine

## 2021-02-02 ENCOUNTER — Ambulatory Visit: Payer: Medicare HMO

## 2021-02-09 ENCOUNTER — Other Ambulatory Visit: Payer: Self-pay

## 2021-02-09 ENCOUNTER — Ambulatory Visit: Payer: Medicare HMO | Attending: Family Medicine

## 2021-02-09 DIAGNOSIS — M6281 Muscle weakness (generalized): Secondary | ICD-10-CM | POA: Insufficient documentation

## 2021-02-09 DIAGNOSIS — R293 Abnormal posture: Secondary | ICD-10-CM | POA: Diagnosis present

## 2021-02-09 DIAGNOSIS — R2689 Other abnormalities of gait and mobility: Secondary | ICD-10-CM | POA: Insufficient documentation

## 2021-02-09 DIAGNOSIS — R278 Other lack of coordination: Secondary | ICD-10-CM | POA: Diagnosis present

## 2021-02-09 DIAGNOSIS — N3946 Mixed incontinence: Secondary | ICD-10-CM | POA: Diagnosis present

## 2021-02-09 NOTE — Therapy (Signed)
[place] MAIN [facility] SERVICES [place], [place], [address] Phone: [phone]   Fax:  [phone]  Physical Therapy Evaluation  Patient Details  Name: [name] MRN: [id number] Date of Birth: Nov 11, 1945 Referring Provider (PT): Waunita Schooner, MD   Encounter Date: 02/09/2021   PT End of Session - 02/09/21 1201     Visit Number 1    Number of Visits 10    Date for PT Re-Evaluation 05/10/21    Authorization Type [employer]    Progress Note Due on Visit 10    PT Start Time 1104    PT Stop Time 1200    PT Time Calculation (min) 56 min    Activity Tolerance Patient tolerated treatment well    Behavior During Therapy [employer] for tasks assessed/performed             Past Medical History:  Diagnosis Date   Acne rosacea    Anxiety    Asthma    Atrial fibrillation and flutter ([place]) 09/2014   Revereted from Afib RVR to 2:1 A Flutter -- TEE/ DCCV 2/9; on Amiodarone   Chronic combined systolic and diastolic CHF, NYHA class 1 ([place]) -- Systolic dysfunction [id number][I77.82] 09/2014   Exacerbation 09/2014 2/2 Afib/flutter with RVR - 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 ([place]) 09/2014   a) [place] Echo: EF ~10% Severe LV dilation & global LV Systolic dysfxn, restrictive filling pattern - Gr 3 DD, mod RV dysfxn, severe LA dilation - 6.4 cm, mod RA dil, mod pl effusion, mod MR, mild TR; b) TEE 10/07/14: EF ~10%, Severe LV dilation & dysfxn Mod RV dysfxn, LAA oversewn, Mod RA & TR   Diverticulosis    H/O: rheumatic fever    Childhood   HYPERLIPIDEMIA 04/05/2010   Qualifier: Diagnosis of  By: [name] MD, Spencer     Hypothyroid    On replacement therapy; normal TSH 09/2014    Migraine headache    Osteopenia    Paroxysmal atrial fibrillation ([place]) 2005; Recurrent 09/2014   a) 2005: s/p Cox Maze & LAA ligation; b) 09/2014: TEE-cardioversion   Rheumatic mitral and aortic valve insufficiency 2005   a) s/p MV Ring repair; with Cox-Maze for Afib; b) Echo 2013: EF 60-65%,no Regional WMA, Gr 2 DD, no Sig MR ;; c) TEE 10/07/2014: Severlely thickened and calcified MV leaflets.  Posterior MV leaflet is fixed and immobile.  MAC.  reduced excursion of the anterior MV leaflet.  Mean MV gradient was 94m Hg and MVA calculated at cm2.      Urinary incontinence     Past Surgical History:  Procedure Laterality Date   ABDOMINAL HYSTERECTOMY     CARDIAC CATHETERIZATION  Jan 2005   Pre-op R&LHC -- Nonobstructive CAD; normal PA pressures   CARDIOVERSION  06/04/2012   Procedure: CARDIOVERSION;  Surgeon: JCarlena Bjornstad MD;  [facility]  Service: Cardiovascular;  Laterality: N/A;   CARDIOVERSION N/A 10/07/2014   Procedure: CARDIOVERSION;  Surgeon: TSueanne Margarita MD;  [facility]  Service: Cardiovascular;  Laterality: N/A;   COLONOSCOPY WITH PROPOFOL N/A 07/11/2017   Procedure: COLONOSCOPY WITH PROPOFOL;  Surgeon: [place], TBenay Pike MD;  Location: [facility] ENDOSCOPY;  Service: Gastroenterology;  Laterality: N/A;   LUMBAR DISC SURGERY     x 2 distantly   MITRAL VALVE REPAIR  2005   with Cox Maze for [employer]   NM MYOVIEW LTD  4/7/'16   EF 37% with septal hypokinesis and diffuse hypokinesis. No ischemia or infarction. 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   RIGHT HEART CATHETERIZATION N/A 10/03/2014   Procedure: RIGHT HEART CATH;  Surgeon: Jolaine Artist, MD;  Location: [facility] CATH LAB;  Service: Cardiovascular;  RAP 26mHg, RVP 35/2/9 mmHg, PAP 45/12 mmHg, PCWP 16 mmHg; CO/I by Fick: 3.9/2.3; Ao/PA/SVC SaO2%: 97%/63%/65%.   TEE WITHOUT CARDIOVERSION N/A 10/07/2014   Procedure: TRANSESOPHAGEAL ECHOCARDIOGRAM (TEE);   Surgeon: TSueanne Margarita MD;  Location[facility]  Service: Cardiovascular;  Laterality: N/A;   THYROIDECTOMY, PARTIAL     partial-no cancer; now on thyroid replacement   TRANSTHORACIC ECHOCARDIOGRAM  2013; 2/'16 & 5/'16   a) 2013: EF 60-65%, mild MR, Gr 2 DD; b) [facility] [facility] [place] ~10% - severel global LV dysfunction (systolic & diastolic) - dilated LV & restrictive filling pattern - Gr 3 DD, mod reduced RV function, severely dilated LA - 6.4 cm, mod dilated RA, mod pl effusion, mod MR, mild TR; c) 5/10/'16:  EF 25-30%, mod LV dilation, no RWMA,Gr 3 DD w/ high LAP, MV sewing ring intact w/ Mod MR, Severe LA dilation   TRANSTHORACIC ECHOCARDIOGRAM  January 2017:   EF improved to 45-50%. Mild diffuse HK. Mild MR. Moderate LA dilation. Normal PA pressures.    There were no vitals filed for this visit.    Subjective Assessment - 02/09/21 1119     Subjective Pt reported she's had urinary leakage for so long she can't remember when it started. Pt with h/o hysterectomy in 2005 and L hip fx about 10 years ago. Pt used to have pain and dryness with intercourse unitl she started using coconut oil as lubricant, it's really helped. See flowsheet for urinary, bowel and core info. Pt does have chronic intermittent back pain and hx of back surgeries. Pt states she thinks posture is a big factor. Pt stated hx of UTIs and sometimes has an issue with inability to void despite urge sensation and is feeling better with fully emptying bladder. Pt voids approx. 3-4x in two hours. Pt has nocturia-approx. 2 times/night.    Pertinent History Rheumatic mitral valve, hysterectomy, a-fib, CHF, Dilated cardiomyopathy 2/2 to tachy,  HTN, hypothyroid, GERD, mild dementia, B OA knees, fx of L pelvis (years ago about 10), vaginal dryness-pain with intercourse but no longer coconut oil, HLD, asthma-pt denied, allergies, depression, diverticulosis, h/o reheumatic fever, migraines, osteopenia  Back pain, SUI 3/22, painful intercourse     Patient Stated Goals I would love it if I didn't leak.    Currently in Pain? No/denies                [facility] - 02/09/21 1136       Assessment   Medical Diagnosis Mixed incontinence    Referring Provider (PT) JWaunita Schooner MD    Onset Date/Surgical Date 12/29/20   referral date   Hand Dominance Right    Prior Therapy none for pelvic floor      Precautions   Precautions Fall      Restrictions   [name] Bearing Restrictions No      Balance Screen   Has the patient fallen in the past 6 months Yes    How  many times? approx. 6   pt states she doesn't pick up feet 2/2 B knee pain/OA.   Has the patient had a decrease in activity level because of a fear of falling?  Yes   really limited 2/2 knees   Is the patient reluctant to leave their home because of a fear of falling?  No      Home Ecologist residence    Living Arrangements Spouse/significant other    Available Help at Discharge Family    Type of [place] to enter    Entrance Stairs-Number of Steps 3    Entrance Stairs-Rails None   wants to install [place] One level    [place] - single point      Prior Function   Level of Independence Independent    Vocation Retired    Leisure Haematologist, Training and development officer (Scientist, research (life sciences)), garden, Building services engineer, has Sales promotion account executive, Astronomer, Systems analyst, Animator, two dogs, turkeys      Cognition   Overall Cognitive Status Impaired/Different from baseline   hx of mild dementia from chart and pt                       Objective measurements completed on examination: See above findings.     Pelvic Floor Special Questions - 02/09/21 1124     Are you Pregnant or attempting pregnancy? No    Prior Pregnancies Yes    Number of Pregnancies 2    Number of Vaginal Deliveries 2    Any difficulty with labor and deliveries --   pt unsure as she can't remember   Episiotomy Performed --   unsure   Currently Sexually  Active Yes    Is this Painful No    History of sexually transmitted disease No    Marinoff Scale no problems    Urinary Leakage Yes    How often Constantly    Pad use Pt goes through approx. 45-50 pads every three weeks (buys the large bag), 4-5 times a day she changes pads, 8 at most/day.    Activities that cause leaking With strong urge;Coughing;Sneezing;Laughing;Lifting;Bending;Walking;Exercising;Intercourse    Urinary urgency Yes    Urinary frequency daily    Fecal incontinence No    Fluid intake 2 cups of coffee in am, drinks water (approx 32 oz. size, drinks 1.5d/day), coke or tea (sweeten with lemon, 2 glasses) if tired in the afternoon.    Caffeine beverages coffee, tea, coke    Falling out feeling (prolapse) No               Pelvic Floor Physical Therapy Evaluation and Assessment  Screenings: Red Flags: no Have you had any night sweats? Yes, pt states it's medication related Unexplained [name] loss? no Saddle anesthesia? no Unexplained changes in bowel or bladder changes? No  Pt states she has bowel movements 1-2/day. No c/o constipation or diarrhea.       OBJECTIVE  Posture/Observations:  Sitting: FHP and rounded shoulders and RLE crossed over let at knee and ankles.  Standing: Tx spine hyperkyphosis noted. Decr. Lx spine lordosis noted.  Palpation/Segmental Motion/Joint Play: hypomobility noted in standing, will formally assess in prone next session.     Range of Motion/Flexibilty:  Spine: decr. Sidebending R and L with Thoracolumbar pain noted by pt. Hips: decr. Movement of SI joint during R and L SLS, pt required min guard to min A to maintain balance during  SLS activities.    Strength/MMT:  LE MMT: seated, will perform in sidelying next session. LE MMT Left Right  Hip flex:  (L2) 3+/5 3+/5  Hip ext: /5 /5  Hip abd: 4/5 4/5  Hip add: 4+/5 4+/5  Hip IR /5 /5  Hip ER /5 /5   B knee flex: 4/5 B knee ext: 4/5 Pt reported B knee pain during  hip flex and knee ext/flex.    I Gait Analysis: Pt noted to amb. With decr. B toe clearance, decr. Trunk rotation and stride length.   Pelvic Floor Outcome Measures: FOTO: 34 (urinary problems)        SELF CARE:  PT Education - 02/09/21 1200     Education Details PT educated pt on PT POC, frequency, and duration. PT educated pt on pelvic floor function, the importance of wearing proper shoes, and being aware of proper posture. PT explained how B knee pain and hx of back surgeries and L hip fx may impact pelvic floor.    Person(s) Educated Patient    Methods Explanation    Comprehension Verbalized understanding           PT also encouraged pt to start day with 8oz. Cup of water vs. Coffee and to limit tea/coke prior to 3pm in afternoon to avoid nocturia and sleep disturbances.   PT Short Term Goals - 02/09/21 1214       PT SHORT TERM GOAL #1   Title Pt will be IND in HEP to improve leakage, posture, and strength. TARGET DATE FOR ALL STGS: 03/09/21    Time 4    Period Weeks    Status New      PT SHORT TERM GOAL #2   Title Pt will report using < 4 pads/day 2/2 incontinence to improve QOL and leakage.    Baseline 4-8 pads/day.    Time 4    Period Weeks    Status New      PT SHORT TERM GOAL #3   Title Pt will improve B SLS to 10 sec. on each side in order to improve safety during amb.    Time 4    Period Weeks    Status New               PT Long Term Goals - 02/09/21 1216       PT LONG TERM GOAL #1   Title Pt will be IND in progressed HEP to improve leakage, posture, strength and balance. TARGET DATE FOR ALL LTGS: 04/20/21    Time 10    Period Weeks    Status New      PT LONG TERM GOAL #2   Title Pt will improve FOTO (urinary problem) score from 34 to 50 for improve QOL.    Baseline 34    Time 10    Period Weeks    Status New      PT LONG TERM GOAL #3   Title Complete gait, strength and mobility assessment and write goals as indicated.    Time 10     Period Weeks    Status New      PT LONG TERM GOAL #4   Title Pt will report no falls over the last 4 weeks in order to improve safety, especially when trying to rush to the bathroom.    Baseline 6 falls in 6 months (approx.)    Time 10    Period Weeks    Status New  Plan - 02/09/21 1201     Clinical Impression Statement Pt is a pleasant 75y/o female presenting to pelvic health OPPT for mixed incontinence. Pt's PMH is significant for the following: Rheumatic mitral valve, hysterectomy, a-fib, CHF, Dilated cardiomyopathy 2/2 to tachy,  HTN, hypothyroid, GERD, mild dementia, B OA knees, fx of L pelvis (years ago about 10), vaginal dryness-pain with intercourse but no longer coconut oil, HLD, asthma-pt denied, allergies, depression, diverticulosis, h/o reheumatic fever, migraines, osteopenia  Back pain, SUI 3/22, painful intercourse. The following impairments were noted upon exam: postural dysfunction, stress and urge incontinence, gait deviations, impaired balance, decr. strength, decr. flexility. Pt's incontinence is likely multifactorial in nature 2/2 medical hx and impairment listed above. Today's session limited by time constraints, therefore, PT will formally assess gait, pelvic floor contraction, hip musculature next session. Pt would benefit from skilled PT to improve strength, leakage, posture, and balance in order to improve safety and QOL during all ADLs.    Personal Factors and Comorbidities Age;Time since onset of injury/illness/exacerbation;Comorbidity 3+    Comorbidities Rheumatic mitral valve, hysterectomy, a-fib, CHF, Dilated cardiomyopathy 2/2 to tachy,  HTN, hypothyroid, GERD, mild dementia, B OA knees, fx of L pelvis (years ago about 10), vaginal dryness-pain with intercourse but no longer coconut oil, HLD, asthma-pt denied, allergies, depression, diverticulosis, h/o reheumatic fever, migraines, osteopenia  Back pain, SUI 3/22, painful intercourse     Examination-Activity Limitations Bed Mobility;Bend;Locomotion Level;Carry;Toileting;Transfers;Continence;Squat    Examination-Participation Restrictions Laundry;Cleaning;Yard Work;Interpersonal Relationship    Stability/Clinical Decision Making Evolving/Moderate complexity    Clinical Decision Making Moderate    Rehab Potential Good    PT Frequency 1x / week    PT Duration Other (comment)   10 weeks   PT Treatment/Interventions ADLs/Self Care Home Management;Biofeedback;Functional mobility training;Stair training;Gait training;DME Instruction;Therapeutic activities;Therapeutic exercise;Neuromuscular re-education;Balance training;Patient/family education;Manual techniques;Dry needling;Spinal Manipulations;Joint Manipulations    PT Next Visit Plan Assess for DR, diaphragm restrictions, palpation of hip/spine musculature, external pelvic floor contraction, sacrum/coccyx mobility and begin HEP.    Consulted and Agree with Plan of Care Patient             Patient will benefit from skilled therapeutic intervention in order to improve the following deficits and impairments:  Abnormal gait, Decreased balance, Decreased endurance, Decreased mobility, Hypomobility, Decreased coordination, Decreased knowledge of use of DME, Decreased strength, Impaired flexibility, Postural dysfunction, Other (comment), Pain (pt reported pelvic pain with intercourse has ceased but PT will monitor and treat as indicated.)  Visit Diagnosis: Mixed incontinence - Plan: PT plan of care cert/re-cert  Other abnormalities of gait and mobility - Plan: PT plan of care cert/re-cert  Muscle weakness (generalized) - Plan: PT plan of care cert/re-cert  Abnormal posture - Plan: PT plan of care cert/re-cert  Other lack of coordination - Plan: PT plan of care cert/re-cert     Problem List Patient Active Problem List   Diagnosis Date Noted   Vaginal dryness 12/29/2020   Essential hypertension 07/12/2018   Insomnia  12/05/2017   Low back pain 10/12/2016   Primary osteoarthritis of both knees 10/12/2016   Fracture of left pelvis ([place]) 04/29/2015   PAF (paroxysmal atrial fibrillation) ([place]) 03/04/2015   On amiodarone therapy 11/14/2014   Current use of long term anticoagulation 11/14/2014   Mild dementia ([place]) 10/24/2014   Chronic combined systolic and diastolic CHF, NYHA class 1 ([place]) 09/29/2014   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 ([place]) 09/29/2014   Dysuria 06/30/2010   Hyperlipidemia LDL goal <100 04/05/2010   GERD 04/05/2010  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, status post MVR 07/27/2009   Hypothyroid 11/18/2008   Generalized anxiety disorder 11/18/2008   Major depressive disorder, recurrent episode, moderate with anxious distress ([place]) 11/18/2008   ASTHMA 11/18/2008   Mixed incontinence urge and stress 11/18/2008   NEPHROLITHIASIS, HX OF 11/18/2008    Jeyson Deshotel L 02/09/2021, 12:29 PM  [place] MAIN [facility] SERVICES [address] [place], [place], [address] Phone: [phone]   Fax:  [phone]  Name: [name] MRN: [id number] Date of Birth: 07-Feb-1946  Geoffry Paradise, PT,DPT 02/09/21 12:29 PM Phone: [phone] Fax: [phone]

## 2021-02-10 ENCOUNTER — Ambulatory Visit (INDEPENDENT_AMBULATORY_CARE_PROVIDER_SITE_OTHER): Payer: Medicare HMO

## 2021-02-10 DIAGNOSIS — Z5181 Encounter for therapeutic drug level monitoring: Secondary | ICD-10-CM

## 2021-02-10 DIAGNOSIS — Z7901 Long term (current) use of anticoagulants: Secondary | ICD-10-CM

## 2021-02-10 DIAGNOSIS — I4891 Unspecified atrial fibrillation: Secondary | ICD-10-CM | POA: Diagnosis not present

## 2021-02-10 LAB — POCT INR: INR: 2.5 (ref 2.0–3.0)

## 2021-02-10 NOTE — Patient Instructions (Signed)
-   continue dosage of warfarin 1 tablet daily EXCEPT 1/2 tablet on MONDAYS & FRIDAYS. - recheck in 6 weeks.  

## 2021-02-16 ENCOUNTER — Ambulatory Visit: Payer: Medicare HMO

## 2021-02-16 ENCOUNTER — Other Ambulatory Visit: Payer: Self-pay

## 2021-02-16 DIAGNOSIS — R2689 Other abnormalities of gait and mobility: Secondary | ICD-10-CM

## 2021-02-16 DIAGNOSIS — R278 Other lack of coordination: Secondary | ICD-10-CM

## 2021-02-16 DIAGNOSIS — R293 Abnormal posture: Secondary | ICD-10-CM

## 2021-02-16 DIAGNOSIS — M6281 Muscle weakness (generalized): Secondary | ICD-10-CM

## 2021-02-16 DIAGNOSIS — N3946 Mixed incontinence: Secondary | ICD-10-CM | POA: Diagnosis not present

## 2021-02-16 NOTE — Patient Instructions (Signed)
Access Code: [email or web address] URL: [email or web address] Date: 02/16/2021 Prepared by: Geoffry Paradise  Exercises Supine Cervical Retraction with Towel - 1 x daily - 7 x weekly - 1 sets - 5 reps - 5 hold Supine Diaphragmatic Breathing - 1-2 x daily - 7 x weekly - 1 sets - 5 reps

## 2021-02-16 NOTE — Therapy (Signed)
[place] MAIN [facility] SERVICES [address] [place], [place], [address] Phone: [phone]   Fax:  [phone]  Physical Therapy Treatment  Patient Details  Name: [name] MRN: [id number] Date of Birth: March 16, 1946 Referring Provider (PT): Waunita Schooner, MD   Encounter Date: 02/16/2021   PT End of Session - 02/16/21 1159     Visit Number 2    Number of Visits 10    Date for PT Re-Evaluation 05/10/21    Authorization Type [employer]    Progress Note Due on Visit 10    PT Start Time 1057    PT Stop Time 1154    PT Time Calculation (min) 57 min    Equipment Utilized During Treatment Other (comment)   S prn   Activity Tolerance Patient tolerated treatment well    Behavior During Therapy [facility] for tasks assessed/performed             Past Medical History:  Diagnosis Date   Acne rosacea    Anxiety    Asthma    Atrial fibrillation and flutter ([place]) 09/2014   Revereted from Afib RVR to 2:1 A Flutter -- TEE/ DCCV 2/9; on Amiodarone   Chronic combined systolic and diastolic CHF, NYHA class 1 ([place]) -- Systolic dysfunction [id number][U13.24] 09/2014   Exacerbation 09/2014 2/2 Afib/flutter with RVR - 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 ([place]) 09/2014   a) [place] Echo: EF ~10% Severe LV dilation & global LV Systolic dysfxn, restrictive filling pattern - Gr 3 DD, mod RV dysfxn, severe LA dilation - 6.4 cm, mod RA dil, mod pl effusion, mod MR, mild TR; b) TEE 10/07/14: EF ~10%, Severe LV dilation & dysfxn Mod RV dysfxn, LAA oversewn, Mod RA & TR   Diverticulosis    H/O: rheumatic fever    Childhood   HYPERLIPIDEMIA 04/05/2010   Qualifier: Diagnosis of  By: [name] MD, [place]      Hypothyroid    On replacement therapy; normal TSH 09/2014   Migraine headache    Osteopenia    Paroxysmal atrial fibrillation ([place]) 2005; Recurrent 09/2014   a) 2005: s/p Cox Maze & LAA ligation; b) 09/2014: TEE-cardioversion   Rheumatic mitral and aortic valve insufficiency 2005   a) s/p MV Ring repair; with Cox-Maze for Afib; b) Echo 2013: EF 60-65%,no Regional WMA, Gr 2 DD, no Sig MR ;; c) TEE 10/07/2014: Severlely thickened and calcified MV leaflets.  Posterior MV leaflet is fixed and immobile.  MAC.  reduced excursion of the anterior MV leaflet.  Mean MV gradient was 81m Hg and MVA calculated at cm2.      Urinary incontinence     Past Surgical History:  Procedure Laterality Date   ABDOMINAL HYSTERECTOMY     CARDIAC CATHETERIZATION  Jan 2005   Pre-op R&LHC -- Nonobstructive CAD; normal PA pressures   CARDIOVERSION  06/04/2012   Procedure: CARDIOVERSION;  Surgeon: JCarlena Bjornstad MD;  [place]  Service: Cardiovascular;  Laterality: N/A;   CARDIOVERSION N/A 10/07/2014   Procedure: CARDIOVERSION;  Surgeon: TSueanne Margarita MD;  [place]  Service: Cardiovascular;  Laterality: N/A;   COLONOSCOPY WITH PROPOFOL N/A 07/11/2017   Procedure: COLONOSCOPY WITH PROPOFOL;  Surgeon: [place], Benay Pike, MD;  Location: [facility];  Service: Gastroenterology;  Laterality: N/A;   LUMBAR DISC SURGERY     x 2 distantly   MITRAL VALVE REPAIR  2005   with Cox Maze for [employer]   NM MYOVIEW LTD  4/7/'16   EF 37% with septal hypokinesis and diffuse hypokinesis. No ischemia or infarction. 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   RIGHT HEART CATHETERIZATION N/A 10/03/2014   Procedure: RIGHT HEART CATH;  Surgeon: Jolaine Artist, MD;  Location: [facility] CATH LAB;  Service: Cardiovascular;  RAP 24mHg, RVP 35/2/9 mmHg, PAP 45/12 mmHg, PCWP 16 mmHg; CO/I by Fick: 3.9/2.3; Ao/PA/SVC SaO2%: 97%/63%/65%.   TEE WITHOUT CARDIOVERSION N/A  10/07/2014   Procedure: TRANSESOPHAGEAL ECHOCARDIOGRAM (TEE);  Surgeon: TSueanne Margarita MD;  [place]  Service: Cardiovascular;  Laterality: N/A;   THYROIDECTOMY, PARTIAL     partial-no cancer; now on thyroid replacement   TRANSTHORACIC ECHOCARDIOGRAM  2013; 2/'16 & 5/'16   a) 2013: EF 60-65%, mild MR, Gr 2 DD; b) 2/'16 @ [facility] EF ~10% - severel global LV dysfunction (systolic & diastolic) - dilated LV & restrictive filling pattern - Gr 3 DD, mod reduced RV function, severely dilated LA - 6.4 cm, mod dilated RA, mod pl effusion, mod MR, mild TR; c) 5/10/'16:  EF 25-30%, mod LV dilation, no RWMA,Gr 3 DD w/ high LAP, MV sewing ring intact w/ Mod MR, Severe LA dilation   TRANSTHORACIC ECHOCARDIOGRAM  January 2017:   EF improved to 45-50%. Mild diffuse HK. Mild MR. Moderate LA dilation. Normal PA pressures.    There were no vitals filed for this visit.   Subjective Assessment - 02/16/21 1100     Subjective Pt denied changes since last visit.    Patient Stated Goals I would love it if I didn't leak.    Currently in Pain? Yes    Pain Score 7     Pain Location Shoulder    Pain Orientation Right    Pain Descriptors / Indicators Aching;Sore    Pain Radiating Towards shoulder down into RUE    Pain Onset More than a month ago    Pain Frequency Intermittent    Aggravating Factors  lifting arm up, pt believes she did something like lifting heavy but she always has issues since she hurt it years ago    Pain Relieving Factors cream                [facility] PT Assessment - 02/16/21 1203       ROM / Strength   AROM / PROM / Strength Strength      Strength   Overall Strength Deficits    Overall Strength Comments Pt's B hip abd tested in sidelying: 3+/5.      Palpation   SI assessment  Decr. movement in SI joints but pt denied pain.    Palpation comment Pt reported tenderness over B greater trochanter during palpation. No TTP at coccyx or ischial tubs.                   NMR: Access Code: [email or web address]: [email or web address] Date: 02/16/2021 Prepared by: JGeoffry Paradise Exercises Supine Cervical Retraction with Towel - 1 x daily - 7 x weekly - 1 sets - 5 reps - 5 hold Supine Diaphragmatic Breathing - 1-2 x daily - 7 x weekly - 1 sets - 5 reps (repeated 3 sets)  Pt required cues and  demo for all activities to ensure proper technique. No pain reported during session.         [place] Adult PT Treatment/Exercise - 02/16/21 1159       Exercises   Exercises Knee/Hip      Knee/Hip Exercises: Supine   Other Supine Knee/Hip Exercises NMR: Pelvic floor contractions with intermittent external palpation to ensure pt is contracting muscles. 3x10 reps with rest breaks. Extensive cues to coordinate breath and decr. B glutes and B hip adductors in order to isolate pelvic floor muscles.      Manual Therapy   Manual Therapy Soft tissue mobilization    Manual therapy comments Pt reported no pain and less tension after manual therapy. Pt's B medial malleolus were even after manual therapy and in supine ASIS were also even.    Soft tissue mobilization PT performed B diaphragm trigger point release and massage to R QL and paraspinals.                    PT Education - 02/16/21 1157     Education Details PT provided pt with cervical retraction and diaphragmatic breathing HEP in order to maintain gains made in session and improve posture, to effectively contract pelvic floor musculature. PT discussed the importance of alignment and the contraction of pelvic floor. PT educated pt on the assessing internal pelvic floor musculature next session in order to determine MMT and tension of PF muscles, pt agreeable.    Person(s) Educated Patient    Methods Explanation;Demonstration;Tactile cues;Verbal cues;Handout    Comprehension Returned demonstration;Verbalized understanding;Need further instruction              PT Short Term  Goals - 02/09/21 1214       PT SHORT TERM GOAL #1   Title Pt will be IND in HEP to improve leakage, posture, and strength. TARGET DATE FOR ALL STGS: 03/09/21    Time 4    Period Weeks    Status New      PT SHORT TERM GOAL #2   Title Pt will report using < 4 pads/day 2/2 incontinence to improve QOL and leakage.    Baseline 4-8 pads/day.    Time 4    Period Weeks    Status New      PT SHORT TERM GOAL #3   Title Pt will improve B SLS to 10 sec. on each side in order to improve safety during amb.    Time 4    Period Weeks    Status New               PT Long Term Goals - 02/09/21 1216       PT LONG TERM GOAL #1   Title Pt will be IND in progressed HEP to improve leakage, posture, strength and balance. TARGET DATE FOR ALL LTGS: 04/20/21    Time 10    Period Weeks    Status New      PT LONG TERM GOAL #2   Title Pt will improve FOTO (urinary problem) score from 34 to 50 for improve QOL.    Baseline 34    Time 10    Period Weeks    Status New      PT LONG TERM GOAL #3   Title Complete gait, strength and mobility assessment and write goals as indicated.    Time 10    Period Weeks    Status New      PT LONG TERM GOAL #  4   Title Pt will report no falls over the last 4 weeks in order to improve safety, especially when trying to rush to the bathroom.    Baseline 6 falls in 6 months (approx.)    Time 10    Period Weeks    Status New                   Plan - 02/16/21 1204     Clinical Impression Statement Pt demonstrated progress during session as L hip elevation and LLE LLD resolved after manual therapy. Pt's posture also noted to improve after manual therapy to B diaphragm and cervical retraction. Pt required extensive cues for pelvic floor contraction in hooklying, as B hip adductors and B glute compensatory strategies noted, therefore, not provided as HEP and PT will formally assess pelvic floor MMT internally next session. No diastasis recti noted upon exam.  Pt noted to have difficulty during STS txfs, PT will address next session. Pt would continue ot benefit from skilled PT to improve incontinence, posture, pain, and strength during all ADLs.    Personal Factors and Comorbidities Age;Time since onset of injury/illness/exacerbation;Comorbidity 3+    Comorbidities Rheumatic mitral valve, hysterectomy, a-fib, CHF, Dilated cardiomyopathy 2/2 to tachy,  HTN, hypothyroid, GERD, mild dementia, B OA knees, fx of L pelvis (years ago about 10), vaginal dryness-pain with intercourse but no longer coconut oil, HLD, asthma-pt denied, allergies, depression, diverticulosis, h/o reheumatic fever, migraines, osteopenia  Back pain, SUI 3/22, painful intercourse    Examination-Activity Limitations Bed Mobility;Bend;Locomotion Level;Carry;Toileting;Transfers;Continence;Squat    Examination-Participation Restrictions Laundry;Cleaning;Yard Work;Interpersonal Relationship    Stability/Clinical Decision Making Evolving/Moderate complexity    Rehab Potential Good    PT Frequency 1x / week    PT Duration Other (comment)   10 weeks   PT Treatment/Interventions ADLs/Self Care Home Management;Biofeedback;Functional mobility training;Stair training;Gait training;DME Instruction;Therapeutic activities;Therapeutic exercise;Neuromuscular re-education;Balance training;Patient/family education;Manual techniques;Dry needling;Spinal Manipulations;Joint Manipulations    PT Next Visit Plan Gait. Internal pelvic floor assessment, sacrum/coccyx mobility, assess for hip alignment and treat prn, review HEP prn.    PT Home Exercise Plan Medbridge: [email or web address]    Consulted and Agree with Plan of Care Patient             Patient will benefit from skilled therapeutic intervention in order to improve the following deficits and impairments:  Abnormal gait, Decreased balance, Decreased endurance, Decreased mobility, Hypomobility, Decreased coordination, Decreased knowledge of use of DME, Decreased  strength, Impaired flexibility, Postural dysfunction, Other (comment), Pain (pt reported pelvic pain with intercourse has ceased but PT will monitor and treat as indicated.)  Visit Diagnosis: Mixed incontinence  Muscle weakness (generalized)  Other abnormalities of gait and mobility  Abnormal posture  Other lack of coordination     Problem List Patient Active Problem List   Diagnosis Date Noted   Vaginal dryness 12/29/2020   Essential hypertension 07/12/2018   Insomnia 12/05/2017   Low back pain 10/12/2016   Primary osteoarthritis of both knees 10/12/2016   Fracture of left pelvis ([place]) 04/29/2015   PAF (paroxysmal atrial fibrillation) ([place]) 03/04/2015   On amiodarone therapy 11/14/2014   Current use of long term anticoagulation 11/14/2014   Mild dementia ([place]) 10/24/2014   Chronic combined systolic and diastolic CHF, NYHA class 1 ([place]) 09/29/2014   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 [place], [place], [address] Phone: [phone]   Fax:  [phone]  Name: TAWANIA DAPONTE MRN: [id number] Date of Birth: 1946-01-21   Geoffry Paradise, PT,DPT 02/16/21 12:10 PM Phone: [phone] Fax: [phone]

## 2021-02-18 ENCOUNTER — Other Ambulatory Visit: Payer: Self-pay | Admitting: Family Medicine

## 2021-02-22 ENCOUNTER — Other Ambulatory Visit: Payer: Self-pay | Admitting: *Deleted

## 2021-02-22 MED ORDER — TRAZODONE HCL 50 MG PO TABS: ORAL_TABLET | ORAL | 5 refills | Status: DC

## 2021-02-24 ENCOUNTER — Ambulatory Visit: Payer: Medicare HMO

## 2021-02-24 ENCOUNTER — Other Ambulatory Visit: Payer: Self-pay

## 2021-02-24 DIAGNOSIS — N3946 Mixed incontinence: Secondary | ICD-10-CM | POA: Diagnosis not present

## 2021-02-24 DIAGNOSIS — R2689 Other abnormalities of gait and mobility: Secondary | ICD-10-CM

## 2021-02-24 DIAGNOSIS — M6281 Muscle weakness (generalized): Secondary | ICD-10-CM

## 2021-02-24 DIAGNOSIS — R293 Abnormal posture: Secondary | ICD-10-CM

## 2021-02-24 DIAGNOSIS — R278 Other lack of coordination: Secondary | ICD-10-CM

## 2021-02-24 NOTE — Patient Instructions (Signed)
Access Code: [email or web address] URL: [email or web address] Date: 02/24/2021 Prepared by: Geoffry Paradise  Exercises Supine Cervical Retraction with Towel - 1 x daily - 7 x weekly - 1 sets - 5 reps - 5 hold Supine Diaphragmatic Breathing - 1-2 x daily - 7 x weekly - 1 sets - 5 reps Supine Pelvic Floor Contraction - 1 x daily - 7 x weekly - 3 sets - 10 reps

## 2021-02-24 NOTE — Therapy (Signed)
[place] MAIN [facility] SERVICES [address] [place], [place], [address] Phone: [phone]   Fax:  [phone]  Physical Therapy Treatment  Patient Details  Name: [name] MRN: [id number] Date of Birth: 17-Feb-1946 Referring Provider (PT): Waunita Schooner, MD   Encounter Date: 02/24/2021   PT End of Session - 02/24/21 1106     Visit Number 3    Number of Visits 10    Date for PT Re-Evaluation 05/10/21    Authorization Type [employer]    PT Start Time 1008    PT Stop Time 1102    PT Time Calculation (min) 54 min    Equipment Utilized During Treatment Other (comment)   S prn   Activity Tolerance Patient tolerated treatment well    Behavior During Therapy [facility] for tasks assessed/performed             Past Medical History:  Diagnosis Date   Acne rosacea    Anxiety    Asthma    Atrial fibrillation and flutter ([place]) 09/2014   Revereted from Afib RVR to 2:1 A Flutter -- TEE/ DCCV 2/9; on Amiodarone   Chronic combined systolic and diastolic CHF, NYHA class 1 ([place]) -- Systolic dysfunction [id number][G38.75] 09/2014   Exacerbation 09/2014 2/2 Afib/flutter with RVR - 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 ([place]) 09/2014   a) [place] Echo: EF ~10% Severe LV dilation & global LV Systolic dysfxn, restrictive filling pattern - Gr 3 DD, mod RV dysfxn, severe LA dilation - 6.4 cm, mod RA dil, mod pl effusion, mod MR, mild TR; b) TEE 10/07/14: EF ~10%, Severe LV dilation & dysfxn Mod RV dysfxn, LAA oversewn, Mod RA & TR   Diverticulosis    H/O: rheumatic fever    Childhood   HYPERLIPIDEMIA 04/05/2010   Qualifier: Diagnosis of  By: [name] MD, [place]     Hypothyroid    On replacement  therapy; normal TSH 09/2014   Migraine headache    Osteopenia    Paroxysmal atrial fibrillation ([place]) 2005; Recurrent 09/2014   a) 2005: s/p Cox Maze & LAA ligation; b) 09/2014: TEE-cardioversion   Rheumatic mitral and aortic valve insufficiency 2005   a) s/p MV Ring repair; with Cox-Maze for Afib; b) Echo 2013: EF 60-65%,no Regional WMA, Gr 2 DD, no Sig MR ;; c) TEE 10/07/2014: Severlely thickened and calcified MV leaflets.  Posterior MV leaflet is fixed and immobile.  MAC.  reduced excursion of the anterior MV leaflet.  Mean MV gradient was 53m Hg and MVA calculated at cm2.      Urinary incontinence     Past Surgical History:  Procedure Laterality Date   ABDOMINAL HYSTERECTOMY     CARDIAC CATHETERIZATION  Jan 2005   Pre-op R&LHC -- Nonobstructive CAD; normal PA pressures   CARDIOVERSION  06/04/2012   Procedure: CARDIOVERSION;  Surgeon: JCarlena Bjornstad MD;  [place] [facility]  Service: Cardiovascular;  Laterality: N/A;   CARDIOVERSION N/A 10/07/2014   Procedure: CARDIOVERSION;  Surgeon: TSueanne Margarita MD;  [place]  Service: Cardiovascular;  Laterality: N/A;   COLONOSCOPY WITH PROPOFOL N/A 07/11/2017   Procedure: COLONOSCOPY WITH PROPOFOL;  Surgeon: [place], TBenay Pike MD;  Location: [facility] ENDOSCOPY;  Service: Gastroenterology;  Laterality: N/A;   LUMBAR DISC SURGERY     x 2 distantly   MITRAL VALVE REPAIR  2005   with [facility] Maze for [employer]   NM MYOVIEW LTD  4/7/'16   EF 37% with septal hypokinesis and diffuse hypokinesis. No ischemia or infarction. 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   RIGHT HEART CATHETERIZATION N/A 10/03/2014   Procedure: RIGHT HEART CATH;  Surgeon: Jolaine Artist, MD;  Location: [facility] CATH LAB;  Service: Cardiovascular;  RAP 62mHg, RVP 35/2/9 mmHg, PAP 45/12 mmHg, PCWP 16 mmHg; CO/I by Fick: 3.9/2.3; Ao/PA/SVC SaO2%: 97%/63%/65%.   TEE WITHOUT CARDIOVERSION N/A 10/07/2014   Procedure:  TRANSESOPHAGEAL ECHOCARDIOGRAM (TEE);  Surgeon: TSueanne Margarita MD;  [place]  Service: Cardiovascular;  Laterality: N/A;   THYROIDECTOMY, PARTIAL     partial-no cancer; now on thyroid replacement   TRANSTHORACIC ECHOCARDIOGRAM  2013; 2/'16 & 5/'16   a) 2013: EF 60-65%, mild MR, Gr 2 DD; [place]) [place] ~10% - severel global LV dysfunction (systolic & diastolic) - dilated LV & restrictive filling pattern - Gr 3 DD, mod reduced RV function, severely dilated LA - 6.4 cm, mod dilated RA, mod pl effusion, mod MR, mild TR; c) 5/10/'16:  EF 25-30%, mod LV dilation, no RWMA,Gr 3 DD w/ high LAP, MV sewing ring intact w/ Mod MR, Severe LA dilation   TRANSTHORACIC ECHOCARDIOGRAM  January 2017:   EF improved to 45-50%. Mild diffuse HK. Mild MR. Moderate LA dilation. Normal PA pressures.    There were no vitals filed for this visit.   Subjective Assessment - 02/24/21 1011     Subjective Pt denied changes since last visit. Pt reported she performed HEP on most days since last visit.    Pertinent History Rheumatic mitral valve, hysterectomy, a-fib, CHF, Dilated cardiomyopathy 2/2 to tachy,  HTN, hypothyroid, GERD, mild dementia, B OA knees, fx of [name] pelvis (years ago about 10), vaginal dryness-pain with intercourse but no longer coconut oil, HLD, asthma-pt denied, allergies, depression, diverticulosis, h/o reheumatic fever, migraines, osteopenia  Back pain, SUI 3/22, painful intercourse    Patient Stated Goals I would love it if I didn't leak.    Currently in Pain? Yes    Pain Score --   6-7/10   Pain Location Shoulder    Pain Orientation Right    Pain Descriptors / Indicators Aching;Sore    Pain Radiating Towards shoulder down into RUE    Pain Onset More than a month ago    Pain Frequency Intermittent    Aggravating Factors  lifting UE up    Pain Relieving Factors cream    Multiple Pain Sites Yes    Pain Score 3    Pain Location Knee    Pain Orientation Left;Right    Pain  Descriptors / Indicators Dull    Pain Type Chronic pain    Pain Onset More than a month ago    Pain Frequency Constant    Aggravating Factors  sit<>stand txfs    Pain Relieving Factors =                            Pelvic Floor Special Questions - 02/24/21 1110     Pelvic Floor Internal Exam No tension or pain noted.    Exam Type Vaginal    Sensation intact    Palpation No tension noted or pain reported.  Strength good squeeze, good lift, able to hold agaisnt strong resistance    Strength # of reps 10   with decr. strength noted after approx. 7-8 reps.   Strength # of seconds --   3-4 sec.            Txfs and gait: NMR  [facility] Adult PT Treatment/Exercise - 02/24/21 1014       Transfers   Transfers Sit to Stand;Stand to Sit    Sit to Stand 5: Supervision;7: Independent    Sit to Stand Details Verbal cues for sequencing;Verbal cues for technique;Visual cues/gestures for sequencing    Sit to Stand Details (indicate cue type and reason) Pt performed approx. 20 STS txfs with PT cueing pt to exhale and then contract pelvic floor prior to standing to decr. leakage and safety during STS txfs. Sequencing cues for safety.    Comments pt progressed to IND with safety during txfs.      Ambulation/Gait   Ambulation/Gait Yes    Ambulation/Gait Assistance 7: Independent    Ambulation Distance (Feet) 200 Feet    Assistive device None    Gait Pattern Step-through pattern;Decreased stride length;Lateral trunk lean to right    Ambulation Surface Level;Indoor      Manual Therapy   Manual Therapy Soft tissue mobilization;Myofascial release;Joint mobilization    Manual therapy comments Pt's medial malleolus even after manual therapy.    Joint Mobilization PAs to T3-T10, Grade 2-3, 2x30 sec. Then wash cloth rolled and placed along Tx spine during pelvic floor muscles.    Soft tissue mobilization PT performed massage and myofascial release to R QL and paraspinals. PT placed              NMR:  Access Code: [email or web address] URL: [email or web address] Date: 02/24/2021 Prepared by: Geoffry Paradise  Exercises Supine Cervical Retraction with Towel - 1 x daily - 7 x weekly - 1 sets - 5 reps - 5 hold (reviewed) Supine Diaphragmatic Breathing - 1-2 x daily - 7 x weekly - 1 sets - 5 reps (reviewed) Supine Pelvic Floor Contraction - 1 x daily - 7 x weekly - 3 sets - 10 reps with hip abd to [name] and R side and x5 reps with endurance hold of 3-4 sec.  Pt required cues and demo for proper technique (during manual muscle assessment and pelvic floor HEP). Pt able to perform IND after cueing.        SELF CARE:  PT Education - 02/24/21 1105     Education Details PT reviewed HEP and and added pelvic floor contractions. PT educated pt on the gait deviations and the importance of wearing supportive shoes with heel strap 2/2 feet and pelvic floor musculature relationship. PT discussed pelvic floor muscle assessment findings.    Person(s) Educated Patient    Methods Explanation;Demonstration;Verbal cues;Handout    Comprehension Returned demonstration;Verbalized understanding;Need further instruction              PT Short Term Goals - 02/09/21 1214       PT SHORT TERM GOAL #1   Title Pt will be IND in HEP to improve leakage, posture, and strength. TARGET DATE FOR ALL STGS: 03/09/21    Time 4    Period Weeks    Status New      PT SHORT TERM GOAL #2   Title Pt will report using < 4 pads/day 2/2 incontinence to improve QOL and leakage.    Baseline 4-8 pads/day.    Time 4  Period Weeks    Status New      PT SHORT TERM GOAL #3   Title Pt will improve B SLS to 10 sec. on each side in order to improve safety during amb.    Time 4    Period Weeks    Status New               PT Long Term Goals - 02/09/21 1216       PT LONG TERM GOAL #1   Title Pt will be IND in progressed HEP to improve leakage, posture, strength and balance. TARGET DATE FOR  ALL LTGS: 04/20/21    Time 10    Period Weeks    Status New      PT LONG TERM GOAL #2   Title Pt will improve FOTO (urinary problem) score from 34 to 50 for improve QOL.    Baseline 34    Time 10    Period Weeks    Status New      PT LONG TERM GOAL #3   Title Complete gait, strength and mobility assessment and write goals as indicated.    Time 10    Period Weeks    Status New      PT LONG TERM GOAL #4   Title Pt will report no falls over the last 4 weeks in order to improve safety, especially when trying to rush to the bathroom.    Baseline 6 falls in 6 months (approx.)    Time 10    Period Weeks    Status New                   Plan - 02/24/21 1110     Clinical Impression Statement PT performed internal muscle assessment today with 3/5 strength noted, no pain noted. Pt able to hold pelvic floor contraction for approx. 3-4 sec. before fatigue set in. Pt noted to amb. with decr. heel strike, R lateral trunk lean and LLE longer than RLE during palpation of ASIS and during gait. LLD not noted after manual therapy. Pt progressed from S during STS txfs to IND after sequencing cues and no leakage reported with pelvic floor contraction during exhale and sit to stand txf. Pt would continue to benefit from skilled PT to improve leakage, strength and balance/safety during all ADLs.    Comorbidities Rheumatic mitral valve, hysterectomy, a-fib, CHF, Dilated cardiomyopathy 2/2 to tachy,  HTN, hypothyroid, GERD, mild dementia, B OA knees, fx of [name] pelvis (years ago about 10), vaginal dryness-pain with intercourse but no longer coconut oil, HLD, asthma-pt denied, allergies, depression, diverticulosis, h/o reheumatic fever, migraines, osteopenia  Back pain, SUI 3/22, painful intercourse    PT Treatment/Interventions ADLs/Self Care Home Management;Biofeedback;Functional mobility training;Stair training;Gait training;DME Instruction;Therapeutic activities;Therapeutic exercise;Neuromuscular  re-education;Balance training;Patient/family education;Manual techniques;Dry needling;Spinal Manipulations;Joint Manipulations    PT Next Visit Plan STS txf with PF contraction, SLS balance with PF contractoin, sacrum/coccyx mobility, assess for hip alignment and treat prn, review HEP prn.    PT [place]: [email or web address]             Patient will benefit from skilled therapeutic intervention in order to improve the following deficits and impairments:  Abnormal gait, Decreased balance, Decreased endurance, Decreased mobility, Hypomobility, Decreased coordination, Decreased knowledge of use of DME, Decreased strength, Impaired flexibility, Postural dysfunction, Other (comment), Pain  Visit Diagnosis: Mixed incontinence  Muscle weakness (generalized)  Other abnormalities of gait and mobility  Abnormal posture  Other lack  of coordination     Problem List Patient Active Problem List   Diagnosis Date Noted   Vaginal dryness 12/29/2020   Essential hypertension 07/12/2018   Insomnia 12/05/2017   Low back pain 10/12/2016   Primary osteoarthritis of both knees 10/12/2016   Fracture of left pelvis ([place]) 04/29/2015   PAF (paroxysmal atrial fibrillation) ([place]) 03/04/2015   On amiodarone therapy 11/14/2014   Current use of long term anticoagulation 11/14/2014   Mild dementia ([place]) 10/24/2014   Chronic combined systolic and diastolic CHF, NYHA class 1 ([place]) 09/29/2014   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 ([place]) 09/29/2014   Dysuria 06/30/2010   Hyperlipidemia LDL goal <100 04/05/2010   GERD 04/05/2010   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 [place], [place], [address] Phone: [phone]   Fax:  [phone]  Name: [name] MRN: [id number] Date of Birth: Mar 05, 1946   Geoffry Paradise, PT,DPT 02/24/21 11:19 AM Phone: [phone] Fax: [phone]

## 2021-03-10 ENCOUNTER — Other Ambulatory Visit: Payer: Self-pay

## 2021-03-10 ENCOUNTER — Ambulatory Visit: Payer: Medicare HMO | Attending: Family Medicine

## 2021-03-10 DIAGNOSIS — R293 Abnormal posture: Secondary | ICD-10-CM | POA: Diagnosis present

## 2021-03-10 DIAGNOSIS — M6281 Muscle weakness (generalized): Secondary | ICD-10-CM | POA: Insufficient documentation

## 2021-03-10 DIAGNOSIS — R2689 Other abnormalities of gait and mobility: Secondary | ICD-10-CM | POA: Diagnosis present

## 2021-03-10 DIAGNOSIS — R278 Other lack of coordination: Secondary | ICD-10-CM | POA: Insufficient documentation

## 2021-03-10 DIAGNOSIS — N3946 Mixed incontinence: Secondary | ICD-10-CM | POA: Diagnosis present

## 2021-03-10 NOTE — Progress Notes (Signed)
    [place] Bruun T. Verner Kopischke, MD, [place] at [facility] [place], [address]  Phone: [phone]  FAX: [phone]  [name] - 75 y.o. female  MRN [id number]  Date of Birth: [birth year]  Date: 03/11/2021  PCP: Owens Loffler, MD  Referral: Owens Loffler, MD  Chief Complaint  Patient presents with   Knee Pain    Bilateral Injections    This visit occurred during the SARS-CoV-2 public health emergency.  Safety protocols were in place, including screening questions prior to the visit, additional usage of staff PPE, and extensive cleaning of exam room while observing appropriate contact time as indicated for disinfecting solutions.    Aspiration/Injection Procedure Note [name] 1945/12/02 Date of procedure: 03/11/2021  Procedure: Large Joint Joint Aspiration / Injection of the Right Knee Indications: Pain  Procedure Details Patient verbally consented to procedure. Risks, benefits, and alternatives explained. Sterilely prepped with Chloraprep. Ethyl cholride used for anesthesia. 9 cc Lidocaine 1% mixed with 1 mL Kenalog 40 mg injected using the anteromedial approach without difficulty. No complications with procedure and tolerated well. Patient had decreased pain post-injection.  Medication: 1 mL of Kenalog 40 mg  Aspiration/Injection Procedure Note [name] 09/29/1945 Date of procedure: 03/11/2021  Procedure: Large Joint Aspiration / Injection of the Left Knee Indications: Pain  Procedure Details Patient verbally consented to procedure. Risks, benefits, and alternatives explained. Sterilely prepped with Chloraprep. Ethyl cholride used for anesthesia. 9 cc Lidocaine 1% mixed with 1 mL Kenalog 40 mg injected using the anteromedial approach without difficulty. No complications with procedure and tolerated well. Patient had decreased pain post-injection.  Medication: 1 mL of Kenalog 40 mg     ICD-10-CM    1. Primary osteoarthritis of knees, bilateral  M17.0 triamcinolone acetonide (KENALOG-40) injection 40 mg    triamcinolone acetonide (KENALOG-40) injection 40 mg      Meds ordered this encounter  Medications   triamcinolone acetonide (KENALOG-40) injection 40 mg   triamcinolone acetonide (KENALOG-40) injection 40 mg

## 2021-03-10 NOTE — Therapy (Signed)
[place] MAIN [facility] SERVICES [address] [place], [place], [address] Phone: [phone]   Fax:  [phone]  Physical Therapy Treatment  Patient Details  Name: [name] MRN: [id number] Date of Birth: [birth year] Referring Provider (PT): Waunita Schooner, MD   Encounter Date: 03/10/2021   PT End of Session - 03/10/21 1338     Visit Number 4    Number of Visits 10    Date for PT Re-Evaluation 05/10/21    Authorization Type [employer]    Progress Note Due on Visit 10    PT Start Time 1002    PT Stop Time 1055    PT Time Calculation (min) 53 min    Equipment Utilized During Treatment Other (comment)   S prn   Activity Tolerance Patient tolerated treatment well    Behavior During Therapy [facility] for tasks assessed/performed             Past Medical History:  Diagnosis Date   Acne rosacea    Anxiety    Asthma    Atrial fibrillation and flutter ([place]) 09/2014   Revereted from Afib RVR to 2:1 A Flutter -- TEE/ DCCV 2/9; on Amiodarone   Chronic combined systolic and diastolic CHF, NYHA class 1 ([place]) -- Systolic dysfunction [id number][Q22.97] 09/2014   Exacerbation 09/2014 2/2 Afib/flutter with RVR - 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 ([place]) 09/2014   a) [place] Echo: EF ~10% Severe LV dilation & global LV Systolic dysfxn, restrictive filling pattern - Gr 3 DD, mod RV dysfxn, severe LA dilation - 6.4 cm, mod RA dil, mod pl effusion, mod MR, mild TR; b) TEE 10/07/14: EF ~10%, Severe LV dilation & dysfxn Mod RV dysfxn, LAA oversewn, Mod RA & TR   Diverticulosis    H/O: rheumatic fever    Childhood   HYPERLIPIDEMIA 04/05/2010   Qualifier: Diagnosis of  By: [name] MD, [place]      Hypothyroid    On replacement therapy; normal TSH 09/2014   Migraine headache    Osteopenia    Paroxysmal atrial fibrillation ([place]) 2005; Recurrent 09/2014   a) 2005: s/p Cox Maze & LAA ligation; b) 09/2014: TEE-cardioversion   Rheumatic mitral and aortic valve insufficiency 2005   a) s/p MV Ring repair; with Cox-Maze for Afib; b) Echo 2013: EF 60-65%,no Regional WMA, Gr 2 DD, no Sig MR ;; c) TEE 10/07/2014: Severlely thickened and calcified MV leaflets.  Posterior MV leaflet is fixed and immobile.  MAC.  reduced excursion of the anterior MV leaflet.  Mean MV gradient was 11m Hg and MVA calculated at cm2.      Urinary incontinence     Past Surgical History:  Procedure Laterality Date   ABDOMINAL HYSTERECTOMY     CARDIAC CATHETERIZATION  Jan 2005   Pre-op R&LHC -- Nonobstructive CAD; normal PA pressures   CARDIOVERSION  06/04/2012   Procedure: CARDIOVERSION;  Surgeon: JCarlena Bjornstad MD;  [place] [facility]  Service: Cardiovascular;  Laterality: N/A;   CARDIOVERSION N/A 10/07/2014   Procedure: CARDIOVERSION;  Surgeon: TSueanne Margarita MD;  Location[place] [facility]  Service: Cardiovascular;  Laterality: N/A;   COLONOSCOPY WITH PROPOFOL N/A 07/11/2017   Procedure: COLONOSCOPY WITH PROPOFOL;  Surgeon: [place], Benay Pike, MD;  Location: [facility] ENDOSCOPY;  Service: Gastroenterology;  Laterality: N/A;   LUMBAR DISC SURGERY     x 2 distantly   MITRAL VALVE REPAIR  2005   with [facility] Maze for [employer]   NM MYOVIEW LTD  4/7/'16   EF 37% with septal hypokinesis and diffuse hypokinesis. No ischemia or infarction. 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   RIGHT HEART CATHETERIZATION N/A 10/03/2014   Procedure: RIGHT HEART CATH;  Surgeon: Jolaine Artist, MD;  Location: [facility] CATH LAB;  Service: Cardiovascular;  RAP 37mHg, RVP 35/2/9 mmHg, PAP 45/12 mmHg, PCWP 16 mmHg; CO/I by Fick: 3.9/2.3; Ao/PA/SVC SaO2%: 97%/63%/65%.   TEE WITHOUT CARDIOVERSION N/A  10/07/2014   Procedure: TRANSESOPHAGEAL ECHOCARDIOGRAM (TEE);  Surgeon: TSueanne Margarita MD;  [place] [facility]  Service: Cardiovascular;  Laterality: N/A;   THYROIDECTOMY, PARTIAL     partial-no cancer; now on thyroid replacement   TRANSTHORACIC ECHOCARDIOGRAM  2013; 2/'16 & 5/'16   a) 2013: EF 60-65%, mild MR, Gr 2 DD; b) 2/'16 @ [facility] EF ~10% - severel global LV dysfunction (systolic & diastolic) - dilated LV & restrictive filling pattern - Gr 3 DD, mod reduced RV function, severely dilated LA - 6.4 cm, mod dilated RA, mod pl effusion, mod MR, mild TR; c) 5/10/'16:  EF 25-30%, mod LV dilation, no RWMA,Gr 3 DD w/ high LAP, MV sewing ring intact w/ Mod MR, Severe LA dilation   TRANSTHORACIC ECHOCARDIOGRAM  January 2017:   EF improved to 45-50%. Mild diffuse HK. Mild MR. Moderate LA dilation. Normal PA pressures.    There were no vitals filed for this visit.   Subjective Assessment - 03/10/21 1005     Subjective Pt denied falls or changes in medicine. Pt stated she has a UTI, MD is not aware. She sees MD tomorrow and will ask about cranberry juice/supplements for frequency UTIs. Pt states leakage has improved by approx. 25% since starting PT.    Pertinent History Rheumatic mitral valve, hysterectomy, a-fib, CHF, Dilated cardiomyopathy 2/2 to tachy,  HTN, hypothyroid, GERD, mild dementia, B OA knees, fx of L pelvis (years ago about 10), vaginal dryness-pain with intercourse but no longer coconut oil, HLD, asthma-pt denied, allergies, depression, diverticulosis, h/o reheumatic fever, migraines, osteopenia  Back pain, SUI 3/22, painful intercourse    Patient Stated Goals I would love it if I didn't leak.    Currently in Pain? No/denies                   NMR: Access Code: [email or web address]: [email or web address] Date: 03/10/2021 Prepared by: JGeoffry Paradise Exercises Supine Cervical Retraction with Towel - 1 x daily - 7 x weekly - 1 sets - 5 reps - 5 hold Supine  Diaphragmatic Breathing - 1-2 x daily - 7 x weekly - 1 sets - 5 reps Supine Pelvic Floor Contraction - 1 x daily - 7 x weekly - 3 sets - 10 reps (with R and L hip abd-knee out/in), and 5 second holds x5 reps. At counter with 1-2 hand support: Ant/lat/post stepping strategy with exhale to SLS x5 reps/LE/direction. R and L SLS x30 sec. Hold with glute activation, then amb. For 200' with improvement in lateral trunk lean, arm swing, speed, and stride length and trunk rotation.  Extensive cues to improve breathing with PF contraction, pt had to attempt hip abd, then cease, try without hip abd and then add it back in 2/ 2  difficulty coordinating movement. PT provided cues and demo for each activity. All activities performed with S for safety.                   SELF CARE:  PT Education - 03/10/21 1335     Education Details PT reviewed pelvic floor contraction HEP and progressed as tolerated. PT educated pt on proper toileting posture to reduce strain during bowel movement and to relax pelvic floor, and to fully empty bladder. Pt reviewed educated pt on exhaling during sit to stand txf to reduce leakage and improve balance with pelvic floor contraction, pt states she's unable to contraction pelvic floor 2/2 knee pain but will try next week after cortison injections.    Person(s) Educated Patient    Methods Explanation;Demonstration;Tactile cues;Verbal cues;Handout    Comprehension Returned demonstration;Verbalized understanding;Need further instruction              PT Short Term Goals - 02/09/21 1214       PT SHORT TERM GOAL #1   Title Pt will be IND in HEP to improve leakage, posture, and strength. TARGET DATE FOR ALL STGS: 03/09/21    Time 4    Period Weeks    Status New      PT SHORT TERM GOAL #2   Title Pt will report using < 4 pads/day 2/2 incontinence to improve QOL and leakage.    Baseline 4-8 pads/day.    Time 4    Period Weeks    Status New      PT SHORT TERM  GOAL #3   Title Pt will improve B SLS to 10 sec. on each side in order to improve safety during amb.    Time 4    Period Weeks    Status New               PT Long Term Goals - 02/09/21 1216       PT LONG TERM GOAL #1   Title Pt will be IND in progressed HEP to improve leakage, posture, strength and balance. TARGET DATE FOR ALL LTGS: 04/20/21    Time 10    Period Weeks    Status New      PT LONG TERM GOAL #2   Title Pt will improve FOTO (urinary problem) score from 34 to 50 for improve QOL.    Baseline 34    Time 10    Period Weeks    Status New      PT LONG TERM GOAL #3   Title Complete gait, strength and mobility assessment and write goals as indicated.    Time 10    Period Weeks    Status New      PT LONG TERM GOAL #4   Title Pt will report no falls over the last 4 weeks in order to improve safety, especially when trying to rush to the bathroom.    Baseline 6 falls in 6 months (approx.)    Time 10    Period Weeks    Status New                   Plan - 03/10/21 1339     Clinical Impression Statement Pt demonstrated progress as she tolerated holding pelvic floor endurance contraction for five seconds vs. 2-3 seconds. Pt required extensive verbal cues and demo for progressed pelvic floor contraction with hip abd (R knee out/in and then L knee out/in). However, pt progressed to performing correctly with  exhale. PT initated balance training with breath to improve B SLS during gait and ADLs, and will progress to pelvic floor contraction next session and add to HEP. Pt's gait deviations improved after balance activities. Pt's B ASIS and medial malleolus were even during assessment today.Pt would continue to benefit from skilled PT to improve leakage, strength and balance during all ADLs.    Comorbidities Rheumatic mitral valve, hysterectomy, a-fib, CHF, Dilated cardiomyopathy 2/2 to tachy,  HTN, hypothyroid, GERD, mild dementia, B OA knees, fx of L pelvis (years ago  about 10), vaginal dryness-pain with intercourse but no longer coconut oil, HLD, asthma-pt denied, allergies, depression, diverticulosis, h/o reheumatic fever, migraines, osteopenia  Back pain, SUI 3/22, painful intercourse    PT Treatment/Interventions ADLs/Self Care Home Management;Biofeedback;Functional mobility training;Stair training;Gait training;DME Instruction;Therapeutic activities;Therapeutic exercise;Neuromuscular re-education;Balance training;Patient/family education;Manual techniques;Dry needling;Spinal Manipulations;Joint Manipulations    PT Next Visit Plan Assess STGs. STS txf with PF contraction, SLS balance with PF contraction and breath (add to HEP), sacrum/coccyx mobility, assess for hip alignment and treat prn, review HEP prn.    PT Home Exercise Plan Medbridge: [email or web address]    Consulted and Agree with Plan of Care Patient             Patient will benefit from skilled therapeutic intervention in order to improve the following deficits and impairments:  Abnormal gait, Decreased balance, Decreased endurance, Decreased mobility, Hypomobility, Decreased coordination, Decreased knowledge of use of DME, Decreased strength, Impaired flexibility, Postural dysfunction, Other (comment), Pain  Visit Diagnosis: Mixed incontinence  Muscle weakness (generalized)  Other lack of coordination  Other abnormalities of gait and mobility  Abnormal posture     Problem List Patient Active Problem List   Diagnosis Date Noted   Essential hypertension 07/12/2018   Primary osteoarthritis of both knees 10/12/2016   Fracture of left pelvis ([place]) 04/29/2015   PAF (paroxysmal atrial fibrillation) ([place]) 03/04/2015   On amiodarone therapy 11/14/2014   Current use of long term anticoagulation 11/14/2014   Mild dementia (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 [place], [place], [address] Phone: [phone]   Fax:  [phone]  Name: [name] MRN: [id number] Date of Birth: 12/18/1945   Geoffry Paradise, PT,DPT 03/10/21 1:47 PM Phone: [phone] Fax: [phone]

## 2021-03-10 NOTE — Patient Instructions (Signed)
Toileting posture:  1) Peeing posture: feet flat, lean forward and forearms on legs to relax pelvic floor.  2) Pooping posture: feet up on squatty potty (or stool so knees are higher than hips), lean forward to relax pelvic floor.  

## 2021-03-11 ENCOUNTER — Encounter: Payer: Self-pay | Admitting: Family Medicine

## 2021-03-11 ENCOUNTER — Other Ambulatory Visit: Payer: Self-pay

## 2021-03-11 ENCOUNTER — Ambulatory Visit (INDEPENDENT_AMBULATORY_CARE_PROVIDER_SITE_OTHER): Payer: Medicare HMO | Admitting: Family Medicine

## 2021-03-11 VITALS — BP 90/60 | HR 53 | Temp 97.5°F | Ht 67.5 in | Wt 166.5 lb

## 2021-03-11 DIAGNOSIS — M170 Bilateral primary osteoarthritis of knee: Secondary | ICD-10-CM | POA: Diagnosis not present

## 2021-03-11 MED ORDER — TRIAMCINOLONE ACETONIDE 40 MG/ML IJ SUSP
40.0000 mg | Freq: Once | INTRAMUSCULAR | Status: AC
  Administered 2021-03-11: 40 mg via INTRA_ARTICULAR

## 2021-03-15 ENCOUNTER — Ambulatory Visit: Payer: Medicare HMO

## 2021-03-22 ENCOUNTER — Telehealth: Payer: Self-pay | Admitting: Nurse Practitioner

## 2021-03-22 ENCOUNTER — Ambulatory Visit (INDEPENDENT_AMBULATORY_CARE_PROVIDER_SITE_OTHER): Payer: Medicare HMO | Admitting: Nurse Practitioner

## 2021-03-22 ENCOUNTER — Encounter: Payer: Self-pay | Admitting: Nurse Practitioner

## 2021-03-22 ENCOUNTER — Other Ambulatory Visit: Payer: Self-pay

## 2021-03-22 VITALS — BP 114/62 | HR 67 | Temp 96.8°F | Resp 20 | Ht 67.5 in | Wt 165.5 lb

## 2021-03-22 DIAGNOSIS — L03116 Cellulitis of left lower limb: Secondary | ICD-10-CM | POA: Insufficient documentation

## 2021-03-22 DIAGNOSIS — Z7901 Long term (current) use of anticoagulants: Secondary | ICD-10-CM

## 2021-03-22 MED ORDER — DOXYCYCLINE HYCLATE 100 MG PO TABS
100.0000 mg | ORAL_TABLET | Freq: Two times a day (BID) | ORAL | 0 refills | Status: AC
Start: 2021-03-22 — End: 2021-03-29

## 2021-03-22 NOTE — Assessment & Plan Note (Signed)
Start treatment with doxycycline antibiotic for 7 days.  Patient aware of therapy.  Did discuss since she is on warfarin anticoagulation that the antibiotic can cause a hypercoagulable state increasing her INR and Coumadin levels.  She has an appointment with the INR clinic on 03-24-2021.  We will reach out with her PCP and consulting cardiologist to make sure we do not need closer monitoring while on the antibiotic therapy.  Myself or staff will reach out to patient once clarified about her INR check.  Did trace the redness in office asked patient to monitor for increasing redness.  Also reviewed signs and symptoms of hypocoagulable state including blood in stool, blood in urine, and blood at her gums.  She acknowledged education.

## 2021-03-22 NOTE — Telephone Encounter (Addendum)
Thank you for reaching out.  I do not usually recommend dose reduction of warfarin with co-administration of doxycyline. I am comfortable waiting until 03/24/21 to check her INR.   However, will defer to CVD  anticoagulation clinic sine they are managing this patient.   Debbora Dus, PharmD Clinical Pharmacist [facility] [phone]

## 2021-03-22 NOTE — Patient Instructions (Signed)
I have sent your antibiotics over to the pharmacy Watch for any bleeding more than usual while on antibiotics. Blood in stool, urine, or mouth should be reported. I will call about your INR check and if the date needs to be changed Follow up as scheduled or if symptoms fail to improve  Monitor for redness that is spreading, fever, or chills  Thanks for allowing me to help in your health care journey

## 2021-03-22 NOTE — Progress Notes (Signed)
Acute Office Visit  Subjective:    Patient ID: [name], female    DOB: December 25, 1945, 75 y.o.   MRN: [id number]  Chief Complaint  Patient presents with   Insect Bite    Happened on 03/20/21, several bumps/bites on the lower left leg. Redness and swelling has spread. Has felt hot to the touch and thrubs sensation present.     HPI Patient is in today for insect bite and subsequent rash to left lower extremity.  States did not notice or feel a sting but subsequently noticed bumps and rash appearing that has progressively got worse over the past few days.  Patient denies fever, chills, nausea, or vomiting.  Associated symptoms include redness, warmth and edema.  She was concerned because she has an event to attend on Saturday. Patient does use warfarin for anticoagulation.  Her next INR check is scheduled for 03-24-2021.   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 ([place]) 09/2014   a) [place] Echo: EF ~10% Severe LV dilation & global LV Systolic dysfxn, restrictive filling pattern - Gr 3 DD, mod RV dysfxn, severe LA dilation - 6.4 cm, mod RA dil, mod pl effusion, mod MR, mild TR; b) TEE 10/07/14: EF ~10%, Severe LV dilation & dysfxn Mod RV dysfxn, LAA oversewn, Mod RA & TR   Diverticulosis    H/O: rheumatic fever    Childhood   HYPERLIPIDEMIA 04/05/2010   Qualifier: Diagnosis of   By: [name] MD, Spencer     Hypothyroid    On replacement therapy; normal TSH 09/2014   Migraine headache    Osteopenia    Paroxysmal atrial fibrillation ([place]) 2005; Recurrent 09/2014   a) 2005: s/p [facility] Maze & LAA ligation; b) 09/2014: TEE-cardioversion   Rheumatic mitral and aortic valve insufficiency 2005   a) s/p MV Ring repair; with Cox-Maze for Afib; b) Echo 2013: EF 60-65%,no Regional WMA, Gr 2 DD, no Sig MR ;; c) TEE 10/07/2014: Severlely thickened and calcified MV leaflets.  Posterior MV leaflet is fixed and immobile.  MAC.  reduced excursion of the anterior MV leaflet.  Mean MV gradient was 41m Hg and MVA calculated at cm2.      Urinary incontinence     Past Surgical History:  Procedure Laterality Date   ABDOMINAL HYSTERECTOMY     CARDIAC CATHETERIZATION  Jan 2005   Pre-op R&LHC -- Nonobstructive CAD; normal PA pressures   CARDIOVERSION  06/04/2012   Procedure: CARDIOVERSION;  Surgeon: JCarlena Bjornstad MD;  [facility]  Service: Cardiovascular;  Laterality: N/A;   CARDIOVERSION N/A 10/07/2014   Procedure: CARDIOVERSION;  Surgeon: TSueanne Margarita MD;  [facility]  Service: Cardiovascular;  Laterality: N/A;   COLONOSCOPY WITH PROPOFOL N/A 07/11/2017   Procedure: COLONOSCOPY WITH PROPOFOL;  Surgeon: [place], TBenay Pike MD;  Location: [facility] ENDOSCOPY;  Service: Gastroenterology;  Laterality: N/A;   LUMBAR DISC SURGERY     x 2 distantly   MITRAL VALVE REPAIR  2005   with [facility] for [employer]   NM MYOVIEW LTD  4/7/'16   EF 37% with septal hypokinesis and diffuse hypokinesis. No ischemia or infarction. 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   RIGHT HEART CATHETERIZATION N/A 10/03/2014   Procedure: RIGHT HEART CATH;  Surgeon: Jolaine Artist, MD;  Location: [facility] CATH LAB;  Service: Cardiovascular;  RAP 93mHg, RVP 35/2/9 mmHg, PAP 45/12 mmHg, PCWP 16 mmHg; CO/I by Fick: 3.9/2.3; Ao/PA/SVC SaO2%: 97%/63%/65%.   TEE  WITHOUT CARDIOVERSION N/A 10/07/2014   Procedure: TRANSESOPHAGEAL ECHOCARDIOGRAM (TEE);  Surgeon: TSueanne Margarita MD;  [facility] [facility]  Service: Cardiovascular;  Laterality: N/A;   THYROIDECTOMY, PARTIAL     partial-no cancer; now on thyroid replacement   TRANSTHORACIC ECHOCARDIOGRAM  2013; 2/'16 & 5/'16   a) 2013: EF 60-65%, mild MR, Gr 2 DD; b) [place] ~10% - severel global LV dysfunction (systolic & diastolic) - dilated LV & restrictive filling pattern - Gr 3 DD, mod reduced RV function, severely dilated LA - 6.4 cm, mod dilated RA, mod pl effusion, mod MR, mild TR; c) 5/10/'16:  EF 25-30%, mod LV dilation, no RWMA,Gr 3 DD w/ high LAP, MV sewing ring intact w/ Mod MR, Severe LA dilation   TRANSTHORACIC ECHOCARDIOGRAM  January 2017:   EF improved to 45-50%. Mild diffuse HK. Mild MR. Moderate LA dilation. Normal PA pressures.    Family History  Problem Relation Age of Onset   Diabetes Mother    Coronary artery disease Mother    Kidney disease Mother    Breast cancer Neg Hx     Social History   Socioeconomic History   Marital status: Married    Spouse name: Not on file   Number of children: 2   Years of education: Not on file   Highest education level: Not on file  Occupational History   Occupation: Retired  Tobacco Use   Smoking status: Never   Smokeless tobacco: Never  Vaping Use   Vaping Use: Never used  Substance and Sexual Activity   Alcohol use: Not Currently   Drug use: No   Sexual activity: Yes  Other Topics Concern   Not on file  Social History Narrative   Not on file   Social Determinants of Health   Financial Resource Strain: Low Risk    Difficulty of Paying Living Expenses: Not hard at all  Food Insecurity: No Food Insecurity   Worried About RCharity fundraiserin the Last Year: Never true   RHome Gardensin the Last Year: Never true  Transportation Needs: No Transportation Needs   Lack of Transportation (Medical): No   Lack of  Transportation (Non-Medical): No  Physical Activity: Inactive   Days of Exercise per Week: 0 days   Minutes of Exercise per Session: 0 min  Stress: No Stress Concern Present   Feeling of Stress : Not at all  Social Connections: Not on file  Intimate Partner Violence: Not At Risk   Fear of Current or Ex-Partner: No   Emotionally Abused: No   Physically Abused: No   Sexually Abused: No    Outpatient Medications Prior to Visit  Medication Sig Dispense Refill   amiodarone (PACERONE) 200 MG tablet Take 1 tablet (200 mg total) by mouth daily. Please schedule office  visit for further refills. Thank you! 30 tablet 11   b complex vitamins tablet Take 1 tablet daily by mouth.     Calcium Citrate-Vitamin D (CALCIUM + D PO) Take 1 capsule daily by mouth.      carvedilol (COREG) 3.125 MG tablet TAKE 1 TABLET BY MOUTH TWICE A DAY 180 tablet 3   donepezil (ARICEPT) 5 MG tablet Take 1 tablet (5 mg total) by mouth at bedtime. 90 tablet 3   fluticasone (FLONASE) 50 MCG/ACT nasal spray PLACE TWO SPRAYS INTO BOTH NOSTRILS DAILY 48 g 1   IRON, FERROUS SULFATE, PO Take 1 tablet by mouth daily.     levothyroxine (SYNTHROID) 50 MCG tablet TAKE 1 TAB BY MOUTH ONCE DAILY. TAKE ON AN EMPTY STOMACH WITH A GLASS OF WATER ATLEAST 30-60 MINUTES BEFORE BREAKFAST 90 tablet 3   losartan (COZAAR) 25 MG tablet Take 1 tablet (25 mg total) by mouth daily. 90 tablet 3   metroNIDAZOLE (METROCREAM) 0.75 % cream Apply topically 2 (two) times daily. 45 g 5   Omega-3 Fatty Acids (FISH OIL PO) Take 1 capsule by mouth daily.     permethrin (ELIMITE) 5 % cream APPLY PEA SIZED AMOUNT TO FACE ONCE A DAY  1   traZODone (DESYREL) 50 MG tablet TAKE 1/2 TO 1 TABLET BY MOUTH AT BEDTIME AS NEEDED FOR SLEEP 30 tablet 5   warfarin (COUMADIN) 5 MG tablet TAKE 1/2 TO 1 TABLET BY MOUTH DAILY AS DIRECTED BY COUMADIN CLINIC 90 tablet 1   No facility-administered medications prior to visit.    No Known Allergies  Review of Systems   Constitutional:  Negative for chills and fever.  Cardiovascular:  Positive for leg swelling.  Gastrointestinal:  Negative for nausea and vomiting.  Skin:  Positive for color change.  Hematological:  Bruises/bleeds easily.      Objective:    Physical Exam Vitals and nursing note reviewed.  Constitutional:      Appearance: Normal appearance.  Cardiovascular:     Rate and Rhythm: Normal rate and regular rhythm.     Heart sounds: Normal heart sounds.  Pulmonary:     Effort: Pulmonary effort is normal.     Breath sounds: Normal breath sounds.  Musculoskeletal:     Left lower leg: Edema present.     Comments: Localized swelling  Skin:    General: Skin is warm.     Findings: Erythema, lesion and rash present. No abscess or ecchymosis. Rash is macular.          Comments: 5in x 6.5 in patch or erythema/macular rash. Does had "bite" to left lower posteromedial ankle area  Neurological:     Mental Status: She is alert.    BP 114/62   Pulse 67   Temp (!) 96.8 F (36 C)   Resp 20   Ht 5' 7.5" (1.715 m)   Wt 165 lb 8 oz (75.1 kg)   SpO2 97%   BMI 25.54 kg/m  Wt Readings from Last 3 Encounters:  03/22/21 165 lb 8 oz (75.1 kg)  03/11/21 166 lb 8 oz (75.5 kg)  12/29/20 161 lb 12 oz (73.4 kg)    Health Maintenance Due  Topic Date Due   COVID-19 Vaccine (4 - Booster for Pfizer series) 09/02/2020    There are no preventive care reminders to display for this patient.   Lab Results  Component Value Date   TSH 6.90 (H) 08/12/2020   Lab Results  Component Value Date   WBC  7.1 08/12/2020   HGB 12.7 08/12/2020   HCT 38.3 08/12/2020   MCV 100.4 (H) 08/12/2020   PLT 198.0 08/12/2020   Lab Results  Component Value Date   NA 140 08/12/2020   K 4.7 08/12/2020   CO2 30 08/12/2020   GLUCOSE 89 08/12/2020   BUN 20 08/12/2020   CREATININE 0.91 08/12/2020   BILITOT 0.5 08/12/2020   ALKPHOS 87 08/12/2020   AST 21 08/12/2020   ALT 25 08/12/2020   PROT 6.3 08/12/2020    ALBUMIN 4.1 08/12/2020   CALCIUM 9.0 08/12/2020   ANIONGAP 8 07/21/2017   GFR 61.55 08/12/2020   Lab Results  Component Value Date   CHOL 175 08/12/2020   Lab Results  Component Value Date   HDL 56.40 08/12/2020   Lab Results  Component Value Date   LDLCALC 84 08/12/2020   Lab Results  Component Value Date   TRIG 173.0 (H) 08/12/2020   Lab Results  Component Value Date   CHOLHDL 3 08/12/2020   Lab Results  Component Value Date   HGBA1C 5.8 08/12/2020       Assessment & Plan:   Problem List Items Addressed This Visit       Other   Current use of long term anticoagulation (Chronic)    Patient currently on warfarin for anticoagulation.  She is monitored by the Coumadin clinic.  Has appointment on 03-24-2021 for INR check.  Discussed with patient that using antibiotics can increase Coumadin levels and blood and lead to excess bleeding.  Reviewed with patient signs and symptoms to watch out for be aware.  We will contact her once I have gotten in touch with PCP and consultant to see if additional INR monitoring is required.       Cellulitis of left lower extremity - Primary    Start treatment with doxycycline antibiotic for 7 days.  Patient aware of therapy.  Did discuss since she is on warfarin anticoagulation that the antibiotic can cause a hypercoagulable state increasing her INR and Coumadin levels.  She has an appointment with the INR clinic on 03-24-2021.  We will reach out with her PCP and consulting cardiologist to make sure we do not need closer monitoring while on the antibiotic therapy.  Myself or staff will reach out to patient once clarified about her INR check.  Did trace the redness in office asked patient to monitor for increasing redness.  Also reviewed signs and symptoms of hypocoagulable state including blood in stool, blood in urine, and blood at her gums.  She acknowledged education.       Relevant Medications   doxycycline (VIBRA-TABS) 100 MG tablet    Will call patient with updated INR lab visit scheduled. Follow-up as scheduled or if symptoms fail to improve/worsen.   No orders of the defined types were placed in this encounter.    Romilda Garret, NP

## 2021-03-22 NOTE — Assessment & Plan Note (Signed)
Patient currently on warfarin for anticoagulation.  She is monitored by the Coumadin clinic.  Has appointment on 03-24-2021 for INR check.  Discussed with patient that using antibiotics can increase Coumadin levels and blood and lead to excess bleeding.  Reviewed with patient signs and symptoms to watch out for be aware.  We will contact her once I have gotten in touch with PCP and consultant to see if additional INR monitoring is required.

## 2021-03-23 ENCOUNTER — Telehealth: Payer: Self-pay | Admitting: Nurse Practitioner

## 2021-03-23 NOTE — Telephone Encounter (Signed)
Call and left a message for [name]. Messaged informed her to keep her INR appointment on 03/24/2021 and pending that result the clinic will give her further instructions

## 2021-03-24 ENCOUNTER — Other Ambulatory Visit: Payer: Self-pay

## 2021-03-24 ENCOUNTER — Ambulatory Visit (INDEPENDENT_AMBULATORY_CARE_PROVIDER_SITE_OTHER): Payer: Medicare HMO

## 2021-03-24 ENCOUNTER — Ambulatory Visit: Payer: Medicare HMO

## 2021-03-24 DIAGNOSIS — Z7901 Long term (current) use of anticoagulants: Secondary | ICD-10-CM | POA: Diagnosis not present

## 2021-03-24 DIAGNOSIS — R293 Abnormal posture: Secondary | ICD-10-CM

## 2021-03-24 DIAGNOSIS — N3946 Mixed incontinence: Secondary | ICD-10-CM

## 2021-03-24 DIAGNOSIS — I4891 Unspecified atrial fibrillation: Secondary | ICD-10-CM

## 2021-03-24 DIAGNOSIS — Z5181 Encounter for therapeutic drug level monitoring: Secondary | ICD-10-CM | POA: Diagnosis not present

## 2021-03-24 DIAGNOSIS — R278 Other lack of coordination: Secondary | ICD-10-CM

## 2021-03-24 DIAGNOSIS — M6281 Muscle weakness (generalized): Secondary | ICD-10-CM

## 2021-03-24 DIAGNOSIS — R2689 Other abnormalities of gait and mobility: Secondary | ICD-10-CM

## 2021-03-24 LAB — POCT INR: INR: 3 (ref 2.0–3.0)

## 2021-03-24 NOTE — Patient Instructions (Addendum)
-   take 1/2 tablet a day for the rest of the week,  then - on Saturday resume dosage of warfarin 1 tablet daily EXCEPT 1/2 tablet on [place]. - recheck in 6 weeks.

## 2021-03-24 NOTE — Therapy (Signed)
[place] MAIN [facility] SERVICES [address] [place], [place], [address] Phone: [phone]   Fax:  [phone]  Physical Therapy Treatment  Patient Details  Name: [name] MRN: [id number] Date of Birth: Feb 02, 1946 Referring Provider (PT): Waunita Schooner, MD   Encounter Date: 03/24/2021   PT End of Session - 03/24/21 1023     Visit Number 5    Number of Visits 10    Date for PT Re-Evaluation 05/10/21    Authorization Type [employer]    Progress Note Due on Visit 10    PT Start Time 1005    PT Stop Time 1058    PT Time Calculation (min) 53 min    Equipment Utilized During Treatment Other (comment)   S for safety   Activity Tolerance Patient tolerated treatment well    Behavior During Therapy [facility] for tasks assessed/performed             Past Medical History:  Diagnosis Date   Acne rosacea    Anxiety    Asthma    Atrial fibrillation and flutter ([place]) 09/2014   Revereted from Afib RVR to 2:1 A Flutter -- TEE/ DCCV 2/9; on Amiodarone   Chronic combined systolic and diastolic CHF, NYHA class 1 ([place]) -- Systolic dysfunction [id number][B09.62] 09/2014   Exacerbation 09/2014 2/2 Afib/flutter with RVR - 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 ([place]) 09/2014   a) [place] Echo: EF ~10% Severe LV dilation & global LV Systolic dysfxn, restrictive filling pattern - Gr 3 DD, mod RV dysfxn, severe LA dilation - 6.4 cm, mod RA dil, mod pl effusion, mod MR, mild TR; b) TEE 10/07/14: EF ~10%, Severe LV dilation & dysfxn Mod RV dysfxn, LAA oversewn, Mod RA & TR   Diverticulosis    H/O: rheumatic fever    Childhood   HYPERLIPIDEMIA 04/05/2010   Qualifier: Diagnosis of  By: [name] MD, Spencer      Hypothyroid    On replacement therapy; normal TSH 09/2014   Migraine headache    Osteopenia    Paroxysmal atrial fibrillation ([place]) 2005; Recurrent 09/2014   a) 2005: s/p Cox Maze & LAA ligation; b) 09/2014: TEE-cardioversion   Rheumatic mitral and aortic valve insufficiency 2005   a) s/p MV Ring repair; with Cox-Maze for Afib; b) Echo 2013: EF 60-65%,no Regional WMA, Gr 2 DD, no Sig MR ;; c) TEE 10/07/2014: Severlely thickened and calcified MV leaflets.  Posterior MV leaflet is fixed and immobile.  MAC.  reduced excursion of the anterior MV leaflet.  Mean MV gradient was 35m Hg and MVA calculated at cm2.      Urinary incontinence     Past Surgical History:  Procedure Laterality Date   ABDOMINAL HYSTERECTOMY     CARDIAC CATHETERIZATION  Jan 2005   Pre-op R&LHC -- Nonobstructive CAD; normal PA pressures   CARDIOVERSION  06/04/2012   Procedure: CARDIOVERSION;  Surgeon: JCarlena Bjornstad MD;  [facility]  Service: Cardiovascular;  Laterality: N/A;   CARDIOVERSION N/A 10/07/2014   Procedure: CARDIOVERSION;  Surgeon: TSueanne Margarita MD;  [facility]  Service: Cardiovascular;  Laterality: N/A;   COLONOSCOPY WITH PROPOFOL N/A 07/11/2017   Procedure: COLONOSCOPY WITH PROPOFOL;  Surgeon: Toledo, Benay Pike, MD;  Location: [facility] ENDOSCOPY;  Service: Gastroenterology;  Laterality: N/A;   LUMBAR DISC SURGERY     x 2 distantly   MITRAL VALVE REPAIR  2005   with [facility] Maze for [employer]   NM MYOVIEW LTD  4/7/'16   EF 37% with septal hypokinesis and diffuse hypokinesis. No ischemia or infarction. 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   RIGHT HEART CATHETERIZATION N/A 10/03/2014   Procedure: RIGHT HEART CATH;  Surgeon: Jolaine Artist, MD;  Location: [facility] CATH LAB;  Service: Cardiovascular;  RAP 8mHg, RVP 35/2/9 mmHg, PAP 45/12 mmHg, PCWP 16 mmHg; CO/I by Fick: 3.9/2.3; Ao/PA/SVC SaO2%: 97%/63%/65%.   TEE WITHOUT CARDIOVERSION N/A  10/07/2014   Procedure: TRANSESOPHAGEAL ECHOCARDIOGRAM (TEE);  Surgeon: TSueanne Margarita MD;  [facility]  Service: Cardiovascular;  Laterality: N/A;   THYROIDECTOMY, PARTIAL     partial-no cancer; now on thyroid replacement   TRANSTHORACIC ECHOCARDIOGRAM  2013; 2/'16 & 5/'16   a) 2013: EF 60-65%, mild MR, Gr 2 DD; b) 2/'16 @ [facility] EF ~10% - severel global LV dysfunction (systolic & diastolic) - dilated LV & restrictive filling pattern - Gr 3 DD, mod reduced RV function, severely dilated LA - 6.4 cm, mod dilated RA, mod pl effusion, mod MR, mild TR; c) 5/10/'16:  EF 25-30%, mod LV dilation, no RWMA,Gr 3 DD w/ high LAP, MV sewing ring intact w/ Mod MR, Severe LA dilation   TRANSTHORACIC ECHOCARDIOGRAM  January 2017:   EF improved to 45-50%. Mild diffuse HK. Mild MR. Moderate LA dilation. Normal PA pressures.    There were no vitals filed for this visit.   Subjective Assessment - 03/24/21 1008     Subjective Pt reported she has LLE cellulitis 2/2 bug bites and saw MD Monday and is on antibiotics. Pt reported UTI is resolved. The leakage is a little better.    Pertinent History Rheumatic mitral valve, hysterectomy, a-fib, CHF, Dilated cardiomyopathy 2/2 to tachy,  HTN, hypothyroid, GERD, mild dementia, B OA knees, fx of L pelvis (years ago about 10), vaginal dryness-pain with intercourse but no longer coconut oil, HLD, asthma-pt denied, allergies, depression, diverticulosis, h/o reheumatic fever, migraines, osteopenia  Back pain, SUI 3/22, painful intercourse    Patient Stated Goals I would love it if I didn't leak.    Currently in Pain? No/denies                Access Code: [email or web address]: [email or web address] Date: 03/24/2021 Prepared by: JGeoffry Paradise Exercises Supine Cervical Retraction with Towel - 1 x daily - 7 x weekly - 1 sets - 5 reps - 5 hold Supine Diaphragmatic Breathing - 1-2 x daily - 7 x weekly - 1 sets - 5 reps Supine Pelvic Floor Contraction  - 1 x daily - 7 x weekly - 3 sets - 10 reps Clams: BLE 3x10 reps in sidelying.  Cues and demo for proper technique.  Mini squats with chair in front of pt and table behind pt x 5 reps prior to B knee pain, attempted mini squat at counter with lateral stepping but B knee pain made it challenging for pt despite BUE support. Ceased. Ant/post pelvic tilts in supine and seated, x30 reps in each direction with manual facilitation and cues and demo for proper technique. Ant/post/lat stepping and then SLS with pelvic floor contraction x5 reps/LE/direction with 1-2 UE support at counter. Cues and demo for technique. Performed with S for  safety.                       PT Education - 03/24/21 1022     Education Details PT discussed goal progress and the importance of performing HEP daily. PT added to HEP as indicated.    Person(s) Educated Patient    Methods Explanation;Verbal cues;Handout    Comprehension Returned demonstration;Verbalized understanding              PT Short Term Goals - 03/24/21 1010       PT SHORT TERM GOAL #1   Title Pt will be IND in HEP to improve leakage, posture, and strength. TARGET DATE FOR ALL STGS: 03/09/21    Time 4    Period Weeks    Status Partially Met      PT SHORT TERM GOAL #2   Title Pt will report using < 4 pads/day 2/2 incontinence to improve QOL and leakage.    Baseline 4-8 pads/day baseline; approx. 5 pads on 03/24/21.    Time 4    Period Weeks    Status Partially Met      PT SHORT TERM GOAL #3   Title Pt will improve B SLS to 10 sec. on each side in order to improve safety during amb.    Baseline 10 sec. on each LE    Time 4    Period Weeks    Status Achieved               PT Long Term Goals - 02/09/21 1216       PT LONG TERM GOAL #1   Title Pt will be IND in progressed HEP to improve leakage, posture, strength and balance. TARGET DATE FOR ALL LTGS: 04/20/21    Time 10    Period Weeks    Status New      PT LONG  TERM GOAL #2   Title Pt will improve FOTO (urinary problem) score from 34 to 50 for improve QOL.    Baseline 34    Time 10    Period Weeks    Status New      PT LONG TERM GOAL #3   Title Complete gait, strength and mobility assessment and write goals as indicated.    Time 10    Period Weeks    Status New      PT LONG TERM GOAL #4   Title Pt will report no falls over the last 4 weeks in order to improve safety, especially when trying to rush to the bathroom.    Baseline 6 falls in 6 months (approx.)    Time 10    Period Weeks    Status New                   Plan - 03/24/21 1246     Clinical Impression Statement Today's skilled session focused on assessing pt's STGs. Pt demonstrated progress as she met STG 3 and partially met STGs 1 and 2 as she still requires minimal cues for proper technique during HEP and has not been performing HEP daily. Pt continues to require cues to exhale during pelvic floor contraction but quickly made progress during session. PT was able to progress pt to high level balance activiteis with pelvic floor activation to improve balance and leakage. Pt would continue to benefit from skilled PT to improve leakage, strength, balance, posture and safety during all ADLs.    Comorbidities Rheumatic mitral valve, hysterectomy,  a-fib, CHF, Dilated cardiomyopathy 2/2 to tachy,  HTN, hypothyroid, GERD, mild dementia, B OA knees, fx of L pelvis (years ago about 10), vaginal dryness-pain with intercourse but no longer coconut oil, HLD, asthma-pt denied, allergies, depression, diverticulosis, h/o reheumatic fever, migraines, osteopenia  Back pain, SUI 3/22, painful intercourse    PT Treatment/Interventions ADLs/Self Care Home Management;Biofeedback;Functional mobility training;Stair training;Gait training;DME Instruction;Therapeutic activities;Therapeutic exercise;Neuromuscular re-education;Balance training;Patient/family education;Manual techniques;Dry needling;Spinal  Manipulations;Joint Manipulations    PT Next Visit Plan STS txf with PF contraction, SLS balance with PF contraction and breath (add to HEP), progress pelvic floor contraction, sacrum/coccyx mobility, assess for hip alignment and treat prn, review HEP prn.    PT Home Exercise Plan Medbridge: [email or web address]    Consulted and Agree with Plan of Care Patient             Patient will benefit from skilled therapeutic intervention in order to improve the following deficits and impairments:  Abnormal gait, Decreased balance, Decreased endurance, Decreased mobility, Hypomobility, Decreased coordination, Decreased knowledge of use of DME, Decreased strength, Impaired flexibility, Postural dysfunction, Other (comment), Pain  Visit Diagnosis: Muscle weakness (generalized)  Mixed incontinence  Other lack of coordination  Other abnormalities of gait and mobility  Abnormal posture     Problem List Patient Active Problem List   Diagnosis Date Noted   Cellulitis of left lower extremity 03/22/2021   Essential hypertension 07/12/2018   Primary osteoarthritis of both knees 10/12/2016   Fracture of left pelvis ([facility]) 04/29/2015   PAF (paroxysmal atrial fibrillation) ([place]) 03/04/2015   On amiodarone therapy 11/14/2014   Current use of long term anticoagulation 11/14/2014   Mild dementia ([place]) 10/24/2014   Chronic combined systolic and diastolic CHF, NYHA class 1 ([place]) 09/29/2014   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, [place], [address] Phone: [phone]   Fax:  [phone]  Name: TARYN SHELLHAMMER MRN: [id number] Date of Birth: 08-Aug-1946   Geoffry Paradise, PT,DPT 03/24/21 12:51 PM Phone: [phone] Fax: [phone]

## 2021-03-26 NOTE — Telephone Encounter (Signed)
Error

## 2021-03-31 ENCOUNTER — Ambulatory Visit: Payer: Medicare HMO | Attending: Family Medicine

## 2021-03-31 ENCOUNTER — Other Ambulatory Visit: Payer: Self-pay

## 2021-03-31 DIAGNOSIS — M6281 Muscle weakness (generalized): Secondary | ICD-10-CM | POA: Diagnosis present

## 2021-03-31 DIAGNOSIS — R293 Abnormal posture: Secondary | ICD-10-CM | POA: Diagnosis present

## 2021-03-31 DIAGNOSIS — R2689 Other abnormalities of gait and mobility: Secondary | ICD-10-CM | POA: Diagnosis present

## 2021-03-31 DIAGNOSIS — R278 Other lack of coordination: Secondary | ICD-10-CM | POA: Diagnosis present

## 2021-03-31 DIAGNOSIS — N3946 Mixed incontinence: Secondary | ICD-10-CM | POA: Insufficient documentation

## 2021-03-31 NOTE — Therapy (Signed)
[place] MAIN [facility] SERVICES [address] [place], [place], [address] Phone: [phone]   Fax:  [phone]  Physical Therapy Treatment  Patient Details  Name: [name] MRN: [id number] Date of Birth: 06-06-1946 Referring Provider (PT): Waunita Schooner, MD   Encounter Date: 03/31/2021   PT End of Session - 03/31/21 1101     Visit Number 6    Number of Visits 10    Date for PT Re-Evaluation 05/10/21    Authorization Type [employer]    Progress Note Due on Visit 10    PT Start Time 1001    PT Stop Time 1054    PT Time Calculation (min) 53 min    Activity Tolerance Patient tolerated treatment well;No increased pain    Behavior During Therapy [facility] for tasks assessed/performed             Past Medical History:  Diagnosis Date   Acne rosacea    Anxiety    Asthma    Atrial fibrillation and flutter ([place]) 09/2014   Revereted from Afib RVR to 2:1 A Flutter -- TEE/ DCCV 2/9; on Amiodarone   Chronic combined systolic and diastolic CHF, NYHA class 1 (HCC) -- Systolic dysfunction [id number][U13.24] 09/2014   Exacerbation 09/2014 2/2 Afib/flutter with RVR - 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 ([place]) 09/2014   a) [place] Echo: EF ~10% Severe LV dilation & global LV Systolic dysfxn, restrictive filling pattern - Gr 3 DD, mod RV dysfxn, severe LA dilation - 6.4 cm, mod RA dil, mod pl effusion, mod MR, mild TR; b) TEE 10/07/14: EF ~10%, Severe LV dilation & dysfxn Mod RV dysfxn, LAA oversewn, Mod RA & TR   Diverticulosis    H/O: rheumatic fever    Childhood   HYPERLIPIDEMIA 04/05/2010   Qualifier: Diagnosis of  By: [name] MD, [place]     Hypothyroid    On replacement therapy; normal TSH  09/2014   Migraine headache    Osteopenia    Paroxysmal atrial fibrillation ([place]) 2005; Recurrent 09/2014   a) 2005: s/p Cox Maze & LAA ligation; b) 09/2014: TEE-cardioversion   Rheumatic mitral and aortic valve insufficiency 2005   a) s/p MV Ring repair; with Cox-Maze for Afib; b) Echo 2013: EF 60-65%,no Regional WMA, Gr 2 DD, no Sig MR ;; c) TEE 10/07/2014: Severlely thickened and calcified MV leaflets.  Posterior MV leaflet is fixed and immobile.  MAC.  reduced excursion of the anterior MV leaflet.  Mean MV gradient was 26m Hg and MVA calculated at cm2.      Urinary incontinence     Past Surgical History:  Procedure Laterality Date   ABDOMINAL HYSTERECTOMY     CARDIAC CATHETERIZATION  Jan 2005   Pre-op R&LHC -- Nonobstructive CAD; normal PA pressures   CARDIOVERSION  06/04/2012   Procedure: CARDIOVERSION;  Surgeon: JCarlena Bjornstad MD;  [place] [facility]  Service: Cardiovascular;  Laterality: N/A;   CARDIOVERSION N/A 10/07/2014   Procedure: CARDIOVERSION;  Surgeon: TSueanne Margarita MD;  [facility]  Service: Cardiovascular;  Laterality: N/A;   COLONOSCOPY WITH PROPOFOL N/A 07/11/2017   Procedure: COLONOSCOPY WITH PROPOFOL;  Surgeon: [place], TBenay Pike MD;  Location: [facility] ENDOSCOPY;  Service: Gastroenterology;  Laterality: N/A;   LUMBAR DISC SURGERY     x 2 distantly   MITRAL VALVE REPAIR  2005   with [facility] Maze for [employer]   NM MYOVIEW LTD  4/7/'16   EF 37% with septal hypokinesis and diffuse hypokinesis. No ischemia or infarction. 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   RIGHT HEART CATHETERIZATION N/A 10/03/2014   Procedure: RIGHT HEART CATH;  Surgeon: Jolaine Artist, MD;  [facility] LAB;  Service: Cardiovascular;  RAP 12mHg, RVP 35/2/9 mmHg, PAP 45/12 mmHg, PCWP 16 mmHg; CO/I by Fick: 3.9/2.3; Ao/PA/SVC SaO2%: 97%/63%/65%.   TEE WITHOUT CARDIOVERSION N/A 10/07/2014   Procedure: TRANSESOPHAGEAL ECHOCARDIOGRAM  (TEE);  Surgeon: TSueanne Margarita MD;  [place] [facility]  Service: Cardiovascular;  Laterality: N/A;   THYROIDECTOMY, PARTIAL     partial-no cancer; now on thyroid replacement   TRANSTHORACIC ECHOCARDIOGRAM  2013; 2/'16 & 5/'16   a) 2013: EF 60-65%, mild MR, Gr 2 DD; b) [place] ~10% - severel global LV dysfunction (systolic & diastolic) - dilated LV & restrictive filling pattern - Gr 3 DD, mod reduced RV function, severely dilated LA - 6.4 cm, mod dilated RA, mod pl effusion, mod MR, mild TR; c) 5/10/'16:  EF 25-30%, mod LV dilation, no RWMA,Gr 3 DD w/ high LAP, MV sewing ring intact w/ Mod MR, Severe LA dilation   TRANSTHORACIC ECHOCARDIOGRAM  January 2017:   EF improved to 45-50%. Mild diffuse HK. Mild MR. Moderate LA dilation. Normal PA pressures.    There were no vitals filed for this visit.   Subjective Assessment - 03/31/21 1004     Subjective Pt reported her LLE is feeling better and stopped medication for cellulitis. Pt reported she didn't perform HEP as much 2/2 travel over the weekend. She seems to be voiding more when toileting vs. dribble. Pt reported knee injections didn't help pain this time, MD is following pt for knee pain and she will eventually need B TKA.    Pertinent History Rheumatic mitral valve, hysterectomy, a-fib, CHF, Dilated cardiomyopathy 2/2 to tachy,  HTN, hypothyroid, GERD, mild dementia, B OA knees, fx of L pelvis (years ago about 10), vaginal dryness-pain with intercourse but no longer coconut oil, HLD, asthma-pt denied, allergies, depression, diverticulosis, h/o reheumatic fever, migraines, osteopenia  Back pain, SUI 3/22, painful intercourse    Patient Stated Goals I would love it if I didn't leak.    Currently in Pain? No/denies             NMR:  Access Code: [email or web address]: [email or web address] Date: 03/31/2021 Prepared by: JGeoffry Paradise Exercises Review only: Supine Cervical Retraction with Towel - 1 x daily - 7 x  weekly - 1 sets - 5 reps - 5 hold Supine Diaphragmatic Breathing - 1-2 x daily - 7 x weekly - 1 sets - 5 reps Clamshell - 1 x daily - 3 x weekly - 3 sets - 10 reps - 1-2 hold Supine pelvic floor contractoin x 10 reps and x5 reps with 10 sec.hold. Seated Pelvic Floor Contraction - 2 x daily - 7 x weekly - 1 sets - 10 reps - 10 hold Supine Posterior Pelvic Tilt - 1 x daily - 7 x weekly - 1 sets - 10 reps Supine Anterior Pelvic Tilt - 1 x daily - 7 x weekly - 1 sets - 10 reps Modified Thomas Stretch - 2-3 x daily - 7 x weekly -  1 sets - 3 reps - 60 hold  Cues and demo for proper technique and S for safety.                 [place] Adult PT Treatment/Exercise - 03/31/21 1057       Neuro Re-ed    Neuro Re-ed Details  Pt performed STS txfs 2x5 reps with PF contraction to reduce leakage, cues to exhale prior to sit to stand and PF contraction. See HEP for notes.      Manual Therapy   Manual Therapy Soft tissue mobilization;Muscle Energy Technique    Manual therapy comments Pt reported decr. discomfort and pain in B hip flexors and reported it was easier to perform ant. pelvic tilt.    Soft tissue mobilization PT performed massage and trigger point release to B hip flexors along with 3 x30sec. hold supine hip flexor stretch after manual therapy. Pt reported 7/10 discomfort in  R hip flexor and 5/10 in L hip flexor prior to STM and 5/10 in R hip flexor and 4/10 L hip flexor after manual therapy.    Muscle Energy Technique PT had pt perform DKTC with resistance 3x10 sec. holds and then R and L hip flexor stretch to improve pain and ROM.                    PT Education - 03/31/21 1101     Education Details PT progressed and updated HEP.    Person(s) Educated Patient    Methods Explanation;Demonstration;Tactile cues;Verbal cues;Handout    Comprehension Returned demonstration;Verbalized understanding;Need further instruction              PT Short Term Goals - 03/24/21  1010       PT SHORT TERM GOAL #1   Title Pt will be IND in HEP to improve leakage, posture, and strength. TARGET DATE FOR ALL STGS: 03/09/21    Time 4    Period Weeks    Status Partially Met      PT SHORT TERM GOAL #2   Title Pt will report using < 4 pads/day 2/2 incontinence to improve QOL and leakage.    Baseline 4-8 pads/day baseline; approx. 5 pads on 03/24/21.    Time 4    Period Weeks    Status Partially Met      PT SHORT TERM GOAL #3   Title Pt will improve B SLS to 10 sec. on each side in order to improve safety during amb.    Baseline 10 sec. on each LE    Time 4    Period Weeks    Status Achieved               PT Long Term Goals - 02/09/21 1216       PT LONG TERM GOAL #1   Title Pt will be IND in progressed HEP to improve leakage, posture, strength and balance. TARGET DATE FOR ALL LTGS: 04/20/21    Time 10    Period Weeks    Status New      PT LONG TERM GOAL #2   Title Pt will improve FOTO (urinary problem) score from 34 to 50 for improve QOL.    Baseline 34    Time 10    Period Weeks    Status New      PT LONG TERM GOAL #3   Title Complete gait, strength and mobility assessment and write goals as indicated.    Time 10  Period Weeks    Status New      PT LONG TERM GOAL #4   Title Pt will report no falls over the last 4 weeks in order to improve safety, especially when trying to rush to the bathroom.    Baseline 6 falls in 6 months (approx.)    Time 10    Period Weeks    Status New                   Plan - 03/31/21 1102     Clinical Impression Statement Pt demonstrated improved strength as she was able to progress from supine to seated pelvic floor contractions. Pt noted to experience incr. difficulty performing pelvic tilts (ant>post) 2/2 incr. tightness in B hip flexors (R>L) which improved after manual therapy. Pt also reported she has experienced less back pain 2/2 focus on posture and improvements in voiding/bowel movements. Pt  continues to experience leakage, decr. strength, pain, gait deviations, and impaired balance and would continue to benefit from skilled PT to improve safety and all deficits during ADLs.    Comorbidities Rheumatic mitral valve, hysterectomy, a-fib, CHF, Dilated cardiomyopathy 2/2 to tachy,  HTN, hypothyroid, GERD, mild dementia, B OA knees, fx of L pelvis (years ago about 10), vaginal dryness-pain with intercourse but no longer coconut oil, HLD, asthma-pt denied, allergies, depression, diverticulosis, h/o reheumatic fever, migraines, osteopenia  Back pain, SUI 3/22, painful intercourse    PT Treatment/Interventions ADLs/Self Care Home Management;Biofeedback;Functional mobility training;Stair training;Gait training;DME Instruction;Therapeutic activities;Therapeutic exercise;Neuromuscular re-education;Balance training;Patient/family education;Manual techniques;Dry needling;Spinal Manipulations;Joint Manipulations    PT Next Visit Plan STS txf with PF contraction, SLS balance with PF contraction and breath (add to HEP), progress pelvic floor contraction, sacrum/coccyx mobility, assess for hip alignment and treat prn, review HEP prn.    PT Home Exercise Plan Medbridge: [email or web address]    Consulted and Agree with Plan of Care Patient             Patient will benefit from skilled therapeutic intervention in order to improve the following deficits and impairments:  Abnormal gait, Decreased balance, Decreased endurance, Decreased mobility, Hypomobility, Decreased coordination, Decreased knowledge of use of DME, Decreased strength, Impaired flexibility, Postural dysfunction, Other (comment), Pain  Visit Diagnosis: Mixed incontinence  Muscle weakness (generalized)  Other lack of coordination  Other abnormalities of gait and mobility  Abnormal posture     Problem List Patient Active Problem List   Diagnosis Date Noted   Cellulitis of left lower extremity 03/22/2021   Essential hypertension 07/12/2018    Primary osteoarthritis of both knees 10/12/2016   Fracture of left pelvis (HCC) 04/29/2015   PAF (paroxysmal atrial fibrillation) ([place]) 03/04/2015   On amiodarone therapy 11/14/2014   Current use of long term anticoagulation 11/14/2014   Mild dementia ([place]) 10/24/2014   Chronic combined systolic and diastolic CHF, NYHA class 1 ([place]) 09/29/2014   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 ([place]) 09/29/2014   Hyperlipidemia LDL goal <100 04/05/2010   GERD 04/05/2010   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, status post MVR 07/27/2009   Hypothyroid 11/18/2008   Generalized anxiety disorder 11/18/2008   Major depressive disorder, recurrent episode, moderate with anxious distress ([place]) 11/18/2008   ASTHMA 11/18/2008   Mixed incontinence urge and stress 11/18/2008   NEPHROLITHIASIS, HX OF 11/18/2008    Yonna Alwin L 03/31/2021, 11:05 AM  [place], [place], [address] Phone: [phone]   Fax:  [phone]  Name:  KEANDREA TAPLEY MRN: [id number] Date of Birth: April 24, 1946   Geoffry Paradise, PT,DPT 03/31/21 11:06 AM Phone: [phone] Fax: [phone]

## 2021-04-07 ENCOUNTER — Other Ambulatory Visit: Payer: Self-pay

## 2021-04-07 ENCOUNTER — Ambulatory Visit: Payer: Medicare HMO

## 2021-04-07 DIAGNOSIS — R2689 Other abnormalities of gait and mobility: Secondary | ICD-10-CM

## 2021-04-07 DIAGNOSIS — N3946 Mixed incontinence: Secondary | ICD-10-CM | POA: Diagnosis not present

## 2021-04-07 DIAGNOSIS — M6281 Muscle weakness (generalized): Secondary | ICD-10-CM

## 2021-04-07 DIAGNOSIS — R278 Other lack of coordination: Secondary | ICD-10-CM

## 2021-04-07 DIAGNOSIS — R293 Abnormal posture: Secondary | ICD-10-CM

## 2021-04-07 NOTE — Therapy (Signed)
[place] MAIN [facility] SERVICES [address] [place], [place], [address] Phone: [phone]   Fax:  [phone]  Physical Therapy Treatment  Patient Details  Name: [name] MRN: [id number] Date of Birth: [birth year] Referring Provider (PT): Waunita Schooner, MD   Encounter Date: 04/07/2021   PT End of Session - 04/07/21 1109     Visit Number 7    Number of Visits 10    Date for PT Re-Evaluation 05/10/21    Authorization Type [employer]    Progress Note Due on Visit 10    PT Start Time 1003   pt in rest room from 1003-1005   PT Stop Time 1059    PT Time Calculation (min) 56 min    Activity Tolerance Patient tolerated treatment well;No increased pain    Behavior During Therapy [facility] for tasks assessed/performed             Past Medical History:  Diagnosis Date   Acne rosacea    Anxiety    Asthma    Atrial fibrillation and flutter ([place]) 09/2014   Revereted from Afib RVR to 2:1 A Flutter -- TEE/ DCCV 2/9; on Amiodarone   Chronic combined systolic and diastolic CHF, NYHA class 1 (HCC) -- Systolic dysfunction [id number][J57.01] 09/2014   Exacerbation 09/2014 2/2 Afib/flutter with RVR - 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 ([place]) 09/2014   a) [place] Echo: EF ~10% Severe LV dilation & global LV Systolic dysfxn, restrictive filling pattern - Gr 3 DD, mod RV dysfxn, severe LA dilation - 6.4 cm, mod RA dil, mod pl effusion, mod MR, mild TR; b) TEE 10/07/14: EF ~10%, Severe LV dilation & dysfxn Mod RV dysfxn, LAA oversewn, Mod RA & TR   Diverticulosis    H/O: rheumatic fever    Childhood   HYPERLIPIDEMIA 04/05/2010   Qualifier: Diagnosis of  By: [name] MD, Spencer     Hypothyroid     On replacement therapy; normal TSH 09/2014   Migraine headache    Osteopenia    Paroxysmal atrial fibrillation ([place]) 2005; Recurrent 09/2014   a) 2005: s/p Cox Maze & LAA ligation; b) 09/2014: TEE-cardioversion   Rheumatic mitral and aortic valve insufficiency 2005   a) s/p MV Ring repair; with Cox-Maze for Afib; b) Echo 2013: EF 60-65%,no Regional WMA, Gr 2 DD, no Sig MR ;; c) TEE 10/07/2014: Severlely thickened and calcified MV leaflets.  Posterior MV leaflet is fixed and immobile.  MAC.  reduced excursion of the anterior MV leaflet.  Mean MV gradient was 81m Hg and MVA calculated at cm2.      Urinary incontinence     Past Surgical History:  Procedure Laterality Date   ABDOMINAL HYSTERECTOMY     CARDIAC CATHETERIZATION  Jan 2005   Pre-op R&LHC -- Nonobstructive CAD; normal PA pressures   CARDIOVERSION  06/04/2012   Procedure: CARDIOVERSION;  Surgeon: JCarlena Bjornstad MD;  [place] [facility]  Service: Cardiovascular;  Laterality: N/A;   CARDIOVERSION N/A 10/07/2014   Procedure: CARDIOVERSION;  Surgeon: TSueanne Margarita MD;  [place] [facility]  Service: Cardiovascular;  Laterality: N/A;   COLONOSCOPY WITH PROPOFOL N/A 07/11/2017   Procedure: COLONOSCOPY WITH PROPOFOL;  Surgeon: TLa Belle TLynnwood[place]  K, MD;  Location: [facility] ENDOSCOPY;  Service: Gastroenterology;  Laterality: N/A;   LUMBAR DISC SURGERY     x 2 distantly   MITRAL VALVE REPAIR  2005   with [facility] Maze for [employer]   NM MYOVIEW LTD  4/7/'16   EF 37% with septal hypokinesis and diffuse hypokinesis. No ischemia or infarction. 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   RIGHT HEART CATHETERIZATION N/A 10/03/2014   Procedure: RIGHT HEART CATH;  Surgeon: Jolaine Artist, MD;  Location: [facility] CATH LAB;  Service: Cardiovascular;  RAP 37mHg, RVP 35/2/9 mmHg, PAP 45/12 mmHg, PCWP 16 mmHg; CO/I by Fick: 3.9/2.3; Ao/PA/SVC SaO2%: 97%/63%/65%.   TEE WITHOUT CARDIOVERSION N/A 10/07/2014    Procedure: TRANSESOPHAGEAL ECHOCARDIOGRAM (TEE);  Surgeon: TSueanne Margarita MD;  [place] [facility]  Service: Cardiovascular;  Laterality: N/A;   THYROIDECTOMY, PARTIAL     partial-no cancer; now on thyroid replacement   TRANSTHORACIC ECHOCARDIOGRAM  2013; 2/'16 & 5/'16   a) 2013: EF 60-65%, mild MR, Gr 2 DD; b) 2/'16 [place] EF ~10% - severel global LV dysfunction (systolic & diastolic) - dilated LV & restrictive filling pattern - Gr 3 DD, mod reduced RV function, severely dilated LA - 6.4 cm, mod dilated RA, mod pl effusion, mod MR, mild TR; c) 5/10/'16:  EF 25-30%, mod LV dilation, no RWMA,Gr 3 DD w/ high LAP, MV sewing ring intact w/ Mod MR, Severe LA dilation   TRANSTHORACIC ECHOCARDIOGRAM  January 2017:   EF improved to 45-50%. Mild diffuse HK. Mild MR. Moderate LA dilation. Normal PA pressures.    There were no vitals filed for this visit.   Subjective Assessment - 04/07/21 1006     Subjective Pt reported she's been feeling "pretty good" with everything. Some of the HEP has been challenging but tolerable.    Pertinent History Rheumatic mitral valve, hysterectomy, a-fib, CHF, Dilated cardiomyopathy 2/2 to tachy,  HTN, hypothyroid, GERD, mild dementia, B OA knees, fx of L pelvis (years ago about 10), vaginal dryness-pain with intercourse but no longer coconut oil, HLD, asthma-pt denied, allergies, depression, diverticulosis, h/o reheumatic fever, migraines, osteopenia  Back pain, SUI 3/22, painful intercourse    Patient Stated Goals I would love it if I didn't leak.    Currently in Pain? Yes    Pain Score 6     Pain Location Knee    Pain Orientation Left;Right    Pain Descriptors / Indicators Aching    Pain Type Chronic pain    Pain Onset More than a month ago    Pain Frequency Intermittent    Aggravating Factors  sit<>stand txfs    Pain Relieving Factors Tylenol                             NMR:  [facility] Adult PT Treatment/Exercise - 04/07/21 1102        Transfers   Transfers Sit to Stand;Stand to Sit    Sit to Stand 5: Supervision    Sit to Stand Details Verbal cues for sequencing;Verbal cues for technique;Visual cues/gestures for sequencing    Sit to Stand Details (indicate cue type and reason) Pt performed approx. 5 STS txfs with PT cueing pt to exhale and then contract pelvic floor prior to standing to decr. leakage and safety during STS txfs.    Stand to Sit --   see above     High Level Balance  High Level Balance Activities Other (comment)    High Level Balance Comments Performed with 1-2 UE support for safety and S for safety. Pt performed ant/lat/post stepping then SLS x5 reps/LE/stepping direction with pelvic floor contraction. Cues for technique and exhalation with SLS stance/PF contraction.      Neuro Re-ed    Neuro Re-ed Details  Pt performed diaphragmatic breathing 2x5 reps with pillow pressed to abdomen to block forward movement to encourage breath into back (diaphragm and hip flexor attachment). Pelvic tilts in ant/neutral/post performed x10 reps in supine and seated positions. Cues and demo for technique.      Manual Therapy   Manual Therapy Soft tissue mobilization;Muscle Energy Technique    Manual therapy comments Pt reported decr. discomfort and pain in B hip flexors and diaphragm and reported it was easier to perform ant. pelvic tilt and diaphragmatic breathing.    Soft tissue mobilization PT performed massage and trigger point release to L hip flexors with with 3 x30sec. hold B supine hip flexor stretch after manual therapy.    Muscle Energy Technique PT had pt perform DKTC with resistance 3x10 sec. holds and then R and L hip flexor stretch to improve pain and ROM.                    PT Education - 04/07/21 1108     Education Details PT explained proximal hip flexor attachments and breathing into back to decr. hip flexor tightness (proximal attachment). PT encouraged pt to continue PF contraction during STS  txfs to decr. leakage during txfs. PT discussed options for strength training after PT (barre, pilates and aquatics).    Person(s) Educated Patient    Methods Explanation    Comprehension Verbalized understanding              PT Short Term Goals - 03/24/21 1010       PT SHORT TERM GOAL #1   Title Pt will be IND in HEP to improve leakage, posture, and strength. TARGET DATE FOR ALL STGS: 03/09/21    Time 4    Period Weeks    Status Partially Met      PT SHORT TERM GOAL #2   Title Pt will report using < 4 pads/day 2/2 incontinence to improve QOL and leakage.    Baseline 4-8 pads/day baseline; approx. 5 pads on 03/24/21.    Time 4    Period Weeks    Status Partially Met      PT SHORT TERM GOAL #3   Title Pt will improve B SLS to 10 sec. on each side in order to improve safety during amb.    Baseline 10 sec. on each LE    Time 4    Period Weeks    Status Achieved               PT Long Term Goals - 02/09/21 1216       PT LONG TERM GOAL #1   Title Pt will be IND in progressed HEP to improve leakage, posture, strength and balance. TARGET DATE FOR ALL LTGS: 04/20/21    Time 10    Period Weeks    Status New      PT LONG TERM GOAL #2   Title Pt will improve FOTO (urinary problem) score from 34 to 50 for improve QOL.    Baseline 34    Time 10    Period Weeks    Status New  PT LONG TERM GOAL #3   Title Complete gait, strength and mobility assessment and write goals as indicated.    Time 10    Period Weeks    Status New      PT LONG TERM GOAL #4   Title Pt will report no falls over the last 4 weeks in order to improve safety, especially when trying to rush to the bathroom.    Baseline 6 falls in 6 months (approx.)    Time 10    Period Weeks    Status New                   Plan - 04/07/21 1110     Clinical Impression Statement Pt continues to demonstrate progress as she was able to progress from 2 UE support to 0-1 UE support during stepping/PF  contraction balance activity at counter. Pt demonstrated improve ROM and decr. tension during pelvic tilts after manual therapy performed. Pt continues to be limited by B knee pain as this limits her mobility. Pt also continues to experience leakage, decr. strength, impaired balance, and pain and would continue to benefit from skilled PT to improve safety during all ADLs.    Comorbidities Rheumatic mitral valve, hysterectomy, a-fib, CHF, Dilated cardiomyopathy 2/2 to tachy,  HTN, hypothyroid, GERD, mild dementia, B OA knees, fx of L pelvis (years ago about 10), vaginal dryness-pain with intercourse but no longer coconut oil, HLD, asthma-pt denied, allergies, depression, diverticulosis, h/o reheumatic fever, migraines, osteopenia  Back pain, SUI 3/22, painful intercourse    PT Treatment/Interventions ADLs/Self Care Home Management;Biofeedback;Functional mobility training;Stair training;Gait training;DME Instruction;Therapeutic activities;Therapeutic exercise;Neuromuscular re-education;Balance training;Patient/family education;Manual techniques;Dry needling;Spinal Manipulations;Joint Manipulations    PT Next Visit Plan Assess goals and either hold or d/c. Continue breath with pillow. STS txf with PF contraction, SLS balance with PF contraction and breath (add to HEP), progress pelvic floor contraction, sacrum/coccyx mobility, assess for hip alignment and treat prn, review HEP prn.    PT Home Exercise Plan Medbridge: [email or web address]    [id number] and Agree with Plan of Care Patient             Patient will benefit from skilled therapeutic intervention in order to improve the following deficits and impairments:  Abnormal gait, Decreased balance, Decreased endurance, Decreased mobility, Hypomobility, Decreased coordination, Decreased knowledge of use of DME, Decreased strength, Impaired flexibility, Postural dysfunction, Other (comment), Pain  Visit Diagnosis: Mixed incontinence  Muscle weakness  (generalized)  Other lack of coordination  Other abnormalities of gait and mobility  Abnormal posture     Problem List Patient Active Problem List   Diagnosis Date Noted   Cellulitis of left lower extremity 03/22/2021   Essential hypertension 07/12/2018   Primary osteoarthritis of both knees 10/12/2016   Fracture of left pelvis (HCC) 04/29/2015   PAF (paroxysmal atrial fibrillation) ([place]) 03/04/2015   On amiodarone therapy 11/14/2014   Current use of long term anticoagulation 11/14/2014   Mild dementia ([place]) 10/24/2014   Chronic combined systolic and diastolic CHF, NYHA class 1 ([place]) 09/29/2014   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 [place], [place], [address] Phone: [phone]   Fax:  [phone]  Name: [name] MRN: [id number] Date of Birth: 01-29-1946   Geoffry Paradise, PT,DPT 04/07/21 11:13 AM Phone: [phone] Fax: [phone]

## 2021-04-14 ENCOUNTER — Ambulatory Visit: Payer: Medicare HMO

## 2021-05-05 ENCOUNTER — Other Ambulatory Visit: Payer: Self-pay

## 2021-05-05 ENCOUNTER — Ambulatory Visit (INDEPENDENT_AMBULATORY_CARE_PROVIDER_SITE_OTHER): Payer: Medicare HMO

## 2021-05-05 DIAGNOSIS — Z5181 Encounter for therapeutic drug level monitoring: Secondary | ICD-10-CM | POA: Diagnosis not present

## 2021-05-05 DIAGNOSIS — Z7901 Long term (current) use of anticoagulants: Secondary | ICD-10-CM

## 2021-05-05 DIAGNOSIS — I4891 Unspecified atrial fibrillation: Secondary | ICD-10-CM | POA: Diagnosis not present

## 2021-05-05 LAB — POCT INR: INR: 3.4 — AB (ref 2.0–3.0)

## 2021-05-05 NOTE — Patient Instructions (Signed)
-   skip warfarin tonight, then - resume dosage of warfarin 1 tablet daily EXCEPT 1/2 tablet on [place]. - recheck in 6 weeks.  *CALL and let me know if you are put on an antibiotic for your sinuses

## 2021-05-06 ENCOUNTER — Encounter: Payer: Self-pay | Admitting: Family Medicine

## 2021-05-06 ENCOUNTER — Telehealth (INDEPENDENT_AMBULATORY_CARE_PROVIDER_SITE_OTHER): Payer: Medicare HMO | Admitting: Family Medicine

## 2021-05-06 VITALS — BP 129/67 | HR 61 | Ht 67.5 in | Wt 164.4 lb

## 2021-05-06 DIAGNOSIS — Z20822 Contact with and (suspected) exposure to covid-19: Secondary | ICD-10-CM | POA: Diagnosis not present

## 2021-05-06 MED ORDER — CEFDINIR 300 MG PO CAPS: 600.0000 mg | ORAL_CAPSULE | Freq: Every day | ORAL | 0 refills | Status: DC

## 2021-05-06 NOTE — Progress Notes (Signed)
Deborah Fojtik T. Mayley Lish, MD Primary Care and [place] at [facility] [place], [address] Phone: [phone]  FAX: [phone]  [name] - 75 y.o. female  MRN [id number]  Date of Birth: [birth year]  Visit Date: 05/06/2021  PCP: Owens Loffler, MD  Referred by: Owens Loffler, MD  Virtual Visit via Video Note:  I connected with  SALEHA RANSHAW on 05/06/2021 12:00 PM EDT by a video enabled telemedicine application and verified that I am speaking with the correct person using two identifiers.   Location patient: home computer, tablet, or smartphone Location provider: work or home office Consent: Verbal consent directly obtained from [name]. Persons participating in the virtual visit: patient, provider  I discussed the limitations of evaluation and management by telemedicine and the availability of in person appointments. The patient expressed understanding and agreed to proceed.  Chief Complaint  Patient presents with   Sinusitis   Headache   Cough    History of Present Illness:  Acute illness:  Sounds congested and last Thursday and started to feel bed.  Mucous and coughing.  Will have yellowish green mucous.  Has taken some nyquil, robitussin, etc.  Terrible HA and hoarse.   No GI symptoms. No fever  Last night woke up in a sweat.  She has not taken a COVID test.  Immunization History  Administered Date(s) Administered   Fluad Quad(high Dose 65+) 07/29/2020   Influenza Split 05/11/2011   Influenza Whole 06/30/2010   Influenza, High Dose Seasonal PF 06/27/2015, 05/01/2018, 04/28/2019   Influenza,inj,Quad PF,6+ Mos 07/29/2013, 05/27/2016, 05/16/2017   PFIZER(Purple Top)SARS-COV-2 Vaccination 09/02/2019, 09/23/2019, 06/02/2020   Pneumococcal Conjugate-13 08/26/2015   Pneumococcal Polysaccharide-23 04/05/2010   Td 02/27/2019   Zoster Recombinat (Shingrix) 03/29/2018, 08/25/2018     Review of  Systems as above: See pertinent positives and pertinent negatives per HPI No acute distress verbally   Observations/Objective/Exam:  An attempt was made to discern vital signs over the phone and per patient if applicable and possible.   General:    Alert, Oriented, appears well and in no acute distress  Pulmonary:     On inspection no signs of respiratory distress.  Psych / Neurological:     Pleasant and cooperative.  Assessment and Plan:    ICD-10-CM   1. Suspected COVID-19 virus infection  Z20.822      Truthfully, I think she probably has COVID.  She is going to do a test right now.  If this is positive, then no need for antibiotics and continue supportive care.  This is negative, certainly sinusitis cannot be excluded.  I think some cefdinir is probably not unreasonable and has no interaction with warfarin.  I discussed the assessment and treatment plan with the patient. The patient was provided an opportunity to ask questions and all were answered. The patient agreed with the plan and demonstrated an understanding of the instructions.   The patient was advised to call back or seek an in-person evaluation if the symptoms worsen or if the condition fails to improve as anticipated.  Follow-up: prn unless noted otherwise below No follow-ups on file.  Meds ordered this encounter  Medications   cefdinir (OMNICEF) 300 MG capsule    Sig: Take 2 capsules (600 mg total) by mouth daily.    Dispense:  20 capsule    Refill:  0   No orders of the defined types were placed in this encounter.  Signed,  Maud Deed. Deliliah Spranger, MD

## 2021-05-26 ENCOUNTER — Ambulatory Visit: Payer: Medicare HMO

## 2021-06-02 ENCOUNTER — Ambulatory Visit: Payer: Medicare HMO

## 2021-06-16 ENCOUNTER — Other Ambulatory Visit: Payer: Self-pay

## 2021-06-16 ENCOUNTER — Ambulatory Visit (INDEPENDENT_AMBULATORY_CARE_PROVIDER_SITE_OTHER): Payer: Medicare HMO

## 2021-06-16 DIAGNOSIS — Z5181 Encounter for therapeutic drug level monitoring: Secondary | ICD-10-CM

## 2021-06-16 DIAGNOSIS — I4891 Unspecified atrial fibrillation: Secondary | ICD-10-CM | POA: Diagnosis not present

## 2021-06-16 DIAGNOSIS — Z7901 Long term (current) use of anticoagulants: Secondary | ICD-10-CM

## 2021-06-16 LAB — POCT INR: INR: 2.4 (ref 2.0–3.0)

## 2021-06-16 NOTE — Patient Instructions (Signed)
-   continue dosage of warfarin 1 tablet daily EXCEPT 1/2 tablet on MONDAYS & FRIDAYS. - recheck in 6 weeks.  

## 2021-06-30 ENCOUNTER — Other Ambulatory Visit: Payer: Self-pay | Admitting: Internal Medicine

## 2021-06-30 NOTE — Telephone Encounter (Signed)
Review for refill Thanks !

## 2021-07-03 ENCOUNTER — Other Ambulatory Visit: Payer: Self-pay | Admitting: Family Medicine

## 2021-07-16 ENCOUNTER — Ambulatory Visit: Payer: Medicare HMO | Admitting: Internal Medicine

## 2021-07-16 ENCOUNTER — Encounter: Payer: Self-pay | Admitting: Internal Medicine

## 2021-07-16 ENCOUNTER — Other Ambulatory Visit
Admission: RE | Admit: 2021-07-16 | Discharge: 2021-07-16 | Disposition: A | Discharge status: Home / Self Care | Payer: Medicare HMO | Attending: Internal Medicine | Admitting: Internal Medicine

## 2021-07-16 ENCOUNTER — Other Ambulatory Visit: Payer: Self-pay

## 2021-07-16 VITALS — BP 138/78 | HR 51 | Ht 67.5 in | Wt 164.2 lb

## 2021-07-16 DIAGNOSIS — I058 Other rheumatic mitral valve diseases: Secondary | ICD-10-CM

## 2021-07-16 DIAGNOSIS — I4891 Unspecified atrial fibrillation: Secondary | ICD-10-CM | POA: Diagnosis present

## 2021-07-16 DIAGNOSIS — I5020 Unspecified systolic (congestive) heart failure: Secondary | ICD-10-CM

## 2021-07-16 DIAGNOSIS — Z79899 Other long term (current) drug therapy: Secondary | ICD-10-CM | POA: Diagnosis present

## 2021-07-16 DIAGNOSIS — I480 Paroxysmal atrial fibrillation: Secondary | ICD-10-CM

## 2021-07-16 DIAGNOSIS — I428 Other cardiomyopathies: Secondary | ICD-10-CM

## 2021-07-16 LAB — CBC
HCT: 38.7 % (ref 36.0–46.0)
Hemoglobin: 12.7 g/dL (ref 12.0–15.0)
MCH: 33.2 pg (ref 26.0–34.0)
MCHC: 32.8 g/dL (ref 30.0–36.0)
MCV: 101 fL — ABNORMAL HIGH (ref 80.0–100.0)
Platelets: 185 10*3/uL (ref 150–400)
RBC: 3.83 MIL/uL — ABNORMAL LOW (ref 3.87–5.11)
RDW: 13.5 % (ref 11.5–15.5)
WBC: 4.5 10*3/uL (ref 4.0–10.5)
nRBC: 0 % (ref 0.0–0.2)

## 2021-07-16 LAB — COMPREHENSIVE METABOLIC PANEL
ALT: 36 U/L (ref 0–44)
AST: 30 U/L (ref 15–41)
Albumin: 4.1 g/dL (ref 3.5–5.0)
Alkaline Phosphatase: 71 U/L (ref 38–126)
Anion gap: 7 (ref 5–15)
BUN: 13 mg/dL (ref 8–23)
CO2: 27 mmol/L (ref 22–32)
Calcium: 9.1 mg/dL (ref 8.9–10.3)
Chloride: 108 mmol/L (ref 98–111)
Creatinine, Ser: 0.82 mg/dL (ref 0.44–1.00)
GFR, Estimated: >60 mL/min (ref >60)
Glucose, Bld: 96 mg/dL (ref 70–99)
Potassium: 4.7 mmol/L (ref 3.5–5.1)
Sodium: 142 mmol/L (ref 135–145)
Total Bilirubin: 0.6 mg/dL (ref 0.3–1.2)
Total Protein: 6.9 g/dL (ref 6.5–8.1)

## 2021-07-16 LAB — TSH: TSH: 5.74 u[IU]/mL — ABNORMAL HIGH (ref 0.350–4.500)

## 2021-07-16 NOTE — Progress Notes (Signed)
Follow-up Outpatient Visit Date: 07/16/2021  Primary Care Provider: Owens Loffler, MD [place] [address]  Chief Complaint: Follow-up valvular heart disease, atrial fibrillation/flutter, and cardiomyopathy  HPI:  [name] is a 75 y.o. 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  She was last seen in our office a year ago by Marrianne Mood, PA, at which time she was doing well, though she noted some exertional dyspnea but attributed this to ongoing knee pain.  She was referred for PFTs given respiratory concerns and long-term amiodarone therapy; these were normal.  Follow-up echocardiogram in 09/2020 showed normal LVEF with moderate LVH.  Mild RVH was noted with normal RV function and PA pressure.  Severe left atrial enlargement was noted.  Mild mitral regurgitation without stenosis was noted in the setting of previous repair.  Today, [name] reports that she has been doing well from a heart standpoint.  She has not had any chest pain, shortness of breath, palpitations, lightheadedness, or edema.  She continues to have problems with her knees and has undergone a series of silicone injections.  She is hopeful that this will help her symptoms.  She has been advised by her orthopedist that she is not a candidate for knee replacement.  She notes occasional numbness in her hands at night.  She reports mild bradycardia at home, though heart rates are usually in the upper 50s.  She has not had any bleeding, remaining on warfarin managed by our anticoagulation clinic.  --------------------------------------------------------------------------------------------------  Past Medical History:   Diagnosis Date   Acne rosacea    Anxiety    Asthma    Atrial fibrillation and flutter ([place]) 09/2014   Revereted from Afib RVR to 2:1 A Flutter -- TEE/ DCCV 2/9; on Amiodarone   Chronic combined systolic and diastolic CHF, NYHA class 1 ([place]) -- Systolic dysfunction [id number][E83.15] 09/2014   Exacerbation 09/2014 2/2 Afib/flutter with RVR - 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 ([place]) 09/2014   a) [place] Echo: EF ~10% Severe LV dilation & global LV Systolic dysfxn, restrictive filling pattern - Gr 3 DD, mod RV dysfxn, severe LA dilation - 6.4 cm, mod RA dil, mod pl effusion, mod MR, mild TR; b) TEE 10/07/14: EF ~10%, Severe LV dilation & dysfxn Mod RV dysfxn, LAA oversewn, Mod RA & TR   Diverticulosis    H/O: rheumatic fever    Childhood   HYPERLIPIDEMIA 04/05/2010   Qualifier: Diagnosis of  By: [name] MD, [place]     Hypothyroid    On replacement therapy; normal TSH 09/2014   Migraine headache    Osteopenia    Paroxysmal atrial fibrillation ([place]) 2005; Recurrent 09/2014   a) 2005: s/p Cox Maze & LAA ligation; b) 09/2014: TEE-cardioversion   Rheumatic mitral and aortic valve insufficiency 2005   a) s/p MV Ring repair; with Cox-Maze for Afib; b) Echo 2013: EF 60-65%,no Regional WMA, Gr 2 DD, no Sig MR ;; c) TEE 10/07/2014: Severlely thickened and calcified MV leaflets.  Posterior MV leaflet is fixed  and immobile.  MAC.  reduced excursion of the anterior MV leaflet.  Mean MV gradient was 59m Hg and MVA calculated at cm2.      Urinary incontinence    Past Surgical History:  Procedure Laterality Date   ABDOMINAL HYSTERECTOMY     CARDIAC CATHETERIZATION  Jan 2005   Pre-op R&LHC -- Nonobstructive CAD; normal PA pressures   CARDIOVERSION   06/04/2012   Procedure: CARDIOVERSION;  Surgeon: JCarlena Bjornstad MD;  [facility]  Service: Cardiovascular;  Laterality: N/A;   CARDIOVERSION N/A 10/07/2014   Procedure: CARDIOVERSION;  Surgeon: TSueanne Margarita MD;  [facility]  Service: Cardiovascular;  Laterality: N/A;   COLONOSCOPY WITH PROPOFOL N/A 07/11/2017   Procedure: COLONOSCOPY WITH PROPOFOL;  Surgeon: [place], TBenay Pike MD;  Location: [facility];  Service: Gastroenterology;  Laterality: N/A;   LUMBAR DISC SURGERY     x 2 distantly   MITRAL VALVE REPAIR  2005   with [facility] Maze for [employer]  NM MYOVIEW LTD  4/7/'16   EF 37% with septal hypokinesis and diffuse hypokinesis. No ischemia or infarction. 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   RIGHT HEART CATHETERIZATION N/A 10/03/2014   Procedure: RIGHT HEART CATH;  Surgeon: DJolaine Artist MD;  [facility];  Service: Cardiovascular;  RAP 547mg, RVP 35/2/9 mmHg, PAP 45/12 mmHg, PCWP 16 mmHg; CO/I by Fick: 3.9/2.3; Ao/PA/SVC SaO2%: 97%/63%/65%.   TEE WITHOUT CARDIOVERSION N/A 10/07/2014   Procedure: TRANSESOPHAGEAL ECHOCARDIOGRAM (TEE);  Surgeon: TrSueanne MargaritaMD;  [facility] Service: Cardiovascular;  Laterality: N/A;   THYROIDECTOMY, PARTIAL     partial-no cancer; now on thyroid replacement   TRANSTHORACIC ECHOCARDIOGRAM  2013; 2/'16 & 5/'16   a) 2013: EF 60-65%, mild MR, Gr 2 DD; [place] - severel global LV dysfunction (systolic & diastolic) - dilated LV & restrictive filling pattern - Gr 3 DD, mod reduced RV function, severely dilated LA - 6.4 cm, mod dilated RA, mod pl effusion, mod MR, mild TR; c) 5/10/'16:  EF 25-30%, mod LV dilation, no RWMA,Gr 3 DD w/ high LAP, MV sewing ring intact w/ Mod MR, Severe LA dilation   TRANSTHORACIC ECHOCARDIOGRAM  January 2017:   EF improved to 45-50%. Mild diffuse HK. Mild MR. Moderate LA dilation. Normal PA pressures.      Recent CV Pertinent Labs: Lab Results  Component Value Date   CHOL 175 08/12/2020   CHOL 123 09/30/2014   HDL 56.40 08/12/2020   HDL 53 09/30/2014   LDLCALC 84 08/12/2020   LDLCALC 51 09/30/2014   LDLDIRECT 132.3 03/31/2010   TRIG 173.0 (H) 08/12/2020   TRIG 93 09/30/2014   CHOLHDL 3 08/12/2020   INR 2.4 06/16/2021   INR 3.57 06/12/2017   INR 2.5 10/03/2014   BNP 691.1 (H) 10/15/2014   K 4.7 08/12/2020   K 4.2 10/03/2014   MG 2.1 10/03/2014   MG 2.3 10/03/2014   BUN 20 08/12/2020   BUN 13 07/13/2017   BUN 8 10/03/2014   CREATININE 0.91 08/12/2020   CREATININE 0.81 10/03/2014   CREATININE 1.06 09/10/2012    Past medical and surgical history were reviewed and updated in EPIC.  Current Meds  Medication Sig   amiodarone (PACERONE) 200 MG tablet Take 1 tablet (200 mg total) by mouth daily. Please schedule office visit for further refills. Thank you!   b complex vitamins tablet Take 1 tablet daily by  mouth.   Calcium Citrate-Vitamin D (CALCIUM + D PO) Take 1 capsule daily by mouth.    carvedilol (COREG) 3.125 MG tablet TAKE 1 TABLET BY MOUTH TWICE A DAY   cefdinir (OMNICEF) 300 MG capsule Take 2 capsules (600 mg total) by mouth daily.   cetirizine (ZYRTEC) 10 MG tablet Take 10 mg by mouth daily.   donepezil (ARICEPT) 5 MG tablet TAKE 1 TABLET BY MOUTH DAILY AT BEDTIME   fluticasone (FLONASE) 50 MCG/ACT nasal spray PLACE TWO SPRAYS INTO BOTH NOSTRILS DAILY   IRON, FERROUS SULFATE, PO Take 1 tablet by mouth daily.   levothyroxine (SYNTHROID) 50 MCG tablet TAKE 1 TAB BY MOUTH ONCE DAILY. TAKE ON AN EMPTY STOMACH WITH A GLASS OF WATER ATLEAST 30-60 MINUTES BEFORE BREAKFAST   losartan (COZAAR) 25 MG tablet Take 1 tablet (25 mg total) by mouth daily.   metroNIDAZOLE (METROCREAM) 0.75 % cream Apply topically 2 (two) times daily.   Omega-3 Fatty Acids (FISH OIL PO) Take 1 capsule by mouth daily.   permethrin (ELIMITE) 5 % cream APPLY PEA SIZED AMOUNT TO FACE ONCE A DAY    traZODone (DESYREL) 50 MG tablet TAKE 1/2 TO 1 TABLET BY MOUTH AT BEDTIME AS NEEDED FOR SLEEP   warfarin (COUMADIN) 5 MG tablet TAKE 1/2 TO 1 TABLET BY MOUTH DAILY AS DIRECTED BY COUMADIN CLINIC    Allergies: Patient has no known allergies.  Social History   Tobacco Use   Smoking status: Never   Smokeless tobacco: Never  Vaping Use   Vaping Use: Never used  Substance Use Topics   Alcohol use: Not Currently   Drug use: No    Family History  Problem Relation Age of Onset   Diabetes Mother    Coronary artery disease Mother    Kidney disease Mother    Breast cancer Neg Hx     Review of Systems: A 12-system review of systems was performed and was negative except as noted in the HPI.  --------------------------------------------------------------------------------------------------  Physical Exam: BP 138/78 (BP Location: Left Arm, Patient Position: Sitting, Cuff Size: Normal)   Pulse (!) 51   Ht 5' 7.5" (1.715 m)   Wt 164 lb 4 oz (74.5 kg)   SpO2 98%   BMI 25.35 kg/m   General:  NAD. Neck: No JVD or HJR. Lungs: Clear to auscultation bilaterally without wheezes or crackles. Heart: Bradycardic but regular with 1/6 systolic murmur. Abdomen: Soft, nontender, nondistended. Extremities: No lower extremity edema.  EKG: Sinus bradycardia with first-degree AV block anterolateral ST/T changes.  PR interval slightly longer compared to 07/16/2020.  Otherwise, no significant interval change.  Lab Results  Component Value Date   WBC 7.1 08/12/2020   HGB 12.7 08/12/2020   HCT 38.3 08/12/2020   MCV 100.4 (H) 08/12/2020   PLT 198.0 08/12/2020    Lab Results  Component Value Date   NA 140 08/12/2020   K 4.7 08/12/2020   CL 104 08/12/2020   CO2 30 08/12/2020   BUN 20 08/12/2020   CREATININE 0.91 08/12/2020   GLUCOSE 89 08/12/2020   ALT 25 08/12/2020    Lab Results  Component Value Date   CHOL 175 08/12/2020   HDL 56.40 08/12/2020   LDLCALC 84 08/12/2020   LDLDIRECT  132.3 03/31/2010   TRIG 173.0 (H) 08/12/2020   CHOLHDL 3 08/12/2020    --------------------------------------------------------------------------------------------------  ASSESSMENT AND PLAN: Mitral valve disease: [name] is status post mitral valve repair without signs or symptoms of heart failure.  Most recent  echo in 2/22 showed appropriate postrepair function of the mitral valve with only mild regurgitation and no stenosis.  Continue current medications.  Paroxysmal atrial fibrillation: No palpitations reported.  [name] remains somewhat bradycardic with first-degree AV block that has lengthened slightly since last year.  We discussed discontinuation of carvedilol, though given that she is asymptomatic and has been tolerating this medication well, she does not wish to discontinue at this time.  She remains on amiodarone as well.  We will check a CBC, CMP, and TSH for routine monitoring.  PFTs last year were normal.  Continue anticoagulation with warfarin.  We agreed to defer switching to a NOAC given her valvular atrial fibrillation (albeit status post mitral valve repair) and cost constraints.  HFrEF with recovered ejection fraction due to nonischemic cardiomyopathy: Reduced LVEF thought to be tachycardia mediated, with recovery of systolic function with maintenance of sinus rhythm.  EF normal on most recent echo in 2/22.  As above, [name] does not wish to discontinue carvedilol, which we will continue at 3.125 mg twice daily as long as she does not develop symptoms of bradycardia or demonstrate significant worsening of her first-degree AV block.  Continue current dose of losartan as well.  Follow-up: Return to clinic in 1 year.  Nelva Bush, MD 07/16/2021 9:36 AM

## 2021-07-16 NOTE — Patient Instructions (Signed)
Medication Instructions:   Your physician recommends that you continue on your current medications as directed. Please refer to the Current Medication list given to you today.  *If you need a refill on your cardiac medications before your next appointment, please call your pharmacy*   Lab Work:  Today at the medical mall at [facility]: CBC, CMET, TSH  -  Please go to the [place] will check in at the front desk to the right as you walk into the atrium.   If you have labs (blood work) drawn today and your tests are completely normal, you will receive your results only by: [place] (if you have MyChart) OR A paper copy in the mail If you have any lab test that is abnormal or we need to change your treatment, we will call you to review the results.   Testing/Procedures:  None ordered   Follow-Up: At [facility], you and your health needs are our priority.  As part of our continuing mission to provide you with exceptional heart care, we have created designated Provider Care Teams.  These Care Teams include your primary Cardiologist (physician) and Advanced Practice Providers (APPs -  Physician Assistants and Nurse Practitioners) who all work together to provide you with the care you need, when you need it.  We recommend signing up for the patient portal called "MyChart".  Sign up information is provided on this After Visit Summary.  MyChart is used to connect with patients for Virtual Visits (Telemedicine).  Patients are able to view lab/test results, encounter notes, upcoming appointments, etc.  Non-urgent messages can be sent to your provider as well.   To learn more about what you can do with MyChart, go to [email or web address].    Your next appointment:   1 year(s)  The format for your next appointment:   In Person  Provider:   You may see Nelva Bush, MD or one of the following Advanced Practice Providers on your designated Care Team:   Murray Hodgkins, NP Christell Faith, PA-C Cadence Kathlen Mody, [place]

## 2021-07-17 ENCOUNTER — Encounter: Payer: Self-pay | Admitting: Internal Medicine

## 2021-07-27 ENCOUNTER — Telehealth: Payer: Self-pay | Admitting: Family Medicine

## 2021-07-27 NOTE — Telephone Encounter (Signed)
-----   Message from [name], [place] sent at 07/27/2021  9:29 AM EST ----- Please call and schedule CPE with fasting labs prior with Dr. Lorelei Pont for sometime after 08/17/21.  She already has [place] with nurse scheduled.

## 2021-07-27 NOTE — Telephone Encounter (Signed)
Called Mrs. Raelin phone  rung and then hung up

## 2021-07-27 NOTE — Telephone Encounter (Signed)
-----   Message from Carter Kitten, [place] sent at 07/27/2021  9:29 AM EST ----- Please call and schedule CPE with fasting labs prior with Dr. Lorelei Pont for sometime after 08/17/21.  She already has [place] with nurse scheduled.

## 2021-07-28 ENCOUNTER — Other Ambulatory Visit: Payer: Self-pay

## 2021-07-28 ENCOUNTER — Ambulatory Visit (INDEPENDENT_AMBULATORY_CARE_PROVIDER_SITE_OTHER): Payer: Medicare HMO

## 2021-07-28 DIAGNOSIS — Z5181 Encounter for therapeutic drug level monitoring: Secondary | ICD-10-CM | POA: Diagnosis not present

## 2021-07-28 DIAGNOSIS — I4891 Unspecified atrial fibrillation: Secondary | ICD-10-CM

## 2021-07-28 DIAGNOSIS — Z7901 Long term (current) use of anticoagulants: Secondary | ICD-10-CM

## 2021-07-28 LAB — POCT INR: INR: 1.6 — AB (ref 2.0–3.0)

## 2021-08-03 ENCOUNTER — Other Ambulatory Visit: Payer: Self-pay | Admitting: Family Medicine

## 2021-08-03 MED ORDER — AMIODARONE HCL 200 MG PO TABS
200.0000 mg | ORAL_TABLET | Freq: Every day | ORAL | 11 refills | Status: DC
Start: 2021-08-03 — End: 2022-07-29

## 2021-08-03 NOTE — Telephone Encounter (Signed)
  Encourage patient to contact the pharmacy for refills or they can request refills through [place]:  Please schedule appointment if longer than 1 year  NEXT APPOINTMENT DATE:  MEDICATION:amiodarone (PACERONE) 200 MG tablet   Is the patient out of medication?   PHARMACY: [facility] [place]        Let patient know to contact pharmacy at the end of the day to make sure medication is ready.  Please notify patient to allow 48-72 hours to process  CLINICAL FILLS OUT ALL BELOW:   LAST REFILL:  QTY:  REFILL DATE:    OTHER COMMENTS:    Okay for refill?  Please advise

## 2021-08-03 NOTE — Telephone Encounter (Signed)
Last office visit 05/06/2021 for Suspected Covid 19 virus infection.  Last refilled 07/16/2020 by Arvil Chaco, PA-C (cardiology) for #30 with 11 refills.  Ok to refill or does refills need to be done by cardiology?  No future appointments with PCP.     Please call and schedule CPE with fasting labs prior with Dr. Lorelei Pont for sometime after 08/17/21.  She already has [place] with nurse scheduled.

## 2021-08-03 NOTE — Telephone Encounter (Signed)
Amiodarone needs to be managed by Cardiology.

## 2021-08-03 NOTE — Telephone Encounter (Signed)
Lvm to call the office  schedule a cpe/lab

## 2021-08-04 NOTE — Telephone Encounter (Signed)
LMTCB to schedule a cpe and lab

## 2021-08-11 ENCOUNTER — Ambulatory Visit (INDEPENDENT_AMBULATORY_CARE_PROVIDER_SITE_OTHER): Payer: Medicare HMO | Admitting: Family Medicine

## 2021-08-11 ENCOUNTER — Other Ambulatory Visit: Payer: Self-pay

## 2021-08-11 ENCOUNTER — Encounter: Payer: Self-pay | Admitting: Family Medicine

## 2021-08-11 VITALS — BP 110/60 | HR 53 | Temp 98.3°F | Ht 67.0 in | Wt 167.2 lb

## 2021-08-11 DIAGNOSIS — Z0000 Encounter for general adult medical examination without abnormal findings: Secondary | ICD-10-CM

## 2021-08-11 DIAGNOSIS — E038 Other specified hypothyroidism: Secondary | ICD-10-CM

## 2021-08-11 DIAGNOSIS — E559 Vitamin D deficiency, unspecified: Secondary | ICD-10-CM

## 2021-08-11 DIAGNOSIS — E785 Hyperlipidemia, unspecified: Secondary | ICD-10-CM

## 2021-08-11 NOTE — Patient Instructions (Signed)
Healthcare Power of [employer]. They do need to be notarized You can bring them to our office and they can put them in your permanent [facility] chart.

## 2021-08-11 NOTE — Progress Notes (Signed)
Deborah Madarang T. Fender Herder, MD, [place] at [facility] [place], [address]  Phone: [phone]   FAX: [phone]  [name] - 75 y.o. female   MRN [id number]   Date of Birth: 14-Apr-1946  Date: 08/11/2021   PCP: Owens Loffler, MD   Referral: Owens Loffler, MD  Chief Complaint  Patient presents with   Medicare Wellness    This visit occurred during the SARS-CoV-2 public health emergency.  Safety protocols were in place, including screening questions prior to the visit, additional usage of staff PPE, and extensive cleaning of exam room while observing appropriate contact time as indicated for disinfecting solutions.   Patient Care Team: Owens Loffler, MD as PCP - General (Family Medicine) End, Harrell Gave, MD as PCP - Cardiology (Cardiology) Subjective:   [name] is a 75 y.o. pleasant patient who presents for a medicare wellness examination:  Health Maintenance Summary Reviewed and updated, unless pt declines services.  Tobacco History Reviewed. Non-smoker Alcohol: No concerns, no excessive use Exercise Habits: not much right now.  Taking care of her animals every day.   STD concerns: none Drug Use: None Birth control method: n/a Menses regular: n/a Lumps or breast concerns: no Breast Cancer Family History: no  She continues to be plagued with knee pain.  She is currently doing what sounds like Orthovisc or Synvisc at [facility].  Health Maintenance  Topic Date Due   TETANUS/TDAP  02/26/2029   Pneumonia Vaccine 36+ Years old  Completed   INFLUENZA VACCINE  Completed   DEXA SCAN  Completed   COVID-19 Vaccine  Completed   Hepatitis C Screening  Completed   Zoster Vaccines- Shingrix  Completed   HPV VACCINES  Aged Out   COLONOSCOPY (Pts 45-10yr Insurance coverage will need to be confirmed)  Discontinued   Immunization History  Administered Date(s) Administered   Fluad Quad(high Dose  65+) 07/29/2020   Influenza Split 05/11/2011   Influenza Whole 06/30/2010   Influenza, High Dose Seasonal PF 06/27/2015, 05/01/2018, 04/28/2019, 06/10/2021   Influenza,inj,Quad PF,6+ Mos 07/29/2013, 05/27/2016, 05/16/2017   PFIZER(Purple Top)SARS-COV-2 Vaccination 09/02/2019, 09/23/2019, 06/02/2020   Pfizer Covid-19 Vaccine Bivalent Booster 155yr& up 06/10/2021   Pneumococcal Conjugate-13 08/26/2015   Pneumococcal Polysaccharide-23 04/05/2010   Td 02/27/2019   Zoster Recombinat (Shingrix) 03/29/2018, 08/25/2018    Patient Active Problem List   Diagnosis Date Noted   PAF (paroxysmal atrial fibrillation) (HCH[facility]07/01/2015    Priority: High   On amiodarone therapy 11/14/2014    Priority: High   Mild dementia 10/24/2014    Priority: High   Chronic combined systolic and diastolic CHF, NYHA class 1 (HCM[place]02/08/2014    Priority: High   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 (HC[place]02/08/2014    Priority: High   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, status post MVR 07/27/2009    Priority: High   Essential hypertension 07/12/2018    Priority: Medium    Current use of long term anticoagulation 11/14/2014    Priority: Medium    Hyperlipidemia LDL goal <100 04/05/2010    Priority: Medium    Hypothyroidism, unspecified 11/18/2008    Priority: Medium    Generalized anxiety disorder 11/18/2008    Priority: Medium    Major depressive disorder, recurrent episode, moderate with anxious distress (HCR[place]03/23/2010    Priority: Medium    Primary osteoarthritis of both knees 10/12/2016   Fracture of left pelvis (HC[facility]08/31/2016   GERD  04/05/2010   Mixed incontinence urge and stress 11/18/2008   NEPHROLITHIASIS, HX OF 11/18/2008    Past Medical History:  Diagnosis Date   Anxiety    Asthma    Atrial fibrillation and flutter ([place]) 09/29/2014   Revereted from Afib RVR to 2:1 A Flutter -- TEE/ DCCV 2/9; on Amiodarone   Chronic combined systolic and diastolic CHF, NYHA  class 1 ([place]) -- Systolic dysfunction HRCBULAG[T36.46] 09/29/2014   Exacerbation 09/2014 2/2 Afib/flutter with RVR - 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[place]) 09/29/2014   a) [place] Echo: EF ~10% Severe LV dilation & global LV Systolic dysfxn, restrictive filling pattern - Gr 3 DD, mod RV dysfxn, severe LA dilation - 6.4 cm, mod RA dil, mod pl effusion, mod MR, mild TR; b) TEE 10/07/14: EF ~10%, Severe LV dilation & dysfxn Mod RV dysfxn, LAA oversewn, Mod RA & TR   Diverticulosis    H/O: rheumatic fever    Childhood   HYPERLIPIDEMIA 04/05/2010   Qualifier: Diagnosis of  By: Jaicob Dia MD, Laymond Postle     Hypothyroid    On replacement therapy; normal TSH 09/2014   Migraine headache    Osteopenia    Paroxysmal atrial fibrillation ([place]) 2005; Recurrent 09/2014   a) 2005: s/p Cox Maze & LAA ligation; b) 09/2014: TEE-cardioversion   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  Posterior MV leaflet is fixed and immobile.  MAC.  reduced excursion of the anterior MV leaflet.  Mean MV gradient was 51m Hg and MVA calculated at cm2.      Urinary incontinence     Past Surgical History:  Procedure Laterality Date   ABDOMINAL HYSTERECTOMY     CARDIAC CATHETERIZATION  Jan 2005   Pre-op R&LHC -- Nonobstructive CAD; normal PA pressures   CARDIOVERSION  06/04/2012   Procedure: CARDIOVERSION;  Surgeon: JCarlena Bjornstad MD;  [facility]  Service: Cardiovascular;  Laterality: N/A;   CARDIOVERSION N/A 10/07/2014   Procedure: CARDIOVERSION;  Surgeon: TSueanne Margarita MD;  [facility]  Service: Cardiovascular;  Laterality: N/A;   COLONOSCOPY WITH PROPOFOL N/A 07/11/2017   Procedure: COLONOSCOPY WITH PROPOFOL;  Surgeon: [place], TBenay Pike MD;  Location: [facility];  Service: Gastroenterology;  Laterality: N/A;   LUMBAR DISC SURGERY     x 2 distantly   MITRAL VALVE REPAIR  2005   with [facility] Maze for [employer]  NM MYOVIEW LTD  4/7/'16   EF 37% with septal hypokinesis and diffuse hypokinesis. No ischemia or infarction. 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   RIGHT HEART CATHETERIZATION N/A 10/03/2014   Procedure: RIGHT HEART CATH;  Surgeon: DJolaine Artist MD;  [facility];  Service: Cardiovascular;  RAP 566mg, RVP 35/2/9 mmHg, PAP 45/12 mmHg, PCWP 16 mmHg; CO/I by Fick: 3.9/2.3; Ao/PA/SVC SaO2%: 97%/63%/65%.   TEE WITHOUT CARDIOVERSION N/A 10/07/2014   Procedure: TRANSESOPHAGEAL ECHOCARDIOGRAM (TEE);  Surgeon: TrSueanne MargaritaMD;  [facility] Service: Cardiovascular;  Laterality: N/A;  THYROIDECTOMY, PARTIAL     partial-no cancer; now on thyroid replacement   TRANSTHORACIC ECHOCARDIOGRAM  2013; 2/'16 & 5/'16   a) 2013: EF 60-65%, mild MR, Gr 2 DD; b) 2/'16 @ [place]: EF ~10% - severel global LV dysfunction (systolic & diastolic) - dilated LV & restrictive filling pattern - Gr 3 DD, mod reduced RV function, severely dilated LA - 6.4 cm, mod dilated RA, mod pl effusion, mod MR, mild TR; c) 5/10/'16:  EF 25-30%, mod LV dilation, no RWMA,Gr 3 DD w/ high LAP, MV sewing ring intact w/ Mod MR, Severe LA dilation   TRANSTHORACIC ECHOCARDIOGRAM  January 2017:   EF improved to 45-50%. Mild diffuse HK. Mild MR. Moderate LA dilation. Normal PA pressures.    Family History  Problem Relation Age of Onset   Diabetes Mother    Coronary artery disease Mother    Kidney disease Mother    Breast cancer Neg Hx     Past Medical History, Surgical History, Social History, Family  History, Problem List, Medications, and Allergies have been reviewed and updated if relevant.  Review of Systems: Pertinent positives are listed above.  Otherwise, a full 14 point review of systems has been done in full and it is negative except where it is noted positive.  Objective:   BP 110/60    Pulse (!) 53    Temp 98.3 F (36.8 C) (Temporal)    Ht _0  (1.702 m)    Wt 167 lb 4 oz (75.9 kg)    SpO2 94%    BMI 26.20 kg/m  Fall Risk 03/04/2016 05/16/2017 05/18/2018 05/27/2019 08/14/2020  Falls in the past year? Yes No No 0 1  Was there an injury with Fall? Yes - - - 0  Fall Risk Category Calculator - - - - 2  Fall Risk Category - - - - Moderate  Patient Fall Risk Level - - - - Moderate fall risk  Patient at Risk for Falls Due to - - - Medication side effect Medication side effect  Fall risk Follow up Falls evaluation completed - - Falls evaluation completed;Falls prevention discussed Falls evaluation completed;Falls prevention discussed   Ideal Body Weight: Weight in (lb) to have BMI = 25: 159.3 Vision Screening   Right eye Left eye Both eyes  Without correction _1  With correction     Hearing Screening - Comments:: Wears Bilateral Hearing Aides Depression screen [facility] 2/9 08/11/2021 08/14/2020 05/27/2019 05/18/2018 05/16/2017  Decreased Interest 0 0 0 0 0  Down, Depressed, Hopeless 0 0 0 0 0  PHQ - 2 Score 0 0 0 0 0  Altered sleeping - 0 0 0 1  Tired, decreased energy - 0 0 0 1  Change in appetite - 0 0 0 0  Feeling bad or failure about yourself  - 0 0 0 0  Trouble concentrating - 0 0 0 0  Moving slowly or fidgety/restless - 0 0 0 0  Suicidal thoughts - 0 0 0 0  PHQ-9 Score - 0 0 0 2  Difficult doing work/chores - Not difficult at all Not difficult at all Not difficult at all Not difficult at all  Some recent data might be hidden     GEN: well developed, well nourished, no acute distress Eyes: conjunctiva and lids normal, PERRLA, EOMI ENT: TM clear, nares clear,  oral exam WNL Neck: supple, no lymphadenopathy, no thyromegaly, no JVD Pulm: clear to auscultation and percussion, respiratory effort normal CV: regular rate and  rhythm, S1-S2, no murmur, rub or gallop, no bruits Chest: no scars, masses, no lumps BREAST: breast exam declined GI: soft, non-tender; no hepatosplenomegaly, masses; active bowel sounds all quadrants GU: GU exam declined Lymph: no cervical, axillary or inguinal adenopathy MSK: gait normal, muscle tone and strength WNL, no joint swelling, effusions, discoloration, crepitus  SKIN: clear, good turgor, color WNL, no rashes, lesions, or ulcerations Neuro: normal mental status, normal strength, sensation, and motion Psych: alert; oriented to person, place and time, normally interactive and not anxious or depressed in appearance.   All labs reviewed with patient.  Lipids: Lab Results  Component Value Date   CHOL 175 08/12/2020   Lab Results  Component Value Date   HDL 56.40 08/12/2020   Lab Results  Component Value Date   LDLCALC 84 08/12/2020   Lab Results  Component Value Date   TRIG 173.0 (H) 08/12/2020   Lab Results  Component Value Date   CHOLHDL 3 08/12/2020   CBC: CBC Latest Ref Rng & Units 07/16/2021 08/12/2020 11/07/2018  WBC 4.0 - 10.5 K/uL 4.5 7.1 4.1  Hemoglobin 12.0 - 15.0 g/dL 12.7 12.7 13.5  Hematocrit 36.0 - 46.0 % 38.7 38.3 40.3  Platelets 150 - 400 K/uL 185 198.0 287.6    Basic Metabolic Panel:    Component Value Date/Time   NA 142 07/16/2021 1016   NA 142 07/13/2017 1029   NA 138 10/03/2014 0255   K 4.7 07/16/2021 1016   K 4.2 10/03/2014 0255   CL 108 07/16/2021 1016   CL 105 10/03/2014 0255   CO2 27 07/16/2021 1016   CO2 26 10/03/2014 0255   BUN 13 07/16/2021 1016   BUN 13 07/13/2017 1029   BUN 8 10/03/2014 0255   CREATININE 0.82 07/16/2021 1016   CREATININE 0.81 10/03/2014 0255   CREATININE 1.06 09/10/2012 1616   GLUCOSE 96 07/16/2021 1016   GLUCOSE 65 10/03/2014 0255   CALCIUM  9.1 07/16/2021 1016   CALCIUM 7.8 (L) 10/03/2014 0255   Hepatic Function Latest Ref Rng & Units 07/16/2021 08/12/2020 03/13/2019  Total Protein 6.5 - 8.1 g/dL 6.9 6.3 6.5  Albumin 3.5 - 5.0 g/dL 4.1 4.1 4.5  AST 15 - 41 U/L _0 ALT 0 - 44 U/L 36 25 19  Alk Phosphatase 38 - 126 U/L 71 87 78  Total Bilirubin 0.3 - 1.2 mg/dL 0.6 0.5 0.7  Bilirubin, Direct 0.0 - 0.3 mg/dL - 0.1 -    Lab Results  Component Value Date   HGBA1C 5.8 08/12/2020   Lab Results  Component Value Date   TSH 5.740 (H) 07/16/2021    No results found.  Assessment and Plan:     ICD-10-CM   1. Healthcare maintenance  Z00.00 T3, free    T4, free    TSH    2. Other specified hypothyroidism  E03.8     3. Vitamin D deficiency  E55.9 VITAMIN D 25 Hydroxy (Vit-D Deficiency, Fractures)     Thyroid - TSH, this is mildly abnormal with cardiology.  I am going to repeat this today and also repeat her free T3 and free T4.  She is up-to-date on vaccination and other health maintenance issues.  Think her primary impediment is to physical activity secondary to knee pain.  She has been plagued with this for a while.  She does have some chondrocalcinosis, but all of her prior joint aspirates have not shown CPPD crystals.   Health Maintenance Exam: The patient's preventative maintenance and  recommended screening tests for an annual wellness exam were reviewed in full today. Brought up to date unless services declined.  Counselled on the importance of diet, exercise, and its role in overall health and mortality. The patient's FH and SH was reviewed, including their home life, tobacco status, and drug and alcohol status.  Follow-up in 1 year for physical exam or additional follow-up below.  I have personally reviewed the Medicare Annual Wellness questionnaire and have noted 1. The patient's medical and social history 2. Their use of alcohol, tobacco or illicit drugs 3. Their current medications and  supplements 4. The patient's functional ability including ADL's, fall risks, home safety risks and hearing or visual             impairment. 5. Diet and physical activities 6. Evidence for depression or mood disorders 7. Reviewed Updated provider list, see scanned forms and [facility] Snapshot.  8. Reviewed whether or not the patient has HCPOA or living will, and discussed what this means with the patient.  Recommended she bring in a copy for his chart in [facility].  The patients weight, height, BMI and visual acuity have been recorded in the chart I have made referrals, counseling and provided education to the patient based review of the above and I have provided the pt with a written personalized care plan for preventive services.  I have provided the patient with a copy of your personalized plan for preventive services. Instructed to take the time to review along with their updated medication list.  Follow-up: No follow-ups on file. Or follow-up in 1 year unless noted.  Future Appointments  Date Time Provider [place]  08/25/2021 10:15 AM [facility]    No orders of the defined types were placed in this encounter.  Medications Discontinued During This Encounter  Medication Reason   cefdinir (OMNICEF) 300 MG capsule Completed Course   Orders Placed This Encounter  Procedures   T3, free   T4, free   VITAMIN D 25 Hydroxy (Vit-D Deficiency, Fractures)   TSH     Signed,  Venice Marcucci T. Safire Gordin, MD   Allergies as of 08/11/2021   No Known Allergies      Medication List        Accurate as of August 11, 2021  5:18 PM. If you have any questions, ask your nurse or doctor.          STOP taking these medications    cefdinir 300 MG capsule Commonly known as: OMNICEF Stopped by: Owens Loffler, MD       TAKE these medications    amiodarone 200 MG tablet Commonly known as: PACERONE Take 1 tablet (200 mg total) by mouth daily.   b  complex vitamins tablet Take 1 tablet daily by mouth.   CALCIUM + D PO Take 1 capsule daily by mouth.   carvedilol 3.125 MG tablet Commonly known as: COREG TAKE 1 TABLET BY MOUTH TWICE A DAY   cetirizine 10 MG tablet Commonly known as: ZYRTEC Take 10 mg by mouth daily.   donepezil 5 MG tablet Commonly known as: ARICEPT TAKE 1 TABLET BY MOUTH DAILY AT BEDTIME   FISH OIL PO Take 1 capsule by mouth daily.   fluticasone 50 MCG/ACT nasal spray Commonly known as: FLONASE PLACE TWO SPRAYS INTO BOTH NOSTRILS DAILY   IRON (FERROUS SULFATE) PO Take 1 tablet by mouth daily.   levothyroxine 50 MCG tablet Commonly known as: SYNTHROID TAKE 1 TAB BY MOUTH ONCE DAILY. TAKE  ON AN EMPTY STOMACH WITH A GLASS OF WATER ATLEAST 30-60 MINUTES BEFORE BREAKFAST   losartan 25 MG tablet Commonly known as: COZAAR Take 1 tablet (25 mg total) by mouth daily.   metroNIDAZOLE 0.75 % cream Commonly known as: METROCREAM Apply topically 2 (two) times daily.   permethrin 5 % cream Commonly known as: ELIMITE APPLY PEA SIZED AMOUNT TO FACE ONCE A DAY   traZODone 50 MG tablet Commonly known as: DESYREL TAKE 1/2 TO 1 TABLET BY MOUTH AT BEDTIME AS NEEDED FOR SLEEP   warfarin 5 MG tablet Commonly known as: COUMADIN Take as directed by the anticoagulation clinic. If you are unsure how to take this medication, talk to your nurse or doctor. Original instructions: TAKE 1/2 TO 1 TABLET BY MOUTH DAILY AS DIRECTED BY COUMADIN CLINIC

## 2021-08-12 LAB — TSH: TSH: 6.04 u[IU]/mL — ABNORMAL HIGH (ref 0.35–5.50)

## 2021-08-12 LAB — VITAMIN D 25 HYDROXY (VIT D DEFICIENCY, FRACTURES): VITD: 72.2 ng/mL (ref 30.00–100.00)

## 2021-08-12 LAB — T4, FREE: Free T4: 0.84 ng/dL (ref 0.60–1.60)

## 2021-08-12 LAB — T3, FREE: T3, Free: 2.9 pg/mL (ref 2.3–4.2)

## 2021-08-16 ENCOUNTER — Other Ambulatory Visit: Payer: Self-pay | Admitting: Family Medicine

## 2021-08-16 DIAGNOSIS — E039 Hypothyroidism, unspecified: Secondary | ICD-10-CM

## 2021-08-16 MED ORDER — LEVOTHYROXINE SODIUM 75 MCG PO TABS: ORAL_TABLET | ORAL | 3 refills | Status: DC

## 2021-08-16 NOTE — Addendum Note (Signed)
Addended by: Owens Loffler on: 08/16/2021 10:42 AM   Modules accepted: Orders

## 2021-08-17 ENCOUNTER — Ambulatory Visit: Payer: Medicare HMO

## 2021-08-19 ENCOUNTER — Other Ambulatory Visit: Payer: Self-pay

## 2021-08-19 ENCOUNTER — Telehealth: Payer: Self-pay | Admitting: Family Medicine

## 2021-08-19 ENCOUNTER — Telehealth (INDEPENDENT_AMBULATORY_CARE_PROVIDER_SITE_OTHER): Payer: Medicare HMO | Admitting: Family

## 2021-08-19 ENCOUNTER — Encounter: Payer: Self-pay | Admitting: Family

## 2021-08-19 VITALS — BP 140/85 | HR 58 | Temp 96.7°F | Ht 67.0 in | Wt 163.0 lb

## 2021-08-19 DIAGNOSIS — R051 Acute cough: Secondary | ICD-10-CM | POA: Insufficient documentation

## 2021-08-19 DIAGNOSIS — J069 Acute upper respiratory infection, unspecified: Secondary | ICD-10-CM | POA: Insufficient documentation

## 2021-08-19 MED ORDER — AMOXICILLIN-POT CLAVULANATE 875-125 MG PO TABS: 1.0000 | ORAL_TABLET | Freq: Two times a day (BID) | ORAL | 0 refills | Status: DC

## 2021-08-19 MED ORDER — BENZONATATE 200 MG PO CAPS: 200.0000 mg | ORAL_CAPSULE | Freq: Two times a day (BID) | ORAL | 0 refills | Status: AC | PRN

## 2021-08-19 NOTE — Telephone Encounter (Signed)
Noted thank you

## 2021-08-19 NOTE — Assessment & Plan Note (Addendum)
Take antibiotic as prescribed. Increase oral fluids. Pt to f/u if sx worsen and or fail to improve in 2-3 days. Advised patient that since she is on Coumadin therapy she will have to advised her provider that monitors her INR that she is starting on an antibiotic.

## 2021-08-19 NOTE — Telephone Encounter (Signed)
Can you call and ask her to get a home Covid test?  She needs to know if she has Covid, so she does not give her family members Covid during Christmas.

## 2021-08-19 NOTE — Telephone Encounter (Signed)
[name],  Was patient advised to do a home Covid Test?

## 2021-08-19 NOTE — Patient Instructions (Signed)
Antibiotic sent to preferred pharmacy.   Please increase oral fluids, steamy hot shower/humidifier prn.  Please follow up if no improvement in 2-3 days.   It was a pleasure seeing you today! Please do not hesitate to reach out with any questions and or concerns.  Regards,   Sahvanna Mcmanigal   

## 2021-08-19 NOTE — Telephone Encounter (Signed)
I spoke with pt and starting on 08/12/21 pt had head congestion, runny nose, H/A, and prod cough with yellow phlegm, the prod cough continues but now the phlegm is a  cloudy white.pt has been taking Nyquil and tylenol; pt is not having fever, no CP or SOB; no H/A now and no S/T. Pt has not lost taste or smell. Pt is in no distress and basically wanted abx given last time. I explained that pt would need to have a visit and pt said she had a bad experience trying to get to [facility] for a visit before and does not want to do that. No available appts at [facility] in [place]. Pt does not want to go to UC and scheduled video visit with Eugenia Pancoast FNP today at 3:20. Pt has not recently cked a covid test and pt does not have any covid home test. UC & ED precautions given and pt voiced understanding. Pt will have vitals ready when CMA calls. Sending note to Eugenia Pancoast FNP and [name] CMA. And Dr Lorelei Pont as Juluis Rainier as PCP.

## 2021-08-19 NOTE — Telephone Encounter (Signed)
Pt is calling wanting a call back wanting to talk about how sick she is feeling. [phone]

## 2021-08-19 NOTE — Assessment & Plan Note (Signed)
Tessalon Perles sent to the pharmacy for prescription.  Patient to take throughout the day as this is not sedating patient can continue with the nighttime over-the-counter medications however advised patient that he had if it has the DM and it may raise her blood pressure, I prefer she use over-the-counter Coricidin.

## 2021-08-19 NOTE — Telephone Encounter (Signed)
Please triage

## 2021-08-19 NOTE — Progress Notes (Addendum)
MyChart Video Visit    Virtual Visit via Video Note   This visit type was conducted due to national recommendations for restrictions regarding the COVID-19 Pandemic (e.g. social distancing) in an effort to limit this patient's exposure and mitigate transmission in our community. This patient is at least at moderate risk for complications without adequate follow up. This format is felt to be most appropriate for this patient at this time. Physical exam was limited by quality of the video and audio technology used for the visit. CMA was able to get the patient set up on a video visit.  Patient location: Home. Patient and provider in visit Provider location: Office  I discussed the limitations of evaluation and management by telemedicine and the availability of in person appointments. The patient expressed understanding and agreed to proceed.  Visit Date: 08/19/2021  Today's healthcare provider: Eugenia Pancoast, FNP     Subjective:    Patient ID: [name], female    DOB: 06/20/1946, 75 y.o.   MRN: [id number]  Chief Complaint  Patient presents with   Hemoptysis   Nasal Congestion    HPI  Pt here with c/o 7 week h/o nasal congestion, achy, lots of mucous, fever up to 102 which has resolved, and started with a fever blister on her upper lip (which has since improved). Cough wet productive, taking nyquil pm which is helping her to sleep.  No sore throat or ear pain.   Did not test herself for covid, she doesn't have one at home and she doesn't want to make the drive to be tested.   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[place]) 09/29/2014   a) [place] Echo: EF ~10% Severe LV dilation & global LV Systolic dysfxn, restrictive filling pattern - Gr 3 DD, mod RV dysfxn, severe LA dilation - 6.4 cm, mod RA dil, mod pl effusion, mod MR, mild TR; b) TEE 10/07/14: EF ~10%, Severe LV dilation & dysfxn Mod RV dysfxn, LAA oversewn, Mod RA & TR   Diverticulosis    H/O: rheumatic fever    HYPERLIPIDEMIA 04/05/2010   Qualifier: Diagnosis of  By: Lorelei Pont MD, [place]     Hypothyroid    Migraine headache    Osteopenia    Paroxysmal atrial fibrillation ([place]) 2005; Recurrent 09/2014   a) 2005: s/p Cox Maze & LAA ligation; b) 09/2014: TEE-cardioversion   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  Posterior MV leaflet is fixed and immobile.  MAC.  reduced excursion of the anterior MV leaflet.  Mean MV gradient was 41m Hg and MVA calculated at cm2.      Urinary incontinence  Past Surgical History:  Procedure Laterality Date   ABDOMINAL HYSTERECTOMY     CARDIAC CATHETERIZATION  Jan 2005   Pre-op R&LHC -- Nonobstructive CAD; normal PA pressures   CARDIOVERSION  06/04/2012   Procedure: CARDIOVERSION;  Surgeon: Carlena Bjornstad, MD;  Location: [facility];  Service: Cardiovascular;  Laterality: N/A;   CARDIOVERSION N/A 10/07/2014   Procedure: CARDIOVERSION;  Surgeon: Sueanne Margarita, MD;  Location: [facility];  Service: Cardiovascular;  Laterality: N/A;   COLONOSCOPY WITH PROPOFOL N/A 07/11/2017   Procedure: COLONOSCOPY WITH PROPOFOL;  Surgeon: Toledo, Benay Pike, MD;  Location: [facility]  ENDOSCOPY;  Service: Gastroenterology;  Laterality: N/A;   LUMBAR DISC SURGERY     x 2 distantly   MITRAL VALVE REPAIR  2005   with [facility] Maze for [employer]   NM MYOVIEW LTD  4/7/'16   EF 37% with septal hypokinesis and diffuse hypokinesis. No ischemia or infarction. 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   RIGHT HEART CATHETERIZATION N/A 10/03/2014   Procedure: RIGHT HEART CATH;  Surgeon: Jolaine Artist, MD;  Location[facility];  Service: Cardiovascular;  RAP 71mHg, RVP 35/2/9 mmHg, PAP 45/12 mmHg, PCWP 16 mmHg; CO/I by Fick: 3.9/2.3; Ao/PA/SVC SaO2%: 97%/63%/65%.   TEE WITHOUT CARDIOVERSION N/A 10/07/2014   Procedure: TRANSESOPHAGEAL ECHOCARDIOGRAM (TEE);  Surgeon: TSueanne Margarita MD;  [facility]  Service: Cardiovascular;  Laterality: N/A;   THYROIDECTOMY, PARTIAL     partial-no cancer; now on thyroid replacement   TRANSTHORACIC ECHOCARDIOGRAM  2013; 2/'16 & 5/'16   a) 2013: EF 60-65%, mild MR, Gr 2 DD; b) [place] [facility] [place] ~10% - severel global LV dysfunction (systolic & diastolic) - dilated LV & restrictive filling pattern - Gr 3 DD, mod reduced RV function, severely dilated LA - 6.4 cm, mod dilated RA, mod pl effusion, mod MR, mild TR; c) 5/10/'16:  EF 25-30%, mod LV dilation, no RWMA,Gr 3 DD w/ high LAP, MV sewing ring intact w/ Mod MR, Severe LA dilation   TRANSTHORACIC ECHOCARDIOGRAM  January 2017:   EF improved to 45-50%. Mild diffuse HK. Mild MR. Moderate LA dilation. Normal PA pressures.    Family History  Problem Relation Age of Onset   Diabetes Mother    Coronary artery disease Mother    Kidney disease Mother    Breast cancer Neg Hx     Social History   Socioeconomic History   Marital status: Married    Spouse name: Not on file   Number of children: 2   Years of education: Not on file   Highest education level: Not on file  Occupational History   Occupation: Retired  Tobacco Use   Smoking status:  Never   Smokeless tobacco: Never  Vaping Use   Vaping Use: Never used  Substance and Sexual Activity   Alcohol use: Not Currently   Drug use: No   Sexual activity: Yes  Other Topics Concern   Not on file  Social History Narrative   Not on file   Social Determinants of Health   Financial Resource Strain: Not on file  Food Insecurity: Not on file  Transportation Needs: Not on file  Physical Activity: Not on file  Stress: Not on file  Social Connections: Not on file  Intimate Partner Violence: Not on file    Outpatient Medications Prior to Visit  Medication Sig Dispense Refill   amiodarone (PACERONE) 200 MG tablet Take 1 tablet (200 mg total) by mouth  daily. 30 tablet 11   b complex vitamins tablet Take 1 tablet daily by mouth.     Calcium Citrate-Vitamin D (CALCIUM + D PO) Take 1 capsule daily by mouth.      carvedilol (COREG) 3.125 MG tablet TAKE 1 TABLET BY MOUTH TWICE A DAY 180 tablet 3   cetirizine (ZYRTEC) 10 MG tablet Take 10 mg by mouth daily.     donepezil (ARICEPT) 5 MG tablet TAKE 1 TABLET BY MOUTH DAILY AT BEDTIME 90 tablet 3   fluticasone (FLONASE) 50 MCG/ACT nasal spray PLACE TWO SPRAYS INTO BOTH NOSTRILS DAILY 48 g 1   IRON, FERROUS SULFATE, PO Take 1 tablet by mouth daily.     levothyroxine (SYNTHROID) 75 MCG tablet TAKE 1 TAB BY MOUTH ONCE DAILY. TAKE ON AN EMPTY STOMACH WITH A GLASS OF WATER ATLEAST 30-60 MINUTES BEFORE BREAKFAST 30 tablet 3   losartan (COZAAR) 25 MG tablet Take 1 tablet (25 mg total) by mouth daily. 90 tablet 3   metroNIDAZOLE (METROCREAM) 0.75 % cream Apply topically 2 (two) times daily. 45 g 5   Omega-3 Fatty Acids (FISH OIL PO) Take 1 capsule by mouth daily.     permethrin (ELIMITE) 5 % cream APPLY PEA SIZED AMOUNT TO FACE ONCE A DAY  1   traZODone (DESYREL) 50 MG tablet TAKE 1/2 TO 1 TABLET BY MOUTH AT BEDTIME AS NEEDED FOR SLEEP 30 tablet 5   warfarin (COUMADIN) 5 MG tablet TAKE 1/2 TO 1 TABLET BY MOUTH DAILY AS DIRECTED BY COUMADIN CLINIC  90 tablet 1   No facility-administered medications prior to visit.    No Known Allergies  Review of Systems  Constitutional:  Positive for fever. Negative for chills.  HENT:  Positive for congestion. Negative for ear pain, sinus pressure and sore throat.   Respiratory:  Positive for cough (wet productive cough). Negative for shortness of breath and wheezing.   Cardiovascular:  Negative for chest pain and palpitations.      Objective:    Physical Exam Neurological:     General: No focal deficit present.     Mental Status: She is alert and oriented to person, place, and time.  Psychiatric:        Mood and Affect: Mood normal.        Behavior: Behavior normal.        Thought Content: Thought content normal.        Judgment: Judgment normal.    BP 140/85    Pulse (!) 58    Temp (!) 96.7 F (35.9 C) (Oral)    Ht _0  (1.702 m)    Wt 163 lb (73.9 kg)    BMI 25.53 kg/m  Wt Readings from Last 3 Encounters:  08/19/21 163 lb (73.9 kg)  08/11/21 167 lb 4 oz (75.9 kg)  07/16/21 164 lb 4 oz (74.5 kg)       Assessment & Plan:   Problem List Items Addressed This Visit       Respiratory   Upper respiratory infection, acute    Take antibiotic as prescribed. Increase oral fluids. Pt to f/u if sx worsen and or fail to improve in 2-3 days. Advised patient that since she is on Coumadin therapy she will have to advised her provider that monitors her INR that she is starting on an antibiotic.         Other   Acute cough - Primary    Tessalon Perles sent to the pharmacy for prescription.  Patient  to take throughout the day as this is not sedating patient can continue with the nighttime over-the-counter medications however advised patient that he had if it has the DM and it may raise her blood pressure, I prefer she use over-the-counter Coricidin.      Relevant Medications   benzonatate (TESSALON) 200 MG capsule   amoxicillin-clavulanate (AUGMENTIN) 875-125 MG tablet    I am  having [name] start on benzonatate and amoxicillin-clavulanate. I am also having her maintain her Omega-3 Fatty Acids (FISH OIL PO), Calcium Citrate-Vitamin D (CALCIUM + D PO), (IRON, FERROUS SULFATE, PO), b complex vitamins, permethrin, metroNIDAZOLE, losartan, carvedilol, fluticasone, traZODone, warfarin, donepezil, cetirizine, amiodarone, and levothyroxine.  Meds ordered this encounter  Medications   benzonatate (TESSALON) 200 MG capsule    Sig: Take 1 capsule (200 mg total) by mouth 2 (two) times daily as needed for up to 10 days for cough.    Dispense:  20 capsule    Refill:  0    Order Specific Question:   Supervising Provider    Answer:   BEDSOLE, AMY E [[id number]]   amoxicillin-clavulanate (AUGMENTIN) 875-125 MG tablet    Sig: Take 1 tablet by mouth 2 (two) times daily.    Dispense:  20 tablet    Refill:  0    Order Specific Question:   Supervising Provider    Answer:   BEDSOLE, AMY E [[id number]]    I discussed the assessment and treatment plan with the patient. The patient was provided an opportunity to ask questions and all were answered. The patient agreed with the plan and demonstrated an understanding of the instructions.   The patient was advised to call back or seek an in-person evaluation if the symptoms worsen or if the condition fails to improve as anticipated.  I provided 18 minutes of face-to-face time during this encounter.   Eugenia Pancoast, [place] at [place] [phone] (phone) [phone] (fax)  [employer]

## 2021-08-20 NOTE — Telephone Encounter (Signed)
Patient called and informed of recommendations. Patient stated understanding but stated that they didn't plan on getting together this weekend. Patient stated appreciation for the call.

## 2021-08-20 NOTE — Telephone Encounter (Signed)
Please call pt and advise her if she is going to be around family this weekend, she needs to take a covid test to verify she is not positive to covid.

## 2021-08-30 ENCOUNTER — Other Ambulatory Visit: Payer: Self-pay | Admitting: Family Medicine

## 2021-09-01 ENCOUNTER — Ambulatory Visit (INDEPENDENT_AMBULATORY_CARE_PROVIDER_SITE_OTHER): Payer: Medicare HMO

## 2021-09-01 ENCOUNTER — Other Ambulatory Visit: Payer: Self-pay

## 2021-09-01 DIAGNOSIS — I4891 Unspecified atrial fibrillation: Secondary | ICD-10-CM

## 2021-09-01 DIAGNOSIS — Z7901 Long term (current) use of anticoagulants: Secondary | ICD-10-CM

## 2021-09-01 DIAGNOSIS — Z5181 Encounter for therapeutic drug level monitoring: Secondary | ICD-10-CM

## 2021-09-01 LAB — POCT INR: INR: 2.6 (ref 2.0–3.0)

## 2021-09-01 NOTE — Patient Instructions (Signed)
-   continue dosage of warfarin 1 tablet daily EXCEPT 1/2 tablet on MONDAYS & FRIDAYS. - recheck in 6 weeks.  

## 2021-09-07 ENCOUNTER — Other Ambulatory Visit: Payer: Self-pay | Admitting: Family Medicine

## 2021-09-21 ENCOUNTER — Other Ambulatory Visit: Payer: Self-pay | Admitting: Family Medicine

## 2021-09-22 ENCOUNTER — Other Ambulatory Visit: Payer: Medicare HMO

## 2021-09-23 ENCOUNTER — Other Ambulatory Visit: Payer: Self-pay | Admitting: Family Medicine

## 2021-09-27 ENCOUNTER — Other Ambulatory Visit: Payer: Medicare HMO

## 2021-09-27 ENCOUNTER — Other Ambulatory Visit: Payer: Self-pay

## 2021-09-27 ENCOUNTER — Other Ambulatory Visit (INDEPENDENT_AMBULATORY_CARE_PROVIDER_SITE_OTHER): Payer: Medicare HMO

## 2021-09-27 DIAGNOSIS — E039 Hypothyroidism, unspecified: Secondary | ICD-10-CM

## 2021-09-28 LAB — T3, FREE: T3, Free: 2.7 pg/mL (ref 2.3–4.2)

## 2021-09-28 LAB — TSH: TSH: 2.96 u[IU]/mL (ref 0.35–5.50)

## 2021-09-28 LAB — T4, FREE: Free T4: 0.92 ng/dL (ref 0.60–1.60)

## 2021-10-13 ENCOUNTER — Ambulatory Visit (INDEPENDENT_AMBULATORY_CARE_PROVIDER_SITE_OTHER): Payer: Medicare HMO

## 2021-10-13 ENCOUNTER — Other Ambulatory Visit: Payer: Self-pay

## 2021-10-13 DIAGNOSIS — Z7901 Long term (current) use of anticoagulants: Secondary | ICD-10-CM

## 2021-10-13 DIAGNOSIS — Z5181 Encounter for therapeutic drug level monitoring: Secondary | ICD-10-CM

## 2021-10-13 DIAGNOSIS — I4891 Unspecified atrial fibrillation: Secondary | ICD-10-CM | POA: Diagnosis not present

## 2021-10-13 LAB — POCT INR: INR: 2.8 (ref 2.0–3.0)

## 2021-10-13 NOTE — Patient Instructions (Signed)
-   continue dosage of warfarin 1 tablet daily EXCEPT 1/2 tablet on MONDAYS & FRIDAYS. - recheck in 6 weeks.  

## 2021-10-14 ENCOUNTER — Other Ambulatory Visit: Payer: Self-pay | Admitting: Family Medicine

## 2021-10-14 DIAGNOSIS — Z1231 Encounter for screening mammogram for malignant neoplasm of breast: Secondary | ICD-10-CM

## 2021-10-28 ENCOUNTER — Telehealth: Payer: Self-pay

## 2021-10-28 NOTE — Telephone Encounter (Signed)
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 She wanted to have treatment called in. States that she has had this happen in the past. Is not having  any symptoms.  ?

## 2021-10-28 NOTE — Telephone Encounter (Signed)
Can you call her. ? ?1.  She is on both Warfarin and Amiodarone which have more interactions than virtually all other medications, and I will not treat this without a face-to-face encounter and doing additional stool studies to absolutely confirm.  Without very clear confirmation, the potential risk is high, particularly when she is not having any symptoms. ?

## 2021-10-28 NOTE — Telephone Encounter (Signed)
Mrs. Wassmer notified as instructed by telephone.  Appointment scheduled for 11/03/21 at 9:40 am with Dr. Lorelei Pont.  ?

## 2021-11-01 ENCOUNTER — Encounter: Payer: Self-pay | Admitting: Family Medicine

## 2021-11-01 ENCOUNTER — Other Ambulatory Visit: Payer: Self-pay

## 2021-11-01 ENCOUNTER — Ambulatory Visit (INDEPENDENT_AMBULATORY_CARE_PROVIDER_SITE_OTHER): Payer: Medicare HMO | Admitting: Family Medicine

## 2021-11-01 VITALS — BP 150/72 | HR 58 | Temp 97.6°F | Ht 67.0 in | Wt 172.2 lb

## 2021-11-01 DIAGNOSIS — B89 Unspecified parasitic disease: Secondary | ICD-10-CM | POA: Diagnosis not present

## 2021-11-01 NOTE — Progress Notes (Signed)
? ? ?[name] T. Deborah Burgard, MD, [facility] ?Therapist, music at [facility] ?[place] ?[place], [address] ? ?Phone: [phone]  FAX: [phone] ? ?[name] - 76 y.o. female  MRN [id number]  Date of Birth: 11-18-1945 ? ?Date: 11/01/2021  PCP: Owens Loffler, MD  Referral: Owens Loffler, MD ? ?Chief Complaint  ?Patient presents with  ? Follow-up  ?  +Giardia stool test done at [facility]  ? ? ?This visit occurred during the SARS-CoV-2 public health emergency.  Safety protocols were in place, including screening questions prior to the visit, additional usage of staff PPE, and extensive cleaning of exam room while observing appropriate contact time as indicated for disinfecting solutions.  ? ?Subjective:  ? ?[name] is a 76 y.o. very pleasant female patient with Body mass index is 26.98 kg/m?. who presents with the following: ? ?A veterinarian did a giardia test on her.  On microscopy, they did find some Giardia in her dog, so a Physicist, medical tested her stool and felt like there was likely Giardia as well. ? ?Patient is on amiodarone and warfarin, so I asked her to come in to have a confirmatory test prior to placing her on antiparasitic medication.  She is completely asymptomatic. ? ?No nausea, gas.  No changes.  No changes at all in the stool. ? ?Review of Systems is noted in the HPI, as appropriate ? ?Objective:  ? ?BP (!) 150/72   Pulse (!) 58   Temp 97.6 ?F (36.4 ?C) (Temporal)   Ht '5\' 7"'$  (1.702 m)   Wt 172 lb 4 oz (78.1 kg)   SpO2 98%   BMI 26.98 kg/m?  ? ?GEN: No acute distress; alert,appropriate. ?PULM: Breathing comfortably in no respiratory distress ?PSYCH: Normally interactive.  ? ?Laboratory and Imaging Data: ? ?Assessment and Plan:  ? ?  ICD-10-CM   ?1. Stained microscopy positive for parasite  B89 Giardia antigen  ?  Ova and Parasite Examination  ?  ? ?Check for parasite.  If there is no Giardia, do not treat. ? ?No orders of the defined types  were placed in this encounter. ? ?Medications Discontinued During This Encounter  ?Medication Reason  ? amoxicillin-clavulanate (AUGMENTIN) 875-125 MG tablet Completed Course  ? ?Orders Placed This Encounter  ?Procedures  ? Giardia antigen  ? Ova and Parasite Examination  ? ? ?Follow-up: No follow-ups on file. ? ?Dragon [employer] One speech-to-text software was used for transcription in this dictation.  Possible transcriptional errors can occur using Editor, commissioning.  ? ?Signed, ? ?Richie Vadala T. Chetara Kropp, MD ? ? ?Outpatient Encounter Medications as of 11/01/2021  ?Medication Sig  ? amiodarone (PACERONE) 200 MG tablet Take 1 tablet (200 mg total) by mouth daily.  ? b complex vitamins tablet Take 1 tablet daily by mouth.  ? Calcium Citrate-Vitamin D (CALCIUM + D PO) Take 1 capsule daily by mouth.   ? carvedilol (COREG) 3.125 MG tablet TAKE 1 TABLET BY MOUTH TWICE A DAY  ? cetirizine (ZYRTEC) 10 MG tablet Take 10 mg by mouth daily.  ? donepezil (ARICEPT) 5 MG tablet TAKE 1 TABLET BY MOUTH DAILY AT BEDTIME  ? fluticasone (FLONASE) 50 MCG/ACT nasal spray PLACE TWO SPRAYS INTO BOTH NOSTRILS DAILY  ? IRON, FERROUS SULFATE, PO Take 1 tablet by mouth daily.  ? levothyroxine (SYNTHROID) 75 MCG tablet TAKE 1 TAB BY MOUTH ONCE DAILY. TAKE ON AN EMPTY STOMACH WITH A GLASS OF WATER ATLEAST 30-60 MINUTES BEFORE BREAKFAST  ? losartan (  COZAAR) 25 MG tablet TAKE 1 TABLET BY MOUTH ONCE DAILY  ? metroNIDAZOLE (METROCREAM) 0.75 % cream Apply topically 2 (two) times daily.  ? Omega-3 Fatty Acids (FISH OIL PO) Take 1 capsule by mouth daily.  ? permethrin (ELIMITE) 5 % cream APPLY PEA SIZED AMOUNT TO FACE ONCE A DAY  ? traZODone (DESYREL) 50 MG tablet TAKE ONE HALF TO ONE TABLET BY MOUTH AT BEDTIME AS NEEDED FOR SLEEP  ? warfarin (COUMADIN) 5 MG tablet TAKE 1/2 TO 1 TABLET BY MOUTH DAILY AS DIRECTED BY COUMADIN CLINIC  ? [DISCONTINUED] amoxicillin-clavulanate (AUGMENTIN) 875-125 MG tablet Take 1 tablet by mouth 2 (two) times daily.  ? ?No  facility-administered encounter medications on file as of 11/01/2021.  ?  ?

## 2021-11-03 ENCOUNTER — Ambulatory Visit: Payer: Medicare HMO | Admitting: Family Medicine

## 2021-11-04 LAB — OVA AND PARASITE EXAMINATION
CONCENTRATE RESULT:: NONE SEEN
MICRO NUMBER:: 13092052
SPECIMEN QUALITY:: ADEQUATE
TRICHROME RESULT:: NONE SEEN

## 2021-11-04 LAB — GIARDIA ANTIGEN
MICRO NUMBER:: 13092051
RESULT:: NOT DETECTED
SPECIMEN QUALITY:: ADEQUATE

## 2021-11-19 ENCOUNTER — Other Ambulatory Visit: Payer: Self-pay

## 2021-11-19 ENCOUNTER — Ambulatory Visit
Admission: RE | Admit: 2021-11-19 | Discharge: 2021-11-19 | Disposition: A | Discharge status: Home / Self Care | Payer: Medicare HMO | Source: Ambulatory Visit | Attending: Family Medicine | Admitting: Family Medicine

## 2021-11-19 DIAGNOSIS — Z1231 Encounter for screening mammogram for malignant neoplasm of breast: Secondary | ICD-10-CM | POA: Diagnosis present

## 2021-11-24 ENCOUNTER — Ambulatory Visit (INDEPENDENT_AMBULATORY_CARE_PROVIDER_SITE_OTHER): Payer: Medicare HMO

## 2021-11-24 ENCOUNTER — Other Ambulatory Visit: Payer: Self-pay

## 2021-11-24 DIAGNOSIS — Z5181 Encounter for therapeutic drug level monitoring: Secondary | ICD-10-CM

## 2021-11-24 DIAGNOSIS — I4891 Unspecified atrial fibrillation: Secondary | ICD-10-CM

## 2021-11-24 DIAGNOSIS — Z7901 Long term (current) use of anticoagulants: Secondary | ICD-10-CM | POA: Diagnosis not present

## 2021-11-24 LAB — POCT INR: INR: 2.7 (ref 2.0–3.0)

## 2021-11-24 NOTE — Patient Instructions (Signed)
-   continue dosage of warfarin 1 tablet daily EXCEPT 1/2 tablet on MONDAYS & FRIDAYS. - recheck in 6 weeks.  

## 2021-11-29 ENCOUNTER — Ambulatory Visit (INDEPENDENT_AMBULATORY_CARE_PROVIDER_SITE_OTHER): Payer: Medicare HMO | Admitting: Family Medicine

## 2021-11-29 VITALS — BP 130/70 | HR 52 | Temp 98.2°F | Ht 67.0 in | Wt 171.3 lb

## 2021-11-29 DIAGNOSIS — R198 Other specified symptoms and signs involving the digestive system and abdomen: Secondary | ICD-10-CM | POA: Diagnosis not present

## 2021-11-29 DIAGNOSIS — N393 Stress incontinence (female) (male): Secondary | ICD-10-CM | POA: Diagnosis not present

## 2021-11-29 DIAGNOSIS — R1084 Generalized abdominal pain: Secondary | ICD-10-CM | POA: Diagnosis not present

## 2021-11-29 DIAGNOSIS — R159 Full incontinence of feces: Secondary | ICD-10-CM

## 2021-11-29 DIAGNOSIS — D369 Benign neoplasm, unspecified site: Secondary | ICD-10-CM

## 2021-11-29 DIAGNOSIS — R109 Unspecified abdominal pain: Secondary | ICD-10-CM

## 2021-11-29 DIAGNOSIS — R1900 Intra-abdominal and pelvic swelling, mass and lump, unspecified site: Secondary | ICD-10-CM

## 2021-11-29 DIAGNOSIS — Z1211 Encounter for screening for malignant neoplasm of colon: Secondary | ICD-10-CM

## 2021-11-29 NOTE — Progress Notes (Signed)
? ? ?Deborah Shortridge T. Evelyn Moch, MD, [facility] ?Therapist, music at [facility] ?[place] ?[place], [address] ? ?Phone: [phone]  FAX: [phone] ? ?[name] - 76 y.o. female  MRN [id number]  Date of Birth: 10/18/1945 ? ?Date: 11/29/2021  PCP: Owens Loffler, MD  Referral: Owens Loffler, MD ? ?Chief Complaint  ?Patient presents with  ?? Pain under left rib cage  ?  Started  happening sporadically 2 months ago but has gotten worse  ? ? ?This visit occurred during the SARS-CoV-2 public health emergency.  Safety protocols were in place, including screening questions prior to the visit, additional usage of staff PPE, and extensive cleaning of exam room while observing appropriate contact time as indicated for disinfecting solutions.  ? ?Subjective:  ? ?[name] is a 76 y.o. very pleasant female patient with Body mass index is 26.83 kg/m?. who presents with the following: ? ?L side and bak are hurting.  She has had some ongoing intermittent chronic back pain for years.  Right now she is having pain in the abdomen, not in the region of her ribs.  Inferior, but she is eating and drinking okay.  She is not having any COVID acute focal pain. ? ?She does feel a fullness in her abdomen, and feels as if some of her organs are moving with even basic movement and breathing. ? ?She also has had some intermittent fecal incontinence, and this is not new.  This is observed when she is passing gas.  She does wear a pad.  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  She has seen urology in the past about this, and they have not really been any great solutions. ? ? ?06/2017 - adenomatous polyp, no follow-up. ? ?Feels some pressure in the abdomen.  ?Urology has seen her  ? ?Sometimes will hav esome fecal incontience with passing gas ?Stool will be brown and a little bit loose. ?Light brown color. ? ?Health Maintenance  ?Topic Date Due  ?? INFLUENZA  VACCINE  03/29/2022  ?? TETANUS/TDAP  02/26/2029  ?? Pneumonia Vaccine 9+ Years old  Completed  ?? DEXA SCAN  Completed  ?? COVID-19 Vaccine  Completed  ?? Hepatitis C Screening  Completed  ?? Zoster Vaccines- Shingrix  Completed  ?? HPV VACCINES  Aged Out  ?? COLONOSCOPY (Pts 45-77yr Insurance coverage will need to be confirmed)  Discontinued  ?  ? ?Review of Systems is noted in the HPI, as appropriate ? ?Objective:  ? ?BP 130/70   Pulse (!) 52   Temp 98.2 ?F (36.8 ?C) (Oral)   Ht '5\' 7"'$  (1.702 m)   Wt 171 lb 5 oz (77.7 kg)   SpO2 99%   BMI 26.83 kg/m?  ? ?GEN: No acute distress; alert,appropriate. ?PULM: Breathing comfortably in no respiratory distress ?PSYCH: Normally interactive.  ?Negative barrel sign, anterior sternum is nontender.  Clavicles are nontender.  Humerus is nontender.  She does have some tenderness in minimal pain on the left side of the ribs, but there is not focal tenderness. ?She does have some global tenderness in the upper and left upper quadrants of the abdomen. ?There is no CVA tenderness. ?Abdomen: Soft, minimally tender left upper quadrant, left lower quadrant, with some fullness in the upper abdomen.  No rebound. ? ?Laboratory and Imaging Data: ?Results for orders placed or performed in visit on 11/29/21  ?Basic metabolic panel  ?Result Value Ref Range  ? Sodium  141 135 - 145 mEq/L  ? Potassium 4.4 3.5 - 5.1 mEq/L  ? Chloride 106 96 - 112 mEq/L  ? CO2 28 19 - 32 mEq/L  ? Glucose, Bld 89 70 - 99 mg/dL  ? BUN 17 6 - 23 mg/dL  ? Creatinine, Ser 1.28 (H) 0.40 - 1.20 mg/dL  ? GFR 40.50 (L) >60.00 mL/min  ? Calcium 9.1 8.4 - 10.5 mg/dL  ?Hepatic function panel  ?Result Value Ref Range  ? Total Bilirubin 0.6 0.2 - 1.2 mg/dL  ? Bilirubin, Direct 0.1 0.0 - 0.3 mg/dL  ? Alkaline Phosphatase 70 39 - 117 U/L  ? AST 19 0 - 37 U/L  ? ALT 16 0 - 35 U/L  ? Total Protein 6.5 6.0 - 8.3 g/dL  ? Albumin 4.3 3.5 - 5.2 g/dL  ?Lipase  ?Result Value Ref Range  ? Lipase 32.0 11.0 - 59.0 U/L  ?   ? ?Assessment and Plan:  ? ?  ICD-10-CM   ?1. Abdominal fullness  X44.8 Basic metabolic panel  ?  Hepatic function panel  ?  Lipase  ?  CT Abdomen Pelvis W Contrast  ?  Ambulatory referral to Gastroenterology  ?  ?2. Generalized abdominal pain  J85.63 Basic metabolic panel  ?  Hepatic function panel  ?  Lipase  ?  CT Abdomen Pelvis W Contrast  ?  Ambulatory referral to Gastroenterology  ?  ?3. Urine, incontinence, stress female  J49.7 Basic metabolic panel  ?  Hepatic function panel  ?  Lipase  ?  CT Abdomen Pelvis W Contrast  ?  Ambulatory referral to Gastroenterology  ?  ?4. Fecal soiling due to fecal incontinence  W26.3 Basic metabolic panel  ?  Hepatic function panel  ?  Lipase  ?  CT Abdomen Pelvis W Contrast  ?  Ambulatory referral to Gastroenterology  ?  ?5. Left flank pain  Z85.8 Basic metabolic panel  ?  Hepatic function panel  ?  Lipase  ?  CT Abdomen Pelvis W Contrast  ?  Ambulatory referral to Gastroenterology  ?  ?6. Abdominal mass, unspecified abdominal location  R19.00 CT Abdomen Pelvis W Contrast  ?  Ambulatory referral to Gastroenterology  ?  ?7. Adenomatous polyp  D36.9 Ambulatory referral to Gastroenterology  ?  ?8. Screening for malignant neoplasm of colon  Z12.11 Ambulatory referral to Gastroenterology  ?  ? ?I am honestly not very concerned about the rib pain.  No concern for fracture.  Costochondritis versus MSK rib pain. ? ?The patient has a subjective feeling of some abdominal mass.  She can feel this, and had somewhat difficult time palpating this myself, but there is also some body habitus limitation. ? ?She does have some intermittent fecal incontinence, as well as history of adenomatous polyp.  While she is 4, I do think that this should be followed up.  Think that she should be seen by GI for multiple reasons including this, fecal incontinence, and for additional assessment. ? ?In the setting of some abdominal pain, lateral side pain, question of mass/fullness, obtain a CT of the  abdomen and pelvis with contrast to evaluate for neoplasm. ? ?No orders of the defined types were placed in this encounter. ? ?There are no discontinued medications. ?Orders Placed This Encounter  ?Procedures  ?? CT Abdomen Pelvis W Contrast  ?? Basic metabolic panel  ?? Hepatic function panel  ?? Lipase  ?? Ambulatory referral to Gastroenterology  ? ? ?Follow-up: No follow-ups on file. ? ?  Dragon Medical One speech-to-text software was used for transcription in this dictation.  Possible transcriptional errors can occur using Editor, commissioning.  ? ?Signed, ? ?Shelden Raborn T. Yusuke Beza, MD ? ? ?Outpatient Encounter Medications as of 11/29/2021  ?Medication Sig  ?? amiodarone (PACERONE) 200 MG tablet Take 1 tablet (200 mg total) by mouth daily.  ?? b complex vitamins tablet Take 1 tablet daily by mouth.  ?? Calcium Citrate-Vitamin D (CALCIUM + D PO) Take 1 capsule daily by mouth.   ?? carvedilol (COREG) 3.125 MG tablet TAKE 1 TABLET BY MOUTH TWICE A DAY  ?? cetirizine (ZYRTEC) 10 MG tablet Take 10 mg by mouth daily.  ?? donepezil (ARICEPT) 5 MG tablet TAKE 1 TABLET BY MOUTH DAILY AT BEDTIME  ?? fluticasone (FLONASE) 50 MCG/ACT nasal spray PLACE TWO SPRAYS INTO BOTH NOSTRILS DAILY  ?? IRON, FERROUS SULFATE, PO Take 1 tablet by mouth daily.  ?? losartan (COZAAR) 25 MG tablet TAKE 1 TABLET BY MOUTH ONCE DAILY  ?? metroNIDAZOLE (METROCREAM) 0.75 % cream Apply topically 2 (two) times daily.  ?? Omega-3 Fatty Acids (FISH OIL PO) Take 1 capsule by mouth daily.  ?? permethrin (ELIMITE) 5 % cream APPLY PEA SIZED AMOUNT TO FACE ONCE A DAY  ?? traZODone (DESYREL) 50 MG tablet TAKE ONE HALF TO ONE TABLET BY MOUTH AT BEDTIME AS NEEDED FOR SLEEP  ?? warfarin (COUMADIN) 5 MG tablet TAKE 1/2 TO 1 TABLET BY MOUTH DAILY AS DIRECTED BY COUMADIN CLINIC  ?? [DISCONTINUED] levothyroxine (SYNTHROID) 75 MCG tablet TAKE 1 TAB BY MOUTH ONCE DAILY. TAKE ON AN EMPTY STOMACH WITH A GLASS OF WATER ATLEAST 30-60 MINUTES BEFORE BREAKFAST  ? ?No  facility-administered encounter medications on file as of 11/29/2021.  ?  ?

## 2021-11-30 LAB — BASIC METABOLIC PANEL
BUN: 17 mg/dL (ref 6–23)
CO2: 28 mEq/L (ref 19–32)
Calcium: 9.1 mg/dL (ref 8.4–10.5)
Chloride: 106 mEq/L (ref 96–112)
Creatinine, Ser: 1.28 mg/dL — ABNORMAL HIGH (ref 0.40–1.20)
GFR: 40.5 mL/min — ABNORMAL LOW (ref >60.00)
Glucose, Bld: 89 mg/dL (ref 70–99)
Potassium: 4.4 mEq/L (ref 3.5–5.1)
Sodium: 141 mEq/L (ref 135–145)

## 2021-11-30 LAB — HEPATIC FUNCTION PANEL
ALT: 16 U/L (ref 0–35)
AST: 19 U/L (ref 0–37)
Albumin: 4.3 g/dL (ref 3.5–5.2)
Alkaline Phosphatase: 70 U/L (ref 39–117)
Bilirubin, Direct: 0.1 mg/dL (ref 0.0–0.3)
Total Bilirubin: 0.6 mg/dL (ref 0.2–1.2)
Total Protein: 6.5 g/dL (ref 6.0–8.3)

## 2021-11-30 LAB — LIPASE: Lipase: 32 U/L (ref 11.0–59.0)

## 2021-12-01 ENCOUNTER — Telehealth: Payer: Self-pay

## 2021-12-01 ENCOUNTER — Telehealth: Payer: Self-pay | Admitting: Gastroenterology

## 2021-12-01 NOTE — Telephone Encounter (Signed)
CALLED PATIENT NO ANSWER LEFT VOICEMAIL FOR A CALL BACK ? ?

## 2021-12-01 NOTE — Telephone Encounter (Signed)
Patient states that if you cannot get ahold of her then you may go ahead and schedule her colonoscopy on any day other than a Wednesday. You may leave a detailed message on either number. ?

## 2021-12-06 ENCOUNTER — Encounter: Payer: Self-pay | Admitting: Family Medicine

## 2021-12-06 ENCOUNTER — Other Ambulatory Visit: Payer: Self-pay | Admitting: Family Medicine

## 2021-12-15 ENCOUNTER — Telehealth: Payer: Self-pay

## 2021-12-15 NOTE — Telephone Encounter (Signed)
Patient had questions and I answered all questions ?

## 2021-12-15 NOTE — Telephone Encounter (Signed)
Pt returned call  stated that she will be home after 10:00am ?

## 2021-12-23 ENCOUNTER — Other Ambulatory Visit: Payer: Self-pay | Admitting: Family Medicine

## 2021-12-30 ENCOUNTER — Other Ambulatory Visit: Payer: Self-pay | Admitting: Family Medicine

## 2022-01-05 ENCOUNTER — Ambulatory Visit (INDEPENDENT_AMBULATORY_CARE_PROVIDER_SITE_OTHER): Payer: Medicare HMO

## 2022-01-05 DIAGNOSIS — Z5181 Encounter for therapeutic drug level monitoring: Secondary | ICD-10-CM

## 2022-01-05 DIAGNOSIS — I4891 Unspecified atrial fibrillation: Secondary | ICD-10-CM | POA: Diagnosis not present

## 2022-01-05 DIAGNOSIS — Z7901 Long term (current) use of anticoagulants: Secondary | ICD-10-CM

## 2022-01-05 LAB — POCT INR: INR: 3.2 — AB (ref 2.0–3.0)

## 2022-01-05 NOTE — Patient Instructions (Signed)
-   continue dosage of warfarin 1 tablet daily EXCEPT 1/2 tablet on [place]. ?- recheck in 6 weeks. Eat greens tonight. ? ?

## 2022-02-16 ENCOUNTER — Ambulatory Visit (INDEPENDENT_AMBULATORY_CARE_PROVIDER_SITE_OTHER): Payer: Medicare HMO

## 2022-02-16 DIAGNOSIS — Z5181 Encounter for therapeutic drug level monitoring: Secondary | ICD-10-CM | POA: Diagnosis not present

## 2022-02-16 DIAGNOSIS — Z7901 Long term (current) use of anticoagulants: Secondary | ICD-10-CM | POA: Diagnosis not present

## 2022-02-16 DIAGNOSIS — I4891 Unspecified atrial fibrillation: Secondary | ICD-10-CM | POA: Diagnosis not present

## 2022-02-16 LAB — POCT INR: INR: 3.9 — AB (ref 2.0–3.0)

## 2022-02-16 NOTE — Patient Instructions (Signed)
-   HOLD TONIGHT ONLY and then continue dosage of warfarin 1 tablet daily EXCEPT 1/2 tablet on [place]. - recheck in 4 weeks.

## 2022-02-28 ENCOUNTER — Other Ambulatory Visit: Payer: Self-pay | Admitting: *Deleted

## 2022-02-28 MED ORDER — TRAZODONE HCL 50 MG PO TABS: ORAL_TABLET | ORAL | 5 refills | Status: DC

## 2022-03-16 ENCOUNTER — Ambulatory Visit (INDEPENDENT_AMBULATORY_CARE_PROVIDER_SITE_OTHER): Payer: Medicare HMO

## 2022-03-16 DIAGNOSIS — Z7901 Long term (current) use of anticoagulants: Secondary | ICD-10-CM

## 2022-03-16 DIAGNOSIS — Z5181 Encounter for therapeutic drug level monitoring: Secondary | ICD-10-CM | POA: Diagnosis not present

## 2022-03-16 DIAGNOSIS — I4891 Unspecified atrial fibrillation: Secondary | ICD-10-CM

## 2022-03-16 LAB — POCT INR: INR: 2.2 (ref 2.0–3.0)

## 2022-03-16 NOTE — Patient Instructions (Signed)
continue dosage of warfarin 1 tablet daily EXCEPT 1/2 tablet on MONDAYS & FRIDAYS. - recheck in 6 weeks.  

## 2022-03-21 ENCOUNTER — Other Ambulatory Visit: Payer: Self-pay | Admitting: Internal Medicine

## 2022-03-21 NOTE — Telephone Encounter (Signed)
Refill request

## 2022-03-28 ENCOUNTER — Other Ambulatory Visit: Payer: Self-pay | Admitting: Family Medicine

## 2022-04-05 ENCOUNTER — Ambulatory Visit: Payer: Medicare HMO | Admitting: Gastroenterology

## 2022-04-05 ENCOUNTER — Telehealth: Payer: Self-pay

## 2022-04-05 ENCOUNTER — Encounter: Payer: Self-pay | Admitting: Gastroenterology

## 2022-04-05 ENCOUNTER — Telehealth: Payer: Self-pay | Admitting: Internal Medicine

## 2022-04-05 VITALS — BP 136/79 | HR 60 | Temp 98.1°F | Ht 67.5 in | Wt 172.0 lb

## 2022-04-05 DIAGNOSIS — R1012 Left upper quadrant pain: Secondary | ICD-10-CM

## 2022-04-05 DIAGNOSIS — Z86010 Personal history of colonic polyps: Secondary | ICD-10-CM

## 2022-04-05 MED ORDER — CLENPIQ 10-3.5-12 MG-GM -GM/160ML PO SOLN: 1.0000 | Freq: Once | ORAL | 0 refills | Status: AC

## 2022-04-05 NOTE — Telephone Encounter (Signed)
Blood thinner/procedure clearance faxed to Dr End

## 2022-04-05 NOTE — Progress Notes (Signed)
Gastroenterology Consultation  Referring Provider:     Owens Loffler, MD Primary Care Physician:  Owens Loffler, MD Primary Gastroenterologist:  Dr. Allen Norris     Reason for Consultation:     Abdominal discomfort and constipation        HPI:   [name] is a 76 y.o. y/o female referred for consultation & management of Abdominal discomfort and constipation by Dr. Owens Loffler, MD.  This patient comes in today with left-sided abdominal pain that is exacerbated when she goes to stand up and in certain positions.  She denies any change in her pain when she eats or drinks.  She also reports that she has chronic constipation and has to run back-and-forth to have a bowel movement.  She reports that it happens also when she is driving and can get a severe pain right under her ribs on the left side.  There is no report of any unexplained weight loss associated with this.  She also denies any nausea vomiting fevers chills black stools or bloody stools. The patient last colonoscopy was done by Dr. Alice Reichert in 2018 and she was found to have a tubular adenoma.  The patient has not had any follow-up since that time for the polyp.  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[place]) 09/29/2014   a) [place] Echo: EF ~10% Severe LV dilation & global LV Systolic dysfxn, restrictive filling  pattern - Gr 3 DD, mod RV dysfxn, severe LA dilation - 6.4 cm, mod RA dil, mod pl effusion, mod MR, mild TR; b) TEE 10/07/14: EF ~10%, Severe LV dilation & dysfxn Mod RV dysfxn, LAA oversewn, Mod RA & TR   Diverticulosis    H/O: rheumatic fever    HYPERLIPIDEMIA 04/05/2010   Qualifier: Diagnosis of  By: Lorelei Pont MD, Spencer     Hypothyroid    Migraine headache    Osteopenia    Paroxysmal atrial fibrillation ([place]) 2005; Recurrent 09/2014   a) 2005: s/p Cox Maze & LAA ligation; b) 09/2014: TEE-cardioversion   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  Posterior MV leaflet is fixed and immobile.  MAC.  reduced excursion of the anterior MV leaflet.  Mean MV gradient was 31m Hg and MVA calculated at cm2.      Urinary incontinence     Past Surgical History:  Procedure Laterality Date   ABDOMINAL HYSTERECTOMY     CARDIAC CATHETERIZATION  Jan 2005   Pre-op R&LHC -- Nonobstructive CAD; normal PA pressures   CARDIOVERSION  06/04/2012   Procedure: CARDIOVERSION;  Surgeon: JCarlena Bjornstad MD;  [facility]  Service: Cardiovascular;  Laterality: N/A;   CARDIOVERSION  N/A 10/07/2014   Procedure: CARDIOVERSION;  Surgeon: Sueanne Margarita, MD;  Location: [facility];  Service: Cardiovascular;  Laterality: N/A;   COLONOSCOPY WITH PROPOFOL N/A 07/11/2017   Procedure: COLONOSCOPY WITH PROPOFOL;  Surgeon: Toledo, Benay Pike, MD;  Location: [facility];  Service: Gastroenterology;  Laterality: N/A;   LUMBAR DISC SURGERY     x 2 distantly   MITRAL VALVE REPAIR  2005   with [facility] Maze for [employer]   NM MYOVIEW LTD  4/7/'16   EF 37% with septal hypokinesis and diffuse hypokinesis. No ischemia or infarction. 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   RIGHT HEART  CATHETERIZATION N/A 10/03/2014   Procedure: RIGHT HEART CATH;  Surgeon: Jolaine Artist, MD;  [facility] C[facility] LAB;  Service: Cardiovascular;  RAP 50mHg, RVP 35/2/9 mmHg, PAP 45/12 mmHg, PCWP 16 mmHg; CO/I by Fick: 3.9/2.3; Ao/PA/SVC SaO2%: 97%/63%/65%.   TEE WITHOUT CARDIOVERSION N/A 10/07/2014   Procedure: TRANSESOPHAGEAL ECHOCARDIOGRAM (TEE);  Surgeon: TSueanne Margarita MD;  [facility] [facility]  Service: Cardiovascular;  Laterality: N/A;   THYROIDECTOMY, PARTIAL     partial-no cancer; now on thyroid replacement   TRANSTHORACIC ECHOCARDIOGRAM  2013; 2/'16 & 5/'16   a) 2013: EF 60-65%, mild MR, Gr 2 DD; b) 2/'16 @ [facility] EF ~10% - severel global LV dysfunction (systolic & diastolic) - dilated LV & restrictive filling pattern - Gr 3 DD, mod reduced RV function, severely dilated LA - 6.4 cm, mod dilated RA, mod pl effusion, mod MR, mild TR; c) 5/10/'16:  EF 25-30%, mod LV dilation, no RWMA,Gr 3 DD w/ high LAP, MV sewing ring intact w/ Mod MR, Severe LA dilation   TRANSTHORACIC ECHOCARDIOGRAM  January 2017:   EF improved to 45-50%. Mild diffuse HK. Mild MR. Moderate LA dilation. Normal PA pressures.    Prior to Admission medications   Medication Sig Start Date End Date Taking? Authorizing Provider  amiodarone (PACERONE) 200 MG tablet Take 1 tablet (200 mg total) by mouth daily. 08/03/21  Yes End, CHarrell Gave MD  b complex vitamins tablet Take 1 tablet daily by mouth.   Yes [provider]  Calcium Citrate-Vitamin D (CALCIUM + D PO) Take 1 capsule daily by mouth.    Yes [provider]  carvedilol (COREG) 3.125 MG tablet TAKE 1 TABLET BY MOUTH TWICE A DAY 09/21/21  Yes [name], SFrederico Hamman MD  cetirizine (ZYRTEC) 10 MG tablet Take 10 mg by mouth daily.   Yes [provider]  donepezil (ARICEPT) 5 MG tablet TAKE 1 TABLET BY MOUTH DAILY AT BEDTIME 07/04/21  Yes [name], SFrederico Hamman MD  fluticasone (FLONASE) 50 MCG/ACT nasal spray PLACE TWO SPRAYS INTO BOTH NOSTRILS DAILY 12/30/21   Yes [name], Spencer, MD  IRON, FERROUS SULFATE, PO Take 1 tablet by mouth daily.   Yes [provider]  levothyroxine (SYNTHROID) 75 MCG tablet TAKE 1 TAB BY MOUTH ONCE DAILY. TAKE ON AN EMPTY STOMACH WITH A GLASS OF WATER ATLEAST 30-60 MINUTES BEFORE BREAKFAST 12/06/21  Yes [name], SFrederico Hamman MD  losartan (COZAAR) 25 MG tablet TAKE 1 TABLET BY MOUTH ONCE DAILY 09/07/21  Yes [name], SFrederico Hamman MD  metroNIDAZOLE (METROCREAM) 0.75 % cream Apply topically 2 (two) times daily. 05/15/20  Yes [name], SFrederico Hamman MD  Omega-3 Fatty Acids (FISH OIL PO) Take 1 capsule by mouth daily.   Yes [provider]  permethrin (ELIMITE) 5 % cream APPLY PEA SIZED AMOUNT TO FACE ONCE A DAY 03/22/18  Yes [provider]  traZODone (DESYREL) 50 MG tablet TAKE ONE HALF TO ONE TABLET BY MOUTH AT BEDTIME AS NEEDED FOR SLEEP 02/28/22  Yes [name], Frederico Hamman, MD  warfarin (COUMADIN) 5 MG tablet TAKE 1/2 TO 1 TABLET BY MOUTH DAILY AS DIRECTED BY COUMADIN CLINIC 03/21/22  Yes End, Harrell Gave, MD    Family History  Problem Relation Age of Onset   Diabetes Mother    Coronary artery disease Mother    Kidney disease Mother    Breast cancer Neg Hx      Social History   Tobacco Use   Smoking status: Never   Smokeless tobacco: Never  Vaping Use   Vaping Use: Never used  Substance Use Topics   Alcohol use: Not Currently   Drug use: No    Allergies as of 04/05/2022   (No Known Allergies)    Review of Systems:    All systems reviewed and negative except where noted in HPI.   Physical Exam:  BP 136/79   Pulse 60   Temp 98.1 F (36.7 C) (Oral)   Ht 5' 7.5" (1.715 m)   Wt 172 lb (78 kg)   BMI 26.54 kg/m  No LMP recorded. Patient is postmenopausal. General:   Alert,  Well-developed, well-nourished, pleasant and cooperative in NAD Head:  Normocephalic and atraumatic. Eyes:  Sclera clear, no icterus.   Conjunctiva pink. Ears:  Normal auditory acuity. Neck:  Supple; no masses or  thyromegaly. Lungs:  Respirations even and unlabored.  Clear throughout to auscultation.   No wheezes, crackles, or rhonchi. No acute distress. Heart:  Regular rate and rhythm; no murmurs, clicks, rubs, or gallops. Abdomen:  Normal bowel sounds.  No bruits.  Soft, non-tender and non-distended without masses, hepatosplenomegaly or hernias noted.  No guarding or rebound tenderness.  Negative Carnett sign.   Rectal:  Deferred.  Pulses:  Normal pulses noted. Extremities:  No clubbing or edema.  No cyanosis. Neurologic:  Alert and oriented x3;  grossly normal neurologically. Skin:  Intact without significant lesions or rashes.  No jaundice. Lymph Nodes:  No significant cervical adenopathy. Psych:  Alert and cooperative. Normal mood and affect.  Imaging Studies: No results found.  Assessment and Plan:   CHERA SLIVKA is a 76 y.o. y/o female who comes in today with a history of abdominal discomfort that is consistent with musculoskeletal pain and not associated with any GI symptoms.  The patient has chronic constipation and has been told to titrate MiraLAX to having a bowel movement once a day to every other day.  The patient has a history of adenomatous colon polyps. The patient is also on Coumadin and will contact her primary care provider to see if she can stop the Coumadin prior to having the colonoscopy.  The patient has been explained the plan and agrees with it.    Lucilla Lame, MD. Marval Regal    Note: This dictation was prepared with Dragon dictation along with smaller phrase technology. Any transcriptional errors that result from this process are unintentional.

## 2022-04-05 NOTE — Telephone Encounter (Signed)
   Pre-operative Risk Assessment    Patient Name: [name]  DOB: 06-Mar-1946 MRN: [id number]      Request for Surgical Clearance    Procedure:   colonoscopy  Date of Surgery:  Clearance 05/17/22                                 Surgeon:  not indicated Surgeon's Group or Practice Name:  [facility] Phone number:  [phone] Fax number:  [phone]   Type of Clearance Requested:   - Pharmacy:  Hold Warfarin (Coumadin) instructions    Type of Anesthesia:  General    Additional requests/questions:    Manfred Arch   04/05/2022, 4:44 PM

## 2022-04-06 NOTE — Telephone Encounter (Signed)
Left message to call back for tele appt

## 2022-04-06 NOTE — Telephone Encounter (Signed)
Primary Cardiologist:Christopher End, MD   Preoperative team, please contact this patient and set up a phone call appointment for further preoperative risk assessment. Please obtain consent and complete medication review. Thank you for your help.   I confirm that guidance regarding antiplatelet and oral anticoagulation therapy has been completed and, if necessary, noted below.   Emmaline Life, NP-C    04/06/2022, 9:01 AM [facility] [address] Office [phone] Fax [phone]

## 2022-04-06 NOTE — Telephone Encounter (Signed)
Patient with diagnosis of afib on warfarin for anticoagulation.    Procedure: colonoscopy Date of procedure: 05/17/22  CHA2DS2-VASc Score = 7  This indicates a 11.2% annual risk of stroke. The patient's score is based upon: CHF History: 1 HTN History: 1 Diabetes History: 0 Stroke History: 2 Vascular Disease History: 0 Age Score: 2 Gender Score: 1   CrCl 3m/min Platelet count 185K  Per office protocol, patient can hold warfarin for 5 days prior to procedure. Patient will need bridging with Lovenox around procedure. This [facility].  **This guidance is not considered finalized until pre-operative APP has relayed final recommendations.**

## 2022-04-08 ENCOUNTER — Telehealth: Payer: Self-pay | Admitting: *Deleted

## 2022-04-08 NOTE — Telephone Encounter (Signed)
S/w the pt and she agreeable to plan of care for tele preop appt 04/29/22 @ 2:40. Med rec and consent are done.

## 2022-04-08 NOTE — Telephone Encounter (Signed)
S/w the pt and she agreeable to plan of care for tele preop appt 04/29/22 @ 2:40. Med rec and consent are done.     Patient Consent for Virtual Visit        [name] has provided verbal consent on 04/08/2022 for a virtual visit (video or telephone).   CONSENT FOR VIRTUAL VISIT FOR:  [name]  By participating in this virtual visit I agree to the following:  I hereby voluntarily request, consent and authorize [facility] and its employed or contracted physicians, physician assistants, nurse practitioners or other licensed health care professionals (the Practitioner), to provide me with telemedicine health care services (the "Services") as deemed necessary by the treating Practitioner. I acknowledge and consent to receive the Services by the Practitioner via telemedicine. I understand that the telemedicine visit will involve communicating with the Practitioner through live audiovisual communication technology and the disclosure of certain medical information by electronic transmission. I acknowledge that I have been given the opportunity to request an in-person assessment or other available alternative prior to the telemedicine visit and am voluntarily participating in the telemedicine visit.  I understand that I have the right to withhold or withdraw my consent to the use of telemedicine in the course of my care at any time, without affecting my right to future care or treatment, and that the Practitioner or I may terminate the telemedicine visit at any time. I understand that I have the right to inspect all information obtained and/or recorded in the course of the telemedicine visit and may receive copies of available information for a reasonable fee.  I understand that some of the potential risks of receiving the Services via telemedicine include:  Delay or interruption in medical evaluation due to technological equipment failure or disruption; Information transmitted may not be  sufficient (e.g. poor resolution of images) to allow for appropriate medical decision making by the Practitioner; and/or  In rare instances, security protocols could fail, causing a breach of personal health information.  Furthermore, I acknowledge that it is my responsibility to provide information about my medical history, conditions and care that is complete and accurate to the best of my ability. I acknowledge that Practitioner's advice, recommendations, and/or decision may be based on factors not within their control, such as incomplete or inaccurate data provided by me or distortions of diagnostic images or specimens that may result from electronic transmissions. I understand that the practice of medicine is not an exact science and that Practitioner makes no warranties or guarantees regarding treatment outcomes. I acknowledge that a copy of this consent can be made available to me via my patient portal ([facility]), or I can request a printed copy by calling the office of [facility].    I understand that my insurance will be billed for this visit.   I have read or had this consent read to me. I understand the contents of this consent, which adequately explains the benefits and risks of the Services being provided via telemedicine.  I have been provided ample opportunity to ask questions regarding this consent and the Services and have had my questions answered to my satisfaction. I give my informed consent for the services to be provided through the use of telemedicine in my medical care

## 2022-04-19 ENCOUNTER — Ambulatory Visit (INDEPENDENT_AMBULATORY_CARE_PROVIDER_SITE_OTHER)
Admission: RE | Admit: 2022-04-19 | Discharge: 2022-04-19 | Disposition: A | Discharge status: Home / Self Care | Payer: Medicare HMO | Source: Ambulatory Visit | Attending: Family Medicine | Admitting: Family Medicine

## 2022-04-19 ENCOUNTER — Telehealth: Payer: Self-pay

## 2022-04-19 ENCOUNTER — Ambulatory Visit (INDEPENDENT_AMBULATORY_CARE_PROVIDER_SITE_OTHER): Payer: Medicare HMO | Admitting: Family Medicine

## 2022-04-19 VITALS — BP 110/60 | HR 53 | Temp 97.0°F | Wt 171.5 lb

## 2022-04-19 DIAGNOSIS — M79602 Pain in left arm: Secondary | ICD-10-CM | POA: Diagnosis not present

## 2022-04-19 MED ORDER — HYDROCODONE-ACETAMINOPHEN 5-325 MG PO TABS: 1.0000 | ORAL_TABLET | Freq: Four times a day (QID) | ORAL | 0 refills | Status: DC | PRN

## 2022-04-19 NOTE — Telephone Encounter (Signed)
Immunization History  Administered Date(s) Administered   Fluad Quad(high Dose 65+) 07/29/2020   Influenza Split 05/11/2011   Influenza Whole 06/30/2010   Influenza, High Dose Seasonal PF 06/27/2015, 05/01/2018, 04/28/2019, 06/10/2021   Influenza,inj,Quad PF,6+ Mos 07/29/2013, 05/27/2016, 05/16/2017   PFIZER(Purple Top)SARS-COV-2 Vaccination 09/02/2019, 09/23/2019, 06/02/2020   Pfizer Covid-19 Vaccine Bivalent Booster 33yr & up 06/10/2021   Pneumococcal Conjugate-13 08/26/2015   Pneumococcal Polysaccharide-23 04/05/2010   Td 02/27/2019   Zoster Recombinat (Shingrix) 03/29/2018, 08/25/2018    No need for a pneumonia vaccine - she is up to date, and she should not need an additional vaccine in the future.

## 2022-04-19 NOTE — Patient Instructions (Addendum)
X-ray without a fracture! YAY  You have some arthritis  Work on Range of motion exercises  Hydrocodone for severe pain - may make you sleepy

## 2022-04-19 NOTE — Telephone Encounter (Signed)
Pt in office and requesting Pneumonia vaccine at a nurse visit on 04/21/22. Verbal orders needed and which shot she should get. Please advise.

## 2022-04-19 NOTE — Progress Notes (Signed)
Subjective:     [name] is a 76 y.o. female presenting for Fall (X 9 days ago. L shoulder and arm pain/bruising. Limited range of motion.)     Fall    #Left shoulder pain - bruising - range of motion is limited - not lifting things due to the pain - has hard time laying on that side - treatment: tylenol w/ short improvement  - only taking 8 pills per day   Review of Systems   Social History   Tobacco Use  Smoking Status Never  Smokeless Tobacco Never        Objective:    BP Readings from Last 3 Encounters:  04/19/22 110/60  04/05/22 136/79  11/29/21 130/70   Wt Readings from Last 3 Encounters:  04/19/22 171 lb 8 oz (77.8 kg)  04/05/22 172 lb (78 kg)  11/29/21 171 lb 5 oz (77.7 kg)    BP 110/60   Pulse (!) 53   Temp (!) 97 F (36.1 C) (Temporal)   Wt 171 lb 8 oz (77.8 kg)   SpO2 96%   BMI 26.46 kg/m    Physical Exam Constitutional:      General: She is not in acute distress.    Appearance: She is well-developed. She is not diaphoretic.  HENT:     Right Ear: External ear normal.     Left Ear: External ear normal.     Nose: Nose normal.  Eyes:     Conjunctiva/sclera: Conjunctivae normal.  Cardiovascular:     Rate and Rhythm: Normal rate.  Pulmonary:     Effort: Pulmonary effort is normal.  Musculoskeletal:     Cervical back: Neck supple.     Comments: Left Shoulder Inspection: arm positioned close to side, bruising along the inner upper arm and chest wall Palpation: TTP along the head of the humerus and upper humerus body ROM: unable to life the arm beyond 80 degrees with flexion/abduction, rotation is limited and painful Strength: normal    Chest:  Some bruising and ttp along the left pectoralis muscle  Skin:    General: Skin is warm and dry.     Capillary Refill: Capillary refill takes less than 2 seconds.  Neurological:     Mental Status: She is alert. Mental status is at baseline.  Psychiatric:        Mood and Affect:  Mood normal.        Behavior: Behavior normal.     XR (my read): some signs of arthritis. Some thickening over area of bruising  Stat read requested  DG Humerus Left CLINICAL DATA:  Bruising and left arm pain post fall  EXAM: LEFT HUMERUS - 2+ VIEW  COMPARISON:  None  FINDINGS: Osseous demineralization.  LEFT glenohumeral degenerative changes with joint space narrowing and minimal spur formation.  Elbow joint alignment normal.  No acute fracture, dislocation, or bone destruction.  IMPRESSION: No acute abnormalities.  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  Electronically Signed   By: Lavonia Dana M.D.   On: 04/19/2022 12:17        Assessment & Plan:   Problem List Items Addressed This Visit       Other   Left arm pain - Primary    Patient with fall, and bruising.  X-ray done due to bony tenderness on the humerus, no acute fracture noted.  Advised slowly doing range of motion due to some pain with lifting the arm.  Motion does not improve in  the next 1 to 2 weeks advised physical therapy or follow-up with PCP.  Hydrocodone provided for severe pain if needed.  Patient is on warfarin and cannot take NSAIDs.  Discussed sedation risk, and advised taking primarily at night.      Relevant Medications   HYDROcodone-acetaminophen (NORCO/VICODIN) 5-325 MG tablet   Other Relevant Orders   DG Humerus Left (Completed)     Return in about 2 weeks (around 05/03/2022) for if not improving.  Lesleigh Noe, MD

## 2022-04-19 NOTE — Assessment & Plan Note (Signed)
Patient with fall, and bruising.  X-ray done due to bony tenderness on the humerus, no acute fracture noted.  Advised slowly doing range of motion due to some pain with lifting the arm.  Motion does not improve in the next 1 to 2 weeks advised physical therapy or follow-up with PCP.  Hydrocodone provided for severe pain if needed.  Patient is on warfarin and cannot take NSAIDs.  Discussed sedation risk, and advised taking primarily at night.

## 2022-04-21 ENCOUNTER — Ambulatory Visit: Payer: Medicare HMO

## 2022-04-27 ENCOUNTER — Ambulatory Visit: Payer: Medicare HMO | Attending: Cardiovascular Disease

## 2022-04-27 DIAGNOSIS — Z5181 Encounter for therapeutic drug level monitoring: Secondary | ICD-10-CM | POA: Diagnosis not present

## 2022-04-27 DIAGNOSIS — I4891 Unspecified atrial fibrillation: Secondary | ICD-10-CM

## 2022-04-27 DIAGNOSIS — Z7901 Long term (current) use of anticoagulants: Secondary | ICD-10-CM | POA: Diagnosis not present

## 2022-04-27 LAB — POCT INR: INR: 2.2 (ref 2.0–3.0)

## 2022-04-27 MED ORDER — ENOXAPARIN SODIUM 80 MG/0.8ML IJ SOSY: 80.0000 mg | PREFILLED_SYRINGE | Freq: Two times a day (BID) | INTRAMUSCULAR | 1 refills | Status: DC

## 2022-04-27 NOTE — Patient Instructions (Signed)
continue dosage of warfarin 1 tablet daily EXCEPT 1/2 tablet on [name]. - recheck in 4 weeks. Lovenox Bridged for Colonoscopy 9/19.   9/13: Last dose of warfarin.  9/14: No warfarin or enoxaparin (Lovenox).  9/15: Inject enoxaparin '80mg'$  in the fatty abdominal tissue at least 2 inches from the belly button twice a day about 12 hours apart, 8am and 8pm rotate sites. No warfarin.  9/16: Inject enoxaparin in the fatty tissue every 12 hours, 8am and 8pm. No warfarin.  9/17: Inject enoxaparin in the fatty tissue every 12 hours, 8am and 8pm. No warfarin.  9/18: Inject enoxaparin in the fatty tissue in the morning at 8 am (No PM dose). No warfarin.  9/19: Procedure Day - No enoxaparin - Resume warfarin in the evening or as directed by doctor (take an extra half tablet with usual dose for 2 days then resume normal dose).  9/20: Resume enoxaparin inject in the fatty tissue every 12 hours and take warfarin  9/21: Inject enoxaparin in the fatty tissue every 12 hours and take warfarin  9/22: Inject enoxaparin in the fatty tissue every 12 hours and take warfarin  9/23: Inject enoxaparin in the fatty tissue every 12 hours and take warfarin  9/24-9/27: Inject enoxaparin in the fatty tissue every 12 hours and take warfarin  9/27: warfarin appt to check INR.

## 2022-04-28 NOTE — Telephone Encounter (Signed)
Per Dr End stop Warfarin 05/12/2022, start Lovenox inj 9/14-9/18, Resume Warfarin evening of 9/19

## 2022-04-29 ENCOUNTER — Ambulatory Visit: Payer: Medicare HMO | Attending: Cardiology | Admitting: Physician Assistant

## 2022-04-29 DIAGNOSIS — Z01810 Encounter for preprocedural cardiovascular examination: Secondary | ICD-10-CM

## 2022-04-29 NOTE — Progress Notes (Signed)
Virtual Visit via Telephone Note   Because of [name]'s co-morbid illnesses, she is at least at moderate risk for complications without adequate follow up.  This format is felt to be most appropriate for this patient at this time.  The patient did not have access to video technology/had technical difficulties with video requiring transitioning to audio format only (telephone).  All issues noted in this document were discussed and addressed.  No physical exam could be performed with this format.  Please refer to the patient's chart for her consent to telehealth for [name].  Evaluation Performed:  Preoperative cardiovascular risk assessment _____________   Date:  04/29/2022   Patient ID:  Deborah, [name] November 08, 1945, MRN [id number] Patient Location:  Home Provider location:   Office  Primary Care Provider:  Owens Loffler, MD Primary Cardiologist:  Deborah Bush, MD  Chief Complaint / Patient Profile   76 y.o. 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  Past Medical History    Past Medical History:  Diagnosis Date   Anxiety    Asthma    Atrial fibrillation and flutter ([name]) 09/29/2014   Revereted from Afib RVR to 2:1 A Flutter -- TEE/ DCCV 2/9; on Amiodarone   Chronic combined systolic and diastolic CHF, NYHA class 1 (HCC) -- Systolic dysfunction [id number][Y80.16] 09/29/2014   Exacerbation 09/2014 2/2 Afib/flutter with RVR - 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 ([name]) 09/29/2014   a) [place] Echo: EF ~10% Severe LV dilation & global LV Systolic dysfxn, restrictive filling pattern - Gr 3 DD, mod RV dysfxn, severe LA dilation - 6.4 cm, mod RA dil, mod pl effusion, mod MR, mild TR; b) TEE 10/07/14: EF ~10%, Severe LV dilation & dysfxn Mod RV dysfxn, LAA oversewn, Mod RA & TR   Diverticulosis    H/O: rheumatic fever    HYPERLIPIDEMIA 04/05/2010   Qualifier: Diagnosis of  By: Deborah Pont MD, [place]     Hypothyroid    Migraine headache    Osteopenia    Paroxysmal atrial fibrillation ([place]) 2005; Recurrent 09/2014   a) 2005: s/p Cox Maze & LAA ligation; b) 09/2014: TEE-cardioversion   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  Posterior MV leaflet is fixed and immobile.  MAC.  reduced excursion of the anterior MV leaflet.  Mean MV gradient was 40m Hg and MVA calculated at cm2.      Urinary incontinence    Past Surgical History:  Procedure Laterality Date   ABDOMINAL HYSTERECTOMY     CARDIAC CATHETERIZATION  Jan 2005   Pre-op R&LHC -- Nonobstructive CAD; normal PA pressures   CARDIOVERSION  06/04/2012   Procedure: CARDIOVERSION;  Surgeon: Deborah Bjornstad MD;  [place] [facility]  Service: Cardiovascular;  Laterality: N/A;   CARDIOVERSION N/A 10/07/2014   Procedure: CARDIOVERSION;  Surgeon: Deborah Margarita, MD;  Location: [facility];  Service: Cardiovascular;  Laterality: N/A;   COLONOSCOPY WITH PROPOFOL N/A 07/11/2017   Procedure: COLONOSCOPY WITH PROPOFOL;  Surgeon: [name], Deborah Pike, MD;  Location: [facility] ENDOSCOPY;  Service: Gastroenterology;  Laterality: N/A;   LUMBAR DISC SURGERY     x 2 distantly   MITRAL VALVE REPAIR  2005   with [facility] Maze for [employer]   NM MYOVIEW LTD  4/7/'16   EF 37% with septal hypokinesis  and diffuse hypokinesis. No ischemia or infarction. 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   RIGHT HEART CATHETERIZATION N/A 10/03/2014   Procedure: RIGHT HEART CATH;  Surgeon: Deborah Artist, MD;  Location: [facility] CATH LAB;  Service: Cardiovascular;  RAP 28mHg, RVP 35/2/9 mmHg, PAP 45/12 mmHg, PCWP 16 mmHg; CO/I by Fick: 3.9/2.3; Ao/PA/SVC SaO2%: 97%/63%/65%.   TEE WITHOUT CARDIOVERSION N/A 10/07/2014   Procedure: TRANSESOPHAGEAL ECHOCARDIOGRAM (TEE);  Surgeon: TSueanne Margarita MD;  [facility] [facility]  Service: Cardiovascular;  Laterality: N/A;   THYROIDECTOMY, PARTIAL     partial-no cancer; now on thyroid replacement   TRANSTHORACIC ECHOCARDIOGRAM  2013; 2/'16 & 5/'16   a) 2013: EF 60-65%, mild MR, Gr 2 DD; [place]10% - severel global LV dysfunction (systolic & diastolic) - dilated LV & restrictive filling pattern - Gr 3 DD, mod reduced RV function, severely dilated LA - 6.4 cm, mod dilated RA, mod pl effusion, mod MR, mild TR; c) 5/10/'16:  EF 25-30%, mod LV dilation, no RWMA,Gr 3 DD w/ high LAP, MV sewing ring intact w/ Mod MR, Severe LA dilation   TRANSTHORACIC ECHOCARDIOGRAM  January 2017:   EF improved to 45-50%. Mild diffuse HK. Mild MR. Moderate LA dilation. Normal PA pressures.    Allergies  No Known Allergies  History of Present Illness    Deborah JAMERSONis a 76y.o. female who presents via audio/video conferencing for a telehealth visit today.  Pt was last seen in cardiology clinic on 07/06/2021 by Dr. ESaunders Revel  At that time Deborah ZACHERYwas doing well.  The patient is now pending procedure as outlined above. Since her last visit, she has been doing well without exertional chest pain or worsening dyspnea.  Functional ability is mainly limited by knee issue.  She has been given Lovenox bridging instruction during the recent Coumadin clinic visit.  She is aware that she need to hold Coumadin for 5 days prior to the  procedure while doing Lovenox bridging.   Home Medications    Prior to Admission medications   Medication Sig Start Date End Date Taking? Authorizing Provider  amiodarone (PACERONE) 200 MG tablet Take 1 tablet (200 mg total) by mouth daily. 08/03/21   End, CHarrell Gave MD  b complex vitamins tablet Take 1 tablet daily by mouth.    [provider]  Calcium Citrate-Vitamin D (CALCIUM + D PO) Take 1 capsule daily by mouth.     [provider]  carvedilol (COREG) 3.125 MG tablet TAKE 1 TABLET BY MOUTH TWICE A DAY 09/21/21   [name], SFrederico Hamman MD  cetirizine (ZYRTEC) 10 MG tablet Take 10 mg by mouth daily.    [provider]  donepezil (ARICEPT) 5 MG tablet TAKE 1 TABLET BY MOUTH DAILY AT BEDTIME 07/04/21   [name], SFrederico Hamman MD  enoxaparin (LOVENOX) 80 MG/0.8ML injection Inject 0.8 mLs (80 mg total) into the skin every 12 (twelve) hours. 04/27/22   ENelva Bush MD  fluticasone (FLONASE) 50 MCG/ACT nasal spray PLACE TWO SPRAYS INTO BOTH NOSTRILS DAILY 12/30/21   [name], Frederico Hamman, MD  HYDROcodone-acetaminophen (NORCO/VICODIN) 5-325 MG tablet Take 1 tablet by mouth every 6 (six) hours as needed for moderate pain. 04/19/22   Lesleigh Noe, MD  IRON, FERROUS SULFATE, PO Take 1 tablet by mouth daily.    [provider]  levothyroxine (SYNTHROID) 75 MCG tablet TAKE 1 TAB BY MOUTH ONCE DAILY. TAKE ON AN EMPTY STOMACH WITH A GLASS OF WATER ATLEAST 30-60 MINUTES BEFORE BREAKFAST 12/06/21   [name], Frederico Hamman, MD  losartan (COZAAR) 25 MG tablet TAKE 1 TABLET BY MOUTH ONCE DAILY 09/07/21   [name], Frederico Hamman, MD  metroNIDAZOLE (METROCREAM) 0.75 % cream Apply topically 2 (two) times daily. 05/15/20   [name], Frederico Hamman, MD  Omega-3 Fatty Acids (FISH OIL PO) Take 1 capsule by mouth daily.    [provider]  permethrin (ELIMITE) 5 % cream APPLY PEA SIZED AMOUNT TO FACE ONCE A DAY 03/22/18   [provider]  traZODone (DESYREL) 50 MG tablet TAKE ONE HALF TO ONE TABLET BY  MOUTH AT BEDTIME AS NEEDED FOR SLEEP 02/28/22   [name], Frederico Hamman, MD  warfarin (COUMADIN) 5 MG tablet TAKE 1/2 TO 1 TABLET BY MOUTH DAILY AS DIRECTED BY COUMADIN CLINIC 03/21/22   End, Harrell Gave, MD    Physical Exam    Vital Signs:  [name] does not have vital signs available for review today.  Given telephonic nature of communication, physical exam is limited. AAOx3. NAD. Normal affect.  Speech and respirations are unlabored.  Accessory Clinical Findings    None  Assessment & Plan    1.  Preoperative Cardiovascular Risk Assessment:  -Patient has upcoming colonoscopy procedure.  She has a history of mitral valve repair and atrial fibrillation.  Last echocardiogram in February 2022 showed stable mitral valve repair, normal EF, mild MR.  She denies any recent chest pain or worsening dyspnea.  She is cleared to proceed with colonoscopy procedure.  She will require Lovenox bridging while holding Coumadin for 5 days.  This has been arranged by our Coumadin clinic.  A copy of this note will be routed to requesting surgeon.  Time:   Today, I have spent 4 minutes with the patient with telehealth technology discussing medical history, symptoms, and management plan.     [place], [place]  04/29/2022, 2:40 PM

## 2022-05-04 NOTE — Telephone Encounter (Signed)
Preoperative Cardiovascular Risk Assessment:  -Patient has upcoming colonoscopy procedure.  She has a history of mitral valve repair and atrial fibrillation.  Last echocardiogram in February 2022 showed stable mitral valve repair, normal EF, mild MR.  She denies any recent chest pain or worsening dyspnea.  She is cleared to proceed with colonoscopy procedure.  She will require Lovenox bridging while holding Coumadin for 5 days.  This has been arranged by our Coumadin clinic.  [place], [place]   04/29/2022, 2:40 PM

## 2022-05-17 ENCOUNTER — Other Ambulatory Visit: Payer: Self-pay

## 2022-05-17 ENCOUNTER — Encounter
Admission: RE | Disposition: A | Discharge status: Home / Self Care | Payer: Self-pay | Source: Home / Self Care | Attending: Gastroenterology

## 2022-05-17 ENCOUNTER — Ambulatory Visit
Admission: RE | Admit: 2022-05-17 | Discharge: 2022-05-17 | Disposition: A | Discharge status: Home / Self Care | Payer: Medicare HMO | Attending: Gastroenterology | Admitting: Gastroenterology

## 2022-05-17 ENCOUNTER — Ambulatory Visit: Payer: Medicare HMO | Admitting: Anesthesiology

## 2022-05-17 ENCOUNTER — Encounter: Payer: Self-pay | Admitting: Gastroenterology

## 2022-05-17 DIAGNOSIS — N319 Neuromuscular dysfunction of bladder, unspecified: Secondary | ICD-10-CM | POA: Insufficient documentation

## 2022-05-17 DIAGNOSIS — K640 First degree hemorrhoids: Secondary | ICD-10-CM | POA: Diagnosis not present

## 2022-05-17 DIAGNOSIS — Z1211 Encounter for screening for malignant neoplasm of colon: Secondary | ICD-10-CM | POA: Diagnosis present

## 2022-05-17 DIAGNOSIS — D125 Benign neoplasm of sigmoid colon: Secondary | ICD-10-CM | POA: Insufficient documentation

## 2022-05-17 DIAGNOSIS — E785 Hyperlipidemia, unspecified: Secondary | ICD-10-CM | POA: Diagnosis not present

## 2022-05-17 DIAGNOSIS — E039 Hypothyroidism, unspecified: Secondary | ICD-10-CM | POA: Diagnosis not present

## 2022-05-17 DIAGNOSIS — K219 Gastro-esophageal reflux disease without esophagitis: Secondary | ICD-10-CM | POA: Diagnosis not present

## 2022-05-17 DIAGNOSIS — I110 Hypertensive heart disease with heart failure: Secondary | ICD-10-CM | POA: Diagnosis not present

## 2022-05-17 DIAGNOSIS — F32A Depression, unspecified: Secondary | ICD-10-CM | POA: Insufficient documentation

## 2022-05-17 DIAGNOSIS — M1990 Unspecified osteoarthritis, unspecified site: Secondary | ICD-10-CM | POA: Insufficient documentation

## 2022-05-17 DIAGNOSIS — R519 Headache, unspecified: Secondary | ICD-10-CM | POA: Diagnosis not present

## 2022-05-17 DIAGNOSIS — I2510 Atherosclerotic heart disease of native coronary artery without angina pectoris: Secondary | ICD-10-CM | POA: Diagnosis not present

## 2022-05-17 DIAGNOSIS — Z8673 Personal history of transient ischemic attack (TIA), and cerebral infarction without residual deficits: Secondary | ICD-10-CM | POA: Insufficient documentation

## 2022-05-17 DIAGNOSIS — Z860100 Personal history of colon polyps, unspecified: Secondary | ICD-10-CM

## 2022-05-17 DIAGNOSIS — K635 Polyp of colon: Secondary | ICD-10-CM

## 2022-05-17 DIAGNOSIS — I480 Paroxysmal atrial fibrillation: Secondary | ICD-10-CM | POA: Diagnosis not present

## 2022-05-17 DIAGNOSIS — K5730 Diverticulosis of large intestine without perforation or abscess without bleeding: Secondary | ICD-10-CM | POA: Insufficient documentation

## 2022-05-17 DIAGNOSIS — Z952 Presence of prosthetic heart valve: Secondary | ICD-10-CM | POA: Diagnosis not present

## 2022-05-17 DIAGNOSIS — F419 Anxiety disorder, unspecified: Secondary | ICD-10-CM | POA: Diagnosis not present

## 2022-05-17 DIAGNOSIS — I5042 Chronic combined systolic (congestive) and diastolic (congestive) heart failure: Secondary | ICD-10-CM | POA: Insufficient documentation

## 2022-05-17 HISTORY — PX: COLONOSCOPY WITH PROPOFOL: SHX5780

## 2022-05-17 SURGERY — COLONOSCOPY WITH PROPOFOL
Anesthesia: General

## 2022-05-17 MED ORDER — SODIUM CHLORIDE 0.9 % IV SOLN: INTRAVENOUS | Status: DC

## 2022-05-17 MED ORDER — PROPOFOL 1000 MG/100ML IV EMUL
INTRAVENOUS | Status: AC
  Filled 2022-05-17: qty 100

## 2022-05-17 MED ORDER — DEXMEDETOMIDINE HCL IN NACL 80 MCG/20ML IV SOLN
INTRAVENOUS | Status: AC
  Filled 2022-05-17: qty 20

## 2022-05-17 MED ORDER — LIDOCAINE HCL (PF) 2 % IJ SOLN
INTRAMUSCULAR | Status: AC
  Filled 2022-05-17: qty 20

## 2022-05-17 MED ORDER — LIDOCAINE HCL (CARDIAC) PF 100 MG/5ML IV SOSY
PREFILLED_SYRINGE | INTRAVENOUS | Status: DC | PRN
  Administered 2022-05-17: 100 mg via INTRAVENOUS

## 2022-05-17 MED ORDER — PROPOFOL 500 MG/50ML IV EMUL
INTRAVENOUS | Status: DC | PRN
  Administered 2022-05-17: 175 ug/kg/min via INTRAVENOUS

## 2022-05-17 MED ORDER — PROPOFOL 10 MG/ML IV BOLUS
INTRAVENOUS | Status: DC | PRN
  Administered 2022-05-17: 80 mg via INTRAVENOUS

## 2022-05-17 MED ORDER — GLYCOPYRROLATE 0.2 MG/ML IJ SOLN
INTRAMUSCULAR | Status: AC
  Filled 2022-05-17: qty 1

## 2022-05-17 NOTE — Op Note (Signed)
[facility] Gastroenterology Patient Name: [name] Procedure Date: 05/17/2022 8:15 AM MRN: [id number] Account #: [id number] Date of Birth: [birth year] Admit Type: Outpatient Age: 76 Room: [facility] ENDO ROOM 4 Gender: Female Note Status: Finalized Instrument Name: Jasper Riling [id number] Procedure:             Colonoscopy Indications:           High risk colon cancer surveillance: Personal history                         of colonic polyps Providers:             Lucilla Lame MD, MD Referring MD:          Maud Deed. [name] MD, MD (Referring MD) Medicines:             Propofol per Anesthesia Complications:         No immediate complications. Procedure:             Pre-Anesthesia Assessment:                        - Prior to the procedure, a History and Physical was                         performed, and patient medications and allergies were                         reviewed. The patient's tolerance of previous                         anesthesia was also reviewed. The risks and benefits                         of the procedure and the sedation options and risks                         were discussed with the patient. All questions were                         answered, and informed consent was obtained. Prior                         Anticoagulants: The patient has taken no previous                         anticoagulant or antiplatelet agents. ASA Grade                         Assessment: II - A patient with mild systemic disease.                         After reviewing the risks and benefits, the patient                         was deemed in satisfactory condition to undergo the                         procedure.  After obtaining informed consent, the colonoscope was                         passed under direct vision. Throughout the procedure,                         the patient's blood pressure, pulse, and oxygen                         saturations  were monitored continuously. The                         Colonoscope was introduced through the anus and                         advanced to the the cecum, identified by appendiceal                         orifice and ileocecal valve. The colonoscopy was                         performed without difficulty. The patient tolerated                         the procedure well. The quality of the bowel                         preparation was excellent. Findings:      The perianal and digital rectal examinations were normal.      Two sessile polyps were found in the sigmoid colon. The polyps were 2 to       6 mm in size. These polyps were removed with a cold snare. Resection and       retrieval were complete.      Multiple small-mouthed diverticula were found in the entire colon.      Non-bleeding internal hemorrhoids were found during retroflexion. The       hemorrhoids were Grade I (internal hemorrhoids that do not prolapse). Impression:            - Two 2 to 6 mm polyps in the sigmoid colon, removed                         with a cold snare. Resected and retrieved.                        - Diverticulosis in the entire examined colon.                        - Non-bleeding internal hemorrhoids. Recommendation:        - Discharge patient to home.                        - Resume previous diet.                        - Continue present medications.                        - Await pathology results.                        -  Repeat colonoscopy is not recommended for                         surveillance. Procedure Code(s):     --- Professional ---                        [phone], Colonoscopy, flexible; with removal of                         tumor(s), polyp(s), or other lesion(s) by snare                         technique Diagnosis Code(s):     --- Professional ---                        Z86.010, Personal history of colonic polyps                        K63.5, Polyp of colon CPT copyright 2019  American Medical Association. All rights reserved. The codes documented in this report are preliminary and upon coder review may  be revised to meet current compliance requirements. Lucilla Lame MD, MD 05/17/2022 8:43:08 AM This report has been signed electronically. Number of Addenda: 0 Note Initiated On: 05/17/2022 8:15 AM Scope Withdrawal Time: 0 hours 6 minutes 47 seconds  Total Procedure Duration: 0 hours 11 minutes 11 seconds  Estimated Blood Loss:  Estimated blood loss: none.      [facility]

## 2022-05-17 NOTE — Transfer of Care (Signed)
Immediate Anesthesia Transfer of Care Note  Patient: [name]  Procedure(s) Performed: COLONOSCOPY WITH PROPOFOL  Patient Location: Endoscopy Unit  Anesthesia Type:General  Level of Consciousness: drowsy  Airway & Oxygen Therapy: Patient Spontanous Breathing  Post-op Assessment: Report given to RN and Post -op Vital signs reviewed and stable  Post vital signs: Reviewed and stable  Last Vitals:  Vitals Value Taken Time  BP 106/59 05/17/22 0846  Temp    Pulse 50 05/17/22 0847  Resp 22 05/17/22 0847  SpO2 100 % 05/17/22 0847  Vitals shown include unvalidated device data.  Last Pain:  Vitals:   05/17/22 0845  TempSrc:   PainSc: 0-No pain         Complications: No notable events documented.

## 2022-05-17 NOTE — H&P (Signed)
Deborah Lame, MD [address] [place], [place] [address] Phone:[phone] Fax : [phone]  Primary Care Physician:  Owens Loffler, MD Primary Gastroenterologist:  Dr. Allen Norris  Pre-Procedure History & Physical: HPI:  [name] is a 76 y.o. female is here for an colonoscopy.   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[place]) 09/29/2014   a) [place] Echo: EF ~10% Severe LV dilation & global LV Systolic dysfxn, restrictive filling pattern - Gr 3 DD, mod RV dysfxn, severe LA dilation - 6.4 cm, mod RA dil, mod pl effusion, mod MR, mild TR; b) TEE 10/07/14: EF ~10%, Severe LV dilation & dysfxn Mod RV dysfxn, LAA oversewn, Mod RA & TR   Diverticulosis    H/O: rheumatic fever    HYPERLIPIDEMIA 04/05/2010   Qualifier: Diagnosis of  By: Lorelei Pont MD, [place]     Hypothyroid    Migraine headache    Osteopenia    Paroxysmal atrial fibrillation ([place]) 2005; Recurrent 09/2014   a) 2005: s/p Cox Maze & LAA ligation; b) 09/2014: TEE-cardioversion   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  Posterior MV leaflet is fixed and immobile.  MAC.  reduced excursion of the anterior MV leaflet.  Mean MV  gradient was 14m Hg and MVA calculated at cm2.      Urinary incontinence     Past Surgical History:  Procedure Laterality Date   ABDOMINAL HYSTERECTOMY     CARDIAC CATHETERIZATION  Jan 2005   Pre-op R&LHC -- Nonobstructive CAD; normal PA pressures   CARDIOVERSION  06/04/2012   Procedure: CARDIOVERSION;  Surgeon: JCarlena Bjornstad MD;  [facility]  Service: Cardiovascular;  Laterality: N/A;   CARDIOVERSION N/A 10/07/2014   Procedure: CARDIOVERSION;  Surgeon: TSueanne Margarita MD;  [facility]  Service: Cardiovascular;  Laterality: N/A;   COLONOSCOPY WITH PROPOFOL N/A 07/11/2017   Procedure: COLONOSCOPY WITH PROPOFOL;  Surgeon: [place], TBenay Pike MD;  Location: [facility] ENDOSCOPY;  Service: Gastroenterology;  Laterality: N/A;   LUMBAR DISC SURGERY     x 2 distantly   MITRAL VALVE REPAIR  2005   with [facility] Maze for [employer]  NM MYOVIEW LTD  4/7/'16   EF 37% with septal hypokinesis and diffuse hypokinesis. No ischemia or infarction. 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   RIGHT HEART CATHETERIZATION N/A 10/03/2014   Procedure: RIGHT HEART CATH;  Surgeon: DJolaine Artist MD;  [facility];  Service: Cardiovascular;  RAP 550mg, RVP 35/2/9 mmHg, PAP 45/12 mmHg, PCWP 16 mmHg; CO/I by Fick: 3.9/2.3; Ao/PA/SVC SaO2%: 97%/63%/65%.   TEE WITHOUT CARDIOVERSION N/A 10/07/2014   Procedure: TRANSESOPHAGEAL ECHOCARDIOGRAM (  TEE);  Surgeon: Sueanne Margarita, MD;  Location: [place];  Service: Cardiovascular;  Laterality: N/A;   THYROIDECTOMY, PARTIAL     partial-no cancer; now on thyroid replacement   TRANSTHORACIC ECHOCARDIOGRAM  2013; 2/'16 & 5/'16   a) 2013: EF 60-65%, mild MR, Gr 2 DD; b) 2/'16 @ [place]: EF ~10% - severel global LV dysfunction (systolic & diastolic) - dilated LV & restrictive filling pattern - Gr 3 DD, mod reduced RV function, severely dilated LA - 6.4 cm, mod dilated RA, mod pl effusion, mod MR, mild TR; c)  5/10/'16:  EF 25-30%, mod LV dilation, no RWMA,Gr 3 DD w/ high LAP, MV sewing ring intact w/ Mod MR, Severe LA dilation   TRANSTHORACIC ECHOCARDIOGRAM  January 2017:   EF improved to 45-50%. Mild diffuse HK. Mild MR. Moderate LA dilation. Normal PA pressures.    Prior to Admission medications   Medication Sig Start Date End Date Taking? Authorizing Provider  amiodarone (PACERONE) 200 MG tablet Take 1 tablet (200 mg total) by mouth daily. 08/03/21  Yes End, Harrell Gave, MD  b complex vitamins tablet Take 1 tablet daily by mouth.   Yes [provider]  Calcium Citrate-Vitamin D (CALCIUM + D PO) Take 1 capsule daily by mouth.    Yes [provider]  carvedilol (COREG) 3.125 MG tablet TAKE 1 TABLET BY MOUTH TWICE A DAY 09/21/21  Yes [name], Frederico Hamman, MD  cetirizine (ZYRTEC) 10 MG tablet Take 10 mg by mouth daily.   Yes [provider]  donepezil (ARICEPT) 5 MG tablet TAKE 1 TABLET BY MOUTH DAILY AT BEDTIME 07/04/21  Yes [name], Frederico Hamman, MD  enoxaparin (LOVENOX) 80 MG/0.8ML injection Inject 0.8 mLs (80 mg total) into the skin every 12 (twelve) hours. 04/27/22  Yes End, Harrell Gave, MD  fluticasone (FLONASE) 50 MCG/ACT nasal spray PLACE TWO SPRAYS INTO BOTH NOSTRILS DAILY 12/30/21  Yes [name], Frederico Hamman, MD  HYDROcodone-acetaminophen (NORCO/VICODIN) 5-325 MG tablet Take 1 tablet by mouth every 6 (six) hours as needed for moderate pain. 04/19/22  Yes Lesleigh Noe, MD  IRON, FERROUS SULFATE, PO Take 1 tablet by mouth daily.   Yes [provider]  levothyroxine (SYNTHROID) 75 MCG tablet TAKE 1 TAB BY MOUTH ONCE DAILY. TAKE ON AN EMPTY STOMACH WITH A GLASS OF WATER ATLEAST 30-60 MINUTES BEFORE BREAKFAST 12/06/21  Yes [name], Frederico Hamman, MD  losartan (COZAAR) 25 MG tablet TAKE 1 TABLET BY MOUTH ONCE DAILY 09/07/21  Yes [name], Frederico Hamman, MD  metroNIDAZOLE (METROCREAM) 0.75 % cream Apply topically 2 (two) times daily. 05/15/20  Yes [name], Frederico Hamman, MD  Omega-3 Fatty Acids (FISH OIL  PO) Take 1 capsule by mouth daily.   Yes [provider]  permethrin (ELIMITE) 5 % cream APPLY PEA SIZED AMOUNT TO FACE ONCE A DAY 03/22/18  Yes [provider]  traZODone (DESYREL) 50 MG tablet TAKE ONE HALF TO ONE TABLET BY MOUTH AT BEDTIME AS NEEDED FOR SLEEP 02/28/22  Yes [name], Frederico Hamman, MD  warfarin (COUMADIN) 5 MG tablet TAKE 1/2 TO 1 TABLET BY MOUTH DAILY AS DIRECTED BY COUMADIN CLINIC 03/21/22  Yes End, Harrell Gave, MD    Allergies as of 04/06/2022   (No Known Allergies)    Family History  Problem Relation Age of Onset   Diabetes Mother    Coronary artery disease Mother    Kidney disease Mother    Breast cancer Neg Hx     Social History   Socioeconomic History   Marital status: Married    Spouse name:  Not on file   Number of children: 2   Years of education: Not on file   Highest education level: Not on file  Occupational History   Occupation: Retired  Tobacco Use   Smoking status: Never   Smokeless tobacco: Never  Vaping Use   Vaping Use: Never used  Substance and Sexual Activity   Alcohol use: Not Currently   Drug use: No   Sexual activity: Yes  Other Topics Concern   Not on file  Social History Narrative   Not on file   Social Determinants of Health   Financial Resource Strain: Low Risk  (08/14/2020)   Overall Financial Resource Strain (CARDIA)    Difficulty of Paying Living Expenses: Not hard at all  Food Insecurity: No Food Insecurity (08/14/2020)   Hunger Vital Sign    Worried About Running Out of Food in the Last Year: Never true    City of [place] in the Last Year: Never true  Transportation Needs: No Transportation Needs (08/14/2020)   PRAPARE - Hydrologist (Medical): No    Lack of Transportation (Non-Medical): No  Physical Activity: Inactive (08/14/2020)   Exercise Vital Sign    Days of Exercise per Week: 0 days    Minutes of Exercise per Session: 0 min  Stress: No Stress Concern Present  (08/14/2020)   [place]    Feeling of Stress : Not at all  Social Connections: Not on file  Intimate Partner Violence: Not At Risk (08/14/2020)   Humiliation, Afraid, Rape, and Kick questionnaire    Fear of Current or Ex-Partner: No    Emotionally Abused: No    Physically Abused: No    Sexually Abused: No    Review of Systems: See HPI, otherwise negative ROS  Physical Exam: BP (!) 151/69   Pulse (!) 55   Temp (!) 97.1 F (36.2 C) (Temporal)   Resp 18   Ht 5' 7.5" (1.715 m)   Wt 78.9 kg   SpO2 100%   BMI 26.85 kg/m  General:   Alert,  pleasant and cooperative in NAD Head:  Normocephalic and atraumatic. Neck:  Supple; no masses or thyromegaly. Lungs:  Clear throughout to auscultation.    Heart:  Regular rate and rhythm. Abdomen:  Soft, nontender and nondistended. Normal bowel sounds, without guarding, and without rebound.   Neurologic:  Alert and  oriented x4;  grossly normal neurologically.  Impression/Plan: [name] is here for an colonoscopy to be performed for a history of adenomatous polyps on 2018   Risks, benefits, limitations, and alternatives regarding  colonoscopy have been reviewed with the patient.  Questions have been answered.  All parties agreeable.   Deborah Lame, MD  05/17/2022, 8:20 AM

## 2022-05-17 NOTE — Anesthesia Preprocedure Evaluation (Addendum)
Anesthesia Evaluation  Patient identified by MRN, date of birth, ID band Patient awake    Reviewed: Allergy & Precautions, NPO status , Patient's Chart, lab work & pertinent test results  History of Anesthesia Complications Negative for: history of anesthetic complications  Airway Mallampati: II  TM Distance: >3 FB Neck ROM: Full    Dental no notable dental hx.    Pulmonary neg pulmonary ROS,    Pulmonary exam normal breath sounds clear to auscultation       Cardiovascular Exercise Tolerance: Poor hypertension, Pt. on medications +CHF  Normal cardiovascular exam+ dysrhythmias Atrial Fibrillation + Valvular Problems/Murmurs AI  Rhythm:Regular Rate:Normal  Hx of mitral valve replacement  Echo (2022) reviewed    Neuro/Psych  Headaches, PSYCHIATRIC DISORDERS Anxiety Depression Dementia CVA, No Residual Symptoms    GI/Hepatic Neg liver ROS, GERD  ,  Endo/Other  Hypothyroidism   Renal/GU negative Renal ROS Bladder dysfunction      Musculoskeletal  (+) Arthritis , Osteoarthritis,    Abdominal   Peds negative pediatric ROS (+)  Hematology negative hematology ROS (+) On Warfarin, bridging to lovenox    Anesthesia Other Findings   Reproductive/Obstetrics negative OB ROS                           Anesthesia Physical Anesthesia Plan  ASA: 3  Anesthesia Plan: General   Post-op Pain Management: Minimal or no pain anticipated   Induction: Intravenous  PONV Risk Score and Plan: 2 and Propofol infusion and TIVA  Airway Management Planned: Natural Airway and Nasal Cannula  Additional Equipment:   Intra-op Plan:   Post-operative Plan:   Informed Consent: I have reviewed the patients History and Physical, chart, labs and discussed the procedure including the risks, benefits and alternatives for the proposed anesthesia with the patient or authorized representative who has indicated his/her  understanding and acceptance.     Dental Advisory Given  Plan Discussed with: Anesthesiologist, CRNA and Surgeon  Anesthesia Plan Comments: (Patient consented for risks of anesthesia including but not limited to:  - adverse reactions to medications - risk of airway placement if required - damage to eyes, teeth, lips or other oral mucosa - nerve damage due to positioning  - sore throat or hoarseness - Damage to heart, brain, nerves, lungs, other parts of body or loss of life  Patient voiced understanding.)        Anesthesia Quick Evaluation

## 2022-05-17 NOTE — Anesthesia Postprocedure Evaluation (Signed)
Anesthesia Post Note  Patient: [name]  Procedure(s) Performed: COLONOSCOPY WITH PROPOFOL  Patient location during evaluation: Endoscopy Anesthesia Type: General Level of consciousness: awake and alert Pain management: pain level controlled Vital Signs Assessment: post-procedure vital signs reviewed and stable Respiratory status: spontaneous breathing, nonlabored ventilation, respiratory function stable and patient connected to nasal cannula oxygen Cardiovascular status: blood pressure returned to baseline and stable Postop Assessment: no apparent nausea or vomiting Anesthetic complications: no   No notable events documented.   Last Vitals:  Vitals:   05/17/22 0845 05/17/22 0905  BP: (!) 106/59   Pulse:    Resp:    Temp:    SpO2:  98%    Last Pain:  Vitals:   05/17/22 0905  TempSrc:   PainSc: 0-No pain                 Ilene Qua

## 2022-05-18 ENCOUNTER — Encounter: Payer: Self-pay | Admitting: Gastroenterology

## 2022-05-18 ENCOUNTER — Telehealth: Payer: Self-pay | Admitting: Family Medicine

## 2022-05-18 LAB — SURGICAL PATHOLOGY

## 2022-05-18 NOTE — Telephone Encounter (Signed)
It looks to me that patient is up to date on her pneumonia vaccines.  Please advise.

## 2022-05-18 NOTE — Telephone Encounter (Signed)
Patient called in and stated that she wants to get a pneumonia shot. Wasn't sure if an order needed to be put in before she is scheduled for one. Please advise. Thank you!

## 2022-05-18 NOTE — Telephone Encounter (Signed)
Please reassure her that she is fully up-to-date on her pneumonia vaccine.  I think she asked previously, too, and I want her to understand that she should not need any additional pneumonia vaccines ever.    Immunization History  Administered Date(s) Administered   Fluad Quad(high Dose 65+) 07/29/2020   Influenza Split 05/11/2011   Influenza Whole 06/30/2010   Influenza, High Dose Seasonal PF 06/27/2015, 05/01/2018, 04/28/2019, 06/10/2021   Influenza,inj,Quad PF,6+ Mos 07/29/2013, 05/27/2016, 05/16/2017   PFIZER(Purple Top)SARS-COV-2 Vaccination 09/02/2019, 09/23/2019, 06/02/2020   Pfizer Covid-19 Vaccine Bivalent Booster 49yr & up 06/10/2021   Pneumococcal Conjugate-13 08/26/2015   Pneumococcal Polysaccharide-23 04/05/2010   Td 02/27/2019   Zoster Recombinat (Shingrix) 03/29/2018, 08/25/2018

## 2022-05-19 ENCOUNTER — Encounter: Payer: Self-pay | Admitting: Gastroenterology

## 2022-05-25 ENCOUNTER — Ambulatory Visit: Payer: Medicare HMO | Attending: Cardiovascular Disease

## 2022-05-25 DIAGNOSIS — Z5181 Encounter for therapeutic drug level monitoring: Secondary | ICD-10-CM

## 2022-05-25 DIAGNOSIS — I4891 Unspecified atrial fibrillation: Secondary | ICD-10-CM | POA: Diagnosis not present

## 2022-05-25 DIAGNOSIS — Z7901 Long term (current) use of anticoagulants: Secondary | ICD-10-CM

## 2022-05-25 LAB — POCT INR: INR: 2 (ref 2.0–3.0)

## 2022-05-25 NOTE — Patient Instructions (Signed)
continue dosage of warfarin 1 tablet daily EXCEPT 1/2 tablet on MONDAYS & FRIDAYS. - recheck in 6 weeks.  

## 2022-05-26 ENCOUNTER — Ambulatory Visit (INDEPENDENT_AMBULATORY_CARE_PROVIDER_SITE_OTHER): Payer: Medicare HMO

## 2022-05-26 ENCOUNTER — Telehealth: Payer: Self-pay | Admitting: Family Medicine

## 2022-05-26 ENCOUNTER — Ambulatory Visit: Payer: Medicare HMO

## 2022-05-26 DIAGNOSIS — Z23 Encounter for immunization: Secondary | ICD-10-CM

## 2022-05-26 NOTE — Telephone Encounter (Signed)
Patient dropped off placard to be completed and signed by Dr Lorelei Pont. Left in Dr Serita Grit folder up front.

## 2022-05-26 NOTE — Telephone Encounter (Signed)
Handicap Placard Application placed in Dr. Lillie Fragmin office in box for signature.

## 2022-06-08 ENCOUNTER — Other Ambulatory Visit: Payer: Self-pay | Admitting: Family Medicine

## 2022-06-20 ENCOUNTER — Encounter: Payer: Self-pay | Admitting: Family Medicine

## 2022-06-20 ENCOUNTER — Ambulatory Visit (INDEPENDENT_AMBULATORY_CARE_PROVIDER_SITE_OTHER): Payer: Medicare HMO | Admitting: Family Medicine

## 2022-06-20 VITALS — BP 170/86 | HR 60 | Temp 97.9°F | Ht 67.0 in | Wt 174.0 lb

## 2022-06-20 DIAGNOSIS — N3946 Mixed incontinence: Secondary | ICD-10-CM

## 2022-06-20 MED ORDER — TOLTERODINE TARTRATE ER 4 MG PO CP24: 4.0000 mg | ORAL_CAPSULE | Freq: Every day | ORAL | 3 refills | Status: DC

## 2022-06-20 NOTE — Progress Notes (Signed)
Ashaunte Standley T. Espn Zeman, MD, [place] at [facility] [place], [address]  Phone: [phone]  FAX: [phone]  [name] - 76 y.o. female  MRN [id number]  Date of Birth: 1945-10-12  Date: 06/20/2022  PCP: Owens Loffler, MD  Referral: Owens Loffler, MD  Chief Complaint  Patient presents with   Urinary Incontinence    Changing pads 6 to 7 times a day   Subjective:   [name] is a 76 y.o. very pleasant female patient with Body mass index is 27.25 kg/m. who presents with the following:  She is here to talk about her urinary incontinence, which is now a daily issue.  She changes pads roughly 6-7 times a day, and she also has some disposable underwear.  She is using roughly 15 disposable underwear a week, sometimes upwards of 18.  In the past, it looks as if she has used some Detrol and Vesicare, but she does not recall any response to treatment.  She went to 1 urology group in [place] at 1 point several years ago, and they had essentially no suggestions.  Bladder leakage has gotten really bad.  Goes through about 15 disposable underwear a week.  Going through about 6 pads a day.   Letter for incontience  Review of Systems is noted in the HPI, as appropriate  Objective:   BP (!) 170/86   Pulse 60   Temp 97.9 F (36.6 C) (Oral)   Ht '5\' 7"'$  (1.702 m)   Wt 174 lb (78.9 kg)   SpO2 96%   BMI 27.25 kg/m   GEN: No acute distress; alert,appropriate. PULM: Breathing comfortably in no respiratory distress PSYCH: Normally interactive.  CV: RRR, no m/g/r   Laboratory and Imaging Data:  Assessment and Plan:     ICD-10-CM   1. Mixed stress and urge urinary incontinence  N39.46 Ambulatory referral to Urology     Urinary incontinence of unclear origin.  I am going to give the patient some Detrol LA to try.  She is very frustrated, and I can understand her situation.  I think having her see one of  the urologist who specializes in incontinence would be reasonable, and hopefully they can offer some suggestions.  Medication Management during today's office visit: Meds ordered this encounter  Medications   tolterodine (DETROL LA) 4 MG 24 hr capsule    Sig: Take 1 capsule (4 mg total) by mouth daily.    Dispense:  30 capsule    Refill:  3   There are no discontinued medications.  Orders placed today for conditions managed today: Orders Placed This Encounter  Procedures   Ambulatory referral to Urology    Disposition: No follow-ups on file.  [employer] One speech-to-text software was used for transcription in this dictation.  Possible transcriptional errors can occur using Editor, commissioning.   Signed,  Maud Deed. Camillo Quadros, MD   Outpatient Encounter Medications as of 06/20/2022  Medication Sig   amiodarone (PACERONE) 200 MG tablet Take 1 tablet (200 mg total) by mouth daily.   b complex vitamins tablet Take 1 tablet daily by mouth.   Calcium Citrate-Vitamin D (CALCIUM + D PO) Take 1 capsule daily by mouth.    carvedilol (COREG) 3.125 MG tablet TAKE 1 TABLET BY MOUTH TWICE A DAY   cetirizine (ZYRTEC) 10 MG tablet Take 10 mg by mouth daily.   donepezil (ARICEPT) 5 MG tablet TAKE 1 TABLET BY MOUTH DAILY  AT BEDTIME   enoxaparin (LOVENOX) 80 MG/0.8ML injection Inject 0.8 mLs (80 mg total) into the skin every 12 (twelve) hours.   fluticasone (FLONASE) 50 MCG/ACT nasal spray PLACE TWO SPRAYS INTO BOTH NOSTRILS DAILY   HYDROcodone-acetaminophen (NORCO/VICODIN) 5-325 MG tablet Take 1 tablet by mouth every 6 (six) hours as needed for moderate pain.   IRON, FERROUS SULFATE, PO Take 1 tablet by mouth daily.   levothyroxine (SYNTHROID) 75 MCG tablet TAKE 1 TAB BY MOUTH ONCE DAILY. TAKE ON AN EMPTY STOMACH WITH A GLASS OF WATER ATLEAST 30-60 MINUTES BEFORE BREAKFAST   losartan (COZAAR) 25 MG tablet TAKE 1 TABLET BY MOUTH ONCE DAILY   metroNIDAZOLE (METROCREAM) 0.75 % cream Apply topically  2 (two) times daily.   Omega-3 Fatty Acids (FISH OIL PO) Take 1 capsule by mouth daily.   permethrin (ELIMITE) 5 % cream APPLY PEA SIZED AMOUNT TO FACE ONCE A DAY   tolterodine (DETROL LA) 4 MG 24 hr capsule Take 1 capsule (4 mg total) by mouth daily.   traZODone (DESYREL) 50 MG tablet TAKE ONE HALF TO ONE TABLET BY MOUTH AT BEDTIME AS NEEDED FOR SLEEP   warfarin (COUMADIN) 5 MG tablet TAKE 1/2 TO 1 TABLET BY MOUTH DAILY AS DIRECTED BY COUMADIN CLINIC   No facility-administered encounter medications on file as of 06/20/2022.

## 2022-06-21 ENCOUNTER — Other Ambulatory Visit: Payer: Self-pay | Admitting: Family Medicine

## 2022-06-21 ENCOUNTER — Encounter: Payer: Self-pay | Admitting: Family Medicine

## 2022-06-24 ENCOUNTER — Telehealth: Payer: Self-pay | Admitting: Family Medicine

## 2022-06-24 ENCOUNTER — Telehealth: Payer: Self-pay | Admitting: *Deleted

## 2022-06-24 NOTE — Telephone Encounter (Signed)
Received fax from [facility] stating patient has been approved for the Tolterodine Tartrate ER Capsule from 08/29/21 through 08/29/2023.  [place] notified of approval via fax.

## 2022-06-24 NOTE — Telephone Encounter (Signed)
Received fax from [facility] requesting PA for  Tolterodine Tartrate ER 4 mg.  Patient must try and fail preferred:  Oxybutyin Mybetriq  Gemtesa  Will forward to Dr. Lorelei Pont to send in Rx for one of the preferred medications.

## 2022-06-24 NOTE — Telephone Encounter (Signed)
[facility]  prior authorization . Called in with question about pt . Will call back

## 2022-07-06 ENCOUNTER — Ambulatory Visit: Payer: Medicare HMO | Attending: Internal Medicine

## 2022-07-06 DIAGNOSIS — Z5181 Encounter for therapeutic drug level monitoring: Secondary | ICD-10-CM | POA: Diagnosis not present

## 2022-07-06 DIAGNOSIS — I480 Paroxysmal atrial fibrillation: Secondary | ICD-10-CM

## 2022-07-06 LAB — POCT INR: INR: 2 (ref 2.0–3.0)

## 2022-07-06 NOTE — Patient Instructions (Signed)
continue dosage of warfarin 1 tablet daily EXCEPT 1/2 tablet on [place]. - recheck in 6 weeks.

## 2022-07-06 NOTE — Telephone Encounter (Signed)
error 

## 2022-07-29 ENCOUNTER — Encounter: Payer: Self-pay | Admitting: Internal Medicine

## 2022-07-29 ENCOUNTER — Ambulatory Visit: Payer: Medicare HMO | Attending: Internal Medicine | Admitting: Internal Medicine

## 2022-07-29 ENCOUNTER — Other Ambulatory Visit
Admission: RE | Admit: 2022-07-29 | Discharge: 2022-07-29 | Disposition: A | Discharge status: Home / Self Care | Payer: Medicare HMO | Source: Home / Self Care | Attending: Internal Medicine | Admitting: Internal Medicine

## 2022-07-29 ENCOUNTER — Ambulatory Visit
Admission: RE | Admit: 2022-07-29 | Discharge: 2022-07-29 | Disposition: A | Discharge status: Home / Self Care | Payer: Medicare HMO | Attending: Internal Medicine | Admitting: Internal Medicine

## 2022-07-29 ENCOUNTER — Ambulatory Visit
Admission: RE | Admit: 2022-07-29 | Discharge: 2022-07-29 | Disposition: A | Discharge status: Home / Self Care | Payer: Medicare HMO | Source: Ambulatory Visit | Attending: Internal Medicine | Admitting: Internal Medicine

## 2022-07-29 VITALS — BP 120/66 | HR 49 | Ht 67.5 in | Wt 174.0 lb

## 2022-07-29 DIAGNOSIS — R0602 Shortness of breath: Secondary | ICD-10-CM

## 2022-07-29 DIAGNOSIS — I480 Paroxysmal atrial fibrillation: Secondary | ICD-10-CM | POA: Diagnosis not present

## 2022-07-29 DIAGNOSIS — I38 Endocarditis, valve unspecified: Secondary | ICD-10-CM

## 2022-07-29 DIAGNOSIS — Z79899 Other long term (current) drug therapy: Secondary | ICD-10-CM | POA: Diagnosis present

## 2022-07-29 DIAGNOSIS — I428 Other cardiomyopathies: Secondary | ICD-10-CM | POA: Diagnosis not present

## 2022-07-29 DIAGNOSIS — I5020 Unspecified systolic (congestive) heart failure: Secondary | ICD-10-CM

## 2022-07-29 LAB — COMPREHENSIVE METABOLIC PANEL
ALT: 17 U/L (ref 0–44)
AST: 20 U/L (ref 15–41)
Albumin: 3.9 g/dL (ref 3.5–5.0)
Alkaline Phosphatase: 80 U/L (ref 38–126)
Anion gap: 8 (ref 5–15)
BUN: 14 mg/dL (ref 8–23)
CO2: 23 mmol/L (ref 22–32)
Calcium: 9.3 mg/dL (ref 8.9–10.3)
Chloride: 110 mmol/L (ref 98–111)
Creatinine, Ser: 0.83 mg/dL (ref 0.44–1.00)
GFR, Estimated: >60 mL/min (ref >60)
Glucose, Bld: 89 mg/dL (ref 70–99)
Potassium: 4.6 mmol/L (ref 3.5–5.1)
Sodium: 141 mmol/L (ref 135–145)
Total Bilirubin: 0.9 mg/dL (ref 0.3–1.2)
Total Protein: 6.9 g/dL (ref 6.5–8.1)

## 2022-07-29 LAB — CBC
HCT: 39.4 % (ref 36.0–46.0)
Hemoglobin: 12.8 g/dL (ref 12.0–15.0)
MCH: 32.2 pg (ref 26.0–34.0)
MCHC: 32.5 g/dL (ref 30.0–36.0)
MCV: 99 fL (ref 80.0–100.0)
Platelets: 221 10*3/uL (ref 150–400)
RBC: 3.98 MIL/uL (ref 3.87–5.11)
RDW: 13.2 % (ref 11.5–15.5)
WBC: 6.3 10*3/uL (ref 4.0–10.5)
nRBC: 0 % (ref 0.0–0.2)

## 2022-07-29 LAB — TSH: TSH: 4.252 u[IU]/mL (ref 0.350–4.500)

## 2022-07-29 MED ORDER — AMIODARONE HCL 100 MG PO TABS: 100.0000 mg | ORAL_TABLET | Freq: Every day | ORAL | 3 refills | Status: DC

## 2022-07-29 NOTE — Progress Notes (Unsigned)
Follow-up Outpatient Visit Date: 07/29/2022  Primary Care Provider: Owens Loffler, MD [facility] [place] [address]  Chief Complaint: Follow-up atrial fibrillation and valvular heart disease  HPI:  [name] is a 76 y.o. 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  I last saw her a year ago, at which time she was doing well from a heart standpoint.  She continued to struggle with knee pain and was receiving joint injections.  We did not make any medication changes or pursue additional testing at that time.  Today, [name] reports that she has been doing well from a heart standpoint without palpitations, chest pain, or lightheadedness.  She has not been walking much on account of her chronic knee pain.  She feels like she gets out of breath a little more easily with activity.  However, she is still able to work outside and care for her animals without any difficulty.  She recounts 1 episode of dizziness that occurred when she turned her head too quickly this summer.  This has not recurred.  She also denies syncope and falls.  --------------------------------------------------------------------------------------------------  Past Medical History:  Diagnosis Date   Anxiety    Atrial fibrillation and flutter ([place]) 09/29/2014   Revereted from Afib RVR to 2:1 A Flutter -- TEE/ DCCV 2/9; on Amiodarone   Chronic combined systolic and diastolic CHF, NYHA class 1 (HCC) -- Systolic dysfunction [id number][R00.76] 09/29/2014   Exacerbation 09/2014 2/2 Afib/flutter with RVR - 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 ([place]) 09/29/2014   a) [place] Echo: EF ~10% Severe LV dilation & global LV Systolic dysfxn, restrictive filling pattern - Gr 3 DD, mod RV dysfxn, severe LA dilation - 6.4 cm, mod RA dil, mod pl effusion, mod MR, mild TR; b) TEE 10/07/14: EF ~10%, Severe LV dilation & dysfxn Mod RV dysfxn, LAA oversewn, Mod RA & TR   Diverticulosis    H/O: rheumatic fever    HYPERLIPIDEMIA 04/05/2010   Qualifier: Diagnosis of  By: Lorelei Pont MD, [place]     Hypothyroid    Migraine headache    Osteopenia    Paroxysmal atrial fibrillation ([place]) 2005; Recurrent 09/2014   a) 2005: s/p Cox Maze & LAA ligation; b) 09/2014: TEE-cardioversion   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  Posterior MV leaflet is fixed and immobile.  MAC.  reduced excursion of the anterior MV leaflet.  Mean MV gradient was 38m Hg and MVA calculated at cm2.      Urinary incontinence    Past Surgical History:  Procedure Laterality Date   ABDOMINAL HYSTERECTOMY     CARDIAC CATHETERIZATION  Jan 2005   Pre-op R&LHC -- Nonobstructive CAD; normal PA pressures   CARDIOVERSION  06/04/2012   Procedure: CARDIOVERSION;  Surgeon: JCarlena Bjornstad MD;  [place]  Service: Cardiovascular;  Laterality: N/A;   CARDIOVERSION N/A 10/07/2014   Procedure: CARDIOVERSION;  Surgeon: Sueanne Margarita, MD;  Location: [facility];  Service: Cardiovascular;  Laterality: N/A;   COLONOSCOPY WITH PROPOFOL N/A 07/11/2017   Procedure: COLONOSCOPY WITH PROPOFOL;  Surgeon: Toledo, Benay Pike, MD;  Location: [facility] ENDOSCOPY;  Service: Gastroenterology;  Laterality: N/A;   COLONOSCOPY WITH PROPOFOL N/A  05/17/2022   Procedure: COLONOSCOPY WITH PROPOFOL;  Surgeon: Lucilla Lame, MD;  Location: [facility] ENDOSCOPY;  Service: Endoscopy;  Laterality: N/A;   LUMBAR DISC SURGERY     x 2 distantly   MITRAL VALVE REPAIR  2005   with [facility] Maze for [employer]   NM MYOVIEW LTD  4/7/'16   EF 37% with septal hypokinesis and diffuse hypokinesis. No ischemia or infarction. 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   RIGHT HEART CATHETERIZATION N/A 10/03/2014   Procedure: RIGHT HEART CATH;  Surgeon: Jolaine Artist, MD;  Location: [facility] CATH LAB;  Service: Cardiovascular;  RAP 98mHg, RVP 35/2/9 mmHg, PAP 45/12 mmHg, PCWP 16 mmHg; CO/I by Fick: 3.9/2.3; Ao/PA/SVC SaO2%: 97%/63%/65%.   TEE WITHOUT CARDIOVERSION N/A 10/07/2014   Procedure: TRANSESOPHAGEAL ECHOCARDIOGRAM (TEE);  Surgeon: TSueanne Margarita MD;  [place]  Service: Cardiovascular;  Laterality: N/A;   THYROIDECTOMY, PARTIAL     partial-no cancer; now on thyroid replacement   TRANSTHORACIC ECHOCARDIOGRAM  2013; 2/'16 & 5/'16   a) 2013: EF 60-65%, mild MR, Gr 2 DD; b) 2/'16 [place] [facility] EF ~10% - severel global LV dysfunction (systolic & diastolic) - dilated LV & restrictive filling pattern - Gr 3 DD, mod reduced RV function, severely dilated LA - 6.4 cm, mod dilated RA, mod pl effusion, mod MR, mild TR; c) 5/10/'16:  EF 25-30%, mod LV dilation, no RWMA,Gr 3 DD w/ high LAP, MV sewing ring intact w/ Mod MR, Severe LA dilation   TRANSTHORACIC ECHOCARDIOGRAM  January 2017:   EF improved to 45-50%. Mild diffuse HK. Mild MR. Moderate LA dilation. Normal PA pressures.    Current Meds  Medication Sig   amiodarone (PACERONE) 200 MG tablet Take 1 tablet (200 mg total) by mouth daily.   b complex vitamins tablet Take 1 tablet daily by mouth.   carvedilol (COREG) 3.125 MG tablet TAKE 1 TABLET BY MOUTH TWICE A DAY   cetirizine (ZYRTEC) 10 MG tablet Take 10 mg by mouth daily.   donepezil (ARICEPT) 5 MG tablet TAKE  1 TABLET BY MOUTH DAILY AT BEDTIME   fluticasone (FLONASE) 50 MCG/ACT nasal spray PLACE TWO SPRAYS INTO BOTH NOSTRILS DAILY   HYDROcodone-acetaminophen (NORCO/VICODIN) 5-325 MG tablet Take 1 tablet by mouth every 6 (six) hours as needed for moderate pain.   IRON, FERROUS SULFATE, PO Take 1 tablet by mouth daily.   levothyroxine (SYNTHROID) 75 MCG tablet TAKE 1 TAB BY MOUTH ONCE DAILY. TAKE ON AN EMPTY STOMACH WITH A GLASS OF WATER ATLEAST 30-60 MINUTES BEFORE BREAKFAST   losartan (COZAAR) 25 MG tablet TAKE 1 TABLET BY MOUTH ONCE DAILY   metroNIDAZOLE (METROCREAM) 0.75 % cream Apply topically 2 (two) times daily.   Omega-3 Fatty Acids (FISH OIL PO) Take 1 capsule by mouth daily.   permethrin (ELIMITE) 5 % cream APPLY PEA SIZED AMOUNT TO FACE ONCE A DAY   tolterodine (DETROL LA) 4 MG 24 hr capsule Take 1 capsule (4 mg total) by mouth daily.   traZODone (DESYREL) 50 MG tablet TAKE ONE HALF TO ONE TABLET BY MOUTH AT BEDTIME AS NEEDED  FOR SLEEP   warfarin (COUMADIN) 5 MG tablet TAKE 1/2 TO 1 TABLET BY MOUTH DAILY AS DIRECTED BY COUMADIN CLINIC    Allergies: Patient has no known allergies.  Social History   Tobacco Use   Smoking status: Never   Smokeless tobacco: Never  Vaping Use   Vaping Use: Never used  Substance Use Topics   Alcohol use: Not Currently   Drug use: No    Family History  Problem Relation Age of Onset   Diabetes Mother    Coronary artery disease Mother    Kidney disease Mother    Breast cancer Neg Hx     Review of Systems: A 12-system review of systems was performed and was negative except as noted in the HPI.  --------------------------------------------------------------------------------------------------  Physical Exam: BP 120/66 (BP Location: Left Arm, Patient Position: Sitting, Cuff Size: Large)   Pulse (!) 49   Ht 5' 7.5" (1.715 m)   Wt 174 lb (78.9 kg)   SpO2 98%   BMI 26.85 kg/m   General:  NAD. Neck: No JVD or HJR. Lungs: Clear to auscultation  bilaterally without wheezes or crackles. Heart: Bradycardic but regular with 1/6 systolic murmur. Abdomen: Soft, nontender, nondistended. Extremities: No lower extremity edema.  EKG: Sinus bradycardia with sinus arrhythmia and first-degree AV block (PR interval 260 ms) and lateral ST/T changes.  PR interval has lengthened slightly compared to a year ago.  Otherwise, no significant interval change.  Lab Results  Component Value Date   WBC 4.5 07/16/2021   HGB 12.7 07/16/2021   HCT 38.7 07/16/2021   MCV 101.0 (H) 07/16/2021   PLT 185 07/16/2021    Lab Results  Component Value Date   NA 141 11/29/2021   K 4.4 11/29/2021   CL 106 11/29/2021   CO2 28 11/29/2021   BUN 17 11/29/2021   CREATININE 1.28 (H) 11/29/2021   GLUCOSE 89 11/29/2021   ALT 16 11/29/2021    Lab Results  Component Value Date   CHOL 175 08/12/2020   HDL 56.40 08/12/2020   LDLCALC 84 08/12/2020   LDLDIRECT 132.3 03/31/2010   TRIG 173.0 (H) 08/12/2020   CHOLHDL 3 08/12/2020    --------------------------------------------------------------------------------------------------  ASSESSMENT AND PLAN: Paroxysmal atrial fibrillation: Ms. Berzins remains in sinus rhythm and has not had any symptoms to suggest a recurrence over the last year.  I remain concerned about her sinus bradycardia and a lengthening PR interval.  We have agreed to decrease amiodarone to 100 mg daily.  We also discussed discontinuation of carvedilol, though given her history of cardiomyopathy, we will continue this for now and have her return in 3 months to reassess her heart rate and first-degree AV block.  I will reach out to our anticoagulation clinic for further recommendations regarding warfarin dosing/INR follow-up in the setting of amiodarone dose change.  I will check a CMP, CBC, TSH, and chest radiograph today, particularly given mild shortness of breath with activities reported today.  Valvular heart disease and shortness of breath: Ms.  Charlcie [name] appears euvolemic on examination today.  She notes some mild progression in exertional shortness of breath, which may be driven by her bradycardia.  As above, we will decrease amiodarone and check a chest x-ray as well as routine labs.  If symptoms do not improve with de-escalation of amiodarone, we will need to consider repeating an echocardiogram at our next visit.  Nonischemic cardiomyopathy and HFrEF with recovered ejection fraction: [name] appears euvolemic with NYHA class II symptoms.  If exertional  dyspnea does not improve with de-escalation of amiodarone, echo should be considered at follow-up visit.  Continue low-dose carvedilol with close monitoring of PR interval at follow-up, as well as losartan.  Follow-up: Return to clinic in 3 months.  Nelva Bush, MD 07/29/2022 9:22 AM

## 2022-07-29 NOTE — Patient Instructions (Addendum)
Medication Instructions:  DECREASE Amiodarone to 100 mg by mouth daily  *If you need a refill on your cardiac medications before your next appointment, please call your pharmacy*   Lab Work: Your provider would like for you to have following labs drawn today: (CBC, CMP, TSH).   Please go to the [facility] entrance and check in at the front desk.  You do not need an appointment.  They are open from 7am-6 pm.     Testing/Procedures: Chest X-ray [facility]   Follow-Up: At [facility], you and your health needs are our priority.  As part of our continuing mission to provide you with exceptional heart care, we have created designated Provider Care Teams.  These Care Teams include your primary Cardiologist (physician) and Advanced Practice Providers (APPs -  Physician Assistants and Nurse Practitioners) who all work together to provide you with the care you need, when you need it.  We recommend signing up for the patient portal called "MyChart".  Sign up information is provided on this After Visit Summary.  MyChart is used to connect with patients for Virtual Visits (Telemedicine).  Patients are able to view lab/test results, encounter notes, upcoming appointments, etc.  Non-urgent messages can be sent to your provider as well.   To learn more about what you can do with MyChart, go to [email or web address].    Your next appointment:   3 month(s)  The format for your next appointment:   In Person  Provider:   You may see Nelva Bush, MD or one of the following Advanced Practice Providers on your designated Care Team:   Murray Hodgkins, NP Christell Faith, PA-C Cadence Kathlen Mody, PA-C Gerrie Nordmann, NP

## 2022-07-30 ENCOUNTER — Encounter: Payer: Self-pay | Admitting: Internal Medicine

## 2022-07-30 DIAGNOSIS — I5020 Unspecified systolic (congestive) heart failure: Secondary | ICD-10-CM | POA: Insufficient documentation

## 2022-07-30 DIAGNOSIS — R0602 Shortness of breath: Secondary | ICD-10-CM | POA: Insufficient documentation

## 2022-08-10 ENCOUNTER — Ambulatory Visit: Payer: Medicare HMO | Attending: Internal Medicine

## 2022-08-10 DIAGNOSIS — Z5181 Encounter for therapeutic drug level monitoring: Secondary | ICD-10-CM

## 2022-08-10 DIAGNOSIS — I480 Paroxysmal atrial fibrillation: Secondary | ICD-10-CM

## 2022-08-10 LAB — POCT INR: INR: 2.4 (ref 2.0–3.0)

## 2022-08-10 NOTE — Patient Instructions (Signed)
continue dosage of warfarin 1 tablet daily EXCEPT 1/2 tablet on [place]. - recheck in 6 weeks.

## 2022-08-15 ENCOUNTER — Ambulatory Visit (INDEPENDENT_AMBULATORY_CARE_PROVIDER_SITE_OTHER): Payer: Medicare HMO

## 2022-08-15 VITALS — Ht 67.5 in | Wt 174.0 lb

## 2022-08-15 DIAGNOSIS — Z0000 Encounter for general adult medical examination without abnormal findings: Secondary | ICD-10-CM | POA: Diagnosis not present

## 2022-08-15 NOTE — Patient Instructions (Addendum)
[name] , Thank you for taking time to come for your Medicare Wellness Visit. I appreciate your ongoing commitment to your health goals. Please review the following plan we discussed and let me know if I can assist you in the future.   These are the goals we discussed:  Goals Addressed               This Visit's Progress     Patient Stated     Increase water intake (pt-stated)   On track      I will continue to drink at least 6-8 glasses of water daily.          This is a list of the screening recommended for you and due dates:  Health Maintenance  Topic Date Due   COVID-19 Vaccine (5 - 2023-24 season) 08/31/2022*   Medicare Annual Wellness Visit  08/16/2023   DTaP/Tdap/Td vaccine (2 - Tdap) 02/26/2029   Pneumonia Vaccine  Completed   Flu Shot  Completed   DEXA scan (bone density measurement)  Completed   Hepatitis C Screening: USPSTF Recommendation to screen - Ages 52-79 yo.  Completed   Zoster (Shingles) Vaccine  Completed   HPV Vaccine  Aged Out   Colon Cancer Screening  Discontinued  *Topic was postponed. The date shown is not the original due date.    Advanced directives: End of life planning; Advanced aging; Advanced directives discussed.  No HCPOA/Living Will.  Additional information in office. Declined at this time.  Conditions/risks identified: none new  Next appointment: Follow up in one year for your annual wellness visit    Preventive Care 65 Years and Older, Female Preventive care refers to lifestyle choices and visits with your health care provider that can promote health and wellness. What does preventive care include? A yearly physical exam. This is also called an annual well check. Dental exams once or twice a year. Routine eye exams. Ask your health care provider how often you should have your eyes checked. Personal lifestyle choices, including: Daily care of your teeth and gums. Regular physical activity. Eating a healthy diet. Avoiding  tobacco and drug use. Limiting alcohol use. Practicing safe sex. Taking low-dose aspirin every day. Taking vitamin and mineral supplements as recommended by your health care provider. What happens during an annual well check? The services and screenings done by your health care provider during your annual well check will depend on your age, overall health, lifestyle risk factors, and family history of disease. Counseling  Your health care provider may ask you questions about your: Alcohol use. Tobacco use. Drug use. Emotional well-being. Home and relationship well-being. Sexual activity. Eating habits. History of falls. Memory and ability to understand (cognition). Work and work Statistician. Reproductive health. Screening  You may have the following tests or measurements: Height, weight, and BMI. Blood pressure. Lipid and cholesterol levels. These may be checked every 5 years, or more frequently if you are over 38 years old. Skin check. Lung cancer screening. You may have this screening every year starting at age 44 if you have a 30-pack-year history of smoking and currently smoke or have quit within the past 15 years. Fecal occult blood test (FOBT) of the stool. You may have this test every year starting at age 86. Flexible sigmoidoscopy or colonoscopy. You may have a sigmoidoscopy every 5 years or a colonoscopy every 10 years starting at age 1. Hepatitis C blood test. Hepatitis B blood test. Sexually transmitted disease (STD) testing. Diabetes screening. This  is done by checking your blood sugar (glucose) after you have not eaten for a while (fasting). You may have this done every 1-3 years. Bone density scan. This is done to screen for osteoporosis. You may have this done starting at age 38. Mammogram. This may be done every 1-2 years. Talk to your health care provider about how often you should have regular mammograms. Talk with your health care provider about your test  results, treatment options, and if necessary, the need for more tests. Vaccines  Your health care provider may recommend certain vaccines, such as: Influenza vaccine. This is recommended every year. Tetanus, diphtheria, and acellular pertussis (Tdap, Td) vaccine. You may need a Td booster every 10 years. Zoster vaccine. You may need this after age 50. Pneumococcal 13-valent conjugate (PCV13) vaccine. One dose is recommended after age 59. Pneumococcal polysaccharide (PPSV23) vaccine. One dose is recommended after age 65. Talk to your health care provider about which screenings and vaccines you need and how often you need them. This information is not intended to replace advice given to you by your health care provider. Make sure you discuss any questions you have with your health care provider. Document Released: 09/11/2015 Document Revised: 05/04/2016 Document Reviewed: 06/16/2015 Elsevier Interactive Patient Education  2017 [place] Prevention in the Home Falls can cause injuries. They can happen to people of all ages. There are many things you can do to make your home safe and to help prevent falls. What can I do on the outside of my home? Regularly fix the edges of walkways and driveways and fix any cracks. Remove anything that might make you trip as you walk through a door, such as a raised step or threshold. Trim any bushes or trees on the path to your home. Use bright outdoor lighting. Clear any walking paths of anything that might make someone trip, such as rocks or tools. Regularly check to see if handrails are loose or broken. Make sure that both sides of any steps have handrails. Any raised decks and porches should have guardrails on the edges. Have any leaves, snow, or ice cleared regularly. Use sand or salt on walking paths during winter. Clean up any spills in your garage right away. This includes oil or grease spills. What can I do in the bathroom? Use night  lights. Install grab bars by the toilet and in the tub and shower. Do not use towel bars as grab bars. Use non-skid mats or decals in the tub or shower. If you need to sit down in the shower, use a plastic, non-slip stool. Keep the floor dry. Clean up any water that spills on the floor as soon as it happens. Remove soap buildup in the tub or shower regularly. Attach bath mats securely with double-sided non-slip rug tape. Do not have throw rugs and other things on the floor that can make you trip. What can I do in the bedroom? Use night lights. Make sure that you have a light by your bed that is easy to reach. Do not use any sheets or blankets that are too big for your bed. They should not hang down onto the floor. Have a firm chair that has side arms. You can use this for support while you get dressed. Do not have throw rugs and other things on the floor that can make you trip. What can I do in the kitchen? Clean up any spills right away. Avoid walking on wet floors. Keep items that  you use a lot in easy-to-reach places. If you need to reach something above you, use a strong step stool that has a grab bar. Keep electrical cords out of the way. Do not use floor polish or wax that makes floors slippery. If you must use wax, use non-skid floor wax. Do not have throw rugs and other things on the floor that can make you trip. What can I do with my stairs? Do not leave any items on the stairs. Make sure that there are handrails on both sides of the stairs and use them. Fix handrails that are broken or loose. Make sure that handrails are as long as the stairways. Check any carpeting to make sure that it is firmly attached to the stairs. Fix any carpet that is loose or worn. Avoid having throw rugs at the top or bottom of the stairs. If you do have throw rugs, attach them to the floor with carpet tape. Make sure that you have a light switch at the top of the stairs and the bottom of the stairs. If  you do not have them, ask someone to add them for you. What else can I do to help prevent falls? Wear shoes that: Do not have high heels. Have rubber bottoms. Are comfortable and fit you well. Are closed at the toe. Do not wear sandals. If you use a stepladder: Make sure that it is fully opened. Do not climb a closed stepladder. Make sure that both sides of the stepladder are locked into place. Ask someone to hold it for you, if possible. Clearly mark and make sure that you can see: Any grab bars or handrails. First and last steps. Where the edge of each step is. Use tools that help you move around (mobility aids) if they are needed. These include: Canes. Walkers. Scooters. Crutches. Turn on the lights when you go into a dark area. Replace any light bulbs as soon as they burn out. Set up your furniture so you have a clear path. Avoid moving your furniture around. If any of your floors are uneven, fix them. If there are any pets around you, be aware of where they are. Review your medicines with your doctor. Some medicines can make you feel dizzy. This can increase your chance of falling. Ask your doctor what other things that you can do to help prevent falls. This information is not intended to replace advice given to you by your health care provider. Make sure you discuss any questions you have with your health care provider. Document Released: 06/11/2009 Document Revised: 01/21/2016 Document Reviewed: 09/19/2014 Elsevier Interactive Patient Education  2017 [employer].

## 2022-08-15 NOTE — Progress Notes (Signed)
Subjective:   [name] is a 76 y.o. female who presents for Medicare Annual (Subsequent) preventive examination.  Review of Systems    No ROS.  Medicare Wellness Virtual Visit.  Visual/audio telehealth visit, UTA vital signs.   See social history for additional risk factors.   Cardiac Risk Factors include: advanced age (>11mn, >>28women);hypertension     Objective:    Today's Vitals   08/15/22 1031  Weight: 174 lb (78.9 kg)  Height: 5' 7.5" (1.715 m)   Body mass index is 26.85 kg/m.     08/15/2022   10:47 AM 05/17/2022    8:07 AM 02/09/2021   11:08 AM 08/14/2020    8:19 AM 05/27/2019    9:15 AM 05/18/2018    8:49 AM 07/11/2017   12:47 PM  Advanced Directives  Does Patient Have a Medical Advance Directive? No No No Yes Yes Yes Yes  Type of AScientist, research (medical)Living will [place] Out of facility DNR (pink MOST or yellow form)  Copy of [place] Chart?    No - copy requested No - copy requested No - copy requested   Would patient like information on creating a medical advance directive? No - Patient declined No - Patient declined No - Patient declined        Current Medications (verified) Outpatient Encounter Medications as of 08/15/2022  Medication Sig   amiodarone (PACERONE) 100 MG tablet Take 1 tablet (100 mg total) by mouth daily.   b complex vitamins tablet Take 1 tablet daily by mouth.   Calcium Citrate-Vitamin D (CALCIUM + D PO) Take 1 capsule daily by mouth.  (Patient not taking: Reported on 07/29/2022)   carvedilol (COREG) 3.125 MG tablet TAKE 1 TABLET BY MOUTH TWICE A DAY   cetirizine (ZYRTEC) 10 MG tablet Take 10 mg by mouth daily.   donepezil (ARICEPT) 5 MG tablet TAKE 1 TABLET BY MOUTH DAILY AT BEDTIME   fluticasone (FLONASE) 50 MCG/ACT nasal spray PLACE TWO SPRAYS INTO BOTH NOSTRILS DAILY   HYDROcodone-acetaminophen  (NORCO/VICODIN) 5-325 MG tablet Take 1 tablet by mouth every 6 (six) hours as needed for moderate pain. (Patient not taking: Reported on 08/15/2022)   IRON, FERROUS SULFATE, PO Take 1 tablet by mouth daily.   levothyroxine (SYNTHROID) 75 MCG tablet TAKE 1 TAB BY MOUTH ONCE DAILY. TAKE ON AN EMPTY STOMACH WITH A GLASS OF WATER ATLEAST 30-60 MINUTES BEFORE BREAKFAST   losartan (COZAAR) 25 MG tablet TAKE 1 TABLET BY MOUTH ONCE DAILY   metroNIDAZOLE (METROCREAM) 0.75 % cream Apply topically 2 (two) times daily.   Omega-3 Fatty Acids (FISH OIL PO) Take 1 capsule by mouth daily.   permethrin (ELIMITE) 5 % cream APPLY PEA SIZED AMOUNT TO FACE ONCE A DAY   tolterodine (DETROL LA) 4 MG 24 hr capsule Take 1 capsule (4 mg total) by mouth daily.   traZODone (DESYREL) 50 MG tablet TAKE ONE HALF TO ONE TABLET BY MOUTH AT BEDTIME AS NEEDED FOR SLEEP   warfarin (COUMADIN) 5 MG tablet TAKE 1/2 TO 1 TABLET BY MOUTH DAILY AS DIRECTED BY COUMADIN CLINIC   No facility-administered encounter medications on file as of 08/15/2022.    Allergies (verified) Patient has no known allergies.   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[place]) 09/29/2014   a) [place] Echo: EF ~10% Severe LV dilation & global LV Systolic dysfxn, restrictive filling pattern - Gr 3 DD, mod RV dysfxn, severe LA dilation - 6.4 cm, mod  RA dil, mod pl effusion, mod MR, mild TR; b) TEE 10/07/14: EF ~10%, Severe LV dilation & dysfxn Mod RV dysfxn, LAA oversewn, Mod RA & TR   Diverticulosis    H/O: rheumatic fever    HYPERLIPIDEMIA 04/05/2010   Qualifier: Diagnosis of  By: Lorelei Pont MD, [place]     Hypothyroid    Migraine headache    Osteopenia    Paroxysmal atrial fibrillation ([place]) 2005; Recurrent 09/2014   a) 2005: s/p Cox Maze & LAA ligation; b) 09/2014: TEE-cardioversion   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  Posterior MV leaflet is fixed and immobile.  MAC.  reduced excursion of the anterior MV leaflet.  Mean MV gradient was 15m Hg and MVA calculated at cm2.      Urinary incontinence    Past Surgical History:  Procedure Laterality Date   ABDOMINAL HYSTERECTOMY     CARDIAC CATHETERIZATION  Jan 2005   Pre-op R&LHC -- Nonobstructive CAD; normal PA pressures   CARDIOVERSION  06/04/2012   Procedure: CARDIOVERSION;  Surgeon: JCarlena Bjornstad MD;  [place] [facility] [place]  Service: Cardiovascular;  Laterality: N/A;   CARDIOVERSION N/A 10/07/2014   Procedure: CARDIOVERSION;  Surgeon: TSueanne Margarita MD;  [place]  Service: Cardiovascular;  Laterality: N/A;   COLONOSCOPY WITH PROPOFOL N/A 07/11/2017   Procedure: COLONOSCOPY WITH PROPOFOL;  Surgeon: [place], TBenay Pike MD;  Location: [facility] ENDOSCOPY;  Service: Gastroenterology;  Laterality: N/A;   COLONOSCOPY WITH PROPOFOL N/A 05/17/2022   Procedure: COLONOSCOPY WITH PROPOFOL;  Surgeon: WLucilla Lame MD;  [facility]  Service: Endoscopy;  Laterality: N/A;   LUMBAR DISC SURGERY     x 2 distantly   MITRAL VALVE REPAIR  2005   with [facility] Maze for [employer]  [facility] [employer]  4/7/'16   EF 37% with septal hypokinesis and diffuse hypokinesis. No ischemia or infarction. 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   RIGHT HEART CATHETERIZATION N/A 10/03/2014   Procedure: RIGHT HEART CATH;  Surgeon: DJolaine Artist MD;  [facility];  Service: Cardiovascular;  RAP 533mg, RVP 35/2/9 mmHg, PAP 45/12 mmHg, PCWP 16 mmHg; CO/I by Fick: 3.9/2.3; Ao/PA/SVC SaO2%: 97%/63%/65%.   TEE WITHOUT CARDIOVERSION N/A 10/07/2014   Procedure: TRANSESOPHAGEAL ECHOCARDIOGRAM (TEE);  Surgeon: TrSueanne MargaritaMD;  [facility];  Service: Cardiovascular;  Laterality: N/A;   THYROIDECTOMY, PARTIAL     partial-no cancer; now on thyroid replacement   TRANSTHORACIC ECHOCARDIOGRAM  2013; 2/'16 & 5/'16   a) 2013: EF 60-65%, mild MR, Gr 2 DD;  b) 2/'16 @ [facility]: EF ~10% - severel global LV dysfunction (systolic & diastolic) - dilated LV & restrictive filling pattern - Gr 3 DD, mod reduced RV function, severely dilated LA - 6.4 cm, mod dilated RA, mod pl effusion, mod MR, mild TR; c) 5/10/'16:  EF 25-30%, mod LV dilation, no RWMA,Gr 3 DD w/ high LAP, MV sewing ring intact w/ Mod MR, Severe LA dilation   TRANSTHORACIC ECHOCARDIOGRAM  January 2017:   EF improved to 45-50%. Mild diffuse HK. Mild MR. Moderate LA dilation. Normal PA pressures.   Family History  Problem Relation Age of Onset   Diabetes Mother    Coronary artery disease Mother    Kidney disease Mother    Breast cancer Neg Hx    Social History   Socioeconomic History   Marital status: Married    Spouse name: Not on file   Number of children: 2   Years of education: Not on file   Highest education level: Not on file  Occupational History   Occupation: Retired  Tobacco Use   Smoking status: Never   Smokeless tobacco: Never  Vaping Use   Vaping Use: Never used  Substance and Sexual Activity   Alcohol use: Not Currently   Drug use: No   Sexual activity: Yes  Other Topics Concern   Not on file  Social History Narrative   Not on file   Social Determinants  of Health   Financial Resource Strain: Low Risk  (08/15/2022)   Overall Financial Resource Strain (CARDIA)    Difficulty of Paying Living Expenses: Not hard at all  Food Insecurity: No Food Insecurity (08/15/2022)   Hunger Vital Sign    Worried About Running Out of Food in the Last Year: Never true    [place] in the Last Year: Never true  Transportation Needs: No Transportation Needs (08/15/2022)   PRAPARE - Hydrologist (Medical): No    Lack of Transportation (Non-Medical): No  Physical Activity: Inactive (08/14/2020)   Exercise Vital Sign    Days of Exercise per Week: 0 days    Minutes of Exercise per Session: 0 min  Stress: No Stress Concern Present (08/15/2022)   [place]    Feeling of Stress : Not at all  Social Connections: Unknown (08/15/2022)   Social Connection and Isolation Panel [NHANES]    Frequency of Communication with Friends and Family: Not on file    Frequency of Social Gatherings with Friends and Family: Not on file    Attends Religious Services: Not on file    Active Member of Clubs or Organizations: Not on file    Attends Archivist Meetings: Not on file    Marital Status: Married    Tobacco Counseling Counseling given: Not Answered   Clinical Intake:  Pre-visit preparation completed: Yes        Diabetes: No  How often do you need to have someone help you when you read instructions, pamphlets, or other written materials from your doctor or pharmacy?: 1 - Never    Interpreter Needed?: No     Activities of Daily Living    08/15/2022   10:32 AM  In your present state of health, do you have any difficulty performing the following activities:  Hearing? 1  Comment Hearing aids  Vision? 0  Comment Dx Mild Dementia. Taking medication as directed.  Walking or climbing stairs? [place]  self.  Dressing or bathing? 0  Doing  errands, shopping? 0  Preparing Food and eating ? N  Using the Toilet? N  In the past six months, have you accidently leaked urine? Y  Comment Followed by PCP. Managed with PCP and daily pad/brief.  Do you have problems with loss of bowel control? N  Managing your Medications? N  Managing your Finances? N  Housekeeping or managing your Housekeeping? N    Patient Care Team: Owens Loffler, MD as PCP - General (Family Medicine) End, Harrell Gave, MD as PCP - Cardiology (Cardiology)  Indicate any recent Medical Services you may have received from other than [facility] providers in the past year (date may be approximate).     Assessment:   This is a routine wellness examination for [name].  I connected with  [name] on 08/15/22 by a audio enabled telemedicine application and verified that I am speaking with the correct person using two identifiers.  Patient Location: Home  Provider Location: Office/Clinic  I discussed the limitations of evaluation and management by telemedicine. The patient expressed understanding and agreed to proceed.   Hearing/Vision screen Hearing Screening - Comments:: Hearing aids Vision Screening - Comments:: [facility] exam 2023 [facility]  Dietary issues and exercise activities discussed: Current Exercise Habits: Home exercise routine, Type of exercise: stretching, Intensity: Mild Regular diet   Goals Addressed               This Visit's Progress     Patient Stated     Increase water intake (pt-stated)   On track      I will continue to drink at least 6-8 glasses of water daily.        Depression Screen    08/15/2022   10:40 AM 08/11/2021    2:08 PM 08/14/2020    8:24 AM 05/27/2019    9:16 AM 05/18/2018    8:38 AM 05/16/2017   11:30 AM 03/04/2016    2:12 PM  PHQ 2/9 Scores  PHQ - 2 Score 0 0 0 0 0 0 0  PHQ- 9 Score   0 0 0 2     Fall Risk    08/15/2022   10:41 AM 08/14/2020    8:21 AM 05/27/2019    9:16 AM 05/18/2018     8:38 AM 05/16/2017   11:30 AM  Fall Risk   Falls in the past year? 1 1 0 No No  Comment  has knee pain and has went down a few times due to this     Number falls in past yr: 0 1 0    Injury with Fall? 0 0     Comment Slipped in the mud. Did not seek medical care. Denies injury.      Risk for fall due to : Impaired balance/gait Medication side effect Medication side effect    Follow up Falls evaluation completed;Falls prevention discussed Falls evaluation completed;Falls prevention discussed Falls evaluation completed;Falls prevention discussed      FALL RISK PREVENTION PERTAINING TO THE HOME: Home free of loose throw rugs in walkways, pet beds, electrical cords, etc? Yes  Adequate lighting in your home to reduce risk of falls? Yes   ASSISTIVE DEVICES UTILIZED TO PREVENT FALLS: Life alert? No  Use of a cane, walker or w/c? No  Grab bars in the bathroom? Yes  Shower chair or bench in shower? No. Elevated toilet seat or a handicapped toilet? Yes   TIMED UP AND GO: Was  the test performed? No .   Cognitive Function:    08/14/2020    8:30 AM 05/27/2019    9:19 AM 05/20/2018   11:54 AM 05/18/2018    8:39 AM 05/16/2017   11:28 AM  MMSE - Mini Mental State Exam  Orientation to time _0 Orientation to Place _1 Registration _2 Attention/ Calculation 5 5  0 0  Recall _3 Language- name 2 objects    0 0  Language- repeat _4 Language- follow 3 step command    3 3  Language- read & follow direction    0 0  Write a sentence    0 0  Copy design    0 0  Total score    20 20        08/15/2022   10:44 AM  6CIT Screen  What Year? 0 points  What month? 0 points  What time? 0 points  Count back from 20 0 points  Months in reverse 0 points  Repeat phrase 0 points  Total Score 0 points    Immunizations Immunization History  Administered Date(s) Administered   Fluad Quad(high Dose 65+) 07/29/2020, 05/26/2022   Influenza Split 05/11/2011    Influenza Whole 06/30/2010   Influenza, High Dose Seasonal PF 06/27/2015, 05/01/2018, 04/28/2019, 06/10/2021   Influenza,inj,Quad PF,6+ Mos 07/29/2013, 05/27/2016, 05/16/2017   PFIZER(Purple Top)SARS-COV-2 Vaccination 09/02/2019, 09/23/2019, 06/02/2020   Pfizer Covid-19 Vaccine Bivalent Booster 69yr & up 06/10/2021   Pneumococcal Conjugate-13 08/26/2015   Pneumococcal Polysaccharide-23 04/05/2010   Td 02/27/2019   Zoster Recombinat (Shingrix) 03/29/2018, 08/25/2018   Covid-19 vaccine status: Completed vaccines x4.   Screening Tests Health Maintenance  Topic Date Due   COVID-19 Vaccine (5 - 2023-24 season) 08/31/2022 (Originally 04/29/2022)   Medicare Annual Wellness (AWV)  08/16/2023   DTaP/Tdap/Td (2 - Tdap) 02/26/2029   Pneumonia Vaccine 76 Years old  Completed   INFLUENZA VACCINE  Completed   DEXA SCAN  Completed   Hepatitis C Screening  Completed   Zoster Vaccines- Shingrix  Completed   HPV VACCINES  Aged Out   COLONOSCOPY (Pts 45-443yrInsurance coverage will need to be confirmed)  Discontinued   Health Maintenance There are no preventive care reminders to display for this patient.  DG Chest 2 View- completed 07/31/22.  Hepatitis C Screening: Completed 2017.  Vision Screening: Recommended annual ophthalmology exams for early detection of glaucoma and other disorders of the eye.  Dental Screening: Recommended annual dental exams for proper oral hygiene.  Community Resource Referral / Chronic Care Management: CRR required this visit?  No   CCM required this visit?  No      Plan:     I have personally reviewed and noted the following in the patient's chart:   Medical and social history Use of alcohol, tobacco or illicit drugs  Current medications and supplements including opioid prescriptions. Patient is not currently taking opioid prescriptions. Functional ability and status Nutritional status Physical activity Advanced directives List of other  physicians Hospitalizations, surgeries, and ER visits in previous 12 months Vitals Screenings to include cognitive, depression, and falls Referrals and appointments  In addition, I have reviewed and discussed with patient certain preventive protocols, quality metrics, and best practice recommendations. A written personalized care plan for preventive services as well as general preventive health recommendations were provided to patient.     Jorel Gravlin  Leda Roys, LPN   [id number]

## 2022-08-19 NOTE — Progress Notes (Signed)
I connected with  [name] on 08/15/2022  by a audio enabled telemedicine application and verified that I am speaking with the correct person using two identifiers.  Patient Location: Home  Provider Location: Home Office  I discussed the limitations of evaluation and management by telemedicine. The patient expressed understanding and agreed to proceed.

## 2022-08-30 ENCOUNTER — Other Ambulatory Visit: Payer: Self-pay | Admitting: Family Medicine

## 2022-09-12 ENCOUNTER — Other Ambulatory Visit: Payer: Self-pay | Admitting: Family Medicine

## 2022-09-12 NOTE — Telephone Encounter (Signed)
Patient scheduled.

## 2022-09-12 NOTE — Telephone Encounter (Signed)
Please schedule CPE with fasting labs prior for Dr. Lorelei Pont.

## 2022-09-17 ENCOUNTER — Other Ambulatory Visit: Payer: Self-pay | Admitting: Family Medicine

## 2022-09-17 DIAGNOSIS — E782 Mixed hyperlipidemia: Secondary | ICD-10-CM

## 2022-09-17 DIAGNOSIS — E559 Vitamin D deficiency, unspecified: Secondary | ICD-10-CM

## 2022-09-17 DIAGNOSIS — E039 Hypothyroidism, unspecified: Secondary | ICD-10-CM

## 2022-09-17 DIAGNOSIS — Z79899 Other long term (current) drug therapy: Secondary | ICD-10-CM

## 2022-09-19 ENCOUNTER — Other Ambulatory Visit: Payer: Self-pay | Admitting: Family Medicine

## 2022-09-19 ENCOUNTER — Other Ambulatory Visit: Payer: Self-pay | Admitting: Internal Medicine

## 2022-09-20 ENCOUNTER — Other Ambulatory Visit (INDEPENDENT_AMBULATORY_CARE_PROVIDER_SITE_OTHER): Payer: Medicare HMO

## 2022-09-20 DIAGNOSIS — E559 Vitamin D deficiency, unspecified: Secondary | ICD-10-CM

## 2022-09-20 DIAGNOSIS — E039 Hypothyroidism, unspecified: Secondary | ICD-10-CM

## 2022-09-20 DIAGNOSIS — Z79899 Other long term (current) drug therapy: Secondary | ICD-10-CM

## 2022-09-20 DIAGNOSIS — E782 Mixed hyperlipidemia: Secondary | ICD-10-CM | POA: Diagnosis not present

## 2022-09-20 LAB — CBC WITH DIFFERENTIAL/PLATELET
Basophils Absolute: 0 10*3/uL (ref 0.0–0.1)
Basophils Relative: 0.8 % (ref 0.0–3.0)
Eosinophils Absolute: 0.2 10*3/uL (ref 0.0–0.7)
Eosinophils Relative: 3.8 % (ref 0.0–5.0)
HCT: 39.4 % (ref 36.0–46.0)
Hemoglobin: 13.1 g/dL (ref 12.0–15.0)
Lymphocytes Relative: 30.5 % (ref 12.0–46.0)
Lymphs Abs: 1.5 10*3/uL (ref 0.7–4.0)
MCHC: 33.3 g/dL (ref 30.0–36.0)
MCV: 98.6 fl (ref 78.0–100.0)
Monocytes Absolute: 0.3 10*3/uL (ref 0.1–1.0)
Monocytes Relative: 7.1 % (ref 3.0–12.0)
Neutro Abs: 2.8 10*3/uL (ref 1.4–7.7)
Neutrophils Relative %: 57.8 % (ref 43.0–77.0)
Platelets: 232 10*3/uL (ref 150.0–400.0)
RBC: 4 Mil/uL (ref 3.87–5.11)
RDW: 14.2 % (ref 11.5–15.5)
WBC: 4.8 10*3/uL (ref 4.0–10.5)

## 2022-09-20 LAB — LIPID PANEL
Cholesterol: 213 mg/dL — ABNORMAL HIGH (ref 0–200)
HDL: 58.3 mg/dL (ref >39.00)
LDL Cholesterol: 122 mg/dL — ABNORMAL HIGH (ref 0–99)
NonHDL: 154.52
Total CHOL/HDL Ratio: 4
Triglycerides: 165 mg/dL — ABNORMAL HIGH (ref 0.0–149.0)
VLDL: 33 mg/dL (ref 0.0–40.0)

## 2022-09-20 LAB — HEPATIC FUNCTION PANEL
ALT: 15 U/L (ref 0–35)
AST: 16 U/L (ref 0–37)
Albumin: 4.2 g/dL (ref 3.5–5.2)
Alkaline Phosphatase: 65 U/L (ref 39–117)
Bilirubin, Direct: 0.1 mg/dL (ref 0.0–0.3)
Total Bilirubin: 0.8 mg/dL (ref 0.2–1.2)
Total Protein: 6.4 g/dL (ref 6.0–8.3)

## 2022-09-20 LAB — BASIC METABOLIC PANEL
BUN: 14 mg/dL (ref 6–23)
CO2: 28 mEq/L (ref 19–32)
Calcium: 9.1 mg/dL (ref 8.4–10.5)
Chloride: 104 mEq/L (ref 96–112)
Creatinine, Ser: 0.84 mg/dL (ref 0.40–1.20)
GFR: 66.76 mL/min (ref >60.00)
Glucose, Bld: 89 mg/dL (ref 70–99)
Potassium: 5 mEq/L (ref 3.5–5.1)
Sodium: 139 mEq/L (ref 135–145)

## 2022-09-20 LAB — T3, FREE: T3, Free: 2.7 pg/mL (ref 2.3–4.2)

## 2022-09-20 LAB — TSH: TSH: 4.4 u[IU]/mL (ref 0.35–5.50)

## 2022-09-20 LAB — VITAMIN D 25 HYDROXY (VIT D DEFICIENCY, FRACTURES): VITD: 50.49 ng/mL (ref 30.00–100.00)

## 2022-09-20 LAB — T4, FREE: Free T4: 1.18 ng/dL (ref 0.60–1.60)

## 2022-09-20 NOTE — Telephone Encounter (Signed)
Refill request

## 2022-09-20 NOTE — Telephone Encounter (Signed)
Refill request for warfarin:  Last INR was 2.4 on 08/10/22 Next INR due on 09/21/22 LOV was 07/29/22  Refill approved.

## 2022-09-21 ENCOUNTER — Ambulatory Visit: Payer: Medicare HMO | Attending: Internal Medicine

## 2022-09-21 DIAGNOSIS — I480 Paroxysmal atrial fibrillation: Secondary | ICD-10-CM

## 2022-09-21 DIAGNOSIS — Z5181 Encounter for therapeutic drug level monitoring: Secondary | ICD-10-CM

## 2022-09-21 LAB — POCT INR: INR: 1.6 — AB (ref 2.0–3.0)

## 2022-09-21 NOTE — Patient Instructions (Signed)
TAKE 2 TABLETS TODAY ONLY THEN continue dosage of warfarin 1 tablet daily EXCEPT 1/2 tablet on [place]. - recheck in 4 weeks.

## 2022-09-26 ENCOUNTER — Ambulatory Visit (INDEPENDENT_AMBULATORY_CARE_PROVIDER_SITE_OTHER): Payer: Medicare HMO | Admitting: Family Medicine

## 2022-09-26 ENCOUNTER — Encounter: Payer: Self-pay | Admitting: Family Medicine

## 2022-09-26 VITALS — BP 104/60 | HR 52 | Temp 97.2°F | Ht 67.5 in | Wt 180.0 lb

## 2022-09-26 DIAGNOSIS — Z0001 Encounter for general adult medical examination with abnormal findings: Secondary | ICD-10-CM | POA: Diagnosis not present

## 2022-09-26 DIAGNOSIS — E785 Hyperlipidemia, unspecified: Secondary | ICD-10-CM

## 2022-09-26 DIAGNOSIS — M1711 Unilateral primary osteoarthritis, right knee: Secondary | ICD-10-CM

## 2022-09-26 DIAGNOSIS — R1012 Left upper quadrant pain: Secondary | ICD-10-CM

## 2022-09-26 MED ORDER — TRIAMCINOLONE ACETONIDE 40 MG/ML IJ SUSP
40.0000 mg | Freq: Once | INTRAMUSCULAR | Status: AC
  Administered 2022-09-26: 40 mg via INTRA_ARTICULAR

## 2022-09-26 MED ORDER — ROSUVASTATIN CALCIUM 10 MG PO TABS: 10.0000 mg | ORAL_TABLET | Freq: Every day | ORAL | 3 refills | Status: DC

## 2022-09-26 MED ORDER — FLUTICASONE PROPIONATE 50 MCG/ACT NA SUSP: NASAL | 3 refills | Status: DC

## 2022-09-26 NOTE — Patient Instructions (Addendum)
For reflux:  if it is bad take some Pepcid AC over the counter.   Cough:  Mucinex every 12 hours

## 2022-09-26 NOTE — Progress Notes (Signed)
Danyela Posas T. Juanantonio Stolar, MD, [place] [facility] [place], [address]  Phone: [phone]  FAX: [phone]  [name] - 77 y.o. female  MRN [id number]  Date of Birth: Jan 04, 1946  Date: 09/26/2022  PCP: Owens Loffler, MD  Referral: Owens Loffler, MD  Chief Complaint  Patient presents with   Annual Exam    Part 2   Knee Pain    Right   Pain Under Left Rib   Patient Care Team: Owens Loffler, MD as PCP - General (Family Medicine) End, Harrell Gave, MD as PCP - Cardiology (Cardiology) Subjective:   BRITANY CALLICOTT is a 77 y.o. pleasant patient who presents with the following:  Health Maintenance Summary Reviewed and updated, unless pt declines services.  Tobacco History Reviewed. Non-smoker Alcohol: No concerns, no excessive use Exercise Habits: Some activity, rec at least 30 mins 5 times a week STD concerns: none Drug Use: None Lumps or breast concerns: no  Some sciatica - all the way up and down her back.  Not exercising, not walking much.  Recently, it has been pretty bad.   Both knees are still hurting all of the time.  Walking up and down steps is the worse.  Especially going up.  Relying on Tylenol TID.  She has significant chronic pain to the knee, though she does not have end-stage knee arthritis.  She has discussed this with me over years, as well as one of the total joint surgeons at [place]. - knee injection  Having some pain in her left rib.  Will sometimes catch under her rib.  Leaning over - Had a colonoscopy that was normal.  While she is initially describing rib pain, she is not having any rib pain.  She has pain in the left upper quadrant of her abdomen.  This has been present now for 1 year.  She has had a colonoscopy that was reassuring, she has never had any imaging.  Has been having more acid reflux.  Uncomfortable some lying down.   Also has had a dry hacking cough at  night.  Some production of yellow phlegm.   Covid booster  She is a pleasant patient with nonischemic cardiomyopathy and A-fib.  She is on amiodarone as well as Coumadin chronically.  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  Thyroid: No symptoms. Labs reviewed. Denies cold / heat intolerance, dry skin, hair loss. No goiter.  Lab Results  Component Value Date   TSH 4.40 09/20/2022    HTN: Tolerating all medications without side effects Stable and at goal No CP, no sob. No HA.  BP Readings from Last 3 Encounters:  09/26/22 104/60  07/29/22 120/66  06/20/22 (!) 170/86     Health Maintenance  Topic Date Due   COVID-19 Vaccine (5 - 2023-24 season) 04/29/2022   Medicare Annual Wellness (AWV)  08/16/2023   DTaP/Tdap/Td (2 - Tdap) 02/26/2029   Pneumonia Vaccine 66+ Years old  Completed   INFLUENZA VACCINE  Completed   DEXA SCAN  Completed   Hepatitis C Screening  Completed   Zoster Vaccines- Shingrix  Completed   HPV VACCINES  Aged Out   COLONOSCOPY (Pts 45-34yr Insurance coverage will need to be confirmed)  Discontinued    Immunization History  Administered Date(s) Administered   Fluad Quad(high Dose 65+) 07/29/2020, 05/26/2022   Influenza Split 05/11/2011   Influenza Whole 06/30/2010   Influenza, High Dose Seasonal PF 06/27/2015, 05/01/2018, 04/28/2019, 06/10/2021  Influenza,inj,Quad PF,6+ Mos 07/29/2013, 05/27/2016, 05/16/2017   PFIZER(Purple Top)SARS-COV-2 Vaccination 09/02/2019, 09/23/2019, 06/02/2020   Pfizer Covid-19 Vaccine Bivalent Booster 55yr & up 06/10/2021   Pneumococcal Conjugate-13 08/26/2015   Pneumococcal Polysaccharide-23 04/05/2010   Td 02/27/2019   Zoster Recombinat (Shingrix) 03/29/2018, 08/25/2018   Patient Active Problem List   Diagnosis Date Noted   Valvular heart disease 07/12/2018    Priority: High   PAF (paroxysmal atrial fibrillation) ([facility] 03/04/2015    Priority: High   On amiodarone therapy 11/14/2014    Priority: High   Mild  dementia ([facility] 10/24/2014    Priority: High   Chronic combined systolic and diastolic CHF, NYHA class 1 ([facility] 09/29/2014    Priority: High   NICM (nonischemic cardiomyopathy) ([facility] 09/29/2014    Priority: High   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, status post MVR 07/27/2009    Priority: High   Essential hypertension 07/12/2018    Priority: Medium    Current use of long term anticoagulation 11/14/2014    Priority: Medium    Hyperlipidemia LDL goal <100 04/05/2010    Priority: Medium    Hypothyroidism, unspecified 11/18/2008    Priority: Medium    Generalized anxiety disorder 11/18/2008    Priority: Medium    Major depressive disorder, recurrent episode, moderate with anxious distress ([facility] 11/18/2008    Priority: Medium    HFrEF (heart failure with reduced ejection fraction) ([place] 07/30/2022   History of colonic polyps    Polyp of sigmoid colon    Fracture of left pelvis ([facility] 04/29/2015   GERD 04/05/2010   Mixed incontinence urge and stress 11/18/2008   NEPHROLITHIASIS, HX OF 11/18/2008    Past Medical History:  Diagnosis Date   Anxiety    Atrial fibrillation and flutter ([facility] 09/29/2014   Revereted from Afib RVR to 2:1 A Flutter -- TEE/ DCCV 2/9; on Amiodarone   Chronic combined systolic and diastolic CHF, NYHA class 1 ([facility] -- Systolic dysfunction [id number][Q11.94]09/29/2014   Exacerbation 09/2014 2/2 Afib/flutter with RVR - 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[facility] 09/29/2014   a) [facility]: EF ~10% Severe LV dilation & global LV Systolic dysfxn, restrictive filling pattern - Gr 3 DD, mod RV dysfxn, severe LA dilation - 6.4 cm, mod RA dil, mod pl effusion, mod MR, mild TR; b) TEE 10/07/14:  EF ~10%, Severe LV dilation & dysfxn Mod RV dysfxn, LAA oversewn, Mod RA & TR   Diverticulosis    H/O: rheumatic fever    HYPERLIPIDEMIA 04/05/2010   Qualifier: Diagnosis of  By: CLorelei PontMD, Lakeshia Dohner     Hypothyroid    Migraine headache    Osteopenia    Paroxysmal atrial fibrillation (HR[place] 2005; Recurrent 09/2014   a) 2005: s/p Cox Maze & LAA ligation; b) 09/2014: TEE-cardioversion   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  Posterior MV leaflet is fixed and immobile.  MAC.  reduced excursion of the anterior MV leaflet.  Mean MV gradient was 357mHg and MVA calculated at cm2.  Urinary incontinence     Past Surgical History:  Procedure Laterality Date   ABDOMINAL HYSTERECTOMY     CARDIAC CATHETERIZATION  Jan 2005   Pre-op R&LHC -- Nonobstructive CAD; normal PA pressures   CARDIOVERSION  06/04/2012   Procedure: CARDIOVERSION;  Surgeon: Carlena Bjornstad, MD;  Location: [place];  Service: Cardiovascular;  Laterality: N/A;   CARDIOVERSION N/A 10/07/2014   Procedure: CARDIOVERSION;  Surgeon: Sueanne Margarita, MD;  Location: [place];  Service: Cardiovascular;  Laterality: N/A;   COLONOSCOPY WITH PROPOFOL N/A 07/11/2017   Procedure: COLONOSCOPY WITH PROPOFOL;  Surgeon: [place], Benay Pike, MD;  Location: [facility] ENDOSCOPY;  Service: Gastroenterology;  Laterality: N/A;   COLONOSCOPY WITH PROPOFOL N/A 05/17/2022   Procedure: COLONOSCOPY WITH PROPOFOL;  Surgeon: Lucilla Lame, MD;  Location: [facility] ENDOSCOPY;  Service: Endoscopy;  Laterality: N/A;   LUMBAR DISC SURGERY     x 2 distantly   MITRAL VALVE REPAIR  2005   with [facility] Maze for [employer]   NM MYOVIEW LTD  4/7/'16   EF 37% with septal hypokinesis and diffuse hypokinesis. No ischemia or infarction. Read as "intermediate risk "secondary to decreased EF  consistent with nonischemic cardiac myopathy.   RIGHT HEART CATHETERIZATION N/A 10/03/2014   Procedure: RIGHT HEART CATH;  Surgeon: Jolaine Artist, MD;  Location: [facility] CATH LAB;  Service: Cardiovascular;  RAP 74mHg, RVP 35/2/9 mmHg, PAP 45/12 mmHg, PCWP 16 mmHg; CO/I by Fick: 3.9/2.3; Ao/PA/SVC SaO2%: 97%/63%/65%.   TEE WITHOUT CARDIOVERSION N/A 10/07/2014   Procedure: TRANSESOPHAGEAL ECHOCARDIOGRAM (TEE);  Surgeon: TSueanne Margarita MD;  [place]  Service: Cardiovascular;  Laterality: N/A;   THYROIDECTOMY, PARTIAL     partial-no cancer; now on thyroid replacement   TRANSTHORACIC ECHOCARDIOGRAM  2013; 2/'16 & 5/'16   a) 2013: EF 60-65%, mild MR, Gr 2 DD; b) [place] ~10% - severel global LV dysfunction (systolic & diastolic) - dilated LV & restrictive filling pattern - Gr 3 DD, mod reduced RV function, severely dilated LA - 6.4 cm, mod dilated RA, mod pl effusion, mod MR, mild TR; c) 5/10/'16:  EF 25-30%, mod LV dilation, no RWMA,Gr 3 DD w/ high LAP, MV sewing ring intact w/ Mod MR, Severe LA dilation   TRANSTHORACIC ECHOCARDIOGRAM  January 2017:   EF improved to 45-50%. Mild diffuse HK. Mild MR. Moderate LA dilation. Normal PA pressures.    Family History  Problem Relation Age of Onset   Diabetes Mother    Coronary artery disease Mother    Kidney disease Mother    Breast cancer Neg Hx     Social History   Social History Narrative   Not on file    Past Medical History, Surgical History, Social History, Family History, Problem List, Medications, and Allergies have been reviewed and updated if relevant.  Review of Systems: Pertinent positives are listed above.  Otherwise, a full 14 point review of systems has been done in full and it is negative except where it is noted positive.  Objective:   BP 104/60   Pulse (!) 52   Temp (!) 97.2 F (36.2 C) (Temporal)   Ht 5' 7.5" (1.715 m)   Wt 180 lb (81.6 kg)   SpO2 98%   BMI 27.78 kg/m  Ideal Body Weight: Weight in  (lb) to have BMI = 25: 161.7 No results found.    08/15/2022   10:40 AM 08/11/2021    2:08 PM 08/14/2020    8:24 AM 05/27/2019    9:16  AM 05/18/2018    8:38 AM  Depression screen PHQ 2/9  Decreased Interest 0 0 0 0 0  Down, Depressed, Hopeless 0 0 0 0 0  PHQ - 2 Score 0 0 0 0 0  Altered sleeping   0 0 0  Tired, decreased energy   0 0 0  Change in appetite   0 0 0  Feeling bad or failure about yourself    0 0 0  Trouble concentrating   0 0 0  Moving slowly or fidgety/restless   0 0 0  Suicidal thoughts   0 0 0  PHQ-9 Score   0 0 0  Difficult doing work/chores   Not difficult at all Not difficult at all Not difficult at all     GEN: well developed, well nourished, no acute distress Eyes: conjunctiva and lids normal, PERRLA, EOMI ENT: TM clear, nares clear, oral exam WNL Neck: supple, no lymphadenopathy, no thyromegaly, no JVD Pulm: clear to auscultation and percussion, respiratory effort normal CV: regular rate and rhythm, S1-S2, no murmur, rub or gallop, no bruits Chest: no scars, masses, no lumps BREAST: breast exam declined  GI: soft, LUQ tenderness is present; no hepatosplenomegaly, masses; active bowel sounds all quadrants  GU: GU exam declined Lymph: no cervical, axillary or inguinal adenopathy MSK: gait normal, muscle tone and strength WNL, no joint swelling, effusions, discoloration, crepitus  SKIN: clear, good turgor, color WNL, no rashes, lesions, or ulcerations Neuro: normal mental status, normal strength, sensation, and motion Psych: alert; oriented to person, place and time, normally interactive and not anxious or depressed in appearance.   All labs reviewed with patient. Lab Review:     Latest Ref Rng & Units 09/20/2022    8:40 AM 07/29/2022   10:09 AM 07/16/2021   10:16 AM  CBC EXTENDED  WBC 4.0 - 10.5 K/uL 4.8  6.3  4.5   RBC 3.87 - 5.11 Mil/uL 4.00  3.98  3.83   Hemoglobin 12.0 - 15.0 g/dL 13.1  12.8  12.7   HCT 36.0 - 46.0 % 39.4  39.4  38.7    Platelets 150.0 - 400.0 K/uL 232.0  221  185   NEUT# 1.4 - 7.7 K/uL 2.8     Lymph# 0.7 - 4.0 K/uL 1.5          Latest Ref Rng & Units 09/20/2022    8:40 AM 07/29/2022   10:09 AM 11/29/2021    4:28 PM  BMP  Glucose 70 - 99 mg/dL 89  89  89   BUN 6 - 23 mg/dL 14  14  17    Creatinine 0.40 - 1.20 mg/dL 0.84  0.83  1.28   Sodium 135 - 145 mEq/L 139  141  141   Potassium 3.5 - 5.1 mEq/L 5.0  4.6  4.4   Chloride 96 - 112 mEq/L 104  110  106   CO2 19 - 32 mEq/L 28  23  28    Calcium 8.4 - 10.5 mg/dL 9.1  9.3  9.1        Latest Ref Rng & Units 09/20/2022    8:40 AM 07/29/2022   10:09 AM 11/29/2021    4:28 PM  Hepatic Function  Total Protein 6.0 - 8.3 g/dL 6.4  6.9  6.5   Albumin 3.5 - 5.2 g/dL 4.2  3.9  4.3   AST 0 - 37 U/L 16  20  19    ALT 0 - 35 U/L 15  17  16  Alk Phosphatase 39 - 117 U/L 65  80  70   Total Bilirubin 0.2 - 1.2 mg/dL 0.8  0.9  0.6   Bilirubin, Direct 0.0 - 0.3 mg/dL 0.1   0.1     Lab Results  Component Value Date   CHOL 213 (H) 09/20/2022   Lab Results  Component Value Date   HDL 58.30 09/20/2022   Lab Results  Component Value Date   LDLCALC 122 (H) 09/20/2022   Lab Results  Component Value Date   TRIG 165.0 (H) 09/20/2022   Lab Results  Component Value Date   CHOLHDL 4 09/20/2022   No results for input(s): "PSA" in the last 72 hours. Lab Results  Component Value Date   HCVAB NEGATIVE 03/04/2016   Lab Results  Component Value Date   VD25OH 50.49 09/20/2022   VD25OH 72.20 08/11/2021   VD25OH 57.71 08/12/2020     Lab Results  Component Value Date   HGBA1C 5.8 08/12/2020   HGBA1C 5.6 03/13/2019   Lab Results  Component Value Date   LDLCALC 122 (H) 09/20/2022   CREATININE 0.84 09/20/2022    Lab Results  Component Value Date   TSH 4.40 09/20/2022   T4TOTAL 7.0 03/31/2010   Free T4 and T3 are normal  Assessment and Plan:     ICD-10-CM   1. Encounter for health maintenance examination with abnormal findings  Z00.01     2. Left  upper quadrant pain  R10.12 CT Abdomen Pelvis W Contrast    3. Primary osteoarthritis of right knee  M17.11 triamcinolone acetonide (KENALOG-40) injection 40 mg    4. Hyperlipidemia LDL goal <100  E78.5      Overall health maintenance, doing fairly well.  Labs are all reassuring with the exception of cholesterol.  She is up-to-date on all screening testing.  Hyperlipidemia, elevated cholesterol and LDL today.  She does have a history of congestive heart failure, and I recommended going on Crestor, and she agrees.  Left upper quadrant pain: Left upper quadrant pain off and on for 1 year.  Subjective abdominal fullness.  She has already had some workup including GI consultation and colonoscopy, and these were reassuring.  Given ongoing chronic problems with waxing and waning abdominal pain, obtain a CT of the abdomen with pelvis with contrast to evaluate for occult pathology including neoplasm.  Acute on chronic right knee osteoarthritis with exacerbation.  Currently flared up.  Aspiration/Injection Procedure Note NICKAYLA MCINNIS Nov 25, 1945 Date of procedure: 09/26/2022  Procedure: Large Joint Aspiration / Injection of Knee, R Indications: Pain  Procedure Details Patient verbally consented to procedure. Risks, benefits, and alternatives explained. Sterilely prepped with Chloraprep. Ethyl cholride used for anesthesia. 9 cc Lidocaine 1% mixed with 1 mL of Kenalog 40 mg injected using the anteromedial approach without difficulty. No complications with procedure and tolerated well. Patient had decreased pain post-injection. Medication: 1 mL of Kenalog 40 mg   Health Maintenance Exam: The patient's preventative maintenance and recommended screening tests for an annual wellness exam were reviewed in full today. Brought up to date unless services declined.  Counselled on the importance of diet, exercise, and its role in overall health and mortality. The patient's FH and SH was reviewed, including  their home life, tobacco status, and drug and alcohol status.  Follow-up in 1 year for physical exam or additional follow-up below.  Disposition: Return in about 6 months (around 03/27/2023) for follow-up with Dr. Lorelei Pont.  Future Appointments  Date Time Provider [place]  10/19/2022 10:15 AM [facility] None  12/01/2022  9:40 AM End, Harrell Gave, MD [facility] None    Meds ordered this encounter  Medications   rosuvastatin (CRESTOR) 10 MG tablet    Sig: Take 1 tablet (10 mg total) by mouth daily.    Dispense:  90 tablet    Refill:  3   fluticasone (FLONASE) 50 MCG/ACT nasal spray    Sig: PLACE TWO SPRAYS INTO BOTH NOSTRILS DAILY    Dispense:  48 g    Refill:  3   triamcinolone acetonide (KENALOG-40) injection 40 mg   Medications Discontinued During This Encounter  Medication Reason   amiodarone (PACERONE) 100 MG tablet Change in therapy   carvedilol (COREG) 3.125 MG tablet    b complex vitamins tablet    HYDROcodone-acetaminophen (NORCO/VICODIN) 5-325 MG tablet    fluticasone (FLONASE) 50 MCG/ACT nasal spray Reorder   Orders Placed This Encounter  Procedures   CT Abdomen Pelvis W Contrast    Signed,  Hodge Stachnik T. Cotina Freedman, MD   Allergies as of 09/26/2022   No Known Allergies      Medication List        Accurate as of September 26, 2022  1:59 PM. If you have any questions, ask your nurse or doctor.          STOP taking these medications    b complex vitamins tablet Stopped by: Owens Loffler, MD   carvedilol 3.125 MG tablet Commonly known as: COREG Stopped by: Owens Loffler, MD   HYDROcodone-acetaminophen 5-325 MG tablet Commonly known as: NORCO/VICODIN Stopped by: Owens Loffler, MD       TAKE these medications    acetaminophen 500 MG tablet Commonly known as: TYLENOL Take 1,000 mg by mouth every 8 (eight) hours as needed.   amiodarone 200 MG tablet Commonly known as: PACERONE Take 100 mg by mouth daily. What  changed: Another medication with the same name was removed. Continue taking this medication, and follow the directions you see here. Changed by: Owens Loffler, MD   BIOTIN PO Take 1 tablet by mouth daily.   CALCIUM + D PO Take 1 capsule by mouth daily.   CALCIUM 600 PO Take 1 tablet by mouth daily.   cetirizine 10 MG tablet Commonly known as: ZYRTEC Take 10 mg by mouth daily.   cyanocobalamin 1000 MCG tablet Commonly known as: VITAMIN B12 Take 1,000 mcg by mouth daily.   donepezil 5 MG tablet Commonly known as: ARICEPT TAKE 1 TABLET BY MOUTH DAILY AT BEDTIME   FISH OIL PO Take 1 capsule by mouth daily.   fluticasone 50 MCG/ACT nasal spray Commonly known as: FLONASE PLACE TWO SPRAYS INTO BOTH NOSTRILS DAILY   IRON (FERROUS SULFATE) PO Take 1 tablet by mouth daily.   levothyroxine 75 MCG tablet Commonly known as: SYNTHROID TAKE 1 TAB BY MOUTH ONCE DAILY. TAKE ON AN EMPTY STOMACH WITH A GLASS OF WATER ATLEAST 30-60 MINUTES BEFORE BREAKFAST   losartan 25 MG tablet Commonly known as: COZAAR TAKE 1 TABLET BY MOUTH ONCE DAILY   Melatonin 5 MG Caps Take 1 capsule by mouth at bedtime.   metroNIDAZOLE 0.75 % cream Commonly known as: METROCREAM Apply topically 2 (two) times daily.   permethrin 5 % cream Commonly known as: ELIMITE APPLY PEA SIZED AMOUNT TO FACE ONCE A DAY   rosuvastatin 10 MG tablet Commonly known as: Crestor Take 1 tablet (10 mg total) by mouth daily. Started by: Owens Loffler, MD   tolterodine 4 MG 24  hr capsule Commonly known as: Detrol LA Take 1 capsule (4 mg total) by mouth daily.   traZODone 50 MG tablet Commonly known as: DESYREL TAKE 1/2 TO 1 TABLET BY MOUTH AT BEDTIMEAS NEEDED FOR SLEEP.   Vitamin D-3 125 MCG (5000 UT) Tabs Take 1 tablet by mouth daily.   warfarin 5 MG tablet Commonly known as: COUMADIN Take as directed by the anticoagulation clinic. If you are unsure how to take this medication, talk to your nurse or  doctor. Original instructions: TAKE 1/2 TO 1 TABLET BY MOUTH DAILY AS DIRECTED BY COUMADIN CLINIC

## 2022-09-27 ENCOUNTER — Telehealth: Payer: Self-pay | Admitting: Family Medicine

## 2022-09-27 MED ORDER — AMOXICILLIN 875 MG PO TABS: 875.0000 mg | ORAL_TABLET | Freq: Two times a day (BID) | ORAL | 0 refills | Status: DC

## 2022-09-27 NOTE — Telephone Encounter (Signed)
Mrs. Mccaul notified as instructed by telephone.

## 2022-09-27 NOTE — Telephone Encounter (Signed)
Patient called and stated over night the scratchy throat that she told Dr. Edilia Bo at her appointment yesterday turned into a sore throat. Call back number [phone].

## 2022-09-27 NOTE — Telephone Encounter (Signed)
Can you call back  I am sorry to hear that she is worsening.  I sent her in a round of amoxicillin to [facility].

## 2022-10-03 ENCOUNTER — Telehealth: Payer: Self-pay | Admitting: Family Medicine

## 2022-10-03 MED ORDER — CEFDINIR 300 MG PO CAPS: 600.0000 mg | ORAL_CAPSULE | Freq: Every day | ORAL | 0 refills | Status: DC

## 2022-10-03 MED ORDER — GUAIFENESIN-CODEINE 100-10 MG/5ML PO SYRP: 5.0000 mL | ORAL_SOLUTION | Freq: Three times a day (TID) | ORAL | 0 refills | Status: DC | PRN

## 2022-10-03 NOTE — Telephone Encounter (Signed)
Please call back  OK.  She can stop the amoxicillin, and I sent her in a 10 day course of some Omnicef/cefdinir.  Does not interact with Coumadin at all.

## 2022-10-03 NOTE — Telephone Encounter (Signed)
Patient was seen on 09/27/23,she was prescribed medicationamoxicillin (AMOXIL) 875 MG tablet  for her cold ,she called today stating that she is no better,and would like to know if she may need another medication called in or if she needs to come in and be reevaluated? She is requesting a phone call

## 2022-10-03 NOTE — Telephone Encounter (Signed)
Mrs. Guinther notified as instructed by telephone.  She states she is taking NyQuil at night but it is not helping.   She states she is coughing up big chunks of green phelgm and needs something so she can get some rest.  Please advise.

## 2022-10-19 ENCOUNTER — Ambulatory Visit: Payer: Medicare HMO | Attending: Internal Medicine | Admitting: Pharmacist

## 2022-10-19 DIAGNOSIS — Z7901 Long term (current) use of anticoagulants: Secondary | ICD-10-CM | POA: Diagnosis not present

## 2022-10-19 DIAGNOSIS — I480 Paroxysmal atrial fibrillation: Secondary | ICD-10-CM | POA: Diagnosis not present

## 2022-10-19 LAB — POCT INR: INR: 2.3 (ref 2.0–3.0)

## 2022-10-19 NOTE — Patient Instructions (Signed)
Description   Continue dosage of warfarin 1 tablet daily EXCEPT 1/2 tablet on [place]. - recheck in 4 weeks.

## 2022-10-20 ENCOUNTER — Ambulatory Visit
Admission: RE | Admit: 2022-10-20 | Discharge: 2022-10-20 | Disposition: A | Discharge status: Home / Self Care | Payer: Medicare HMO | Source: Ambulatory Visit | Attending: Family Medicine | Admitting: Family Medicine

## 2022-10-20 DIAGNOSIS — R1012 Left upper quadrant pain: Secondary | ICD-10-CM | POA: Insufficient documentation

## 2022-10-20 MED ORDER — IOHEXOL 300 MG/ML  SOLN
100.0000 mL | Freq: Once | INTRAMUSCULAR | Status: AC | PRN
  Administered 2022-10-20: 100 mL via INTRAVENOUS

## 2022-10-24 ENCOUNTER — Other Ambulatory Visit: Payer: Self-pay | Admitting: Family Medicine

## 2022-10-24 ENCOUNTER — Telehealth: Payer: Self-pay | Admitting: Family Medicine

## 2022-10-24 MED ORDER — PANTOPRAZOLE SODIUM 40 MG PO TBEC: 40.0000 mg | DELAYED_RELEASE_TABLET | Freq: Every day | ORAL | 3 refills | Status: DC

## 2022-10-24 NOTE — Telephone Encounter (Signed)
Pt called asking for results from CT scan? Call back # [phone]

## 2022-10-24 NOTE — Telephone Encounter (Signed)
See result notes. 

## 2022-11-16 ENCOUNTER — Ambulatory Visit: Payer: Medicare HMO | Attending: Internal Medicine | Admitting: Pharmacist Clinician (PhC)/ Clinical Pharmacy Specialist

## 2022-11-16 DIAGNOSIS — I480 Paroxysmal atrial fibrillation: Secondary | ICD-10-CM | POA: Diagnosis not present

## 2022-11-16 DIAGNOSIS — Z7901 Long term (current) use of anticoagulants: Secondary | ICD-10-CM | POA: Diagnosis not present

## 2022-11-16 LAB — POCT INR: INR: 1.4 — AB (ref 2.0–3.0)

## 2022-11-16 NOTE — Patient Instructions (Signed)
Take 2 tablets today Wednesday March 20, then increase dose to 1 tablet daily except 1/2 tablet each Monday.  Repeat INR in 2 weeks

## 2022-11-30 ENCOUNTER — Ambulatory Visit: Payer: Medicare HMO | Attending: Internal Medicine

## 2022-11-30 DIAGNOSIS — I480 Paroxysmal atrial fibrillation: Secondary | ICD-10-CM | POA: Diagnosis not present

## 2022-11-30 DIAGNOSIS — Z5181 Encounter for therapeutic drug level monitoring: Secondary | ICD-10-CM

## 2022-11-30 LAB — POCT INR: INR: 2.4 (ref 2.0–3.0)

## 2022-11-30 NOTE — Patient Instructions (Signed)
Description   Continue on same dosage of Warfarin 1 tablet daily except 1/2 tablet on Mondays.  Repeat INR in 4 weeks

## 2022-12-01 ENCOUNTER — Ambulatory Visit: Payer: Medicare HMO | Admitting: Internal Medicine

## 2022-12-01 ENCOUNTER — Other Ambulatory Visit: Payer: Self-pay | Admitting: Family Medicine

## 2022-12-01 ENCOUNTER — Other Ambulatory Visit
Admission: RE | Admit: 2022-12-01 | Discharge: 2022-12-01 | Disposition: A | Discharge status: Home / Self Care | Payer: Medicare HMO | Source: Ambulatory Visit | Attending: Internal Medicine | Admitting: Internal Medicine

## 2022-12-01 ENCOUNTER — Encounter: Payer: Self-pay | Admitting: Internal Medicine

## 2022-12-01 VITALS — BP 126/78 | HR 49 | Ht 67.5 in | Wt 180.0 lb

## 2022-12-01 DIAGNOSIS — Z01812 Encounter for preprocedural laboratory examination: Secondary | ICD-10-CM

## 2022-12-01 DIAGNOSIS — I38 Endocarditis, valve unspecified: Secondary | ICD-10-CM | POA: Diagnosis not present

## 2022-12-01 DIAGNOSIS — Z9889 Other specified postprocedural states: Secondary | ICD-10-CM

## 2022-12-01 DIAGNOSIS — I209 Angina pectoris, unspecified: Secondary | ICD-10-CM | POA: Diagnosis not present

## 2022-12-01 DIAGNOSIS — I480 Paroxysmal atrial fibrillation: Secondary | ICD-10-CM | POA: Diagnosis not present

## 2022-12-01 DIAGNOSIS — R072 Precordial pain: Secondary | ICD-10-CM

## 2022-12-01 DIAGNOSIS — Z1231 Encounter for screening mammogram for malignant neoplasm of breast: Secondary | ICD-10-CM

## 2022-12-01 DIAGNOSIS — I700 Atherosclerosis of aorta: Secondary | ICD-10-CM

## 2022-12-01 LAB — BASIC METABOLIC PANEL
Anion gap: 8 (ref 5–15)
BUN: 15 mg/dL (ref 8–23)
CO2: 24 mmol/L (ref 22–32)
Calcium: 9.1 mg/dL (ref 8.9–10.3)
Chloride: 108 mmol/L (ref 98–111)
Creatinine, Ser: 0.83 mg/dL (ref 0.44–1.00)
GFR, Estimated: >60 mL/min (ref >60)
Glucose, Bld: 113 mg/dL — ABNORMAL HIGH (ref 70–99)
Potassium: 4.5 mmol/L (ref 3.5–5.1)
Sodium: 140 mmol/L (ref 135–145)

## 2022-12-01 NOTE — Progress Notes (Signed)
Follow-up Outpatient Visit Date: 12/01/2022  Primary Care Provider: [name], MD [address] [place] [address]  Chief Complaint: Tired feeling  HPI:  [name] is a 77 y.o. 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  [name] main complaints surround her chronic lower extremity joint and back pain.  However, over the last few months she has experienced a "tired feeling" with modest activity.  She describes this as an uncomfortable feeling in her chest accompanied by shortness of breath.  These symptoms resolve in a few seconds when she sits down to rest.  She has not had any chest pain or dyspnea at rest.  She denies palpitations and lightheadedness.  She has intermittent joint swelling but denies soft tissue edema in her legs.  [name] notes that aortic atherosclerosis was recently noted on an abdominal CT, which prompted [name] to start rosuvastatin.  She just started this about a month ago but seems to be tolerating it well.  She had a protracted respiratory illness in February that finally resolved after 2 courses of antibiotics.  --------------------------------------------------------------------------------------------------  Past Medical History:  Diagnosis Date   Anxiety    Atrial fibrillation and flutter 09/29/2014   Revereted from Afib RVR to 2:1 A Flutter -- TEE/ DCCV 2/9; on Amiodarone   Chronic combined systolic and diastolic CHF, NYHA class 1 (HCC) -- Systolic dysfunction resolved[I50.42] 09/29/2014   Exacerbation 09/2014 2/2 Afib/flutter with RVR - 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 09/29/2014   a) [facility] Echo: EF ~10% Severe LV dilation & global LV Systolic dysfxn, restrictive filling pattern - Gr 3 DD, mod RV dysfxn, severe LA dilation - 6.4 cm, mod RA dil, mod pl effusion, mod MR, mild TR; b) TEE 10/07/14: EF ~10%, Severe LV dilation & dysfxn Mod RV dysfxn, LAA oversewn, Mod RA & TR   Diverticulosis    H/O: rheumatic fever    HYPERLIPIDEMIA 04/05/2010   Qualifier: Diagnosis of  By: Patsy Lager MD, Karleen Hampshire     Hypothyroid    Migraine headache    Osteopenia    Paroxysmal atrial fibrillation 2005; Recurrent 09/2014   a) 2005: s/p Cox Maze & LAA ligation; b) 09/2014: TEE-cardioversion   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  Posterior MV leaflet is fixed and immobile.  MAC.  reduced excursion of the anterior MV leaflet.  Mean MV gradient was 3mm Hg and MVA calculated at cm2.      Urinary incontinence    Past Surgical History:  Procedure Laterality Date   ABDOMINAL HYSTERECTOMY     CARDIAC CATHETERIZATION  Jan 2005   Pre-op R&LHC -- Nonobstructive CAD; normal PA pressures   CARDIOVERSION  06/04/2012   Procedure: CARDIOVERSION;  Surgeon: Luis Abed, MD;  Location: [facility] ENDOSCOPY;  Service: Cardiovascular;  Laterality: N/A;   CARDIOVERSION N/A 10/07/2014  Procedure: CARDIOVERSION;  Surgeon: Quintella Reichert, MD;  Location: [facility] ENDOSCOPY;  Service: Cardiovascular;  Laterality: N/A;   COLONOSCOPY WITH PROPOFOL N/A 07/11/2017   Procedure: COLONOSCOPY WITH PROPOFOL;  Surgeon: Toledo, Boykin Nearing, MD;  Location: [facility] ENDOSCOPY;  Service: Gastroenterology;  Laterality: N/A;   COLONOSCOPY WITH PROPOFOL N/A 05/17/2022   Procedure:  COLONOSCOPY WITH PROPOFOL;  Surgeon: Midge Minium, MD;  Location: [facility] ENDOSCOPY;  Service: Endoscopy;  Laterality: N/A;   LUMBAR DISC SURGERY     x 2 distantly   MITRAL VALVE REPAIR  2005   with Cox Maze for [employer]   NM MYOVIEW LTD  4/7/'16   EF 37% with septal hypokinesis and diffuse hypokinesis. No ischemia or infarction. 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   RIGHT HEART CATHETERIZATION N/A 10/03/2014   Procedure: RIGHT HEART CATH;  Surgeon: Dolores Patty, MD;  Location: [facility] CATH LAB;  Service: Cardiovascular;  RAP , RVP 35/2/9 mmHg, PAP 45/12 mmHg, PCWP 16 mmHg; CO/I by Fick: 3.9/2.3; Ao/PA/SVC SaO2%: 97%/63%/65%.   TEE WITHOUT CARDIOVERSION N/A 10/07/2014   Procedure: TRANSESOPHAGEAL ECHOCARDIOGRAM (TEE);  Surgeon: Quintella Reichert, MD;  Location: [facility] ENDOSCOPY;  Service: Cardiovascular;  Laterality: N/A;   THYROIDECTOMY, PARTIAL     partial-no cancer; now on thyroid replacement   TRANSTHORACIC ECHOCARDIOGRAM  2013; 2/'16 & 5/'16   a) 2013: EF 60-65%, mild MR, Gr 2 DD; b) 2/'16 @ [facility]: EF ~10% - severel global LV dysfunction (systolic & diastolic) - dilated LV & restrictive filling pattern - Gr 3 DD, mod reduced RV function, severely dilated LA - 6.4 cm, mod dilated RA, mod pl effusion, mod MR, mild TR; c) 5/10/'16:  EF 25-30%, mod LV dilation, no RWMA,Gr 3 DD w/ high LAP, MV sewing ring intact w/ Mod MR, Severe LA dilation   TRANSTHORACIC ECHOCARDIOGRAM  January 2017:   EF improved to 45-50%. Mild diffuse HK. Mild MR. Moderate LA dilation. Normal PA pressures.    Current Meds  Medication Sig   acetaminophen (TYLENOL) 500 MG tablet Take 1,000 mg by mouth every 8 (eight) hours as needed.   amiodarone (PACERONE) 200 MG tablet Take 100 mg by mouth daily.   BIOTIN PO Take 1 tablet by mouth daily.   Calcium Carbonate (CALCIUM 600 PO) Take 1 tablet by mouth daily.   Calcium Citrate-Vitamin D (CALCIUM + D PO) Take 1 capsule by  mouth daily.   cetirizine (ZYRTEC) 10 MG tablet Take 10 mg by mouth daily.   Cholecalciferol (VITAMIN D-3) 125 MCG (5000 UT) TABS Take 1 tablet by mouth daily.   cyanocobalamin (VITAMIN B12) 1000 MCG tablet Take 1,000 mcg by mouth daily.   donepezil (ARICEPT) 5 MG tablet TAKE 1 TABLET BY MOUTH DAILY AT BEDTIME   fluticasone (FLONASE) 50 MCG/ACT nasal spray PLACE TWO SPRAYS INTO BOTH NOSTRILS DAILY   IRON, FERROUS SULFATE, PO Take 1 tablet by mouth daily.   levothyroxine (SYNTHROID) 75 MCG tablet TAKE 1 TAB BY MOUTH ONCE DAILY. TAKE ON AN EMPTY STOMACH WITH A GLASS OF WATER ATLEAST 30-60 MINUTES BEFORE BREAKFAST   losartan (COZAAR) 25 MG tablet TAKE 1 TABLET BY MOUTH ONCE DAILY   Melatonin 5 MG CAPS Take 1 capsule by mouth at bedtime.   metroNIDAZOLE (METROCREAM) 0.75 % cream Apply topically 2 (two) times daily.   Omega-3 Fatty Acids (FISH OIL PO) Take 1 capsule by mouth daily.   pantoprazole (PROTONIX) 40 MG tablet Take 1 tablet (40 mg total) by mouth  daily.   permethrin (ELIMITE) 5 % cream APPLY PEA SIZED AMOUNT TO FACE ONCE A DAY   rosuvastatin (CRESTOR) 10 MG tablet Take 1 tablet (10 mg total) by mouth daily.   traZODone (DESYREL) 50 MG tablet TAKE 1/2 TO 1 TABLET BY MOUTH AT BEDTIMEAS NEEDED FOR SLEEP.   warfarin (COUMADIN) 5 MG tablet TAKE 1/2 TO 1 TABLET BY MOUTH DAILY AS DIRECTED BY COUMADIN CLINIC    Allergies: Patient has no known allergies.  Social History   Tobacco Use   Smoking status: Never   Smokeless tobacco: Never  Vaping Use   Vaping Use: Never used  Substance Use Topics   Alcohol use: Not Currently   Drug use: No    Family History  Problem Relation Age of Onset   Diabetes Mother    Coronary artery disease Mother    Kidney disease Mother    Breast cancer Neg Hx     Review of Systems: A 12-system review of systems was performed and was negative except as noted in the  HPI.  --------------------------------------------------------------------------------------------------  Physical Exam: BP 126/78 (BP Location: Left Arm, Patient Position: Sitting, Cuff Size: Normal)   Pulse (!) 49   Ht 5' 7.5" (1.715 m)   Wt 180 lb (81.6 kg)   SpO2 97%   BMI 27.78 kg/m   General:  NAD. Neck: No JVD or HJR. Lungs: Clear to auscultation bilaterally without wheezes or crackles. Heart: Bradycardic but regular without murmurs, rubs, or gallops. Abdomen: Soft, nontender, nondistended. Extremities: No lower extremity edema.  EKG:  Sinus bradycardia with 1st degree AV block with low voltage and anterolateral ST/T changes.  No significant change since 07/29/2022.  Lab Results  Component Value Date   WBC 4.8 09/20/2022   HGB 13.1 09/20/2022   HCT 39.4 09/20/2022   MCV 98.6 09/20/2022   PLT 232.0 09/20/2022    Lab Results  Component Value Date   NA 140 12/01/2022   K 4.5 12/01/2022   CL 108 12/01/2022   CO2 24 12/01/2022   BUN 15 12/01/2022   CREATININE 0.83 12/01/2022   GLUCOSE 113 (H) 12/01/2022   ALT 15 09/20/2022    Lab Results  Component Value Date   CHOL 213 (H) 09/20/2022   HDL 58.30 09/20/2022   LDLCALC 122 (H) 09/20/2022   LDLDIRECT 132.3 03/31/2010   TRIG 165.0 (H) 09/20/2022   CHOLHDL 4 09/20/2022   Lab Results  Component Value Date   TSH 4.40 09/20/2022   T4TOTAL 7.0 03/31/2010   --------------------------------------------------------------------------------------------------  ASSESSMENT AND PLAN: Angina pectoris: Chest pain and shortness of breath with modest activity over the last few months is concerning of angina.  She had a cath in 2005 prior to her valve repair that showed no significant CAD.  We have agreed to perform a coronary CTA and echocardiogram for further evaluation.  Defer medication changes at this time, including addition of aspirin, given chronic anticoagulation with warfarin.  Valvular heart disease: No murmur on  exam.  [name] appears euvolemic.  Proceed with echo, as above, for further evaluation of dyspnea and chest discomfort.  Paroxysmal atrial fibrillation: [name] is maintaining sinus rhythm.  Bradycardia is chronic.  Continue low-dose amiodarone and indefinite anticoagulation with warfarin.  Aortic atherosclerosis: Recent abdominal CT showed aortic atherosclerosis.  I agree with addition of rosuvastatin.  Lipid panel should be repeated in ~2 months to assess response.  Follow-up: Return to clinic in 6 months, sooner if echo/CTA show significant abnormalities or symptoms worsen.  Cristal Deer  Clever Geraldo, MD 12/02/2022 12:26 PM

## 2022-12-01 NOTE — Patient Instructions (Addendum)
Medication Instructions:  Your Physician recommend you continue on your current medication as directed.    *If you need a refill on your cardiac medications before your next appointment, please call your pharmacy*   Lab Work: Your provider would like for you to have following labs drawn: (BMP).   Please go to the [facility] entrance and check in at the front desk.  You do not need an appointment.  They are open from 7am-6 pm.     Testing/Procedures: Your physician has requested that you have an echocardiogram. Echocardiography is a painless test that uses sound waves to create images of your heart. It provides your doctor with information about the size and shape of your heart and how well your heart's chambers and valves are working.   You may receive an ultrasound enhancing agent through an IV if needed to better visualize your heart during the echo. This procedure takes approximately one hour.  There are no restrictions for this procedure.  This will take place at [place] ([place]) [phone], [place]  Cardiac CT Angiography (CTA), is a special type of CT scan that uses a computer to produce multi-dimensional views of major blood vessels throughout the body. In CT angiography, a contrast material is injected through an IV to help visualize the blood vessels  Please see instructions below  Follow-Up: At [facility], you and your health needs are our priority.  As part of our continuing mission to provide you with exceptional heart care, we have created designated Provider Care Teams.  These Care Teams include your primary Cardiologist (physician) and Advanced Practice Providers (APPs -  Physician Assistants and Nurse Practitioners) who all work together to provide you with the care you need, when you need it.  We recommend signing up for the patient portal called "MyChart".  Sign up information is provided on this After Visit Summary.   MyChart is used to connect with patients for Virtual Visits (Telemedicine).  Patients are able to view lab/test results, encounter notes, upcoming appointments, etc.  Non-urgent messages can be sent to your provider as well.   To learn more about what you can do with MyChart, go to [email or web address].    Your next appointment:   6 month(s)  Provider:   You may see Nelva Bush, MD or one of the following Advanced Practice Providers on your designated Care Team:   Murray Hodgkins, NP Christell Faith, PA-C Cadence Kathlen Mody, PA-C Gerrie Nordmann, NP      Your cardiac CT will be scheduled at one of the below locations:    [facility] [place], [place] [address] [phone]  [facility] [place], [place] [address] [phone]  If scheduled at [facility], please arrive at the [facility] and Children's Entrance (Entrance C2) of [facility] 30 minutes prior to test start time. You can use the FREE valet parking offered at entrance C (encouraged to control the heart rate for the test)  Proceed to the [facility] Radiology Department (first floor) to check-in and test prep.  All radiology patients and guests should use entrance C2 at [facility], accessed from [facility], even though the hospital's physical address listed is [address]    If scheduled at [facility] or [facility], please arrive 15 mins early for check-in and test prep.  Please follow these instructions carefully (unless otherwise directed):  On the Night Before the Test: Be sure to Drink plenty of water. Do not consume any caffeinated/decaffeinated beverages or chocolate 12 hours prior to your test.   On the Day of the Test: Drink plenty of water until 1 hour prior to the test. Do not eat any food 1 hour prior to  test. You may take your regular medications prior to the test.  FEMALES- please wear underwire-free bra if available, avoid dresses & tight clothing       After the Test: Drink plenty of water. After receiving IV contrast, you may experience a mild flushed feeling. This is normal. On occasion, you may experience a mild rash up to 24 hours after the test. This is not dangerous. If this occurs, you can take Benadryl 25 mg and increase your fluid intake. If you experience trouble breathing, this can be serious. If it is severe call 911 IMMEDIATELY. If it is mild, please call our office. If you take any of these medications: Glipizide/Metformin, Avandament, Glucavance, please do not take 48 hours after completing test unless otherwise instructed.  We will call to schedule your test 2-4 weeks out understanding that some insurance companies will need an authorization prior to the service being performed.   For non-scheduling related questions, please contact the cardiac imaging nurse navigator should you have any questions/concerns: Marchia Bond, Cardiac Imaging Nurse Navigator Gordy Clement, Cardiac Imaging Nurse Navigator [facility] Direct Office Dial: [phone]   For scheduling needs, including cancellations and rescheduling, please call [place], [phone].

## 2022-12-02 ENCOUNTER — Encounter: Payer: Self-pay | Admitting: Internal Medicine

## 2022-12-02 DIAGNOSIS — I700 Atherosclerosis of aorta: Secondary | ICD-10-CM | POA: Insufficient documentation

## 2022-12-02 DIAGNOSIS — I209 Angina pectoris, unspecified: Secondary | ICD-10-CM | POA: Insufficient documentation

## 2022-12-02 DIAGNOSIS — Z9889 Other specified postprocedural states: Secondary | ICD-10-CM | POA: Insufficient documentation

## 2022-12-05 ENCOUNTER — Other Ambulatory Visit: Payer: Self-pay | Admitting: Family Medicine

## 2022-12-21 ENCOUNTER — Ambulatory Visit
Admission: RE | Admit: 2022-12-21 | Discharge: 2022-12-21 | Disposition: A | Discharge status: Home / Self Care | Payer: Medicare HMO | Source: Ambulatory Visit | Attending: Family Medicine | Admitting: Family Medicine

## 2022-12-21 ENCOUNTER — Telehealth (HOSPITAL_COMMUNITY): Payer: Self-pay | Admitting: *Deleted

## 2022-12-21 DIAGNOSIS — Z1231 Encounter for screening mammogram for malignant neoplasm of breast: Secondary | ICD-10-CM | POA: Insufficient documentation

## 2022-12-21 NOTE — Telephone Encounter (Signed)
Reaching out to patient to offer assistance regarding upcoming cardiac imaging study; pt verbalizes understanding of appt date/time, parking situation and where to check in, pre-test NPO status, and verified current allergies; name and call back number provided for further questions should they arise  Larey Brick RN Navigator Cardiac Imaging [facility] [phone] office [phone] cell  Patient confirms taking amiodarone and low HR.

## 2022-12-22 ENCOUNTER — Ambulatory Visit
Admission: RE | Admit: 2022-12-22 | Discharge: 2022-12-22 | Disposition: A | Discharge status: Home / Self Care | Payer: Medicare HMO | Source: Ambulatory Visit | Attending: Internal Medicine | Admitting: Internal Medicine

## 2022-12-22 DIAGNOSIS — I209 Angina pectoris, unspecified: Secondary | ICD-10-CM | POA: Diagnosis not present

## 2022-12-22 MED ORDER — IOHEXOL 350 MG/ML SOLN
75.0000 mL | Freq: Once | INTRAVENOUS | Status: AC | PRN
  Administered 2022-12-22: 75 mL via INTRAVENOUS

## 2022-12-22 MED ORDER — NITROGLYCERIN 0.4 MG SL SUBL
0.8000 mg | SUBLINGUAL_TABLET | Freq: Once | SUBLINGUAL | Status: AC
  Administered 2022-12-22: 0.8 mg via SUBLINGUAL

## 2022-12-22 NOTE — Progress Notes (Signed)
Patient tolerated procedure well. Ambulate w/o difficulty. Denies any lightheadedness or being dizzy. Pt denies any pain at this time. Sitting in chair, pt is encouraged to drink additional water throughout the day and reason explained to patient. Patient verbalized understanding and all questions answered. ABC intact. No further needs at this time. Discharge from procedure area w/o issues.  

## 2022-12-26 ENCOUNTER — Telehealth: Payer: Self-pay | Admitting: Family Medicine

## 2022-12-26 ENCOUNTER — Other Ambulatory Visit: Payer: Self-pay | Admitting: Family Medicine

## 2022-12-26 MED ORDER — CARVEDILOL 3.125 MG PO TABS: 3.1250 mg | ORAL_TABLET | Freq: Two times a day (BID) | ORAL | 3 refills | Status: DC

## 2022-12-26 NOTE — Telephone Encounter (Signed)
Pt call stated she need RX Carvidilol refill . Medication is no longer on pt med list . Pt stated she all out in need it sent to  [facility]  Please advise [phone]

## 2022-12-26 NOTE — Telephone Encounter (Signed)
She is supposed to still be on Coreg.  I reviewed all notes including Cardiology, and it looks like it was unintentionally removed from her med list.

## 2022-12-26 NOTE — Telephone Encounter (Addendum)
Refills sent to [facility]. Left message for Mrs. Kutch that refills have been sent to her pharmacy.

## 2022-12-28 ENCOUNTER — Telehealth: Payer: Self-pay | Admitting: Family Medicine

## 2022-12-28 ENCOUNTER — Ambulatory Visit: Payer: Medicare HMO | Attending: Internal Medicine | Admitting: *Deleted

## 2022-12-28 ENCOUNTER — Encounter: Payer: Self-pay | Admitting: Family Medicine

## 2022-12-28 ENCOUNTER — Ambulatory Visit (INDEPENDENT_AMBULATORY_CARE_PROVIDER_SITE_OTHER): Payer: Medicare HMO | Admitting: Family Medicine

## 2022-12-28 VITALS — BP 120/68 | HR 55 | Temp 98.0°F | Ht 67.5 in | Wt 178.1 lb

## 2022-12-28 DIAGNOSIS — Z5181 Encounter for therapeutic drug level monitoring: Secondary | ICD-10-CM | POA: Diagnosis not present

## 2022-12-28 DIAGNOSIS — R062 Wheezing: Secondary | ICD-10-CM

## 2022-12-28 DIAGNOSIS — I480 Paroxysmal atrial fibrillation: Secondary | ICD-10-CM | POA: Diagnosis not present

## 2022-12-28 DIAGNOSIS — J208 Acute bronchitis due to other specified organisms: Secondary | ICD-10-CM | POA: Diagnosis not present

## 2022-12-28 LAB — POCT INR: INR: 3.3 — AB (ref 2.0–3.0)

## 2022-12-28 MED ORDER — CEFDINIR 300 MG PO CAPS
600.0000 mg | ORAL_CAPSULE | Freq: Every day | ORAL | 0 refills | Status: DC
Start: 2022-12-28 — End: 2023-03-27

## 2022-12-28 NOTE — Progress Notes (Signed)
[name] T. [name], MD, CAQ Sports Medicine [facility] at [facility] [address] [place], [address]  Phone: [phone]  FAX: [phone]  ALYZABETH [name] - 77 y.o. female  MRN [id number]  Date of Birth: 1946/06/16  Date: 12/28/2022  PCP: [name], MD  Referral: [name], MD  Chief Complaint  Patient presents with   Cough    Deep-Started early Monday morning   Subjective:   [name] is a 77 y.o. very pleasant female patient with Body mass index is 27.49 kg/m. who presents with the following:  Bad cough x 3 d.   Has been getting worse. Lower chest with a rattling cough.   Burns all the way up.   No fever, eating and drinking ok. Decreased appetite.  Ate a little bit, but not a lot.   Has had a headache No arthralgia.  Normal BM.   Took a couple of Tylenol.  Last night took some Nyquil.   Rhonchi B Wheezing  Review of Systems is noted in the HPI, as appropriate  Objective:   BP 120/68 (BP Location: Left Arm, Patient Position: Sitting, Cuff Size: Large)   Pulse (!) 55   Temp 98 F (36.7 C) (Temporal)   Ht 5' 7.5" (1.715 m)   Wt 178 lb 2 oz (80.8 kg)   SpO2 96%   BMI 27.49 kg/m    Gen: WDWN, NAD. Globally Non-toxic HEENT: Throat clear, w/o exudate, R TM clear, L TM - good landmarks, No fluid present. rhinnorhea.  MMM Frontal sinuses: NT Max sinuses: NT NECK: Anterior cervical  LAD is absent CV: RRR, No M/G/R, cap refill <2 sec PULM: Breathing comfortably in no respiratory distress. + diffuse rhonchi with wheezing B   Laboratory and Imaging Data:  Assessment and Plan:     ICD-10-CM   1. Acute bronchitis due to other specified organisms  J20.8     2. Wheezing  R06.2      Concern for lower resp infection with pronounced rhonchi and wheezing.  Will start with pna coverage.  If wheezing worsens, we could add steroids.   Medication Management during today's office visit: Meds ordered this encounter   Medications   cefdinir (OMNICEF) 300 MG capsule    Sig: Take 2 capsules (600 mg total) by mouth daily.    Dispense:  20 capsule    Refill:  0   There are no discontinued medications.  Orders placed today for conditions managed today: No orders of the defined types were placed in this encounter.   Disposition: No follow-ups on file.  Dr[employer] One speech-to-text software was used for transcription in this dictation.  Possible transcriptional errors can occur using Animal nutritionist.   Signed,  Elpidio Galea. [name], MD   Outpatient Encounter Medications as of 12/28/2022  Medication Sig   acetaminophen (TYLENOL) 500 MG tablet Take 1,000 mg by mouth every 8 (eight) hours as needed.   amiodarone (PACERONE) 200 MG tablet Take 100 mg by mouth daily.   BIOTIN PO Take 1 tablet by mouth daily.   Calcium Carbonate (CALCIUM 600 PO) Take 1 tablet by mouth daily.   Calcium Citrate-Vitamin D (CALCIUM + D PO) Take 1 capsule by mouth daily.   carvedilol (COREG) 3.125 MG tablet Take 1 tablet (3.125 mg total) by mouth 2 (two) times daily.   cefdinir (OMNICEF) 300 MG capsule Take 2 capsules (600 mg total) by mouth daily.   cetirizine (ZYRTEC) 10 MG tablet Take 10  mg by mouth daily.   Cholecalciferol (VITAMIN D-3) 125 MCG (5000 UT) TABS Take 1 tablet by mouth daily.   cyanocobalamin (VITAMIN B12) 1000 MCG tablet Take 1,000 mcg by mouth daily.   donepezil (ARICEPT) 5 MG tablet TAKE 1 TABLET BY MOUTH DAILY AT BEDTIME   fluticasone (FLONASE) 50 MCG/ACT nasal spray PLACE TWO SPRAYS INTO BOTH NOSTRILS DAILY   IRON, FERROUS SULFATE, PO Take 1 tablet by mouth daily.   levothyroxine (SYNTHROID) 75 MCG tablet TAKE ONE TAB BY MOUTH ONCE DAILY. TAKE ON AN EMPTY STOMACH WITH A GLASS OF WATER ATLEAST 30-60 MINUTES BEFORE BREAKFAST   losartan (COZAAR) 25 MG tablet TAKE 1 TABLET BY MOUTH ONCE DAILY   Melatonin 5 MG CAPS Take 1 capsule by mouth at bedtime.   metroNIDAZOLE (METROCREAM) 0.75 % cream Apply topically  2 (two) times daily.   Omega-3 Fatty Acids (FISH OIL PO) Take 1 capsule by mouth daily.   pantoprazole (PROTONIX) 40 MG tablet Take 1 tablet (40 mg total) by mouth daily.   permethrin (ELIMITE) 5 % cream APPLY PEA SIZED AMOUNT TO FACE ONCE A DAY   rosuvastatin (CRESTOR) 10 MG tablet Take 1 tablet (10 mg total) by mouth daily.   traZODone (DESYREL) 50 MG tablet TAKE 1/2 TO 1 TABLET BY MOUTH AT BEDTIMEAS NEEDED FOR SLEEP.   warfarin (COUMADIN) 5 MG tablet TAKE 1/2 TO 1 TABLET BY MOUTH DAILY AS DIRECTED BY COUMADIN CLINIC   No facility-administered encounter medications on file as of 12/28/2022.

## 2022-12-28 NOTE — Patient Instructions (Signed)
Hold warfarin tonight then resume 1 tablet daily except 1/2 tablet on Mondays.  Repeat INR in 4 weeks

## 2022-12-28 NOTE — Telephone Encounter (Signed)
Patient called in and stated that she is dealing with the same thing from January/February. She was wondering if Dr. Patsy Lager would send her the same medication in. Informed patient that she will posssibly have to come in the office but she stated that she would like [name] to give her call. Thank you!

## 2022-12-28 NOTE — Telephone Encounter (Signed)
Spoke to patient by telephone and was advised that she started Monday with a deep cough. Patient stated that she had something similar back the beginning of the year and she got really sick. Patient stated sometimes she feels like there is a rattle in her chest. Patient wanted to know if Dr. Patsy Lager would send her in an antibiotic.  Patient was advised that she really needs to be seen so that the doctor can listen to her lungs. Patient scheduled an appointment today 12/28/22 at 2:00 pm with Dr. Patsy Lager.

## 2023-01-03 ENCOUNTER — Ambulatory Visit: Payer: Medicare HMO | Attending: Internal Medicine

## 2023-01-03 DIAGNOSIS — I209 Angina pectoris, unspecified: Secondary | ICD-10-CM

## 2023-01-03 DIAGNOSIS — Z9889 Other specified postprocedural states: Secondary | ICD-10-CM | POA: Diagnosis not present

## 2023-01-03 LAB — ECHOCARDIOGRAM COMPLETE
AR max vel: 3.4 cm2
AV Area VTI: 3.45 cm2
AV Area mean vel: 3.59 cm2
AV Mean grad: 3 mmHg
AV Peak grad: 5.8 mmHg
Ao pk vel: 1.2 m/s
Area-P 1/2: 5.38 cm2
MV VTI: 2.11 cm2
S' Lateral: 2.8 cm

## 2023-01-05 ENCOUNTER — Other Ambulatory Visit: Payer: Self-pay

## 2023-01-05 MED ORDER — FUROSEMIDE 20 MG PO TABS: 20.0000 mg | ORAL_TABLET | Freq: Every day | ORAL | 3 refills | Status: DC

## 2023-01-12 ENCOUNTER — Other Ambulatory Visit: Payer: Self-pay | Admitting: Family Medicine

## 2023-01-17 ENCOUNTER — Telehealth: Payer: Self-pay | Admitting: Family Medicine

## 2023-01-17 MED ORDER — BENZONATATE 200 MG PO CAPS: 200.0000 mg | ORAL_CAPSULE | Freq: Three times a day (TID) | ORAL | 1 refills | Status: DC | PRN

## 2023-01-17 NOTE — Telephone Encounter (Signed)
Patient contacted the office requesting a call back. States she is being treated for bronchitis, has a lingering cough with rattling in her chest. Would like to know if there is any medication [name] could send in or recommend for her to help with cough. States she won't return home until around 3pm today and could take a call at that time or a little later. Please advise [phone].

## 2023-01-17 NOTE — Telephone Encounter (Signed)
She requests a call back  I sent her in [place].

## 2023-01-17 NOTE — Telephone Encounter (Signed)
Mrs. Heaton notified by telephone that Dr. Patsy Lager has sent in a prescription for Tessalon Perles to [facility].

## 2023-01-25 ENCOUNTER — Ambulatory Visit: Payer: Medicare HMO | Attending: Internal Medicine

## 2023-01-25 DIAGNOSIS — Z5181 Encounter for therapeutic drug level monitoring: Secondary | ICD-10-CM | POA: Diagnosis not present

## 2023-01-25 DIAGNOSIS — I480 Paroxysmal atrial fibrillation: Secondary | ICD-10-CM

## 2023-01-25 LAB — POCT INR: INR: 3.7 — AB (ref 2.0–3.0)

## 2023-01-25 NOTE — Patient Instructions (Signed)
Hold warfarin tonight then DECREASE TO 1 tablet daily except 1/2 tablet on Mondays and Fridays.  Repeat INR in 3 weeks

## 2023-02-10 NOTE — Progress Notes (Unsigned)
Cardiology Office Note    Date:  02/13/2023   ID:  [name], [name] 09/08/1945, MRN [id number]  PCP:  [name], MD  Cardiologist:  Yvonne Kendall, MD  Electrophysiologist:  None   Chief Complaint: Follow-up  History of Present Illness:   [name] is a 77 y.o. 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  R/LHC in 2005 showed no significant CAD.  TEE from 2016 demonstrated an EF of 10 to 15% with diffuse hypokinesis nuclear stress test in 2016 showed no evidence of ischemia with an EF of 37%.  Echo from 12/2014 showed an EF of 25 to 30% with moderate mitral regurgitation.  Over the years, EF has continued to improve with echo from 2022 demonstrating an EF of 55 to 60%, no regional wall motion abnormalities, moderate LVH, normal RV systolic function and ventricular cavity size, normal PASP, severely dilated left atrium, prior replacement of the mitral valve with mild regurgitation and no stenosis.  She was last seen in 11/2022 noting a several month history of fatigue with modest activity as well as chest discomfort and shortness of breath that would resolve within a few seconds when sitting down.  She had recently been started on rosuvastatin following abdominal CT that demonstrated aortic atherosclerosis.  Coronary CTA in 11/2022 demonstrated a calcium score of 0 with no evidence of CAD.  Noncardiac over read showed a new moderate hiatal hernia as well as aortic atherosclerosis.  Echo in 12/2022 demonstrated an EF of 60 to 65%, no regional wall motion abnormalities, mild LVH, indeterminate LV diastolic  function parameters, normal RV systolic function and ventricular cavity size, mildly elevated PASP estimated at 42.2 mmHg, prior replacement of the mitral valve with mild regurgitation and no evidence of stenosis, severely dilated left atrium, mild to moderate tricuspid regurgitation, and normal CVP.  Given elevated PASP, she was initiated on furosemide 20 mg daily.  She comes in doing well from a cardiac perspective and is without symptoms of angina or cardiac decompensation.  She has not yet started furosemide as she has been undergoing testing for urinary incontinence.  She indicates that she can start furosemide next week.  No lower extremity swelling, abdominal distention, or progressive orthopnea.  However, she does have to sleep on an incline now secondary to reflux which is likely due to her hiatal hernia.  She does feel like her functional status and breathing are limited secondary to significant bilateral knee pain.  Her weight is down 3 pounds today when compared to her visit in 11/2022.  No falls or symptoms concerning for bleeding.   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)  09/29/2014   a) [facility] Echo: EF ~10% Severe LV dilation & global LV Systolic dysfxn, restrictive filling pattern - Gr 3 DD, mod RV dysfxn, severe LA dilation - 6.4 cm, mod RA dil, mod pl effusion, mod MR, mild TR; b) TEE 10/07/14: EF ~10%, Severe LV dilation & dysfxn Mod RV dysfxn, LAA oversewn, Mod RA & TR   Diverticulosis    H/O: rheumatic fever    HYPERLIPIDEMIA 04/05/2010   Qualifier: Diagnosis of  By: Patsy Lager MD, [place]     Hypothyroid    Migraine headache    Osteopenia    Paroxysmal atrial fibrillation (HCC) 2005; Recurrent 09/2014   a) 2005: s/p Cox Maze & LAA ligation; b) 09/2014: TEE-cardioversion   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  Posterior MV leaflet is fixed and immobile.  MAC.  reduced excursion of the anterior MV leaflet.  Mean MV gradient was 3mm Hg and MVA calculated at cm2.      Urinary incontinence     Past Surgical History:  Procedure Laterality Date   ABDOMINAL HYSTERECTOMY     CARDIAC CATHETERIZATION  Jan 2005   Pre-op R&LHC -- Nonobstructive CAD; normal PA pressures   CARDIOVERSION  06/04/2012   Procedure: CARDIOVERSION;  Surgeon: Luis Abed, MD;  Location: [facility] ENDOSCOPY;  Service: Cardiovascular;  Laterality: N/A;   CARDIOVERSION N/A 10/07/2014   Procedure: CARDIOVERSION;  Surgeon: Quintella Reichert, MD;  Location: [facility] ENDOSCOPY;  Service: Cardiovascular;  Laterality: N/A;   COLONOSCOPY WITH PROPOFOL N/A 07/11/2017   Procedure: COLONOSCOPY WITH PROPOFOL;  Surgeon: Toledo, Boykin Nearing, MD;  Location: [facility] ENDOSCOPY;  Service: Gastroenterology;  Laterality: N/A;   COLONOSCOPY WITH PROPOFOL N/A 05/17/2022   Procedure: COLONOSCOPY WITH PROPOFOL;  Surgeon: Midge Minium, MD;  Location: [facility] ENDOSCOPY;  Service: Endoscopy;  Laterality: N/A;   LUMBAR DISC SURGERY     x 2 distantly    MITRAL VALVE REPAIR  2005   with Cox Maze for [employer]   NM MYOVIEW LTD  4/7/'16   EF 37% with septal hypokinesis and diffuse hypokinesis. No ischemia or infarction. 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   RIGHT HEART CATHETERIZATION N/A 10/03/2014   Procedure: RIGHT HEART CATH;  Surgeon: Dolores Patty, MD;  Location: [facility] CATH LAB;  Service: Cardiovascular;  RAP , RVP 35/2/9 mmHg, PAP 45/12 mmHg, PCWP 16 mmHg; CO/I by Fick: 3.9/2.3; Ao/PA/SVC SaO2%: 97%/63%/65%.   TEE WITHOUT CARDIOVERSION N/A 10/07/2014   Procedure: TRANSESOPHAGEAL ECHOCARDIOGRAM (TEE);  Surgeon: Quintella Reichert, MD;  Location: [facility] ENDOSCOPY;  Service: Cardiovascular;  Laterality: N/A;   THYROIDECTOMY, PARTIAL     partial-no cancer; now on thyroid replacement   TRANSTHORACIC ECHOCARDIOGRAM  2013; 2/'16 & 5/'16   a) 2013: EF 60-65%, mild MR, Gr 2 DD; b) 2/'16 @ [facility]: EF ~10% - severel global LV dysfunction (systolic & diastolic) - dilated LV & restrictive filling pattern - Gr 3 DD, mod reduced RV function, severely  dilated LA - 6.4 cm, mod dilated RA, mod pl effusion, mod MR, mild TR; c) 5/10/'16:  EF 25-30%, mod LV dilation, no RWMA,Gr 3 DD w/ high LAP, MV sewing ring intact w/ Mod MR, Severe LA dilation   TRANSTHORACIC ECHOCARDIOGRAM  January 2017:   EF improved to 45-50%. Mild diffuse HK. Mild MR. Moderate LA dilation. Normal PA pressures.    Current Medications: Current Meds  Medication Sig   acetaminophen (TYLENOL) 500 MG tablet Take 1,000 mg by mouth every 8 (eight) hours as needed.   amiodarone (PACERONE) 200 MG tablet Take 100 mg by mouth daily.   BIOTIN PO Take 1 tablet by mouth daily.   Calcium Carbonate (CALCIUM 600 PO) Take 1 tablet by mouth daily.   Calcium Citrate-Vitamin D (CALCIUM + D PO) Take 1 capsule by mouth daily.   carvedilol (COREG) 3.125 MG tablet Take 1 tablet (3.125 mg total) by mouth 2 (two) times daily.   cetirizine (ZYRTEC) 10 MG tablet  Take 10 mg by mouth daily.   Cholecalciferol (VITAMIN D-3) 125 MCG (5000 UT) TABS Take 1 tablet by mouth daily.   cyanocobalamin (VITAMIN B12) 1000 MCG tablet Take 1,000 mcg by mouth daily.   donepezil (ARICEPT) 5 MG tablet TAKE ONE TABLET BY MOUTH DAILY AT BEDTIME   fluticasone (FLONASE) 50 MCG/ACT nasal spray PLACE TWO SPRAYS INTO BOTH NOSTRILS DAILY   IRON, FERROUS SULFATE, PO Take 1 tablet by mouth daily.   levothyroxine (SYNTHROID) 75 MCG tablet TAKE ONE TAB BY MOUTH ONCE DAILY. TAKE ON AN EMPTY STOMACH WITH A GLASS OF WATER ATLEAST 30-60 MINUTES BEFORE BREAKFAST   losartan (COZAAR) 25 MG tablet TAKE 1 TABLET BY MOUTH ONCE DAILY   Melatonin 5 MG CAPS Take 1 capsule by mouth at bedtime.   metroNIDAZOLE (METROCREAM) 0.75 % cream Apply topically 2 (two) times daily.   Omega-3 Fatty Acids (FISH OIL PO) Take 1 capsule by mouth daily.   pantoprazole (PROTONIX) 40 MG tablet Take 1 tablet (40 mg total) by mouth daily.   permethrin (ELIMITE) 5 % cream APPLY PEA SIZED AMOUNT TO FACE ONCE A DAY   rosuvastatin (CRESTOR) 10 MG tablet Take 1 tablet (10 mg total) by mouth daily.   traZODone (DESYREL) 50 MG tablet TAKE 1/2 TO 1 TABLET BY MOUTH AT BEDTIMEAS NEEDED FOR SLEEP.   warfarin (COUMADIN) 5 MG tablet TAKE 1/2 TO 1 TABLET BY MOUTH DAILY AS DIRECTED BY COUMADIN CLINIC    Allergies:   Patient has no known allergies.   Social History   Socioeconomic History   Marital status: Married    Spouse name: Not on file   Number of children: 2   Years of education: Not on file   Highest education level: Not on file  Occupational History   Occupation: Retired  Tobacco Use   Smoking status: Never   Smokeless tobacco: Never  Vaping Use   Vaping Use: Never used  Substance and Sexual Activity   Alcohol use: Not Currently   Drug use: No   Sexual activity: Yes  Other Topics Concern   Not on file  Social History Narrative   Not on file   Social Determinants of Health   Financial Resource Strain:  Low Risk  (08/15/2022)   Overall Financial Resource Strain (CARDIA)    Difficulty of Paying Living Expenses: Not hard at all  Food Insecurity: No Food Insecurity (08/15/2022)   Hunger Vital Sign    Worried About Running Out of Food in the Last Year:  Never true    Ran Out of Food in the Last Year: Never true  Transportation Needs: No Transportation Needs (08/15/2022)   PRAPARE - Administrator, Civil Service (Medical): No    Lack of Transportation (Non-Medical): No  Physical Activity: Inactive (08/14/2020)   Exercise Vital Sign    Days of Exercise per Week: 0 days    Minutes of Exercise per Session: 0 min  Stress: No Stress Concern Present (08/15/2022)   [employer] of Occupational Health - Occupational Stress Questionnaire    Feeling of Stress : Not at all  Social Connections: Unknown (08/15/2022)   Social Connection and Isolation Panel [NHANES]    Frequency of Communication with Friends and Family: Not on file    Frequency of Social Gatherings with Friends and Family: Not on file    Attends Religious Services: Not on file    Active Member of Clubs or Organizations: Not on file    Attends Banker Meetings: Not on file    Marital Status: Married     Family History:  The patient's family history includes Coronary artery disease in her mother; Diabetes in her mother; Kidney disease in her mother. There is no history of Breast cancer.  ROS:   12-point review of systems is negative unless otherwise noted in the HPI.   EKGs/Labs/Other Studies Reviewed:    Studies reviewed were summarized above. The additional studies were reviewed today:  2D echo 01/03/2023: 1. Left ventricular ejection fraction, by estimation, is 60 to 65%. The  left ventricle has normal function. The left ventricle has no regional  wall motion abnormalities. There is mild left ventricular hypertrophy.  Left ventricular diastolic parameters  are indeterminate. The average left  ventricular global longitudinal strain  is -13.0 %.   2. Right ventricular systolic function is normal. The right ventricular  size is normal. There is mildly elevated pulmonary artery systolic  pressure. The estimated right ventricular systolic pressure is 42.2 mmHg.   3. The mitral valve has been repaired/replaced. Mild mitral valve  regurgitation. No evidence of mitral stenosis. mean gradient 3 mm Hg s/p  prostetic annuloplasty ring   4. Left atrial size was severely dilated.   5. Tricuspid valve regurgitation is mild to moderate.   6. The aortic valve is tricuspid. Aortic valve regurgitation is not  visualized. No aortic stenosis is present.   7. The inferior vena cava is normal in size with greater than 50%  respiratory variability, suggesting right atrial pressure of 3 mmHg.   Comparison(s): 10/20/20: 55-60%, mild LVH, LAE, MV gradient  Hx MVR with prostetic annuloplasty ring.  __________  Coronary CTA 12/22/2022: FINDINGS: Aorta: Normal size. Mild aortic root and descending aorta calcifications. No dissection.   Aortic Valve:  Trileaflet.  No calcifications.   Coronary Arteries:  Normal coronary origin.  Right dominance.   RCA is a dominant artery. There is no plaque.   Left main gives rise to LAD and LCX arteries. LM has no disease.   LAD has no plaque.   LCX is a non-dominant artery.  There is no plaque.   Other findings:   Normal pulmonary vein drainage into the left atrium.   Normal left atrial appendage without a thrombus.   Normal size of the pulmonary artery.   IMPRESSION: 1. Normal coronary calcium score of 0.   2. Normal coronary origin with right dominance.   3. No evidence of CAD.   4. CAD-RADS 0. No evidence of CAD (  0%). Consider non-atherosclerotic causes of chest pain. ___________  2D echo 10/20/2020: 1. Left ventricular ejection fraction, by estimation, is 55 to 60%. The  left ventricle has normal function. The left ventricle has no  regional  wall motion abnormalities. There is moderate left ventricular hypertrophy.  Left ventricular diastolic  parameters are indeterminate.   2. Right ventricular systolic function is normal. The right ventricular  size is normal. Mildly increased right ventricular wall thickness. There  is normal pulmonary artery systolic pressure.   3. Left atrial size was severely dilated.   4. The mitral valve has been repaired/replaced. Mild mitral valve  regurgitation. No evidence of mitral stenosis. The mean mitral valve  gradient is 3.0 mmHg. There is a prosthetic annuloplasty ring present in  the mitral position.   5. The aortic valve is tricuspid. Aortic valve regurgitation is not  visualized. No aortic stenosis is present.  ___________  2D echo 07/26/2017: - Left ventricle: The cavity size was mildly dilated. Systolic    function was normal. The estimated ejection fraction was in the    range of 50% to 55%. Wall motion was normal; there were no    regional wall motion abnormalities. Left ventricular diastolic    function parameters were normal.  - Mitral valve: Prior procedures included surgical repair. There    was mild to moderate regurgitation.  - Left atrium: The atrium was moderately dilated.  - Right ventricle: Systolic function was normal.  - Pulmonary arteries: Systolic pressure was mildly elevated PA peak    pressure: 41 mm Hg (S).   Impressions:   - Sinus bradycardia, rate 51 bpm.  __________  2D echo 09/09/2015: - Left ventricle: The cavity size was mildly dilated. Systolic    function was mildly reduced. The estimated ejection fraction was    in the range of 45% to 50%. Mild diffuse hypokinesis. The study    is not technically sufficient to allow evaluation of LV diastolic    function.  - Aortic valve: There was trivial regurgitation.  - Mitral valve: Moderately thickened leaflets . Transvalvular    velocity was within the normal range. There was no evidence for     stenosis. There was mild regurgitation.  - Left atrium: The atrium was moderately dilated.  - Right ventricle: Systolic function was normal.  - Pulmonary arteries: Systolic pressure was within the normal    range.  __________  2D echo 01/06/2015: - Left ventricle: The cavity size was moderately dilated. Wall    thickness was normal. Systolic function was severely reduced. The    estimated ejection fraction was in the range of 25% to 30%. Wall    motion was normal; there were no regional wall motion    abnormalities. Doppler parameters are consistent with a    reversible restrictive pattern, indicative of decreased left    ventricular diastolic compliance and/or increased left atrial    pressure (grade 3 diastolic dysfunction).  - Mitral valve: The sewing ring appeared normal. There was moderate    regurgitation. Mean gradient (D): 3 mm Hg. Valve area by    continuity equation (using LVOT flow): 1.25 cm^2.  - Left atrium: The atrium was severely dilated.  - Tricuspid valve: There was mild regurgitation.  - Pulmonary arteries: Systolic pressure was within the normal    range.  __________  Eugenie Birks MPI 12/04/2014: Intermediate risk stress nuclear study Secondary to decreased ejection fraction consistent with nonischemic cardiomyopathy. There is no ischemia identified..   LV Ejection Fraction:  37%.  LV Wall Motion:  Septal wall hypokinesis noted. Diffuse hypokinesis otherwise. __________  TEE 10/07/2014: - Left ventricle: The cavity size was severely reduced. Systolic    function was severely reduced. The estimated ejection fraction    was = 10-15%. Diffuse hypokinesis.  - Aortic valve: No evidence of vegetation. There was trivial    regurgitation.  - Mitral valve: Severlely thickened and calcified MV leaflets. The    posterior leaflet is fixed and immobile. There is mitral annular    calcification. There is reduced excursion of the anterior mitral    valve leaflet. Mean MV gradient  was 3mm Hg and MVA calculated at    4.23cm2. There was mild regurgitation.  - Left atrium: The LA appendage has been oversewn and there is a    small residual pouch with no evidence of thrombus. The atrium was    severely dilated. No evidence of thrombus in the atrial cavity or    appendage. No evidence of thrombus in the atrial cavity or    appendage. There was mildintermittent spontaneous echo contrast    (&quot;smoke&quot;).  - Right atrium: The atrium was moderately dilated. No evidence of    thrombus in the atrial cavity or appendage. There was    mildintermittent spontaneous echo contrast (&quot;smoke&quot;) in the    cavity.  - Tricuspid valve: There was moderate regurgitation.  - Pericardium, extracardiac: There was a left pleural effusion.  __________  2D echo 05/20/2011: - Left ventricle: The cavity size was normal. Wall thickness    was normal. Systolic function was normal. The estimated    ejection fraction was in the range of 60% to 65%. Wall    motion was normal; there were no regional wall motion    abnormalities. Features are consistent with a pseudonormal    left ventricular filling pattern, with concomitant    abnormal relaxation and increased filling pressure (grade    2 diastolic dysfunction).  - Left atrium: The atrium was mildly to moderately dilated.    EKG:  EKG is ordered today.  The EKG ordered today demonstrates sinus bradycardia with sinus arrhythmia, 55 bpm, first-degree AV block, nonspecific anterolateral ST-T changes assistant with prior tracings  Recent Labs: 09/20/2022: ALT 15; Hemoglobin 13.1; Platelets 232.0; TSH 4.40 12/01/2022: BUN 15; Creatinine, Ser 0.83; Potassium 4.5; Sodium 140  Recent Lipid Panel    Component Value Date/Time   CHOL 213 (H) 09/20/2022 0840   CHOL 123 09/30/2014 1650   TRIG 165.0 (H) 09/20/2022 0840   TRIG 93 09/30/2014 1650   HDL 58.30 09/20/2022 0840   HDL 53 09/30/2014 1650   CHOLHDL 4 09/20/2022 0840   VLDL 33.0  09/20/2022 0840   VLDL 19 09/30/2014 1650   LDLCALC 122 (H) 09/20/2022 0840   LDLCALC 51 09/30/2014 1650   LDLDIRECT 132.3 03/31/2010 0906    PHYSICAL EXAM:    VS:  BP 126/76 (BP Location: Left Arm, Patient Position: Sitting, Cuff Size: Normal)   Pulse (!) 55   Ht 5' 7.5" (1.715 m)   Wt 177 lb 3.2 oz (80.4 kg)   SpO2 97%   BMI 27.34 kg/m   BMI: Body mass index is 27.34 kg/m.  Physical Exam Vitals reviewed.  Constitutional:      Appearance: She is well-developed.  HENT:     Head: Normocephalic and atraumatic.  Eyes:     General:        Right eye: No discharge.        Left eye:  No discharge.  Neck:     Vascular: No JVD.  Cardiovascular:     Rate and Rhythm: Normal rate and regular rhythm.     Pulses:          Posterior tibial pulses are 2+ on the right side and 2+ on the left side.     Heart sounds: Normal heart sounds, S1 normal and S2 normal. Heart sounds not distant. No midsystolic click and no opening snap. No murmur heard.    No friction rub.  Pulmonary:     Effort: Pulmonary effort is normal. No respiratory distress.     Breath sounds: Normal breath sounds. No decreased breath sounds, wheezing or rales.  Chest:     Chest wall: No tenderness.  Abdominal:     General: There is no distension.  Musculoskeletal:     Cervical back: Normal range of motion.     Right lower leg: No edema.     Left lower leg: No edema.  Skin:    General: Skin is warm and dry.     Nails: There is no clubbing.  Neurological:     Mental Status: She is alert and oriented to person, place, and time.  Psychiatric:        Speech: Speech normal.        Behavior: Behavior normal.        Thought Content: Thought content normal.        Judgment: Judgment normal.     Wt Readings from Last 3 Encounters:  02/13/23 177 lb 3.2 oz (80.4 kg)  12/28/22 178 lb 2 oz (80.8 kg)  12/01/22 180 lb (81.6 kg)     ASSESSMENT & PLAN:   NICM: Recent echo demonstrated preserved LV systolic function with  elevated RVSP estimated at 42.2 mmHg.  In this setting, it was recommended she start furosemide 20 mg daily.  She has not yet started furosemide given ongoing workup for urinary incontinence.  She indicates that she can start furosemide 20 mg next week.  I suspect her dyspnea is multifactorial including physical deconditioning from knee pain, elevated RVSP, and in the setting of her hiatal hernia.  Recommend follow-up renal function and electrolytes 1 week after initiating furosemide.  She is without evidence of cardiac decompensation at this time.  Valvular heart disease: Echo performed last month showed normal functioning mitral valve repair.  No murmur noted on exam.  Monitor with periodic echo.  PAF: Maintaining sinus rhythm with a mildly bradycardic rate.  She remains on low-dose amiodarone 100 mg daily and carvedilol.  LFTs and TSH normal earlier this year.  CHADS2VASc 5 (CHF, age x 2, vascular disease, sex category).  Remains on warfarin with INR 3.7 in 12/2022, followed by Coumadin clinic.  No symptoms concerning for bleeding.  Recent Hgb stable.  Aortic atherosclerosis/HLD: LDL 122 in 08/2022 with normal AST/ALT at that time.  Now on rosuvastatin.  Obtain fasting lipid panel and LFT in 1 week when she returns for follow-up labs.   Disposition: F/u with Dr. Okey Dupre or an APP in 2 months.   Medication Adjustments/Labs and Tests Ordered: Current medicines are reviewed at length with the patient today.  Concerns regarding medicines are outlined above. Medication changes, Labs and Tests ordered today are summarized above and listed in the Patient Instructions accessible in Encounters.   SignedEula Listen, PA-C 02/13/2023 11:23 AM     [facility] [place] [address] [place], [place] [address] [phone]

## 2023-02-13 ENCOUNTER — Ambulatory Visit: Payer: Medicare HMO | Attending: Physician Assistant | Admitting: Physician Assistant

## 2023-02-13 ENCOUNTER — Encounter: Payer: Self-pay | Admitting: Physician Assistant

## 2023-02-13 VITALS — BP 126/76 | HR 55 | Ht 67.5 in | Wt 177.2 lb

## 2023-02-13 DIAGNOSIS — I058 Other rheumatic mitral valve diseases: Secondary | ICD-10-CM

## 2023-02-13 DIAGNOSIS — Z79899 Other long term (current) drug therapy: Secondary | ICD-10-CM

## 2023-02-13 DIAGNOSIS — I700 Atherosclerosis of aorta: Secondary | ICD-10-CM | POA: Diagnosis not present

## 2023-02-13 DIAGNOSIS — Z7901 Long term (current) use of anticoagulants: Secondary | ICD-10-CM | POA: Diagnosis not present

## 2023-02-13 DIAGNOSIS — Z9889 Other specified postprocedural states: Secondary | ICD-10-CM

## 2023-02-13 DIAGNOSIS — I480 Paroxysmal atrial fibrillation: Secondary | ICD-10-CM | POA: Diagnosis not present

## 2023-02-13 DIAGNOSIS — I428 Other cardiomyopathies: Secondary | ICD-10-CM

## 2023-02-13 NOTE — Patient Instructions (Signed)
Medication Instructions:  Start Taking: Lasix 20 mg daily next week  *If you need a refill on your cardiac medications before your next appointment, please call your pharmacy*   Lab Work: Your provider would like for you to return in 1 week to have the following labs drawn: CMP, Lipid.   Please go to the [facility] entrance and check in at the front desk.  You do not need an appointment.  They are open from 7am-6 pm.  You need to be fasting.  If you have labs (blood work) drawn today and your tests are completely normal, you will receive your results only by: MyChart Message (if you have MyChart) OR A paper copy in the mail If you have any lab test that is abnormal or we need to change your treatment, we will call you to review the results.   Testing/Procedures: none   Follow-Up: At [facility], you and your health needs are our priority.  As part of our continuing mission to provide you with exceptional heart care, we have created designated Provider Care Teams.  These Care Teams include your primary Cardiologist (physician) and Advanced Practice Providers (APPs -  Physician Assistants and Nurse Practitioners) who all work together to provide you with the care you need, when you need it.  We recommend signing up for the patient portal called "MyChart".  Sign up information is provided on this After Visit Summary.  MyChart is used to connect with patients for Virtual Visits (Telemedicine).  Patients are able to view lab/test results, encounter notes, upcoming appointments, etc.  Non-urgent messages can be sent to your provider as well.   To learn more about what you can do with MyChart, go to [email or web address].    Your next appointment:   2 month(s)  Provider:   Yvonne Kendall, MD or Eula Listen, PA-C

## 2023-02-15 ENCOUNTER — Ambulatory Visit: Payer: Medicare HMO

## 2023-02-22 ENCOUNTER — Other Ambulatory Visit
Admission: RE | Admit: 2023-02-22 | Discharge: 2023-02-22 | Disposition: A | Discharge status: Home / Self Care | Payer: Medicare HMO | Source: Ambulatory Visit | Attending: Physician Assistant | Admitting: Physician Assistant

## 2023-02-22 ENCOUNTER — Ambulatory Visit: Payer: Medicare HMO | Attending: Internal Medicine

## 2023-02-22 DIAGNOSIS — I700 Atherosclerosis of aorta: Secondary | ICD-10-CM | POA: Insufficient documentation

## 2023-02-22 DIAGNOSIS — I480 Paroxysmal atrial fibrillation: Secondary | ICD-10-CM

## 2023-02-22 DIAGNOSIS — Z5181 Encounter for therapeutic drug level monitoring: Secondary | ICD-10-CM

## 2023-02-22 DIAGNOSIS — Z79899 Other long term (current) drug therapy: Secondary | ICD-10-CM | POA: Insufficient documentation

## 2023-02-22 LAB — COMPREHENSIVE METABOLIC PANEL
ALT: 26 U/L (ref 0–44)
AST: 26 U/L (ref 15–41)
Albumin: 4 g/dL (ref 3.5–5.0)
Alkaline Phosphatase: 78 U/L (ref 38–126)
Anion gap: 8 (ref 5–15)
BUN: 19 mg/dL (ref 8–23)
CO2: 24 mmol/L (ref 22–32)
Calcium: 8.9 mg/dL (ref 8.9–10.3)
Chloride: 106 mmol/L (ref 98–111)
Creatinine, Ser: 0.97 mg/dL (ref 0.44–1.00)
GFR, Estimated: 60 mL/min — ABNORMAL LOW (ref >60)
Glucose, Bld: 105 mg/dL — ABNORMAL HIGH (ref 70–99)
Potassium: 4.3 mmol/L (ref 3.5–5.1)
Sodium: 138 mmol/L (ref 135–145)
Total Bilirubin: 1 mg/dL (ref 0.3–1.2)
Total Protein: 6.7 g/dL (ref 6.5–8.1)

## 2023-02-22 LAB — POCT INR: INR: 2.7 (ref 2.0–3.0)

## 2023-02-22 LAB — LIPID PANEL
Cholesterol: 125 mg/dL (ref 0–200)
HDL: 47 mg/dL (ref >40)
LDL Cholesterol: 52 mg/dL (ref 0–99)
Total CHOL/HDL Ratio: 2.7 RATIO
Triglycerides: 130 mg/dL (ref <150)
VLDL: 26 mg/dL (ref 0–40)

## 2023-02-22 NOTE — Patient Instructions (Signed)
CONTINUE 1 tablet daily except 1/2 tablet on Mondays and Fridays.  Repeat INR in 6 weeks

## 2023-02-27 ENCOUNTER — Other Ambulatory Visit: Payer: Self-pay | Admitting: Family Medicine

## 2023-03-17 ENCOUNTER — Other Ambulatory Visit: Payer: Self-pay | Admitting: Family Medicine

## 2023-03-26 NOTE — Progress Notes (Unsigned)
    Deborah Baruch T. Kruze Atchley, MD, CAQ Sports Medicine [facility] [address] [place], [address]  Phone: [phone]  FAX: [phone]  [name] - 77 y.o. female  MRN [id number]  Date of Birth: March 30, 1946  Date: 03/27/2023  PCP: [name], MD  Referral: [name], MD  No chief complaint on file.  Subjective:   [name] is a 77 y.o. very pleasant female patient with There is no height or weight on file to calculate BMI. who presents with the following:  The patient presents with worries about parasite infection.  She has had her veterinarian look for parasites in the past.    Review of Systems is noted in the HPI, as appropriate  Objective:   There were no vitals taken for this visit.  GEN: No acute distress; alert,appropriate. PULM: Breathing comfortably in no respiratory distress PSYCH: Normally interactive.   Laboratory and Imaging Data:  Assessment and Plan:   ***

## 2023-03-27 ENCOUNTER — Encounter: Payer: Self-pay | Admitting: Family Medicine

## 2023-03-27 ENCOUNTER — Ambulatory Visit (INDEPENDENT_AMBULATORY_CARE_PROVIDER_SITE_OTHER): Payer: Medicare HMO | Admitting: Family Medicine

## 2023-03-27 VITALS — BP 110/68 | HR 65 | Temp 97.9°F | Ht 67.5 in | Wt 176.4 lb

## 2023-03-27 DIAGNOSIS — B719 Cestode infection, unspecified: Secondary | ICD-10-CM

## 2023-03-27 DIAGNOSIS — M170 Bilateral primary osteoarthritis of knee: Secondary | ICD-10-CM

## 2023-03-27 MED ORDER — TRIAMCINOLONE ACETONIDE 40 MG/ML IJ SUSP
40.0000 mg | Freq: Once | INTRAMUSCULAR | Status: AC
Start: 2023-03-27 — End: 2023-03-27
  Administered 2023-03-27: 40 mg via INTRA_ARTICULAR

## 2023-03-28 ENCOUNTER — Emergency Department (HOSPITAL_COMMUNITY)
Admission: EM | Admit: 2023-03-28 | Discharge: 2023-03-29 | Disposition: A | Discharge status: Home / Self Care | Payer: Medicare HMO | Attending: Emergency Medicine | Admitting: Emergency Medicine

## 2023-03-28 ENCOUNTER — Emergency Department (HOSPITAL_COMMUNITY): Payer: Medicare HMO

## 2023-03-28 ENCOUNTER — Other Ambulatory Visit: Payer: Self-pay

## 2023-03-28 ENCOUNTER — Encounter (HOSPITAL_COMMUNITY): Payer: Self-pay | Admitting: Emergency Medicine

## 2023-03-28 DIAGNOSIS — I493 Ventricular premature depolarization: Secondary | ICD-10-CM

## 2023-03-28 DIAGNOSIS — I110 Hypertensive heart disease with heart failure: Secondary | ICD-10-CM | POA: Insufficient documentation

## 2023-03-28 DIAGNOSIS — Z7901 Long term (current) use of anticoagulants: Secondary | ICD-10-CM | POA: Insufficient documentation

## 2023-03-28 DIAGNOSIS — I4891 Unspecified atrial fibrillation: Secondary | ICD-10-CM

## 2023-03-28 DIAGNOSIS — I509 Heart failure, unspecified: Secondary | ICD-10-CM | POA: Diagnosis not present

## 2023-03-28 DIAGNOSIS — Z8673 Personal history of transient ischemic attack (TIA), and cerebral infarction without residual deficits: Secondary | ICD-10-CM | POA: Insufficient documentation

## 2023-03-28 DIAGNOSIS — Z79899 Other long term (current) drug therapy: Secondary | ICD-10-CM | POA: Insufficient documentation

## 2023-03-28 DIAGNOSIS — Z1152 Encounter for screening for COVID-19: Secondary | ICD-10-CM | POA: Diagnosis not present

## 2023-03-28 DIAGNOSIS — R7989 Other specified abnormal findings of blood chemistry: Secondary | ICD-10-CM | POA: Diagnosis not present

## 2023-03-28 DIAGNOSIS — D72829 Elevated white blood cell count, unspecified: Secondary | ICD-10-CM | POA: Diagnosis not present

## 2023-03-28 DIAGNOSIS — R0602 Shortness of breath: Secondary | ICD-10-CM | POA: Diagnosis present

## 2023-03-28 DIAGNOSIS — R0600 Dyspnea, unspecified: Secondary | ICD-10-CM

## 2023-03-28 DIAGNOSIS — R079 Chest pain, unspecified: Secondary | ICD-10-CM

## 2023-03-28 LAB — CBC
HCT: 38.1 % (ref 36.0–46.0)
Hemoglobin: 12.2 g/dL (ref 12.0–15.0)
MCH: 32.4 pg (ref 26.0–34.0)
MCHC: 32 g/dL (ref 30.0–36.0)
MCV: 101.3 fL — ABNORMAL HIGH (ref 80.0–100.0)
Platelets: 206 10*3/uL (ref 150–400)
RBC: 3.76 MIL/uL — ABNORMAL LOW (ref 3.87–5.11)
RDW: 13.9 % (ref 11.5–15.5)
WBC: 13.3 10*3/uL — ABNORMAL HIGH (ref 4.0–10.5)
nRBC: 0 % (ref 0.0–0.2)

## 2023-03-28 LAB — TROPONIN I (HIGH SENSITIVITY)
Troponin I (High Sensitivity): 7 ng/L (ref <18)
Troponin I (High Sensitivity): 8 ng/L (ref <18)

## 2023-03-28 LAB — URINALYSIS, ROUTINE W REFLEX MICROSCOPIC
Bilirubin Urine: NEGATIVE
Glucose, UA: NEGATIVE mg/dL
Hgb urine dipstick: NEGATIVE
Ketones, ur: NEGATIVE mg/dL
Leukocytes,Ua: NEGATIVE
Nitrite: NEGATIVE
Protein, ur: NEGATIVE mg/dL
Specific Gravity, Urine: 1.024 (ref 1.005–1.030)
pH: 7 (ref 5.0–8.0)

## 2023-03-28 LAB — SARS CORONAVIRUS 2 BY RT PCR: SARS Coronavirus 2 by RT PCR: NEGATIVE

## 2023-03-28 LAB — PROTIME-INR
INR: 2.1 — ABNORMAL HIGH (ref 0.8–1.2)
Prothrombin Time: 23.9 seconds — ABNORMAL HIGH (ref 11.4–15.2)

## 2023-03-28 LAB — BASIC METABOLIC PANEL
Anion gap: 13 (ref 5–15)
BUN: 20 mg/dL (ref 8–23)
CO2: 18 mmol/L — ABNORMAL LOW (ref 22–32)
Calcium: 9.3 mg/dL (ref 8.9–10.3)
Chloride: 109 mmol/L (ref 98–111)
Creatinine, Ser: 0.89 mg/dL (ref 0.44–1.00)
GFR, Estimated: >60 mL/min (ref >60)
Glucose, Bld: 166 mg/dL — ABNORMAL HIGH (ref 70–99)
Potassium: 4 mmol/L (ref 3.5–5.1)
Sodium: 140 mmol/L (ref 135–145)

## 2023-03-28 LAB — BRAIN NATRIURETIC PEPTIDE: B Natriuretic Peptide: 209.8 pg/mL — ABNORMAL HIGH (ref 0.0–100.0)

## 2023-03-28 LAB — TSH: TSH: 0.615 u[IU]/mL (ref 0.350–4.500)

## 2023-03-28 MED ORDER — IOHEXOL 350 MG/ML SOLN
75.0000 mL | Freq: Once | INTRAVENOUS | Status: AC | PRN
  Administered 2023-03-28: 75 mL via INTRAVENOUS

## 2023-03-28 MED ORDER — CARVEDILOL 3.125 MG PO TABS
6.2500 mg | ORAL_TABLET | Freq: Two times a day (BID) | ORAL | Status: DC
  Administered 2023-03-28: 6.25 mg via ORAL
  Filled 2023-03-28: qty 2

## 2023-03-28 NOTE — ED Notes (Signed)
Gone to Xray  

## 2023-03-28 NOTE — Discharge Instructions (Addendum)
Your heart rate was initially elevated significant for atrial fibrillation with rapid ventricular response but this subsequently improved without significant intervention.  Your INR level was therapeutic at 2.1.  Continue taking your Coumadin as instructed.  Your cardiac enzymes were normal and your CT imaging was reassuring.   IMPRESSION:  1. No evidence of pulmonary embolism.  2. Moderate severity cardiomegaly with a markedly dilated left  atrium.  3. Moderate-sized hiatal hernia.  4. Aortic atherosclerosis.    Aortic Atherosclerosis (ICD10-I70.0).   Please follow-up outpatient with a cardiologist.  Recommend doubling your home Coreg until you can obtain follow-up, return for any severe worsening symptoms.

## 2023-03-28 NOTE — ED Provider Notes (Addendum)
[place] EMERGENCY DEPARTMENT AT [facility] Provider Note   CSN: [id number] Arrival date & time: 03/28/23  0102     History  Chief Complaint  Patient presents with   Shortness of Breath   Chest Pain    [name] is a 77 y.o. female.   Shortness of Breath Associated symptoms: chest pain   Associated symptoms: no cough   Chest Pain Associated symptoms: palpitations and shortness of breath   Associated symptoms: no cough      77 year old female with medical history significant for atrial fibrillation on Coumadin, cardiomyopathy, CHF, HLD, CVA, HTN, status post mitral valve repair who presents to the emergency department with heart palpitations, chest discomfort and shortness of breath.  Patient states that she has been noticing palpitations at home all throughout the day yesterday and today.  She endorsed sharp chest pain that was pleuritic in nature with associated shortness of breath since last night.  She reports feeling flushed and feeling mild dyspnea.  She states that her heart rate has been slightly elevated at home.  She has been compliant with her home medications and has been taking her home amiodarone and Coreg.  She denies any lower extremity swelling.  No cough, fever or chills.  Home Medications Prior to Admission medications   Medication Sig Start Date End Date Taking? Authorizing Provider  acetaminophen (TYLENOL) 500 MG tablet Take 1,000 mg by mouth every 8 (eight) hours as needed.    [provider]  amiodarone (PACERONE) 200 MG tablet Take 100 mg by mouth daily. 07/30/22   [provider]  BIOTIN PO Take 1 tablet by mouth daily.    [provider]  Calcium Carbonate (CALCIUM 600 PO) Take 1 tablet by mouth daily.    [provider]  Calcium Citrate-Vitamin D (CALCIUM + D PO) Take 1 capsule by mouth daily.    [provider]  carvedilol (COREG) 3.125 MG tablet Take 1 tablet (3.125 mg total) by mouth 2 (two)  times daily. 12/26/22   [name], Karleen Hampshire, MD  cetirizine (ZYRTEC) 10 MG tablet Take 10 mg by mouth daily.    [provider]  Cholecalciferol (VITAMIN D-3) 125 MCG (5000 UT) TABS Take 1 tablet by mouth daily.    [provider]  cyanocobalamin (VITAMIN B12) 1000 MCG tablet Take 1,000 mcg by mouth daily.    [provider]  donepezil (ARICEPT) 5 MG tablet TAKE ONE TABLET BY MOUTH DAILY AT BEDTIME 01/12/23   [name], Spencer, MD  fluticasone (FLONASE) 50 MCG/ACT nasal spray PLACE TWO SPRAYS INTO BOTH NOSTRILS DAILY 09/26/22   [name], Karleen Hampshire, MD  furosemide (LASIX) 20 MG tablet Take 1 tablet (20 mg total) by mouth daily. 01/05/23 04/05/23  End, Cristal Deer, MD  IRON, FERROUS SULFATE, PO Take 1 tablet by mouth daily.    [provider]  levothyroxine (SYNTHROID) 75 MCG tablet TAKE ONE TAB BY MOUTH ONCE DAILY. TAKE ON AN EMPTY STOMACH WITH A GLASS OF WATER ATLEAST 30-60 MINUTES BEFORE BREAKFAST 12/05/22   [name], Karleen Hampshire, MD  losartan (COZAAR) 25 MG tablet TAKE 1 TABLET BY MOUTH ONCE DAILY 09/01/22   [name], Karleen Hampshire, MD  Melatonin 5 MG CAPS Take 1 capsule by mouth at bedtime.    [provider]  metroNIDAZOLE (METROCREAM) 0.75 % cream Apply topically 2 (two) times daily. 05/15/20   [name], Karleen Hampshire, MD  nitrofurantoin, macrocrystal-monohydrate, (MACROBID) 100 MG capsule Take 100 mg by mouth daily. 03/13/23   [provider]  Omega-3 Fatty  Acids (FISH OIL PO) Take 1 capsule by mouth daily.    [provider]  pantoprazole (PROTONIX) 40 MG tablet TAKE ONE TABLET BY MOUTH DAILY 03/17/23   [name], Karleen Hampshire, MD  permethrin (ELIMITE) 5 % cream APPLY PEA SIZED AMOUNT TO FACE ONCE A DAY 03/22/18   [provider]  rosuvastatin (CRESTOR) 10 MG tablet Take 1 tablet (10 mg total) by mouth daily. 09/26/22   [name], Karleen Hampshire, MD  traZODone (DESYREL) 50 MG tablet TAKE ONE HALF (1/2) TO ONE TABLET BY MOUTH AT BEDTIME AS NEEDED FOR SLEEP. 02/27/23   [name],  Karleen Hampshire, MD  Vibegron (GEMTESA) 75 MG TABS Take 1 tablet by mouth daily.    [provider]  warfarin (COUMADIN) 5 MG tablet TAKE 1/2 TO 1 TABLET BY MOUTH DAILY AS DIRECTED BY COUMADIN CLINIC 09/20/22   End, Cristal Deer, MD      Allergies    Patient has no known allergies.    Review of Systems   Review of Systems  Respiratory:  Positive for shortness of breath. Negative for cough.   Cardiovascular:  Positive for chest pain and palpitations. Negative for leg swelling.  All other systems reviewed and are negative.   Physical Exam Updated Vital Signs BP (!) 150/81   Pulse 76   Temp 98.4 F (36.9 C)   Resp (!) 25   Ht 5' 7.5" (1.715 m)   Wt 80 kg   SpO2 96%   BMI 27.22 kg/m  Physical Exam Vitals and nursing note reviewed.  Constitutional:      General: She is not in acute distress.    Appearance: She is well-developed.  HENT:     Head: Normocephalic and atraumatic.  Eyes:     Conjunctiva/sclera: Conjunctivae normal.  Cardiovascular:     Rate and Rhythm: Normal rate and regular rhythm.     Heart sounds: No murmur heard. Pulmonary:     Effort: Pulmonary effort is normal. No respiratory distress.     Breath sounds: Normal breath sounds.  Abdominal:     Palpations: Abdomen is soft.     Tenderness: There is no abdominal tenderness.  Musculoskeletal:        General: No swelling.     Cervical back: Neck supple.  Skin:    General: Skin is warm and dry.     Capillary Refill: Capillary refill takes less than 2 seconds.  Neurological:     Mental Status: She is alert.  Psychiatric:        Mood and Affect: Mood is anxious.     ED Results / Procedures / Treatments   Labs (all labs ordered are listed, but only abnormal results are displayed) Labs Reviewed  BASIC METABOLIC PANEL - Abnormal; Notable for the following components:      Result Value   CO2 18 (*)    Glucose, Bld 166 (*)    All other components within normal limits  CBC - Abnormal; Notable for the  following components:   WBC 13.3 (*)    RBC 3.76 (*)    MCV 101.3 (*)    All other components within normal limits  BRAIN NATRIURETIC PEPTIDE - Abnormal; Notable for the following components:   B Natriuretic Peptide 209.8 (*)    All other components within normal limits  PROTIME-INR - Abnormal; Notable for the following components:   Prothrombin Time 23.9 (*)    INR 2.1 (*)    All other components within normal limits  SARS CORONAVIRUS 2 BY RT PCR  TSH  T4, FREE  URINALYSIS, ROUTINE W REFLEX MICROSCOPIC  TROPONIN I (HIGH SENSITIVITY)  TROPONIN I (HIGH SENSITIVITY)    EKG EKG Interpretation Date/Time:  Tuesday March 28 2023 18:34:21 EDT Ventricular Rate:  101 PR Interval:    QRS Duration:  102 QT Interval:  356 QTC Calculation: 461 R Axis:   80  Text Interpretation: Atrial fibrillation Marked ST abnormality, possible inferior subendocardial injury Abnormal ECG Premature ventricular complexes Confirmed by Ernie Avena ([id number]) on 03/28/2023 6:57:36 PM  Radiology CT Angio Chest PE W and/or Wo Contrast  Result Date: 03/28/2023 CLINICAL DATA:  Chest pain and shortness of breath. EXAM: CT ANGIOGRAPHY CHEST WITH CONTRAST TECHNIQUE: Multidetector CT imaging of the chest was performed using the standard protocol during bolus administration of intravenous contrast. Multiplanar CT image reconstructions and MIPs were obtained to evaluate the vascular anatomy. RADIATION DOSE REDUCTION: This exam was performed according to the departmental dose-optimization program which includes automated exposure control, adjustment of the mA and/or kV according to patient size and/or use of iterative reconstruction technique. CONTRAST:  75mL OMNIPAQUE IOHEXOL 350 MG/ML SOLN COMPARISON:  None Available. FINDINGS: Cardiovascular: There is mild calcification of the aortic arch, without evidence of aortic aneurysm. Satisfactory opacification of the pulmonary arteries to the segmental level. No evidence of pulmonary  embolism. There is moderate severity cardiomegaly with dilatation of the left atrium. No pericardial effusion. Mediastinum/Nodes: No enlarged mediastinal, hilar, or axillary lymph nodes. Thyroid gland, trachea, and esophagus demonstrate no significant findings. Lungs/Pleura: Mild atelectatic changes are seen within the bilateral lung bases. There is no evidence of acute infiltrate, pleural effusion or pneumothorax. Upper Abdomen: There is a moderate-sized hiatal hernia. Musculoskeletal: Multiple sternal wires are present. Multilevel degenerative changes seen throughout the thoracic spine. Review of the MIP images confirms the above findings. IMPRESSION: 1. No evidence of pulmonary embolism. 2. Moderate severity cardiomegaly with a markedly dilated left atrium. 3. Moderate-sized hiatal hernia. 4. Aortic atherosclerosis. Aortic Atherosclerosis (ICD10-I70.0). Electronically Signed   By: Aram Candela M.D.   On: 03/28/2023 20:47   DG Chest 2 View  Result Date: 03/28/2023 CLINICAL DATA:  Chest pain short of breath EXAM: CHEST - 2 VIEW COMPARISON:  07/29/2022 FINDINGS: Post sternotomy changes. Mild cardiomegaly. Minimal atelectasis or scarring at the bases. No acute airspace disease, pleural effusion or pneumothorax. IMPRESSION: Mild cardiomegaly. Electronically Signed   By: Jasmine Pang M.D.   On: 03/28/2023 19:30    Procedures Procedures    Medications Ordered in ED Medications  carvedilol (COREG) tablet 6.25 mg (6.25 mg Oral Given 03/28/23 2247)  iohexol (OMNIPAQUE) 350 MG/ML injection 75 mL (75 mLs Intravenous Contrast Given 03/28/23 2032)    ED Course/ Medical Decision Making/ A&P Clinical Course as of 03/28/23 2326  Tue Mar 28, 2023  2025 Pulse Rate(!): 129 [JL]  2025 INR(!): 2.1 [JL]    Clinical Course User Index [JL] Ernie Avena, MD                                 Medical Decision Making Amount and/or Complexity of Data Reviewed Labs: ordered. Decision-making details documented  in ED Course. Radiology: ordered.  Risk Prescription drug management.    77 year old female with medical history significant for atrial fibrillation on Coumadin, cardiomyopathy, CHF, HLD, CVA, HTN, status post mitral valve repair who presents to the emergency department with heart palpitations, chest discomfort and shortness of breath.  Patient states that she has been noticing palpitations  at home all throughout the day yesterday and today.  She endorsed sharp chest pain that was pleuritic in nature with associated shortness of breath since last night.  She reports feeling flushed and feeling mild dyspnea.  She states that her heart rate has been slightly elevated at home.  She has been compliant with her home medications and has been taking her home amiodarone and Coreg.  She denies any lower extremity swelling.  No cough, fever or chills.  On arrival, the patient was afebrile, initially tachycardic in atrial fibrillation with RVR, noted on cardiac telemetry to 129, BP 128/85, tachypneic RR 22, saturating 98% on room air.  She subsequently had improvement in her heart rate, remained in atrial fibrillation on telemetry.  Differential diagnosis includes atrial fibrillation RVR, thyroid dysfunction, electrolyte abnormality, PE.  Considered ACS, pneumonia, pneumothorax.  Initial EKG obtained revealed atrial fibrillation, ventricular rate 101, nonspecific ST changes present, no STEMI.  A chest x-ray was performed which revealed cardiomegaly with no evidence of pulmonary edema.  A CTA PE study was performed: IMPRESSION:  1. No evidence of pulmonary embolism.  2. Moderate severity cardiomegaly with a markedly dilated left  atrium.  3. Moderate-sized hiatal hernia.  4. Aortic atherosclerosis.    Aortic Atherosclerosis (ICD10-I70.0).    Laboratory evaluation revealed CBC with a nonspecific leukocytosis to 13.3, no anemia hemoglobin 12.2, BMP generally unremarkable, mildly low bicarb at 18 with a  normal anion gap, blood glucose 166, BNP nonspecifically elevated at 210, COVID-19 PCR testing negative, troponins x 2 normal.  An INR was found to be therapeutic at 2.1.  The patient was administered her home Coreg and remained in rate control.  She felt symptomatically improved and had no chest pain or shortness of breath on repeat evaluation. TSH was normal, free T4 was mildly elevated, UA collected and negative.  Overall reassuring workup, patient asymptomatic on repeat assessment.  I advised that the patient double her dose of home Coreg and follow-up closely with her cardiologist.  At time of discharge, the patient was resting comfortably, respiratory rate of 16-18, saturating well on room air, heart rate well-controlled in the 70s.  The patient has been appropriately medically screened and/or stabilized in the ED. I have low suspicion for any other emergent medical condition which would require further screening, evaluation or treatment in the ED or require inpatient management.    Final Clinical Impression(s) / ED Diagnoses Final diagnoses:  Atrial fibrillation with RVR (HCC)  Chest pain, unspecified type  Dyspnea, unspecified type    Rx / DC Orders ED Discharge Orders     None             Ernie Avena, MD 03/29/23 0001

## 2023-03-28 NOTE — ED Triage Notes (Signed)
Patient arrives ambulatory by POV c/o chest pain and shortness of breath onset of last night. Reports feeling flushed and having a hard time catching her breath. Also reports tachycardia.

## 2023-03-29 LAB — T4, FREE: Free T4: 1.28 ng/dL — ABNORMAL HIGH (ref 0.61–1.12)

## 2023-03-30 ENCOUNTER — Telehealth: Payer: Self-pay

## 2023-03-30 NOTE — Transitions of Care (Post Inpatient/ED Visit) (Signed)
03/30/2023  Name: [name] MRN: [id number] DOB: 1946-03-08  Today's TOC FU Call Status: Today's TOC FU Call Status:: Successful TOC FU Call Completed Unsuccessful Call (1st Attempt) Date: 03/30/23 [facility] FU Call Complete Date: 03/30/23  Transition Care Management Follow-up Telephone Call Date of Discharge: 03/29/23 Discharge Facility[facility] Type of Discharge: Emergency Department Reason for ED Visit: Cardiac Conditions, Other: (a-fib with RVR) Cardiac Conditions Diagnosis:  (Atrial fibrillation with RVR) How have you been since you were released from the hospital?: Better Any questions or concerns?: No  Items Reviewed: Did you receive and understand the discharge instructions provided?: Yes Medications obtained,verified, and reconciled?: Yes (Medications Reviewed) Any new allergies since your discharge?: No Dietary orders reviewed?: Yes Do you have support at home?: Yes  Medications Reviewed Today: Medications Reviewed Today     Reviewed by Merleen Nicely, LPN (Licensed Practical Nurse) on 03/30/23 at 1044  Med List Status: <None>   Medication Order Taking? Sig Documenting Provider Last Dose Status Informant  acetaminophen (TYLENOL) 500 MG tablet [id number] Yes Take 1,000 mg by mouth every 8 (eight) hours as needed. [provider] Taking Active   amiodarone (PACERONE) 200 MG tablet [id number] Yes Take 100 mg by mouth daily. [provider] Taking Active   BIOTIN PO [id number] Yes Take 1 tablet by mouth daily. [provider] Taking Active   Calcium Carbonate (CALCIUM 600 PO) [id number] Yes Take 1 tablet by mouth daily. [provider] Taking Active   Calcium Citrate-Vitamin D (CALCIUM + D PO) [id number] Yes Take 1 capsule by mouth daily. [provider] Taking Active Self  carvedilol (COREG) 3.125 MG tablet [id number] Yes Take 1 tablet (3.125 mg total) by mouth 2 (two) times daily. [name], Karleen Hampshire, MD Taking Active    cetirizine (ZYRTEC) 10 MG tablet [id number] Yes Take 10 mg by mouth daily. [provider] Taking Active   Cholecalciferol (VITAMIN D-3) 125 MCG (5000 UT) TABS [id number] Yes Take 1 tablet by mouth daily. [provider] Taking Active   cyanocobalamin (VITAMIN B12) 1000 MCG tablet [id number] Yes Take 1,000 mcg by mouth daily. [provider] Taking Active   donepezil (ARICEPT) 5 MG tablet [id number] Yes TAKE ONE TABLET BY MOUTH DAILY AT BEDTIME [name], Spencer, MD Taking Active   fluticasone (FLONASE) 50 MCG/ACT nasal spray [id number] Yes PLACE TWO SPRAYS INTO BOTH NOSTRILS DAILY [name], Spencer, MD Taking Active   furosemide (LASIX) 20 MG tablet [id number] Yes Take 1 tablet (20 mg total) by mouth daily. Yvonne Kendall, MD Taking Active   IRON, FERROUS SULFATE, PO [id number] Yes Take 1 tablet by mouth daily. [provider] Taking Active Self  levothyroxine (SYNTHROID) 75 MCG tablet [id number] Yes TAKE ONE TAB BY MOUTH ONCE DAILY. TAKE ON AN EMPTY STOMACH WITH A GLASS OF WATER ATLEAST 30-60 MINUTES BEFORE BREAKFAST [name], Spencer, MD Taking Active   losartan (COZAAR) 25 MG tablet [id number] Yes TAKE 1 TABLET BY MOUTH ONCE DAILY [name], Spencer, MD Taking Active   Melatonin 5 MG CAPS [id number] Yes Take 1 capsule by mouth at bedtime. [provider] Taking Active   metroNIDAZOLE (METROCREAM) 0.75 % cream [id number] Yes Apply topically 2 (two) times daily. [name], Karleen Hampshire, MD Taking Active   nitrofurantoin, macrocrystal-monohydrate, (MACROBID) 100 MG capsule [id number] Yes Take 100 mg by mouth daily. [provider] Taking Active   Omega-3 Fatty Acids (FISH OIL PO) [id number] Yes Take 1 capsule by mouth daily. [provider] Taking Active Self  pantoprazole (PROTONIX) 40 MG tablet  [id number] Yes TAKE ONE TABLET BY MOUTH DAILY [name], Karleen Hampshire, MD Taking Active   permethrin (ELIMITE) 5 % cream [id number] Yes APPLY PEA SIZED AMOUNT TO FACE ONCE A  DAY [provider] Taking Active   rosuvastatin (CRESTOR) 10 MG tablet [id number] Yes Take 1 tablet (10 mg total) by mouth daily. [name], MD Taking Active   traZODone (DESYREL) 50 MG tablet [id number] Yes TAKE ONE HALF (1/2) TO ONE TABLET BY MOUTH AT BEDTIME AS NEEDED FOR SLEEP. [name], Karleen Hampshire, MD Taking Active   Vibegron [facility]) 75 MG TABS [id number] Yes Take 1 tablet by mouth daily. [provider] Taking Active   warfarin (COUMADIN) 5 MG tablet [id number] Yes TAKE 1/2 TO 1 TABLET BY MOUTH DAILY AS DIRECTED BY COUMADIN CLINIC End, Christopher, MD Taking Active             Home Care and Equipment/Supplies: Were Home Health Services Ordered?: NA Any new equipment or medical supplies ordered?: NA  Functional Questionnaire: Do you need assistance with bathing/showering or dressing?: No Do you need assistance with meal preparation?: No Do you need assistance with eating?: No Do you have difficulty maintaining continence: No Do you need assistance with getting out of bed/getting out of a chair/moving?: No Do you have difficulty managing or taking your medications?: No  Follow up appointments reviewed: PCP Follow-up appointment confirmed?: No (declined) MD Provider Line Number:[phone] Given: Yes Specialist Hospital Follow-up appointment confirmed?: Yes Date of Specialist follow-up appointment?: 04/06/23 Follow-Up Specialty Provider:: Dr Fransico Michael Do you need transportation to your follow-up appointment?: No Do you understand care options if your condition(s) worsen?: Yes-patient verbalized understanding    SIGNATURE  Woodfin Ganja LPN [facility] Nurse Health Advisor Direct Dial [phone]

## 2023-03-30 NOTE — Transitions of Care (Post Inpatient/ED Visit) (Signed)
   03/30/2023  Name: [name] MRN: [id number] DOB: 1945-09-15  Today's TOC FU Call Status: Today's TOC FU Call Status:: Unsuccessful Call (1st Attempt) Unsuccessful Call (1st Attempt) Date: 03/30/23  Attempted to reach the patient regarding the most recent Inpatient/ED visit.  Follow Up Plan: Additional outreach attempts will be made to reach the patient to complete the Transitions of Care (Post Inpatient/ED visit) call.   Signature   Woodfin Ganja LPN [facility] Nurse Health Advisor Direct Dial [phone]

## 2023-04-05 ENCOUNTER — Ambulatory Visit: Payer: Medicare HMO | Attending: Internal Medicine

## 2023-04-05 DIAGNOSIS — I480 Paroxysmal atrial fibrillation: Secondary | ICD-10-CM | POA: Diagnosis not present

## 2023-04-05 DIAGNOSIS — Z5181 Encounter for therapeutic drug level monitoring: Secondary | ICD-10-CM

## 2023-04-05 LAB — POCT INR: INR: 2 (ref 2.0–3.0)

## 2023-04-05 NOTE — Patient Instructions (Signed)
CONTINUE 1 tablet daily except 1/2 tablet on Mondays and Fridays.  Repeat INR in 6 weeks [phone]

## 2023-04-05 NOTE — Progress Notes (Signed)
Cardiology Office Note:    Date:  04/07/2023   ID:  GRACEANNE PRIMACK, DOB 09-May-1946, MRN [id number]  PCP:  [name], MD  [facility] Cardiologist:  Yvonne Kendall, MD  [facility] Electrophysiologist:  None   Referring MD: [name], MD   Chief Complaint: ER follow-up  History of Present Illness:    [name] is a 77 y.o. 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   R/L HC in 2005 showed no significant CAD. TEE from 2016 showed EF 10-15% with diffuse hypokinesis. Nuclear stress test in 2016 showed no evidence of ischemia with an EF of 37%. Echo from 12/2014 showed an EF 25-30% with moderate MR. Over the years, EF has continued to improve with echo from 2022 showing EF 55-60%, no WMA, moderate LVH, normal RVSF, severely dilated left atrium, prior replacement of mitral valve with mild regurgitation.   Patient was seen in April 2024 reporting chest pain.  Cardiac CTA and echocardiogram ordered.  Echo showed LVEF 60 to 65%, no wall motion abnormalities, mild LVH, normal RV SF, mildly elevated pulmonary artery systolic pressure, repaired/replaced mitral valve, mild MR with a mean gradient of 3 mmHg, severely dilated left atrium, mild to moderate TR.  Cardiac CTA showed no coronary calcium.  Patient went to the ER on 03/28/2023 for chest pain, shortness of breath and palpitations.  She was found to be in A-fib RVR with heart rates up to 120s.  Blood pressure was mildly elevated.  Labs showed nonspecific leukocytosis.  BNP 209.  INR 2.1.  Chest x-ray showed cardiomegaly with no pulmonary edema.  CTA chest showed no PE.  Patient was recommended she double up  on her Coreg and she was sent home.  Today, the patient is in rate controlled A-fib with heart rates in the 70s.  She is overall feeling better, but not back to baseline.  Patient is taking warfarin for stroke prophylaxis.  She is also taking amiodarone.  She denies recurrent chest pain.  She does have some persistent shortness of breath.  She reports dependent lower leg edema.   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   a) [facility] Echo: EF ~10% Severe LV dilation & global LV Systolic dysfxn, restrictive filling pattern - Gr 3 DD, mod RV dysfxn, severe LA dilation - 6.4 cm, mod RA dil, mod pl effusion, mod MR, mild TR; b) TEE 10/07/14: EF ~10%, Severe LV dilation & dysfxn Mod RV dysfxn, LAA oversewn, Mod RA & TR   Diverticulosis    H/O: rheumatic fever  HYPERLIPIDEMIA 04/05/2010   Qualifier: Diagnosis of  By: Patsy Lager MD, Karleen Hampshire     Hypothyroid    Migraine headache    Osteopenia    Paroxysmal atrial fibrillation (HCC) 2005; Recurrent 09/2014   a) 2005: s/p Cox Maze & LAA ligation; b) 09/2014: TEE-cardioversion   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  Posterior MV leaflet is fixed  and immobile.  MAC.  reduced excursion of the anterior MV leaflet.  Mean MV gradient was 3mm Hg and MVA calculated at cm2.      Urinary incontinence     Past Surgical History:  Procedure Laterality Date   ABDOMINAL HYSTERECTOMY     CARDIAC CATHETERIZATION  Jan 2005   Pre-op R&LHC -- Nonobstructive CAD; normal PA pressures   CARDIOVERSION  06/04/2012   Procedure: CARDIOVERSION;  Surgeon: Luis Abed, MD;  Location: [facility] ENDOSCOPY;  Service: Cardiovascular;  Laterality: N/A;   CARDIOVERSION N/A 10/07/2014   Procedure: CARDIOVERSION;  Surgeon: Quintella Reichert, MD;  Location: [facility] ENDOSCOPY;  Service: Cardiovascular;  Laterality: N/A;   COLONOSCOPY WITH PROPOFOL N/A 07/11/2017   Procedure: COLONOSCOPY WITH PROPOFOL;  Surgeon: Toledo, Boykin Nearing, MD;  Location: [facility] ENDOSCOPY;  Service: Gastroenterology;  Laterality: N/A;   COLONOSCOPY WITH PROPOFOL N/A 05/17/2022   Procedure: COLONOSCOPY WITH PROPOFOL;  Surgeon: Midge Minium, MD;  Location: [facility] ENDOSCOPY;  Service: Endoscopy;  Laterality: N/A;   LUMBAR DISC SURGERY     x 2 distantly   MITRAL VALVE REPAIR  2005   with Cox Maze for [employer]   NM MYOVIEW LTD  4/7/'16   EF 37% with septal hypokinesis and diffuse hypokinesis. No ischemia or infarction. 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   RIGHT HEART CATHETERIZATION N/A 10/03/2014   Procedure: RIGHT HEART CATH;  Surgeon: Dolores Patty, MD;  Location: [facility] CATH LAB;  Service: Cardiovascular;  RAP , RVP 35/2/9 mmHg, PAP 45/12 mmHg, PCWP 16 mmHg; CO/I by Fick: 3.9/2.3; Ao/PA/SVC SaO2%: 97%/63%/65%.   TEE WITHOUT CARDIOVERSION N/A 10/07/2014   Procedure: TRANSESOPHAGEAL ECHOCARDIOGRAM (TEE);  Surgeon: Quintella Reichert, MD;  Location: [facility] ENDOSCOPY;  Service: Cardiovascular;  Laterality: N/A;   THYROIDECTOMY, PARTIAL     partial-no cancer; now on thyroid replacement   TRANSTHORACIC ECHOCARDIOGRAM  2013; 2/'16 & 5/'16   a) 2013: EF 60-65%, mild  MR, Gr 2 DD; b) 2/'16 @ [facility]: EF ~10% - severel global LV dysfunction (systolic & diastolic) - dilated LV & restrictive filling pattern - Gr 3 DD, mod reduced RV function, severely dilated LA - 6.4 cm, mod dilated RA, mod pl effusion, mod MR, mild TR; c) 5/10/'16:  EF 25-30%, mod LV dilation, no RWMA,Gr 3 DD w/ high LAP, MV sewing ring intact w/ Mod MR, Severe LA dilation   TRANSTHORACIC ECHOCARDIOGRAM  January 2017:   EF improved to 45-50%. Mild diffuse HK. Mild MR. Moderate LA dilation. Normal PA pressures.    Current Medications: Current Meds  Medication Sig   acetaminophen (TYLENOL) 500 MG tablet Take 1,000 mg by mouth every 8 (eight) hours as needed.   amiodarone (PACERONE) 200 MG tablet Take 2 tablets (400 mg total) by mouth 2 (two) times daily for 7 days, THEN 1 tablet (200 mg total) 2 (two) times daily for 7 days.  amiodarone (PACERONE) 200 MG tablet After 2 week taper completed then start Amiodarone 200 mg daily   BIOTIN PO Take 1 tablet by mouth daily.   Calcium Carbonate (CALCIUM 600 PO) Take 1 tablet by mouth daily.   Calcium Citrate-Vitamin D (CALCIUM + D PO) Take 1 capsule by mouth daily.   carvedilol (COREG) 3.125 MG tablet Take 1 tablet (3.125 mg total) by mouth 2 (two) times daily.   cetirizine (ZYRTEC) 10 MG tablet Take 10 mg by mouth daily.   Cholecalciferol (VITAMIN D-3) 125 MCG (5000 UT) TABS Take 1 tablet by mouth daily.   cyanocobalamin (VITAMIN B12) 1000 MCG tablet Take 1,000 mcg by mouth daily.   donepezil (ARICEPT) 5 MG tablet TAKE ONE TABLET BY MOUTH DAILY AT BEDTIME   fluticasone (FLONASE) 50 MCG/ACT nasal spray PLACE TWO SPRAYS INTO BOTH NOSTRILS DAILY   IRON, FERROUS SULFATE, PO Take 1 tablet by mouth daily.   levothyroxine (SYNTHROID) 75 MCG tablet TAKE ONE TAB BY MOUTH ONCE DAILY. TAKE ON AN EMPTY STOMACH WITH A GLASS OF WATER ATLEAST 30-60 MINUTES BEFORE BREAKFAST   losartan (COZAAR) 25 MG tablet TAKE 1 TABLET BY MOUTH ONCE DAILY   Melatonin 5 MG CAPS Take 1  capsule by mouth at bedtime.   metroNIDAZOLE (METROCREAM) 0.75 % cream Apply topically 2 (two) times daily.   nitrofurantoin, macrocrystal-monohydrate, (MACROBID) 100 MG capsule Take 100 mg by mouth daily.   Omega-3 Fatty Acids (FISH OIL PO) Take 1 capsule by mouth daily.   pantoprazole (PROTONIX) 40 MG tablet TAKE ONE TABLET BY MOUTH DAILY   permethrin (ELIMITE) 5 % cream APPLY PEA SIZED AMOUNT TO FACE ONCE A DAY   rosuvastatin (CRESTOR) 10 MG tablet Take 1 tablet (10 mg total) by mouth daily.   traZODone (DESYREL) 50 MG tablet TAKE ONE HALF (1/2) TO ONE TABLET BY MOUTH AT BEDTIME AS NEEDED FOR SLEEP.   Vibegron (GEMTESA) 75 MG TABS Take 1 tablet by mouth daily.   warfarin (COUMADIN) 5 MG tablet TAKE 1/2 TO 1 TABLET BY MOUTH DAILY AS DIRECTED BY COUMADIN CLINIC   [DISCONTINUED] amiodarone (PACERONE) 200 MG tablet Take 100 mg by mouth daily.     Allergies:   Patient has no known allergies.   Social History   Socioeconomic History   Marital status: Married    Spouse name: Not on file   Number of children: 2   Years of education: Not on file   Highest education level: Not on file  Occupational History   Occupation: Retired  Tobacco Use   Smoking status: Never   Smokeless tobacco: Never  Vaping Use   Vaping status: Never Used  Substance and Sexual Activity   Alcohol use: Not Currently   Drug use: No   Sexual activity: Yes  Other Topics Concern   Not on file  Social History Narrative   Not on file   Social Determinants of Health   Financial Resource Strain: Low Risk  (08/15/2022)   Overall Financial Resource Strain (CARDIA)    Difficulty of Paying Living Expenses: Not hard at all  Food Insecurity: No Food Insecurity (08/15/2022)   Hunger Vital Sign    Worried About Running Out of Food in the Last Year: Never true    Ran Out of Food in the Last Year: Never true  Transportation Needs: No Transportation Needs (08/15/2022)   PRAPARE - Administrator, Civil Service  (Medical): No    Lack of Transportation (Non-Medical): No  Physical Activity: Inactive (  08/14/2020)   Exercise Vital Sign    Days of Exercise per Week: 0 days    Minutes of Exercise per Session: 0 min  Stress: No Stress Concern Present (08/15/2022)   [employer] of Occupational Health - Occupational Stress Questionnaire    Feeling of Stress : Not at all  Social Connections: Unknown (08/15/2022)   Social Connection and Isolation Panel [NHANES]    Frequency of Communication with Friends and Family: Not on file    Frequency of Social Gatherings with Friends and Family: Not on file    Attends Religious Services: Not on file    Active Member of Clubs or Organizations: Not on file    Attends Banker Meetings: Not on file    Marital Status: Married     Family History: The patient's family history includes Coronary artery disease in her mother; Diabetes in her mother; Kidney disease in her mother. There is no history of Breast cancer.  ROS:   Please see the history of present illness.     All other systems reviewed and are negative.  EKGs/Labs/Other Studies Reviewed:    The following studies were reviewed today:  Echo 12/2022 1. Left ventricular ejection fraction, by estimation, is 60 to 65%. The  left ventricle has normal function. The left ventricle has no regional  wall motion abnormalities. There is mild left ventricular hypertrophy.  Left ventricular diastolic parameters  are indeterminate. The average left ventricular global longitudinal strain  is -13.0 %.   2. Right ventricular systolic function is normal. The right ventricular  size is normal. There is mildly elevated pulmonary artery systolic  pressure. The estimated right ventricular systolic pressure is 42.2 mmHg.   3. The mitral valve has been repaired/replaced. Mild mitral valve  regurgitation. No evidence of mitral stenosis. mean gradient 3 mm Hg s/p  prostetic annuloplasty ring   4. Left atrial  size was severely dilated.   5. Tricuspid valve regurgitation is mild to moderate.   6. The aortic valve is tricuspid. Aortic valve regurgitation is not  visualized. No aortic stenosis is present.   7. The inferior vena cava is normal in size with greater than 50%  respiratory variability, suggesting right atrial pressure of 3 mmHg.   Comparison(s): 10/20/20: 55-60%, mild LVH, LAE, MV gradient  Hx MVR with prostetic annuloplasty ring.   Cardiac CTA 11/2022   IMPRESSION: 1. Normal coronary calcium score of 0.   2. Normal coronary origin with right dominance.   3. No evidence of CAD.   4. CAD-RADS 0. No evidence of CAD (0%). Consider non-atherosclerotic causes of chest pain.   Electronically Signed: By: Debbe Odea M.D. On: 12/22/2022 17:42    EKG:  EKG is ordered today.  The ekg ordered today demonstrates atrial fibrillation, 82 bpm, subtle ST depression lateral leads  Recent Labs: 02/22/2023: ALT 26 03/28/2023: B Natriuretic Peptide 209.8; BUN 20; Creatinine, Ser 0.89; Hemoglobin 12.2; Platelets 206; Potassium 4.0; Sodium 140; TSH 0.615  Recent Lipid Panel    Component Value Date/Time   CHOL 125 02/22/2023 0933   CHOL 123 09/30/2014 1650   TRIG 130 02/22/2023 0933   TRIG 93 09/30/2014 1650   HDL 47 02/22/2023 0933   HDL 53 09/30/2014 1650   CHOLHDL 2.7 02/22/2023 0933   VLDL 26 02/22/2023 0933   VLDL 19 09/30/2014 1650   LDLCALC 52 02/22/2023 0933   LDLCALC 51 09/30/2014 1650   LDLDIRECT 132.3 03/31/2010 0906   Physical Exam:  VS:  BP 116/68 (BP Location: Left Arm, Patient Position: Sitting, Cuff Size: Normal)   Pulse 82   Ht 5' 7.5" (1.715 m)   Wt 173 lb 3.2 oz (78.6 kg)   SpO2 97%   BMI 26.73 kg/m     Wt Readings from Last 3 Encounters:  04/06/23 173 lb 3.2 oz (78.6 kg)  03/28/23 176 lb 5.9 oz (80 kg)  03/27/23 176 lb 6 oz (80 kg)     GEN:  Well nourished, well developed in no acute distress HEENT: Normal NECK: No JVD; No carotid  bruits LYMPHATICS: No lymphadenopathy CARDIAC: Irreg Irreg, no murmurs, rubs, gallops RESPIRATORY:  Clear to auscultation without rales, wheezing or rhonchi  ABDOMEN: Soft, non-tender, non-distended MUSCULOSKELETAL:  No edema; No deformity  SKIN: Warm and dry NEUROLOGIC:  Alert and oriented x 3 PSYCHIATRIC:  Normal affect   ASSESSMENT:    1. PAF (paroxysmal atrial fibrillation) (HCC)   2. NICM (nonischemic cardiomyopathy) (HCC)   3. 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, status post MVR   4. Aortic atherosclerosis (HCC)   5. Hyperlipidemia, mixed   6. Chest pain of uncertain etiology    PLAN:    In order of problems listed above:  PAroxysmal Afib with prior MAZE procedure and cardioversions x3 Patient has complicated A-fib history as described in the H&P.  Patient has been on amiodarone in Coreg and remained in normal sinus rhythm for some time.  Recent ER visit noted to be in A-fib RVR and Coreg was doubled.  Patient is in rate controlled A-fib today with heart rates in the 70s.  She is overall feeling okay.  I will reload amiodarone 400 mg twice daily x 1 week, 200 mg twice daily x 1 week, 200 mg daily thereafter.  We will continue Coreg 3.125 mg twice daily.  I will refer patient to EP for further recommendations.  Continue warfarin for stroke prophylaxis.  Most recent INR was 2.  NICM MR s/p MVR Echo in May 2024 showed LVEF 60 to 65%, mild LVH, mildly elevated pulmonary artery systolic pressure, repaired mitral valve, mild MR, no stenosis, mean gradient 3 mmHg, severely dilated left atrium, mild to moderate TR. patient reports dependent edema.  She takes Lasix 20 mg daily.  Continue losartan 25 mg daily and Coreg 3.125 mg daily.  Aortic atherosclerosis/HLD LDL 52, HDL 47, triglycerides 130, total cholesterol 125.  Continue Crestor 10 mg daily.  Chest pain Patient reported mild chest pain in the setting of rapid A-fib.  Recent cardiac CTA showed no CAD, which is  overall reassuring.  High-sensitivity troponin in the ER was negative.  No further ischemic workup at this time.  Disposition: Follow up in 2-3 month(s) with MD/APP    Signed,  David Stall, PA-C  04/07/2023 1:33 PM    [facility]

## 2023-04-06 ENCOUNTER — Encounter: Payer: Self-pay | Admitting: Medical

## 2023-04-06 ENCOUNTER — Ambulatory Visit: Payer: Medicare HMO | Attending: Medical | Admitting: Medical

## 2023-04-06 VITALS — BP 116/68 | HR 82 | Ht 67.5 in | Wt 173.2 lb

## 2023-04-06 DIAGNOSIS — I480 Paroxysmal atrial fibrillation: Secondary | ICD-10-CM | POA: Diagnosis not present

## 2023-04-06 DIAGNOSIS — I428 Other cardiomyopathies: Secondary | ICD-10-CM

## 2023-04-06 DIAGNOSIS — I700 Atherosclerosis of aorta: Secondary | ICD-10-CM | POA: Diagnosis not present

## 2023-04-06 DIAGNOSIS — E782 Mixed hyperlipidemia: Secondary | ICD-10-CM

## 2023-04-06 DIAGNOSIS — I058 Other rheumatic mitral valve diseases: Secondary | ICD-10-CM | POA: Diagnosis not present

## 2023-04-06 DIAGNOSIS — R079 Chest pain, unspecified: Secondary | ICD-10-CM

## 2023-04-06 MED ORDER — AMIODARONE HCL 200 MG PO TABS
ORAL_TABLET | ORAL | 0 refills | Status: DC
Start: 2023-04-06 — End: 2023-10-02

## 2023-04-06 MED ORDER — AMIODARONE HCL 200 MG PO TABS
ORAL_TABLET | ORAL | 3 refills | Status: DC
Start: 2023-04-06 — End: 2023-10-02

## 2023-04-06 NOTE — Patient Instructions (Addendum)
Medication Instructions:  Your physician has recommended you make the following change in your medication:  Amiodarone 400 mg twice a day for 1 week Amiodarone 200 mg twice a day for 1 week Amiodarone 200 mg once a day - everyday  *If you need a refill on your cardiac medications before your next appointment, please call your pharmacy*   Lab Work: NONE  If you have labs (blood work) drawn today and your tests are completely normal, you will receive your results only by: MyChart Message (if you have MyChart) OR A paper copy in the mail If you have any lab test that is abnormal or we need to change your treatment, we will call you to review the results.   Testing/Procedures:  You have been referred to Electrophysiology    Follow-Up: At [facility], you and your health needs are our priority.  As part of our continuing mission to provide you with exceptional heart care, we have created designated Provider Care Teams.  These Care Teams include your primary Cardiologist (physician) and Advanced Practice Providers (APPs -  Physician Assistants and Nurse Practitioners) who all work together to provide you with the care you need, when you need it.  We recommend signing up for the patient portal called "MyChart".  Sign up information is provided on this After Visit Summary.  MyChart is used to connect with patients for Virtual Visits (Telemedicine).  Patients are able to view lab/test results, encounter notes, upcoming appointments, etc.  Non-urgent messages can be sent to your provider as well.   To learn more about what you can do with MyChart, go to [email or web address].    Your next appointment:   2-3 month(s)  Provider:   You may see Yvonne Kendall, MD or one of the following Advanced Practice Providers on your designated Care Team:    Cadence [place], [place]

## 2023-04-17 ENCOUNTER — Ambulatory Visit: Payer: Medicare HMO | Admitting: Physician Assistant

## 2023-04-28 ENCOUNTER — Encounter: Payer: Self-pay | Admitting: Cardiology

## 2023-04-28 ENCOUNTER — Ambulatory Visit: Payer: Medicare HMO | Attending: Cardiology | Admitting: Cardiology

## 2023-04-28 VITALS — BP 120/60 | HR 63 | Ht 67.5 in | Wt 172.2 lb

## 2023-04-28 DIAGNOSIS — I5022 Chronic systolic (congestive) heart failure: Secondary | ICD-10-CM | POA: Diagnosis not present

## 2023-04-28 DIAGNOSIS — Z79899 Other long term (current) drug therapy: Secondary | ICD-10-CM

## 2023-04-28 DIAGNOSIS — I4819 Other persistent atrial fibrillation: Secondary | ICD-10-CM | POA: Diagnosis not present

## 2023-04-28 DIAGNOSIS — I059 Rheumatic mitral valve disease, unspecified: Secondary | ICD-10-CM | POA: Diagnosis not present

## 2023-04-28 NOTE — Addendum Note (Signed)
Addended by: Darene Lamer T on: 04/28/2023 10:24 AM   Modules accepted: Orders

## 2023-04-28 NOTE — Progress Notes (Signed)
Electrophysiology Office Note:    Date:  04/28/2023   ID:  [name], [name] 1946-08-16, MRN [id number]  [facility] Cardiologist:  Yvonne Kendall, MD  [facility] Electrophysiologist:  Lanier Prude, MD   Referring MD: Marianne Sofia, PA-C   Chief Complaint: Atrial fibrillation  History of Present Illness:    [name] is a 77 y.o. femalewho I am seeing today for an evaluation of atrial fibrillation at the request of Cadence [place], [place].  The patient was last seen by Cadence on April 06, 2023.  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  Her EF has been as low as 10% in 2016.  More recently, the EF was 55 to 60% in 2022.  The left atrium is severely dilated on that echo.  She presented to the emergency department March 28, 2023 with chest pain, shortness of breath and palpitations.  She was found to be in A-fib with heart rates in the 120s.  Her Coreg was increased and she was sent home.  When she saw Cadence in clinic on August 8, she was still in atrial fibrillation with more controlled rates.  She continues to take warfarin for stroke prophylaxis.  She is also taking amiodarone.  She tells me she can sense when she is out of rhythm with palpitations.  She is working with her urologist regarding medications for urinary incontinence.      Their past medical, social and family history was reveiwed.   ROS:   Please see the history of present illness.    All other systems reviewed and are negative.  EKGs/Labs/Other Studies Reviewed:    The following studies were reviewed today:  Jan 03, 2023 echo personally reviewed EF 60% RV function normal Severely dilated left atrium Moderate TR        Physical Exam:    VS:  BP 120/60   Pulse 63   Ht 5' 7.5" (1.715 m)    Wt 172 lb 3.2 oz (78.1 kg)   SpO2 99%   BMI 26.57 kg/m     Wt Readings from Last 3 Encounters:  04/28/23 172 lb 3.2 oz (78.1 kg)  04/06/23 173 lb 3.2 oz (78.6 kg)  03/28/23 176 lb 5.9 oz (80 kg)     GEN:  Well nourished, well developed in no acute distress CARDIAC: Irregularly irregular, no murmurs, rubs, gallops RESPIRATORY:  Clear to auscultation without rales, wheezing or rhonchi       ASSESSMENT AND PLAN:    1. Persistent atrial fibrillation (HCC)   2. Encounter for long-term (current) use of high-risk medication   3. Chronic systolic heart failure (HCC)   4. Rheumatic mitral valve disease     #Persistent atrial fibrillation #High risk drug monitoring-amiodarone Continue amiodarone 200 mg by mouth daily Rhythm control may be difficult for her given the severity of her left atrial dilation.  She is not a candidate for catheter ablation given the degree of LA dilation.  Given that she has recently completed a many reload of amiodarone, favor another trial of sinus rhythm with cardioversion.  I discussed the cardioversion procedure in detail with the patient including the risks and she wishes to proceed.  I will have her come back to clinic in 3 months for a visit with the APP.  At that appointment she can get a CMP, TSH and free T4 for  amiodarone monitoring.  If she returns to atrial fibrillation after this cardioversion, would deem her atrial fibrillation/flutter to be permanent.  Continue warfarin for stroke prophylaxis   #Chronic systolic heart failure #Valvular heart disease Post mitral valve repair/maze/left atrial appendage ligation.  Severe left atrial dilation. EF has recovered.  NYHA class II.  Continue Coreg, Lasix, losartan  Follow-up 3 months with APP.  Signed, Rossie Muskrat. Lalla Brothers, MD, [facility], [facility] 04/28/2023 9:29 AM    Electrophysiology [facility]

## 2023-04-28 NOTE — H&P (View-Only) (Signed)
Electrophysiology Office Note:    Date:  04/28/2023   ID:  [name], [name] 08-09-1946, MRN [id number]  [facility] Cardiologist:  Yvonne Kendall, MD  [facility] Electrophysiologist:  Lanier Prude, MD   Referring MD: Marianne Sofia, PA-C   Chief Complaint: Atrial fibrillation  History of Present Illness:    [name] is a 77 y.o. femalewho I am seeing today for an evaluation of atrial fibrillation at the request of Cadence Ballou, [place].  The patient was last seen by Cadence on April 06, 2023.  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  Her EF has been as low as 10% in 2016.  More recently, the EF was 55 to 60% in 2022.  The left atrium is severely dilated on that echo.  She presented to the emergency department March 28, 2023 with chest pain, shortness of breath and palpitations.  She was found to be in A-fib with heart rates in the 120s.  Her Coreg was increased and she was sent home.  When she saw Cadence in clinic on August 8, she was still in atrial fibrillation with more controlled rates.  She continues to take warfarin for stroke prophylaxis.  She is also taking amiodarone.  She tells me she can sense when she is out of rhythm with palpitations.  She is working with her urologist regarding medications for urinary incontinence.      Their past medical, social and family history was reveiwed.   ROS:   Please see the history of present illness.    All other systems reviewed and are negative.  EKGs/Labs/Other Studies Reviewed:    The following studies were reviewed today:  Jan 03, 2023 echo personally reviewed EF 60% RV function normal Severely dilated left atrium Moderate TR        Physical Exam:    VS:  BP 120/60   Pulse 63   Ht 5' 7.5" (1.715 m)    Wt 172 lb 3.2 oz (78.1 kg)   SpO2 99%   BMI 26.57 kg/m     Wt Readings from Last 3 Encounters:  04/28/23 172 lb 3.2 oz (78.1 kg)  04/06/23 173 lb 3.2 oz (78.6 kg)  03/28/23 176 lb 5.9 oz (80 kg)     GEN:  Well nourished, well developed in no acute distress CARDIAC: Irregularly irregular, no murmurs, rubs, gallops RESPIRATORY:  Clear to auscultation without rales, wheezing or rhonchi       ASSESSMENT AND PLAN:    1. Persistent atrial fibrillation (HCC)   2. Encounter for long-term (current) use of high-risk medication   3. Chronic systolic heart failure (HCC)   4. Rheumatic mitral valve disease     #Persistent atrial fibrillation #High risk drug monitoring-amiodarone Continue amiodarone 200 mg by mouth daily Rhythm control may be difficult for her given the severity of her left atrial dilation.  She is not a candidate for catheter ablation given the degree of LA dilation.  Given that she has recently completed a many reload of amiodarone, favor another trial of sinus rhythm with cardioversion.  I discussed the cardioversion procedure in detail with the patient including the risks and she wishes to proceed.  I will have her come back to clinic in 3 months for a visit with the APP.  At that appointment she can get a CMP, TSH and free T4 for  amiodarone monitoring.  If she returns to atrial fibrillation after this cardioversion, would deem her atrial fibrillation/flutter to be permanent.  Continue warfarin for stroke prophylaxis   #Chronic systolic heart failure #Valvular heart disease Post mitral valve repair/maze/left atrial appendage ligation.  Severe left atrial dilation. EF has recovered.  NYHA class II.  Continue Coreg, Lasix, losartan  Follow-up 3 months with APP.  Signed, Rossie Muskrat. Lalla Brothers, MD, [facility], [facility] 04/28/2023 9:29 AM    Electrophysiology [facility]

## 2023-04-28 NOTE — Patient Instructions (Addendum)
Medication Instructions:  The current medical regimen is effective;  continue present plan and medications.  *If you need a refill on your cardiac medications before your next appointment, please call your pharmacy*   Lab Work: CBC, BMET, PT/INR 3 days before procedure (no lab appointment needed)  CMET, TSH, FREE T4- in 3 months (no lab appointment needed)    If you have labs (blood work) drawn today and your tests are completely normal, you will receive your results only by: MyChart Message (if you have MyChart) OR A paper copy in the mail If you have any lab test that is abnormal or we need to change your treatment, we will call you to review the results.   Testing/Procedures: Your physician has requested that you have a Cardioversion. Electrical Cardioversion uses a jolt of electricity to your heart either through paddles or wired patches attached to your chest. This is a controlled, usually prescheduled, procedure. This procedure is done at the hospital and you are not awake during the procedure. You usually go home the day of the procedure. Please see the instruction sheet given to you today for more information.   Follow-Up: At [facility], you and your health needs are our priority.  As part of our continuing mission to provide you with exceptional heart care, we have created designated Provider Care Teams.  These Care Teams include your primary Cardiologist (physician) and Advanced Practice Providers (APPs -  Physician Assistants and Nurse Practitioners) who all work together to provide you with the care you need, when you need it.  We recommend signing up for the patient portal called "MyChart".  Sign up information is provided on this After Visit Summary.  MyChart is used to connect with patients for Virtual Visits (Telemedicine).  Patients are able to view lab/test results, encounter notes, upcoming appointments, etc.  Non-urgent messages can be sent to your provider as  well.   To learn more about what you can do with MyChart, go to [email or web address].    Your next appointment:   3 month(s)  Provider:   Sherie Don, NP    Other Instructions     Dear [name]  You are scheduled for a Cardioversion on Friday, September 13 with Dr. Azucena Cecil.  Please arrive at the [facility] Entrance of [facility], [address], [place] [address] at 11:30 AM  (This is 1 hour(s) prior to your procedure time).  Proceed to the Check-In Desk directly inside the entrance.  Procedure Parking: Use the entrance off of the [address] side of the hospital. Turn right upon entering and follow the driveway to parking that is directly in front of the [facility] Center. There is no valet parking available at this entrance, however there is an awning directly in front of the [facility]ascular Center for drop off/ pick up for patients.   DIET:  Nothing to eat or drink after midnight except a sip of water with medications (see medication instructions below)  MEDICATION INSTRUCTIONS: !!IF ANY NEW MEDICATIONS ARE STARTED AFTER TODAY, PLEASE NOTIFY YOUR PROVIDER AS SOON AS POSSIBLE!!  FYI: Medications such as Semaglutide (Ozempic, [place]), Tirzepatide (Mounjaro, Zepbound), Dulaglutide (Trulicity), etc ("GLP1 agonists") AND Canagliflozin (Invokana), Dapagliflozin (Farxiga), Empagliflozin (Jardiance), Ertugliflozin (Steglatro), Bexagliflozin [employer]) or any combination with one of these drugs such as Invokamet (Canagliflozin/Metformin), Synjardy (Empagliflozin/Metformin), etc ("SGLT2 inhibitors") must be held around the time of a procedure. This is not a comprehensive list of all of these drugs.  Please review all of your medications and talk to your provider if you take any one of these. If you are not sure, ask your provider.         :1}Continue taking your anticoagulant (blood thinner): Warfarin (Coumadin).  You will need to continue this after your  procedure until you are told by your provider that it is safe to stop.    LABS: PT/INR, CBC, BMET 3 days before procedure.   FYI:  For your safety, and to allow [place] to monitor your vital signs accurately during the surgery/procedure we request: If you have artificial nails, gel coating, SNS etc, please have those removed prior to your surgery/procedure. Not having the nail coverings /polish removed may result in cancellation or delay of your surgery/procedure.  You must have a responsible person to drive you home and stay in the waiting area during your procedure. Failure to do so could result in cancellation.  Bring your insurance cards.  *Special Note: Every effort is made to have your procedure done on time. Occasionally there are emergencies that occur at the hospital that may cause delays. Please be patient if a delay does occur.

## 2023-05-08 ENCOUNTER — Other Ambulatory Visit: Payer: Self-pay | Admitting: Internal Medicine

## 2023-05-08 DIAGNOSIS — I4819 Other persistent atrial fibrillation: Secondary | ICD-10-CM

## 2023-05-08 NOTE — Telephone Encounter (Signed)
Prescription refill request received for warfarin Lov: 04/28/23 Lalla Brothers)  Next INR check: 05/17/23 Warfarin tablet strength: 5mg   Appropriate dose. Refill sent.

## 2023-05-10 LAB — BASIC METABOLIC PANEL
BUN/Creatinine Ratio: 19 (ref 12–28)
BUN: 16 mg/dL (ref 8–27)
CO2: 23 mmol/L (ref 20–29)
Calcium: 8.7 mg/dL (ref 8.7–10.3)
Chloride: 106 mmol/L (ref 96–106)
Creatinine, Ser: 0.83 mg/dL (ref 0.57–1.00)
Glucose: 90 mg/dL (ref 70–99)
Potassium: 4.2 mmol/L (ref 3.5–5.2)
Sodium: 141 mmol/L (ref 134–144)
eGFR: 72 mL/min/{1.73_m2} (ref >59)

## 2023-05-10 LAB — CBC
Hematocrit: 39.7 % (ref 34.0–46.6)
Hemoglobin: 12.8 g/dL (ref 11.1–15.9)
MCH: 32.8 pg (ref 26.6–33.0)
MCHC: 32.2 g/dL (ref 31.5–35.7)
MCV: 102 fL — ABNORMAL HIGH (ref 79–97)
Platelets: 181 10*3/uL (ref 150–450)
RBC: 3.9 x10E6/uL (ref 3.77–5.28)
RDW: 13.4 % (ref 11.7–15.4)
WBC: 4.6 10*3/uL (ref 3.4–10.8)

## 2023-05-10 LAB — PROTIME-INR
INR: 3 — ABNORMAL HIGH (ref 0.9–1.2)
Prothrombin Time: 30.9 s — ABNORMAL HIGH (ref 9.1–12.0)

## 2023-05-11 ENCOUNTER — Telehealth: Payer: Self-pay | Admitting: *Deleted

## 2023-05-11 MED ORDER — SODIUM CHLORIDE 0.9 % IV SOLN: INTRAVENOUS | Status: DC

## 2023-05-11 NOTE — Telephone Encounter (Signed)
Spoke with patient and reviewed instructions for her procedure scheduled for tomorrow. Reviewed date, time, location, and instructions. She verbalized understanding with no further questions at this time.

## 2023-05-12 ENCOUNTER — Ambulatory Visit
Admission: RE | Admit: 2023-05-12 | Discharge: 2023-05-12 | Disposition: A | Discharge status: Home / Self Care | Payer: Medicare HMO | Attending: Cardiology | Admitting: Cardiology

## 2023-05-12 ENCOUNTER — Encounter
Admission: RE | Disposition: A | Discharge status: Home / Self Care | Payer: Self-pay | Source: Home / Self Care | Attending: Cardiology

## 2023-05-12 ENCOUNTER — Ambulatory Visit: Payer: Medicare HMO | Admitting: Certified Registered Nurse Anesthetist

## 2023-05-12 ENCOUNTER — Other Ambulatory Visit: Payer: Self-pay

## 2023-05-12 ENCOUNTER — Encounter: Payer: Self-pay | Admitting: Cardiology

## 2023-05-12 DIAGNOSIS — I110 Hypertensive heart disease with heart failure: Secondary | ICD-10-CM | POA: Diagnosis not present

## 2023-05-12 DIAGNOSIS — Z7901 Long term (current) use of anticoagulants: Secondary | ICD-10-CM | POA: Diagnosis not present

## 2023-05-12 DIAGNOSIS — Z952 Presence of prosthetic heart valve: Secondary | ICD-10-CM | POA: Insufficient documentation

## 2023-05-12 DIAGNOSIS — M1990 Unspecified osteoarthritis, unspecified site: Secondary | ICD-10-CM | POA: Insufficient documentation

## 2023-05-12 DIAGNOSIS — I058 Other rheumatic mitral valve diseases: Secondary | ICD-10-CM | POA: Diagnosis not present

## 2023-05-12 DIAGNOSIS — Z79899 Other long term (current) drug therapy: Secondary | ICD-10-CM | POA: Diagnosis not present

## 2023-05-12 DIAGNOSIS — Z01818 Encounter for other preprocedural examination: Secondary | ICD-10-CM

## 2023-05-12 DIAGNOSIS — I4819 Other persistent atrial fibrillation: Secondary | ICD-10-CM | POA: Diagnosis not present

## 2023-05-12 DIAGNOSIS — I5022 Chronic systolic (congestive) heart failure: Secondary | ICD-10-CM | POA: Insufficient documentation

## 2023-05-12 HISTORY — PX: CARDIOVERSION: SHX1299

## 2023-05-12 LAB — PROTIME-INR
INR: 2.6 — ABNORMAL HIGH (ref 0.8–1.2)
Prothrombin Time: 27.9 s — ABNORMAL HIGH (ref 11.4–15.2)

## 2023-05-12 SURGERY — CARDIOVERSION
Anesthesia: General

## 2023-05-12 MED ORDER — PROPOFOL 10 MG/ML IV BOLUS
INTRAVENOUS | Status: DC | PRN
  Administered 2023-05-12: 20 mg via INTRAVENOUS
  Administered 2023-05-12: 60 mg via INTRAVENOUS

## 2023-05-12 MED ORDER — LIDOCAINE HCL (PF) 2 % IJ SOLN
INTRAMUSCULAR | Status: AC
  Filled 2023-05-12: qty 5

## 2023-05-12 MED ORDER — LIDOCAINE HCL (CARDIAC) PF 100 MG/5ML IV SOSY
PREFILLED_SYRINGE | INTRAVENOUS | Status: DC | PRN
  Administered 2023-05-12: 60 mg via INTRAVENOUS

## 2023-05-12 MED ORDER — PROPOFOL 10 MG/ML IV BOLUS
INTRAVENOUS | Status: AC
  Filled 2023-05-12: qty 20

## 2023-05-12 NOTE — Anesthesia Procedure Notes (Signed)
Procedure Name: MAC Date/Time: 05/12/2023 12:17 PM  Performed by: Lily Lovings, CRNAPre-anesthesia Checklist: Timeout performed, Patient being monitored, Suction available, Emergency Drugs available and Patient identified Patient Re-evaluated:Patient Re-evaluated prior to induction Oxygen Delivery Method: Nasal cannula Preoxygenation: Pre-oxygenation with 100% oxygen Induction Type: IV induction

## 2023-05-12 NOTE — Anesthesia Preprocedure Evaluation (Addendum)
Anesthesia Evaluation  Patient identified by MRN, date of birth, ID band Patient awake    Reviewed: Allergy & Precautions, NPO status , Patient's Chart, lab work & pertinent test results  History of Anesthesia Complications Negative for: history of anesthetic complications  Airway Mallampati: I       Dental no notable dental hx.    Pulmonary neg pulmonary ROS   Pulmonary exam normal breath sounds clear to auscultation       Cardiovascular hypertension, +CHF (NICM)  + dysrhythmias (a fib on warfarin) + Valvular Problems/Murmurs (s/p MVR; severe LA dilation)  Rhythm:Irregular Rate:Normal     Neuro/Psych  Headaches PSYCHIATRIC DISORDERS Anxiety Depression   Dementia    GI/Hepatic ,GERD  ,,  Endo/Other  Hypothyroidism    Renal/GU negative Renal ROS     Musculoskeletal  (+) Arthritis ,    Abdominal   Peds  Hematology negative hematology ROS (+)   Anesthesia Other Findings Cardiology note 04/28/23:  1. Persistent atrial fibrillation (HCC)  2. Encounter for long-term (current) use of high-risk medication  3. Chronic systolic heart failure (HCC)  4. Rheumatic mitral valve disease    #Persistent atrial fibrillation #High risk drug monitoring-amiodarone Continue amiodarone 200 mg by mouth daily Rhythm control may be difficult for her given the severity of her left atrial dilation.  She is not a candidate for catheter ablation given the degree of LA dilation.   Given that she has recently completed a many reload of amiodarone, favor another trial of sinus rhythm with cardioversion.  I discussed the cardioversion procedure in detail with the patient including the risks and she wishes to proceed.   I will have her come back to clinic in 3 months for a visit with the APP.  At that appointment she can get a CMP, TSH and free T4 for amiodarone monitoring.  If she returns to atrial fibrillation after this cardioversion, would  deem her atrial fibrillation/flutter to be permanent.   Continue warfarin for stroke prophylaxis   #Chronic systolic heart failure #Valvular heart disease Post mitral valve repair/maze/left atrial appendage ligation.  Severe left atrial dilation. EF has recovered.  NYHA class II.  Continue Coreg, Lasix, losartan   Follow-up 3 months with APP.   Reproductive/Obstetrics                             Anesthesia Physical Anesthesia Plan  ASA: 3  Anesthesia Plan: General   Post-op Pain Management:    Induction: Intravenous  PONV Risk Score and Plan: 3 and Propofol infusion, TIVA and Treatment may vary due to age or medical condition  Airway Management Planned: Natural Airway  Additional Equipment:   Intra-op Plan:   Post-operative Plan:   Informed Consent: I have reviewed the patients History and Physical, chart, labs and discussed the procedure including the risks, benefits and alternatives for the proposed anesthesia with the patient or authorized representative who has indicated his/her understanding and acceptance.       Plan Discussed with: CRNA  Anesthesia Plan Comments: (LMA/GETA backup discussed.  Patient consented for risks of anesthesia including but not limited to:  - adverse reactions to medications - damage to eyes, teeth, lips or other oral mucosa - nerve damage due to positioning  - sore throat or hoarseness - damage to heart, brain, nerves, lungs, other parts of body or loss of life  Informed patient about role of CRNA in peri- and intra-operative care.  Patient voiced  understanding.)        Anesthesia Quick Evaluation

## 2023-05-12 NOTE — Transfer of Care (Signed)
Immediate Anesthesia Transfer of Care Note  Patient: [name]  Procedure(s) Performed: CARDIOVERSION  Patient Location: PACU and Short Stay  Anesthesia Type:General  Level of Consciousness: drowsy and patient cooperative  Airway & Oxygen Therapy: Patient Spontanous Breathing and Patient connected to nasal cannula oxygen  Post-op Assessment: Report given to RN and Patient moving all extremities  Post vital signs: Reviewed and stable  Last Vitals:  Vitals Value Taken Time  BP 123/65 05/12/23 1225  Temp 36.6 C 05/12/23 1225  Pulse 48 05/12/23 1225  Resp 14 05/12/23 1225  SpO2 96 % 05/12/23 1225    Last Pain:  Vitals:   05/12/23 1225  TempSrc: Tympanic  PainSc: 0-No pain         Complications: No notable events documented.

## 2023-05-12 NOTE — Procedures (Signed)
Cardioversion procedure note For atrial fibrillation.  Procedure Details:  Consent: Risks of procedure as well as the alternatives and risks of each were explained to the (patient/caregiver).  Consent for procedure obtained.  Time Out: Verified patient identification, verified procedure, site/side was marked, verified correct patient position, special equipment/implants available, medications/allergies/relevent history reviewed, required imaging and test results available.  Performed  Patient placed on cardiac monitor, pulse oximetry, supplemental oxygen as necessary.   Sedation given: propofol IV, per anesthesia team Pacer pads placed anterior and posterior chest.   Cardioverted 1 time(s).   Cardioverted at  200J. Synchronized biphasic Converted to NSR   Evaluation: Findings: Post procedure EKG shows: NSR Complications: None Patient did tolerate procedure well.  Time Spent Directly with the Patient:  25 minutes   Debbe Odea, M.D.

## 2023-05-12 NOTE — Interval H&P Note (Signed)
History and Physical Interval Note:  05/12/2023 12:22 PM  [name]  has presented today for surgery, with the diagnosis of persistent  Afib.  The various methods of treatment have been discussed with the patient and family. After consideration of risks, benefits and other options for treatment, the patient has consented to  Procedure(s): CARDIOVERSION (N/A) as a surgical intervention.  The patient's history has been reviewed, patient examined, no change in status, stable for surgery.  I have reviewed the patient's chart and labs.  Questions were answered to the patient's satisfaction.     Arlys John Agbor-Etang

## 2023-05-12 NOTE — Anesthesia Postprocedure Evaluation (Signed)
Anesthesia Post Note  Patient: [name]  Procedure(s) Performed: CARDIOVERSION  Patient location during evaluation: PACU Anesthesia Type: General Level of consciousness: awake and alert, oriented and patient cooperative Pain management: pain level controlled Vital Signs Assessment: post-procedure vital signs reviewed and stable Respiratory status: spontaneous breathing, nonlabored ventilation and respiratory function stable Cardiovascular status: blood pressure returned to baseline and stable Postop Assessment: adequate PO intake Anesthetic complications: no   No notable events documented.   Last Vitals:  Vitals:   05/12/23 1229 05/12/23 1230  BP:  111/69  Pulse: (!) 47 (!) 48  Resp: 17 14  Temp:    SpO2: 97% 99%    Last Pain:  Vitals:   05/12/23 1230  TempSrc:   PainSc: 0-No pain                 Reed Breech

## 2023-05-15 ENCOUNTER — Encounter: Payer: Self-pay | Admitting: Cardiology

## 2023-06-14 ENCOUNTER — Ambulatory Visit: Payer: Medicare HMO | Attending: Internal Medicine

## 2023-06-14 DIAGNOSIS — I480 Paroxysmal atrial fibrillation: Secondary | ICD-10-CM | POA: Diagnosis not present

## 2023-06-14 DIAGNOSIS — Z5181 Encounter for therapeutic drug level monitoring: Secondary | ICD-10-CM

## 2023-06-14 LAB — POCT INR: INR: 3 (ref 2.0–3.0)

## 2023-06-14 NOTE — Patient Instructions (Signed)
CONTINUE 1 tablet daily except 1/2 tablet on Mondays and Fridays.  Repeat INR in 6 weeks [phone]

## 2023-06-15 ENCOUNTER — Ambulatory Visit (INDEPENDENT_AMBULATORY_CARE_PROVIDER_SITE_OTHER): Payer: Medicare HMO

## 2023-06-15 DIAGNOSIS — Z23 Encounter for immunization: Secondary | ICD-10-CM

## 2023-06-19 ENCOUNTER — Encounter: Payer: Self-pay | Admitting: Medical

## 2023-06-19 ENCOUNTER — Ambulatory Visit: Payer: Medicare HMO | Attending: Medical | Admitting: Medical

## 2023-06-19 VITALS — BP 126/72 | HR 46 | Ht 67.5 in | Wt 168.4 lb

## 2023-06-19 DIAGNOSIS — Z9889 Other specified postprocedural states: Secondary | ICD-10-CM | POA: Diagnosis not present

## 2023-06-19 DIAGNOSIS — I058 Other rheumatic mitral valve diseases: Secondary | ICD-10-CM

## 2023-06-19 DIAGNOSIS — E782 Mixed hyperlipidemia: Secondary | ICD-10-CM

## 2023-06-19 DIAGNOSIS — I4819 Other persistent atrial fibrillation: Secondary | ICD-10-CM | POA: Diagnosis not present

## 2023-06-19 DIAGNOSIS — I428 Other cardiomyopathies: Secondary | ICD-10-CM | POA: Diagnosis not present

## 2023-06-19 DIAGNOSIS — I700 Atherosclerosis of aorta: Secondary | ICD-10-CM

## 2023-06-19 NOTE — Progress Notes (Signed)
Cardiology Office Note:    Date:  06/19/2023   ID:  [name], DOB 05/16/1946, MRN [id number]  PCP:  [name], MD  [facility] Cardiologist:  Yvonne Kendall, MD  [facility] Electrophysiologist:  Lanier Prude, MD   Referring MD: [name], MD   Chief Complaint: cardioversion follow-up  History of Present Illness:    [name] is a 77 y.o. 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    R/L HC in 2005 showed no significant CAD. TEE from 2016 showed EF 10-15% with diffuse hypokinesis. Nuclear stress test in 2016 showed no evidence of ischemia with an EF of 37%. Echo from 12/2014 showed an EF 25-30% with moderate MR. Over the years, EF has continued to improve with echo from 2022 showing EF 55-60%, no WMA, moderate LVH, normal RVSF, severely dilated left atrium, prior replacement of mitral valve with mild regurgitation.    Patient was seen in April 2024 reporting chest pain.  Cardiac CTA and echocardiogram ordered.  Echo showed LVEF 60 to 65%, no wall motion abnormalities, mild LVH, normal RV SF, mildly elevated pulmonary artery systolic pressure, repaired/replaced mitral valve, mild MR with a mean gradient of 3 mmHg, severely dilated left atrium, mild to moderate TR.  Cardiac CTA showed no coronary calcium.   Patient went to the ER on 03/28/2023 for chest pain, shortness of breath and palpitations.  She was found to be in A-fib RVR with heart rates up to 120s.  Blood pressure was mildly elevated.  Labs showed nonspecific leukocytosis.  BNP 209.  INR 2.1.  Chest x-ray showed cardiomegaly with no pulmonary edema.  CTA chest showed no  PE.  Patient was recommended she double up on her Coreg and she was sent home.  Patient was seen in ER follow-up August 24 and was in rate controlled A-fib in the 70s.  She was overall feeling better but not back to baseline.  Patient was reloaded with amiodarone and sent to EP.  Given patient had been loaded with amiodarone, she was set up for repeat cardioversion.  Patient underwent successful cardioversion on May 12, 2023.  Today, the patient is in SB with a HR of 46bpm. She is taking amiodarone 100mg  daily. She is overall feeling good. She has pain in her knees. She still has occasional heart flutter. She is taking coumadin, INR 3.0. she is not taking lasix daily.   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   a) [facility] Echo: EF ~10% Severe LV dilation & global LV Systolic dysfxn, restrictive filling  pattern - Gr 3 DD, mod RV dysfxn, severe LA dilation - 6.4 cm, mod RA dil, mod pl effusion, mod MR, mild TR; b) TEE 10/07/14: EF ~10%, Severe LV dilation & dysfxn Mod RV dysfxn, LAA oversewn, Mod RA & TR   Diverticulosis    H/O: rheumatic fever    HYPERLIPIDEMIA 04/05/2010   Qualifier: Diagnosis of  By: Patsy Lager MD, [place]     Hypothyroid    Migraine headache    Osteopenia    Paroxysmal atrial fibrillation (HCC) 2005; Recurrent 09/2014   a) 2005: s/p Cox Maze & LAA  ligation; b) 09/2014: TEE-cardioversion   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  Posterior MV leaflet is fixed and immobile.  MAC.  reduced excursion of the anterior MV leaflet.  Mean MV gradient was 3mm Hg and MVA calculated at cm2.      Urinary incontinence     Past Surgical History:  Procedure Laterality Date   ABDOMINAL HYSTERECTOMY     CARDIAC CATHETERIZATION  Jan 2005   Pre-op R&LHC -- Nonobstructive CAD; normal PA pressures   CARDIOVERSION  06/04/2012   Procedure: CARDIOVERSION;  Surgeon: Luis Abed, MD;  Location: [facility] ENDOSCOPY;  Service: Cardiovascular;  Laterality: N/A;   CARDIOVERSION N/A 10/07/2014   Procedure: CARDIOVERSION;  Surgeon: Quintella Reichert, MD;  Location: [facility] ENDOSCOPY;  Service: Cardiovascular;  Laterality: N/A;   CARDIOVERSION N/A 05/12/2023   Procedure: CARDIOVERSION;  Surgeon: Debbe Odea, MD;  Location: [facility] ORS;  Service: Cardiovascular;  Laterality: N/A;   COLONOSCOPY WITH PROPOFOL N/A 07/11/2017   Procedure: COLONOSCOPY WITH PROPOFOL;  Surgeon: Toledo, Boykin Nearing, MD;  Location: [facility] ENDOSCOPY;  Service: Gastroenterology;  Laterality: N/A;   COLONOSCOPY WITH PROPOFOL N/A 05/17/2022   Procedure: COLONOSCOPY WITH PROPOFOL;  Surgeon: Midge Minium, MD;  Location: [facility] ENDOSCOPY;  Service: Endoscopy;  Laterality: N/A;   LUMBAR DISC SURGERY     x 2 distantly   MITRAL VALVE REPAIR  2005   with [facility] Maze for [employer]   NM MYOVIEW LTD  4/7/'16   EF 37% with septal hypokinesis and diffuse hypokinesis. No ischemia or infarction. 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   RIGHT HEART CATHETERIZATION N/A 10/03/2014   Procedure: RIGHT HEART CATH;  Surgeon: Dolores Patty, MD;  Location: [facility] CATH LAB;  Service:  Cardiovascular;  RAP , RVP 35/2/9 mmHg, PAP 45/12 mmHg, PCWP 16 mmHg; CO/I by Fick: 3.9/2.3; Ao/PA/SVC SaO2%: 97%/63%/65%.   TEE WITHOUT CARDIOVERSION N/A 10/07/2014   Procedure: TRANSESOPHAGEAL ECHOCARDIOGRAM (TEE);  Surgeon: Quintella Reichert, MD;  Location: [facility] ENDOSCOPY;  Service: Cardiovascular;  Laterality: N/A;   THYROIDECTOMY, PARTIAL     partial-no cancer; now on thyroid replacement   TRANSTHORACIC ECHOCARDIOGRAM  2013; 2/'16 & 5/'16   a) 2013: EF 60-65%, mild MR, Gr 2 DD; b) 2/'16 @ [facility]: EF ~10% - severel global LV dysfunction (systolic & diastolic) - dilated LV & restrictive filling pattern - Gr 3 DD, mod reduced RV function, severely dilated LA - 6.4 cm, mod dilated RA, mod pl effusion, mod MR, mild TR; c) 5/10/'16:  EF 25-30%, mod LV dilation, no RWMA,Gr 3 DD w/ high LAP, MV sewing ring intact w/ Mod MR, Severe LA dilation   TRANSTHORACIC ECHOCARDIOGRAM  January 2017:   EF  improved to 45-50%. Mild diffuse HK. Mild MR. Moderate LA dilation. Normal PA pressures.    Current Medications: Current Meds  Medication Sig   acetaminophen (TYLENOL) 500 MG tablet Take 1,000 mg by mouth every 8 (eight) hours as needed.   amiodarone (PACERONE) 200 MG tablet After 2 week taper completed then start Amiodarone 200 mg daily (Patient taking differently: Take 100 mg by mouth daily. After 2 week taper completed then start Amiodarone 200 mg daily)   BIOTIN PO Take 1 tablet by mouth daily.   Calcium Carbonate (CALCIUM 600 PO) Take 1 tablet by mouth daily.   Calcium Citrate-Vitamin D (CALCIUM + D PO) Take 1 capsule by mouth daily.   carvedilol (COREG) 3.125 MG tablet Take 1 tablet (3.125 mg total) by mouth 2 (two) times daily.   cetirizine (ZYRTEC) 10 MG tablet Take 10 mg by mouth daily.   Cholecalciferol (VITAMIN D-3) 125 MCG (5000 UT) TABS Take 1 tablet by mouth daily.   cyanocobalamin (VITAMIN B12) 1000 MCG tablet Take 1,000 mcg by mouth daily.   donepezil (ARICEPT) 5 MG tablet TAKE ONE TABLET BY  MOUTH DAILY AT BEDTIME   fluticasone (FLONASE) 50 MCG/ACT nasal spray PLACE TWO SPRAYS INTO BOTH NOSTRILS DAILY   IRON, FERROUS SULFATE, PO Take 1 tablet by mouth daily.   levothyroxine (SYNTHROID) 75 MCG tablet TAKE ONE TAB BY MOUTH ONCE DAILY. TAKE ON AN EMPTY STOMACH WITH A GLASS OF WATER ATLEAST 30-60 MINUTES BEFORE BREAKFAST   losartan (COZAAR) 25 MG tablet TAKE 1 TABLET BY MOUTH ONCE DAILY   Melatonin 5 MG CAPS Take 1 capsule by mouth at bedtime.   metroNIDAZOLE (METROCREAM) 0.75 % cream Apply topically 2 (two) times daily.   Omega-3 Fatty Acids (FISH OIL PO) Take 1 capsule by mouth daily.   pantoprazole (PROTONIX) 40 MG tablet TAKE ONE TABLET BY MOUTH DAILY   permethrin (ELIMITE) 5 % cream APPLY PEA SIZED AMOUNT TO FACE ONCE A DAY   rosuvastatin (CRESTOR) 10 MG tablet Take 1 tablet (10 mg total) by mouth daily.   traZODone (DESYREL) 50 MG tablet TAKE ONE HALF (1/2) TO ONE TABLET BY MOUTH AT BEDTIME AS NEEDED FOR SLEEP.   Vibegron (GEMTESA) 75 MG TABS Take 1 tablet by mouth daily.   warfarin (COUMADIN) 5 MG tablet TAKE ONE HALF (1/2) TO ONE TABLET BY MOUTH DAILY AS DIRECTED BY COUMADIN CLINIC     Allergies:   Patient has no known allergies.   Social History   Socioeconomic History   Marital status: Married    Spouse name: Not on file   Number of children: 2   Years of education: Not on file   Highest education level: Not on file  Occupational History   Occupation: Retired  Tobacco Use   Smoking status: Never   Smokeless tobacco: Never  Vaping Use   Vaping status: Never Used  Substance and Sexual Activity   Alcohol use: Not Currently   Drug use: No   Sexual activity: Yes  Other Topics Concern   Not on file  Social History Narrative   Not on file   Social Determinants of Health   Financial Resource Strain: Low Risk  (08/15/2022)   Overall Financial Resource Strain (CARDIA)    Difficulty of Paying Living Expenses: Not hard at all  Food Insecurity: No Food Insecurity  (08/15/2022)   Hunger Vital Sign    Worried About Running Out of Food in the Last Year: Never true    Ran Out of Food  in the Last Year: Never true  Transportation Needs: No Transportation Needs (08/15/2022)   PRAPARE - Administrator, Civil Service (Medical): No    Lack of Transportation (Non-Medical): No  Physical Activity: Inactive (08/14/2020)   Exercise Vital Sign    Days of Exercise per Week: 0 days    Minutes of Exercise per Session: 0 min  Stress: No Stress Concern Present (08/15/2022)   [employer] of Occupational Health - Occupational Stress Questionnaire    Feeling of Stress : Not at all  Social Connections: Unknown (08/15/2022)   Social Connection and Isolation Panel [NHANES]    Frequency of Communication with Friends and Family: Not on file    Frequency of Social Gatherings with Friends and Family: Not on file    Attends Religious Services: Not on file    Active Member of Clubs or Organizations: Not on file    Attends Banker Meetings: Not on file    Marital Status: Married     Family History: The patient's family history includes Coronary artery disease in her mother; Diabetes in her mother; Kidney disease in her mother. There is no history of Breast cancer.  ROS:   Please see the history of present illness.     All other systems reviewed and are negative.  EKGs/Labs/Other Studies Reviewed:    The following studies were reviewed today:  Echo 12/2022 1. Left ventricular ejection fraction, by estimation, is 60 to 65%. The  left ventricle has normal function. The left ventricle has no regional  wall motion abnormalities. There is mild left ventricular hypertrophy.  Left ventricular diastolic parameters  are indeterminate. The average left ventricular global longitudinal strain  is -13.0 %.   2. Right ventricular systolic function is normal. The right ventricular  size is normal. There is mildly elevated pulmonary artery systolic   pressure. The estimated right ventricular systolic pressure is 42.2 mmHg.   3. The mitral valve has been repaired/replaced. Mild mitral valve  regurgitation. No evidence of mitral stenosis. mean gradient 3 mm Hg s/p  prostetic annuloplasty ring   4. Left atrial size was severely dilated.   5. Tricuspid valve regurgitation is mild to moderate.   6. The aortic valve is tricuspid. Aortic valve regurgitation is not  visualized. No aortic stenosis is present.   7. The inferior vena cava is normal in size with greater than 50%  respiratory variability, suggesting right atrial pressure of 3 mmHg.   Comparison(s): 10/20/20: 55-60%, mild LVH, LAE, MV gradient  Hx MVR with prostetic annuloplasty ring.    Cardiac CTA 11/2022   IMPRESSION: 1. Normal coronary calcium score of 0.   2. Normal coronary origin with right dominance.   3. No evidence of CAD.   4. CAD-RADS 0. No evidence of CAD (0%). Consider non-atherosclerotic causes of chest pain.   Electronically Signed: By: Debbe Odea M.D. On: 12/22/2022 17:42    EKG:  EKG is ordered today.  The ekg ordered today demonstrates sinus bradycardia, 46 bpm, first-degree AV block PRI 216 ms, ST-T wave abnormalities anterolateral leads  Recent Labs: 02/22/2023: ALT 26 03/28/2023: B Natriuretic Peptide 209.8; TSH 0.615 05/09/2023: BUN 16; Creatinine, Ser 0.83; Hemoglobin 12.8; Platelets 181; Potassium 4.2; Sodium 141  Recent Lipid Panel    Component Value Date/Time   CHOL 125 02/22/2023 0933   CHOL 123 09/30/2014 1650   TRIG 130 02/22/2023 0933   TRIG 93 09/30/2014 1650   HDL 47 02/22/2023 0933   HDL  53 09/30/2014 1650   CHOLHDL 2.7 02/22/2023 0933   VLDL 26 02/22/2023 0933   VLDL 19 09/30/2014 1650   LDLCALC 52 02/22/2023 0933   LDLCALC 51 09/30/2014 1650   LDLDIRECT 132.3 03/31/2010 0906    Physical Exam:    VS:  BP 126/72 (BP Location: Left Arm, Patient Position: Sitting, Cuff Size: Normal)   Pulse (!) 46   Ht 5' 7.5"  (1.715 m)   Wt 168 lb 6.4 oz (76.4 kg)   SpO2 97%   BMI 25.99 kg/m     Wt Readings from Last 3 Encounters:  06/19/23 168 lb 6.4 oz (76.4 kg)  04/28/23 172 lb 3.2 oz (78.1 kg)  04/06/23 173 lb 3.2 oz (78.6 kg)     GEN:  Well nourished, well developed in no acute distress HEENT: Normal NECK: No JVD; No carotid bruits LYMPHATICS: No lymphadenopathy CARDIAC: RRR, +murmur, no rubs, gallops RESPIRATORY:  Clear to auscultation without rales, wheezing or rhonchi  ABDOMEN: Soft, non-tender, non-distended MUSCULOSKELETAL:  No edema; No deformity  SKIN: Warm and dry NEUROLOGIC:  Alert and oriented x 3 PSYCHIATRIC:  Normal affect   ASSESSMENT:    1. Persistent atrial fibrillation (HCC)   2. NICM (nonischemic cardiomyopathy) (HCC)   3. 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, status post MVR   4. S/P MVR (mitral valve repair)   5. Aortic atherosclerosis (HCC)   6. Hyperlipidemia, mixed    PLAN:    In order of problems listed above:  Persistent Afib/flutter with prior Maze procedure and cardioversion x4 Recent amiodarone load with successful cardioversion. Today, she is in SB with a HR of 46bpm and 1st degree AV block. She overall is feeling normal. She is taking amiodarone 100mg  daily and Coreg 3.125mg BID. I recommended stopping BB, but she is weary given complicated afib history. I said I will speak with EP and let her know. She has follow-up with EP in December, plan for labs at that time.  Continue Coumadin for stroke prophylaxis, most recent INR 3.  NICM MR s/p MVR Echo in May 2024 showed EF of 60 to 65%, mild LVH, repaired mitral valve, mild MR with no stenosis and a mean gradient of 3 mmHg, severely dilated left atrium, mild to moderate TR.  Patient is anemic on exam.  Patient takes Lasix as needed.  Continue losartan 25 mg daily and Coreg 3.125 mg twice daily.  Aortic atherosclerosis/HLD LDL 52, HDL 47, triglycerides 130, total cholesterol 125.  Continue Crestor 10  mg daily.  Disposition: Follow up in 6 month(s) with MD/APP   Signed, Sierra Spargo David Stall, PA-C  06/19/2023 9:35 AM    [facility]

## 2023-06-19 NOTE — Patient Instructions (Signed)

## 2023-07-26 ENCOUNTER — Ambulatory Visit: Payer: Medicare HMO | Attending: Internal Medicine

## 2023-07-26 DIAGNOSIS — I480 Paroxysmal atrial fibrillation: Secondary | ICD-10-CM | POA: Diagnosis not present

## 2023-07-26 DIAGNOSIS — Z5181 Encounter for therapeutic drug level monitoring: Secondary | ICD-10-CM | POA: Diagnosis not present

## 2023-07-26 LAB — POCT INR: INR: 2.5 (ref 2.0–3.0)

## 2023-07-26 NOTE — Patient Instructions (Signed)
CONTINUE 1 tablet daily except 1/2 tablet on Mondays and Fridays.  Repeat INR in 6 weeks [phone]

## 2023-07-31 ENCOUNTER — Ambulatory Visit: Payer: Medicare HMO | Admitting: Cardiology

## 2023-08-03 ENCOUNTER — Encounter: Payer: Self-pay | Admitting: Internal Medicine

## 2023-08-03 ENCOUNTER — Ambulatory Visit: Payer: Medicare HMO | Admitting: Internal Medicine

## 2023-08-03 VITALS — BP 112/70 | HR 56 | Temp 97.7°F | Ht 67.5 in | Wt 167.0 lb

## 2023-08-03 DIAGNOSIS — J069 Acute upper respiratory infection, unspecified: Secondary | ICD-10-CM | POA: Diagnosis not present

## 2023-08-03 LAB — POCT INFLUENZA A/B
Influenza A, POC: NEGATIVE
Influenza B, POC: NEGATIVE

## 2023-08-03 LAB — POC COVID19 BINAXNOW: SARS Coronavirus 2 Ag: NEGATIVE

## 2023-08-03 NOTE — Addendum Note (Signed)
Addended by: Eual Fines on: 08/03/2023 12:16 PM   Modules accepted: Orders

## 2023-08-03 NOTE — Progress Notes (Signed)
Subjective:    Patient ID: [name], female    DOB: 12-Aug-1946, 77 y.o.   MRN: [id number]  HPI Here due to respiratory illness  Started with dry cough 2 days ago Worse cough last night Now with sore throat Has had some nasal drainage--from nose Feels "rotten" No fever--just feels bad. Not hot No shaking or chills. No change in typical body pain--not flu like Gets cough with deep breath---no SOB  Tried nyquil--no help Takes tylenol regularly for knees  Current Outpatient Medications on File Prior to Visit  Medication Sig Dispense Refill   acetaminophen (TYLENOL) 500 MG tablet Take 1,000 mg by mouth every 8 (eight) hours as needed.     amiodarone (PACERONE) 200 MG tablet After 2 week taper completed then start Amiodarone 200 mg daily (Patient taking differently: Take 100 mg by mouth daily. After 2 week taper completed then start Amiodarone 200 mg daily) 90 tablet 3   BIOTIN PO Take 1 tablet by mouth daily.     Calcium Carbonate (CALCIUM 600 PO) Take 1 tablet by mouth daily.     Calcium Citrate-Vitamin D (CALCIUM + D PO) Take 1 capsule by mouth daily.     carvedilol (COREG) 3.125 MG tablet Take 1 tablet (3.125 mg total) by mouth 2 (two) times daily. 180 tablet 3   cetirizine (ZYRTEC) 10 MG tablet Take 10 mg by mouth daily.     Cholecalciferol (VITAMIN D-3) 125 MCG (5000 UT) TABS Take 1 tablet by mouth daily.     cyanocobalamin (VITAMIN B12) 1000 MCG tablet Take 1,000 mcg by mouth daily.     donepezil (ARICEPT) 5 MG tablet TAKE ONE TABLET BY MOUTH DAILY AT BEDTIME 90 tablet 3   fluticasone (FLONASE) 50 MCG/ACT nasal spray PLACE TWO SPRAYS INTO BOTH NOSTRILS DAILY 48 g 3   IRON, FERROUS SULFATE, PO Take 1 tablet by mouth daily.     levothyroxine (SYNTHROID) 75 MCG tablet TAKE ONE TAB BY MOUTH ONCE DAILY. TAKE ON AN EMPTY STOMACH WITH A GLASS OF WATER ATLEAST 30-60 MINUTES BEFORE BREAKFAST 90 tablet 3   losartan (COZAAR) 25 MG tablet TAKE 1 TABLET BY MOUTH ONCE DAILY 90 tablet 3    Melatonin 5 MG CAPS Take 1 capsule by mouth at bedtime.     metroNIDAZOLE (METROCREAM) 0.75 % cream Apply topically 2 (two) times daily. 45 g 5   Omega-3 Fatty Acids (FISH OIL PO) Take 1 capsule by mouth daily.     pantoprazole (PROTONIX) 40 MG tablet TAKE ONE TABLET BY MOUTH DAILY 30 tablet 5   permethrin (ELIMITE) 5 % cream APPLY PEA SIZED AMOUNT TO FACE ONCE A DAY  1   rosuvastatin (CRESTOR) 10 MG tablet Take 1 tablet (10 mg total) by mouth daily. 90 tablet 3   traZODone (DESYREL) 50 MG tablet TAKE ONE HALF (1/2) TO ONE TABLET BY MOUTH AT BEDTIME AS NEEDED FOR SLEEP. 90 tablet 1   warfarin (COUMADIN) 5 MG tablet TAKE ONE HALF (1/2) TO ONE TABLET BY MOUTH DAILY AS DIRECTED BY COUMADIN CLINIC 90 tablet 0   amiodarone (PACERONE) 200 MG tablet Take 2 tablets (400 mg total) by mouth 2 (two) times daily for 7 days, THEN 1 tablet (200 mg total) 2 (two) times daily for 7 days. 42 tablet 0   furosemide (LASIX) 20 MG tablet Take 1 tablet (20 mg total) by mouth daily. 90 tablet 3   No current facility-administered medications on file prior to visit.    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   a) [facility] Echo: EF ~10% Severe LV dilation & global LV Systolic dysfxn, restrictive filling pattern - Gr 3 DD, mod RV dysfxn, severe LA dilation - 6.4 cm, mod RA dil,  mod pl effusion, mod MR, mild TR; b) TEE 10/07/14: EF ~10%, Severe LV dilation & dysfxn Mod RV dysfxn, LAA oversewn, Mod RA & TR   Diverticulosis    H/O: rheumatic fever    HYPERLIPIDEMIA 04/05/2010   Qualifier: Diagnosis of  By: Patsy Lager MD, [place]     Hypothyroid    Migraine headache    Osteopenia    Paroxysmal atrial fibrillation (HCC) 2005; Recurrent 09/2014   a) 2005: s/p Cox Maze & LAA ligation; b) 09/2014: TEE-cardioversion   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  Posterior MV leaflet is fixed and immobile.  MAC.  reduced excursion of the anterior MV leaflet.  Mean MV gradient was 3mm Hg and MVA calculated at cm2.      Urinary incontinence     Past Surgical History:  Procedure Laterality Date   ABDOMINAL HYSTERECTOMY     CARDIAC CATHETERIZATION  Jan 2005   Pre-op R&LHC -- Nonobstructive CAD; normal PA pressures   CARDIOVERSION  06/04/2012   Procedure: CARDIOVERSION;  Surgeon: Luis Abed, MD;  Location: [facility] ENDOSCOPY;  Service: Cardiovascular;  Laterality: N/A;   CARDIOVERSION N/A 10/07/2014   Procedure: CARDIOVERSION;  Surgeon: Quintella Reichert, MD;  Location: [facility] ENDOSCOPY;  Service: Cardiovascular;  Laterality: N/A;   CARDIOVERSION N/A 05/12/2023   Procedure: CARDIOVERSION;  Surgeon: Debbe Odea, MD;  Location: [facility] ORS;  Service: Cardiovascular;  Laterality: N/A;   COLONOSCOPY WITH PROPOFOL N/A 07/11/2017   Procedure: COLONOSCOPY WITH PROPOFOL;  Surgeon: Toledo, Boykin Nearing, MD;  Location: [facility] ENDOSCOPY;  Service: Gastroenterology;  Laterality: N/A;   COLONOSCOPY WITH PROPOFOL N/A 05/17/2022   Procedure: COLONOSCOPY WITH PROPOFOL;  Surgeon: Midge Minium, MD;  Location: [facility] ENDOSCOPY;  Service: Endoscopy;  Laterality: N/A;   LUMBAR DISC SURGERY     x 2 distantly   MITRAL VALVE REPAIR   2005   with Cox Maze for [employer]   NM MYOVIEW LTD  4/7/'16   EF 37% with septal hypokinesis and diffuse hypokinesis. No ischemia or infarction. 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   RIGHT HEART CATHETERIZATION N/A 10/03/2014   Procedure: RIGHT HEART CATH;  Surgeon: Dolores Patty, MD;  Location: [facility] CATH LAB;  Service: Cardiovascular;  RAP , RVP 35/2/9 mmHg, PAP 45/12 mmHg, PCWP 16 mmHg; CO/I by Fick: 3.9/2.3; Ao/PA/SVC SaO2%: 97%/63%/65%.   TEE WITHOUT CARDIOVERSION N/A 10/07/2014   Procedure: TRANSESOPHAGEAL  ECHOCARDIOGRAM (TEE);  Surgeon: Quintella Reichert, MD;  Location: [facility] ENDOSCOPY;  Service: Cardiovascular;  Laterality: N/A;   THYROIDECTOMY, PARTIAL     partial-no cancer; now on thyroid replacement   TRANSTHORACIC ECHOCARDIOGRAM  2013; 2/'16 & 5/'16   a) 2013: EF 60-65%, mild MR, Gr 2 DD; b) 2/'16 @ [facility]: EF ~10% - severel global LV dysfunction (systolic & diastolic) - dilated LV & restrictive filling pattern - Gr 3 DD, mod reduced RV function, severely dilated LA - 6.4 cm, mod dilated RA, mod pl effusion, mod MR, mild TR; c) 5/10/'16:  EF 25-30%, mod LV dilation, no RWMA,Gr 3 DD w/ high LAP, MV sewing ring intact w/ Mod MR, Severe LA dilation   TRANSTHORACIC ECHOCARDIOGRAM  January 2017:   EF improved to 45-50%. Mild diffuse HK. Mild MR. Moderate LA dilation. Normal PA pressures.    Family History  Problem Relation Age of Onset   Diabetes Mother    Coronary artery disease Mother    Kidney disease Mother    Breast cancer Neg Hx     Social History   Socioeconomic History   Marital status: Married    Spouse name: Not on file   Number of children: 2   Years of education: Not on file   Highest education level: Not on file  Occupational History   Occupation: Retired  Tobacco Use   Smoking status: Never   Smokeless tobacco: Never  Vaping Use   Vaping status: Never Used  Substance and Sexual Activity   Alcohol use:  Not Currently   Drug use: No   Sexual activity: Yes  Other Topics Concern   Not on file  Social History Narrative   Not on file   Social Determinants of Health   Financial Resource Strain: Low Risk  (08/15/2022)   Overall Financial Resource Strain (CARDIA)    Difficulty of Paying Living Expenses: Not hard at all  Food Insecurity: No Food Insecurity (08/15/2022)   Hunger Vital Sign    Worried About Running Out of Food in the Last Year: Never true    Ran Out of Food in the Last Year: Never true  Transportation Needs: No Transportation Needs (08/15/2022)   PRAPARE - Administrator, Civil Service (Medical): No    Lack of Transportation (Non-Medical): No  Physical Activity: Inactive (08/14/2020)   Exercise Vital Sign    Days of Exercise per Week: 0 days    Minutes of Exercise per Session: 0 min  Stress: No Stress Concern Present (08/15/2022)   [employer] of Occupational Health - Occupational Stress Questionnaire    Feeling of Stress : Not at all  Social Connections: Unknown (08/15/2022)   Social Connection and Isolation Panel [NHANES]    Frequency of Communication with Friends and Family: Not on file    Frequency of Social Gatherings with Friends and Family: Not on file    Attends Religious Services: Not on file    Active Member of Clubs or Organizations: Not on file    Attends Banker Meetings: Not on file    Marital Status: Married  Intimate Partner Violence: Not At Risk (08/15/2022)   Humiliation, Afraid, Rape, and Kick questionnaire    Fear of Current or Ex-Partner: No    Emotionally Abused: No    Physically Abused: No    Sexually Abused: No   Review of Systems No change in smell or taste No N/V Able to eat--but appetite is off    Objective:  Physical Exam Constitutional:      Appearance: Normal appearance.  HENT:     Head:     Comments: Mild frontal tenderness    Right Ear: Tympanic membrane and ear canal normal.     Left Ear:  Tympanic membrane and ear canal normal.     Mouth/Throat:     Pharynx: No oropharyngeal exudate or posterior oropharyngeal erythema.  Pulmonary:     Effort: Pulmonary effort is normal.     Breath sounds: Normal breath sounds. No wheezing or rales.  Musculoskeletal:     Cervical back: Neck supple.  Lymphadenopathy:     Cervical: No cervical adenopathy.  Neurological:     Mental Status: She is alert.            Assessment & Plan:

## 2023-08-03 NOTE — Assessment & Plan Note (Signed)
COVID and flu tests negative Discussed natural history---general self limited by a week or so Supportive care with DM cough meds and tylenol If worsens/persists next week---would try empiric augmentin

## 2023-08-03 NOTE — Progress Notes (Signed)
Cardiology Clinic Note    Date:  08/04/2023  Patient ID:  [name], [name] [birth year], MRN [id number] PCP:  [name], MD  Cardiologist:  Yvonne Kendall, MD Electrophysiologist: Lanier Prude, MD     Patient Profile    Chief Complaint: afib follow-up  History of Present Illness: [name] is a 77 y.o. 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  She last saw Dr. Lalla Brothers 03/2023 for EP consultation of afib. Her left atrium is severely dilated on TTE, not invasive EP procedure candidate d/t dilated LA. She was on amiodarone at that time and had recently been re-loaded so a DCCV was recommended. If this DCCV fails, she will be considered perm afib.  She is s/p successful DCCV 9/13. She saw PA Fransico Michael 05/2023 where she was profoundly bradycardic to 46, but hesitant to stop coreg or further reduce amiodarone less than 100mg  daily for fear of her AFib returning.  On follow-up today, she has not had any afib episodes since her cardioversion. She does have intermittent, very brief palpitation in the evenings. These episodes are very brief and feel like a flipflop in her chest.  She has no activity restrictions, has a hobby farm and cares for animals. Able to grocery shop without SOB, dizziness, presyncope.   She is on warfarin for stroke ppx, managed at [facility] office. No bleeding concerns    AAD History: Amiodarone     ROS:  Please see the history of present illness. All other systems are reviewed and otherwise negative.    Physical Exam    VS:  BP 126/68 (BP Location: Left Arm, Patient Position: Sitting, Cuff Size: Normal)   Pulse (!) 49   Ht 5' 7.5" (1.715 m)   Wt 167 lb 12.8 oz (76.1 kg)   SpO2 98%   BMI 25.89 kg/m  BMI: Body mass index is 25.89 kg/m.  Wt Readings from  Last 3 Encounters:  08/04/23 167 lb 12.8 oz (76.1 kg)  08/03/23 167 lb (75.8 kg)  06/19/23 168 lb 6.4 oz (76.4 kg)     GEN- The patient is well appearing, alert and oriented x 3 today.   Lungs- Clear to ausculation bilaterally, normal work of breathing.  Heart- Regular, bradycardic rate and rhythm, no murmurs, rubs or gallops Extremities- No peripheral edema, warm, dry    Studies Reviewed   Previous EP, cardiology notes.    EKG is ordered. Personal review of EKG from today shows:   EKG Interpretation Date/Time:  Friday August 04 2023 11:25:06 EST Ventricular Rate:  49 PR Interval:  270 QRS Duration:  98 QT Interval:  488 QTC Calculation: 440 R Axis:   29  Text Interpretation: Sinus bradycardia with 1st degree A-V block Confirmed by Sherie Don [phone]) on 08/04/2023 11:31:26 AM    TTE, 01/03/2023  1. Left ventricular ejection fraction, by estimation, is 60 to 65%. The left ventricle has normal function. The left ventricle has no regional wall motion abnormalities. There is mild left ventricular hypertrophy. Left ventricular diastolic parameters are indeterminate. The average left ventricular global longitudinal strain is -13.0 %.   2. Right ventricular systolic function is normal. The right ventricular size is normal. There is mildly elevated pulmonary artery systolic pressure. The estimated right ventricular systolic pressure is 42.2 mmHg.   3. The mitral valve has been  repaired/replaced. Mild mitral valve regurgitation. No evidence of mitral stenosis. mean gradient 3 mm Hg s/p prostetic annuloplasty ring   4. Left atrial size was severely dilated.   5. Tricuspid valve regurgitation is mild to moderate.   6. The aortic valve is tricuspid. Aortic valve regurgitation is not visualized. No aortic stenosis is present.   7. The inferior vena cava is normal in size with greater than 50% respiratory variability, suggesting right atrial pressure of 3 mmHg.   Comparison(s): 10/20/20:  55-60%, mild LVH, LAE, MV gradient Hx MVR with prostetic annuloplasty ring.   Coronary CTA, 12/22/2022 1. Normal coronary calcium score of 0.  2. Normal coronary origin with right dominance.  3. No evidence of CAD.  4. CAD-RADS 0. No evidence of CAD (0%). Consider non-atherosclerotic causes of chest pain.  TTE, 10/20/2020  1. Left ventricular ejection fraction, by estimation, is 55 to 60%. The left ventricle has normal function. The left ventricle has no regional wall motion abnormalities. There is moderate left ventricular hypertrophy. Left ventricular diastolic parameters are indeterminate.   2. Right ventricular systolic function is normal. The right ventricular size is normal. Mildly increased right ventricular wall thickness. There is normal pulmonary artery systolic pressure.   3. Left atrial size was severely dilated.   4. The mitral valve has been repaired/replaced. Mild mitral valve regurgitation. No evidence of mitral stenosis. The mean mitral valve gradient is 3.0 mmHg. There is a prosthetic annuloplasty ring present in the mitral position.   5. The aortic valve is tricuspid. Aortic valve regurgitation is not visualized. No aortic stenosis is present.   TEE, 10/07/2014 LVEF 10-15%     Assessment and Plan     #) Persis AFib #) high risk medication monitoring - amiodarone #) bradycardia Severely dilated left atrium Not candidate for invasive EP procedure Maintaining sinus rhythm, though bradycardic. She is asymptomatic with her bradycardia with no activity limitations Continue amiodarone 100mg  daily Update thyroid and CMP today   #) Hypercoag d/t persis afib CHA2DS2-VASc Score = at least 5 [CHF History: 1, HTN History: 1, Diabetes History: 0, Stroke History: 0, Vascular Disease History: 0, Age Score: 2, Gender Score: 1].  Therefore, the patient's annual risk of stroke is 7.2 %.    Stroke ppx - warfarin, INR goal 2-3. Managed at [facility] office No bleeding  concerns    #) HFimpEF #) valvular heart disease S/p MVr/MAZE, LAAL Warm and euvolemic on exam NYHA II TTE 12/2022 with LVEF 60-65% Continue coreg 3.125mg  BID, losartan 25mg   Continue 20mg  lasix daily        Current medicines are reviewed at length with the patient today.   The patient does not have concerns regarding her medicines.  The following changes were made today:  none  Labs/ tests ordered today include:  Orders Placed This Encounter  Procedures   Comp Met (CMET)   TSH   T4   EKG 12-Lead     Disposition: Follow up with Dr. Lalla Brothers or EP APP in 6 months   Signed, Sherie Don, NP  08/04/23  12:41 PM  Electrophysiology [facility]

## 2023-08-04 ENCOUNTER — Encounter: Payer: Self-pay | Admitting: Cardiology

## 2023-08-04 ENCOUNTER — Other Ambulatory Visit: Payer: Self-pay | Admitting: Cardiology

## 2023-08-04 ENCOUNTER — Ambulatory Visit: Payer: Medicare HMO | Attending: Cardiology | Admitting: Cardiology

## 2023-08-04 VITALS — BP 126/68 | HR 49 | Ht 67.5 in | Wt 167.8 lb

## 2023-08-04 DIAGNOSIS — I4819 Other persistent atrial fibrillation: Secondary | ICD-10-CM

## 2023-08-04 DIAGNOSIS — I058 Other rheumatic mitral valve diseases: Secondary | ICD-10-CM | POA: Diagnosis not present

## 2023-08-04 DIAGNOSIS — Z79899 Other long term (current) drug therapy: Secondary | ICD-10-CM

## 2023-08-04 DIAGNOSIS — Z7901 Long term (current) use of anticoagulants: Secondary | ICD-10-CM

## 2023-08-04 DIAGNOSIS — I5032 Chronic diastolic (congestive) heart failure: Secondary | ICD-10-CM

## 2023-08-04 NOTE — Patient Instructions (Addendum)
Medication Instructions:   Your physician recommends that you continue on your current medications as directed. Please refer to the Current Medication list given to you today.  *If you need a refill on your cardiac medications before your next appointment, please call your pharmacy*   Lab Work:  Your provider would like for you to have the following labs drawn: Today.   CMP TSH T4  Please go to [facility] [address] [address], [place] [address] You do not need an appointment.  They are open from 7:30 am-4 pm.  Lunch from 1:00 pm- 2:00 pm   If you have labs (blood work) drawn today and your tests are completely normal, you will receive your results only by: MyChart Message (if you have MyChart) OR A paper copy in the mail If you have any lab test that is abnormal or we need to change your treatment, we will call you to review the results.   Testing/Procedures:  NONE   Follow-Up: At [facility], you and your health needs are our priority.  As part of our continuing mission to provide you with exceptional heart care, we have created designated Provider Care Teams.  These Care Teams include your primary Cardiologist (physician) and Advanced Practice Providers (APPs -  Physician Assistants and Nurse Practitioners) who all work together to provide you with the care you need, when you need it.  We recommend signing up for the patient portal called "MyChart".  Sign up information is provided on this After Visit Summary.  MyChart is used to connect with patients for Virtual Visits (Telemedicine).  Patients are able to view lab/test results, encounter notes, upcoming appointments, etc.  Non-urgent messages can be sent to your provider as well.   To learn more about what you can do with MyChart, go to [email or web address].    Your next appointment:   6 month(s)  Provider:   Steffanie Dunn, MD or Sherie Don, NP    *Schedule End  or [facility] 6 month recall

## 2023-08-05 LAB — COMPREHENSIVE METABOLIC PANEL
ALT: 20 [IU]/L (ref 0–32)
AST: 20 [IU]/L (ref 0–40)
Albumin: 4.5 g/dL (ref 3.8–4.8)
Alkaline Phosphatase: 86 [IU]/L (ref 44–121)
BUN/Creatinine Ratio: 15 (ref 12–28)
BUN: 13 mg/dL (ref 8–27)
Bilirubin Total: 0.6 mg/dL (ref 0.0–1.2)
CO2: 23 mmol/L (ref 20–29)
Calcium: 9.4 mg/dL (ref 8.7–10.3)
Chloride: 108 mmol/L — ABNORMAL HIGH (ref 96–106)
Creatinine, Ser: 0.87 mg/dL (ref 0.57–1.00)
Globulin, Total: 2 g/dL (ref 1.5–4.5)
Glucose: 92 mg/dL (ref 70–99)
Potassium: 5.1 mmol/L (ref 3.5–5.2)
Sodium: 144 mmol/L (ref 134–144)
Total Protein: 6.5 g/dL (ref 6.0–8.5)
eGFR: 68 mL/min/{1.73_m2} (ref >59)

## 2023-08-05 LAB — T4: T4, Total: 11.4 ug/dL (ref 4.5–12.0)

## 2023-08-05 LAB — TSH: TSH: 1.4 u[IU]/mL (ref 0.450–4.500)

## 2023-08-07 ENCOUNTER — Telehealth: Payer: Self-pay | Admitting: Family Medicine

## 2023-08-07 MED ORDER — GUAIFENESIN-CODEINE 100-10 MG/5ML PO SOLN: 5.0000 mL | Freq: Every evening | ORAL | 0 refills | Status: DC | PRN

## 2023-08-07 MED ORDER — CEFDINIR 300 MG PO CAPS: 600.0000 mg | ORAL_CAPSULE | Freq: Every day | ORAL | 0 refills | Status: DC

## 2023-08-07 NOTE — Telephone Encounter (Signed)
Dr. Alphonsus Sias is out of the office today.  Please advise.Marland Kitchen

## 2023-08-07 NOTE — Telephone Encounter (Signed)
Ok.  I sent in some Garland and Robitussin with codeine.

## 2023-08-07 NOTE — Telephone Encounter (Signed)
Mrs. Asmar notified by telephone that Dr. Patsy Lager has sent in an antibiotic and cough syrup to her pharmacy.

## 2023-08-07 NOTE — Telephone Encounter (Signed)
Patient called in and stated that she seen Dr. Alphonsus Sias last week and was told to call back in if she wasn't feeling any better. She stated that she is still feeling horrible and would like to go ahead and get the antibiotic, and some cough syrup. Thank you!

## 2023-08-10 ENCOUNTER — Ambulatory Visit: Payer: Medicare HMO

## 2023-08-10 VITALS — Ht 67.5 in | Wt 167.0 lb

## 2023-08-10 DIAGNOSIS — Z0000 Encounter for general adult medical examination without abnormal findings: Secondary | ICD-10-CM

## 2023-08-10 NOTE — Patient Instructions (Signed)
[name] , Thank you for taking time to come for your Medicare Wellness Visit. I appreciate your ongoing commitment to your health goals. Please review the following plan we discussed and let me know if I can assist you in the future.   Referrals/Orders/Follow-Ups/Clinician Recommendations: none  This is a list of the screening recommended for you and due dates:  Health Maintenance  Topic Date Due   COVID-19 Vaccine (5 - 2024-25 season) 04/30/2023   Medicare Annual Wellness Visit  08/09/2024   DTaP/Tdap/Td vaccine (2 - Tdap) 02/26/2029   Pneumonia Vaccine  Completed   Flu Shot  Completed   DEXA scan (bone density measurement)  Completed   Hepatitis C Screening  Completed   Zoster (Shingles) Vaccine  Completed   HPV Vaccine  Aged Out   Colon Cancer Screening  Discontinued    Advanced directives: (Declined) Advance directive discussed with you today. Even though you declined this today, please call our office should you change your mind, and we can give you the proper paperwork for you to fill out.  Next Medicare Annual Wellness Visit scheduled for next year: Yes 08/12/24 @ 10:10am telephone

## 2023-08-10 NOTE — Progress Notes (Signed)
Subjective:   [name] is a 77 y.o. female who presents for Medicare Annual (Subsequent) preventive examination.  Visit Complete: Virtual I connected with  Angelena Form on 08/10/23 by a audio enabled telemedicine application and verified that I am speaking with the correct person using two identifiers.  Patient Location: Home  Provider Location: Home Office  I discussed the limitations of evaluation and management by telemedicine. The patient expressed understanding and agreed to proceed.  Vital Signs: Because this visit was a virtual/telehealth visit, some criteria may be missing or patient reported. Any vitals not documented were not able to be obtained and vitals that have been documented are patient reported.  Patient Medicare AWV questionnaire was completed by the patient on (not done); I have confirmed that all information answered by patient is correct and no changes since this date. Cardiac Risk Factors include: advanced age (>64men, >15 women);dyslipidemia;hypertension;sedentary lifestyle    Objective:    Today's Vitals   08/10/23 1025  Weight: 167 lb (75.8 kg)  Height: 5' 7.5" (1.715 m)   Body mass index is 25.77 kg/m.     08/10/2023   10:39 AM 03/28/2023    6:29 PM 08/15/2022   10:47 AM 05/17/2022    8:07 AM 02/09/2021   11:08 AM 08/14/2020    8:19 AM 05/27/2019    9:15 AM  Advanced Directives  Does Patient Have a Medical Advance Directive? No No No No No Yes Yes  Type of Careers adviser;Living will Healthcare Power of [place];Living will  Copy of Healthcare Power of Attorney in Chart?      No - copy requested No - copy requested  Would patient like information on creating a medical advance directive?   No - Patient declined No - Patient declined No - Patient declined      Current Medications (verified) Outpatient Encounter Medications as of 08/10/2023  Medication Sig   acetaminophen (TYLENOL) 500 MG tablet Take 1,000  mg by mouth every 8 (eight) hours as needed.   amiodarone (PACERONE) 200 MG tablet After 2 week taper completed then start Amiodarone 200 mg daily (Patient taking differently: Take 100 mg by mouth daily. After 2 week taper completed then start Amiodarone 200 mg daily)   BIOTIN PO Take 1 tablet by mouth daily.   Calcium Carbonate (CALCIUM 600 PO) Take 1 tablet by mouth daily.   Calcium Citrate-Vitamin D (CALCIUM + D PO) Take 1 capsule by mouth daily.   carvedilol (COREG) 3.125 MG tablet Take 1 tablet (3.125 mg total) by mouth 2 (two) times daily.   cefdinir (OMNICEF) 300 MG capsule Take 2 capsules (600 mg total) by mouth daily.   cetirizine (ZYRTEC) 10 MG tablet Take 10 mg by mouth daily.   Cholecalciferol (VITAMIN D-3) 125 MCG (5000 UT) TABS Take 1 tablet by mouth daily.   cyanocobalamin (VITAMIN B12) 1000 MCG tablet Take 1,000 mcg by mouth daily.   donepezil (ARICEPT) 5 MG tablet TAKE ONE TABLET BY MOUTH DAILY AT BEDTIME   fluticasone (FLONASE) 50 MCG/ACT nasal spray PLACE TWO SPRAYS INTO BOTH NOSTRILS DAILY   furosemide (LASIX) 20 MG tablet Take 1 tablet (20 mg total) by mouth daily.   guaiFENesin-codeine 100-10 MG/5ML syrup Take 5 mLs by mouth at bedtime as needed for cough.   IRON, FERROUS SULFATE, PO Take 1 tablet by mouth daily.   levothyroxine (SYNTHROID) 75 MCG tablet TAKE ONE TAB BY MOUTH ONCE DAILY. TAKE ON AN  EMPTY STOMACH WITH A GLASS OF WATER ATLEAST 30-60 MINUTES BEFORE BREAKFAST   losartan (COZAAR) 25 MG tablet TAKE 1 TABLET BY MOUTH ONCE DAILY   Melatonin 5 MG CAPS Take 1 capsule by mouth at bedtime.   metroNIDAZOLE (METROCREAM) 0.75 % cream Apply topically 2 (two) times daily.   Omega-3 Fatty Acids (FISH OIL PO) Take 1 capsule by mouth daily.   pantoprazole (PROTONIX) 40 MG tablet TAKE ONE TABLET BY MOUTH DAILY   permethrin (ELIMITE) 5 % cream APPLY PEA SIZED AMOUNT TO FACE ONCE A DAY   rosuvastatin (CRESTOR) 10 MG tablet Take 1 tablet (10 mg total) by mouth daily.   traZODone  (DESYREL) 50 MG tablet TAKE ONE HALF (1/2) TO ONE TABLET BY MOUTH AT BEDTIME AS NEEDED FOR SLEEP.   warfarin (COUMADIN) 5 MG tablet TAKE ONE HALF (1/2) TO ONE TABLET BY MOUTH DAILY AS DIRECTED BY COUMADIN CLINIC   amiodarone (PACERONE) 200 MG tablet Take 2 tablets (400 mg total) by mouth 2 (two) times daily for 7 days, THEN 1 tablet (200 mg total) 2 (two) times daily for 7 days.   No facility-administered encounter medications on file as of 08/10/2023.    Allergies (verified) Patient has no known allergies.   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   a) [facility] Echo: EF ~10% Severe LV dilation & global LV Systolic dysfxn, restrictive filling pattern - Gr 3 DD, mod RV dysfxn, severe LA dilation - 6.4 cm, mod RA dil, mod pl effusion, mod MR, mild TR; b) TEE 10/07/14: EF ~10%, Severe LV dilation & dysfxn Mod RV dysfxn, LAA oversewn, Mod RA & TR   Diverticulosis    H/O: rheumatic fever    HYPERLIPIDEMIA 04/05/2010   Qualifier: Diagnosis of  By: Patsy Lager MD, [place]     Hypothyroid    Migraine headache    Osteopenia    Paroxysmal atrial fibrillation (HCC) 2005; Recurrent 09/2014   a) 2005: s/p Cox Maze & LAA ligation; b) 09/2014: TEE-cardioversion   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  Posterior MV leaflet is fixed and immobile.  MAC.  reduced excursion of the anterior MV leaflet.  Mean MV gradient was 3mm Hg and MVA calculated at cm2.      Urinary incontinence    Past Surgical History:  Procedure Laterality Date   ABDOMINAL HYSTERECTOMY     CARDIAC CATHETERIZATION  Jan 2005   Pre-op R&LHC -- Nonobstructive CAD; normal PA pressures   CARDIOVERSION  06/04/2012   Procedure: CARDIOVERSION;  Surgeon: Luis Abed, MD;  Location: [facility] ENDOSCOPY;  Service: Cardiovascular;  Laterality: N/A;   CARDIOVERSION N/A 10/07/2014   Procedure: CARDIOVERSION;  Surgeon: Quintella Reichert, MD;  Location: [facility] ENDOSCOPY;  Service: Cardiovascular;  Laterality: N/A;   CARDIOVERSION N/A 05/12/2023   Procedure: CARDIOVERSION;  Surgeon: Debbe Odea, MD;  Location: [facility] ORS;  Service: Cardiovascular;  Laterality: N/A;   COLONOSCOPY WITH PROPOFOL N/A 07/11/2017   Procedure: COLONOSCOPY WITH PROPOFOL;  Surgeon: Toledo, Boykin Nearing, MD;  Location: [facility] ENDOSCOPY;  Service: Gastroenterology;  Laterality: N/A;   COLONOSCOPY WITH PROPOFOL N/A 05/17/2022   Procedure: COLONOSCOPY WITH PROPOFOL;  Surgeon: Midge Minium, MD;  Location: [facility] ENDOSCOPY;  Service: Endoscopy;  Laterality: N/A;   LUMBAR DISC SURGERY     x 2 distantly   MITRAL VALVE REPAIR  2005   with [facility] Maze for [employer]   NM MYOVIEW LTD  4/7/'16   EF 37% with septal hypokinesis and diffuse hypokinesis. No ischemia or infarction. 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   RIGHT HEART CATHETERIZATION N/A 10/03/2014   Procedure: RIGHT HEART CATH;  Surgeon: Dolores Patty, MD;  Location: [facility] CATH LAB;  Service: Cardiovascular;  RAP , RVP 35/2/9 mmHg, PAP 45/12 mmHg, PCWP 16 mmHg; CO/I by Fick: 3.9/2.3; Ao/PA/SVC SaO2%: 97%/63%/65%.   TEE WITHOUT CARDIOVERSION N/A 10/07/2014    Procedure: TRANSESOPHAGEAL ECHOCARDIOGRAM (TEE);  Surgeon: Quintella Reichert, MD;  Location: [facility] ENDOSCOPY;  Service: Cardiovascular;  Laterality: N/A;   THYROIDECTOMY, PARTIAL     partial-no cancer; now on thyroid replacement   TRANSTHORACIC ECHOCARDIOGRAM  2013; 2/'16 & 5/'16   a) 2013: EF 60-65%, mild MR, Gr 2 DD; b) 2/'16 @ [facility]: EF ~10% - severel global LV dysfunction (systolic & diastolic) - dilated LV & restrictive filling pattern - Gr 3 DD, mod reduced RV function, severely dilated LA - 6.4 cm, mod dilated RA, mod pl effusion, mod MR, mild TR; c) 5/10/'16:  EF 25-30%, mod LV dilation, no RWMA,Gr 3 DD w/ high LAP, MV sewing ring intact w/ Mod MR, Severe LA dilation   TRANSTHORACIC ECHOCARDIOGRAM  January 2017:   EF improved to 45-50%. Mild diffuse HK. Mild MR. Moderate LA dilation. Normal PA pressures.   Family History  Problem Relation Age of Onset   Diabetes Mother    Coronary artery disease Mother    Kidney disease Mother    Breast cancer Neg Hx    Social History   Socioeconomic History   Marital status: Married    Spouse name: Not on file   Number of children: 2   Years of education: Not on file   Highest education level: Not on file  Occupational History   Occupation: Retired  Tobacco Use   Smoking status: Never   Smokeless tobacco: Never  Vaping Use   Vaping status: Never Used  Substance and Sexual Activity   Alcohol use: Not Currently   Drug use: No   Sexual activity: Yes  Other Topics Concern   Not on file  Social History Narrative   Not on file   Social Drivers of Health   Financial Resource Strain: Low Risk  (08/10/2023)   Overall Financial Resource Strain (CARDIA)    Difficulty of Paying Living Expenses: Not hard at all  Food Insecurity: No Food Insecurity (08/10/2023)   Hunger Vital Sign    Worried About Running Out of Food in the Last Year: Never true    Ran Out of Food in the Last Year: Never true  Transportation Needs: No Transportation Needs  (08/10/2023)   PRAPARE - Administrator, Civil Service (Medical):  No    Lack of Transportation (Non-Medical): No  Physical Activity: Inactive (08/10/2023)   Exercise Vital Sign    Days of Exercise per Week: 0 days    Minutes of Exercise per Session: 0 min  Stress: No Stress Concern Present (08/10/2023)   [employer] of Occupational Health - Occupational Stress Questionnaire    Feeling of Stress : Only a little  Social Connections: Moderately Integrated (08/10/2023)   Social Connection and Isolation Panel [NHANES]    Frequency of Communication with Friends and Family: More than three times a week    Frequency of Social Gatherings with Friends and Family: Never    Attends Religious Services: More than 4 times per year    Active Member of [employer] or Organizations: No    Attends Engineer, structural: Never    Marital Status: Married    Tobacco Counseling Counseling given: Not Answered   Clinical Intake:  Pre-visit preparation completed: Yes  Pain : No/denies pain   BMI - recorded: 25.77 Nutritional Status: BMI 25 -29 Overweight Nutritional Risks: None Diabetes: No  How often do you need to have someone help you when you read instructions, pamphlets, or other written materials from your doctor or pharmacy?: 1 - Never  Interpreter Needed?: No  Comments: lives with husband Information entered by :: B.Moe Graca,LPN   Activities of Daily Living    08/10/2023   10:40 AM 08/15/2022   10:32 AM  In your present state of health, do you have any difficulty performing the following activities:  Hearing? 0 1  Comment  Hearing aids  Vision? 0 0  Difficulty concentrating or making decisions? 0 --  Comment  Dx Mild Dementia. Taking medication as directed.  Walking or climbing stairs? 1 1  Comment  Paces self.  Dressing or bathing? 0 0  Doing errands, shopping? 0 0  Preparing Food and eating ? N N  Using the Toilet? N N  In the past six months, have you  accidently leaked urine? Y Y  Comment  Followed by PCP. Managed with PCP and daily pad/brief.  Do you have problems with loss of bowel control? N N  Managing your Medications? N N  Managing your Finances? N N  Housekeeping or managing your Housekeeping? N N    Patient Care Team: [name], MD as PCP - General (Family Medicine) End, Cristal Deer, MD as PCP - Cardiology (Cardiology) Lanier Prude, MD as PCP - Electrophysiology (Cardiology)  Indicate any recent Medical Services you may have received from other than [facility] providers in the past year (date may be approximate).     Assessment:   This is a routine wellness examination for Zarielle.  Hearing/Vision screen Hearing Screening - Comments:: Pt says she has hearing aids for years and hearing is adequate Vision Screening - Comments:: Pt says she wears glasses for driving [facility]   Goals Addressed               This Visit's Progress     Increase water intake (pt-stated)   Not on track      I will continue to drink at least 6-8 glasses of water daily.       Patient Stated   Not on track     05/27/2019, Patient wants to improve her strength and be able to walk better.      COMPLETED: Patient Stated   On track     08/14/2020, I will maintain and continue medications as prescribed.  Depression Screen    08/10/2023   10:36 AM 08/15/2022   10:40 AM 08/11/2021    2:08 PM 08/14/2020    8:24 AM 05/27/2019    9:16 AM 05/18/2018    8:38 AM 05/16/2017   11:30 AM  PHQ 2/9 Scores  PHQ - 2 Score 0 0 0 0 0 0 0  PHQ- 9 Score    0 0 0 2    Fall Risk    08/10/2023   10:31 AM 09/26/2022    9:58 AM 08/15/2022   10:41 AM 08/14/2020    8:21 AM 05/27/2019    9:16 AM  Fall Risk   Falls in the past year? 1 1 1 1  0  Comment    has knee pain and has went down a few times due to this   Number falls in past yr: 1 1 0 1 0  Injury with Fall? 0 0 0 0   Comment   Slipped in the mud. Did not seek medical care. Denies  injury.    Risk for fall due to : Impaired balance/gait;History of fall(s) History of fall(s) Impaired balance/gait Medication side effect Medication side effect  Follow up Education provided;Falls prevention discussed Falls evaluation completed Falls evaluation completed;Falls prevention discussed Falls evaluation completed;Falls prevention discussed Falls evaluation completed;Falls prevention discussed    MEDICARE RISK AT HOME: Medicare Risk at Home Any stairs in or around the home?: Yes If so, are there any without handrails?: Yes Home free of loose throw rugs in walkways, pet beds, electrical cords, etc?: Yes Adequate lighting in your home to reduce risk of falls?: Yes Life alert?: No Use of a cane, walker or w/c?: No Grab bars in the bathroom?: Yes Shower chair or bench in shower?: No Elevated toilet seat or a handicapped toilet?: Yes  TIMED UP AND GO:  Was the test performed?  No    Cognitive Function:    08/14/2020    8:30 AM 05/27/2019    9:19 AM 05/20/2018   11:54 AM 05/18/2018    8:39 AM 05/16/2017   11:28 AM  MMSE - Mini Mental State Exam  Orientation to time 5 5  5 5   Orientation to Place 5 5  5 5   Registration 3 3  3 3   Attention/ Calculation 5 5  0 0  Recall 3 3 3 3 3   Language- name 2 objects    0 0  Language- repeat 1 1  1 1   Language- follow 3 step command    3 3  Language- read & follow direction    0 0  Write a sentence    0 0  Copy design    0 0  Total score    20 20        08/10/2023   10:41 AM 08/15/2022   10:44 AM  6CIT Screen  What Year? 0 points 0 points  What month? 0 points 0 points  What time? 0 points 0 points  Count back from 20 0 points 0 points  Months in reverse 0 points 0 points  Repeat phrase 2 points 0 points  Total Score 2 points 0 points    Immunizations Immunization History  Administered Date(s) Administered   Fluad Quad(high Dose 65+) 07/29/2020, 05/26/2022   Fluad Trivalent(High Dose 65+) 06/15/2023   Influenza Split  05/11/2011   Influenza Whole 06/30/2010   Influenza, High Dose Seasonal PF 06/27/2015, 05/01/2018, 04/28/2019, 06/10/2021   Influenza,inj,Quad PF,6+ Mos 07/29/2013, 05/27/2016, 05/16/2017   PFIZER(Purple  Top)SARS-COV-2 Vaccination 09/02/2019, 09/23/2019, 06/02/2020   Pfizer Covid-19 Vaccine Bivalent Booster 11yrs & up 06/10/2021   Pneumococcal Conjugate-13 08/26/2015   Pneumococcal Polysaccharide-23 04/05/2010   Td 02/27/2019   Zoster Recombinant(Shingrix) 03/29/2018, 08/25/2018    TDAP status: Up to date  Flu Vaccine status: Up to date  Pneumococcal vaccine status: Up to date  Covid-19 vaccine status: Completed vaccines  Qualifies for Shingles Vaccine? Yes   Zostavax completed Yes   Shingrix Completed?: Yes  Screening Tests Health Maintenance  Topic Date Due   COVID-19 Vaccine (5 - 2024-25 season) 04/30/2023   Medicare Annual Wellness (AWV)  08/09/2024   DTaP/Tdap/Td (2 - Tdap) 02/26/2029   Pneumonia Vaccine 82+ Years old  Completed   INFLUENZA VACCINE  Completed   DEXA SCAN  Completed   Hepatitis C Screening  Completed   Zoster Vaccines- Shingrix  Completed   HPV VACCINES  Aged Out   Colonoscopy  Discontinued    Health Maintenance  Health Maintenance Due  Topic Date Due   COVID-19 Vaccine (5 - 2024-25 season) 04/30/2023    Colorectal cancer screening: No longer required.   Mammogram status: No longer required due to age.  Bone Density status: Completed 08/11/2017. Results reflect: Bone density results: NORMAL. Repeat every 5 years.  Lung Cancer Screening: (Low Dose CT Chest recommended if Age 1-80 years, 20 pack-year currently smoking OR have quit w/in 15years.) does not qualify.   Lung Cancer Screening Referral: no  Additional Screening:  Hepatitis C Screening: does not qualify; Completed 03/04/2016  Vision Screening: Recommended annual ophthalmology exams for early detection of glaucoma and other disorders of the eye. Is the patient up to date with  their annual eye exam?  Yes  Who is the provider or what is the name of the office in which the patient attends annual eye exams? Dr Alexia Freestone If pt is not established with a provider, would they like to be referred to a provider to establish care? No .   Dental Screening: Recommended annual dental exams for proper oral hygiene  Diabetic Foot Exam: n/a  Community Resource Referral / Chronic Care Management: CRR required this visit?  No   CCM required this visit?  No    Plan:     I have personally reviewed and noted the following in the patient's chart:   Medical and social history Use of alcohol, tobacco or illicit drugs  Current medications and supplements including opioid prescriptions. Patient is currently taking opioid prescriptions. Information provided to patient regarding non-opioid alternatives. Patient advised to discuss non-opioid treatment plan with their provider. Functional ability and status Nutritional status Physical activity Advanced directives List of other physicians Hospitalizations, surgeries, and ER visits in previous 12 months Vitals Screenings to include cognitive, depression, and falls Referrals and appointments  In addition, I have reviewed and discussed with patient certain preventive protocols, quality metrics, and best practice recommendations. A written personalized care plan for preventive services as well as general preventive health recommendations were provided to patient.    Sue Lush, LPN   [id number]/[id number]   After Visit Summary: (MyChart) Due to this being a telephonic visit, the after visit summary with patients personalized plan was offered to patient via MyChart   Nurse Notes: The patient states she is doing well and has no concerns or questions at this time.

## 2023-08-18 ENCOUNTER — Other Ambulatory Visit: Payer: Self-pay | Admitting: Family Medicine

## 2023-09-05 ENCOUNTER — Other Ambulatory Visit: Payer: Self-pay | Admitting: Family Medicine

## 2023-09-06 ENCOUNTER — Ambulatory Visit: Payer: Medicare HMO | Attending: Internal Medicine

## 2023-09-06 DIAGNOSIS — I480 Paroxysmal atrial fibrillation: Secondary | ICD-10-CM | POA: Diagnosis not present

## 2023-09-06 DIAGNOSIS — Z5181 Encounter for therapeutic drug level monitoring: Secondary | ICD-10-CM

## 2023-09-06 LAB — POCT INR: INR: 2.2 (ref 2.0–3.0)

## 2023-09-06 NOTE — Patient Instructions (Signed)
 CONTINUE 1 tablet daily except 1/2 tablet on Mondays and Fridays.  Repeat INR in 6 weeks [phone]

## 2023-09-13 ENCOUNTER — Other Ambulatory Visit: Payer: Self-pay | Admitting: Family Medicine

## 2023-10-01 NOTE — Progress Notes (Unsigned)
Deborah Woerner T. Rayyan Orsborn, MD, CAQ Sports Medicine [facility] at [facility] [address] [place], [address]  Phone: [phone]  FAX: [phone]  [name] - 78 y.o. female  MRN [id number]  Date of Birth: 1946/03/21  Date: 10/02/2023  PCP: [name], MD  Referral: [name], MD  No chief complaint on file.  Patient Care Team: [name], MD as PCP - General (Family Medicine) End, Cristal Deer, MD as PCP - Cardiology (Cardiology) Lanier Prude, MD as PCP - Electrophysiology (Cardiology) Pa, [facility] Subjective:   [name] is a 77 y.o. pleasant patient who presents with the following:  Health Maintenance Summary Reviewed and updated, unless pt declines services.  Tobacco History Reviewed. Non-smoker Alcohol: No concerns, no excessive use Exercise Habits: Some activity, rec at least 30 mins 5 times a week STD concerns: none Drug Use: None Lumps or breast concerns: no  She is a very pleasant patient, who I have known for very long time.  She presents today for complete physical exam after her prior Medicare wellness exam.  Patient does have chronic atrial fibrillation, and she does take amiodarone and warfarin.  She also is on warfarin for prior rheumatic valve replacement.  She does have congestive heart failure. Coreg 3.125 mg p.o. twice daily Lasix 20 mg a day Losartan 25 mg a day Crestor 10 mg a day  Thyroid: No symptoms. Labs reviewed. Denies cold / heat intolerance, dry skin, hair loss. No goiter.  Lab Results  Component Value Date   TSH 1.400 08/04/2023     Health Maintenance  Topic Date Due   COVID-19 Vaccine (5 - 2024-25 season) 04/30/2023   Medicare Annual Wellness (AWV)  08/09/2024   DTaP/Tdap/Td (2 - Tdap) 02/26/2029   Pneumonia Vaccine 30+ Years old  Completed   INFLUENZA VACCINE  Completed   DEXA SCAN  Completed   Hepatitis C Screening  Completed   Zoster Vaccines- Shingrix   Completed   HPV VACCINES  Aged Out   Colonoscopy  Discontinued    Immunization History  Administered Date(s) Administered   Fluad Quad(high Dose 65+) 07/29/2020, 05/26/2022   Fluad Trivalent(High Dose 65+) 06/15/2023   Influenza Split 05/11/2011   Influenza Whole 06/30/2010   Influenza, High Dose Seasonal PF 06/27/2015, 05/01/2018, 04/28/2019, 06/10/2021   Influenza,inj,Quad PF,6+ Mos 07/29/2013, 05/27/2016, 05/16/2017   PFIZER(Purple Top)SARS-COV-2 Vaccination 09/02/2019, 09/23/2019, 06/02/2020   Pfizer Covid-19 Vaccine Bivalent Booster 39yrs & up 06/10/2021   Pneumococcal Conjugate-13 08/26/2015   Pneumococcal Polysaccharide-23 04/05/2010   Td 02/27/2019   Zoster Recombinant(Shingrix) 03/29/2018, 08/25/2018   Patient Active Problem List   Diagnosis Date Noted   Paroxysmal atrial fibrillation (HCC) 03/04/2015    Priority: High   On amiodarone therapy 11/14/2014    Priority: High   Mild dementia (HCC) 10/24/2014    Priority: High   Chronic combined systolic and diastolic CHF, NYHA class 1 (HCC) 09/29/2014    Priority: High   NICM (nonischemic cardiomyopathy) (HCC) 09/29/2014    Priority: High   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, status post MVR 07/27/2009    Priority: High   Essential hypertension 07/12/2018    Priority: Medium    Current use of long term anticoagulation 11/14/2014    Priority: Medium    Hyperlipidemia LDL goal <100 04/05/2010    Priority: Medium    Hypothyroidism, unspecified 11/18/2008    Priority: Medium    Generalized anxiety disorder 11/18/2008    Priority: Medium  Major depressive disorder, recurrent episode, moderate with anxious distress (HCC) 11/18/2008    Priority: Medium    Aortic atherosclerosis (HCC) 12/02/2022   HFrEF (heart failure with reduced ejection fraction) (HCC) 07/30/2022   History of colonic polyps    GERD 04/05/2010   Mixed incontinence urge and stress 11/18/2008   NEPHROLITHIASIS, HX OF 11/18/2008     Past Medical History:  Diagnosis Date   Anxiety    Atrial fibrillation and flutter (HCC) 09/29/2014   Revereted from Afib RVR to 2:1 A Flutter -- TEE/ DCCV 2/9; on Amiodarone   Chronic combined systolic and diastolic CHF, NYHA class 1 (HCC) -- Systolic dysfunction resolved[I50.42] 09/29/2014   Exacerbation 09/2014 2/2 Afib/flutter with RVR - 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   a) [facility] Echo: EF ~10% Severe LV dilation & global LV Systolic dysfxn, restrictive filling pattern - Gr 3 DD, mod RV dysfxn, severe LA dilation - 6.4 cm, mod RA dil, mod pl effusion, mod MR, mild TR; b) TEE 10/07/14: EF ~10%, Severe LV dilation & dysfxn Mod RV dysfxn, LAA oversewn, Mod RA & TR   Diverticulosis    H/O: rheumatic fever    HYPERLIPIDEMIA 04/05/2010   Qualifier: Diagnosis of  By: Patsy Lager MD, Makeisha Jentsch     Hypothyroid    Migraine headache    Osteopenia    Paroxysmal atrial fibrillation (HCC) 2005; Recurrent 09/2014   a) 2005: s/p Cox Maze & LAA ligation; b) 09/2014: TEE-cardioversion   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  Posterior MV leaflet is fixed and immobile.  MAC.  reduced excursion of the anterior MV leaflet.  Mean MV gradient was 3mm Hg and MVA calculated at cm2.      Urinary incontinence     Past Surgical History:  Procedure Laterality Date   ABDOMINAL HYSTERECTOMY     CARDIAC CATHETERIZATION  Jan 2005   Pre-op R&LHC -- Nonobstructive CAD; normal PA pressures   CARDIOVERSION  06/04/2012   Procedure:  CARDIOVERSION;  Surgeon: Luis Abed, MD;  Location: [facility] ENDOSCOPY;  Service: Cardiovascular;  Laterality: N/A;   CARDIOVERSION N/A 10/07/2014   Procedure: CARDIOVERSION;  Surgeon: Quintella Reichert, MD;  Location: [facility] ENDOSCOPY;  Service: Cardiovascular;  Laterality: N/A;   CARDIOVERSION N/A 05/12/2023   Procedure: CARDIOVERSION;  Surgeon: Debbe Odea, MD;  Location: [facility] ORS;  Service: Cardiovascular;  Laterality: N/A;   COLONOSCOPY WITH PROPOFOL N/A 07/11/2017   Procedure: COLONOSCOPY WITH PROPOFOL;  Surgeon: Toledo, Boykin Nearing, MD;  Location: [facility] ENDOSCOPY;  Service: Gastroenterology;  Laterality: N/A;   COLONOSCOPY WITH PROPOFOL N/A 05/17/2022   Procedure: COLONOSCOPY WITH PROPOFOL;  Surgeon: Midge Minium, MD;  Location: [facility] ENDOSCOPY;  Service: Endoscopy;  Laterality: N/A;   LUMBAR DISC SURGERY     x 2 distantly   MITRAL VALVE REPAIR  2005   with Cox Maze for [employer]   NM MYOVIEW LTD  4/7/'16   EF 37% with septal hypokinesis and diffuse hypokinesis. No ischemia or infarction. Read as "intermediate risk "secondary to decreased EF  consistent with nonischemic cardiac myopathy.   RIGHT HEART CATHETERIZATION N/A 10/03/2014   Procedure: RIGHT HEART CATH;  Surgeon: Dolores Patty, MD;  Location: [facility] CATH LAB;  Service: Cardiovascular;  RAP , RVP 35/2/9 mmHg, PAP 45/12 mmHg, PCWP 16 mmHg; CO/I by Fick: 3.9/2.3; Ao/PA/SVC SaO2%: 97%/63%/65%.   TEE WITHOUT CARDIOVERSION N/A 10/07/2014   Procedure: TRANSESOPHAGEAL ECHOCARDIOGRAM (TEE);  Surgeon: Quintella Reichert, MD;  Location: [facility] ENDOSCOPY;  Service: Cardiovascular;  Laterality: N/A;   THYROIDECTOMY, PARTIAL     partial-no cancer; now on thyroid replacement   TRANSTHORACIC ECHOCARDIOGRAM  2013; 2/'16 & 5/'16   a) 2013: EF 60-65%, mild MR, Gr 2 DD; b) 2/'16 @ [facility]: EF ~10% - severel global LV dysfunction (systolic & diastolic) - dilated LV & restrictive filling pattern - Gr 3 DD, mod reduced RV function, severely dilated LA - 6.4 cm, mod dilated  RA, mod pl effusion, mod MR, mild TR; c) 5/10/'16:  EF 25-30%, mod LV dilation, no RWMA,Gr 3 DD w/ high LAP, MV sewing ring intact w/ Mod MR, Severe LA dilation   TRANSTHORACIC ECHOCARDIOGRAM  January 2017:   EF improved to 45-50%. Mild diffuse HK. Mild MR. Moderate LA dilation. Normal PA pressures.    Family History  Problem Relation Age of Onset   Diabetes Mother    Coronary artery disease Mother    Kidney disease Mother    Breast cancer Neg Hx     Social History   Social History Narrative   Not on file    Past Medical History, Surgical History, Social History, Family History, Problem List, Medications, and Allergies have been reviewed and updated if relevant.  Review of Systems: Pertinent positives are listed above.  Otherwise, a full 14 point review of systems has been done in full and it is negative except where it is noted positive.  Objective:   There were no vitals taken for this visit. Ideal Body Weight:   No results found.    08/10/2023   10:36 AM 08/15/2022   10:40 AM 08/11/2021    2:08 PM 08/14/2020    8:24 AM 05/27/2019    9:16 AM  Depression screen PHQ 2/9  Decreased Interest 0 0 0 0 0  Down, Depressed, Hopeless 0 0 0 0 0  PHQ - 2 Score 0 0 0 0 0  Altered sleeping    0 0  Tired, decreased energy    0 0  Change in appetite    0 0  Feeling bad or failure about yourself     0 0  Trouble concentrating    0 0  Moving slowly or fidgety/restless    0 0  Suicidal thoughts    0 0  PHQ-9 Score    0 0  Difficult doing work/chores    Not difficult at all Not difficult at all     GEN: well developed, well nourished, no acute distress Eyes: conjunctiva and lids normal, PERRLA, EOMI ENT: TM clear, nares clear, oral exam WNL Neck: supple, no lymphadenopathy, no thyromegaly, no JVD Pulm: clear to auscultation and percussion, respiratory effort normal CV: regular rate and rhythm, S1-S2, no murmur, rub or gallop, no bruits Chest: no scars, masses, no lumps BREAST:  breast exam declined GI: soft, non-tender; no hepatosplenomegaly, masses; active bowel sounds all quadrants GU: GU exam declined Lymph: no cervical, axillary or inguinal adenopathy MSK: gait normal, muscle tone and strength WNL, no joint swelling, effusions, discoloration, crepitus  SKIN: clear, good turgor, color WNL, no  rashes, lesions, or ulcerations Neuro: normal mental status, normal strength, sensation, and motion Psych: alert; oriented to person, place and time, normally interactive and not anxious or depressed in appearance.   All labs reviewed with patient. Results for orders placed or performed in visit on 09/06/23  POCT INR   Collection Time: 09/06/23 10:47 AM  Result Value Ref Range   INR 2.2 2.0 - 3.0   POC INR     *Note: Due to a large number of results and/or encounters for the requested time period, some results have not been displayed. A complete set of results can be found in Results Review.   No results found.  Assessment and Plan:     ICD-10-CM   1. Healthcare maintenance  Z00.00       Health Maintenance Exam: The patient's preventative maintenance and recommended screening tests for an annual wellness exam were reviewed in full today. Brought up to date unless services declined.  Counselled on the importance of diet, exercise, and its role in overall health and mortality. The patient's FH and SH was reviewed, including their home life, tobacco status, and drug and alcohol status.  Follow-up in 1 year for physical exam or additional follow-up below.  Disposition: No follow-ups on file.  Future Appointments  Date Time Provider Department Center  10/02/2023 11:00 AM Brody Bonneau, Karleen Hampshire, MD [facility]  10/18/2023 11:00 AM [facility] COUMADIN [facility] None  08/12/2024 10:10 AM [facility] ANNUAL WELLNESS VISIT 1 [facility]    No orders of the defined types were placed in this encounter.  There are no discontinued medications. No orders of the defined types  were placed in this encounter.   Signed,  Elpidio Galea. Preslynn Bier, MD   Allergies as of 10/02/2023   No Known Allergies      Medication List        Accurate as of October 01, 2023 12:39 PM. If you have any questions, ask your nurse or doctor.          acetaminophen 500 MG tablet Commonly known as: TYLENOL Take 1,000 mg by mouth every 8 (eight) hours as needed.   amiodarone 200 MG tablet Commonly known as: Pacerone Take 2 tablets (400 mg total) by mouth 2 (two) times daily for 7 days, THEN 1 tablet (200 mg total) 2 (two) times daily for 7 days. Start taking on: April 06, 2023 What changed: Another medication with the same name was changed. Make sure you understand how and when to take each.   amiodarone 200 MG tablet Commonly known as: PACERONE After 2 week taper completed then start Amiodarone 200 mg daily What changed:  how much to take how to take this when to take this   BIOTIN PO Take 1 tablet by mouth daily.   CALCIUM + D PO Take 1 capsule by mouth daily.   CALCIUM 600 PO Take 1 tablet by mouth daily.   carvedilol 3.125 MG tablet Commonly known as: COREG Take 1 tablet (3.125 mg total) by mouth 2 (two) times daily.   cefdinir 300 MG capsule Commonly known as: OMNICEF Take 2 capsules (600 mg total) by mouth daily.   cetirizine 10 MG tablet Commonly known as: ZYRTEC Take 10 mg by mouth daily.   cyanocobalamin 1000 MCG tablet Commonly known as: VITAMIN B12 Take 1,000 mcg by mouth daily.   donepezil 5 MG tablet Commonly known as: ARICEPT TAKE ONE TABLET BY MOUTH DAILY AT BEDTIME   FISH OIL PO Take 1 capsule by mouth daily.  fluticasone 50 MCG/ACT nasal spray Commonly known as: FLONASE PLACE TWO SPRAYS INTO BOTH NOSTRILS DAILY   furosemide 20 MG tablet Commonly known as: LASIX Take 1 tablet (20 mg total) by mouth daily.   guaiFENesin-codeine 100-10 MG/5ML syrup Take 5 mLs by mouth at bedtime as needed for cough.   IRON (FERROUS SULFATE)  PO Take 1 tablet by mouth daily.   levothyroxine 75 MCG tablet Commonly known as: SYNTHROID TAKE ONE TAB BY MOUTH ONCE DAILY. TAKE ON AN EMPTY STOMACH WITH A GLASS OF WATER ATLEAST 30-60 MINUTES BEFORE BREAKFAST   losartan 25 MG tablet Commonly known as: COZAAR TAKE ONE TABLET BY MOUTH ONCE DAILY   Melatonin 5 MG Caps Take 1 capsule by mouth at bedtime.   metroNIDAZOLE 0.75 % cream Commonly known as: METROCREAM Apply topically 2 (two) times daily.   pantoprazole 40 MG tablet Commonly known as: PROTONIX TAKE ONE TABLET BY MOUTH DAILY   permethrin 5 % cream Commonly known as: ELIMITE APPLY PEA SIZED AMOUNT TO FACE ONCE A DAY   rosuvastatin 10 MG tablet Commonly known as: CRESTOR TAKE ONE TABLET BY MOUTH ONCE DAILY   traZODone 50 MG tablet Commonly known as: DESYREL TAKE ONE HALF (1/2) TO ONE TABLET BY MOUTH AT BEDTIME AS NEEDED FOR SLEEP.   Vitamin D-3 125 MCG (5000 UT) Tabs Take 1 tablet by mouth daily.   warfarin 5 MG tablet Commonly known as: COUMADIN Take as directed by the anticoagulation clinic. If you are unsure how to take this medication, talk to your nurse or doctor. Original instructions: TAKE ONE HALF (1/2) TO ONE TABLET BY MOUTH DAILY AS DIRECTED BY COUMADIN CLINIC

## 2023-10-02 ENCOUNTER — Ambulatory Visit (INDEPENDENT_AMBULATORY_CARE_PROVIDER_SITE_OTHER): Payer: Medicare HMO | Admitting: Family Medicine

## 2023-10-02 ENCOUNTER — Encounter: Payer: Self-pay | Admitting: Family Medicine

## 2023-10-02 VITALS — BP 132/80 | HR 53 | Temp 98.4°F | Ht 67.0 in | Wt 162.4 lb

## 2023-10-02 DIAGNOSIS — Z0000 Encounter for general adult medical examination without abnormal findings: Secondary | ICD-10-CM

## 2023-10-02 MED ORDER — GEMTESA 75 MG PO TABS: 1.0000 | ORAL_TABLET | Freq: Every day | ORAL | 1 refills | Status: DC

## 2023-10-02 NOTE — Patient Instructions (Signed)
Covid - 19 vaccine  RSV (Abrelvy) vaccine

## 2023-10-10 ENCOUNTER — Other Ambulatory Visit: Payer: Self-pay | Admitting: Family Medicine

## 2023-10-13 ENCOUNTER — Other Ambulatory Visit: Payer: Self-pay | Admitting: Family Medicine

## 2023-10-18 ENCOUNTER — Ambulatory Visit: Payer: Medicare HMO

## 2023-10-25 ENCOUNTER — Ambulatory Visit: Payer: Medicare HMO | Attending: Internal Medicine

## 2023-10-25 DIAGNOSIS — I480 Paroxysmal atrial fibrillation: Secondary | ICD-10-CM

## 2023-10-25 DIAGNOSIS — Z5181 Encounter for therapeutic drug level monitoring: Secondary | ICD-10-CM

## 2023-10-25 LAB — POCT INR: INR: 2.1 (ref 2.0–3.0)

## 2023-10-25 NOTE — Patient Instructions (Signed)
 CONTINUE 1 tablet daily except 1/2 tablet on Mondays and Fridays.  Repeat INR in 6 weeks [phone]

## 2023-10-31 ENCOUNTER — Other Ambulatory Visit: Payer: Self-pay | Admitting: Cardiology

## 2023-10-31 DIAGNOSIS — I4819 Other persistent atrial fibrillation: Secondary | ICD-10-CM

## 2023-11-14 ENCOUNTER — Other Ambulatory Visit: Payer: Self-pay | Admitting: Family Medicine

## 2023-11-25 ENCOUNTER — Other Ambulatory Visit: Payer: Self-pay | Admitting: Family Medicine

## 2023-11-27 MED ORDER — LEVOTHYROXINE SODIUM 75 MCG PO TABS
ORAL_TABLET | ORAL | 3 refills | Status: AC
Start: 2023-11-27 — End: ?

## 2023-11-27 NOTE — Addendum Note (Signed)
 Addended by: Damita Lack on: 11/27/2023 10:02 AM   Modules accepted: Orders

## 2023-11-27 NOTE — Telephone Encounter (Signed)
 Electronic refill failed transmission x 2.  Refill called in to [facility] pharmacy.

## 2023-12-06 ENCOUNTER — Ambulatory Visit: Payer: Medicare HMO | Attending: Internal Medicine

## 2023-12-06 DIAGNOSIS — Z5181 Encounter for therapeutic drug level monitoring: Secondary | ICD-10-CM

## 2023-12-06 DIAGNOSIS — I480 Paroxysmal atrial fibrillation: Secondary | ICD-10-CM | POA: Diagnosis not present

## 2023-12-06 LAB — POCT INR: INR: 2.1 (ref 2.0–3.0)

## 2023-12-06 NOTE — Patient Instructions (Signed)
 CONTINUE 1 tablet daily except 1/2 tablet on Mondays and Fridays.  Repeat INR in 6 weeks [phone]

## 2023-12-22 ENCOUNTER — Other Ambulatory Visit: Payer: Self-pay | Admitting: Family Medicine

## 2023-12-25 ENCOUNTER — Other Ambulatory Visit: Payer: Self-pay | Admitting: Family Medicine

## 2024-01-11 ENCOUNTER — Ambulatory Visit: Admitting: Family Medicine

## 2024-01-11 ENCOUNTER — Ambulatory Visit (INDEPENDENT_AMBULATORY_CARE_PROVIDER_SITE_OTHER)
Admission: RE | Admit: 2024-01-11 | Discharge: 2024-01-11 | Disposition: A | Discharge status: Home / Self Care | Source: Ambulatory Visit | Attending: Family Medicine | Admitting: Family Medicine

## 2024-01-11 ENCOUNTER — Encounter: Payer: Self-pay | Admitting: Family Medicine

## 2024-01-11 VITALS — BP 120/60 | HR 60 | Temp 98.1°F | Ht 67.0 in

## 2024-01-11 DIAGNOSIS — M25561 Pain in right knee: Secondary | ICD-10-CM

## 2024-01-11 DIAGNOSIS — M1711 Unilateral primary osteoarthritis, right knee: Secondary | ICD-10-CM

## 2024-01-11 MED ORDER — TRAMADOL HCL 50 MG PO TABS: 50.0000 mg | ORAL_TABLET | Freq: Three times a day (TID) | ORAL | 0 refills | Status: DC | PRN

## 2024-01-11 NOTE — Progress Notes (Signed)
 Kana Reimann T. Laure Leone, MD, CAQ Sports Medicine [facility] at [facility] [address] [place], [address]  Phone: [phone]  FAX: [phone]  JANAE TAUCHER - 77 y.o. female  MRN [id number]  Date of Birth: 1945-11-08  Date: 01/11/2024  PCP: Scherrie Curt, MD  Referral: Scherrie Curt, MD  Chief Complaint  Patient presents with   Knee Pain    Right-stepped in hole and twisted knee 2 weeks ago   Subjective:   [name] is a 78 y.o. very pleasant female patient with Body mass index is 25.43 kg/m. who presents with the following:  She is a very well-known patient who I have known for many years.  She has known significant osteoarthritis as well as longstanding chondrocalcinosis.  About 2 weeks ago she was walking and stepped in an uneven area and caught herself and twisted her right knee.  Since then, she has had extensive right-sided medial knee pain.  She has been taking about 4 g of Tylenol  a day.  She is on warfarin chronically, so she has not been taking any NSAIDs.  She has been having trouble walking around in the office, so to use a wheelchair to get her over to x-ray department.  No prior history of knee surgery in the past.  Review of Systems is noted in the HPI, as appropriate  Objective:   BP 120/60   Pulse 60   Temp 98.1 F (36.7 C) (Temporal)   Ht 5\' 7"  (1.702 m)   SpO2 97%   BMI 25.43 kg/m   GEN: No acute distress; alert,appropriate. PULM: Breathing comfortably in no respiratory distress PSYCH: Normally interactive.   Right knee: She lacks 5 degrees of extension.  Flexion to 97 degrees Mild effusion No significant tenderness with palpation of patella, there is significant crepitus with movement She has significant right sided medial greater than lateral joint line tenderness There are some mild tenderness at the Pez anserine bursa Stable to varus and valgus stress ACL PCL are intact Any kind of forced flexion  or extension causes significant pain  Laboratory and Imaging Data:  Assessment and Plan:     ICD-10-CM   1. Acute pain of right knee  M25.561 DG Knee 4 Views W/Patella Right    2. Primary osteoarthritis of right knee  M17.11 DG Knee 4 Views W/Patella Right     Acute knee pain in the setting of significant right-sided osteoarthritis of the knee.  Twisting injury, more likely degenerative component of meniscal tearing.  I am going to placed her in a patellar J brace, have her use a walker for 2 weeks and then follow-up with me in 3 weeks.  She can use some tramadol  as needed  Medication Management during today's office visit: Meds ordered this encounter  Medications   traMADol  (ULTRAM ) 50 MG tablet    Sig: Take 1 tablet (50 mg total) by mouth every 8 (eight) hours as needed for moderate pain (pain score 4-6).    Dispense:  20 tablet    Refill:  0   Medications Discontinued During This Encounter  Medication Reason   nitrofurantoin , macrocrystal-monohydrate, (MACROBID ) 100 MG capsule Completed Course   Vibegron  (GEMTESA ) 75 MG TABS Completed Course    Orders placed today for conditions managed today: Orders Placed This Encounter  Procedures   DG Knee 4 Views W/Patella Right    Disposition: No follow-ups on file.  [employer] speech-to-text software was used for transcription in  this dictation.  Possible transcriptional errors can occur using Animal nutritionist.   Signed,  Ranny Bye. Alvilda Mckenna, MD   Outpatient Encounter Medications as of 01/11/2024  Medication Sig   acetaminophen  (TYLENOL ) 500 MG tablet Take 1,000 mg by mouth every 8 (eight) hours as needed.   amiodarone  (PACERONE ) 200 MG tablet Take 100 mg by mouth daily.   BIOTIN PO Take 1 tablet by mouth daily.   Calcium  Carbonate (CALCIUM  600 PO) Take 1 tablet by mouth daily.   Calcium  Citrate-Vitamin D  (CALCIUM  + D PO) Take 1 capsule by mouth daily.   carvedilol  (COREG ) 3.125 MG tablet TAKE ONE TABLET (3.125 MG  TOTAL) BY MOUTH TWICE DAILY.   cetirizine (ZYRTEC) 10 MG tablet Take 10 mg by mouth daily.   Cholecalciferol (VITAMIN D -3) 125 MCG (5000 UT) TABS Take 1 tablet by mouth daily.   cyanocobalamin  (VITAMIN B12) 1000 MCG tablet Take 1,000 mcg by mouth daily.   donepezil  (ARICEPT ) 5 MG tablet TAKE ONE TABLET BY MOUTH DAILY AT BEDTIME   fluticasone  (FLONASE ) 50 MCG/ACT nasal spray PLACE TWO SPRAYS INTO BOTH NOSTRILS DAILY   IRON, FERROUS SULFATE, PO Take 1 tablet by mouth daily.   levothyroxine  (SYNTHROID ) 75 MCG tablet TAKE ONE TAB BY MOUTH ONCE DAILY. TAKE ON AN EMPTY STOMACH WITH A GLASS OF WATER ATLEAST 30-60 MINUTES BEFORE BREAKFAST   losartan  (COZAAR ) 25 MG tablet TAKE ONE TABLET BY MOUTH ONCE DAILY   Melatonin 5 MG CAPS Take 1 capsule by mouth at bedtime.   metroNIDAZOLE  (METROCREAM ) 0.75 % cream Apply topically 2 (two) times daily.   Omega-3 Fatty Acids (FISH OIL PO) Take 1 capsule by mouth daily.   pantoprazole  (PROTONIX ) 40 MG tablet TAKE ONE TABLET BY MOUTH DAILY   permethrin (ELIMITE) 5 % cream APPLY PEA SIZED AMOUNT TO FACE ONCE A DAY   rosuvastatin  (CRESTOR ) 10 MG tablet TAKE ONE TABLET BY MOUTH ONCE DAILY   traMADol  (ULTRAM ) 50 MG tablet Take 1 tablet (50 mg total) by mouth every 8 (eight) hours as needed for moderate pain (pain score 4-6).   traZODone  (DESYREL ) 50 MG tablet TAKE ONE HALF (1/2) TO ONE TABLET BY MOUTH AT BEDTIME AS NEEDED FOR SLEEP.   warfarin (COUMADIN ) 5 MG tablet TAKE ONE HALF (1/2) TO ONE TABLET BY MOUTH DAILY AS DIRECTED BY COUMADIN  CLINIC   [DISCONTINUED] nitrofurantoin , macrocrystal-monohydrate, (MACROBID ) 100 MG capsule Take 100 mg by mouth daily as needed.   [DISCONTINUED] Vibegron  (GEMTESA ) 75 MG TABS Take 1 tablet (75 mg total) by mouth daily.   No facility-administered encounter medications on file as of 01/11/2024.

## 2024-01-11 NOTE — Patient Instructions (Signed)
 Hip Flexion: Toe up to ceiling, laying on your back. Lift your whole leg, 3 sets. Work up to being able to do #30 with each set.  Hip elevations, Toe and leg turned out to side.  Lift whole leg, 3 sets. Work up to being able to do #30 with each set.  Hip Abductions: Lying on side, straight out to side. 3 sets, work up to being able to do #30 with each set.  At the beginning you may only be able to do a lot less, try to do #10.

## 2024-01-17 ENCOUNTER — Ambulatory Visit: Attending: Internal Medicine

## 2024-01-17 DIAGNOSIS — I480 Paroxysmal atrial fibrillation: Secondary | ICD-10-CM

## 2024-01-17 LAB — POCT INR: INR: 1.9 — AB (ref 2.0–3.0)

## 2024-01-17 NOTE — Patient Instructions (Signed)
 Description   Take 1.5 tablets today, then resume same dosage of Warfarin 1 tablet daily except 1/2 tablet on Mondays and Fridays.  Repeat INR in 6 weeks [phone]

## 2024-01-27 ENCOUNTER — Other Ambulatory Visit: Payer: Self-pay | Admitting: Cardiology

## 2024-01-27 DIAGNOSIS — I4819 Other persistent atrial fibrillation: Secondary | ICD-10-CM

## 2024-01-29 ENCOUNTER — Ambulatory Visit: Admitting: Nurse Practitioner

## 2024-02-04 NOTE — Progress Notes (Unsigned)
     Verlyn Lambert T. Shontelle Muska, MD, CAQ Sports Medicine [facility] at [facility] [address] [place], [address]  Phone: [phone]  FAX: [phone]  TANESHIA LORENCE - 78 y.o. female  MRN [id number]  Date of Birth: 1946/05/27  Date: 02/05/2024  PCP: Scherrie Curt, MD  Referral: Scherrie Curt, MD  No chief complaint on file.  Subjective:   ANGELES ZEHNER is a 78 y.o. very pleasant female patient with There is no height or weight on file to calculate BMI. who presents with the following:  Mrs. Vanda Gemma is a very nice lady who I have known for many years, she presents with follow-up right-sided knee pain.  I last saw on her Jan 11, 2024.  At that point, she had had a twisting injury to the right knee.  I placed her in a patellar J brace and had her go on a walker for 2 weeks and then titrate now.  She is here today in follow-up regarding her right-sided knee pain.  She does have some underlying arthritis of the knee.  Review of Systems is noted in the HPI, as appropriate  Objective:   There were no vitals taken for this visit.  GEN: No acute distress; alert,appropriate. PULM: Breathing comfortably in no respiratory distress PSYCH: Normally interactive.   Laboratory and Imaging Data:  Assessment and Plan:   ***

## 2024-02-05 ENCOUNTER — Ambulatory Visit (INDEPENDENT_AMBULATORY_CARE_PROVIDER_SITE_OTHER): Admitting: Family Medicine

## 2024-02-05 ENCOUNTER — Encounter: Payer: Self-pay | Admitting: Family Medicine

## 2024-02-05 VITALS — BP 100/60 | HR 57 | Temp 97.7°F | Ht 67.0 in | Wt 159.1 lb

## 2024-02-05 DIAGNOSIS — M25561 Pain in right knee: Secondary | ICD-10-CM

## 2024-02-05 DIAGNOSIS — M1711 Unilateral primary osteoarthritis, right knee: Secondary | ICD-10-CM | POA: Diagnosis not present

## 2024-02-05 MED ORDER — TRIAMCINOLONE ACETONIDE 40 MG/ML IJ SUSP
40.0000 mg | Freq: Once | INTRAMUSCULAR | Status: AC
  Administered 2024-02-05: 40 mg via INTRA_ARTICULAR

## 2024-02-08 ENCOUNTER — Other Ambulatory Visit: Payer: Self-pay | Admitting: Family Medicine

## 2024-02-09 ENCOUNTER — Other Ambulatory Visit: Payer: Self-pay | Admitting: Family Medicine

## 2024-02-09 DIAGNOSIS — Z1231 Encounter for screening mammogram for malignant neoplasm of breast: Secondary | ICD-10-CM

## 2024-02-12 ENCOUNTER — Encounter

## 2024-02-14 ENCOUNTER — Encounter

## 2024-02-19 ENCOUNTER — Ambulatory Visit (INDEPENDENT_AMBULATORY_CARE_PROVIDER_SITE_OTHER)
Admission: RE | Admit: 2024-02-19 | Discharge: 2024-02-19 | Disposition: A | Discharge status: Home / Self Care | Source: Ambulatory Visit | Attending: General Practice | Admitting: General Practice

## 2024-02-19 ENCOUNTER — Ambulatory Visit (INDEPENDENT_AMBULATORY_CARE_PROVIDER_SITE_OTHER): Admitting: General Practice

## 2024-02-19 ENCOUNTER — Ambulatory Visit: Payer: Self-pay | Admitting: General Practice

## 2024-02-19 ENCOUNTER — Ambulatory Visit: Payer: Self-pay

## 2024-02-19 ENCOUNTER — Telehealth: Payer: Self-pay | Admitting: *Deleted

## 2024-02-19 ENCOUNTER — Encounter: Payer: Self-pay | Admitting: General Practice

## 2024-02-19 VITALS — BP 122/60 | HR 55 | Temp 98.3°F | Wt 155.0 lb

## 2024-02-19 DIAGNOSIS — R0602 Shortness of breath: Secondary | ICD-10-CM | POA: Diagnosis not present

## 2024-02-19 DIAGNOSIS — R051 Acute cough: Secondary | ICD-10-CM | POA: Diagnosis not present

## 2024-02-19 DIAGNOSIS — J189 Pneumonia, unspecified organism: Secondary | ICD-10-CM

## 2024-02-19 LAB — POCT INFLUENZA A/B
Influenza A, POC: NEGATIVE
Influenza B, POC: NEGATIVE

## 2024-02-19 LAB — POC COVID19 BINAXNOW: SARS Coronavirus 2 Ag: NEGATIVE

## 2024-02-19 MED ORDER — DOXYCYCLINE HYCLATE 100 MG PO TABS: 100.0000 mg | ORAL_TABLET | Freq: Two times a day (BID) | ORAL | 0 refills | Status: DC

## 2024-02-19 MED ORDER — BENZONATATE 200 MG PO CAPS: 200.0000 mg | ORAL_CAPSULE | Freq: Three times a day (TID) | ORAL | 0 refills | Status: AC | PRN

## 2024-02-19 NOTE — Telephone Encounter (Signed)
 Spoke with [name].  She was calling about her CXR results.  I let her know we were waiting on the results to be reviewed by [facility].  She just wants to make sure she is called with results and not MyCharted.

## 2024-02-19 NOTE — Assessment & Plan Note (Signed)
 Poc covid and flu negative.  Suspect viral vs bacterial URI and/or pneumonia.  Wheezing auscultated on exam. Stable for out patient treatment. Given her presentation, will get chest x-ray STAT.   Discussed symptoms and treatment options at length.  Encourage rest, increase fluid intake and Humidifier.  Start Benzonatate  capsules for cough. Take 1 capsule by mouth three times daily as needed for cough. Start mucinex  (plain) twice daily as needed.  Continue tylenol  and delsym as needed. Await results. ER precautions provided.

## 2024-02-19 NOTE — Progress Notes (Signed)
 Established Patient Office Visit  Subjective   Patient ID: [name], female    DOB: 1946-05-16  Age: 78 y.o. MRN: [id number]  Chief Complaint  Patient presents with   Cough    With fever and fatigue on Saturday; cough got worse since; gets wheezy when she lays down. Productive cough with dark yellow phlegm yesterday but little better today. Patient has been taking tylenol  and delsym cough medication.      Cough Associated symptoms include chills, a fever and wheezing. Pertinent negatives include no chest pain, ear pain, headaches, heartburn, sore throat or shortness of breath.    [name] is a 78 year old female, patient of Dr. Watt, with past medical history of CHF, NICM, Afib, HTN, Rheumatic mitral valve disease, GERD, hypothyroidism, HLD, GAD, depression presents today for an acute visit.   Cough: Symptom onset was Saturday with fever (99-100F), chills, fatigue, cough, nasal congestion, post nasal drip, loose stools. Chest tightness with wheezing when lying down.  Productive cough since yesterday with dark yellow phlegm.  Has tried tylenol , flonase  and delsym with minimal relief.  No known exposure to covid or flu.  Overall, feeling a little better today.   No hx of smoking.  No known hx of asthma or COPD.  UTD on covid and influenza vaccine.   Patient Active Problem List   Diagnosis Date Noted   Aortic atherosclerosis (HCC) 12/02/2022   Shortness of breath 07/30/2022   HFrEF (heart failure with reduced ejection fraction) (HCC) 07/30/2022   History of colonic polyps    Acute cough 08/19/2021   Essential hypertension 07/12/2018   Paroxysmal atrial fibrillation (HCC) 03/04/2015   On amiodarone  therapy 11/14/2014   Current use of long term anticoagulation 11/14/2014   Mild dementia (HCC) 10/24/2014   Chronic combined systolic and diastolic CHF, NYHA class 1 (HCC) 09/29/2014   NICM (nonischemic cardiomyopathy) (HCC) 09/29/2014   Hyperlipidemia LDL goal <100  04/05/2010   GERD 04/05/2010   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, status post MVR 07/27/2009   Hypothyroidism, unspecified 11/18/2008   Generalized anxiety disorder 11/18/2008   Major depressive disorder, recurrent episode, moderate with anxious distress (HCC) 11/18/2008   Mixed incontinence urge and stress 11/18/2008   NEPHROLITHIASIS, HX OF 11/18/2008   Past Medical History:  Diagnosis Date   Anxiety    Atrial fibrillation and flutter (HCC) 09/29/2014   Revereted from Afib RVR to 2:1 A Flutter -- TEE/ DCCV 2/9; on Amiodarone    Chronic combined systolic and diastolic CHF, NYHA class 1 (HCC) -- Systolic dysfunction resolved[I50.42] 09/29/2014   Exacerbation 09/2014 2/2 Afib/flutter with RVR - 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   a) [facility] Echo: EF ~10% Severe LV dilation & global LV Systolic dysfxn, restrictive filling pattern - Gr 3 DD, mod RV dysfxn, severe LA dilation - 6.4 cm, mod RA dil, mod pl effusion, mod MR, mild TR; b) TEE 10/07/14: EF ~10%, Severe LV dilation & dysfxn Mod RV dysfxn, LAA oversewn, Mod RA & TR   Diverticulosis    H/O: rheumatic fever    HYPERLIPIDEMIA 04/05/2010   Qualifier: Diagnosis of  By: [name] MD, Spencer     Hypothyroid    Migraine headache  Osteopenia    Paroxysmal atrial fibrillation (HCC) 2005; Recurrent 09/2014   a) 2005: s/p Cox Maze & LAA ligation; b) 09/2014: TEE-cardioversion   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  Posterior MV leaflet is fixed and  immobile.  MAC.  reduced excursion of the anterior MV leaflet.  Mean MV gradient was 3mm Hg and MVA calculated at cm2.      Urinary incontinence    Past Surgical History:  Procedure Laterality Date   ABDOMINAL HYSTERECTOMY     CARDIAC CATHETERIZATION  Jan 2005   Pre-op R&LHC -- Nonobstructive CAD; normal PA pressures   CARDIOVERSION  06/04/2012   Procedure: CARDIOVERSION;  Surgeon: Reyes JONETTA Forget, MD;  Location: [facility] ENDOSCOPY;  Service: Cardiovascular;  Laterality: N/A;   CARDIOVERSION N/A 10/07/2014   Procedure: CARDIOVERSION;  Surgeon: Wilbert JONELLE Bihari, MD;  Location: [facility] ENDOSCOPY;  Service: Cardiovascular;  Laterality: N/A;   CARDIOVERSION N/A 05/12/2023   Procedure: CARDIOVERSION;  Surgeon: Darliss Rogue, MD;  Location: [facility] ORS;  Service: Cardiovascular;  Laterality: N/A;   COLONOSCOPY WITH PROPOFOL  N/A 07/11/2017   Procedure: COLONOSCOPY WITH PROPOFOL ;  Surgeon: Toledo, Ladell POUR, MD;  Location: [facility] ENDOSCOPY;  Service: Gastroenterology;  Laterality: N/A;   COLONOSCOPY WITH PROPOFOL  N/A 05/17/2022   Procedure: COLONOSCOPY WITH PROPOFOL ;  Surgeon: Jinny Carmine, MD;  Location[facility] ENDOSCOPY;  Service: Endoscopy;  Laterality: N/A;   LUMBAR DISC SURGERY     x 2 distantly   MITRAL VALVE REPAIR  2005   with Cox Maze for [employer]   NM MYOVIEW LTD  4/7/'16   EF 37% with septal hypokinesis and diffuse hypokinesis. No ischemia or infarction. Read as intermediate risk secondary to decreased EF consistent with nonischemic cardiac myopathy.   RIGHT HEART CATHETERIZATION N/A 10/03/2014   Procedure: RIGHT HEART CATH;  Surgeon: Toribio JONELLE Fuel, MD;  Location: [facility] CATH LAB;  Service: Cardiovascular;  RAP , RVP 35/2/9 mmHg, PAP 45/12 mmHg, PCWP 16 mmHg; CO/I by Fick: 3.9/2.3; Ao/PA/SVC SaO2%: 97%/63%/65%.   TEE WITHOUT CARDIOVERSION N/A 10/07/2014   Procedure: TRANSESOPHAGEAL ECHOCARDIOGRAM (TEE);  Surgeon: Wilbert JONELLE Bihari, MD;  Location: [facility] ENDOSCOPY;  Service: Cardiovascular;  Laterality: N/A;    THYROIDECTOMY, PARTIAL     partial-no cancer; now on thyroid  replacement   TRANSTHORACIC ECHOCARDIOGRAM  2013; 2/'16 & 5/'16   a) 2013: EF 60-65%, mild MR, Gr 2 DD; b) 2/'16 @ [facility]: EF ~10% - severel global LV dysfunction (systolic & diastolic) - dilated LV & restrictive filling pattern - Gr 3 DD, mod reduced RV function, severely dilated LA - 6.4 cm, mod dilated RA, mod pl effusion, mod MR, mild TR; c) 5/10/'16:  EF 25-30%, mod LV dilation, no RWMA,Gr 3 DD w/ high LAP, MV sewing ring intact w/ Mod MR, Severe LA dilation   TRANSTHORACIC ECHOCARDIOGRAM  January 2017:   EF improved to 45-50%. Mild diffuse HK. Mild MR. Moderate LA dilation. Normal PA pressures.   No Known Allergies       01/11/2024    3:54 PM 08/10/2023   10:36 AM 08/15/2022   10:40 AM  Depression screen PHQ 2/9  Decreased Interest 0 0 0  Down, Depressed, Hopeless 0 0 0  PHQ - 2 Score 0 0 0        No data to display  Review of Systems  Constitutional:  Positive for chills, fever and malaise/fatigue.  HENT:  Positive for congestion. Negative for ear pain, sinus pain and sore throat.   Respiratory:  Positive for cough and wheezing. Negative for shortness of breath.   Cardiovascular:  Negative for chest pain.  Gastrointestinal:  Positive for diarrhea. Negative for abdominal pain, constipation, heartburn, nausea and vomiting.  Genitourinary:  Negative for dysuria, frequency and urgency.  Neurological:  Negative for dizziness and headaches.  Endo/Heme/Allergies:  Negative for polydipsia.  Psychiatric/Behavioral:  Negative for depression and suicidal ideas. The patient is not nervous/anxious.       Objective:     BP 122/60   Pulse (!) 55   Temp 98.3 F (36.8 C) (Oral)   Wt 155 lb (70.3 kg)   SpO2 99%   BMI 24.28 kg/m  BP Readings from Last 3 Encounters:  02/19/24 122/60  02/05/24 100/60  01/11/24 120/60   Wt Readings from Last 3 Encounters:  02/19/24 155 lb (70.3 kg)  02/05/24 159 lb 2 oz  (72.2 kg)  10/02/23 162 lb 6 oz (73.7 kg)      Physical Exam Vitals and nursing note reviewed.  Constitutional:      Appearance: She is ill-appearing.  HENT:     Right Ear: Tympanic membrane, ear canal and external ear normal.     Left Ear: Tympanic membrane, ear canal and external ear normal.     Nose: Congestion present.     Right Sinus: Maxillary sinus tenderness present. No frontal sinus tenderness.     Left Sinus: Maxillary sinus tenderness present. No frontal sinus tenderness.     Mouth/Throat:     Pharynx: Oropharynx is clear. Postnasal drip present.   Eyes:     Conjunctiva/sclera: Conjunctivae normal.    Cardiovascular:     Rate and Rhythm: Normal rate and regular rhythm.     Pulses: Normal pulses.     Heart sounds: Normal heart sounds.  Pulmonary:     Effort: Pulmonary effort is normal.     Breath sounds: Normal breath sounds.   Neurological:     Mental Status: She is alert and oriented to person, place, and time.   Psychiatric:        Mood and Affect: Mood normal.        Behavior: Behavior normal.        Thought Content: Thought content normal.        Judgment: Judgment normal.      Results for orders placed or performed in visit on 02/19/24  POC COVID-19  Result Value Ref Range   SARS Coronavirus 2 Ag Negative Negative  POCT Influenza A/B  Result Value Ref Range   Influenza A, POC Negative Negative   Influenza B, POC Negative Negative       The ASCVD Risk score (Arnett DK, et al., 2019) failed to calculate for the following reasons:   The valid total cholesterol range is 130 to 320 mg/dL    Assessment & Plan:  Acute cough Assessment & Plan: Poc covid and flu negative.  Suspect viral vs bacterial URI and/or pneumonia.  Wheezing auscultated on exam. Given her presentation, will get chest x-ray STAT.   Discussed symptoms and treatment options at length.  Encourage rest, increase fluid intake and Humidifier.  Start Benzonatate  capsules for  cough. Take 1 capsule by mouth three times daily as needed for cough. Start mucinex  (plain) twice daily as needed.  Continue tylenol  and delsym as needed. Await results.  ER precautions provided.  Orders: -     POC COVID-19 BinaxNow -     POCT Influenza A/B -     DG Chest 2 View -     Benzonatate ; Take 1 capsule (200 mg total) by mouth 3 (three) times daily as needed.  Dispense: 20 capsule; Refill: 0  Shortness of breath Assessment & Plan: Poc covid and flu negative.  Suspect viral vs bacterial URI and/or pneumonia.  Wheezing auscultated on exam. Stable for out patient treatment. Given her presentation, will get chest x-ray STAT.   Discussed symptoms and treatment options at length.  Encourage rest, increase fluid intake and Humidifier.  Start Benzonatate  capsules for cough. Take 1 capsule by mouth three times daily as needed for cough. Start mucinex  (plain) twice daily as needed.  Continue tylenol  and delsym as needed. Await results. ER precautions provided.  Orders: -     DG Chest 2 View     No follow-ups on file.    Carrol Aurora, NP

## 2024-02-19 NOTE — Patient Instructions (Signed)
 Complete xray(s) prior to leaving today. I will notify you of your results once received.  Start Benzonatate  capsules for cough. Take 1 capsule by mouth three times daily as needed for cough.  Start mucinex  (plain) twice daily as needed.   I will be in touch once I look at the x-ray results.   It was a pleasure meeting you!

## 2024-02-19 NOTE — Telephone Encounter (Signed)
 FYI Only or Action Required?: Action required by provider: request for appointment.  Patient was last seen in primary care on 02/05/2024 by Watt Mirza, MD. Called Nurse Triage reporting Cough. Symptoms began several days ago. Interventions attempted: OTC medications: Delsym. Symptoms are: unchanged.  Triage Disposition: See Physician Within 24 Hours  Patient/caregiver understands and will follow disposition?: YesCopied from CRM [id number]. Topic: Clinical - Red Word Triage >> Feb 19, 2024  8:07 AM Adelita E wrote: [facility] that prompted transfer to Nurse Triage: Patient developed a deep cough on Saturday and is coughing up a deep yellow-colored phlegm. Running a fever between 99 and 100. Reason for Disposition  SEVERE coughing spells (e.g., whooping sound after coughing, vomiting after coughing)  Answer Assessment - Initial Assessment Questions 1. ONSET: When did the cough begin?      Saturday   3. SPUTUM: Describe the color of your sputum (none, dry cough; clear, white, yellow, green)     Yellow  4. HEMOPTYSIS: Are you coughing up any blood? If so ask: How much? (flecks, streaks, tablespoons, etc.)     Na  5. DIFFICULTY BREATHING: Are you having difficulty breathing? If Yes, ask: How bad is it? (e.g., mild, moderate, severe)    - MILD: No SOB at rest, mild SOB with walking, speaks normally in sentences, can lie down, no retractions, pulse < 100.    - MODERATE: SOB at rest, SOB with minimal exertion and prefers to sit, cannot lie down flat, speaks in phrases, mild retractions, audible wheezing, pulse 100-120.    - SEVERE: Very SOB at rest, speaks in single words, struggling to breathe, sitting hunched forward, retractions, pulse > 120      Denies  6. FEVER: Do you have a fever? If Yes, ask: What is your temperature, how was it measured, and when did it start?     99-100 7. CARDIAC HISTORY: Do you have any history of heart disease? (e.g., heart attack, congestive heart  failure)      CHF 8. LUNG HISTORY: Do you have any history of lung disease?  (e.g., pulmonary embolus, asthma, emphysema)     denies 9. PE RISK FACTORS: Do you have a history of blood clots? (or: recent major surgery, recent prolonged travel, bedridden)     Na  10. OTHER SYMPTOMS: Do you have any other symptoms? (e.g., runny nose, wheezing, chest pain)       Chest is rattling    Pt has been using Delsym but ran out. Pt's appetite is less but is drinking.  Protocols used: Cough - Acute Productive-A-AH

## 2024-02-19 NOTE — Telephone Encounter (Signed)
Called and discussed results with the patient

## 2024-02-19 NOTE — Assessment & Plan Note (Signed)
 Poc covid and flu negative.  Suspect viral vs bacterial URI and/or pneumonia.  Wheezing auscultated on exam. Given her presentation, will get chest x-ray STAT.   Discussed symptoms and treatment options at length.  Encourage rest, increase fluid intake and Humidifier.  Start Benzonatate  capsules for cough. Take 1 capsule by mouth three times daily as needed for cough. Start mucinex  (plain) twice daily as needed.  Continue tylenol  and delsym as needed. Await results. ER precautions provided.

## 2024-02-19 NOTE — Telephone Encounter (Signed)
 Copied from CRM [id number]. Topic: Clinical - Lab/Test Results >> Feb 19, 2024  3:20 PM [name] wrote: Reason for CRM: patient called stating she has lab test results in mychart that she could not get to. I told the patient that I could reset her password for her and she stated they already did that this morning when she came in for an appointment. The patient stated she will wait until the nurse call her for the results

## 2024-02-20 ENCOUNTER — Ambulatory Visit: Payer: Self-pay

## 2024-02-20 NOTE — Telephone Encounter (Signed)
 FYI Only or Action Required?: FYI only for provider.  Patient was last seen in primary care on 02/19/2024 by Vincente Shivers, NP. Called Nurse Triage reporting Cough. Symptoms began several days ago. Interventions attempted: Nothing. Symptoms are: stable.  Triage Disposition: Home Care See notes.   Patient/caregiver understands and will follow disposition?: Yes               Copied from CRM [id number]. Topic: Clinical - Red Word Triage >> Feb 20, 2024  9:49 AM Robinson H wrote: [facility] that prompted transfer to Nurse Triage: Seen yesterday for pneumonia having more symptoms today coughing and wheezing badly, now on left side of chest was just on the right side Reason for Disposition  Pneumonia, questions about  Answer Assessment - Initial Assessment Questions 1. SYMPTOM: What's the main symptom you're concerned about? (e.g., breathing difficulty, fever, weakness)     ------------------------ Rattling now on the right side    2. ONSET: When did the   start?     -----------------Today  3. BETTER-SAME-WORSE: Are you getting better, staying the same, or getting worse compared to the day you were discharged?     ------ The same   4. BREATHING DIFFICULTY: Are you having any difficulty breathing? If Yes, ask: How bad is it?  (e.g., none, mild, moderate, severe)   - MILD: No SOB at rest, mild SOB with walking, speaks normally in sentences, can lie down, no retractions, pulse < 100.    - MODERATE: SOB at rest, SOB with minimal exertion and prefers to sit, cannot lie down flat, speaks in phrases, mild retractions, audible wheezing, pulse 100-120.    - SEVERE: Very SOB at rest, speaks in single words, struggling to breathe,sitting hunched forward, retractions, pulse > 120      ------------------Denies difficulty    5. FEVER: Do you have a fever? If Yes, ask: What is your temperature, how was it measured, and when did it start?     ------------ n/a   6. SPUTUM: Describe  the color of your sputum (clear, white, yellow, green, blood-tinged)     ---- The same   7. DIAGNOSIS CONFIRMATION: When was the pneumonia diagnosed? By whom?     ---Yes, yesterday by provider   8. ANTIBIOTIC: Are you taking an antibiotic?  If Yes, ask: Which one? When was it started?     ---Doxycycline --- 7 day course. First dose was yesterday     Additional Information:  Patient was calling to confirm the Doxycycline  would help with this pneumonia. Patient reports that she had rattling on her L. Side. Now, its is on her Right side.  Patient denies s/s have worsened. Patient just wanted to be sure that this antibiotic is okay for this.    This nurse reassured her and educated her on sleeping positions to keep her comfortable.  Also educated pt on pertinent s/s that would warrant emergent help/call 911/ ED/UC.  Patient verbalized understanding and agrees with planNo additional questions/concerns noted during the time of the call.  Protocols used: Pneumonia Follow-up Call-A-AH

## 2024-02-20 NOTE — Telephone Encounter (Signed)
 I spoke with pt; pt said she was wanting B Vincente NP to know that when seen on 02/19/24 wheezing was on left side of chest and now wheezing is on rt side as well. Pt has no CP and no SOB; pt has no fever but pt is taking tylenol  because it helps to relax her and helps her back pain that she has. Pt has  same prod cough with slightly yellow phlegm as when seen on 02/19/24.  Pt started doxycycline  100 mg on 02/19/24 and as of now pt has had 2 doses of abx. Pt will take 3rd dose later today. Pt said the benzonatate  is helping cough, pt said she is in no distress. UC & ED precautions given and pt voiced understanding. Pt is staying hydrated also. Pt appreciates call and sending note as FYI to KATHEE Vincente NP.

## 2024-02-26 ENCOUNTER — Encounter: Payer: Self-pay | Admitting: Family Medicine

## 2024-02-26 ENCOUNTER — Ambulatory Visit (INDEPENDENT_AMBULATORY_CARE_PROVIDER_SITE_OTHER): Admitting: Family Medicine

## 2024-02-26 ENCOUNTER — Telehealth: Payer: Self-pay

## 2024-02-26 ENCOUNTER — Ambulatory Visit: Payer: Self-pay

## 2024-02-26 VITALS — BP 112/64 | HR 69 | Temp 98.9°F | Ht 67.0 in | Wt 153.1 lb

## 2024-02-26 DIAGNOSIS — R062 Wheezing: Secondary | ICD-10-CM | POA: Diagnosis not present

## 2024-02-26 DIAGNOSIS — J189 Pneumonia, unspecified organism: Secondary | ICD-10-CM

## 2024-02-26 MED ORDER — NITROFURANTOIN MONOHYD MACRO 100 MG PO CAPS: 100.0000 mg | ORAL_CAPSULE | Freq: Every day | ORAL | 1 refills | Status: DC

## 2024-02-26 MED ORDER — CEFDINIR 300 MG PO CAPS: 600.0000 mg | ORAL_CAPSULE | Freq: Every day | ORAL | 0 refills | Status: DC

## 2024-02-26 MED ORDER — PREDNISONE 20 MG PO TABS: ORAL_TABLET | ORAL | 0 refills | Status: DC

## 2024-02-26 NOTE — Telephone Encounter (Signed)
FYI for appt today

## 2024-02-26 NOTE — Telephone Encounter (Signed)
 Patient scheduled appt with Dr. Watt today at 2:40 pm. FYI for appt today

## 2024-02-26 NOTE — Progress Notes (Signed)
 Deborah Ausborn T. Demonte Dobratz, MD, CAQ Sports [name] at [name] [address] [place], [address]  Phone: [phone]  FAX: [phone]  [name] - 78 y.o. female  MRN [id number]  Date of Birth: 1946-04-16  Date: 02/26/2024  PCP: Watt Mirza, MD  Referral: Watt Mirza, MD  Chief Complaint  Patient presents with   Cough    Follow Up PNA-Finished medicaiton yesterday but still coughing and has rattle in chest   Medical Management of Chronic Issues    Wants you to prescribe nitrofuratoin 100 mg daily prescribed by Dr. Glendia Comes.  Does not see him anymore   Subjective:   [name] is a 78 y.o. very pleasant female patient with Body mass index is 23.98 kg/m. who presents with the following:  F/u PNA -is a very well-known patient, who I have known for many years.  She was previously seen on February 19, 2024 with a right upper lobe pneumonia and was prescribed doxycycline  as well as Tessalon  Perles.  She is still wheezing and coughing a lot, however she does feel like she is significantly better than she was 1 week ago. She does feel short of breath at time and she also has some audible sounds when she is breathing and coughing  She is not a smoker and she has never been a smoker and she has no history of COPD or lung disease  Review of Systems is noted in the HPI, as appropriate  Objective:   BP 112/64   Pulse 69   Temp 98.9 F (37.2 C) (Temporal)   Ht 5' 7 (1.702 m)   Wt 153 lb 2 oz (69.5 kg)   SpO2 96%   BMI 23.98 kg/m    Gen: WDWN, NAD. Globally Non-toxic HEENT: Throat clear, w/o exudate, R TM clear, L TM - good landmarks, No fluid present. rhinnorhea.  MMM Frontal sinuses: NT Max sinuses: NT NECK: Anterior cervical  LAD is absent CV: RRR, No M/G/R, cap refill <2 sec PULM: Breathing comfortably in no respiratory distress.  She has significant bilateral wheezes and extensive rhonchorous sounds without  crackles  Laboratory and Imaging Data:  Assessment and Plan:     ICD-10-CM   1. Community acquired pneumonia of right lung, unspecified part of lung  J18.9     2. Wheezing  R06.2      Incompletely resolved pneumonia.  I am going to add an additional 7 days of Omnicef , and also give her some prednisone , which I think will help with her wheezing and shortness of breath.  She also asked me to take over prescribing her daily Macrobid , which she uses for UTI prophylaxis.  Medication Management during today's office visit: Meds ordered this encounter  Medications   nitrofurantoin , macrocrystal-monohydrate, (MACROBID ) 100 MG capsule    Sig: Take 1 capsule (100 mg total) by mouth daily.    Dispense:  90 capsule    Refill:  1   cefdinir  (OMNICEF ) 300 MG capsule    Sig: Take 2 capsules (600 mg total) by mouth daily.    Dispense:  14 capsule    Refill:  0   predniSONE  (DELTASONE ) 20 MG tablet    Sig: 2 tabs po for 4 days, then 1 tab po for 4 days    Dispense:  12 tablet    Refill:  0   Medications Discontinued During This Encounter  Medication Reason   doxycycline  (VIBRA -TABS) 100 MG tablet Completed Course  Orders placed today for conditions managed today: No orders of the defined types were placed in this encounter.   Disposition: No follow-ups on file.  [employer] One speech-to-text software was used for transcription in this dictation.  Possible transcriptional errors can occur using Animal nutritionist.   Signed,  [name], MD   Outpatient Encounter Medications as of 02/26/2024  Medication Sig   acetaminophen  (TYLENOL ) 500 MG tablet Take 1,000 mg by mouth every 8 (eight) hours as needed.   amiodarone  (PACERONE ) 200 MG tablet Take 100 mg by mouth daily.   benzonatate  (TESSALON ) 200 MG capsule Take 1 capsule (200 mg total) by mouth 3 (three) times daily as needed.   BIOTIN PO Take 1 tablet by mouth daily.   Calcium  Carbonate (CALCIUM  600 PO) Take 1 tablet by  mouth daily.   Calcium  Citrate-Vitamin D  (CALCIUM  + D PO) Take 1 capsule by mouth daily.   carvedilol  (COREG ) 3.125 MG tablet TAKE ONE TABLET (3.125 MG TOTAL) BY MOUTH TWICE DAILY.   cefdinir  (OMNICEF ) 300 MG capsule Take 2 capsules (600 mg total) by mouth daily.   cetirizine (ZYRTEC) 10 MG tablet Take 10 mg by mouth daily.   Cholecalciferol (VITAMIN D -3) 125 MCG (5000 UT) TABS Take 1 tablet by mouth daily.   cyanocobalamin  (VITAMIN B12) 1000 MCG tablet Take 1,000 mcg by mouth daily.   donepezil  (ARICEPT ) 5 MG tablet TAKE ONE TABLET BY MOUTH DAILY AT BEDTIME   fluticasone  (FLONASE ) 50 MCG/ACT nasal spray PLACE TWO SPRAYS INTO BOTH NOSTRILS DAILY   IRON, FERROUS SULFATE, PO Take 1 tablet by mouth daily.   levothyroxine  (SYNTHROID ) 75 MCG tablet TAKE ONE TAB BY MOUTH ONCE DAILY. TAKE ON AN EMPTY STOMACH WITH A GLASS OF WATER ATLEAST 30-60 MINUTES BEFORE BREAKFAST   losartan  (COZAAR ) 25 MG tablet TAKE ONE TABLET BY MOUTH ONCE DAILY   Melatonin 5 MG CAPS Take 1 capsule by mouth at bedtime.   metroNIDAZOLE  (METROCREAM ) 0.75 % cream Apply topically 2 (two) times daily.   nitrofurantoin , macrocrystal-monohydrate, (MACROBID ) 100 MG capsule Take 1 capsule (100 mg total) by mouth daily.   Omega-3 Fatty Acids (FISH OIL PO) Take 1 capsule by mouth daily.   pantoprazole  (PROTONIX ) 40 MG tablet TAKE ONE TABLET BY MOUTH DAILY   permethrin (ELIMITE) 5 % cream APPLY PEA SIZED AMOUNT TO FACE ONCE A DAY   predniSONE  (DELTASONE ) 20 MG tablet 2 tabs po for 4 days, then 1 tab po for 4 days   rosuvastatin  (CRESTOR ) 10 MG tablet TAKE ONE TABLET BY MOUTH ONCE DAILY   traMADol  (ULTRAM ) 50 MG tablet Take 1 tablet (50 mg total) by mouth every 8 (eight) hours as needed for moderate pain (pain score 4-6).   traZODone  (DESYREL ) 50 MG tablet TAKE ONE HALF (1/2) TO ONE TABLET BY MOUTH AT BEDTIME AS NEEDED FOR SLEEP.   warfarin (COUMADIN ) 5 MG tablet TAKE ONE HALF (1/2) TO ONE TABLET BY MOUTH DAILY AS DIRECTED BY COUMADIN   CLINIC   [DISCONTINUED] doxycycline  (VIBRA -TABS) 100 MG tablet Take 1 tablet (100 mg total) by mouth 2 (two) times daily for 7 days.   No facility-administered encounter medications on file as of 02/26/2024.

## 2024-02-26 NOTE — Telephone Encounter (Signed)
 Copied from CRM [id number]. Topic: Clinical - Medical Advice >> Feb 26, 2024  7:43 AM [name] GRADE wrote: Reason for CRM: Patient was seen on Monday 02/19/2024 for pneumonia. She finished the medication and would like to know if she needs to come in to be seen again. She still has a lingering cough.

## 2024-02-26 NOTE — Telephone Encounter (Signed)
 FYI Only or Action Required?: FYI only for provider.  Patient was last seen in primary care on 02/19/2024 by Vincente Shivers, NP. Called Nurse Triage reporting Pneumonia. Symptoms began a week ago. Interventions attempted: Prescription medications: abx completed and Rest, hydration, or home remedies. Symptoms are: gradually worsening.  Triage Disposition: See Physician Within 24 Hours  Patient/caregiver understands and will follow disposition?: Yes  Copied from CRM [id number]. Topic: Clinical - Red Word Triage >> Feb 26, 2024 10:46 AM Drema MATSU wrote: Red Word that prompted transfer to Nurse Triage: Patient still has rattling in her left lung, constant cough, and wheezing still. She took her last antibiotic last night and wants to know if she needs to come back in since she is not really better. Reason for Disposition  [1] Taking antibiotic > 48 hours (2 days) for pneumonia AND [2] breathing not improved  Answer Assessment - Initial Assessment Questions 1. SYMPTOM: What's the main symptom you're concerned about? (e.g., breathing difficulty, fever, weakness)     Rattling cough 2. ONSET: When did the  rattling  start?     ongoing 3. BETTER-SAME-WORSE: Are you getting better, staying the same, or getting worse compared to the day you were discharged?     worse 4. BREATHING DIFFICULTY: Are you having any difficulty breathing? If Yes, ask: How bad is it?  (e.g., none, mild, moderate, severe)   - MILD: No SOB at rest, mild SOB with walking, speaks normally in sentences, can lie down, no retractions, pulse < 100.    - MODERATE: SOB at rest, SOB with minimal exertion and prefers to sit, cannot lie down flat, speaks in phrases, mild retractions, audible wheezing, pulse 100-120.    - SEVERE: Very SOB at rest, speaks in single words, struggling to breathe, sitting hunched forward, retractions, pulse > 120      Mild 5. FEVER: Do you have a fever? If Yes, ask: What is your temperature, how was  it measured, and when did it start?     Denies 6. SPUTUM: Describe the color of your sputum (clear, white, yellow, green, blood-tinged)     Thick  7. DIAGNOSIS CONFIRMATION: When was the pneumonia diagnosed? By whom?     At [facility] 02/19/24 8. ANTIBIOTIC: Are you taking an antibiotic?  If Yes, ask: Which one? When was it started?     Doxycycline -finished 9. OTHER TREATMENT: Are you receiving any other treatment for the pneumonia? (e.g., albuterol  nebulizer, oxygen) If Yes, ask: How often? and Does it help?     Home treatments  10. HOSPITAL ADMISSION: Were you hospitalized for this pneumonia? If Yes, ask: When were you discharged home from the hospital?       No  Protocols used: Pneumonia Follow-up Call-A-AH

## 2024-02-28 ENCOUNTER — Ambulatory Visit

## 2024-02-29 ENCOUNTER — Ambulatory Visit: Admitting: Medical

## 2024-02-29 ENCOUNTER — Encounter

## 2024-03-06 ENCOUNTER — Ambulatory Visit: Attending: Internal Medicine

## 2024-03-06 ENCOUNTER — Other Ambulatory Visit: Payer: Self-pay | Admitting: Family Medicine

## 2024-03-06 DIAGNOSIS — I480 Paroxysmal atrial fibrillation: Secondary | ICD-10-CM

## 2024-03-06 LAB — POCT INR: INR: 3.3 — AB (ref 2.0–3.0)

## 2024-03-06 NOTE — Patient Instructions (Signed)
 Description   Skip today's dosage of Warfarin, then resume same dosage of Warfarin 1 tablet daily except 1/2 tablet on Mondays and Fridays.  Repeat INR in 3 weeks  Call Coumadin  Clinic with any new medications or antibiotics. [phone]

## 2024-03-14 ENCOUNTER — Ambulatory Visit
Admission: RE | Admit: 2024-03-14 | Discharge: 2024-03-14 | Disposition: A | Discharge status: Home / Self Care | Source: Ambulatory Visit | Attending: Family Medicine | Admitting: Family Medicine

## 2024-03-14 DIAGNOSIS — Z1231 Encounter for screening mammogram for malignant neoplasm of breast: Secondary | ICD-10-CM | POA: Insufficient documentation

## 2024-03-21 ENCOUNTER — Ambulatory Visit: Attending: Medical | Admitting: Medical

## 2024-03-21 ENCOUNTER — Encounter: Payer: Self-pay | Admitting: Medical

## 2024-03-21 VITALS — BP 120/70 | HR 82 | Ht 67.0 in | Wt 155.0 lb

## 2024-03-21 DIAGNOSIS — I428 Other cardiomyopathies: Secondary | ICD-10-CM | POA: Diagnosis not present

## 2024-03-21 DIAGNOSIS — E782 Mixed hyperlipidemia: Secondary | ICD-10-CM

## 2024-03-21 DIAGNOSIS — I4819 Other persistent atrial fibrillation: Secondary | ICD-10-CM

## 2024-03-21 DIAGNOSIS — Z9889 Other specified postprocedural states: Secondary | ICD-10-CM | POA: Diagnosis not present

## 2024-03-21 DIAGNOSIS — I700 Atherosclerosis of aorta: Secondary | ICD-10-CM

## 2024-03-21 DIAGNOSIS — I059 Rheumatic mitral valve disease, unspecified: Secondary | ICD-10-CM | POA: Diagnosis not present

## 2024-03-21 DIAGNOSIS — I480 Paroxysmal atrial fibrillation: Secondary | ICD-10-CM

## 2024-03-21 NOTE — Patient Instructions (Signed)
 Medication Instructions:  Your physician recommends that you continue on your current medications as directed. Please refer to the Current Medication list given to you today.   *If you need a refill on your cardiac medications before your next appointment, please call your pharmacy*  Lab Work: No labs ordered today  If you have labs (blood work) drawn today and your tests are completely normal, you will receive your results only by: MyChart Message (if you have MyChart) OR A paper copy in the mail If you have any lab test that is abnormal or we need to change your treatment, we will call you to review the results.  Testing/Procedures: No test ordered today   Follow-Up: At [facility], you and your health needs are our priority.  As part of our continuing mission to provide you with exceptional heart care, our providers are all part of one team.  This team includes your primary Cardiologist (physician) and Advanced Practice Providers or APPs (Physician Assistants and Nurse Practitioners) who all work together to provide you with the care you need, when you need it.  Your next appointment:   First avail with EP  9 month(s)  Gen Cards  Provider:   Lonni Hanson, MD or Cadence Franchester, PA-C

## 2024-03-21 NOTE — Progress Notes (Unsigned)
 Cardiology Office Note   Date:  03/22/2024  ID:  [name], [name] Jun 27, 1946, MRN [id number] PCP: Deborah Mirza, MD  [facility] Providers Cardiologist:  Deborah Hanson, MD Electrophysiologist:  Deborah ONEIDA HOLTS, MD   History of Present Illness [name] is a 78 y.o. 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    R/L HC in 2005 showed no significant CAD. TEE from 2016 showed EF 10-15% with diffuse hypokinesis. Nuclear stress test in 2016 showed no evidence of ischemia with an EF of 37%. Echo from 12/2014 showed an EF 25-30% with moderate MR. Over the years, EF has continued to improve with echo from 2022 showing EF 55-60%, no WMA, moderate LVH, normal RVSF, severely dilated left atrium, prior replacement of mitral valve with mild regurgitation.    Patient was seen in April 2024 reporting chest pain.  Cardiac CTA and echocardiogram ordered.  Echo showed LVEF 60 to 65%, no wall motion abnormalities, mild LVH, normal RV SF, mildly elevated pulmonary artery systolic pressure, repaired/replaced mitral valve, mild MR with a mean gradient of 3 mmHg, severely dilated left atrium, mild to moderate TR.  Cardiac CTA showed no coronary calcium .   Patient went to the ER on 03/28/2023 for chest pain, shortness of breath and palpitations.  She was found to be in A-fib RVR with heart rates up to 120s.  Blood pressure was mildly elevated.  Labs showed nonspecific leukocytosis.  BNP 209.  INR 2.1.  Chest x-ray showed cardiomegaly with no pulmonary edema.  CTA chest showed no PE.  Patient was recommended she double up on her Coreg  and she was sent home.    Patient was seen in ER follow-up August 24 and was in rate controlled A-fib in the 70s.  She was overall feeling better but not back to baseline.  Patient was reloaded with amiodarone  and sent to EP.  Given patient had been loaded with amiodarone , she was set up for repeat cardioversion.  Patient underwent successful cardioversion on May 12, 2023.  Patient was seen in follow-up 06/19/2023 and was in sinus bradycardia with a heart rate of 46 bpm on amiodarone  100 mg daily.  Coreg  was stopped and she followed up with EP in December 2024.  Coreg  was restarted.   Today, the patient is back in Afib with controlled rates. She has occasional SOB when she is exerting herself. No chest pain. She occasionally has fluttering that seems to be worse after exertion and she lays down. No lower leg edema. Activity level is the same.    Studies Reviewed EKG Interpretation Date/Time:  Thursday March 21 2024 14:52:06 EDT Ventricular Rate:  82 PR Interval:    QRS Duration:  98 QT Interval:  374 QTC Calculation: 436 R Axis:   39  Text Interpretation: Atrial fibrillation Marked ST abnormality, possible lateral subendocardial injury When compared with ECG of 04-Aug-2023 11:25, Current undetermined rhythm precludes rhythm comparison, needs review Confirmed by [name] ([id number]) on 03/22/2024 11:55:05 AM    Echo 12/2022 1. Left ventricular ejection fraction, by estimation, is 60 to 65%. The  left ventricle has normal function. The left ventricle has no regional  wall motion abnormalities.  There is mild left ventricular hypertrophy.  Left ventricular diastolic parameters  are indeterminate. The average left ventricular global longitudinal strain  is -13.0 %.   2. Right ventricular systolic function is normal. The right ventricular  size is normal. There is mildly elevated pulmonary artery systolic  pressure. The estimated right ventricular systolic pressure is 42.2 mmHg.   3. The mitral valve has been  repaired/replaced. Mild mitral valve  regurgitation. No evidence of mitral stenosis. mean gradient 3 mm Hg s/p  prostetic annuloplasty ring   4. Left atrial size was severely dilated.   5. Tricuspid valve regurgitation is mild to moderate.   6. The aortic valve is tricuspid. Aortic valve regurgitation is not  visualized. No aortic stenosis is present.   7. The inferior vena cava is normal in size with greater than 50%  respiratory variability, suggesting right atrial pressure of 3 mmHg.   Comparison(s): 10/20/20: 55-60%, mild LVH, LAE, MV gradient  Hx MVR with prostetic annuloplasty ring.    Cardiac CTA 11/2022   IMPRESSION: 1. Normal coronary calcium  score of 0.   2. Normal coronary origin with right dominance.   3. No evidence of CAD.   4. CAD-RADS 0. No evidence of CAD (0%). Consider non-atherosclerotic causes of chest pain.   Electronically Signed: By: [name] M.D. On: 12/22/2022 17:42      Physical Exam VS:  BP 120/70 (BP Location: Left Arm, Patient Position: Sitting, Cuff Size: Normal)   Pulse 82   Ht 5' 7 (1.702 m)   Wt 155 lb (70.3 kg)   SpO2 99%   BMI 24.28 kg/m        Wt Readings from Last 3 Encounters:  03/21/24 155 lb (70.3 kg)  02/26/24 153 lb 2 oz (69.5 kg)  02/19/24 155 lb (70.3 kg)    GEN: Well nourished, well developed in no acute distress NECK: No JVD; No carotid bruits CARDIAC: Irreg IRreg, no murmurs, rubs, gallops RESPIRATORY:  Clear to auscultation without rales, wheezing or rhonchi  ABDOMEN: Soft, non-tender, non-distended EXTREMITIES:  No edema; No deformity   ASSESSMENT AND PLAN  Persistent Afib Patient appears to be back in Afib today with rates in the 80s. She reports occasional fluttering, but overall she is mostly asymptomatic. She is taking amiodarone  100mg  daily and Coreg  3.125,gBID. she has a h/o bradycardia in SR.  I will refer back to EP to decide further management. She is on coumadin  for stroke ppx.    NICM MR s/p MVR Echo in May 2024 showed LVEF 60-65%, mid LVH, repaired mitral valve with mild MR and no stenosis with a mean gradient of , severely dilated left atrium, mild to mod TR. Patient takes lasix  as needed. She is euvolemic on exam. Continue Losartan  25mg  daily and Coreg  3.125mg BID. can repeat echo next year or if symptoms change.   Aortic atherosclerosis/HLD Cholesterol well controlled. Continue Crestor  10mg  daily.      Dispo: Follow-up in 9 months with general cards  Signed, Marcellina Jonsson VEAR Fishman, PA-C

## 2024-03-26 ENCOUNTER — Telehealth: Payer: Self-pay | Admitting: Family Medicine

## 2024-03-26 NOTE — Telephone Encounter (Signed)
 Last CPE office note/Med and Allergy list faxed as requested.

## 2024-03-26 NOTE — Telephone Encounter (Signed)
 Copied from CRM #[id number]. Topic: Medical Record Request - Records Request >> Mar 26, 2024 10:59 AM Deaijah H wrote: Reason for CRM: Patient called in stating her ophthalmologist doctor is needing her Medication list and medical records. [facility] [place]. [address] [place] [address] Fax: [phone] Phone: [phone]. Cataract surgery scheduled 8/26 and if it needs to be picked up to call and let her know

## 2024-03-27 ENCOUNTER — Ambulatory Visit: Attending: Internal Medicine

## 2024-03-27 DIAGNOSIS — Z5181 Encounter for therapeutic drug level monitoring: Secondary | ICD-10-CM

## 2024-03-27 DIAGNOSIS — I480 Paroxysmal atrial fibrillation: Secondary | ICD-10-CM | POA: Diagnosis not present

## 2024-03-27 LAB — POCT INR: INR: 2.1 (ref 2.0–3.0)

## 2024-03-27 NOTE — Patient Instructions (Signed)
 Continue 1 tablet daily except 1/2 tablet on Mondays and Fridays.  Repeat INR in 6 weeks  Call Coumadin  Clinic with any new medications or antibiotics. [phone]

## 2024-04-25 ENCOUNTER — Other Ambulatory Visit: Payer: Self-pay | Admitting: Cardiology

## 2024-04-25 DIAGNOSIS — I4819 Other persistent atrial fibrillation: Secondary | ICD-10-CM

## 2024-04-25 NOTE — Telephone Encounter (Signed)
 Prescription refill request received for warfarin Lov: 03/21/2024 [place] Next INR check: 9/10 Warfarin tablet strength:5mg 

## 2024-04-25 NOTE — Progress Notes (Unsigned)
 Electrophysiology Clinic Note    Date:  04/26/2024  Patient ID:  [name], DOB 1946/01/19, MRN [id number] PCP:  Deborah Mirza, MD  Cardiologist:  Deborah Hanson, MD   Electrophysiologist:  Deborah ONEIDA HOLTS, MD     Discussed the use of AI scribe software for clinical note transcription with the patient, who gave verbal consent to proceed.   Patient Profile    Chief Complaint: AFib, amiodarone  follow-up  History of Present Illness: [name] is a 78 y.o. 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  She is not AF ablation candidate d/t severely dilated LA. She last saw Dr. HOLTS 03/2023 where he planned for DCCV and if failed, plan to deem AFib perm.   I last saw her 07/2023 where she was maintaining sinus rhythm after DCCV 04/2023 and doing well.  She saw [name] 02/2024 where she was back in AFib with controlled ventricular rates, had occasional SOB with exertion.   On follow-up today, she  is having intermittent palpitations, more pronounced with laying on L side when trying to sleep. She denies chest pain, chest pressure, SOB, DOE, or increased edema.  She continues to take 100mg  amiodarone  daily She is on warfarin for stroke ppx, managed at [facility] office   Arrhythmia/Device History Amiodarone     ROS:  Please see the history of present illness. All other systems are reviewed and otherwise negative.    Physical Exam    VS:  BP 126/82 (BP Location: Left Arm, Patient Position: Sitting, Cuff Size: Normal)   Pulse 71   Ht 5' 7 (1.702 m)   Wt 153 lb 12.8 oz (69.8 kg)   SpO2 92%   BMI 24.09 kg/m  BMI: Body mass index is 24.09 kg/m.      Wt Readings from Last 3 Encounters:  04/26/24 153 lb 12.8 oz (69.8 kg)  03/21/24 155 lb (70.3 kg)  02/26/24 153 lb 2 oz (69.5  kg)     GEN- The patient is well appearing, alert and oriented x 3 today.   Lungs- Clear to ausculation bilaterally, normal work of breathing.  Heart- Irregularly irregular rate and rhythm, no murmurs, rubs or gallops Extremities- No peripheral edema, warm, dry    Studies Reviewed   Previous EP, cardiology notes.    EKG is ordered. Personal review of EKG from today shows:    EKG Interpretation Date/Time:  Friday April 26 2024 10:05:40 EDT Ventricular Rate:  71 PR Interval:    QRS Duration:  106 QT Interval:  438 QTC Calculation: 475 R Axis:   27  Text Interpretation: Atrial flutter with variable A-V block ST & T wave abnormality, consider anterolateral ischemia Prolonged QT Confirmed by [name] [phone]) on 04/26/2024 10:32:48 AM    TTE, 01/03/2023  1. Left ventricular ejection fraction, by estimation, is 60 to 65%. The left ventricle has normal function. The left ventricle has no regional wall motion abnormalities. There is mild left ventricular hypertrophy. Left ventricular diastolic parameters are indeterminate. The average left ventricular global longitudinal strain is -13.0 %.   2. Right ventricular systolic function is normal. The right ventricular size is normal. There is mildly elevated pulmonary artery systolic pressure. The estimated right ventricular systolic pressure is 42.2 mmHg.   3. The mitral valve has been repaired/replaced. Mild mitral valve regurgitation. No evidence of  mitral stenosis. mean gradient 3 mm Hg s/p prostetic annuloplasty ring   4. Left atrial size was severely dilated.   5. Tricuspid valve regurgitation is mild to moderate.   6. The aortic valve is tricuspid. Aortic valve regurgitation is not visualized. No aortic stenosis is present.   7. The inferior vena cava is normal in size with greater than 50% respiratory variability, suggesting right atrial pressure of 3 mmHg.   Comparison(s): 10/20/20: 55-60%, mild LVH, LAE, MV gradient Hx MVR with  prostetic annuloplasty ring.    Coronary CTA, 12/22/2022 1. Normal coronary calcium  score of 0.  2. Normal coronary origin with right dominance.  3. No evidence of CAD.  4. CAD-RADS 0. No evidence of CAD (0%). Consider non-atherosclerotic causes of chest pain.   TTE, 10/20/2020  1. Left ventricular ejection fraction, by estimation, is 55 to 60%. The left ventricle has normal function. The left ventricle has no regional wall motion abnormalities. There is moderate left ventricular hypertrophy. Left ventricular diastolic parameters are indeterminate.   2. Right ventricular systolic function is normal. The right ventricular size is normal. Mildly increased right ventricular wall thickness. There is normal pulmonary artery systolic pressure.   3. Left atrial size was severely dilated.   4. The mitral valve has been repaired/replaced. Mild mitral valve regurgitation. No evidence of mitral stenosis. The mean mitral valve gradient is 3.0 mmHg. There is a prosthetic annuloplasty ring present in the mitral position.   5. The aortic valve is tricuspid. Aortic valve regurgitation is not visualized. No aortic stenosis is present.    TEE, 10/07/2014 LVEF 10-15%  Assessment and Plan     #) Perm afib #) amiodarone  monitoring Severely dilated LA, not ablation candidate DCCV 04/2023, unclear when she converted back to AFib/flutter Do not recommend repeat DCCV given that she is very minimally symptomatic and I am very doubtful she will hold sinus rhythm for a significant amount of time Will now deem her AFib permanent Ventricular rates well-controlled Continue 100mg  amiodarone  daily Continue 3.125mg  coreg  BID Update LFT, thyroid  labs today, and repeat every 6 months  #) Hypercoag d/t afib CHA2DS2-VASc Score = at least 5 [CHF History: 1, HTN History: 1, Diabetes History: 0, Stroke History: 0, Vascular Disease History: 0, Age Score: 2, Gender Score: 1].  Therefore, the patient's annual risk of stroke is 7.2  %.    Stroke ppx - warfarin, managed at [facility] office No bleeding concerns   #) HFimpEF #) valvular heart disease S/p MVr/MAZE, LAAL Warm and euvolemic on exam NYHA II TTE 12/2022 with LVEF 60-65% Continue coreg  3.125mg  BID, losartan  25mg   Continue 20mg  lasix  daily        Current medicines are reviewed at length with the patient today.   The patient does not have concerns regarding her medicines.  The following changes were made today:  none  Labs/ tests ordered today include:  Orders Placed This Encounter  Procedures   Hepatic function panel   TSH   T4, [name]   EKG 12-Lead     Disposition: Follow up with Dr. Cindie or EP APP in 12 months   Follow-up with general cardiology in ~6 months. Recommend updated LFT, thyroid  labs at that visit  Signed, Chantal Needle, NP  04/26/24  10:34 AM  Electrophysiology [facility]

## 2024-04-26 ENCOUNTER — Ambulatory Visit: Attending: Cardiology | Admitting: Cardiology

## 2024-04-26 VITALS — BP 126/82 | HR 71 | Ht 67.0 in | Wt 153.8 lb

## 2024-04-26 DIAGNOSIS — I5032 Chronic diastolic (congestive) heart failure: Secondary | ICD-10-CM | POA: Diagnosis not present

## 2024-04-26 DIAGNOSIS — Z79899 Other long term (current) drug therapy: Secondary | ICD-10-CM | POA: Diagnosis not present

## 2024-04-26 DIAGNOSIS — I4821 Permanent atrial fibrillation: Secondary | ICD-10-CM

## 2024-04-26 DIAGNOSIS — I4819 Other persistent atrial fibrillation: Secondary | ICD-10-CM

## 2024-04-26 NOTE — Patient Instructions (Addendum)
 Medication Instructions:  Your physician recommends that you continue on your current medications as directed. Please refer to the Current Medication list given to you today.   *If you need a refill on your cardiac medications before your next appointment, please call your pharmacy*  Lab Work: Your provider would like for you to have following labs drawn today LFT, TSH, T4.   If you have labs (blood work) drawn today and your tests are completely normal, you will receive your results only by: MyChart Message (if you have MyChart) OR A paper copy in the mail If you have any lab test that is abnormal or we need to change your treatment, we will call you to review the results.  Testing/Procedures: No test ordered today   Follow-Up: At [facility], you and your health needs are our priority.  As part of our continuing mission to provide you with exceptional heart care, our providers are all part of one team.  This team includes your primary Cardiologist (physician) and Advanced Practice Providers or APPs (Physician Assistants and Nurse Practitioners) who all work together to provide you with the care you need, when you need it.  Your next appointment:   1 year(s)  Provider:   Suzann Riddle, NP    We recommend signing up for the patient portal called MyChart.  Sign up information is provided on this After Visit Summary.  MyChart is used to connect with patients for Virtual Visits (Telemedicine).  Patients are able to view lab/test results, encounter notes, upcoming appointments, etc.  Non-urgent messages can be sent to your provider as well.   To learn more about what you can do with MyChart, go to [email or web address].   Other Instructions Contact Marcey Jungling, DO for consideration of colonoscopy [phone]

## 2024-04-27 LAB — HEPATIC FUNCTION PANEL
ALT: 36 IU/L — ABNORMAL HIGH (ref 0–32)
AST: 34 IU/L (ref 0–40)
Albumin: 4.2 g/dL (ref 3.8–4.8)
Alkaline Phosphatase: 69 IU/L (ref 44–121)
Bilirubin Total: 0.6 mg/dL (ref 0.0–1.2)
Bilirubin, Direct: 0.23 mg/dL (ref 0.00–0.40)
Total Protein: 6 g/dL (ref 6.0–8.5)

## 2024-04-27 LAB — T4, FREE: Free T4: 1.96 ng/dL — ABNORMAL HIGH (ref 0.82–1.77)

## 2024-04-27 LAB — TSH: TSH: 1.14 u[IU]/mL (ref 0.450–4.500)

## 2024-04-30 ENCOUNTER — Ambulatory Visit: Payer: Self-pay | Admitting: Cardiology

## 2024-05-01 ENCOUNTER — Other Ambulatory Visit: Payer: Self-pay | Admitting: Family Medicine

## 2024-05-08 ENCOUNTER — Ambulatory Visit: Attending: Internal Medicine

## 2024-05-08 DIAGNOSIS — I480 Paroxysmal atrial fibrillation: Secondary | ICD-10-CM

## 2024-05-08 DIAGNOSIS — Z5181 Encounter for therapeutic drug level monitoring: Secondary | ICD-10-CM | POA: Diagnosis not present

## 2024-05-08 LAB — POCT INR: INR: 2.4 (ref 2.0–3.0)

## 2024-05-08 NOTE — Patient Instructions (Signed)
 Continue 1 tablet daily except 1/2 tablet on Mondays and Fridays.  Repeat INR in 6 weeks  Call Coumadin  Clinic with any new medications or antibiotics. [phone]

## 2024-05-13 ENCOUNTER — Other Ambulatory Visit: Payer: Self-pay | Admitting: Family Medicine

## 2024-05-13 NOTE — Telephone Encounter (Signed)
 Last office visit 02/26/24 for pneumonia. Last refill 01/11/24 # 20 w/ 0 refills. Next office visit: nothing scheduled.

## 2024-06-09 NOTE — Progress Notes (Signed)
 "    Ayrabella Labombard T. Khyle Goodell, MD, CAQ Sports Medicine [facility] at [facility] [address] [place], [address]  Phone: [phone]  FAX: [phone]  ANNIYAH MOOD - 78 y.o. female  MRN [id number]  Date of Birth: November 17, 1945  Date: 06/12/2024  PCP: Watt Mirza, MD  Referral: Watt Mirza, MD  Chief Complaint  Patient presents with   Knee Pain    Bilateral Knee Injections/Right is worse   Leg Cramps   Sinusitis    Puffy under eyes/Nose runs all the time   Subjective:   [name] is a 78 y.o. very pleasant female patient with Body mass index is 23.87 kg/m. who presents with the following:  Discussed the use of AI scribe software for clinical note transcription with the patient, who gave verbal consent to proceed.  Ms. Delayne presents for follow-up chronic knee pain and chronic osteoarthritis. History of Present Illness ROISIN MONES is a 78 year old female with chronic bilateral knee osteoarthritis who presents with knee pain exacerbation.  Her knees are flared up again, with the right knee being worse than the left. The pain is described as 'awful.' Both knees hurt every day. She is inquiring about the possibility of receiving injections, noting that the last injection was in June.  She experiences leg cramps at night, particularly on the left side. The cramps are severe enough to turn her foot inward and occur more frequently with increased activity or lifting. The cramps radiate down her legs and cause the muscles to harden. No use of diuretics like Lasix .  She is having issues with her sinuses, including a constantly running nose and puffy eyes. These symptoms have persisted throughout the summer. She has previously used antihistamines like Zyrtec and nasal sprays like Flonase , which she continues to use once a day.  She remains very active, engaging in a lot of walking and daily activities, which she believes might contribute to the leg  cramps.    Review of Systems is noted in the HPI, as appropriate  Objective:   BP 90/60   Pulse 79   Temp (!) 97.5 F (36.4 C) (Temporal)   Ht 5' 7 (1.702 m)   Wt 152 lb 6 oz (69.1 kg)   SpO2 100%   BMI 23.87 kg/m   GEN: No acute distress; alert,appropriate. PULM: Breathing comfortably in no respiratory distress PSYCH: Normally interactive.   Bilateral knees with medial and lateral joint line tenderness Modest effusion ACL, PCL, MCL, and LCL are intact Deep flexion causes pain  Laboratory and Imaging Data:  Assessment and Plan:     ICD-10-CM   1. Primary osteoarthritis of knees, bilateral  M17.0 triamcinolone  acetonide (KENALOG -40) injection 40 mg    triamcinolone  acetonide (KENALOG -40) injection 40 mg    2. Non-seasonal allergic rhinitis due to pollen  J30.1     3. Leg cramping  R25.2      Assessment & Plan Bilateral primary knee osteoarthritis with acute exacerbation Acute exacerbation of chronic bilateral knee osteoarthritis, right knee more affected. Significant pain and movement difficulty reported. - Administer injections to both knees for osteoarthritis management.  Leg cramps Nocturnal leg cramps causing discomfort and involuntary foot movement, likely due to muscle fatigue. No diuretics involved. - Advise on stretching exercises focusing on the back of the leg to alleviate cramps. - Encourage continued physical activity while being mindful of overexertion.  Chronic sinus symptoms Chronic sinus symptoms with nasal congestion, rhinorrhea, and puffy eyes. Currently using  Flonase  and Zyrtec. - Continue using Flonase  nasal spray once daily. - Consider the use of antihistamines like Zyrtec for additional symptom relief.  Trial of Allegra or Claritin.  Aspiration/Injection Procedure Note CHARNELLE BERGEMAN 03-Sep-1945 Date of procedure: 06/12/2024  Procedure: Large Joint Joint Aspiration / Injection of the Right Knee Indications: Pain  Procedure  Details Patient verbally consented to procedure. Risks, benefits, and alternatives explained. Sterilely prepped with Chloraprep. Ethyl cholride used for anesthesia. 9 cc Lidocaine  1% mixed with 1 mL Kenalog  40 mg injected using the anteromedial approach without difficulty. No complications with procedure and tolerated well. Patient had decreased pain post-injection.  Medication: 1 mL of Kenalog  40 mg  Aspiration/Injection Procedure Note ADELL KOVAL 1945-10-24 Date of procedure: 06/12/2024  Procedure: Large Joint Aspiration / Injection of the Left Knee Indications: Pain  Procedure Details Patient verbally consented to procedure. Risks, benefits, and alternatives explained. Sterilely prepped with Chloraprep. Ethyl cholride used for anesthesia. 9 cc Lidocaine  1% mixed with 1 mL Kenalog  40 mg injected using the anteromedial approach without difficulty. No complications with procedure and tolerated well. Patient had decreased pain post-injection.  Medication: 1 mL of Kenalog  40 mg   Medication Management during today's office visit: Meds ordered this encounter  Medications   triamcinolone  acetonide (KENALOG -40) injection 40 mg   triamcinolone  acetonide (KENALOG -40) injection 40 mg   There are no discontinued medications.  Orders placed today for conditions managed today: No orders of the defined types were placed in this encounter.   Disposition: No follow-ups on file.  [employer] One speech-to-text software was used for transcription in this dictation.  Possible transcriptional errors can occur using Animal nutritionist.   Signed,  Jacques DASEN. Wille Aubuchon, MD   Outpatient Encounter Medications as of 06/12/2024  Medication Sig   acetaminophen  (TYLENOL ) 500 MG tablet Take 1,000 mg by mouth every 8 (eight) hours as needed.   amiodarone  (PACERONE ) 200 MG tablet Take 100 mg by mouth daily.   benzonatate  (TESSALON ) 200 MG capsule Take 1 capsule (200 mg total) by mouth 3 (three) times  daily as needed.   BIOTIN PO Take 1 tablet by mouth daily.   Calcium  Carbonate (CALCIUM  600 PO) Take 1 tablet by mouth daily.   Calcium  Citrate-Vitamin D  (CALCIUM  + D PO) Take 1 capsule by mouth daily.   carvedilol  (COREG ) 3.125 MG tablet TAKE ONE TABLET (3.125 MG TOTAL) BY MOUTH TWICE DAILY.   cetirizine (ZYRTEC) 10 MG tablet Take 10 mg by mouth daily.   Cholecalciferol (VITAMIN D -3) 125 MCG (5000 UT) TABS Take 1 tablet by mouth daily.   cyanocobalamin  (VITAMIN B12) 1000 MCG tablet Take 1,000 mcg by mouth daily.   donepezil  (ARICEPT ) 5 MG tablet TAKE ONE TABLET BY MOUTH DAILY AT BEDTIME   fluticasone  (FLONASE ) 50 MCG/ACT nasal spray PLACE TWO SPRAYS INTO BOTH NOSTRILS DAILY   IRON, FERROUS SULFATE, PO Take 1 tablet by mouth daily.   levothyroxine  (SYNTHROID ) 75 MCG tablet TAKE ONE TAB BY MOUTH ONCE DAILY. TAKE ON AN EMPTY STOMACH WITH A GLASS OF WATER ATLEAST 30-60 MINUTES BEFORE BREAKFAST   losartan  (COZAAR ) 25 MG tablet TAKE ONE TABLET BY MOUTH ONCE DAILY   Melatonin 5 MG CAPS Take 1 capsule by mouth at bedtime.   metroNIDAZOLE  (METROCREAM ) 0.75 % cream Apply topically 2 (two) times daily.   nitrofurantoin , macrocrystal-monohydrate, (MACROBID ) 100 MG capsule Take 1 capsule (100 mg total) by mouth daily.   Omega-3 Fatty Acids (FISH OIL PO) Take 1 capsule by mouth daily.  pantoprazole  (PROTONIX ) 40 MG tablet TAKE ONE TABLET BY MOUTH DAILY   permethrin (ELIMITE) 5 % cream APPLY PEA SIZED AMOUNT TO FACE ONCE A DAY   rosuvastatin  (CRESTOR ) 10 MG tablet TAKE ONE TABLET BY MOUTH ONCE DAILY   traMADol  (ULTRAM ) 50 MG tablet Take 1 tablet (50 mg total) by mouth every 6 (six) hours as needed for moderate pain (pain score 4-6).   traZODone  (DESYREL ) 50 MG tablet TAKE ONE HALF (1/2) TO ONE TABLET BY MOUTH AT BEDTIME AS NEEDED FOR SLEEP.   warfarin (COUMADIN ) 5 MG tablet TAKE ONE HALF (1/2) TO ONE TABLET BY MOUTH DAILY AS DIRECTED BY COUMADIN  CLINIC   [EXPIRED] triamcinolone   acetonide (KENALOG -40) injection 40 mg    [EXPIRED] triamcinolone  acetonide (KENALOG -40) injection 40 mg    No facility-administered encounter medications on file as of 06/12/2024.   "

## 2024-06-12 ENCOUNTER — Encounter: Payer: Self-pay | Admitting: Family Medicine

## 2024-06-12 ENCOUNTER — Ambulatory Visit: Admitting: Family Medicine

## 2024-06-12 VITALS — BP 90/60 | HR 79 | Temp 97.5°F | Ht 67.0 in | Wt 152.4 lb

## 2024-06-12 DIAGNOSIS — M170 Bilateral primary osteoarthritis of knee: Secondary | ICD-10-CM | POA: Diagnosis not present

## 2024-06-12 DIAGNOSIS — R252 Cramp and spasm: Secondary | ICD-10-CM

## 2024-06-12 DIAGNOSIS — M1711 Unilateral primary osteoarthritis, right knee: Secondary | ICD-10-CM

## 2024-06-12 DIAGNOSIS — J301 Allergic rhinitis due to pollen: Secondary | ICD-10-CM

## 2024-06-12 MED ORDER — TRIAMCINOLONE ACETONIDE 40 MG/ML IJ SUSP
40.0000 mg | Freq: Once | INTRAMUSCULAR | Status: AC
  Administered 2024-06-12: 40 mg via INTRA_ARTICULAR

## 2024-06-19 ENCOUNTER — Ambulatory Visit: Attending: Internal Medicine

## 2024-06-19 DIAGNOSIS — Z5181 Encounter for therapeutic drug level monitoring: Secondary | ICD-10-CM

## 2024-06-19 DIAGNOSIS — I480 Paroxysmal atrial fibrillation: Secondary | ICD-10-CM

## 2024-06-19 LAB — POCT INR: INR: 2.6 (ref 2.0–3.0)

## 2024-06-19 NOTE — Patient Instructions (Signed)
 Continue 1 tablet daily except 1/2 tablet on Mondays and Fridays.  Repeat INR in 6 weeks  Call Coumadin  Clinic with any new medications or antibiotics. [phone]

## 2024-07-18 ENCOUNTER — Other Ambulatory Visit: Payer: Self-pay | Admitting: Cardiology

## 2024-07-18 DIAGNOSIS — I4819 Other persistent atrial fibrillation: Secondary | ICD-10-CM

## 2024-07-27 ENCOUNTER — Other Ambulatory Visit: Payer: Self-pay | Admitting: Family Medicine

## 2024-07-31 ENCOUNTER — Ambulatory Visit: Attending: Internal Medicine

## 2024-07-31 DIAGNOSIS — I480 Paroxysmal atrial fibrillation: Secondary | ICD-10-CM

## 2024-07-31 DIAGNOSIS — Z5181 Encounter for therapeutic drug level monitoring: Secondary | ICD-10-CM | POA: Diagnosis not present

## 2024-07-31 LAB — POCT INR: INR: 1.3 — AB (ref 2.0–3.0)

## 2024-07-31 NOTE — Patient Instructions (Signed)
 Take 2 tablets tonight only then Continue 1 tablet daily except 1/2 tablet on Mondays and Fridays.  Repeat INR in 5 weeks  Call Coumadin  Clinic with any new medications or antibiotics. [phone]

## 2024-08-12 ENCOUNTER — Ambulatory Visit: Payer: Medicare HMO

## 2024-08-12 VITALS — BP 96/60 | Ht 67.0 in | Wt 153.0 lb

## 2024-08-12 DIAGNOSIS — Z0000 Encounter for general adult medical examination without abnormal findings: Secondary | ICD-10-CM

## 2024-08-12 NOTE — Patient Instructions (Signed)
 [name],  Thank you for taking the time for your Medicare Wellness Visit. I appreciate your continued commitment to your health goals. Please review the care plan we discussed, and feel free to reach out if I can assist you further.  Please note that Annual Wellness Visits do not include a physical exam. Some assessments may be limited, especially if the visit was conducted virtually. If needed, we may recommend an in-person follow-up with your provider.  Ongoing Care Seeing your primary care provider every 3 to 6 months helps us  monitor your health and provide consistent, personalized care.   Referrals If a referral was made during today's visit and you haven't received any updates within two weeks, please contact the referred provider directly to check on the status.  Recommended Screenings:  Health Maintenance  Topic Date Due   COVID-19 Vaccine (6 - 2025-26 season) 04/29/2024   Medicare Annual Wellness Visit  08/09/2024   Flu Shot  11/26/2024*   DTaP/Tdap/Td vaccine (2 - Tdap) 02/26/2029   Pneumococcal Vaccine for age over 71  Completed   Osteoporosis screening with Bone Density Scan  Completed   Hepatitis C Screening  Completed   Zoster (Shingles) Vaccine  Completed   Meningitis B Vaccine  Aged Out   Breast Cancer Screening  Discontinued   Colon Cancer Screening  Discontinued  *Topic was postponed. The date shown is not the original due date.       08/12/2024   10:55 AM  Advanced Directives  Does Patient Have a Medical Advance Directive? No  Would patient like information on creating a medical advance directive? No - Patient declined    Vision: Annual vision screenings are recommended for early detection of glaucoma, cataracts, and diabetic retinopathy. These exams can also reveal signs of chronic conditions such as diabetes and high blood pressure.  Dental: Annual dental screenings help detect early signs of oral cancer, gum disease, and other conditions linked to  overall health, including heart disease and diabetes.  Please see the attached documents for additional preventive care recommendations.

## 2024-08-12 NOTE — Progress Notes (Signed)
 I connected with  [name] on 08/12/2024 by a audio enabled telemedicine application and verified that I am speaking with the correct person using two identifiers.  Patient Location: Home  Provider Location: Home Office  Persons Participating in Visit: Patient.  I discussed the limitations of evaluation and management by telemedicine. The patient expressed understanding and agreed to proceed.  Vital Signs: Because this visit was a virtual/telehealth visit, some criteria may be missing or patient reported. Any vitals not documented were not able to be obtained and vitals that have been documented are patient reported.  This visit was performed by a medical professional under my direct supervision. I was immediately available for consultation/collaboration. I have reviewed and agree with the Annual Wellness Visit documentation.  Chief Complaint  Patient presents with   Medicare Wellness     Subjective:   [name] is a 78 y.o. female who presents for a Medicare Annual Wellness Visit.  Visit info / Clinical Intake: Medicare Wellness Visit Type:: Subsequent Annual Wellness Visit Persons participating in visit and providing information:: patient Medicare Wellness Visit Mode:: Telephone If telephone:: video declined If Telephone or Video please confirm:: I connected with patient using audio/video enable telemedicine. I verified patient identity with two identifiers, discussed telehealth limitations, and patient agreed to proceed. Patient Location:: home Provider Location:: home office Interpreter Needed?: No Pre-visit prep was completed: yes AWV questionnaire completed by patient prior to visit?: no Living arrangements:: lives with spouse/significant other Patient's Overall Health Status Rating: very good Typical amount of pain: some Does pain affect daily life?: (!) yes Are you currently prescribed opioids?: (!) yes  Dietary Habits and Nutritional Risks How many meals a  day?: 2 Eats fruit and vegetables daily?: yes Most meals are obtained by: preparing own meals In the last 2 weeks, have you had any of the following?: none Diabetic:: no  Functional Status Activities of Daily Living (to include ambulation/medication): Independent Ambulation: Independent Medication Administration: Independent Home Management (perform basic housework or laundry): Independent Manage your own finances?: yes Primary transportation is: driving Concerns about vision?: no *vision screening is required for WTM* Concerns about hearing?: (!) yes Uses hearing aids?: (!) yes Hear whispered voice?: yes  Fall Screening Falls in the past year?: 0 Number of falls in past year: 0 Was there an injury with Fall?: 0 Fall Risk Category Calculator: 0 Patient Fall Risk Level: Low Fall Risk  Fall Risk Patient at Risk for Falls Due to: No Fall Risks Fall risk Follow up: Falls evaluation completed  Home and Transportation Safety: All rugs have non-skid backing?: yes All stairs or steps have railings?: yes Grab bars in the bathtub or shower?: yes Have non-skid surface in bathtub or shower?: yes Good home lighting?: yes Regular seat belt use?: yes Hospital stays in the last year:: no  Cognitive Assessment Difficulty concentrating, remembering, or making decisions? : no Will 6CIT or Mini Cog be Completed: yes What year is it?: 0 points What month is it?: 0 points Give patient an address phrase to remember (5 components): remember words apple , table, penny About what time is it?: 0 points Count backwards from 20 to 1: 0 points Say the months of the year in reverse: 0 points Repeat the address phrase from earlier: 0 points 6 CIT Score: 0 points  Advance Directives (For Healthcare) Does Patient Have a Medical Advance Directive?: No Would patient like information on creating a medical advance directive?: No - Patient declined  Reviewed/Updated  Reviewed/Updated: Reviewed All  (  Medical, Surgical, Family, Medications, Allergies, Care Teams, Patient Goals)    Allergies (verified) Patient has no known allergies.   Current Medications (verified) Outpatient Encounter Medications as of 08/12/2024  Medication Sig   acetaminophen  (TYLENOL ) 500 MG tablet Take 1,000 mg by mouth every 8 (eight) hours as needed.   amiodarone  (PACERONE ) 200 MG tablet Take 100 mg by mouth daily.   benzonatate  (TESSALON ) 200 MG capsule Take 1 capsule (200 mg total) by mouth 3 (three) times daily as needed.   BIOTIN PO Take 1 tablet by mouth daily.   Calcium  Carbonate (CALCIUM  600 PO) Take 1 tablet by mouth daily.   Calcium  Citrate-Vitamin D  (CALCIUM  + D PO) Take 1 capsule by mouth daily.   carvedilol  (COREG ) 3.125 MG tablet TAKE ONE TABLET (3.125 MG TOTAL) BY MOUTH TWICE DAILY.   cetirizine (ZYRTEC) 10 MG tablet Take 10 mg by mouth daily.   Cholecalciferol (VITAMIN D -3) 125 MCG (5000 UT) TABS Take 1 tablet by mouth daily.   cyanocobalamin  (VITAMIN B12) 1000 MCG tablet Take 1,000 mcg by mouth daily.   donepezil  (ARICEPT ) 5 MG tablet TAKE ONE TABLET BY MOUTH DAILY AT BEDTIME   fluticasone  (FLONASE ) 50 MCG/ACT nasal spray PLACE TWO SPRAYS INTO BOTH NOSTRILS DAILY   IRON, FERROUS SULFATE, PO Take 1 tablet by mouth daily.   levothyroxine  (SYNTHROID ) 75 MCG tablet TAKE ONE TAB BY MOUTH ONCE DAILY. TAKE ON AN EMPTY STOMACH WITH A GLASS OF WATER ATLEAST 30-60 MINUTES BEFORE BREAKFAST   losartan  (COZAAR ) 25 MG tablet TAKE ONE TABLET BY MOUTH ONCE DAILY   Melatonin 5 MG CAPS Take 1 capsule by mouth at bedtime.   metroNIDAZOLE  (METROCREAM ) 0.75 % cream Apply topically 2 (two) times daily.   nitrofurantoin , macrocrystal-monohydrate, (MACROBID ) 100 MG capsule Take 1 capsule (100 mg total) by mouth daily.   Omega-3 Fatty Acids (FISH OIL PO) Take 1 capsule by mouth daily.   pantoprazole  (PROTONIX ) 40 MG tablet TAKE ONE TABLET BY MOUTH DAILY   permethrin (ELIMITE) 5 % cream APPLY PEA SIZED AMOUNT TO FACE  ONCE A DAY   rosuvastatin  (CRESTOR ) 10 MG tablet TAKE ONE TABLET BY MOUTH ONCE DAILY   traMADol  (ULTRAM ) 50 MG tablet Take 1 tablet (50 mg total) by mouth every 6 (six) hours as needed for moderate pain (pain score 4-6).   traZODone  (DESYREL ) 50 MG tablet TAKE ONE HALF (1/2) TO ONE TABLET BY MOUTH AT BEDTIME AS NEEDED FOR SLEEP.   warfarin (COUMADIN ) 5 MG tablet TAKE ONE HALF (1/2) TO ONE TABLET BY MOUTH DAILY AS DIRECTED BY COUMADIN  CLINIC   No facility-administered encounter medications on file as of 08/12/2024.    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   a) [facility] Echo: EF ~10% Severe LV dilation & global LV Systolic dysfxn, restrictive filling pattern - Gr 3 DD, mod RV dysfxn, severe LA dilation - 6.4  cm, mod RA dil, mod pl effusion, mod MR, mild TR; b) TEE 10/07/14: EF ~10%, Severe LV dilation & dysfxn Mod RV dysfxn, LAA oversewn, Mod RA & TR   Diverticulosis    H/O: rheumatic fever    HYPERLIPIDEMIA 04/05/2010   Qualifier: Diagnosis of  By: Watt MD, Spencer     Hypothyroid    Migraine headache    Osteopenia    Paroxysmal atrial fibrillation (HCC) 2005; Recurrent 09/2014   a) 2005: s/p Cox Maze & LAA ligation; b) 09/2014: TEE-cardioversion   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  Posterior MV leaflet is fixed and immobile.  MAC.  reduced excursion of the anterior MV leaflet.  Mean MV gradient was 3mm Hg and MVA calculated at cm2.      Urinary incontinence    Past Surgical History:  Procedure Laterality Date   ABDOMINAL HYSTERECTOMY     CARDIAC CATHETERIZATION  Jan 2005   Pre-op R&LHC -- Nonobstructive CAD; normal PA pressures   CARDIOVERSION  06/04/2012   Procedure: CARDIOVERSION;  Surgeon: Reyes JONETTA Forget, MD;  Location: [facility] ENDOSCOPY;  Service: Cardiovascular;  Laterality: N/A;   CARDIOVERSION N/A 10/07/2014   Procedure: CARDIOVERSION;  Surgeon: Wilbert JONELLE Bihari, MD;  Location: [facility] ENDOSCOPY;  Service: Cardiovascular;  Laterality: N/A;   CARDIOVERSION N/A 05/12/2023   Procedure: CARDIOVERSION;  Surgeon: Darliss Rogue, MD;  Location: [facility] ORS;  Service: Cardiovascular;  Laterality: N/A;   COLONOSCOPY WITH PROPOFOL  N/A 07/11/2017   Procedure: COLONOSCOPY WITH PROPOFOL ;  Surgeon: Toledo, Ladell POUR, MD;  Location: [facility] ENDOSCOPY;  Service: Gastroenterology;  Laterality: N/A;   COLONOSCOPY WITH PROPOFOL  N/A 05/17/2022   Procedure: COLONOSCOPY WITH PROPOFOL ;  Surgeon: Jinny Carmine, MD;  Location: [facility] ENDOSCOPY;  Service: Endoscopy;  Laterality: N/A;   LUMBAR DISC SURGERY     x 2 distantly   MITRAL VALVE REPAIR  2005   with Cox Maze for Carmax   NM MYOVIEW LTD  4/7/'16   EF 37% with septal hypokinesis and diffuse hypokinesis. No ischemia or infarction. Read as intermediate risk secondary to decreased EF consistent with nonischemic cardiac myopathy.   RIGHT HEART CATHETERIZATION N/A 10/03/2014   Procedure: RIGHT HEART CATH;  Surgeon: Toribio JONELLE Fuel, MD;  Location: [facility] CATH LAB;  Service: Cardiovascular;  RAP , RVP 35/2/9 mmHg, PAP 45/12 mmHg, PCWP 16 mmHg; CO/I by Fick: 3.9/2.3; Ao/PA/SVC SaO2%: 97%/63%/65%.   TEE WITHOUT CARDIOVERSION N/A  10/07/2014   Procedure: TRANSESOPHAGEAL ECHOCARDIOGRAM (TEE);  Surgeon: Wilbert JONELLE Bihari, MD;  Location: [facility] ENDOSCOPY;  Service: Cardiovascular;  Laterality: N/A;   THYROIDECTOMY, PARTIAL     partial-no cancer; now on thyroid  replacement   TRANSTHORACIC ECHOCARDIOGRAM  2013; 2/'16 & 5/'16   a) 2013: EF 60-65%, mild MR, Gr 2 DD; b) 2/'16 @ [facility]: EF ~10% - severel global LV dysfunction (systolic & diastolic) - dilated LV & restrictive filling pattern - Gr 3 DD, mod reduced RV function, severely dilated LA - 6.4 cm, mod dilated RA, mod pl effusion, mod MR, mild TR; c) 5/10/'16:  EF 25-30%, mod LV dilation, no RWMA,Gr 3 DD w/ high LAP, MV sewing ring intact w/ Mod MR, Severe LA dilation   TRANSTHORACIC ECHOCARDIOGRAM  January 2017:   EF improved to 45-50%. Mild diffuse HK. Mild MR. Moderate LA dilation. Normal PA pressures.  Family History  Problem Relation Age of Onset   Diabetes Mother    Coronary artery disease Mother    Kidney disease Mother    Breast cancer Neg Hx    Social History   Occupational History   Occupation: Retired  Tobacco Use   Smoking status: Never   Smokeless tobacco: Never  Vaping Use   Vaping status: Never Used  Substance and Sexual Activity   Alcohol use: Not Currently   Drug use: No   Sexual activity: Yes   Tobacco Counseling Counseling given: Not Answered  SDOH Screenings   Food Insecurity: No Food Insecurity (08/12/2024)  Housing: Unknown (08/12/2024)  Transportation Needs: No Transportation Needs (08/12/2024)  Utilities: Not At Risk (08/12/2024)  Alcohol Screen: Low Risk (08/10/2023)  Depression (PHQ2-9): Low Risk (08/12/2024)  Financial Resource Strain: Low Risk  (10/12/2023)   Received from [facility]  Physical Activity: Sufficiently Active (08/12/2024)  Social Connections: Socially Integrated (08/12/2024)  Stress: No Stress Concern Present (08/12/2024)  Tobacco Use: Low Risk (08/12/2024)  Health Literacy: Adequate Health Literacy  (08/12/2024)   See flowsheets for full screening details  Depression Screen PHQ 2 & 9 Depression Scale- Over the past 2 weeks, how often have you been bothered by any of the following problems? Little interest or pleasure in doing things: 0 Feeling down, depressed, or hopeless (PHQ Adolescent also includes...irritable): 0 PHQ-2 Total Score: 0 Trouble falling or staying asleep, or sleeping too much: 0 Feeling tired or having little energy: 0 Poor appetite or overeating (PHQ Adolescent also includes...weight loss): 0 Feeling bad about yourself - or that you are a failure or have let yourself or your family down: 0 Trouble concentrating on things, such as reading the newspaper or watching television (PHQ Adolescent also includes...like school work): 0 Moving or speaking so slowly that other people could have noticed. Or the opposite - being so fidgety or restless that you have been moving around a lot more than usual: 0 Thoughts that you would be better off dead, or of hurting yourself in some way: 0 PHQ-9 Total Score: 0 If you checked off any problems, how difficult have these problems made it for you to do your work, take care of things at home, or get along with other people?: Not difficult at all  Depression Treatment Depression Interventions/Treatment : EYV7-0 Score <4 Follow-up Not Indicated     Goals Addressed               This Visit's Progress     Increase water intake (pt-stated)   On track      I will continue to drink at least 6-8 glasses of water daily.       Patient Stated   On track     05/27/2019, Patient wants to improve her strength and be able to walk better.             Objective:    Today's Vitals   08/12/24 1047  BP: 96/60  Weight: 153 lb (69.4 kg)  Height: 5' 7 (1.702 m)   Body mass index is 23.96 kg/m.  Hearing/Vision screen Hearing Screening - Comments:: Wears hearing aids Vision Screening - Comments:: Patient wears readers Immunizations  and Health Maintenance Health Maintenance  Topic Date Due   COVID-19 Vaccine (6 - 2025-26 season) 04/29/2024   Medicare Annual Wellness (AWV)  08/09/2024   Influenza Vaccine  11/26/2024 (Originally 03/29/2024)   DTaP/Tdap/Td (2 - Tdap) 02/26/2029   Pneumococcal Vaccine: 50+ Years  Completed  Bone Density Scan  Completed   Hepatitis C Screening  Completed   Zoster Vaccines- Shingrix  Completed   Meningococcal B Vaccine  Aged Out   Mammogram  Discontinued   Colonoscopy  Discontinued        Assessment/Plan:  This is a routine wellness examination for Timiko.  Patient Care Team: Watt Mirza, MD as PCP - General (Family Medicine) End, Lonni, MD as PCP - Cardiology (Cardiology) Cindie Ole DASEN, MD as PCP - Electrophysiology (Cardiology) Pa, [facility]  I have personally reviewed and noted the following in the patients chart:   Medical and social history Use of alcohol, tobacco or illicit drugs  Current medications and supplements including opioid prescriptions. Functional ability and status Nutritional status Physical activity Advanced directives List of other physicians Hospitalizations, surgeries, and ER visits in previous 12 months Vitals Screenings to include cognitive, depression, and falls Referrals and appointments  No orders of the defined types were placed in this encounter.  In addition, I have reviewed and discussed with patient certain preventive protocols, quality metrics, and best practice recommendations. A written personalized care plan for preventive services as well as general preventive health recommendations were provided to patient.   Lyle MARLA Right, CMA   08/12/2024   No follow-ups on file.  After Visit Summary: (MyChart) Due to this being a telephonic visit, the after visit summary with patients personalized plan was offered to patient via MyChart   No voiced or noted concerns at this time Vaccines not given: covid  vaccine declined today

## 2024-08-18 NOTE — Progress Notes (Unsigned)
 "    Mehki Klumpp T. Lucus Lambertson, MD, CAQ Sports Medicine [facility] [address] [place], [address]  Phone: [phone]  FAX: [phone]  [name] - 78 y.o. female  MRN [id number]  Date of Birth: [birth year]  Date: 08/19/2024  PCP: Watt Mirza, MD  Referral: Watt Mirza, MD  No chief complaint on file.  Subjective:   [name] is a 78 y.o. very pleasant female patient with There is no height or weight on file to calculate BMI. who presents with the following:  Discussed the use of AI scribe software for clinical note transcription with the patient, who gave verbal consent to proceed.  This is [place] is here for follow-up. History of Present Illness     Review of Systems is noted in the HPI, as appropriate  Objective:   There were no vitals taken for this visit.  GEN: No acute distress; alert,appropriate. PULM: Breathing comfortably in no respiratory distress PSYCH: Normally interactive.   Laboratory and Imaging Data:  Assessment and Plan:   No diagnosis found. Assessment & Plan   Medication Management during today's office visit: No orders of the defined types were placed in this encounter.  There are no discontinued medications.  Orders placed today for conditions managed today: No orders of the defined types were placed in this encounter.   Disposition: No follow-ups on file.  [employer] One speech-to-text software was used for transcription in this dictation.  Possible transcriptional errors can occur using Animal nutritionist.   Signed,  Mirza DASEN. Danikah Budzik, MD   Outpatient Encounter Medications as of 08/19/2024  Medication Sig   acetaminophen  (TYLENOL ) 500 MG tablet Take 1,000 mg by mouth every 8 (eight) hours as needed.   amiodarone  (PACERONE ) 200 MG tablet Take 100 mg by mouth daily.   benzonatate  (TESSALON ) 200 MG capsule Take 1 capsule (200 mg total) by mouth 3 (three) times daily as  needed.   BIOTIN PO Take 1 tablet by mouth daily.   Calcium  Carbonate (CALCIUM  600 PO) Take 1 tablet by mouth daily.   Calcium  Citrate-Vitamin D  (CALCIUM  + D PO) Take 1 capsule by mouth daily.   carvedilol  (COREG ) 3.125 MG tablet TAKE ONE TABLET (3.125 MG TOTAL) BY MOUTH TWICE DAILY.   cetirizine (ZYRTEC) 10 MG tablet Take 10 mg by mouth daily.   Cholecalciferol (VITAMIN D -3) 125 MCG (5000 UT) TABS Take 1 tablet by mouth daily.   cyanocobalamin  (VITAMIN B12) 1000 MCG tablet Take 1,000 mcg by mouth daily.   donepezil  (ARICEPT ) 5 MG tablet TAKE ONE TABLET BY MOUTH DAILY AT BEDTIME   fluticasone  (FLONASE ) 50 MCG/ACT nasal spray PLACE TWO SPRAYS INTO BOTH NOSTRILS DAILY   IRON, FERROUS SULFATE, PO Take 1 tablet by mouth daily.   levothyroxine  (SYNTHROID ) 75 MCG tablet TAKE ONE TAB BY MOUTH ONCE DAILY. TAKE ON AN EMPTY STOMACH WITH A GLASS OF WATER ATLEAST 30-60 MINUTES BEFORE BREAKFAST   losartan  (COZAAR ) 25 MG tablet TAKE ONE TABLET BY MOUTH ONCE DAILY   Melatonin 5 MG CAPS Take 1 capsule by mouth at bedtime.   metroNIDAZOLE  (METROCREAM ) 0.75 % cream Apply topically 2 (two) times daily.   nitrofurantoin , macrocrystal-monohydrate, (MACROBID ) 100 MG capsule Take 1 capsule (100 mg total) by mouth daily.   Omega-3 Fatty Acids (FISH OIL PO) Take 1 capsule by mouth daily.   pantoprazole  (PROTONIX ) 40 MG tablet TAKE ONE TABLET BY MOUTH DAILY   permethrin (ELIMITE) 5 % cream APPLY PEA SIZED AMOUNT TO  FACE ONCE A DAY   rosuvastatin  (CRESTOR ) 10 MG tablet TAKE ONE TABLET BY MOUTH ONCE DAILY   traMADol  (ULTRAM ) 50 MG tablet Take 1 tablet (50 mg total) by mouth every 6 (six) hours as needed for moderate pain (pain score 4-6).   traZODone  (DESYREL ) 50 MG tablet TAKE ONE HALF (1/2) TO ONE TABLET BY MOUTH AT BEDTIME AS NEEDED FOR SLEEP.   warfarin (COUMADIN ) 5 MG tablet TAKE ONE HALF (1/2) TO ONE TABLET BY MOUTH DAILY AS DIRECTED BY COUMADIN  CLINIC   No facility-administered encounter medications on file as of  08/19/2024.   "

## 2024-08-19 ENCOUNTER — Encounter: Payer: Self-pay | Admitting: Family Medicine

## 2024-08-19 ENCOUNTER — Ambulatory Visit: Admitting: Family Medicine

## 2024-08-19 ENCOUNTER — Ambulatory Visit
Admission: RE | Admit: 2024-08-19 | Discharge: 2024-08-19 | Disposition: A | Source: Ambulatory Visit | Attending: Family Medicine | Admitting: Family Medicine

## 2024-08-19 VITALS — BP 100/72 | HR 85 | Temp 98.9°F | Ht 67.0 in | Wt 153.5 lb

## 2024-08-19 DIAGNOSIS — M170 Bilateral primary osteoarthritis of knee: Secondary | ICD-10-CM | POA: Diagnosis not present

## 2024-08-19 DIAGNOSIS — Z981 Arthrodesis status: Secondary | ICD-10-CM

## 2024-08-19 DIAGNOSIS — M549 Dorsalgia, unspecified: Secondary | ICD-10-CM

## 2024-08-19 DIAGNOSIS — R300 Dysuria: Secondary | ICD-10-CM

## 2024-08-19 DIAGNOSIS — G8929 Other chronic pain: Secondary | ICD-10-CM | POA: Diagnosis not present

## 2024-08-19 DIAGNOSIS — R197 Diarrhea, unspecified: Secondary | ICD-10-CM

## 2024-08-19 LAB — POC URINALSYSI DIPSTICK (AUTOMATED)
Bilirubin, UA: NEGATIVE
Blood, UA: NEGATIVE
Glucose, UA: NEGATIVE
Ketones, UA: NEGATIVE
Leukocytes, UA: NEGATIVE
Nitrite, UA: NEGATIVE
Protein, UA: NEGATIVE
Spec Grav, UA: 1.02
Urobilinogen, UA: 0.2 U/dL
pH, UA: 6.5

## 2024-08-19 MED ORDER — CEPHALEXIN 500 MG PO CAPS: 500.0000 mg | ORAL_CAPSULE | Freq: Three times a day (TID) | ORAL | 0 refills | Status: AC

## 2024-08-20 ENCOUNTER — Telehealth: Payer: Self-pay

## 2024-08-20 NOTE — Telephone Encounter (Signed)
 done

## 2024-08-20 NOTE — Telephone Encounter (Signed)
 I spoke to patient who started Keflex  12/22 and had her reduce Warfarin dosage and rescheduled her for 12/31 @ 10:00.

## 2024-08-21 ENCOUNTER — Other Ambulatory Visit: Payer: Self-pay

## 2024-08-21 DIAGNOSIS — R197 Diarrhea, unspecified: Secondary | ICD-10-CM

## 2024-08-21 LAB — URINE CULTURE
MICRO NUMBER:: 17386141
SPECIMEN QUALITY:: ADEQUATE

## 2024-08-21 LAB — FECAL OCCULT BLOOD, IMMUNOCHEMICAL: Fecal Occult Bld: NEGATIVE

## 2024-08-23 ENCOUNTER — Ambulatory Visit: Payer: Self-pay | Admitting: Family Medicine

## 2024-08-26 ENCOUNTER — Other Ambulatory Visit: Payer: Self-pay | Admitting: Family Medicine

## 2024-08-26 NOTE — Telephone Encounter (Signed)
 Last office visit 08/19/2024 for dysuria.  Last refilled 02/26/2024 for #90 with 1 refill. Next appt: No future appointments with PCP.

## 2024-08-27 LAB — OVA AND PARASITE EXAMINATION

## 2024-08-27 LAB — STOOL CULTURE: E coli, Shiga toxin Assay: NEGATIVE

## 2024-08-27 LAB — SPECIMEN STATUS REPORT

## 2024-08-28 ENCOUNTER — Ambulatory Visit: Attending: Internal Medicine

## 2024-08-28 DIAGNOSIS — I480 Paroxysmal atrial fibrillation: Secondary | ICD-10-CM

## 2024-08-28 DIAGNOSIS — Z5181 Encounter for therapeutic drug level monitoring: Secondary | ICD-10-CM

## 2024-08-28 LAB — POCT INR: INR: 1.7 — AB (ref 2.0–3.0)

## 2024-08-28 NOTE — Patient Instructions (Signed)
 Take 2 tablets tonight only then Continue 1 tablet daily except 1/2 tablet on Mondays and Fridays.  Repeat INR in 4 weeks  Call Coumadin  Clinic with any new medications or antibiotics. [phone]

## 2024-08-28 NOTE — Telephone Encounter (Signed)
 Copied from CRM [id number]. Topic: Clinical - Lab/Test Results >> Aug 28, 2024 10:34 AM Ashley R wrote: Reason for CRM: Read message re: stool studies. Pt states no mychart access but no further questions at this time

## 2024-09-04 ENCOUNTER — Ambulatory Visit

## 2024-09-05 ENCOUNTER — Other Ambulatory Visit: Payer: Self-pay | Admitting: Family Medicine

## 2024-09-06 ENCOUNTER — Other Ambulatory Visit: Payer: Self-pay | Admitting: Family Medicine

## 2024-09-06 DIAGNOSIS — E039 Hypothyroidism, unspecified: Secondary | ICD-10-CM

## 2024-09-06 DIAGNOSIS — E782 Mixed hyperlipidemia: Secondary | ICD-10-CM

## 2024-09-06 DIAGNOSIS — Z79899 Other long term (current) drug therapy: Secondary | ICD-10-CM

## 2024-09-06 DIAGNOSIS — E559 Vitamin D deficiency, unspecified: Secondary | ICD-10-CM

## 2024-09-06 NOTE — Telephone Encounter (Signed)
 Please schedule CPE with fasting labs prior with Dr. Watt after 10/01/2024.

## 2024-09-25 ENCOUNTER — Ambulatory Visit

## 2024-10-02 ENCOUNTER — Ambulatory Visit

## 2024-10-02 DIAGNOSIS — Z5181 Encounter for therapeutic drug level monitoring: Secondary | ICD-10-CM

## 2024-10-02 DIAGNOSIS — I480 Paroxysmal atrial fibrillation: Secondary | ICD-10-CM

## 2024-10-02 LAB — POCT INR: INR: 2.7 (ref 2.0–3.0)

## 2024-10-02 NOTE — Patient Instructions (Signed)
 Continue 1 tablet daily except 1/2 tablet on Mondays and Fridays.  Repeat INR in 6 weeks  Call Coumadin  Clinic with any new medications or antibiotics. [phone]

## 2024-11-13 ENCOUNTER — Ambulatory Visit
